# Patient Record
Sex: Male | Born: 1950
Health system: Southern US, Community
[De-identification: ages and names within clinical notes are randomized; demographics above are authoritative.]

## PROBLEM LIST (undated history)

## (undated) DIAGNOSIS — D0359 Melanoma in situ of other part of trunk: Secondary | ICD-10-CM

## (undated) DIAGNOSIS — M545 Low back pain, unspecified: Secondary | ICD-10-CM

## (undated) DIAGNOSIS — T7840XA Allergy, unspecified, initial encounter: Secondary | ICD-10-CM

## (undated) DIAGNOSIS — K648 Other hemorrhoids: Secondary | ICD-10-CM

## (undated) DIAGNOSIS — E039 Hypothyroidism, unspecified: Secondary | ICD-10-CM

## (undated) DIAGNOSIS — R51 Headache: Secondary | ICD-10-CM

## (undated) DIAGNOSIS — D126 Benign neoplasm of colon, unspecified: Secondary | ICD-10-CM

## (undated) DIAGNOSIS — I469 Cardiac arrest, cause unspecified: Secondary | ICD-10-CM

## (undated) DIAGNOSIS — G4730 Sleep apnea, unspecified: Secondary | ICD-10-CM

## (undated) DIAGNOSIS — F32A Depression, unspecified: Secondary | ICD-10-CM

## (undated) DIAGNOSIS — F988 Other specified behavioral and emotional disorders with onset usually occurring in childhood and adolescence: Secondary | ICD-10-CM

## (undated) DIAGNOSIS — E785 Hyperlipidemia, unspecified: Secondary | ICD-10-CM

## (undated) DIAGNOSIS — C801 Malignant (primary) neoplasm, unspecified: Secondary | ICD-10-CM

## (undated) DIAGNOSIS — L501 Idiopathic urticaria: Secondary | ICD-10-CM

## (undated) DIAGNOSIS — K219 Gastro-esophageal reflux disease without esophagitis: Secondary | ICD-10-CM

## (undated) DIAGNOSIS — Z8042 Family history of malignant neoplasm of prostate: Secondary | ICD-10-CM

## (undated) DIAGNOSIS — F0390 Unspecified dementia without behavioral disturbance: Secondary | ICD-10-CM

## (undated) DIAGNOSIS — K5790 Diverticulosis of intestine, part unspecified, without perforation or abscess without bleeding: Secondary | ICD-10-CM

## (undated) DIAGNOSIS — I493 Ventricular premature depolarization: Secondary | ICD-10-CM

## (undated) DIAGNOSIS — Z95810 Presence of automatic (implantable) cardiac defibrillator: Secondary | ICD-10-CM

## (undated) DIAGNOSIS — F329 Major depressive disorder, single episode, unspecified: Secondary | ICD-10-CM

## (undated) HISTORY — DX: Idiopathic urticaria: L50.1

## (undated) HISTORY — DX: Other hemorrhoids: K64.8

## (undated) HISTORY — PX: COLONOSCOPY: SHX174

## (undated) HISTORY — DX: Other specified behavioral and emotional disorders with onset usually occurring in childhood and adolescence: F98.8

## (undated) HISTORY — DX: Presence of automatic (implantable) cardiac defibrillator: Z95.810

## (undated) HISTORY — DX: Family history of malignant neoplasm of prostate: Z80.42

## (undated) HISTORY — DX: Diverticulosis of intestine, part unspecified, without perforation or abscess without bleeding: K57.90

## (undated) HISTORY — DX: Gastro-esophageal reflux disease without esophagitis: K21.9

## (undated) HISTORY — PX: TONSILLECTOMY: SUR1361

## (undated) HISTORY — DX: Hypothyroidism, unspecified: E03.9

## (undated) HISTORY — DX: Allergy, unspecified, initial encounter: T78.40XA

## (undated) HISTORY — DX: Hyperlipidemia, unspecified: E78.5

## (undated) HISTORY — DX: Ventricular premature depolarization: I49.3

## (undated) HISTORY — DX: Low back pain, unspecified: M54.50

## (undated) HISTORY — PX: OTHER SURGICAL HISTORY: SHX169

## (undated) HISTORY — PX: HEMORRHOID SURGERY: SHX153

## (undated) HISTORY — DX: Sleep apnea, unspecified: G47.30

## (undated) HISTORY — DX: Benign neoplasm of colon, unspecified: D12.6

## (undated) HISTORY — DX: Depression, unspecified: F32.A

## (undated) HISTORY — DX: Headache: R51

## (undated) HISTORY — DX: Melanoma in situ of other part of trunk: D03.59

## (undated) HISTORY — DX: Unspecified dementia, unspecified severity, without behavioral disturbance, psychotic disturbance, mood disturbance, and anxiety: F03.90

## (undated) HISTORY — DX: Major depressive disorder, single episode, unspecified: F32.9

## (undated) HISTORY — DX: Malignant (primary) neoplasm, unspecified: C80.1

## (undated) HISTORY — DX: Low back pain: M54.5

## (undated) HISTORY — DX: Cardiac arrest, cause unspecified: I46.9

---

## 1989-03-21 ENCOUNTER — Encounter: Payer: Self-pay | Admitting: Internal Medicine

## 1989-03-21 ENCOUNTER — Encounter: Payer: Self-pay | Admitting: Family Medicine

## 1989-03-21 ENCOUNTER — Encounter (INDEPENDENT_AMBULATORY_CARE_PROVIDER_SITE_OTHER): Payer: Self-pay | Admitting: *Deleted

## 1997-07-05 DIAGNOSIS — I469 Cardiac arrest, cause unspecified: Secondary | ICD-10-CM

## 1997-07-05 HISTORY — DX: Cardiac arrest, cause unspecified: I46.9

## 1997-10-03 ENCOUNTER — Encounter
Admission: RE | Admit: 1997-10-03 | Discharge: 1998-01-01 | Payer: Self-pay | Admitting: Physical Medicine and Rehabilitation

## 1997-11-01 ENCOUNTER — Ambulatory Visit (HOSPITAL_COMMUNITY): Admission: RE | Admit: 1997-11-01 | Discharge: 1997-11-01 | Payer: Self-pay | Admitting: Cardiology

## 1997-12-10 ENCOUNTER — Encounter (HOSPITAL_COMMUNITY): Admission: RE | Admit: 1997-12-10 | Discharge: 1998-03-10 | Payer: Self-pay | Admitting: Cardiology

## 1998-01-31 ENCOUNTER — Ambulatory Visit (HOSPITAL_COMMUNITY): Admission: RE | Admit: 1998-01-31 | Discharge: 1998-01-31 | Payer: Self-pay | Admitting: Cardiology

## 1998-03-05 ENCOUNTER — Encounter (HOSPITAL_COMMUNITY): Admission: RE | Admit: 1998-03-05 | Discharge: 1998-06-03 | Payer: Self-pay | Admitting: Cardiology

## 1998-03-25 ENCOUNTER — Ambulatory Visit (HOSPITAL_COMMUNITY): Admission: RE | Admit: 1998-03-25 | Discharge: 1998-03-25 | Payer: Self-pay | Admitting: Cardiology

## 1999-01-19 ENCOUNTER — Ambulatory Visit (HOSPITAL_COMMUNITY): Admission: RE | Admit: 1999-01-19 | Discharge: 1999-01-19 | Payer: Self-pay | Admitting: Cardiology

## 1999-01-19 ENCOUNTER — Encounter: Payer: Self-pay | Admitting: Cardiology

## 1999-10-05 ENCOUNTER — Observation Stay (HOSPITAL_COMMUNITY): Admission: AD | Admit: 1999-10-05 | Discharge: 1999-10-05 | Payer: Self-pay | Admitting: Internal Medicine

## 2001-02-08 ENCOUNTER — Ambulatory Visit (HOSPITAL_COMMUNITY): Admission: RE | Admit: 2001-02-08 | Discharge: 2001-02-08 | Payer: Self-pay | Admitting: Cardiology

## 2001-02-08 ENCOUNTER — Encounter: Payer: Self-pay | Admitting: Cardiology

## 2002-04-16 ENCOUNTER — Ambulatory Visit (HOSPITAL_BASED_OUTPATIENT_CLINIC_OR_DEPARTMENT_OTHER): Admission: RE | Admit: 2002-04-16 | Discharge: 2002-04-16 | Payer: Self-pay | Admitting: Internal Medicine

## 2002-05-16 ENCOUNTER — Ambulatory Visit (HOSPITAL_COMMUNITY): Admission: RE | Admit: 2002-05-16 | Discharge: 2002-05-16 | Payer: Self-pay | Admitting: Dermatology

## 2002-07-05 HISTORY — PX: CARDIAC DEFIBRILLATOR REMOVAL: SHX1291

## 2002-10-30 ENCOUNTER — Ambulatory Visit (HOSPITAL_COMMUNITY): Admission: RE | Admit: 2002-10-30 | Discharge: 2002-10-30 | Payer: Self-pay | Admitting: Internal Medicine

## 2002-10-30 ENCOUNTER — Encounter: Payer: Self-pay | Admitting: Internal Medicine

## 2002-12-12 ENCOUNTER — Ambulatory Visit (HOSPITAL_COMMUNITY): Admission: RE | Admit: 2002-12-12 | Discharge: 2002-12-12 | Payer: Self-pay | Admitting: Cardiology

## 2002-12-12 ENCOUNTER — Encounter: Payer: Self-pay | Admitting: Cardiology

## 2004-04-02 ENCOUNTER — Ambulatory Visit (HOSPITAL_COMMUNITY): Admission: RE | Admit: 2004-04-02 | Discharge: 2004-04-02 | Payer: Self-pay | Admitting: Cardiology

## 2004-04-02 ENCOUNTER — Encounter (INDEPENDENT_AMBULATORY_CARE_PROVIDER_SITE_OTHER): Payer: Self-pay | Admitting: Cardiology

## 2004-07-23 ENCOUNTER — Ambulatory Visit: Payer: Self-pay | Admitting: Family Medicine

## 2005-07-02 ENCOUNTER — Ambulatory Visit: Payer: Self-pay | Admitting: Family Medicine

## 2005-08-25 ENCOUNTER — Ambulatory Visit: Payer: Self-pay | Admitting: Internal Medicine

## 2005-08-27 ENCOUNTER — Ambulatory Visit: Payer: Self-pay | Admitting: Cardiology

## 2005-09-24 ENCOUNTER — Ambulatory Visit: Payer: Self-pay | Admitting: Internal Medicine

## 2006-02-02 ENCOUNTER — Encounter: Payer: Self-pay | Admitting: Family Medicine

## 2006-02-02 LAB — CONVERTED CEMR LAB: PSA: 0.22 ng/mL

## 2006-02-17 ENCOUNTER — Ambulatory Visit: Payer: Self-pay

## 2006-03-02 ENCOUNTER — Ambulatory Visit: Payer: Self-pay | Admitting: Family Medicine

## 2006-10-03 ENCOUNTER — Ambulatory Visit: Payer: Self-pay | Admitting: Internal Medicine

## 2006-10-31 ENCOUNTER — Telehealth (INDEPENDENT_AMBULATORY_CARE_PROVIDER_SITE_OTHER): Payer: Self-pay | Admitting: Internal Medicine

## 2006-11-07 ENCOUNTER — Ambulatory Visit: Payer: Self-pay

## 2006-11-14 ENCOUNTER — Encounter: Payer: Self-pay | Admitting: Family Medicine

## 2007-03-21 ENCOUNTER — Ambulatory Visit: Payer: Self-pay | Admitting: Internal Medicine

## 2007-08-22 ENCOUNTER — Encounter (INDEPENDENT_AMBULATORY_CARE_PROVIDER_SITE_OTHER): Payer: Self-pay | Admitting: Internal Medicine

## 2007-09-28 ENCOUNTER — Ambulatory Visit: Payer: Self-pay

## 2007-10-09 ENCOUNTER — Telehealth (INDEPENDENT_AMBULATORY_CARE_PROVIDER_SITE_OTHER): Payer: Self-pay | Admitting: Internal Medicine

## 2007-10-11 ENCOUNTER — Ambulatory Visit: Payer: Self-pay | Admitting: Family Medicine

## 2007-10-11 DIAGNOSIS — M545 Low back pain, unspecified: Secondary | ICD-10-CM | POA: Insufficient documentation

## 2007-10-27 ENCOUNTER — Encounter: Payer: Self-pay | Admitting: Family Medicine

## 2007-10-27 DIAGNOSIS — R519 Headache, unspecified: Secondary | ICD-10-CM | POA: Insufficient documentation

## 2007-10-27 DIAGNOSIS — I472 Ventricular tachycardia, unspecified: Secondary | ICD-10-CM | POA: Insufficient documentation

## 2007-10-27 DIAGNOSIS — E785 Hyperlipidemia, unspecified: Secondary | ICD-10-CM | POA: Insufficient documentation

## 2007-10-27 DIAGNOSIS — G4730 Sleep apnea, unspecified: Secondary | ICD-10-CM | POA: Insufficient documentation

## 2007-10-27 DIAGNOSIS — F329 Major depressive disorder, single episode, unspecified: Secondary | ICD-10-CM | POA: Insufficient documentation

## 2007-10-27 DIAGNOSIS — F988 Other specified behavioral and emotional disorders with onset usually occurring in childhood and adolescence: Secondary | ICD-10-CM | POA: Insufficient documentation

## 2007-10-27 DIAGNOSIS — R51 Headache: Secondary | ICD-10-CM | POA: Insufficient documentation

## 2007-10-30 ENCOUNTER — Ambulatory Visit: Payer: Self-pay | Admitting: Family Medicine

## 2007-11-03 DIAGNOSIS — 419620001 Death: Secondary | SNOMED CT | POA: Insufficient documentation

## 2007-11-03 DEATH — deceased

## 2007-11-28 ENCOUNTER — Telehealth (INDEPENDENT_AMBULATORY_CARE_PROVIDER_SITE_OTHER): Payer: Self-pay | Admitting: *Deleted

## 2007-12-04 ENCOUNTER — Encounter: Payer: Self-pay | Admitting: Family Medicine

## 2008-01-09 ENCOUNTER — Encounter: Payer: Self-pay | Admitting: Family Medicine

## 2008-01-09 ENCOUNTER — Telehealth: Payer: Self-pay | Admitting: Family Medicine

## 2008-01-15 ENCOUNTER — Ambulatory Visit: Payer: Self-pay | Admitting: Internal Medicine

## 2008-01-29 ENCOUNTER — Encounter (INDEPENDENT_AMBULATORY_CARE_PROVIDER_SITE_OTHER): Payer: Self-pay | Admitting: *Deleted

## 2008-01-29 ENCOUNTER — Ambulatory Visit (HOSPITAL_COMMUNITY): Admission: RE | Admit: 2008-01-29 | Discharge: 2008-01-29 | Payer: Self-pay | Admitting: Surgery

## 2008-01-29 ENCOUNTER — Encounter (INDEPENDENT_AMBULATORY_CARE_PROVIDER_SITE_OTHER): Payer: Self-pay | Admitting: Surgery

## 2008-02-03 ENCOUNTER — Ambulatory Visit: Payer: Self-pay | Admitting: *Deleted

## 2008-02-03 DIAGNOSIS — L255 Unspecified contact dermatitis due to plants, except food: Secondary | ICD-10-CM | POA: Insufficient documentation

## 2008-02-03 DIAGNOSIS — L501 Idiopathic urticaria: Secondary | ICD-10-CM | POA: Insufficient documentation

## 2008-02-03 DIAGNOSIS — I10 Essential (primary) hypertension: Secondary | ICD-10-CM | POA: Insufficient documentation

## 2008-02-23 ENCOUNTER — Telehealth: Payer: Self-pay | Admitting: Family Medicine

## 2008-03-06 ENCOUNTER — Encounter: Payer: Self-pay | Admitting: Family Medicine

## 2008-03-22 ENCOUNTER — Telehealth: Payer: Self-pay | Admitting: Family Medicine

## 2008-05-03 ENCOUNTER — Encounter: Payer: Self-pay | Admitting: Family Medicine

## 2008-05-03 ENCOUNTER — Telehealth: Payer: Self-pay | Admitting: Family Medicine

## 2008-06-14 ENCOUNTER — Telehealth: Payer: Self-pay | Admitting: Family Medicine

## 2008-07-23 ENCOUNTER — Telehealth: Payer: Self-pay | Admitting: Family Medicine

## 2008-07-25 ENCOUNTER — Telehealth: Payer: Self-pay | Admitting: Family Medicine

## 2008-07-29 ENCOUNTER — Encounter: Payer: Self-pay | Admitting: Family Medicine

## 2008-08-28 ENCOUNTER — Encounter: Payer: Self-pay | Admitting: Internal Medicine

## 2008-09-06 ENCOUNTER — Telehealth: Payer: Self-pay | Admitting: Family Medicine

## 2008-10-03 ENCOUNTER — Telehealth: Payer: Self-pay | Admitting: Family Medicine

## 2008-10-14 ENCOUNTER — Ambulatory Visit: Payer: Self-pay | Admitting: Internal Medicine

## 2008-10-14 ENCOUNTER — Encounter: Payer: Self-pay | Admitting: Internal Medicine

## 2008-10-16 ENCOUNTER — Encounter: Payer: Self-pay | Admitting: Family Medicine

## 2008-10-30 ENCOUNTER — Encounter: Payer: Self-pay | Admitting: Family Medicine

## 2008-10-30 LAB — CONVERTED CEMR LAB
HDL: 60 mg/dL
LDL Cholesterol: 93 mg/dL
Triglycerides: 55 mg/dL

## 2008-11-04 ENCOUNTER — Telehealth: Payer: Self-pay | Admitting: Family Medicine

## 2008-11-18 ENCOUNTER — Ambulatory Visit: Payer: Self-pay | Admitting: Family Medicine

## 2008-11-18 DIAGNOSIS — E039 Hypothyroidism, unspecified: Secondary | ICD-10-CM | POA: Insufficient documentation

## 2008-11-20 ENCOUNTER — Ambulatory Visit: Payer: Self-pay | Admitting: Family Medicine

## 2008-11-20 LAB — CONVERTED CEMR LAB
Free T4: 0.8 ng/dL (ref 0.6–1.6)
T3, Free: 3.5 pg/mL (ref 2.3–4.2)
TSH: 6.34 microintl units/mL — ABNORMAL HIGH (ref 0.35–5.50)

## 2008-12-20 ENCOUNTER — Telehealth: Payer: Self-pay | Admitting: Family Medicine

## 2008-12-24 ENCOUNTER — Telehealth: Payer: Self-pay | Admitting: Family Medicine

## 2009-01-28 ENCOUNTER — Telehealth: Payer: Self-pay | Admitting: Family Medicine

## 2009-02-03 ENCOUNTER — Ambulatory Visit: Payer: Self-pay | Admitting: Internal Medicine

## 2009-02-03 DIAGNOSIS — Z95810 Presence of automatic (implantable) cardiac defibrillator: Secondary | ICD-10-CM | POA: Insufficient documentation

## 2009-03-11 ENCOUNTER — Telehealth: Payer: Self-pay | Admitting: Family Medicine

## 2009-04-14 ENCOUNTER — Telehealth: Payer: Self-pay | Admitting: Family Medicine

## 2009-05-19 ENCOUNTER — Telehealth: Payer: Self-pay | Admitting: Family Medicine

## 2009-06-03 ENCOUNTER — Encounter: Payer: Self-pay | Admitting: Family Medicine

## 2009-06-03 ENCOUNTER — Encounter: Payer: Self-pay | Admitting: Internal Medicine

## 2009-06-05 ENCOUNTER — Encounter (INDEPENDENT_AMBULATORY_CARE_PROVIDER_SITE_OTHER): Payer: Self-pay | Admitting: *Deleted

## 2009-06-05 LAB — CONVERTED CEMR LAB
Free T4: 1.02 ng/dL
T3, Free: 3.5 pg/mL
T3, Total: 113 ng/dL
T4, Total: 5.8 ug/dL
TSH: 6.36 microintl units/mL — ABNORMAL HIGH

## 2009-06-14 ENCOUNTER — Encounter: Payer: Self-pay | Admitting: Internal Medicine

## 2009-06-17 ENCOUNTER — Telehealth: Payer: Self-pay | Admitting: Internal Medicine

## 2009-07-08 ENCOUNTER — Telehealth: Payer: Self-pay | Admitting: Family Medicine

## 2009-07-17 ENCOUNTER — Ambulatory Visit: Payer: Self-pay | Admitting: Internal Medicine

## 2009-07-28 ENCOUNTER — Telehealth: Payer: Self-pay | Admitting: Family Medicine

## 2009-08-14 ENCOUNTER — Telehealth: Payer: Self-pay | Admitting: Family Medicine

## 2009-08-20 ENCOUNTER — Telehealth: Payer: Self-pay | Admitting: Family Medicine

## 2009-08-21 ENCOUNTER — Telehealth: Payer: Self-pay | Admitting: Family Medicine

## 2009-08-25 ENCOUNTER — Encounter: Payer: Self-pay | Admitting: Family Medicine

## 2009-09-29 ENCOUNTER — Telehealth: Payer: Self-pay | Admitting: Family Medicine

## 2009-10-21 ENCOUNTER — Encounter: Payer: Self-pay | Admitting: Internal Medicine

## 2009-10-21 ENCOUNTER — Ambulatory Visit: Payer: Self-pay | Admitting: Cardiovascular Disease

## 2009-10-23 ENCOUNTER — Telehealth: Payer: Self-pay | Admitting: Family Medicine

## 2009-11-05 ENCOUNTER — Encounter: Payer: Self-pay | Admitting: Family Medicine

## 2009-11-05 ENCOUNTER — Encounter: Payer: Self-pay | Admitting: Cardiovascular Disease

## 2009-11-28 ENCOUNTER — Telehealth: Payer: Self-pay | Admitting: Family Medicine

## 2009-12-10 ENCOUNTER — Telehealth: Payer: Self-pay | Admitting: Family Medicine

## 2009-12-16 ENCOUNTER — Ambulatory Visit: Payer: Self-pay | Admitting: Family Medicine

## 2009-12-17 ENCOUNTER — Telehealth: Payer: Self-pay | Admitting: Family Medicine

## 2009-12-17 LAB — CONVERTED CEMR LAB
ALT: 31 units/L (ref 0–53)
AST: 25 units/L (ref 0–37)
Albumin: 4.2 g/dL (ref 3.5–5.2)
Alkaline Phosphatase: 61 units/L (ref 39–117)
Bilirubin, Direct: 0.1 mg/dL (ref 0.0–0.3)
Cholesterol: 217 mg/dL — ABNORMAL HIGH (ref 0–200)
Direct LDL: 146.2 mg/dL
Free T4: 0.83 ng/dL (ref 0.60–1.60)
HDL: 52.4 mg/dL (ref >39.00)
T3, Free: 3.1 pg/mL (ref 2.3–4.2)
TSH: 8.11 microintl units/mL — ABNORMAL HIGH (ref 0.35–5.50)
Total Bilirubin: 0.3 mg/dL (ref 0.3–1.2)
Total CHOL/HDL Ratio: 4
Total Protein: 6.8 g/dL (ref 6.0–8.3)
Triglycerides: 128 mg/dL (ref 0.0–149.0)
VLDL: 25.6 mg/dL (ref 0.0–40.0)

## 2009-12-22 ENCOUNTER — Ambulatory Visit: Payer: Self-pay | Admitting: Cardiovascular Disease

## 2010-01-19 ENCOUNTER — Ambulatory Visit: Payer: Self-pay

## 2010-01-19 ENCOUNTER — Encounter: Payer: Self-pay | Admitting: Family Medicine

## 2010-02-04 ENCOUNTER — Telehealth: Payer: Self-pay | Admitting: Family Medicine

## 2010-03-06 ENCOUNTER — Telehealth: Payer: Self-pay | Admitting: Family Medicine

## 2010-03-12 ENCOUNTER — Ambulatory Visit: Payer: Self-pay | Admitting: Family Medicine

## 2010-03-14 LAB — CONVERTED CEMR LAB
ALT: 41 units/L (ref 0–53)
AST: 33 units/L (ref 0–37)
Albumin: 4.2 g/dL (ref 3.5–5.2)
Alkaline Phosphatase: 54 units/L (ref 39–117)
BUN: 13 mg/dL (ref 6–23)
Bilirubin, Direct: 0.1 mg/dL (ref 0.0–0.3)
CO2: 28 meq/L (ref 19–32)
Calcium: 9.3 mg/dL (ref 8.4–10.5)
Chloride: 104 meq/L (ref 96–112)
Cholesterol: 201 mg/dL — ABNORMAL HIGH (ref 0–200)
Creatinine, Ser: 0.9 mg/dL (ref 0.4–1.5)
Direct LDL: 134.4 mg/dL
Free T4: 0.82 ng/dL (ref 0.60–1.60)
GFR calc non Af Amer: 95.91 mL/min (ref >60)
Glucose, Bld: 90 mg/dL (ref 70–99)
HDL: 50.4 mg/dL (ref >39.00)
PSA: 0.22 ng/mL (ref 0.10–4.00)
Potassium: 4.2 meq/L (ref 3.5–5.1)
Sodium: 139 meq/L (ref 135–145)
T3, Free: 2.9 pg/mL (ref 2.3–4.2)
TSH: 5.17 microintl units/mL (ref 0.35–5.50)
Total Bilirubin: 0.7 mg/dL (ref 0.3–1.2)
Total CHOL/HDL Ratio: 4
Total Protein: 6.7 g/dL (ref 6.0–8.3)
Triglycerides: 136 mg/dL (ref 0.0–149.0)
VLDL: 27.2 mg/dL (ref 0.0–40.0)

## 2010-03-17 ENCOUNTER — Ambulatory Visit: Payer: Self-pay | Admitting: Family Medicine

## 2010-04-08 ENCOUNTER — Encounter: Payer: Self-pay | Admitting: Internal Medicine

## 2010-04-08 ENCOUNTER — Ambulatory Visit: Payer: Self-pay

## 2010-04-13 ENCOUNTER — Telehealth: Payer: Self-pay | Admitting: Family Medicine

## 2010-04-22 ENCOUNTER — Telehealth: Payer: Self-pay | Admitting: Family Medicine

## 2010-05-12 ENCOUNTER — Telehealth: Payer: Self-pay | Admitting: Family Medicine

## 2010-05-21 ENCOUNTER — Telehealth: Payer: Self-pay | Admitting: Family Medicine

## 2010-06-02 ENCOUNTER — Telehealth: Payer: Self-pay | Admitting: Internal Medicine

## 2010-06-05 ENCOUNTER — Encounter: Payer: Self-pay | Admitting: Internal Medicine

## 2010-06-05 ENCOUNTER — Ambulatory Visit: Payer: Self-pay | Admitting: Cardiovascular Disease

## 2010-06-09 ENCOUNTER — Ambulatory Visit: Payer: Self-pay | Admitting: Internal Medicine

## 2010-06-10 ENCOUNTER — Encounter: Payer: Self-pay | Admitting: Internal Medicine

## 2010-06-12 ENCOUNTER — Ambulatory Visit: Payer: Self-pay | Admitting: Family Medicine

## 2010-06-23 ENCOUNTER — Telehealth: Payer: Self-pay | Admitting: Family Medicine

## 2010-06-24 ENCOUNTER — Encounter: Payer: Self-pay | Admitting: Internal Medicine

## 2010-06-24 ENCOUNTER — Ambulatory Visit: Payer: Self-pay | Admitting: Internal Medicine

## 2010-06-25 LAB — CONVERTED CEMR LAB
BUN: 13 mg/dL (ref 6–23)
Basophils Absolute: 0 10*3/uL (ref 0.0–0.1)
Basophils Relative: 0 % (ref 0–1)
CO2: 26 meq/L (ref 19–32)
Calcium: 9.2 mg/dL (ref 8.4–10.5)
Chloride: 103 meq/L (ref 96–112)
Creatinine, Ser: 1.4 mg/dL (ref 0.40–1.50)
Eosinophils Absolute: 0.1 10*3/uL (ref 0.0–0.7)
Eosinophils Relative: 2 % (ref 0–5)
Glucose, Bld: 117 mg/dL — ABNORMAL HIGH (ref 70–99)
HCT: 40.1 % (ref 39.0–52.0)
Hemoglobin: 14 g/dL (ref 13.0–17.0)
INR: 1.05 (ref <1.50)
Lymphocytes Relative: 46 % (ref 12–46)
Lymphs Abs: 3.3 10*3/uL (ref 0.7–4.0)
MCHC: 34.9 g/dL (ref 30.0–36.0)
MCV: 89.5 fL (ref 78.0–100.0)
Monocytes Absolute: 0.7 10*3/uL (ref 0.1–1.0)
Monocytes Relative: 9 % (ref 3–12)
Neutro Abs: 3 10*3/uL (ref 1.7–7.7)
Neutrophils Relative %: 42 % — ABNORMAL LOW (ref 43–77)
Platelets: 321 10*3/uL (ref 150–400)
Potassium: 3.8 meq/L (ref 3.5–5.3)
Prothrombin Time: 13.9 s (ref 11.6–15.2)
RBC: 4.48 M/uL (ref 4.22–5.81)
RDW: 12.5 % (ref 11.5–15.5)
Sodium: 140 meq/L (ref 135–145)
WBC: 7.2 10*3/uL (ref 4.0–10.5)
aPTT: 29 s (ref 24–37)

## 2010-07-01 ENCOUNTER — Ambulatory Visit (HOSPITAL_COMMUNITY)
Admission: RE | Admit: 2010-07-01 | Discharge: 2010-07-01 | Payer: Self-pay | Source: Home / Self Care | Attending: Internal Medicine | Admitting: Internal Medicine

## 2010-07-01 HISTORY — PX: CARDIAC DEFIBRILLATOR PLACEMENT: SHX171

## 2010-07-02 ENCOUNTER — Encounter: Payer: Self-pay | Admitting: Internal Medicine

## 2010-07-10 ENCOUNTER — Encounter: Payer: Self-pay | Admitting: Internal Medicine

## 2010-07-10 ENCOUNTER — Ambulatory Visit: Admission: RE | Admit: 2010-07-10 | Discharge: 2010-07-10 | Payer: Self-pay | Source: Home / Self Care

## 2010-07-17 ENCOUNTER — Telehealth: Payer: Self-pay | Admitting: Family Medicine

## 2010-07-21 ENCOUNTER — Other Ambulatory Visit: Payer: Self-pay | Admitting: Family Medicine

## 2010-07-21 ENCOUNTER — Ambulatory Visit
Admission: RE | Admit: 2010-07-21 | Discharge: 2010-07-21 | Payer: Self-pay | Source: Home / Self Care | Attending: Family Medicine | Admitting: Family Medicine

## 2010-07-21 DIAGNOSIS — K921 Melena: Secondary | ICD-10-CM | POA: Insufficient documentation

## 2010-07-21 LAB — FECAL OCCULT BLOOD, IMMUNOCHEMICAL: Fecal Occult Bld: POSITIVE

## 2010-07-23 ENCOUNTER — Telehealth: Payer: Self-pay | Admitting: Family Medicine

## 2010-07-24 ENCOUNTER — Encounter: Payer: Self-pay | Admitting: Gastroenterology

## 2010-08-04 NOTE — Cardiovascular Report (Signed)
Summary: Office Visit   Office Visit   Imported By: Roderic Ovens 10/22/2009 16:36:56  _____________________________________________________________________  External Attachment:    Type:   Image     Comment:   External Document

## 2010-08-04 NOTE — Progress Notes (Signed)
Summary: needs refill on adderal  Phone Note Refill Request Call back at Home Phone [phone] Message from:  Patient  Refills Requested: Medication #1:  ADDERALL 10 MG  TABS Take 1 tablet by mouth two times a day Please call when ready.  Initial call taken by: Lowella Petties CMA,  January 28, 2009 9:08 AM    Prescriptions: ADDERALL 10 MG  TABS (AMPHETAMINE-DEXTROAMPHETAMINE) Take 1 tablet by mouth two times a day  #60 x 0   Entered and Authorized by:   Kerby Nora MD   Signed by:   Kerby Nora MD on 01/28/2009   Method used:   Print then Give to Patient   RxID:   [id number]   Appended Document: needs refill on adderal Patient Advised. Prescription left at front desk.

## 2010-08-04 NOTE — Progress Notes (Signed)
Summary: Rx Skelaxin  Phone Note Call from Patient Call back at [phone]   Caller: Patient Call For: Everrett Coombe, FNP Summary of Call: Pt called to request a new script for Skelaxin.  Pt has an old rx that you gave him back in 2007 and was getting ready to take this over the weekend and realized that it had expired.  Pt stated that he takes this as needed when his back bothers him..  Pt was advised that he would probably need an appt. to get a new rx for this since you have not seem him for this problem for several years.  Pt declined appt and insisted that I send this note back to you.  [facility] Initial call taken by: Sydell Axon,  October 09, 2007 11:03 AM  Follow-up for Phone Call        please call him, I have not seen himin over a year and need to before refill   ..................................................................Marland KitchenBillie-Lynn Tyler Deis FNP  October 09, 2007 1:30 PM   Additional Follow-up for Phone Call Additional follow up Details #1::        msg left on home answering machine to schedule appt for med refill. Additional Follow-up by: Cooper Render,  October 09, 2007 3:17 PM

## 2010-08-04 NOTE — Progress Notes (Signed)
Summary: adderall   Phone Note Refill Request Call back at Home Phone [phone] Message from:  Patient on February 04, 2010 8:37 AM  Refills Requested: Medication #1:  ADDERALL 10 MG  TABS Take 1 tablet by mouth two times a day  Method Requested: Pick up at Office Initial call taken by: Melody Comas,  February 04, 2010 8:37 AM  Follow-up for Phone Call        patient advised rx ready for pick up Follow-up by: Benny Lennert CMA Duncan Dull),  February 04, 2010 2:39 PM    Prescriptions: ADDERALL 10 MG  TABS (AMPHETAMINE-DEXTROAMPHETAMINE) Take 1 tablet by mouth two times a day  #60 x 0   Entered and Authorized by:   Kerby Nora MD   Signed by:   Kerby Nora MD on 02/04/2010   Method used:   Print then Give to Patient   RxID:   [id number]

## 2010-08-04 NOTE — Procedures (Signed)
Summary: Gastroenterology-FLEX SIG/PATH  Gastroenterology-FLEX SIG/PATH   Imported By: Hortense Ramal CMA 11/22/2008 17:45:14  _____________________________________________________________________  External Attachment:    Type:   Image     Comment:   External Document

## 2010-08-04 NOTE — Cardiovascular Report (Signed)
Summary: Office Visit   Office Visit   Imported By: Roderic Ovens 05/04/2010 13:10:13  _____________________________________________________________________  External Attachment:    Type:   Image     Comment:   External Document

## 2010-08-04 NOTE — Progress Notes (Signed)
Summary: pt pacer is beeping   Phone Note Call from Patient Call back at Home Phone [phone]   Caller: Patient Reason for Call: Talk to Nurse, Talk to Doctor Summary of Call: pt pacer is beeping Initial call taken by: Omer Jack,  June 02, 2010 2:45 PM

## 2010-08-04 NOTE — Cardiovascular Report (Signed)
Summary: Office Visit   Office Visit   Imported By: Roderic Ovens 01/22/2010 15:19:36  _____________________________________________________________________  External Attachment:    Type:   Image     Comment:   External Document

## 2010-08-04 NOTE — Progress Notes (Signed)
Summary: prior Berkley Harvey is needed for adderal  Phone Note From Pharmacy Call back at [facility] Phone [phone] Call back at [phone]   [facility] Summary of Call: Prior auth is needed for adderal, form is on your desk. Can this be done Friday because pt is going out of town next week. Call when done can leave message with wife [name].  Initial call taken by: Lowella Petties,  July 25, 2008 2:04 PM

## 2010-08-04 NOTE — Progress Notes (Signed)
Summary: ADDERALL RX Requested  Phone Note Refill Request Call back at Home Phone [phone] Message from:  Patient on March 22, 2008 9:19 AM  Refills Requested: Medication #1:  ADDERALL 10 MG  TABS Take 1 tablet by mouth two times a day Please call pt when ready for pick up.    Method Requested: Pick up at Office Initial call taken by: Mickle Asper,  March 22, 2008 9:20 AM      Prescriptions: ADDERALL 10 MG  TABS (AMPHETAMINE-DEXTROAMPHETAMINE) Take 1 tablet by mouth two times a day  #60 x 0   Entered and Authorized by:   Kerby Nora MD   Signed by:   Kerby Nora MD on 03/22/2008   Method used:   Print then Give to Patient   RxID:   [id number]

## 2010-08-04 NOTE — Medication Information (Signed)
Summary: [facility] Prior Auth Approval for Adderall from 07/29/08-1/25/  [facility] Prior Auth Approval for Adderall from 07/29/08-07/29/09   Imported By: Beau Fanny 07/30/2008 10:04:50  _____________________________________________________________________  External Attachment:    Type:   Image     Comment:   External Document

## 2010-08-04 NOTE — Progress Notes (Signed)
Summary: fexofenadine refill  Phone Note Refill Request Message from:  Fax from Pharmacy on Nov 28, 2007 11:02 AM  Refills Requested: Medication #1:  FEXOFENADINE HCL 180 MG TABS Take 1 tablet by mouth once a day [phone]  Initial call taken by: Doristine Devoid,  Nov 28, 2007 11:02 AM      Prescriptions: FEXOFENADINE HCL 180 MG TABS (FEXOFENADINE HCL) Take 1 tablet by mouth once a day  #30 x 3   Entered by:   Doristine Devoid   Authorized by:   Gildardo Griffes FNP   Signed by:   Doristine Devoid on 11/28/2007   Method used:   Electronically [facility] to ...       [facility]  [address]  [id number]       [address]       [place], [place]  [address]       Ph: [phone] or [phone]       Fax: [phone]   RxID:   [id number]

## 2010-08-04 NOTE — Progress Notes (Signed)
Summary: Adderall authorization  Phone Note Call from Patient Call back at [phone]   Caller: Patient Call For: Kerby Nora MD Summary of Call: Patient came into office today because he wanted to know the status of his prior authorization.  He had a rep from [employer] on his cell phone and asked if I would speak to them.  I spoke with the rep and I advised her that the authorization form for Adderall had been faxed back to [employer] on 08/15/2009.  She advised me that [employer] generally faxes the paperwork to [facility] but it may take 5-7 business days for [employer] to get the paperwork, she advised that if I call [employer] and give the information over the phone the authorization can be determined right away.  Called [phone] and spoke to a rep who advised me that since questions 5 & 6 were not answered on the form then there was nothing else that I could do.  The case is now in the appeals department and until they make there decision our hands are basically tied.  In the mean time the patient has been without his medication for 2 weeks.  He is a very pleasant gentleman and I explained to him the situation.  He is willing to work with [place] and do whatever he needs to do to get this taken care of.  Please advise. Initial call taken by: Linde Gillis CMA Duncan Dull),  August 20, 2009 1:42 PM  Follow-up for Phone Call        He can pay for med out of pocket to get while in appeal.  I answered the handout to the best of my ability.Marland Kitchengiven I did not initiate this medicaiton..neuro did.  Follow-up by: Kerby Nora MD,  August 20, 2009 3:06 PM  Additional Follow-up for Phone Call Additional follow up Details #1::        Patient advised as instructed.  He is in the process of setting up an appt to see Dr. Sandria Manly who gave him the Rx originally.  He will speak with them at his appt to see if they can do anything to help.  Advised that if our office can do anything to assist then please let us know. Additional Follow-up  by: Linde Gillis CMA Duncan Dull),  August 20, 2009 4:36 PM

## 2010-08-04 NOTE — Letter (Signed)
Summary: [facility] Office Visit Note   [facility] Office Visit Note   Imported By: Roderic Ovens 12/29/2009 15:08:53  _____________________________________________________________________  External Attachment:    Type:   Image     Comment:   External Document

## 2010-08-04 NOTE — Progress Notes (Signed)
Summary: adderall  Phone Note Refill Request Call back at Home Phone [phone] Message from:  Patient on July 28, 2009 10:28 AM  Refills Requested: Medication #1:  ADDERALL 10 MG  TABS Take 1 tablet by mouth two times a day   Supply Requested: 1 month Patient wants to pick up to day   Method Requested: Pick up at Office Initial call taken by: Benny Lennert CMA Duncan Dull),  July 28, 2009 10:28 AM Caller: Patient Call For: Kerby Nora MD Summary of Call: Patient request refill on Adderall. Patient wants to pick up to day  Follow-up for Phone Call        In my box for pick up. Follow-up by: Ruthe Mannan MD,  July 28, 2009 10:44 AM    Prescriptions: ADDERALL 10 MG  TABS (AMPHETAMINE-DEXTROAMPHETAMINE) Take 1 tablet by mouth two times a day  #60 x 0   Entered and Authorized by:   Ruthe Mannan MD   Signed by:   Ruthe Mannan MD on 07/28/2009   Method used:   Print then Give to Patient   RxID:   [id number]   Appended Document: adderall patient advised rx ready for pick up

## 2010-08-04 NOTE — Miscellaneous (Signed)
   Clinical Lists Changes  Observations: Added new observation of T3 FREE: 3.5 pg/mL (06/05/2009 15:02) Added new observation of T3, TOTAL: 113 ng/dL (16/04/9603 54:09) Added new observation of TSH: 6.360 microintl units/mL (06/05/2009 15:02) Added new observation of T4, FREE: 1.02 ng/dL (81/19/1478 29:56) Added new observation of T4, TOTAL: 5.8 mcg/dL (21/30/8657 84:69)

## 2010-08-04 NOTE — Progress Notes (Signed)
Summary: Stool cards  Phone Note Call from Patient Call back at Home Phone [phone]   Caller: Patient Call For: Kerby Nora MD Summary of Call: Patient has decided to do stool cards rather than colonoscopy.  Will  pick up hemoccult set when he picks up Rx. Initial call taken by: Delilah Shan,  December 20, 2008 2:33 PM  Follow-up for Phone Call        order printed on computer for i[employer]. Follow-up by: Kerby Nora MD,  December 20, 2008 3:05 PM  Additional Follow-up for Phone Call Additional follow up Details #1::        Received. Additional Follow-up by: Delilah Shan,  December 20, 2008 3:11 PM  New Problems: SPECIAL SCREENING FOR MALIGNANT NEOPLASMS COLON (ICD-V76.51)   New Problems: SPECIAL SCREENING FOR MALIGNANT NEOPLASMS COLON (ICD-V76.51)

## 2010-08-04 NOTE — Assessment & Plan Note (Signed)
Summary: f21m      Allergies Added:   Visit Type:  Follow-up Referring Provider:  Randolm Idol Primary Provider:  Dr. Ermalene Searing  CC:  none.  History of Present Illness: This is a 60 year old gentleman presenting for follow-up evaluation. The patient is followed by Dr. [name] after treatment with an ICD in 1999. 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 This occurred in the setting of antidysrhythmic therapy for symptomatic PVCs suppression.  Cardiac catheterization at the time of his arrest demonstrated normal coronary arteries. Echocardiogram performed in 2010 showed normal left ventricular function with an LVEF of 60%.  The pt was started on low-dose Toprol XL at the time of his last visit because of tachycardia. He has tolerated this well. The patient denies chest pain, dyspnea, orthopnea, PND, edema, palpitations, lightheadedness, or syncope.   Current Medications (verified): 1)  Fexofenadine Hcl 180 Mg Tabs (Fexofenadine Hcl) .... Take 1 Tablet By Mouth Once A Day 2)  Aricept 5 Mg  Tabs (Donepezil Hcl) .... Take 1 Tab By Mouth At Bedtime 3)  Crestor 10 Mg  Tabs (Rosuvastatin Calcium) .... Take 1 Tab By Mouth Every Day At Bedtime 4)  Taztia Xt 300 Mg  Cp24 (Diltiazem Hcl Er Beads) .... Take 1 Tablet By Mouth Every Morning 5)  Adderall 10 Mg  Tabs (Amphetamine-Dextroamphetamine) .... Take 1 Tablet By Mouth Two Times A Day 6)  Multivitamins   Tabs (Multiple Vitamin) .... Take 1 Tablet By Mouth Once A Day 7)  Ginkgo Biloba   Extr (Ginkgo Biloba) .... Take 1 Tablet By Mouth Two Times A Day 8)  Calcium Carbonate-Vitamin D 600-400 Mg-Unit  Tabs (Calcium Carbonate-Vitamin D) .... Take 1 Tablet By Mouth Once in A While 9)  Fish Oil 1000 Mg  Caps (Omega-3 Fatty Acids) .... Take 1 Tablet By Mouth Every Morning 10)  Osteo Bi-Flex Triple Strength  Tabs (Misc Natural Products) .... 1/2 Tablet Two Times A Day 11)  Skelaxin 800 Mg  Tabs (Metaxalone)  .Marland Kitchen.. 1 Tab By Mouth Three Times A Day As Needed Pain/muscle Spasm 12)  Aspirin 325 Mg  Tabs (Aspirin) .... Take 1 Tablet By Mouth Once A Day 13)  Focus Factor 692mg  .... 1 Tablet Two Times A Day 14)  Vitamin C 500 Mg  Tabs (Ascorbic Acid) .... Take One Tablet By Mouth Once Daily. 15)  Vitamin E 400 Unit Caps (Vitamin E) .... Once Daily 16)  Metoprolol Succinate 25 Mg Xr24h-Tab (Metoprolol Succinate) .... Take One Tablet By Mouth Daily  Allergies (verified): 1)  Penicillin G Potassium (Penicillin G Potassium)  Past History:  Past medical history reviewed for relevance to current acute and chronic problems.  Past Medical History: Reviewed history from 12/13/2008 and no changes required. 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 HEENT: normal Neck: normal carotid upstrokes without bruits, JVP normal Lungs: CTA CV: RRR without murmur or gallop Abd: soft, NT, positive BS, no bruit, no organomegaly Ext: no clubbing, cyanosis, or edema. peripheral pulses 2+ and equal Skin: warm and dry without rash    EKG  Procedure date:  12/22/2009  Findings:      NSR, HR 85 bpm, within normal limits.   ICD  Specifications Following MD:  Sherryl Manges, MD     ICD Vendor:  [facility] [employer]     ICD Model Number:  [phone]     ICD Serial Number:  [id number] ICD DOI:  10/30/2002     ICD Implanting MD:  Sherryl Manges, MD  Lead 1:    Location: RV     DOI: 08/08/1997     Model #: [id number]     Serial #: [id number]     Status: active  Indications::  VT   ICD Follow Up ICD Dependent:  No      Episodes Coumadin:  No  Brady Parameters Mode VVI     Lower Rate Limit:  50      Tachy Zones VF:  210     VT:  185     Impression & Recommendations:  Problem # 1:  UNSPECIFIED TACHYCARDIA (ICD-785.0) The patient is doing well without cardiac symptoms at present. Recommend continue current medical therapy and f/u in 6 months.  Orders: EKG w/ Interpretation (93000)  Problem # 2:  HYPERLIPIDEMIA (ICD-272.4) Recent lipids showed an LDL of 146 mg/dL. We increased his Crestor to daily dosing (was taking every other day) and will recheck lipids in 12 weeks.  His updated medication list for this problem includes:    Crestor 10 Mg Tabs (Rosuvastatin calcium) .Marland Kitchen... Take 1 tab by mouth every day at bedtime  CHOL: 217 (12/16/2009)   LDL: 93 (10/30/2008)   HDL: 52.40 (12/16/2009)   TG: 128.0 (12/16/2009)  Patient Instructions: 1)  Your physician recommends that you continue on your current medications as directed. Please refer to the Current Medication list given to you today. 2)  Your physician wants you to follow-up in:   6 MONTHS with Dr Excell Seltzer. You will receive a reminder letter in the mail two months in advance. If you don't receive a letter, please call our office to schedule the follow-up appointment. 3)  Please have a lipid and liver profile drawn in September with your PCP.  4)  Your physician recommends that you schedule a follow-up appointment: for device check

## 2010-08-04 NOTE — Procedures (Signed)
Summary: pacer beeping/lg    Allergies: 1)  Penicillin G Potassium (Penicillin G Potassium)   ICD Specifications Following MD:  Sherryl Manges, MD     ICD Vendor:  [facility] [employer]     ICD Model Number:  [phone]     ICD Serial Number:  [id number] ICD DOI:  10/30/2002     ICD Implanting MD:  Sherryl Manges, MD  Lead 1:    Location: RV     DOI: 08/08/1997     Model #: [id number]     Serial #: [id number]     Status: active  Indications::  VT   ICD Follow Up Remote Check?  No Battery Voltage:  2.50 V     Charge Time:  14.3 seconds     Battery Est. Longevity:  ERI Underlying rhythm:  SR ICD Dependent:  No      Episodes Coumadin:  No  Brady Parameters Mode VVI     Lower Rate Limit:  50      Tachy Zones VF:  210     VT:  185     Tech Comments:  Battery @ [facility] 05/30/10.  ROV 12/6 with Dr. [name] to discuss change out. Altha Harm, LPN  June 05, 2010 12:32 PM

## 2010-08-04 NOTE — Assessment & Plan Note (Signed)
Summary: 3 WEEK FOLLOW UP / LFW   Vital Signs:  Patient Profile:   60 Years Old Male Weight:      178.13 pounds Temp:     98.2 degrees F oral Pulse rate:   80 / minute Pulse rhythm:   regular BP sitting:   122 / 84  (left arm) Cuff size:   regular  Vitals Entered By: Delilah Shan (October 30, 2007 9:33 AM)                 Chief Complaint:  3 week follow up.  History of Present Illness: hemmorhoid/rectal mass protruding present for years, somewhat irritated more problematic in past few months, better now changes in size, larger when bothering himmore. drops of brbpr on toilet tissue    Current Allergies (reviewed today): PENICILLIN G POTASSIUM (PENICILLIN G POTASSIUM)  Past Medical History:    Reviewed history from 10/07/2007 and no changes required:       MELANOMA, CHEST (ICD-172.9)       ADD (ICD-314.00)       SLEEP APNEA (ICD-780.57)       DEMENTIA, POST RESUSCITATIVE (ICD-294.8)       SUDDEN DEATH (ICD-798.1)       CARDIAC ARRHYTHMIA (ICD-427.9)       HYPOTHYROIDISM (ICD-244.9)       HYPERLIPIDEMIA (ICD-272.4)       HEADACHE (ICD-784.0)       DEPRESSION (ICD-311)       LOW BACK PAIN SYNDROME (ICD-724.2)             Review of Systems  General      Denies fatigue, fever, sweats, and weight loss.  GI      Complains of bloody stools.      Denies abdominal pain, constipation, diarrhea, gas, indigestion, loss of appetite, nausea, vomiting, and vomiting blood.   Physical Exam  General:     Well-developed,well-nourished,in no acute distress; alert,appropriate and cooperative throughout examination Lungs:     Normal respiratory effort, chest expands symmetrically. Lungs are clear to auscultation, no crackles or wheezes. Heart:     Normal rate and regular rhythm. S1 and S2 normal without gallop, murmur, click, rub or other extra sounds. Abdomen:     Bowel sounds positive,abdomen soft and non-tender without masses, organomegaly or hernias noted. Rectal:     2cm elongated rectal mass protruding, appears to originate <1 cm into rectum, no thrombosed hemmorhoid    Impression & Recommendations:  Problem # 1:  RECTAL MASS (ICD-787.99) likely external hemmorhoid tag. Given size and recurrent irritation, refer to surgeon for consult on removal. Orders: Surgical Referral (Surgery) Anoscopy ([address])   Complete Medication List: 1)  Fexofenadine Hcl 180 Mg Tabs (Fexofenadine hcl) .... Take 1 tablet by mouth once a day 2)  Zetia 10 Mg Tabs (Ezetimibe) .... Take 1 tab by mouth at bedtime 3)  Lexapro 10 Mg Tabs (Escitalopram oxalate) .... Take 1 tab by mouth at bedtime 4)  Aricept 5 Mg Tabs (Donepezil hcl) .... Take 1 tab by mouth at bedtime 5)  Niaspan 500 Mg Tbcr (Niacin (antihyperlipidemic)) .... Take 1 tab by mouth at bedtime 6)  Crestor 10 Mg Tabs (Rosuvastatin calcium) .... Take 1 tab by mouth at bedtime 7)  Taztia Xt 300 Mg Cp24 (Diltiazem hcl er beads) .... Take 1 tablet by mouth every morning 8)  Adderall 10 Mg Tabs (Amphetamine-dextroamphetamine) .... Take 1 tablet by mouth two times a day 9)  Multivitamins Tabs (Multiple vitamin) .... Take 1 tablet  by mouth once a day 10)  Acetaminophen 325 Mg Tabs (Acetaminophen) .... Take 1 tablet by mouth once a day 11)  Ginkgo Biloba Extr (Ginkgo biloba) .... Take 1 tablet by mouth two times a day 12)  Calcium Carbonate-vitamin D 600-400 Mg-unit Tabs (Calcium carbonate-vitamin d) .... Take 1 tablet by mouth every morning 13)  Fish Oil 1000 Mg Caps (Omega-3 fatty acids) .... Take 1 tablet by mouth every morning 14)  Flax Oil (Flaxseed (linseed)) .... Take 1 tablet by mouth every morning 15)  Glucosamine-chondroitin Caps (Glucosamine-chondroit-vit c-mn) .... Take 1 tablet by mouth two times a day 16)  Skelaxin 800 Mg Tabs (Metaxalone) .Marland Kitchen.. 1 tab by mouth three times a day as needed pain/muscle spasm   Patient Instructions: 1)  Referral Appointment Information surgeon, rectal mass likely external  hemmorhoid tag, large 2)  Day/Date: 3)  Time: 4)  Place/MD: 5)  Address: 6)  Phone/Fax: 7)  Patient given appointment information. Information/Orders faxed/mailed.     ] Current Allergies (reviewed today): PENICILLIN G POTASSIUM (PENICILLIN G POTASSIUM) Current Medications (including changes made in today's visit):  FEXOFENADINE HCL 180 MG TABS (FEXOFENADINE HCL) Take 1 tablet by mouth once a day ZETIA 10 MG  TABS (EZETIMIBE) Take 1 tab by mouth at bedtime LEXAPRO 10 MG  TABS (ESCITALOPRAM OXALATE) Take 1 tab by mouth at bedtime ARICEPT 5 MG  TABS (DONEPEZIL HCL) Take 1 tab by mouth at bedtime NIASPAN 500 MG  TBCR (NIACIN (ANTIHYPERLIPIDEMIC)) Take 1 tab by mouth at bedtime CRESTOR 10 MG  TABS (ROSUVASTATIN CALCIUM) Take 1 tab by mouth at bedtime TAZTIA XT 300 MG  CP24 (DILTIAZEM HCL ER BEADS) Take 1 tablet by mouth every morning ADDERALL 10 MG  TABS (AMPHETAMINE-DEXTROAMPHETAMINE) Take 1 tablet by mouth two times a day MULTIVITAMINS   TABS (MULTIPLE VITAMIN) Take 1 tablet by mouth once a day ACETAMINOPHEN 325 MG  TABS (ACETAMINOPHEN) Take 1 tablet by mouth once a day GINKGO BILOBA   EXTR (GINKGO BILOBA) Take 1 tablet by mouth two times a day CALCIUM CARBONATE-VITAMIN D 600-400 MG-UNIT  TABS (CALCIUM CARBONATE-VITAMIN D) Take 1 tablet by mouth every morning FISH OIL 1000 MG  CAPS (OMEGA-3 FATTY ACIDS) Take 1 tablet by mouth every morning FLAX   OIL (FLAXSEED (LINSEED)) Take 1 tablet by mouth every morning GLUCOSAMINE-CHONDROITIN   CAPS (GLUCOSAMINE-CHONDROIT-VIT C-MN) Take 1 tablet by mouth two times a day SKELAXIN 800 MG  TABS (METAXALONE) 1 tab by mouth three times a day as needed pain/muscle spasm

## 2010-08-04 NOTE — Procedures (Signed)
Summary: DF2/AMD      Allergies Added:   Current Medications (verified): 1)  Fexofenadine Hcl 180 Mg Tabs (Fexofenadine Hcl) .... Take 1 Tablet By Mouth Once A Day 2)  Aricept 5 Mg  Tabs (Donepezil Hcl) .... Take 1 Tab By Mouth At Bedtime 3)  Crestor 10 Mg  Tabs (Rosuvastatin Calcium) .... Take 1 Tab By Mouth Every Day At Bedtime 4)  Taztia Xt 300 Mg  Cp24 (Diltiazem Hcl Er Beads) .... Take 1 Tablet By Mouth Every Morning 5)  Adderall 10 Mg  Tabs (Amphetamine-Dextroamphetamine) .... Take 1 Tablet By Mouth Two Times A Day 6)  Multivitamins   Tabs (Multiple Vitamin) .... Take 1 Tablet By Mouth Once A Day 7)  Ginkgo Biloba   Extr (Ginkgo Biloba) .... Take 1 Tablet By Mouth Two Times A Day 8)  Calcium Carbonate-Vitamin D 600-400 Mg-Unit  Tabs (Calcium Carbonate-Vitamin D) .... Take 1 Tablet By Mouth Once in A While 9)  Fish Oil 1000 Mg  Caps (Omega-3 Fatty Acids) .... Take 1 Tablet By Mouth Every Morning 10)  Skelaxin 800 Mg  Tabs (Metaxalone) .Marland Kitchen.. 1 Tab By Mouth Three Times A Day As Needed Pain/muscle Spasm 11)  Aspirin 325 Mg  Tabs (Aspirin) .... Take 1 Tablet By Mouth Once A Day 12)  Focus Factor 692mg  .... 1 Tablet Two Times A Day 13)  Vitamin C 500 Mg  Tabs (Ascorbic Acid) .... Take One Tablet By Mouth Once Daily. 14)  Vitamin E 400 Unit Caps (Vitamin E) .... Once Daily 15)  Metoprolol Succinate 25 Mg Xr24h-Tab (Metoprolol Succinate) .... Take One Tablet By Mouth Daily  Allergies (verified): 1)  Penicillin G Potassium (Penicillin G Potassium)   ICD Specifications Following MD:  Sherryl Manges, MD     ICD Vendor:  [facility] [employer]     ICD Model Number:  [phone]     ICD Serial Number:  [id number] ICD DOI:  10/30/2002     ICD Implanting MD:  Sherryl Manges, MD  Lead 1:    Location: RV     DOI: 08/08/1997     Model #: [id number]     Serial #: [id number]     Status: active  Indications::  VT   ICD Follow Up Battery Voltage:  2.55 V     Charge Time:  14.1 seconds     Underlying rhythm:  SR ICD  Dependent:  No       ICD Device Measurements Right Ventricle:  Amplitude: 9.8 mV, Impedance: 705 ohms, Threshold: 1.6 V at 0.8 msec Shock Impedance: 51 ohms   Episodes MS Episodes:  0     Coumadin:  No Shock:  0     ATP:  0     Nonsustained:  0     Atrial Therapies:  0 Ventricular Pacing:  2%  Brady Parameters Mode VVI     Lower Rate Limit:  50      Tachy Zones VF:  210     VT:  185     Tech Comments:  NORMAL DEVICE FUNCTION.  NO CHANGES MADE.  ROV IN 3 MTHS W/CLINIC. Vella Kohler  January 19, 2010 10:17 AM

## 2010-08-04 NOTE — Progress Notes (Signed)
Summary: needs adderal refilled  Phone Note Call from Patient Call back at Home Phone [phone]   Caller: Patient Call For: dr Ermalene Searing Summary of Call: Pt needs refill on adderal, please call when ready. Initial call taken by: Lowella Petties,  May 03, 2008 9:02 AM  Follow-up for Phone Call        Patient Advised. Prescription left at front desk.  Follow-up by: Delilah Shan,  May 03, 2008 9:38 AM      Prescriptions: ADDERALL 10 MG  TABS (AMPHETAMINE-DEXTROAMPHETAMINE) Take 1 tablet by mouth two times a day  #60 x 0   Entered and Authorized by:   Kerby Nora MD   Signed by:   Kerby Nora MD on 05/03/2008   Method used:   Print then Give to Patient   RxID:   [id number]

## 2010-08-04 NOTE — Progress Notes (Signed)
Summary: adderall  rx  Phone Note Outgoing Call   Call placed by: Cooper Render,  January 09, 2008 9:43 AM Call placed to: Patient Summary of Call: phone call to pt, ? takes singulair?  yes, takes seasonally, but not in winter.  pt is on adderall rx'd by Dr. Sandria Manly.  ? would you consider rx'ing so he won''t have to drive into [facility]?  pt's ph # is [phone].  Initial call taken by: Cooper Render,  January 09, 2008 9:46 AM  Follow-up for Phone Call        Yes, I have done so in past monthly at a time Follow-up by: Kerby Nora MD,  January 09, 2008 1:58 PM  Additional Follow-up for Phone Call Additional follow up Details #1::        Patient Advised. Prescription left at front desk.  Additional Follow-up by: Delilah Shan,  January 10, 2008 10:46 AM      Prescriptions: ADDERALL 10 MG  TABS (AMPHETAMINE-DEXTROAMPHETAMINE) Take 1 tablet by mouth two times a day  #60 x 0   Entered and Authorized by:   Kerby Nora MD   Signed by:   Kerby Nora MD on 01/09/2008   Method used:   Print then Give to Patient   RxID:   [id number]

## 2010-08-04 NOTE — Progress Notes (Signed)
Summary: adderall   Phone Note Refill Request Call back at Home Phone [phone] Message from:  Patient on May 12, 2010 10:45 AM  Refills Requested: Medication #1:  ADDERALL XR 10 MG XR24H-CAP 1 tab by mouth daily Patient says that taking one a day is not helping as well as taking two a day like before. He is aksing for 60 instead of 30.    Method Requested: Pick up at Office Initial call taken by: Melody Comas,  May 12, 2010 10:45 AM  Follow-up for Phone Call        Please call pharm..do they have short acting form of adderall so we can go back to two times a day dosing?  Follow-up by: Kerby Nora MD,  May 12, 2010 11:19 AM  Additional Follow-up for Phone Call Additional follow up Details #1::        Patient advised and we will only do the 30 he says its not working as well but, he will try it for a little while longer and if this is continueing will give [place] a call back Additional Follow-up by: Benny Lennert CMA Duncan Dull),  May 12, 2010 11:25 AM    Additional Follow-up for Phone Call Additional follow up Details #2::    Please answer my question about whter pharm has adderall XR.  I did not make any recommendations yet. What was that last note?!?   Follow-up by: Kerby Nora MD,  May 12, 2010 12:18 PM  Additional Follow-up for Phone Call Additional follow up Details #3:: Details for Additional Follow-up Action Taken: adderall in on back order until january that is why he is on adderall xr Benny Lennert CMA Duncan Dull),  May 12, 2010 1:25 PM  Let pt know that since STILL on back order.. we can try to get better control by increasing to 15 mg daily...cannot take this two times a day or he will not be able to sleep because it will last into night. When he calls for next refill 11/20.Marland Kitchenwill send in 15 mg dose.  Additional Follow-up by: Kerby Nora MD,  May 12, 2010 1:35 PM  New/Updated Medications: ADDERALL XR 15 MG XR24H-CAP  (AMPHETAMINE-DEXTROAMPHETAMINE) Take 1 tablet by mouth once a day  once a day    Patient advised.Consuello Masse CMA

## 2010-08-04 NOTE — Miscellaneous (Signed)
  Clinical Lists Changes  Observations: Added new observation of NUCST CONC: PVCs, no sig ST change, area of mild decreased motion in distal anterior wall, no obvious ischemia (11/07/2006 18:23)     Nuclear ETT  Procedure date:  11/07/2006  Findings:      PVCs, no sig ST change, area of mild decreased motion in distal anterior wall, no obvious ischemia  Patient Advised per comment section.

## 2010-08-04 NOTE — Assessment & Plan Note (Signed)
Summary: [facility]       21  22  23  24  25    Allergies: 1)  Penicillin G Potassium (Penicillin G Potassium)   ICD Specifications ICD Vendor:  [employer]     ICD Model Number:  1860     ICD Serial Number:  [id number] ICD DOI:  10/30/2002     ICD Implanting MD:  Sherryl Manges, MD  Lead 1:    Location: RV     DOI: 08/08/1997     Model #: [id number]     Serial #: [id number]     Status: active  Indications::  VT   ICD Specs Remote Check?  No Battery Voltage:  2.58 V     Charge Time:  13.8 seconds     Underlying rhythm:  SR@90  ICD Dependent:  No       ICD Device Measurements Atrium:  Right Ventricle:  Amplitude: 9.9 mV, Impedance: 725 ohms, Threshold: 1.8 V at .8 msec Left Ventricle:  Shock Impedance: 52 ohms   Episodes Shock:  0     ATP:  0     Nonsustained:  1      Brady Parameters Mode VVI     Lower Rate Limit:  50      Tachy Zones VF:  210     VT:  185     Next Cardiology Appt Due:  01/02/2009

## 2010-08-04 NOTE — Progress Notes (Signed)
Summary: PHONE NOTE  Medications Added FEXOFENADINE HCL 180 MG TABS (FEXOFENADINE HCL)  TAMIFLU 75 MG CAPS (OSELTAMIVIR PHOSPHATE) Take 1 capsule by mouth once a day SINGULAIR 10 MG TABS (MONTELUKAST SODIUM)       Allergies Added: PENICILLIN G POTASSIUM (PENICILLIN G POTASSIUM) Phone Note Call from Patient Call back at Home Phone [phone]   Caller: Patient Call For: Sharlisa Hollifield Summary of Call: PT GETS ADDERALL 10 MG BID  FROM NEURO DR LOVE HE WANTS YOU TO START WRITING RX SO HE WON'T HAVE TO TRAVEL TO [place] FOR EACH RX,  Initial call taken by: Liane Comber,  October 31, 2006 4:50 PM  Follow-up for Phone Call        Sorry, but he needs to get the Adderall through Dr. Sandria Manly Follow-up by: Gildardo Griffes FNP,  November 01, 2006 2:13 PM  New/Updated Medications: FEXOFENADINE HCL 180 MG TABS (FEXOFENADINE HCL)  TAMIFLU 75 MG CAPS (OSELTAMIVIR PHOSPHATE) Take 1 capsule by mouth once a day SINGULAIR 10 MG TABS (MONTELUKAST SODIUM)  New Allergies: PENICILLIN G POTASSIUM (PENICILLIN G POTASSIUM) New/Updated Medications: FEXOFENADINE HCL 180 MG TABS (FEXOFENADINE HCL)  TAMIFLU 75 MG CAPS (OSELTAMIVIR PHOSPHATE) Take 1 capsule by mouth once a day SINGULAIR 10 MG TABS (MONTELUKAST SODIUM)  New Allergies: PENICILLIN G POTASSIUM (PENICILLIN G POTASSIUM)

## 2010-08-04 NOTE — Progress Notes (Signed)
Summary: adderall on back order  Phone Note Call from Patient Call back at Home Phone [phone]   Caller: Patient Call For: Kerby Nora MD Summary of Call: [facility] and was told that they will not have any in the rest of the year due to it being on back order.  They told him that they have it in extended release. Patient is asking if you want him to try something else or if he could do the extended release. If so he will need a new script.  Initial call taken by: Melody Comas,  April 22, 2010 3:38 PM  Follow-up for Phone Call        yes okay to change but just take one in AM. Follow-up by: Kerby Nora MD,  April 23, 2010 8:27 AM  Additional Follow-up for Phone Call Additional follow up Details #1::        patients wife advised.Consuello Masse CMA   Additional Follow-up by: Benny Lennert CMA Duncan Dull),  April 23, 2010 9:05 AM    New/Updated Medications: ADDERALL XR 10 MG XR24H-CAP (AMPHETAMINE-DEXTROAMPHETAMINE) 1 tab by mouth daily Prescriptions: ADDERALL XR 10 MG XR24H-CAP (AMPHETAMINE-DEXTROAMPHETAMINE) 1 tab by mouth daily  #30 x 0   Entered and Authorized by:   Kerby Nora MD   Signed by:   Kerby Nora MD on 04/23/2010   Method used:   Print then Give to Patient   RxID:   [id number]

## 2010-08-04 NOTE — Assessment & Plan Note (Signed)
Summary: ec6  Medications Added OSTEO BI-FLEX TRIPLE STRENGTH  TABS (MISC NATURAL PRODUCTS) 1/2 tablet two times a day SKELAXIN 800 MG  TABS (METAXALONE) 1 tab by mouth three times a day as needed pain/muscle spasm * FOCUS FACTOR 692MG  1 tablet two times a day VITAMIN C 500 MG  TABS (ASCORBIC ACID) Take one tablet by mouth once daily. VITAMIN E 400 UNIT CAPS (VITAMIN E) once daily METOPROLOL SUCCINATE 25 MG XR24H-TAB (METOPROLOL SUCCINATE) Take one tablet by mouth daily      Allergies Added:   Visit Type:  Follow-up Referring Provider:  Randolm Idol Primary Provider:  Dr. Ermalene Searing   History of Present Illness: This is a 60 year old gentleman presenting to establish primary cardiac care following retirement of his previous cardiologist, Dr. Elsie Lincoln. The patient is followed by Dr. [name] after treatment with an ICD in 1999. 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 This occurred in the setting of antidysrhythmic therapy for symptomatic PVCs suppression.  Cardiac catheterization at the time of his arrest demonstrated normal coronary arteries. Echocardiogram last year at [facility] showed normal left ventricular function with an LVEF of 60%.  Currently he is doing fairly well from a cardiovascular standpoint. He complains of chronic fatigue but relates this to his neurologic deficit. He denies chest pain, dyspnea, palpitations, lightheadedness, or syncope.  Interrogation of his ICD today demonstrated average heart rates above 100 beats per minute.  Current Medications (verified): 1)  Fexofenadine Hcl 180 Mg Tabs (Fexofenadine Hcl) .... Take 1 Tablet By Mouth Once A Day 2)  Aricept 5 Mg  Tabs (Donepezil Hcl) .... Take 1 Tab By Mouth At Bedtime 3)  Crestor 10 Mg  Tabs (Rosuvastatin Calcium) .... Take 1 Tab By Mouth Every Other Day At Bedtime 4)  Taztia Xt 300 Mg  Cp24 (Diltiazem Hcl Er Beads) .... Take 1 Tablet By Mouth  Every Morning 5)  Adderall 10 Mg  Tabs (Amphetamine-Dextroamphetamine) .... Take 1 Tablet By Mouth Two Times A Day 6)  Multivitamins   Tabs (Multiple Vitamin) .... Take 1 Tablet By Mouth Once A Day 7)  Ginkgo Biloba   Extr (Ginkgo Biloba) .... Take 1 Tablet By Mouth Two Times A Day 8)  Calcium Carbonate-Vitamin D 600-400 Mg-Unit  Tabs (Calcium Carbonate-Vitamin D) .... Take 1 Tablet By Mouth Once in A While 9)  Fish Oil 1000 Mg  Caps (Omega-3 Fatty Acids) .... Take 1 Tablet By Mouth Every Morning 10)  Osteo Bi-Flex Triple Strength  Tabs (Misc Natural Products) .... 1/2 Tablet Two Times A Day 11)  Skelaxin 800 Mg  Tabs (Metaxalone) .Marland Kitchen.. 1 Tab By Mouth Three Times A Day As Needed Pain/muscle Spasm 12)  Aspirin 325 Mg  Tabs (Aspirin) .... Take 1 Tablet By Mouth Once A Day 13)  Focus Factor 692mg  .... 1 Tablet Two Times A Day 14)  Vitamin C 500 Mg  Tabs (Ascorbic Acid) .... Take One Tablet By Mouth Once Daily. 15)  Vitamin E 400 Unit Caps (Vitamin E) .... Once Daily  Allergies (verified): 1)  Penicillin G Potassium (Penicillin G Potassium)  Past History:  Past medical, surgical, family and social histories (including risk factors) reviewed, and no changes noted (except as noted below).  Past Medical History: Reviewed history from 12/13/2008 and no changes required. 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 10/30/02 Defibrillative change [name])   hemorrhoidectomy  Implantable cardioverter-defibrillator  implantation Tonsillectomy  Family History: Reviewed history from 10/16/2009 and no changes required. HTN  - mother CAD - father  Social History: Reviewed history from 10/16/2009 and no changes required. married 2 daughters 4 grandchildren and one on the way Pt. has a history of smoking 1 pack per day for 10 years, but quit 1994  Review of Systems       Negative except as per HPI   Vital Signs:  Patient profile:   60 year old male Height:      70 inches Weight:      178 pounds BMI:     25.63 Pulse rate:   100 / minute Resp:     16 per minute BP sitting:   122 / 80  (left arm)  Vitals Entered By: Laurance Flatten CMA (October 21, 2009 10:01 AM)  Physical Exam  General:  Pt is alert and oriented, in no acute distress. HEENT: normal Neck: normal carotid upstrokes without bruits, JVP normal Lungs: CTA CV: RRR without murmur or gallop Abd: soft, NT, positive BS, no bruit, no organomegaly Ext: no clubbing, cyanosis, or edema. peripheral pulses 2+ and equal Skin: warm and dry without rash     ICD Specifications Following MD:  Sherryl Manges, MD     ICD Vendor:  [facility] [employer]     ICD Model Number:  [id number]     ICD Serial Number:  [id number] ICD DOI:  10/30/2002     ICD Implanting MD:  Sherryl Manges, MD  Lead 1:    Location: RV     DOI: 08/08/1997     Model #: [id number]     Serial #: [id number]     Status: active  Indications::  VT   ICD Follow Up Remote Check?  No Battery Voltage:  2.56 V     Charge Time:  13.8 seconds     Battery Est. Longevity:  MOL2 Underlying rhythm:  SR ICD Dependent:  No       ICD Device Measurements Right Ventricle:  Amplitude: 9.5 mV, Impedance: 705 ohms, Threshold: 1.4 V at 0.8 msec Shock Impedance: 51 ohms   Episodes Coumadin:  No Shock:  0     ATP:  0     Nonsustained:  0     Ventricular Pacing:  1%  Brady Parameters Mode VVI     Lower Rate Limit:  50      Tachy Zones VF:  210     VT:  185     Next Cardiology Appt Due:  01/02/2010 Tech Comments:   No parameter changes.  Device function normal.  Checked by industry.  ROV 3 months in [place]. Altha Harm, LPN  October 21, 2009 10:36 AM   Impression & Recommendations:  Problem # 1:  UNSPECIFIED TACHYCARDIA (ICD-785.0) The patient appears stable from a cardiovascular perspective. His heart rate is greater than 150% of the time by ICD interrogation. Will add metoprolol succinate 25 mg at bedtime and recheck him in one month. He is on Adderall which may be contributing to this. He and his wife tell me that he's had a great deal of improvement from a neuro cognitive standpoint on Adderall and I think the risk benefit is clearly in favor of continuing that medication.  Problem # 2:  HYPERLIPIDEMIA (ICD-272.4) Recently followed by Dr. Elsie Lincoln. Will followup lipids and LFT for some time.as been on every other day Crestor for some time. The following medications were removed from the medication list:    Zetia 10 Mg Tabs (Ezetimibe) .Marland Kitchen... Take one tablet by mouth daily. His updated medication list for this problem includes:    Crestor 10 Mg Tabs (Rosuvastatin calcium) .Marland Kitchen... Take 1 tab by mouth every other day at bedtime  LDL: 93 (10/30/2008)   HDL: 60 (10/30/2008)   TG: 55 (10/30/2008)  Patient Instructions: 1)  Your physician recommends that you schedule a follow-up appointment in: 1 MONTH 2)  Your physician recommends that you return for a FASTING LIPID, LIVER, TSH, FREE T4 and FREE T3 ([place] office---272.0,785.0 )  3)  Your physician has recommended you make the following change in your medication: START Metoprolol Succinate 25mg  one tablet at bedtime 4)  You are due to follow-up with Gunnar Fusi in the [place] office in 3 MONTHS for a device check Prescriptions: METOPROLOL SUCCINATE 25 MG XR24H-TAB (METOPROLOL SUCCINATE) Take one tablet by mouth daily  #30 x 3   Entered by:   Julieta Gutting, RN, BSN   Authorized by:   Norva Karvonen, MD   Signed by:   Julieta Gutting, RN, BSN on  10/21/2009   Method used:   [facility]        [facility]  [address]ret[facility]       [address]       [place], [place]  [address]       Ph: [phone] or [phone]       Fax: [phone]   RxID:   [id number]

## 2010-08-04 NOTE — Assessment & Plan Note (Signed)
Summary: CPX/CLE   Vital Signs:  Patient profile:   60 year old male Height:      70 inches Weight:      180.8 pounds BMI:     26.04 Temp:     98.3 degrees F oral Pulse rate:   80 / minute Pulse rhythm:   regular BP sitting:   90 / 60  (left arm) Cuff size:   regular  Vitals Entered By: Benny Lennert CMA Duncan Dull) (June 12, 2010 2:33 PM)  Vision Screening:      Vision Comments: Patient wears glasses  Vision Entered By: Benny Lennert CMA Duncan Dull) (June 12, 2010 2:41 PM)  Hearing Screen  20db HL: Left  Right  Audiometry Comment: can hear a whisper from across the room   Hearing Testing Entered By: Benny Lennert CMA Duncan Dull) (June 12, 2010 2:41 PM)   History of Present Illness: Chief complaint cpx  I have personally reviewed the Medicare Annual Wellness questionnaire and have noted 1.   The patient's medical and social history 2.   Their use of alcohol, tobacco or illicit drugs 3.   Their current medications and supplements 4.   The patient's functional ability including ADL's, fall risks, home safety risks and hearing or visual             impairment. 5.   Diet and physical activities 6.   Evidence for depression or mood disorders  The patients weight, height, BMI and visual acuity have been recorded in the chart I have made referrals, counseling and provided education to the patient based review of the above and I have provided the pt with a written personalized care plan for preventive services.   60 year old male with hx of sudden death..resulting in mental impairment. Uable to drive, decreased memory..wife helps him with mot things. Other ADLs stable. Cardiac arrythmia..has defib in place.  Recent visit with Cardiologist.. Dr. Excell Seltzer and [name].  On adderal for ADD from brain injury.. has appt to see Dr. Sandria Manly in January.   Recent chol and DM screen  at cardiologist was at  NOT goal <130  LFTs nml.  On crestor 10 mg .. was increased by cards    Subclinical hypothyroidism..stable with T3 and T4 in nml range.  He does have some fatigue.   PSA stable.   Preventive Screening-Counseling & Management  Alcohol-Tobacco     Smoking Status: quit > 6 months     Smoking Cessation Counseling: no  Caffeine-Diet-Exercise     Diet Comments: healthy varied diet, some fruit  aad veggies     Diet Counseling: not indicated; diet is assessed to be healthy     Does Patient Exercise: no     Exercise Counseling: to improve exercise regimen  Problems Prior to Update: 1)  Special Screening Malignant Neoplasm of Prostate  (ICD-V76.44) 2)  Unspecified Tachycardia  (ICD-785.0) 3)  Automatic Implantable Cardiac Defibrillator Situ  (ICD-V45.02) 4)  Special Screening For Malignant Neoplasms Colon  (ICD-V76.51) 5)  Sudden Death  (ICD-798.1) 6)  Cardiac Arrhythmia  (ICD-427.9) 7)  Hyperlipidemia  (ICD-272.4) 8)  Elevated Bp Reading Without Dx Hypertension  (ICD-796.2) 9)  Contact Dermatitis&other Eczema Due To Plants  (ICD-692.6) 10)  Idiopathic Urticaria  (ICD-708.1) 11)  Melanoma, Chest  (ICD-172.9) 12)  Add  (ICD-314.00) 13)  Sleep Apnea  (ICD-780.57) 14)  Dementia, 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 Take 1 Tablet By Mouth Once A Day 2)  Aricept 5 Mg  Tabs (Donepezil Hcl) .... Take 1 Tab By Mouth At Bedtime 3)  Crestor 10 Mg  Tabs (Rosuvastatin Calcium) .... Take 1 Tab By Mouth Every Day At Bedtime 4)  Taztia Xt 300 Mg  Cp24 (Diltiazem Hcl Er Beads) .... Take 1 Tablet By Mouth Every Morning 5)  Adderall Xr 15 Mg Xr24h-Cap (Amphetamine-Dextroamphetamine) .... Take 1 Tablet By Mouth Once A Day 6)  Multivitamins   Tabs (Multiple Vitamin) .... Take 1 Tablet By Mouth Once A Day 7)  Ginkgo  Biloba   Extr (Ginkgo Biloba) .... Take 1 Tablet By Mouth Two Times A Day 8)  Calcium Carbonate-Vitamin D 600-400 Mg-Unit  Tabs (Calcium Carbonate-Vitamin D) .... Take 1 Tablet By Mouth Once in A While 9)  Fish Oil 1000 Mg  Caps (Omega-3 Fatty Acids) .... Take 1 Tablet By Mouth Every Morning 10)  Aspirin 325 Mg  Tabs (Aspirin) .... Take 1 Tablet By Mouth Once A Day 11)  Focus Factor 692mg  .... 1 Tablet Two Times A Day 12)  Vitamin C 500 Mg  Tabs (Ascorbic Acid) .... Take One Tablet By Mouth Once Daily. 13)  Vitamin E 400 Unit Caps (Vitamin E) .... Once Daily 14)  Metoprolol Succinate 25 Mg Xr24h-Tab (Metoprolol Succinate) .... Take One Tablet By Mouth Daily  Allergies: 1)  Penicillin G Potassium (Penicillin G Potassium)  Past History:  Past medical, surgical, family and social histories (including risk factors) reviewed, and no changes noted (except as noted below).  Past Medical History: Reviewed history from 12/13/2008 and no changes required. SUDDEN DEATH (ICD-798.1) CARDIAC ARRHYTHMIA (ICD-427.9) HYPERLIPIDEMIA (ICD-272.4) ELEVATED BP READING WITHOUT DX HYPERTENSION (ICD-796.2) CONTACT DERMATITIS&OTHER ECZEMA DUE TO PLANTS (ICD-692.6) IDIOPATHIC URTICARIA (ICD-708.1) MELANOMA, CHEST (ICD-172.9) ADD (ICD-314.00) SLEEP APNEA (ICD-780.57) DEMENTIA, POST RESUSCITATIVE (ICD-294.8) HYPOTHYROIDISM (ICD-244.9) HEADACHE (ICD-784.0) DEPRESSION (ICD-311) LOW BACK PAIN SYNDROME (ICD-724.2)  Past Surgical History: Reviewed history from 10/16/2009 and no changes required. 10/30/02 Defibrillative change [name])   hemorrhoidectomy  Implantable cardioverter-defibrillator implantation Tonsillectomy  Family History: Reviewed history from 10/16/2009 and no changes required. HTN  - mother CAD - father  Social History: Reviewed history from 10/16/2009 and no changes required. married 2 daughters 4 grandchildren and one on the way Pt. has a history of smoking 1 pack per day for 10  years, but quit 1994 Smoking Status:  quit > 6 months Does Patient Exercise:  no  Physical Exam  General:  Well-developed,well-nourished,in no acute distress; alert,appropriate and cooperative throughout examination Ears:  External ear exam shows no significant lesions or deformities.  Otoscopic examination reveals clear canals, tympanic membranes are intact bilaterally without bulging, retraction, inflammation or discharge. Hearing is grossly normal bilaterally. Nose:  External nasal examination shows no deformity or inflammation. Nasal mucosa are pink and moist without lesions or exudates. Mouth:  Oral mucosa and oropharynx without lesions or exudates.  Teeth in good repair. MMM Neck:  no carotid bruit or thyromegaly  no cervical or supraclavicular lymphadenopathy  Lungs:  Normal respiratory effort, chest expands symmetrically. Lungs are clear to auscultation, no crackles or wheezes. Heart:  Normal rate and regular rhythm. S1 and S2 normal without gallop, murmur, click, rub or other extra sounds. Abdomen:  Bowel sounds positive,abdomen soft and non-tender without masses, organomegaly or hernias noted. Rectal:  No external abnormalities noted.  Normal sphincter tone. No rectal masses or tenderness. Genitalia:  Testes bilaterally descended without nodularity, tenderness or masses. No scrotal masses or lesions. No penis lesions or urethral discharge. Prostate:  Prostate gland firm and smooth, no enlargement, nodularity, tenderness, mass, asymmetry or induration. Msk:  No deformity or scoliosis noted of thoracic or lumbar spine.   Pulses:  R and L posterior tibial pulses are full and equal bilaterally  Extremities:  no edmea  Skin:  Intact without suspicious lesions or rashes Psych:  Cognition and judgment appear intact. Alert and cooperative with normal attention span and concentration. No apparent delusions, illusions, hallucinations   Impression & Recommendations:  Problem # 1:  Preventive  Health Care (ICD-V70.0)  The patient's preventative maintenance and recommended screening tests for an annual wellness exam were reviewed in full today. Brought up to date unless services declined.  Counselled on the importance of diet, exercise, and its role in overall health and mortality. The patient's FH and SH was reviewed, including their home life, tobacco status, and drug and alcohol status.     Orders: Medicare -1st Annual Wellness Visit [phone])  Problem # 2:  CARDIAC ARRHYTHMIA (ICD-427.9) Assessment: Unchanged Per cards His updated medication list for this problem includes:    Aspirin 325 Mg Tabs (Aspirin) .Marland Kitchen... Take 1 tablet by mouth once a day    Metoprolol Succinate 25 Mg Xr24h-tab (Metoprolol succinate) .Marland Kitchen... Take one tablet by mouth daily  Problem # 3:  HYPERLIPIDEMIA (ICD-272.4) Improved control but not at goal LDL <130. COntinue current dose of crestor and lifestyle change. Recheck fasting LIPIDS, AST, ALT  in 6 months Dx 272.0    His updated medication list for this problem includes:    Crestor 10 Mg Tabs (Rosuvastatin calcium) .Marland Kitchen... Take 1 tab by mouth every day at bedtime  Problem # 4:  ELEVATED BP READING WITHOUT DX HYPERTENSION (ICD-796.2) Well controlled at this visit and past cardiology visits. His updated medication list for this problem includes:    Taztia Xt 300 Mg Cp24 (Diltiazem hcl er beads) .Marland Kitchen... Take 1 tablet by mouth every morning    Metoprolol Succinate 25 Mg Xr24h-tab (Metoprolol succinate) .Marland Kitchen... Take one tablet by mouth daily  BP today: 90/60 Prior BP: 110/78 (06/09/2010)  Labs Reviewed: Creat: 0.9 (03/12/2010) Chol: 201 (03/12/2010)   HDL: 50.40 (03/12/2010)   LDL: 93 (10/30/2008)   TG: 136.0 (03/12/2010)  Instructed in low sodium diet (DASH Handout) and behavior modification.    Problem # 5:  ADD (ICD-314.00) Assessment: Deteriorated Poor control...on adderall XR despite higher dose... will discuss with Neurologist.   Problem # 6:   HYPOTHYROIDISM (ICD-244.9) Assessment: Unchanged  Complete Medication List: 1)  Fexofenadine Hcl 180 Mg Tabs (Fexofenadine hcl) .... Take 1 tablet by mouth once a day 2)  Aricept 5 Mg Tabs (Donepezil hcl) .... Take 1 tab by mouth at bedtime 3)  Crestor 10 Mg Tabs (Rosuvastatin calcium) .... Take 1 tab by mouth every day at bedtime 4)  Taztia Xt 300 Mg Cp24 (Diltiazem hcl er beads) .... Take 1 tablet by mouth every morning 5)  Adderall Xr 15 Mg Xr24h-cap (Amphetamine-dextroamphetamine) .... Take 1 tablet by mouth once a day 6)  Multivitamins Tabs (Multiple vitamin) .... Take 1 tablet by mouth once a day 7)  Ginkgo Biloba Extr (Ginkgo biloba) .... Take 1 tablet by mouth two times a day 8)  Calcium Carbonate-vitamin D 600-400 Mg-unit Tabs (Calcium carbonate-vitamin d) .... Take 1 tablet by mouth once in a while 9)  Fish  Oil 1000 Mg Caps (Omega-3 fatty acids) .... Take 1 tablet by mouth every morning 10)  Aspirin 325 Mg Tabs (Aspirin) .... Take 1 tablet by mouth once a day 11)  Focus Factor 692mg   .... 1 tablet two times a day 12)  Vitamin C 500 Mg Tabs (Ascorbic acid) .... Take one tablet by mouth once daily. 13)  Vitamin E 400 Unit Caps (Vitamin e) .... Once daily 14)  Metoprolol Succinate 25 Mg Xr24h-tab (Metoprolol succinate) .... Take one tablet by mouth daily  Patient Instructions: 1)  I have provided you with a copy of your personalized plan for preventive services. Please take the time to review along with your updated medication list.  2)  Work on increasing regular exercise.  3)  Continue on crestor every day. 4)  Recheck fasting lipids, Ast ALt in 6 months. Dx 272.0  5)  Verify with your wife that you do have a living will in place.  6)  Return stool cards for colon cancer screening.  7)  We will defer change of adderal to Dr. Sandria Manly... or wait until adderal short activing available in 07/2009.   Orders Added: 1)  Medicare -1st Annual Wellness Visit [G0438] 2)  Est. Patient Level  III [16109]    Current Allergies (reviewed today): PENICILLIN G POTASSIUM (PENICILLIN G POTASSIUM)  Flu Vaccine Result Date:  04/04/2010 Flu Vaccine Next Due:  1 yr Last TD:  Tdap (11/20/2008 2:44:46 PM) TD Next Due:  10 yr Colonoscopy Next Due:  Refused Last PSA Result:  0.22 (03/12/2010 8:41:18 AM) PSA Next Due:  1 yr  Preventive Care Reminders:  FLU VAX, COLONOSCOPY, FLEX SIGMOID, HEMOCCULT.

## 2010-08-04 NOTE — Assessment & Plan Note (Signed)
Summary: rov  Medications Added ZETIA 10 MG TABS (EZETIMIBE) Take one tablet by mouth daily.      Allergies Added:   Visit Type:  Follow-up Referring Provider:  Randolm Idol Primary Provider:  Dr. Ermalene Searing  CC:  no complaints.  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 Take 1 Tablet By Mouth Once A Day 2)  Aricept 5 Mg  Tabs (Donepezil Hcl) .... Take 1 Tab By Mouth At Bedtime 3)  Zetia 10 Mg Tabs (Ezetimibe) .... Take One Tablet By Mouth Daily. 4)  Crestor 10 Mg  Tabs (Rosuvastatin Calcium) .... Take 1 Tab By Mouth Every Other Day At Bedtime 5)  Taztia Xt 300 Mg  Cp24 (Diltiazem Hcl Er Beads) .... Take 1 Tablet By Mouth Every Morning 6)  Adderall 10 Mg  Tabs (Amphetamine-Dextroamphetamine) .... Take 1 Tablet By Mouth Two Times A Day 7)  Multivitamins   Tabs (Multiple Vitamin) .... Take 1 Tablet By Mouth Once A Day 8)  Ginkgo Biloba   Extr (Ginkgo Biloba) .... Take 1 Tablet By Mouth Two Times A Day 9)  Calcium  Carbonate-Vitamin D 600-400 Mg-Unit  Tabs (Calcium Carbonate-Vitamin D) .... Take 1 Tablet By Mouth Once in A While 10)  Fish Oil 1000 Mg  Caps (Omega-3 Fatty Acids) .... Take 1 Tablet By Mouth Every Morning 11)  Glucosamine-Chondroitin   Caps (Glucosamine-Chondroit-Vit C-Mn) .... Take 1 Tablet By Mouth Once Daily 12)  Skelaxin 800 Mg  Tabs (Metaxalone) .Marland Kitchen.. 1 Tab By Mouth Three Times A Day As Needed Pain/muscle Spasm 13)  Singulair 10 Mg  Tabs (Montelukast Sodium) .... Take 1 Tablet By Mouth Once A Day As Needed 14)  Aspirin 325 Mg  Tabs (Aspirin) .... Take 1 Tablet By Mouth Once A Day  Allergies (verified): 1)  Penicillin G Potassium (Penicillin G Potassium)  Past History:  Past Medical History: Last updated: 01-04-09 SUDDEN DEATH (ICD-798.1) CARDIAC ARRHYTHMIA (ICD-427.9) HYPERLIPIDEMIA (ICD-272.4) ELEVATED BP READING WITHOUT DX HYPERTENSION (ICD-796.2) CONTACT DERMATITIS&OTHER ECZEMA DUE TO PLANTS (ICD-692.6) IDIOPATHIC URTICARIA (ICD-708.1) MELANOMA, CHEST (ICD-172.9) ADD (ICD-314.00) SLEEP APNEA (ICD-780.57) DEMENTIA, POST RESUSCITATIVE (ICD-294.8) HYPOTHYROIDISM (ICD-244.9) HEADACHE (ICD-784.0) DEPRESSION (ICD-311) LOW BACK PAIN SYNDROME (ICD-724.2)  Past Surgical History: Last updated: 02/03/2008 1/98      Cath - normal 10/30/02 Defibrillative change [name]) 12/12/02   Adenosine Cardiolite WNL EF53% 04/16/02 Sleep study - mild apnea 7/03      CT Head 11/00    CT head, slight prom Lat. and 3rd ventricle 6/03      Carotid US  - WNL 2009    hemorrhoidectomy  Family History: Last updated: 02/03/2008 HTN  - mother CAD - father  Social History: Last updated: 02/03/2008 married 2 daughters 4 grandchildren and one on the way  Risk Factors: Exercise: yes (11/20/2008)  Risk Factors: Smoking Status: never (11/20/2008)  Vital Signs:  Patient profile:   60 year old male Height:      70 inches Weight:      178.50 pounds BMI:     25.70 Pulse rate:   105 /  minute Pulse rhythm:   regular BP sitting:   128 / 96  (left arm)  Vitals Entered By: Mercer Pod (July 17, 2009 12:10 PM)     ICD Specifications Following MD:  Sherryl Manges, MD     ICD Vendor:  [facility] [employer]     ICD Model Number:  [phone]     ICD Serial Number:  [id number] ICD DOI:  10/30/2002     ICD Implanting MD:  Sherryl Manges, MD  Lead 1:    Location: RV     DOI: 08/08/1997     Model #: [id number]     Serial #: [id number]     Status: active  Indications::  VT   ICD Follow Up Remote Check?  No Battery Voltage:  2.56 V     Charge Time:  13.8 seconds     Battery Est. Longevity:  MOL2/15% ICD Dependent:  No       ICD Device Measurements Right Ventricle:  Amplitude: 10.3 mV, Impedance: 736 ohms, Threshold: 1.0 V at 0.8 msec Shock Impedance: 51 ohms   Episodes Coumadin:  No Shock:  0     ATP:  0     Nonsustained:  0     Ventricular Pacing:  1%  Brady Parameters Mode VVI     Lower Rate Limit:  50      Tachy Zones VF:  210     VT:  185     Next Cardiology Appt Due:  10/03/2009 Tech Comments:  No parameter changes.  Device functiion normal.   Battery MOL2.  Heart rate today 105-112bpm.  We will do an EKG to assess his rhythm.  ROV 3 months in the [facility]. Altha Harm, LPN  July 17, 2009 12:14 PM   Impression & Recommendations:  Problem # 1:  HYPERLIPIDEMIA (ICD-272.4) stop setia  prob stop crestor His updated medication list for this problem includes:    Zetia 10 Mg Tabs (Ezetimibe) .Marland Kitchen... Take one tablet by mouth daily.    Crestor 10 Mg Tabs (Rosuvastatin calcium) .Marland Kitchen... Take 1 tab by mouth every other day at bedtime  Problem # 2:  ELEVATED BP READING WITHOUT DX HYPERTENSION (ICD-796.2) keep watch  Problem # 3:  ADD (ICD-314.00) adderall may be contribuitnig to tachycardia will need to review old data  Problem # 4:  SUDDEN DEATH (ICD-798.1) would like ot stop cardiazem used for pvc suppression  but willdo when gets back from [employer]

## 2010-08-04 NOTE — Progress Notes (Signed)
Summary: adderall  Phone Note Refill Request Message from:  Patient on December 17, 2009 11:53 AM  Refills Requested: Medication #1:  ADDERALL 10 MG  TABS Take 1 tablet by mouth two times a day   Supply Requested: 1 month patient will need refill next week before he goes on vacation call patient when ready..   Method Requested: Pick up at Office Initial call taken by: Benny Lennert CMA Duncan Dull),  December 17, 2009 11:54 AM  Follow-up for Phone Call        Will print out but do not given to pt until next week.  Follow-up by: Kerby Nora MD,  December 17, 2009 12:04 PM  Additional Follow-up for Phone Call Additional follow up Details #1::        will call patient friday afternoon to let him know rx ready for pick up on  monday Additional Follow-up by: Benny Lennert CMA Duncan Dull),  December 18, 2009 11:07 AM    Additional Follow-up for Phone Call Additional follow up Details #2::    patient advised rx will be ready for pick up on Monday 12-22-2009 Follow-up by: Benny Lennert CMA (AAMA),  December 19, 2009 8:01 AM  Prescriptions: ADDERALL 10 MG  TABS (AMPHETAMINE-DEXTROAMPHETAMINE) Take 1 tablet by mouth two times a day  #60 x 0   Entered and Authorized by:   Kerby Nora MD   Signed by:   Kerby Nora MD on 12/17/2009   Method used:   Print then Give to Patient   RxID:   [id number]

## 2010-08-04 NOTE — Progress Notes (Signed)
Summary: needs refill on adderal  Phone Note Refill Request Call back at Home Phone [phone] Message from:  Patient  Refills Requested: Medication #1:  ADDERALL 10 MG  TABS Take 1 tablet by mouth two times a day Phoned request from pt, please call when ready on monday.  Initial call taken by: Lowella Petties,  October 03, 2008 2:24 PM  Follow-up for Phone Call        Patient Advised. Prescription left at front desk.  Follow-up by: Delilah Shan,  October 07, 2008 10:56 AM      Prescriptions: ADDERALL 10 MG  TABS (AMPHETAMINE-DEXTROAMPHETAMINE) Take 1 tablet by mouth two times a day  #60 x 0   Entered and Authorized by:   Kerby Nora MD   Signed by:   Kerby Nora MD on 10/03/2008   Method used:   Print then Give to Patient   RxID:   [id number]

## 2010-08-04 NOTE — Consult Note (Signed)
Summary: [facility]/Dr. Elsie Lincoln  [facility]/Dr. Elsie Lincoln   Imported By: Eleonore Chiquito 05/06/2008 16:12:39  _____________________________________________________________________  External Attachment:    Type:   Image     Comment:   External Document

## 2010-08-04 NOTE — Progress Notes (Signed)
Summary: refill request for adderall  Phone Note Refill Request Call back at Home Phone [phone] Message from:  Patient  Refills Requested: Medication #1:  ADDERALL 10 MG  TABS Take 1 tablet by mouth two times a day Please call pt when ready.  Initial call taken by: Lowella Petties CMA,  March 06, 2010 8:27 AM  Follow-up for Phone Call        Patient advised rx ready for pick up Follow-up by: Benny Lennert CMA Duncan Dull),  March 06, 2010 9:28 AM    Prescriptions: ADDERALL 10 MG  TABS (AMPHETAMINE-DEXTROAMPHETAMINE) Take 1 tablet by mouth two times a day  #60 x 0   Entered and Authorized by:   Kerby Nora MD   Signed by:   Kerby Nora MD on 03/06/2010   Method used:   Print then Give to Patient   RxID:   [id number]

## 2010-08-04 NOTE — Assessment & Plan Note (Signed)
Summary: RASH AROUND NECK   Vital Signs:  Patient Profile:   60 Years Old Male Weight:      175 pounds Temp:     97.3 degrees F oral Pulse rate:   103 / minute Pulse rhythm:   regular Resp:     18 per minute BP sitting:   132 / 96  (right arm)  Vitals Entered By: Cheri Guppy (February 03, 2008 10:14 AM)                 Visit Type:  acute PCP:  Dr. Ermalene Searing  Chief Complaint:  rash around neck.  History of Present Illness: Patient had poison oak rash  last Saturday.  It was slowly resolving on its own - with just a few linger lesions on his hands and upper extremities. He had a hemorrhoidectomy on Monday.  Then on Wednesday, he developed a rash on the neck / trunk and upper extremities.  Patient states that this type of rash appears any time that he gets sick or has significant stress.  It has been this way for many years.  However, he also started feeling very tired on Thursday and that was a little different than the usual rash flare.  Also, there was a little weeping on the residual rash from the poison oak.  Only new medication is a stool softener.  No new topical exposures - except recent poison oak.   Nothing really makes the rash worse or better.  No fever / chills / nausea / vomiting / diarrhea / SOB / tongue swelling / facial swellng or other associated sx.  He is actually much less tired today and the rash on the neck / trunk and upper extremities has nearly resolved.  He feels like he is wasting time coming in today, but his wife wanted to be sure everything was all right.  The patient has a significant h/o "sudden death" event in the past and she is a little protective.      Updated Prior Medication List: FEXOFENADINE HCL 180 MG TABS (FEXOFENADINE HCL) Take 1 tablet by mouth once a day ZETIA 10 MG  TABS (EZETIMIBE) Take 1 tab by mouth at bedtime LEXAPRO 10 MG  TABS (ESCITALOPRAM OXALATE) Take 1 tab by mouth at bedtime ARICEPT 5 MG  TABS (DONEPEZIL HCL) Take 1 tab by  mouth at bedtime NIASPAN 500 MG  TBCR (NIACIN (ANTIHYPERLIPIDEMIC)) Take 1 tab by mouth at bedtime CRESTOR 10 MG  TABS (ROSUVASTATIN CALCIUM) Take 1 tab by mouth at bedtime TAZTIA XT 300 MG  CP24 (DILTIAZEM HCL ER BEADS) Take 1 tablet by mouth every morning ADDERALL 10 MG  TABS (AMPHETAMINE-DEXTROAMPHETAMINE) Take 1 tablet by mouth two times a day MULTIVITAMINS   TABS (MULTIPLE VITAMIN) Take 1 tablet by mouth once a day ACETAMINOPHEN 325 MG  TABS (ACETAMINOPHEN) Take 1 tablet by mouth once a day GINKGO BILOBA   EXTR (GINKGO BILOBA) Take 1 tablet by mouth two times a day CALCIUM CARBONATE-VITAMIN D 600-400 MG-UNIT  TABS (CALCIUM CARBONATE-VITAMIN D) Take 1 tablet by mouth every morning FISH OIL 1000 MG  CAPS (OMEGA-3 FATTY ACIDS) Take 1 tablet by mouth every morning FLAX   OIL (FLAXSEED (LINSEED)) Take 1 tablet by mouth every morning GLUCOSAMINE-CHONDROITIN   CAPS (GLUCOSAMINE-CHONDROIT-VIT C-MN) Take 1 tablet by mouth two times a day SKELAXIN 800 MG  TABS (METAXALONE) 1 tab by mouth three times a day as needed pain/muscle spasm SINGULAIR 10 MG  TABS (MONTELUKAST SODIUM) Take 1 tablet  by mouth once a day as needed  Current Allergies: PENICILLIN G POTASSIUM (PENICILLIN G POTASSIUM)  Past Medical History:    Reviewed history from 10/28/2007 and no changes required:       MELANOMA, CHEST (ICD-172.9)       ADD (ICD-314.00)       SLEEP APNEA (ICD-780.57)       DEMENTIA, POST RESUSCITATIVE (ICD-294.8)       SUDDEN DEATH (ICD-798.1)       CARDIAC ARRHYTHMIA (ICD-427.9)       HYPOTHYROIDISM (ICD-244.9)       HYPERLIPIDEMIA (ICD-272.4)       HEADACHE (ICD-784.0)       DEPRESSION (ICD-311)       LOW BACK PAIN SYNDROME (ICD-724.2)          Past Surgical History:    Reviewed history from 10/29/2007 and no changes required:       1/98      Cath - normal       10/30/02 Defibrillative change [name])       12/12/02   Adenosine Cardiolite WNL EF53%       04/16/02 Sleep study - mild apnea       7/03       CT Head       11/00    CT head, slight prom Lat. and 3rd ventricle       6/03      Carotid US  - WNL       2009    hemorrhoidectomy   Family History:    Reviewed history and no changes required:       HTN  - mother       CAD - father  Social History:    Reviewed history and no changes required:       married       2 daughters       4 grandchildren and one on the way   Risk Factors:  Tobacco use:  quit    Year quit:  1997    Pack-years:  1 ppd x 15 years   Review of Systems       General      Denies chills, fatigue, fever.  Eyes      Denies changes in vision, discharge, eye irritation, eye pain  ENT      Denies earache, hoarseness or sore throat, sinus congestion or pressue, postnasal drainage  CV      Denies chest pain, difficulty breathing at night, swelling of feet.  Patient with a long h/o irregular rhythm - but no acute changes.  Resp      Denies chest pain with inspiration, cough, shortness of breath, and wheezing.  GI      Denies abdominal pain, constipation, diarrhea, nausea.  Recent hemorrhoidectomy - see HPI  GU      Denies discharge, dysuria, hematuria, urinary frequency or urgency.  MS      Denies joint redness / swelling, back pain,  muscle weakness.  Derm      see HPI   Physical Exam  Skin:     There a few scattered minimally weeping lesions on the hands - obviously healing and nearly resolved - patient states these are the resolving poison oak.  There is resolving hive type rash on the left arm with no vesicles, no discharge/weeping and patient says this is all that is left of the other rash. Additional Exam:     General:     Well-developed,well-nourished,  well-hydrated, in no acute distress Eyes:     No conjunctival inflammation or injection noted. Vision grossly normal. Ears:     External ear exam shows no significant lesions or deformities.   Nose:     External nasal examination shows no deformity or inflammation.  Neck:      supple, full ROM, no nuchal rigidity.   Lungs:     Normal respiratory effort, chest expands symmetrically. Lungs are clear to auscultation, no crackles, rales  or wheezes. normal breath sounds and good air flow.   Heart:     Normal rate and regular rhythm. S1 and S2 normal without gallop, murmur, click, rub or other extra sounds. Msk:     No gross deformity noted of cervical,  thoracic or lumbar spine.  normal ROM and no muscle atrophy noted.   Extremities:     No clubbing, cyanosis, edema, or deformity noted with grossly normal full range of motion of all joints.   Neurologic:     alert & oriented X3 and gait normal.  no deficit in strength noted, no balance problems noted, neuro is grossly intact Psych:     Cognition and judgment appear intact. Alert and cooperative, normally interactive      Impression & Recommendations:  Problem # 1:  IDIOPATHIC URTICARIA (ICD-708.1) reviewed urticaria dx and expected course.  call for any concerns  Problem # 2:  CONTACT DERMATITIS&OTHER ECZEMA DUE TO PLANTS (ICD-692.6) will give some cream for the resolving poison oak.  call for any concerns His updated medication list for this problem includes:    Fexofenadine Hcl 180 Mg Tabs (Fexofenadine hcl) .Marland Kitchen... Take 1 tablet by mouth once a day    Hydrocortisone Valerate 0.2 % Crea (Hydrocortisone valerate) .Marland Kitchen... Apply sparingly to affected area twice daily as needed.  do not use for more than 2 weeks at a time   Complete Medication List: 1)  Fexofenadine Hcl 180 Mg Tabs (Fexofenadine hcl) .... Take 1 tablet by mouth once a day 2)  Zetia 10 Mg Tabs (Ezetimibe) .... Take 1 tab by mouth at bedtime 3)  Lexapro 10 Mg Tabs (Escitalopram oxalate) .... Take 1 tab by mouth at bedtime 4)  Aricept 5 Mg Tabs (Donepezil hcl) .... Take 1 tab by mouth at bedtime 5)  Niaspan 500 Mg Tbcr (Niacin (antihyperlipidemic)) .... Take 1 tab by mouth at bedtime 6)  Crestor 10 Mg Tabs (Rosuvastatin calcium) .... Take 1 tab by  mouth at bedtime 7)  Taztia Xt 300 Mg Cp24 (Diltiazem hcl er beads) .... Take 1 tablet by mouth every morning 8)  Adderall 10 Mg Tabs (Amphetamine-dextroamphetamine) .... Take 1 tablet by mouth two times a day 9)  Multivitamins Tabs (Multiple vitamin) .... Take 1 tablet by mouth once a day 10)  Acetaminophen 325 Mg Tabs (Acetaminophen) .... Take 1 tablet by mouth once a day 11)  Ginkgo Biloba Extr (Ginkgo biloba) .... Take 1 tablet by mouth two times a day 12)  Calcium Carbonate-vitamin D 600-400 Mg-unit Tabs (Calcium carbonate-vitamin d) .... Take 1 tablet by mouth every morning 13)  Fish Oil 1000 Mg Caps (Omega-3 fatty acids) .... Take 1 tablet by mouth every morning 14)  Flax Oil (Flaxseed (linseed)) .... Take 1 tablet by mouth every morning 15)  Glucosamine-chondroitin Caps (Glucosamine-chondroit-vit c-mn) .... Take 1 tablet by mouth two times a day 16)  Skelaxin 800 Mg Tabs (Metaxalone) .Marland Kitchen.. 1 tab by mouth three times a day as needed pain/muscle spasm 17)  Singulair 10 Mg Tabs (  Montelukast sodium) .... Take 1 tablet by mouth once a day as needed 18)  Hydrocortisone Valerate 0.2 % Crea (Hydrocortisone valerate) .... Apply sparingly to affected area twice daily as needed.  do not use for more than 2 weeks at a time    Prescriptions: HYDROCORTISONE VALERATE 0.2 %  CREA (HYDROCORTISONE VALERATE) apply sparingly to affected area twice daily as needed.  do not use for more than 2 weeks at a time  #15 grms x 0   Entered and Authorized by:   Paulo Fruit MD   Signed by:   Paulo Fruit MD on 02/03/2008   Method used:   Electronically sent to ...       [facility]  [address] [phone]*       [address]       [place], [place]  [address]       Ph: [phone]       Fax: [phone]   RxID:   [id number]  ]

## 2010-08-04 NOTE — Miscellaneous (Signed)
Summary: DEVICE PRELOAD  Clinical Lists Changes  Observations: Added new observation of ICD INDICATN: VT (08/28/2008 14:20) Added new observation of ICDLEADSTAT1: active (08/28/2008 14:20) Added new observation of ICDLEADSER1: 161096  (08/28/2008 14:20) Added new observation of ICDLEADMOD1: 0135  (08/28/2008 14:20) Added new observation of ICDLEADDOI1: 08/08/1997  (08/28/2008 14:20) Added new observation of ICDLEADLOC1: RV  (08/28/2008 14:20) Added new observation of ICD IMP MD: Sherryl Manges, MD  (08/28/2008 14:20) Added new observation of ICD IMPL DTE: 10/30/2002  (08/28/2008 14:20) Added new observation of ICD SERL#: 045409  (08/28/2008 14:20) Added new observation of ICD MODL#: 1860  (08/28/2008 14:20) Added new observation of ICDMANUFACTR: [employer]  (08/28/2008 14:20) Added new observation of CARDIO MD: Sherryl Manges, MD  (08/28/2008 14:20)      ICD Specifications Following MD:  Sherryl Manges, MD     ICD Vendor:  [facility] [employer]     ICD Model Number:  [phone]     ICD Serial Number:  [id number] ICD DOI:  10/30/2002     ICD Implanting MD:  Sherryl Manges, MD  Lead 1:    Location: RV     DOI: 08/08/1997     Model #: [id number]     Serial #: [id number]     Status: active  Indications::  VT

## 2010-08-04 NOTE — Progress Notes (Signed)
Summary: refill request for adderall  Phone Note Refill Request Call back at Home Phone [phone] Message from:  Patient  Refills Requested: Medication #1:  ADDERALL 10 MG  TABS Take 1 tablet by mouth two times a day Please call pt when ready.  Initial call taken by: Lowella Petties CMA,  April 13, 2010 3:13 PM  Follow-up for Phone Call        Patient advised rx ready for pick up.Consuello Masse CMA      Prescriptions: ADDERALL 10 MG  TABS (AMPHETAMINE-DEXTROAMPHETAMINE) Take 1 tablet by mouth two times a day  #60 x 0   Entered and Authorized by:   Kerby Nora MD   Signed by:   Kerby Nora MD on 04/14/2010   Method used:   Print then Give to Patient   RxID:   [id number]

## 2010-08-04 NOTE — Progress Notes (Signed)
Summary: refill request for ambien  Phone Note Refill Request Message from:  Fax from Pharmacy  Refills Requested: Medication #1:  zolpidem tartrate 5 mg   Last Refilled: 11/11/2009 [facility] [address].  This is not on med list.  Initial call taken by: Lowella Petties CMA,  December 10, 2009 2:44 PM  Follow-up for Phone Call        Denied..we have never had him on this per EMR list..Due for CPX past 5/19.Marland Kitchenschedule with labs prior Dx 272.0 CMET, lipids, PSA Dx v76.44 Will discuss ambien then.  Follow-up by: Kerby Nora MD,  December 10, 2009 4:49 PM  Additional Follow-up for Phone Call Additional follow up Details #1::        Patient says they did not request this refill.  It was prescribed by Dr. Elsie Lincoln a long time ago and he no longer needs it.  They say it must be an "auto refill" kind of thing.    Pharmacy was advised about the denial.  Patient's wife is scheduling a lab appt. and CPX with scheduler.  Dr. Excell Seltzer has a lab order to be drawn here before 12/22/09.  These labs will be added to that order.  Lugene Fuquay CMA Duncan Dull)  December 11, 2009 9:51 AM

## 2010-08-04 NOTE — Progress Notes (Signed)
Summary: needs refill on adderall  Phone Note Refill Request Call back at Home Phone [phone] Message from:  Patient  Refills Requested: Medication #1:  ADDERALL 10 MG  TABS Take 1 tablet by mouth two times a day Please call pt when ready.  Initial call taken by: Lowella Petties CMA,  October 23, 2009 9:51 AM  Follow-up for Phone Call        Thursday of Easter weekend. Will go ahead and fill. Follow-up by: Hannah Beat MD,  October 23, 2009 9:54 AM  Additional Follow-up for Phone Call Additional follow up Details #1::        Patient advised.Consuello Masse CMA  Additional Follow-up by: Benny Lennert CMA Duncan Dull),  October 23, 2009 9:58 AM    Prescriptions: ADDERALL 10 MG  TABS (AMPHETAMINE-DEXTROAMPHETAMINE) Take 1 tablet by mouth two times a day  #60 x 0   Entered and Authorized by:   Hannah Beat MD   Signed by:   Hannah Beat MD on 10/23/2009   Method used:   Print then Give to Patient   RxID:   [id number]

## 2010-08-04 NOTE — Progress Notes (Signed)
Summary: addrell  Phone Note Refill Request Message from:  Patient on May 21, 2010 3:42 PM  Refills Requested: Medication #1:  ADDERALL XR 15 MG XR24H-CAP Take 1 tablet by mouth once a day   Supply Requested: 1 month call patietn when ready   Method Requested: Pick up at Office Initial call taken by: Benny Lennert CMA Duncan Dull),  May 21, 2010 3:42 PM  Follow-up for Phone Call        Dr. Ermalene Searing gone for week  Follow-up by: Hannah Beat MD,  May 22, 2010 12:48 PM  Additional Follow-up for Phone Call Additional follow up Details #1::        Script placed up front for pick up. Additional Follow-up by: Lowella Petties CMA, AAMA,  May 22, 2010 3:17 PM    Prescriptions: ADDERALL XR 15 MG XR24H-CAP (AMPHETAMINE-DEXTROAMPHETAMINE) Take 1 tablet by mouth once a day  #30 x 0   Entered and Authorized by:   Hannah Beat MD   Signed by:   Hannah Beat MD on 05/22/2010   Method used:   Print then Give to Patient   RxID:   [id number]

## 2010-08-04 NOTE — Progress Notes (Signed)
Summary: Prior Authorization Adderall/Form to complete  Phone Note From Pharmacy Call back at [phone]   Caller: [facility] Call For: Dr. Ermalene Searing  Summary of Call: Received form back for prior authorization.  Please fill out question number 6 and fax back to [phone].  Form is in your IN box. Initial call taken by: Linde Gillis CMA Duncan Dull),  August 21, 2009 8:44 AM     Appended Document: Prior Authorization Adderall/Form to complete Faxed form to [phone].    Appended Document: Prior Authorization Adderall/Form to complete Received prior authorization approval for Adderall.  Approved from 08/25/2009-08/25/2010.  Patient and pharmacy notified.

## 2010-08-04 NOTE — Letter (Signed)
Summary: [facility] / F/U CARDIAC ARREST / DR.  [facility] / F/U CARDIAC ARREST / DR. Chrissie Noa [facility]   Imported By: Carin Primrose 11/06/2008 11:20:14  _____________________________________________________________________  External Attachment:    Type:   Image     Comment:   External Document  Appended Document: [facility] / F/U CARDIAC ARREST / DR. HAve pt come in for TSH and free T3 and T4 prior to upcoming visit to verify abnormality see on labs at Dr. Reino Kent office.  Dx 244.9  Appended Document: [facility] / F/U CARDIAC ARREST / DR. Patient Advised. Lab appointment scheduled:  11/18/2008 prior to appt. on 11/20/2008  Appended Document: Orders Update  Notify pt that direct thyroid test are normal, although TSH is slightly high...mno treatment needed , will continue to follow and recheck in 6 months Dx 244.9 TSH, free T3 and free T4 Kerby Nora MD  Nov 18, 2008 11:17 PM   Clinical Lists Changes  Problems: Changed problem from HYPOTHYROIDISM (ICD-244.9) to HYPOTHYROIDISM (ICD-244.9) - subclinical - Signed

## 2010-08-04 NOTE — Progress Notes (Signed)
Summary: Adderall Rx  Phone Note Call from Patient Call back at [phone]   Caller: Patient Call For: Dr. Patsy Lager Summary of Call: Pt calls requesting written Rx for Adderal 10mg  Pt takes one tablet two times aday. Pt would like call back when ready [phone]. Pt would like to pick up Rx on 05/20/09 Tues Morning. Please advise.  Initial call taken by: Lewanda Rife LPN,  May 19, 2009 2:11 PM  Follow-up for Phone Call        patient advised.Consuello Masse CMA  Follow-up by: Benny Lennert CMA (AAMA),  May 20, 2009 2:18 PM    Prescriptions: ADDERALL 10 MG  TABS (AMPHETAMINE-DEXTROAMPHETAMINE) Take 1 tablet by mouth two times a day  #60 x 0   Entered by:   Kerby Nora MD   Authorized by:   Hannah Beat MD   Signed by:   Kerby Nora MD on 05/20/2009   Method used:   Print then Give to Patient   RxID:   [id number]

## 2010-08-04 NOTE — Progress Notes (Signed)
Summary: adderall   Phone Note Refill Request Call back at Home Phone [phone]   Refills Requested: Medication #1:  ADDERALL 10 MG  TABS Take 1 tablet by mouth two times a day Please Call Patient when ready.    Method Requested: Pick up at Office Initial call taken by: Melody Comas,  September 29, 2009 10:05 AM  Follow-up for Phone Call        will let Dr. Ermalene Searing fill controlled substance Follow-up by: Hannah Beat MD,  September 29, 2009 10:07 AM  Additional Follow-up for Phone Call Additional follow up Details #1::        patient notified rx ready Additional Follow-up by: Benny Lennert CMA Duncan Dull),  September 30, 2009 9:13 AM    Prescriptions: ADDERALL 10 MG  TABS (AMPHETAMINE-DEXTROAMPHETAMINE) Take 1 tablet by mouth two times a day  #60 x 0   Entered by:   Benny Lennert CMA (AAMA)   Authorized by:   Kerby Nora MD   Signed by:   Benny Lennert CMA (AAMA) on 09/30/2009   Method used:   Print then Give to Patient   RxID:   [id number] ADDERALL 10 MG  TABS (AMPHETAMINE-DEXTROAMPHETAMINE) Take 1 tablet by mouth two times a day  #60 x 0   Entered by:   Kerby Nora MD   Authorized by:   Hannah Beat MD   Signed by:   Kerby Nora MD on 09/30/2009   Method used:   Print then Give to Patient   RxID:   [id number]

## 2010-08-04 NOTE — Progress Notes (Signed)
Summary: adderall  Phone Note Refill Request Call back at Home Phone [phone] Message from:  Patient on Nov 28, 2009 10:14 AM  Refills Requested: Medication #1:  ADDERALL 10 MG  TABS Take 1 tablet by mouth two times a day Please call patient when ready for pick up.    Method Requested: Pick up at Office Initial call taken by: Melody Comas,  Nov 28, 2009 10:14 AM Caller: Patient  Follow-up for Phone Call        Patient advised rx ready for pick up Follow-up by: Benny Lennert CMA Duncan Dull),  Nov 28, 2009 2:25 PM    Prescriptions: ADDERALL 10 MG  TABS (AMPHETAMINE-DEXTROAMPHETAMINE) Take 1 tablet by mouth two times a day  #60 x 0   Entered and Authorized by:   Kerby Nora MD   Signed by:   Kerby Nora MD on 11/28/2009   Method used:   Print then Give to Patient   RxID:   [id number]

## 2010-08-04 NOTE — Assessment & Plan Note (Signed)
Summary: F3M/AMD  Medications Added FEXOFENADINE HCL 180 MG TABS (FEXOFENADINE HCL) Take 1 tablet by mouth once a day CRESTOR 10 MG  TABS (ROSUVASTATIN CALCIUM) Take 1 tab by mouth every other day at bedtime GLUCOSAMINE-CHONDROITIN   CAPS (GLUCOSAMINE-CHONDROIT-VIT C-MN) Take 1 tablet by mouth once daily SKELAXIN 800 MG  TABS (METAXALONE) 1 tab by mouth three times a day as needed pain/muscle spasm      Allergies Added:   Referring Provider:  Randolm Idol Primary Provider:  Dr. Ermalene Searing   History of Present Illness: No complaints of chest pain or SOB;  memory remains in pretty good shape  Current Medications (verified): 1)  Fexofenadine Hcl 180 Mg Tabs (Fexofenadine Hcl) .... Take 1 Tablet By Mouth Once A Day 2)  Aricept 5 Mg  Tabs (Donepezil Hcl) .... Take 1 Tab By Mouth At Bedtime 3)  Niaspan 500 Mg  Tbcr (Niacin (Antihyperlipidemic)) .... Take 1 Tab By Mouth At Bedtime 4)  Crestor 10 Mg  Tabs (Rosuvastatin Calcium) .... Take 1 Tab By Mouth Every Other Day At Bedtime 5)  Taztia Xt 300 Mg  Cp24 (Diltiazem Hcl Er Beads) .... Take 1 Tablet By Mouth Every Morning 6)  Adderall 10 Mg  Tabs (Amphetamine-Dextroamphetamine) .... Take 1 Tablet By Mouth Two Times A Day 7)  Multivitamins   Tabs (Multiple Vitamin) .... Take 1 Tablet By Mouth Once A Day 8)  Ginkgo Biloba   Extr (Ginkgo Biloba) .... Take 1 Tablet By Mouth Two Times A Day 9)  Calcium Carbonate-Vitamin D 600-400 Mg-Unit  Tabs (Calcium Carbonate-Vitamin D) .... Take 1 Tablet By Mouth Once in A While 10)  Fish Oil 1000 Mg  Caps (Omega-3 Fatty Acids) .... Take 1 Tablet By Mouth Every Morning 11)  Glucosamine-Chondroitin   Caps (Glucosamine-Chondroit-Vit C-Mn) .... Take 1 Tablet By Mouth Once Daily 12)  Skelaxin 800 Mg  Tabs (Metaxalone) .Marland Kitchen.. 1 Tab By Mouth Three Times A Day As Needed Pain/muscle Spasm 13)  Singulair 10 Mg  Tabs (Montelukast Sodium) .... Take 1 Tablet By Mouth Once A Day As Needed 14)  Hydrocortisone Valerate 0.2 %  Crea  (Hydrocortisone Valerate) .... Apply Sparingly To Affected Area Twice Daily As Needed.  Do Not Use For More Than 2 Weeks At A Time 15)  Aspirin 325 Mg  Tabs (Aspirin) .... Take 1 Tablet By Mouth Once A Day  Allergies (verified): 1)  Penicillin G Potassium (Penicillin G Potassium)  Past History:  Past Medical History: Last updated: 12-31-08 SUDDEN DEATH (ICD-798.1) CARDIAC ARRHYTHMIA (ICD-427.9) HYPERLIPIDEMIA (ICD-272.4) ELEVATED BP READING WITHOUT DX HYPERTENSION (ICD-796.2) CONTACT DERMATITIS&OTHER ECZEMA DUE TO PLANTS (ICD-692.6) IDIOPATHIC URTICARIA (ICD-708.1) MELANOMA, CHEST (ICD-172.9) ADD (ICD-314.00) SLEEP APNEA (ICD-780.57) DEMENTIA, POST RESUSCITATIVE (ICD-294.8) HYPOTHYROIDISM (ICD-244.9) HEADACHE (ICD-784.0) DEPRESSION (ICD-311) LOW BACK PAIN SYNDROME (ICD-724.2)  Vital Signs:  Patient profile:   60 year old male Height:      70 inches Weight:      175.50 pounds BMI:     25.27 Pulse rate:   87 / minute Pulse rhythm:   regular BP sitting:   122 / 82  (left arm) Cuff size:   regular  Vitals Entered By: Mercer Pod (February 03, 2009 10:27 AM)  Physical Exam  General:  The patient was alert and oriented in no acute distress.Neck veins were flat, carotids were brisk. Lungs were clear. Heart sounds were regular without murmurs or gallops. Abdomen was soft with active bowel sounds. There is no clubbing cyanosis or edema.    ICD Specifications Following  MD:  Sherryl Manges, MD     ICD Vendor:  [facility] [employer]     ICD Model Number:  [phone]     ICD Serial Number:  [id number] ICD DOI:  10/30/2002     ICD Implanting MD:  Sherryl Manges, MD  Lead 1:    Location: RV     DOI: 08/08/1997     Model #: [id number]     Serial #: [id number]     Status: active  Indications::  VT   ICD Specs Battery Voltage:  2.57 V     Charge Time:  13.8 seconds     Battery Est. Longevity:  10% Underlying rhythm:  SR@90  ICD Dependent:  No       ICD Device Measurements Atrium:  Right  Ventricle:  Amplitude: 9.5 mV, Impedance: 736 ohms, Threshold: 2.0 V at 0.8 msec Left Ventricle:  Shock Impedance: 51 ohms   Episodes Coumadin:  No Shock:  0     ATP:  0     Nonsustained:  1      Brady Parameters  Tachy Zones Tech Comments:  No changes. Device function normal Altha Harm, LPN  February 03, 2009 10:25 AM   Impression & Recommendations:  Problem # 1:  SUDDEN DEATH (ICD-798.1) Aborted; no intercurrent arrhythmia  Problem # 2:  AUTOMATIC IMPLANTABLE CARDIAC DEFIBRILLATOR SITU (ICD-V45.02) Stable GDT VVI ICD function

## 2010-08-04 NOTE — Medication Information (Signed)
Summary: Approval for Adderall/[facility]  Approval for Adderall/[facility]   Imported By: Lanelle Bal 08/27/2009 13:13:52  _____________________________________________________________________  External Attachment:    Type:   Image     Comment:   External Document

## 2010-08-04 NOTE — Progress Notes (Signed)
Summary: needs refill on adderal- needs this morning  Phone Note Refill Request Call back at Home Phone [phone] Call back at [phone] Message from:  Patient  Refills Requested: Medication #1:  ADDERALL 10 MG  TABS Take 1 tablet by mouth two times a day Pt needs to pick this up this morning, he is out.  Please call when ready.  Initial call taken by: Lowella Petties CMA,  March 11, 2009 8:44 AM  Follow-up for Phone Call        patient advised rx ready Follow-up by: Benny Lennert CMA (AAMA),  March 11, 2009 9:07 AM    Prescriptions: ADDERALL 10 MG  TABS (AMPHETAMINE-DEXTROAMPHETAMINE) Take 1 tablet by mouth two times a day  #60 x 0   Entered and Authorized by:   Kerby Nora MD   Signed by:   Kerby Nora MD on 03/11/2009   Method used:   Print then Give to Patient   RxID:   [id number]

## 2010-08-04 NOTE — Consult Note (Signed)
Summary: [facility]  [facility]   Imported By: Lanelle Bal 11/11/2009 11:04:12  _____________________________________________________________________  External Attachment:    Type:   Image     Comment:   External Document

## 2010-08-04 NOTE — Progress Notes (Signed)
Summary: Prior Authorization for Adderall  Phone Note From Pharmacy Call back at [phone]   [facility]er: [facility] Call For: Dr. Ermalene Searing  Summary of Call: Received fax from pharmacy stating that prior authorization is needed for Adderall.  Called [phone], sending forms today.  Linde Gillis CMA Duncan Dull)  August 14, 2009 11:48 AM   Received faxed forms, forms in your IN box.   Initial call taken by: Linde Gillis CMA Duncan Dull),  August 14, 2009 4:18 PM     Appended Document: Prior Authorization for Adderall Forms faxed to [facility], [phone].  Appended Document: Prior Authorization for Adderall Please fill in Diagnosis code and ICD-9 Code for authorization of Adderall.  Form in your IN box.  Appended Document: Prior Authorization for Adderall Patient called to check status of prior authorization, advised his spouse that form has been filled out and faxed back to [employer].  We are waiting to hear back from them.

## 2010-08-04 NOTE — Letter (Signed)
Summary: Implantable Device Instructions  Architectural technologist at [facility] [address]   [place], [place] [address]   Phone: [phone]  Fax: [phone]      Implantable Device Instructions  You are scheduled for:  _____ Permanent Transvenous Pacemaker _____ Implantable Cardioverter Defibrillator _____ Implantable Loop Recorder ___x__ Generator Change  on 07/20/10 with Dr. [name].  1.  Please arrive at the Short Stay Center at [facility] at 5:45am on the day of your procedure.  2.  Do not eat or drink the night before your procedure.  3.  Complete lab work on 07/13/09.    4.  You may take all AM medications with a sip of water.  5.  Plan for an overnight stay.  Bring your insurance cards and a list of your medications.  6.  Wash your chest and neck with antibacterial soap (any brand) the evening before and the morning of your procedure.  Rinse well.   *If you have ANY questions after you get home, please call the office [phone].  *Every attempt is made to prevent procedures from being rescheduled.  Due to the nauture of Electrophysiology, rescheduling can happen.  The physician is always aware and directs the staff when this occurs.

## 2010-08-04 NOTE — Progress Notes (Signed)
Summary:  ADDERALL  Phone Note Refill Request Message from:  Patient on July 08, 2009 9:04 AM  Refills Requested: Medication #1:  ADDERALL 10 MG  TABS Take 1 tablet by mouth two times a day Call pt when ready   Method Requested: Pick up at Office Initial call taken by: Mervin Hack CMA Duncan Dull),  July 08, 2009 9:04 AM  Follow-up for Phone Call        patient advised.Consuello Masse CMA  Follow-up by: Benny Lennert CMA Duncan Dull),  July 08, 2009 2:21 PM    Prescriptions: ADDERALL 10 MG  TABS (AMPHETAMINE-DEXTROAMPHETAMINE) Take 1 tablet by mouth two times a day  #60 x 0   Entered and Authorized by:   Kerby Nora MD   Signed by:   Kerby Nora MD on 07/08/2009   Method used:   Print then Give to Patient   RxID:   [id number]

## 2010-08-04 NOTE — Procedures (Signed)
Summary: device/saf  Medications Added SKELAXIN 800 MG  TABS (METAXALONE) 1 tab by mouth three times a day as needed pain/muscle spasm      Allergies Added:   Current Medications (verified): 1)  Fexofenadine Hcl 180 Mg Tabs (Fexofenadine Hcl) .... Take 1 Tablet By Mouth Once A Day 2)  Aricept 5 Mg  Tabs (Donepezil Hcl) .... Take 1 Tab By Mouth At Bedtime 3)  Crestor 10 Mg  Tabs (Rosuvastatin Calcium) .... Take 1 Tab By Mouth Every Day At Bedtime 4)  Taztia Xt 300 Mg  Cp24 (Diltiazem Hcl Er Beads) .... Take 1 Tablet By Mouth Every Morning 5)  Adderall 10 Mg  Tabs (Amphetamine-Dextroamphetamine) .... Take 1 Tablet By Mouth Two Times A Day 6)  Multivitamins   Tabs (Multiple Vitamin) .... Take 1 Tablet By Mouth Once A Day 7)  Ginkgo Biloba   Extr (Ginkgo Biloba) .... Take 1 Tablet By Mouth Two Times A Day 8)  Calcium Carbonate-Vitamin D 600-400 Mg-Unit  Tabs (Calcium Carbonate-Vitamin D) .... Take 1 Tablet By Mouth Once in A While 9)  Fish Oil 1000 Mg  Caps (Omega-3 Fatty Acids) .... Take 1 Tablet By Mouth Every Morning 10)  Skelaxin 800 Mg  Tabs (Metaxalone) .Marland Kitchen.. 1 Tab By Mouth Three Times A Day As Needed Pain/muscle Spasm 11)  Aspirin 325 Mg  Tabs (Aspirin) .... Take 1 Tablet By Mouth Once A Day 12)  Focus Factor 692mg  .... 1 Tablet Two Times A Day 13)  Vitamin C 500 Mg  Tabs (Ascorbic Acid) .... Take One Tablet By Mouth Once Daily. 14)  Vitamin E 400 Unit Caps (Vitamin E) .... Once Daily 15)  Metoprolol Succinate 25 Mg Xr24h-Tab (Metoprolol Succinate) .... Take One Tablet By Mouth Daily  Allergies (verified): 1)  Penicillin G Potassium (Penicillin G Potassium)   ICD Specifications Following MD:  Sherryl Manges, MD     ICD Vendor:  [facility] [employer]     ICD Model Number:  [phone]     ICD Serial Number:  [id number] ICD DOI:  10/30/2002     ICD Implanting MD:  Sherryl Manges, MD  Lead 1:    Location: RV     DOI: 08/08/1997     Model #: [id number]     Serial #: [id number]     Status: active  Indications::   VT   ICD Follow Up Remote Check?  No Battery Voltage:  2.53 V     Charge Time:  14.3 seconds     Battery Est. Longevity:  MOL2 ICD Dependent:  No       ICD Device Measurements Right Ventricle:  Amplitude: 9.1 mV, Impedance: 705 ohms, Threshold: 1.2 V at 0.8 msec Shock Impedance: 49 ohms   Episodes Coumadin:  No Shock:  0     ATP:  0     Nonsustained:  0     Ventricular Pacing:  2%  Brady Parameters Mode VVI     Lower Rate Limit:  50      Tachy Zones VF:  210     VT:  185     Next Cardiology Appt Due:  07/05/2010 Tech Comments:  No parameter changes.  Battery near ERI.  ROV 3 months with Dr. [name]. Altha Harm, LPN  April 08, 2010 10:04 AM

## 2010-08-04 NOTE — Assessment & Plan Note (Signed)
Summary: REFILL MEDICATION/CLE   Vital Signs:  Patient Profile:   60 Years Old Male Weight:      178 pounds Temp:     98 degrees F oral Pulse rate:   100 / minute Pulse rhythm:   regular BP sitting:   130 / 90  (left arm) Cuff size:   regular  Vitals Entered By: Delilah Shan (October 11, 2007 9:37 AM)                 Chief Complaint:  Refill medication.  History of Present Illness: Intermittant severe  low back pain returned, no radiation no currently worsened when bending over and picking up things no specific injury or fall thtat has made it act back up Has been on skelaxin 800 mg  in past in 2006 wants refill of this medication t use prn no numbness , weakness     Current Allergies (reviewed today): PENICILLIN G POTASSIUM (PENICILLIN G POTASSIUM)     Review of Systems  GI      Complains of bloody stools.      Denies abdominal pain, change in bowel habits, constipation, and diarrhea.      ? hemmorhoid/rectal mass protruding present for years, somewhat irritated   Physical Exam  General:     Well-developed,well-nourished,in no acute distress; alert,appropriate and cooperative throughout examination   Detailed Back/Spine Exam  Gait:    Normal heel-toe gait pattern bilaterally.    Skin:    Intact, no scars, lesions, rashes, cafe'-au-lait spots, or bruising.    Lumbosacral Exam:  Inspection-deformity:    Normal Palpation-spinal tenderness:  Abnormal    Location:  L4-L5 Lying Straight Leg Raise:    Right:  negative    Left:  negative Sitting Straight Leg Raise:    Right:  negative    Left:  negative Fabere Test:    Right:  negative    Left:  negative    Impression & Recommendations:  Problem # 1:  LOW BACK PAIN SYNDROME (ICD-724.2) musculoskeletal. May uses NSAIDS, heat, muscle relaxant as needed pain.  Given info on back care and prevention of back injury as well as exercises. His updated medication list for this problem includes:  Acetaminophen 325 Mg Tabs (Acetaminophen) .Marland Kitchen... Take 1 tablet by mouth once a day    Skelaxin 800 Mg Tabs (Metaxalone) .Marland Kitchen... 1 tab by mouth three times a day as needed pain/muscle spasm   Complete Medication List: 1)  Fexofenadine Hcl 180 Mg Tabs (Fexofenadine hcl) .... Take 1 tablet by mouth once a day 2)  Zetia 10 Mg Tabs (Ezetimibe) .... Take 1 tab by mouth at bedtime 3)  Lexapro 10 Mg Tabs (Escitalopram oxalate) .... Take 1 tab by mouth at bedtime 4)  Aricept 5 Mg Tabs (Donepezil hcl) .... Take 1 tab by mouth at bedtime 5)  Niaspan 500 Mg Tbcr (Niacin (antihyperlipidemic)) .... Take 1 tab by mouth at bedtime 6)  Crestor 10 Mg Tabs (Rosuvastatin calcium) .... Take 1 tab by mouth at bedtime 7)  Taztia Xt 300 Mg Cp24 (Diltiazem hcl er beads) .... Take 1 tablet by mouth every morning 8)  Adderall 10 Mg Tabs (Amphetamine-dextroamphetamine) .... Take 1 tablet by mouth two times a day 9)  Multivitamins Tabs (Multiple vitamin) .... Take 1 tablet by mouth once a day 10)  Acetaminophen 325 Mg Tabs (Acetaminophen) .... Take 1 tablet by mouth once a day 11)  Ginkgo Biloba Extr (Ginkgo biloba) .... Take 1 tablet by mouth two times a  day 12)  Calcium Carbonate-vitamin D 600-400 Mg-unit Tabs (Calcium carbonate-vitamin d) .... Take 1 tablet by mouth every morning 13)  Fish Oil 1000 Mg Caps (Omega-3 fatty acids) .... Take 1 tablet by mouth every morning 14)  Flax Oil (Flaxseed (linseed)) .... Take 1 tablet by mouth every morning 15)  Glucosamine-chondroitin Caps (Glucosamine-chondroit-vit c-mn) .... Take 1 tablet by mouth two times a day 16)  Skelaxin 800 Mg Tabs (Metaxalone) .Marland Kitchen.. 1 tab by mouth three times a day as needed pain/muscle spasm   Patient Instructions: 1)  Follow up appt inn next few weeks for rectal exam/anoscopy.    Prescriptions: SKELAXIN 800 MG  TABS (METAXALONE) 1 tab by mouth three times a day as needed pain/muscle spasm  #30 x 0   Entered and Authorized by:   Kerby Nora MD    Signed by:   Kerby Nora MD on 10/11/2007   Method used:   Print then Give to Patient   RxID:   [id number]  ] Current Allergies (reviewed today): PENICILLIN G POTASSIUM (PENICILLIN G POTASSIUM) Current Medications (including changes made in today's visit):  FEXOFENADINE HCL 180 MG TABS (FEXOFENADINE HCL) Take 1 tablet by mouth once a day ZETIA 10 MG  TABS (EZETIMIBE) Take 1 tab by mouth at bedtime LEXAPRO 10 MG  TABS (ESCITALOPRAM OXALATE) Take 1 tab by mouth at bedtime ARICEPT 5 MG  TABS (DONEPEZIL HCL) Take 1 tab by mouth at bedtime NIASPAN 500 MG  TBCR (NIACIN (ANTIHYPERLIPIDEMIC)) Take 1 tab by mouth at bedtime CRESTOR 10 MG  TABS (ROSUVASTATIN CALCIUM) Take 1 tab by mouth at bedtime TAZTIA XT 300 MG  CP24 (DILTIAZEM HCL ER BEADS) Take 1 tablet by mouth every morning ADDERALL 10 MG  TABS (AMPHETAMINE-DEXTROAMPHETAMINE) Take 1 tablet by mouth two times a day MULTIVITAMINS   TABS (MULTIPLE VITAMIN) Take 1 tablet by mouth once a day ACETAMINOPHEN 325 MG  TABS (ACETAMINOPHEN) Take 1 tablet by mouth once a day GINKGO BILOBA   EXTR (GINKGO BILOBA) Take 1 tablet by mouth two times a day CALCIUM CARBONATE-VITAMIN D 600-400 MG-UNIT  TABS (CALCIUM CARBONATE-VITAMIN D) Take 1 tablet by mouth every morning FISH OIL 1000 MG  CAPS (OMEGA-3 FATTY ACIDS) Take 1 tablet by mouth every morning FLAX   OIL (FLAXSEED (LINSEED)) Take 1 tablet by mouth every morning GLUCOSAMINE-CHONDROITIN   CAPS (GLUCOSAMINE-CHONDROIT-VIT C-MN) Take 1 tablet by mouth two times a day SKELAXIN 800 MG  TABS (METAXALONE) 1 tab by mouth three times a day as needed pain/muscle spasm

## 2010-08-04 NOTE — Assessment & Plan Note (Signed)
Summary: 9:00 APPT/AMD      Allergies Added:   Visit Type:  Follow-up Referring Provider:  Randolm Idol Primary Provider:  Dr. Ermalene Searing  CC:  Pacer check/ question if needs a generator change out.Marland Kitchen  History of Present Illness: Larry [name] is seen in followup for  an ICD in implanted for aborted cardiac arrest.  He is s/ p iCD uimplantatiton   He has residual neurologic deficit r This occurred in the setting of antidysrhythmic therapy for symptomatic PVCs suppression.  Cardiac catheterization at the time of his arrest demonstrated normal coronary arteries. Echocardiogram performed in 2010 showed normal left ventricular function with an LVEF of 60%.  His device has reached ERI  The patient denies SOB, chest pain, edema or palpitations  Difficulties surround concentrating   The pt was started on low-dose Toprol XL at the time of his last visit because of tachycardia. He has tolerated this well. The patient denies chest pain, dyspnea, orthopnea, PND, edema, palpitations, lightheadedness, or syncope.   Current Medications (verified): 1)  Fexofenadine Hcl 180 Mg Tabs (Fexofenadine Hcl) .... Take 1 Tablet By Mouth Once A Day 2)  Aricept 5 Mg  Tabs (Donepezil Hcl) .... Take 1 Tab By Mouth At Bedtime 3)  Crestor 10 Mg  Tabs (Rosuvastatin Calcium) .... Take 1 Tab By Mouth Every Day At Bedtime 4)  Taztia Xt 300 Mg  Cp24 (Diltiazem Hcl Er Beads) .... Take 1 Tablet By Mouth Every Morning 5)  Adderall Xr 15 Mg Xr24h-Cap (Amphetamine-Dextroamphetamine) .... Take 1 Tablet By Mouth Once A Day 6)  Multivitamins   Tabs (Multiple Vitamin) .... Take 1 Tablet By Mouth Once A Day 7)  Ginkgo Biloba   Extr (Ginkgo Biloba) .... Take 1 Tablet By Mouth Two Times A Day 8)  Calcium Carbonate-Vitamin D 600-400 Mg-Unit  Tabs (Calcium Carbonate-Vitamin D) .... Take 1 Tablet By Mouth Once in A While 9)  Fish Oil 1000 Mg  Caps (Omega-3 Fatty Acids) .... Take 1 Tablet By Mouth Every Morning 10)  Skelaxin 800 Mg  Tabs  (Metaxalone) .Marland Kitchen.. 1 Tab By Mouth Three Times A Day As Needed Pain/muscle Spasm 11)  Aspirin 325 Mg  Tabs (Aspirin) .... Take 1 Tablet By Mouth Once A Day 12)  Focus Factor 692mg  .... 1 Tablet Two Times A Day 13)  Vitamin C 500 Mg  Tabs (Ascorbic Acid) .... Take One Tablet By Mouth Once Daily. 14)  Vitamin E 400 Unit Caps (Vitamin E) .... Once Daily 15)  Metoprolol Succinate 25 Mg Xr24h-Tab (Metoprolol Succinate) .... Take One Tablet By Mouth Daily  Allergies (verified): 1)  Penicillin G Potassium (Penicillin G Potassium)  Past History:  Past Medical History: Last updated: December 21, 2008 SUDDEN DEATH (ICD-798.1) CARDIAC ARRHYTHMIA (ICD-427.9) HYPERLIPIDEMIA (ICD-272.4) ELEVATED BP READING WITHOUT DX HYPERTENSION (ICD-796.2) CONTACT DERMATITIS&OTHER ECZEMA DUE TO PLANTS (ICD-692.6) IDIOPATHIC URTICARIA (ICD-708.1) MELANOMA, CHEST (ICD-172.9) ADD (ICD-314.00) SLEEP APNEA (ICD-780.57) DEMENTIA, POST RESUSCITATIVE (ICD-294.8) HYPOTHYROIDISM (ICD-244.9) HEADACHE (ICD-784.0) DEPRESSION (ICD-311) LOW BACK PAIN SYNDROME (ICD-724.2)  Past Surgical History: Last updated: 10/16/2009 10/30/02 Defibrillative change [name])   hemorrhoidectomy  Implantable cardioverter-defibrillator implantation Tonsillectomy  Family History: Last updated: 10/16/2009 HTN  - mother CAD - father  Social History: Last updated: 10/16/2009 married 2 daughters 4 grandchildren and one on the way Pt. has a history of smoking 1 pack per day for 10 years, but quit 1994  Risk Factors: Exercise: yes (11/20/2008)  Risk Factors: Smoking Status: never (11/20/2008)  Vital Signs:  Patient profile:   60 year old male Height:  70 inches Weight:      179 pounds BMI:     25.78 Pulse rate:   80 / minute BP sitting:   110 / 78  (left arm)  Vitals Entered By: Bishop Dublin, CMA (June 09, 2010 9:10 AM)  Physical Exam  General:  The patient was alert and oriented in no acute distress. HEENT Normal.  Neck  veins were flat, carotids were brisk.  Lungs were clear.  Heart sounds were regular without murmurs or gallops.  Abdomen was soft with active bowel sounds. There is no clubbing cyanosis or edema. Skin Warm and dry    EKG  Procedure date:  06/09/2010  Findings:      SR w PVC 74 .14/.10/.40   ICD Specifications Following MD:  Sherryl Manges, MD     ICD Vendor:  [facility] [employer]     ICD Model Number:  [phone]     ICD Serial Number:  [id number] ICD DOI:  10/30/2002     ICD Implanting MD:  Sherryl Manges, MD  Lead 1:    Location: RV     DOI: 08/08/1997     Model #: [id number]     Serial #: [id number]     Status: active  Indications::  VT   ICD Follow Up ICD Dependent:  No      Episodes Coumadin:  No  Brady Parameters Mode VVI     Lower Rate Limit:  50      Tachy Zones VF:  210     VT:  185     Impression & Recommendations:  Problem # 1:  AUTOMATIC IMPLANTABLE CARDIAC DEFIBRILLATOR SITU (ICD-V45.02) Device parameters and data were reviewed and no changes were made  device has reachedERI   we discussed generator replacement including timing and risk, incl, but not limited to death infectionand lead fracture theywould like to go after christmas;  ERi earelynov, so no problems wat6ing till nexxt year  Problem # 2:  UNSPECIFIED TACHYCARDIA (ICD-785.0) improved on beta blocckers  Problem # 3:  SUDDEN DEATH (ICD-798.1) no recruuent arrhtynmia  Patient Instructions: 1)  Your physician recommends that you continue on your current medications as directed. Please refer to the Current Medication list given to you today. 2)  Your physician has recommended that you have a defibrillator changeout in Jan 2012.

## 2010-08-06 NOTE — Progress Notes (Signed)
  Phone Note From Other Clinic   Caller: [facility] Details for Reason: ifob  Summary of Call: FYI Patient never returned ifob kit, called the patient today He will bring in ASAP.  Initial call taken by: Mills Koller,  July 17, 2010 10:46 AM

## 2010-08-06 NOTE — Procedures (Signed)
Summary: WOUND CHECK/AMD      Allergies Added:   Current Medications (verified): 1)  Fexofenadine Hcl 180 Mg Tabs (Fexofenadine Hcl) .... Take 1 Tablet By Mouth Once A Day 2)  Aricept 5 Mg  Tabs (Donepezil Hcl) .... Take 1 Tab By Mouth At Bedtime 3)  Crestor 10 Mg  Tabs (Rosuvastatin Calcium) .... Take 1 Tab By Mouth Every Day At Bedtime 4)  Taztia Xt 300 Mg  Cp24 (Diltiazem Hcl Er Beads) .... Take 1 Tablet By Mouth Every Morning 5)  Multivitamins   Tabs (Multiple Vitamin) .... Take 1 Tablet By Mouth Once A Day 6)  Ginkgo Biloba   Extr (Ginkgo Biloba) .... Take 1 Tablet By Mouth Two Times A Day 7)  Calcium Carbonate-Vitamin D 600-400 Mg-Unit  Tabs (Calcium Carbonate-Vitamin D) .... Take 1 Tablet By Mouth Once in A While 8)  Fish Oil 1000 Mg  Caps (Omega-3 Fatty Acids) .... Take 1 Tablet By Mouth Every Morning 9)  Aspirin 325 Mg  Tabs (Aspirin) .... Take 1 Tablet By Mouth Once A Day 10)  Focus Factor 692mg  .... 1 Tablet Two Times A Day 11)  Vitamin C 500 Mg  Tabs (Ascorbic Acid) .... Take One Tablet By Mouth Once Daily. 12)  Vitamin E 400 Unit Caps (Vitamin E) .... Once Daily 13)  Metoprolol Succinate 25 Mg Xr24h-Tab (Metoprolol Succinate) .... Take One Tablet By Mouth Daily 14)  Amphetamine-Dextroamphetamine 10 Mg Tabs (Amphetamine-Dextroamphetamine) .Marland Kitchen.. 1 By Mouth Two Times A Day  Allergies (verified): 1)  Penicillin G Potassium (Penicillin G Potassium)    ICD Specifications Following MD:  Sherryl Manges, MD     ICD Vendor:  [facility] [employer]     ICD Model Number:  E102     ICD Serial Number:  [id number] ICD DOI:  07/01/2010     ICD Implanting MD:  Sherryl Manges, MD  Lead 1:    Location: RV     DOI: 08/08/1997     Model #: [id number]     Serial #: [id number]     Status: active  Indications::  VT  Explantation Comments: 07/01/2010 [facility] [employer] Prizm [id number]/[id number] explanted.  ICD Follow Up Remote Check?  No Battery Voltage:  BOL V     Charge Time:  8.3 seconds     Battery Est. Longevity:  11  years Underlying rhythm:  sinus @ 110 ICD Dependent:  No       ICD Device Measurements Right Ventricle:  Amplitude: 15.2 mV, Impedance: 871 ohms, Threshold: 1.3 V at 0.8 msec Shock Impedance: 64 ohms   Episodes Coumadin:  No Shock:  1     ATP:  0     Nonsustained:  0     Ventricular Pacing:  15%  Brady Parameters Mode VVI     Lower Rate Limit:  50      Tachy Zones VF:  210     VT:  185     Tech Comments:  Steri strips removed, no edema, redness noted from tape irritation around his incision.  No parameter changes.  Device function normal.   ROV 3 months with Dr. [name] in [place]. Altha Harm, LPN  July 10, 2010 2:27 PM

## 2010-08-06 NOTE — Letter (Signed)
Summary: Implantable Device Instructions  Architectural technologist at [facility]   [place], [place] [address]   Phone: [phone]  Fax: [phone]      Implantable Device Instructions  You are scheduled for:  _____ Permanent Transvenous Pacemaker _____ Implantable Cardioverter Defibrillator _____ Implantable Loop Recorder __X___ Generator Change  on 07/01/10 with Dr. [name].  1.  Please arrive at the Short Stay Center at [facility] at 6:00am on the day of your procedure.  2.  Do not eat or drink the night before your procedure.  3.  Complete labwork 06/24/10.  4.  You may take AM medications with a sip of water.  5.  Plan for an overnight stay.  Bring your insurance cards and a list of your medications.  6.  Wash your chest and neck with antibacterial soap (any brand) the evening before and the morning of your procedure.  Rinse well.   *If you have ANY questions after you get home, please call the office [phone].  *Every attempt is made to prevent procedures from being rescheduled.  Due to the nauture of Electrophysiology, rescheduling can happen.  The physician is always aware and directs the staff when this occurs.

## 2010-08-06 NOTE — Progress Notes (Signed)
Summary: refill request for adderall  Phone Note Refill Request Call back at Home Phone [phone] Message from:  Patient  Refills Requested: Medication #1:  ADDERALL XR 15 MG XR24H-CAP Take 1 tablet by mouth once a day Phoned request from pt.  [facility] supply of plain 10 mg's so he wants to go back to taking that twice a day.  Please call when ready.  Initial call taken by: Lowella Petties CMA, AAMA,  June 23, 2010 10:17 AM  Follow-up for Phone Call        reasonable  Dr. Ermalene Searing is out of the office this week, so I will fill this controlled substance in her absence.   Follow-up by: Hannah Beat MD,  June 23, 2010 10:50 AM    New/Updated Medications: AMPHETAMINE-DEXTROAMPHETAMINE 10 MG TABS (AMPHETAMINE-DEXTROAMPHETAMINE) 1 by mouth two times a day Prescriptions: AMPHETAMINE-DEXTROAMPHETAMINE 10 MG TABS (AMPHETAMINE-DEXTROAMPHETAMINE) 1 by mouth two times a day  #60 x 0   Entered and Authorized by:   Hannah Beat MD   Signed by:   Hannah Beat MD on 06/23/2010   Method used:   Print then Give to Patient   RxID:   [id number]   Appended Document: refill request for adderall Patient advised.Consuello Masse CMA

## 2010-08-06 NOTE — Miscellaneous (Signed)
Summary: Device change out  Clinical Lists Changes  Observations: Added new observation of ICD IMPL DTE: 07/01/2010 (07/02/2010 13:38) Added new observation of ICD SERL#: 098119  (07/02/2010 13:38) Added new observation of ICD MODL#: E102  (07/02/2010 13:38) Added new observation of ICDEXPLCOMM: 07/01/2010 [employer] [id number]/[id number] explanted.  (07/02/2010 13:38)       ICD Specifications Following MD:  Sherryl Manges, MD     ICD Vendor:  [facility] [employer]     ICD Model Number:  E102     ICD Serial Number:  [id number] ICD DOI:  07/01/2010     ICD Implanting MD:  Sherryl Manges, MD  Lead 1:    Location: RV     DOI: 08/08/1997     Model #: 0135     Serial #: [id number]     Status: active  Indications::  VT  Explantation Comments: 07/01/2010 [facility] [employer] [id number]/[id number] explanted.  ICD Follow Up ICD Dependent:  No      Episodes Coumadin:  No  Brady Parameters Mode VVI     Lower Rate Limit:  50      Tachy Zones VF:  210     VT:  185

## 2010-08-06 NOTE — Letter (Signed)
Summary: Nature conservation officer [employer] Wellness Visit Questionnaire   [employer] Annual Wellness Visit Questionnaire   Imported By: Beau Fanny 06/15/2010 10:51:34  _____________________________________________________________________  External Attachment:    Type:   Image     Comment:   External Document

## 2010-08-06 NOTE — Letter (Signed)
Summary: New Patient letter  [facility]  [address] [place], [place] [address]   Phone: [phone]  Fax: [phone]       07/24/2010 MRN: [id number]  Larry [name] [address] [place], [place]  [address]  Dear Mr. Sarti,  Welcome to the Gastroenterology Division at [facility].    You are scheduled to see Dr.  Russella Dar on 08-31-10 at 10:15am on the 3rd floor at [facility], [address]  We ask that you try to arrive at our office 15 minutes prior to your appointment time to allow for check-in.  We would like you to complete the enclosed self-administered evaluation form prior to your visit and bring it with you on the day of your appointment.  We will review it with you.  Also, please bring a complete list of all your medications or, if you prefer, bring the medication bottles and we will list them.  Please bring your insurance card so that we may make a copy of it.  If your insurance requires a referral to see a specialist, please bring your referral form from your primary care physician.  Co-payments are due at the time of your visit and may be paid by cash, check or credit card.     Your office visit will consist of a consult with your physician (includes a physical exam), any laboratory testing he/she may order, scheduling of any necessary diagnostic testing (e.g. x-ray, ultrasound, CT-scan), and scheduling of a procedure (e.g. Endoscopy, Colonoscopy) if required.  Please allow enough time on your schedule to allow for any/all of these possibilities.    If you cannot keep your appointment, please call [phone] to cancel or reschedule prior to your appointment date.  This allows [place] the opportunity to schedule an appointment for another patient in need of care.  If you do not cancel or reschedule by 5 p.m. the business day prior to your appointment date, you will be charged a $50.00 late cancellation/no-show fee.    Thank you for choosing   Gastroenterology for your medical needs.  We appreciate the opportunity to care for you.  Please visit [place] at our website  to learn more about our practice.                     Sincerely,                                                             The Gastroenterology Division

## 2010-08-06 NOTE — Progress Notes (Signed)
Summary: refill request for adderall  Phone Note Refill Request Call back at Home Phone [phone] Message from:  Patient  Refills Requested: Medication #1:  AMPHETAMINE-DEXTROAMPHETAMINE 10 MG TABS 1 by mouth two times a day. Please call pt when ready.  Initial call taken by: Lowella Petties CMA, AAMA,  July 23, 2010 9:54 AM  Follow-up for Phone Call        in your outbox Follow-up by: Benny Lennert CMA Duncan Dull),  July 24, 2010 7:53 AM    Prescriptions: AMPHETAMINE-DEXTROAMPHETAMINE 10 MG TABS (AMPHETAMINE-DEXTROAMPHETAMINE) 1 by mouth two times a day  #60 x 0   Entered by:   Benny Lennert CMA (AAMA)   Authorized by:   Kerby Nora MD   Signed by:   Benny Lennert CMA (AAMA) on 07/24/2010   Method used:   Print then Give to Patient   RxID:   [id number]

## 2010-08-12 NOTE — Cardiovascular Report (Signed)
Summary: Office Visit   Office Visit   Imported By: Roderic Ovens 08/05/2010 13:55:50  _____________________________________________________________________  External Attachment:    Type:   Image     Comment:   External Document

## 2010-08-18 ENCOUNTER — Encounter: Payer: Self-pay | Admitting: Cardiovascular Disease

## 2010-08-18 ENCOUNTER — Ambulatory Visit (INDEPENDENT_AMBULATORY_CARE_PROVIDER_SITE_OTHER): Payer: Medicare Other | Admitting: Cardiovascular Disease

## 2010-08-18 DIAGNOSIS — R000 Tachycardia, unspecified: Secondary | ICD-10-CM

## 2010-08-18 DIAGNOSIS — E7800 Pure hypercholesterolemia, unspecified: Secondary | ICD-10-CM

## 2010-08-27 ENCOUNTER — Telehealth: Payer: Self-pay | Admitting: Internal Medicine

## 2010-08-31 ENCOUNTER — Ambulatory Visit (INDEPENDENT_AMBULATORY_CARE_PROVIDER_SITE_OTHER): Payer: Medicare Other | Admitting: Gastroenterology

## 2010-08-31 ENCOUNTER — Encounter: Payer: Self-pay | Admitting: Gastroenterology

## 2010-08-31 DIAGNOSIS — R195 Other fecal abnormalities: Secondary | ICD-10-CM

## 2010-08-31 DIAGNOSIS — K921 Melena: Secondary | ICD-10-CM

## 2010-09-01 NOTE — Op Note (Signed)
Summary: Examination under anesthesia, Anoscopy, Excision anal polyp  NAME:  Larry [name], Larry [name]              ACCOUNT NO.:  [id number]      MEDICAL RECORD NO.:  [id number]          PATIENT TYPE:  AMB      LOCATION:  SDS                          FACILITY:  [facility]      PHYSICIAN:  Velora Heckler, MD      DATE OF BIRTH:  1951-04-13      DATE OF PROCEDURE:  01/29/2008   DATE OF DISCHARGE:  01/29/2008                                  OPERATIVE REPORT      PREOPERATIVE DIAGNOSIS:  Anal polyp, internal hemorrhoids.      POSTOPERATIVE DIAGNOSIS:  Anal polyp, internal hemorrhoids.      PROCEDURE:   1. Examination under anesthesia.   2. Anoscopy.   3. Excision anal polyp (2)      SURGEON:  Velora Heckler, MD, FACS      ANESTHESIA:  General per Dr. Arta Bruce.      ESTIMATED BLOOD LOSS:  Minimal.      PREPARATION:  Betadine.      COMPLICATIONS:  None.      INDICATIONS:  The patient is a 60 year old white male from [place],   [place], with a history of internal hemorrhoids and an anal polyp   gradually increasing in size.  The patient has a significant cardiac   history.  He now comes to Surgery for exam under anesthesia and excision   of anal polyps.      BODY OF REPORT:  Procedure was done in OR #17 at the [facility].  The patient was brought to the operating room and   placed in supine position on the operating room table.  Following   administration of general anesthesia, the patient was positioned in   lithotomy and prepped and draped in the usual strict aseptic fashion.   After ascertaining that an adequate level of anesthesia had been   achieved, a digital rectal exam was performed.  No palpable masses.  Two   moderate-sized anal polyps were visible on the left side of the anal   orifice.  Anoscopy was performed with a large speculum.  There were   grade 2 internal hemorrhoids.  Lower rectal mucosa appears normal.  No   other gross abnormality  was identified.      Using the electrocautery, both anal polyps on the left side of the anal   verge were excised.  These were both submitted to pathology.  The   mucosal defect was closed with interrupted 3-0 chromic gut sutures.   Local anesthetic   with epinephrine was injected.  Dry gauze was placed as a dressing.  The   patient was taken out of lithotomy and awakened from anesthesia.  He was   brought to the recovery room in stable condition.  The patient tolerated   the procedure well.               Velora Heckler, MD   Electronically Signed  TMG/MEDQ  D:  01/29/2008  T:  01/30/2008  Job:  [id number]      cc:   Madaline Savage, M.D.   Kerby Nora, MD   [facility]   Appended Document: Examination under anesthesia, Anoscopy, Excision anal polyp SP-Surgical Pathology - STATUS: Final  .                                         Perform Date: 27Jul09 00:01  Ordered By: Clancy Gourd,           Ordered Date: 27Jul09 12:40  Facility: [facility]                              Department: CPATH  Service Report Text  [facility]   [address]   [place], [place] [address]   [phone]    REPORT OF SURGICAL PATHOLOGY    Case #: [id number]   Patient Name: Larry [name], Larry [name]   Office Chart Number: N/A    MRN: [id number]   Pathologist: Beulah Gandy. Luisa Hart, MD   DOB/Age [birth year] (Age: 73) Gender: M   Date Taken: 01/29/2008   Date Received: 01/29/2008    FINAL DIAGNOSIS    ***MICROSCOPIC EXAMINATION AND DIAGNOSIS***    ANAL POLYPS X2: BENIGN FIBROEPITHELIAL POLYPS (SKIN TAG).    mj   Date Reported: 01/30/2008 Beulah Gandy. Luisa Hart, MD   *** Electronically Signed Out By JDP ***    Clinical information   Anal polyps X 2; internal hemorrhoids; skin tags; anal mass (lmb)    specimen(s) obtained   Anus, biopsy    Gross Description   Received in formalin are two polypoid portion of soft, pink   tissue with smooth to wrinkled  external surfaces, that measuring   1 x 0.5 and 2.2 cm x 0.8 cm in greatest dimensions.   Block summary: the larger specimen is bisected and all material   is submitted in one cassette. (JBM:gt, 01/29/08)    gdt/   Additional Information  HL7 RESULT STATUS : F  External IF Update Timestamp : 2008-01-29:12:42:00.000000

## 2010-09-01 NOTE — Progress Notes (Signed)
Summary: Switch Providers   Phone Note Call from Patient   Caller: Patient Call For: Dr. Russella Dar Reason for Call: Talk to Nurse Summary of Call: Previous GI Hx w/Dr. Juanda Chance and asking to switch from Dr. Juanda Chance to Dr. Russella Dar because he prefers a male physician.  Initial call taken by: Karna Christmas,  August 27, 2010 9:20 AM  Follow-up for Phone Call        yes.DB Follow-up by: Hart Carwin MD,  August 27, 2010 10:09 AM  Additional Follow-up for Phone Call Additional follow up Details #1::        Dr. Russella Dar,  Is this ok with you? Additional Follow-up by: Jesse Fall RN,  August 27, 2010 10:21 AM    Additional Follow-up for Phone Call Additional follow up Details #2::    Ok with me. Follow-up by: Meryl Dare MD Clementeen Graham,  August 27, 2010 10:58 AM

## 2010-09-03 ENCOUNTER — Telehealth (INDEPENDENT_AMBULATORY_CARE_PROVIDER_SITE_OTHER): Payer: Self-pay | Admitting: *Deleted

## 2010-09-08 ENCOUNTER — Telehealth: Payer: Self-pay | Admitting: Family Medicine

## 2010-09-10 NOTE — Letter (Signed)
Summary: [facility] Instructions  [facility]  [address] [place], [place] [address]   Phone: [phone]  Fax: [phone]       Larry [name]    09/21/1950    MRN: [id number]        Procedure Day /Date: Tuesday April 3rd, 2012     Arrival Time: 9:30am     Procedure Time: 10:30am     Location of Procedure:                    _ x_   Endoscopy Center (4th Floor)                        PREPARATION FOR COLONOSCOPY WITH MOVIPREP   Starting 5 days prior to your procedure 10/02/10 do not eat nuts, seeds, popcorn, corn, beans, peas,  salads, or any raw vegetables.  Do not take any fiber supplements (e.g. Metamucil, Citrucel, and Benefiber).  THE DAY BEFORE YOUR PROCEDURE         DATE:10/05/10  UUV:OZDGUY  1.  Drink clear liquids the entire day-NO SOLID FOOD  2.  Do not drink anything colored red or purple.  Avoid juices with pulp.  No orange juice.  3.  Drink at least 64 oz. (8 glasses) of fluid/clear liquids during the day to prevent dehydration and help the prep work efficiently.  CLEAR LIQUIDS INCLUDE: Water Jello Ice Popsicles Tea (sugar ok, no milk/cream) Powdered fruit flavored drinks Coffee (sugar ok, no milk/cream) Gatorade Juice: apple, white grape, white cranberry  Lemonade Clear bullion, consomm, broth Carbonated beverages (any kind) Strained chicken noodle soup Hard Candy                             4.  In the morning, mix first dose of MoviPrep solution:    Empty 1 Pouch A and 1 Pouch B into the disposable container    Add lukewarm drinking water to the top line of the container. Mix to dissolve    Refrigerate (mixed solution should be used within 24 hrs)  5.  Begin drinking the prep at 5:00 p.m. The MoviPrep container is divided by 4 marks.   Every 15 minutes drink the solution down to the next mark (approximately 8 oz) until the full liter is complete.   6.  Follow completed prep with 16 oz of clear liquid of your choice  (Nothing red or purple).  Continue to drink clear liquids until bedtime.  7.  Before going to bed, mix second dose of MoviPrep solution:    Empty 1 Pouch A and 1 Pouch B into the disposable container    Add lukewarm drinking water to the top line of the container. Mix to dissolve    Refrigerate  THE DAY OF YOUR PROCEDURE      DATE: 10/06/10 DAY: Tuesday  Beginning at 5:30 a.m. (5 hours before procedure):         1. Every 15 minutes, drink the solution down to the next mark (approx 8 oz) until the full liter is complete.  2. Follow completed prep with 16 oz. of clear liquid of your choice.    3. You may drink clear liquids until 8:30am (2 HOURS BEFORE PROCEDURE).   MEDICATION INSTRUCTIONS  Unless otherwise instructed, you should take regular prescription medications with a small sip of water   as early as possible the morning of your procedure.  OTHER INSTRUCTIONS  You will need a responsible adult at least 58 years of age to accompany you and drive you home.   This person must remain in the waiting room during your procedure.  Wear loose fitting clothing that is easily removed.  Leave jewelry and other valuables at home.  However, you may wish to bring a book to read or  an iPod/MP3 player to listen to music as you wait for your procedure to start.  Remove all body piercing jewelry and leave at home.  Total time from sign-in until discharge is approximately 2-3 hours.  You should go home directly after your procedure and rest.  You can resume normal activities the  day after your procedure.  The day of your procedure you should not:   Drive   Make legal decisions   Operate machinery   Drink alcohol   Return to work  You will receive specific instructions about eating, activities and medications before you leave.    The above instructions have been reviewed and explained to me by   Estill Bamberg.     I fully understand and can verbalize these  instructions _____________________________ Date _________

## 2010-09-10 NOTE — Assessment & Plan Note (Signed)
Summary: ROV      Allergies Added:   Visit Type:  Follow-up Referring Provider:  Randolm Idol Primary Provider:  Dr. Ermalene Searing  CC:  none.  History of Present Illness: This is a 60 year old gentleman presenting for follow-up evaluation. The patient is followed by Dr. [name] after treatment with an ICD in 1999. 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 He has normal coronaries and preserved LV function.  The patient is doing well without complaints today. He denies chest pain, dyspnea, edema, orthopnea, or PND. No palpitations, lightheadedness, or syncope.  Current Medications (verified): 1)  Fexofenadine Hcl 180 Mg Tabs (Fexofenadine Hcl) .... Take 1 Tablet By Mouth Once A Day 2)  Aricept 5 Mg  Tabs (Donepezil Hcl) .... Take 1 Tab By Mouth At Bedtime 3)  Crestor 10 Mg  Tabs (Rosuvastatin Calcium) .... Take 1 Tab By Mouth Every Day At Bedtime 4)  Taztia Xt 300 Mg  Cp24 (Diltiazem Hcl Er Beads) .... Take 1 Tablet By Mouth Every Morning 5)  Multivitamins   Tabs (Multiple Vitamin) .... Take 1 Tablet By Mouth Once A Day 6)  Ginkgo Biloba   Extr (Ginkgo Biloba) .... Take 1 Tablet By Mouth Two Times A Day 7)  Calcium Carbonate-Vitamin D 600-400 Mg-Unit  Tabs (Calcium Carbonate-Vitamin D) .... Take 1 Tablet By Mouth Once in A While 8)  Fish Oil 1000 Mg  Caps (Omega-3 Fatty Acids) .... Take 1 Tablet By Mouth Every Morning 9)  Aspirin 325 Mg  Tabs (Aspirin) .... Take 1 Tablet By Mouth Once A Day 10)  Focus Factor 692mg  .... 1 Tablet Two Times A Day 11)  Vitamin C 500 Mg  Tabs (Ascorbic Acid) .... Take One Tablet By Mouth Once Daily. 12)  Vitamin E 400 Unit Caps (Vitamin E) .... Once Daily 13)  Metoprolol Succinate 25 Mg Xr24h-Tab (Metoprolol Succinate) .... Take One Tablet By Mouth Daily 14)  Amphetamine-Dextroamphetamine 10 Mg Tabs (Amphetamine-Dextroamphetamine) .Marland Kitchen.. 1 By Mouth Two Times A Day  Allergies (verified): 1)  Penicillin G  Potassium (Penicillin G Potassium)  Past History:  Past medical history reviewed for relevance to current acute and chronic problems.  Past Medical History: Reviewed history from 12/13/2008 and no changes required. 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 HEENT: normal Neck: normal carotid upstrokes without bruits, JVP normal Chest: there is an area of mild redness at his ICD incisional site (appears to be a retained suture) Lungs: CTA CV: RRR without murmur or gallop Abd: soft, NT, positive BS, no bruit, no organomegaly Ext: no clubbing, cyanosis, or edema. peripheral pulses 2+ and equal Skin: warm and dry without rash    EKG  Procedure date:  08/18/2010  Findings:      NSR with ventricular bigeminy. Sinus rate 70 bpm.   ICD Specifications Following MD:  Sherryl Manges, MD     ICD Vendor:  [facility] [employer]     ICD Model Number:  E102     ICD Serial Number:  [id number] ICD DOI:  07/01/2010     ICD Implanting MD:  Sherryl Manges, MD  Lead 1:    Location: RV     DOI: 08/08/1997     Model #: [id number]     Serial #: [id number]     Status: active  Indications::  VT  Explantation Comments: 07/01/2010 [facility]  Scientific Prizm [id number] explanted.  ICD Follow Up ICD Dependent:  No      Episodes Coumadin:  No  Brady Parameters Mode VVI     Lower Rate Limit:  50      Tachy Zones VF:  210     VT:  185     Impression & Recommendations:  Problem # 1:  SUDDEN DEATH (ICD-798.1) Pt stable without lightheadedness, palpitations, or syncope. He recently underwent ICD generator changeout by Dr [name]. Continue regular followup.  Problem # 2:  HYPERLIPIDEMIA (ICD-272.4) Doing well on rosuvastatin - lipids at goal for patient without CAD.  His updated medication list for this problem includes:    Crestor 10 Mg Tabs (Rosuvastatin calcium) .Marland Kitchen... Take 1 tab by mouth every day at bedtime  CHOL: 201 (03/12/2010)   LDL: 93 (10/30/2008)   HDL: 50.40 (03/12/2010)   TG: 136.0 (03/12/2010)  Other Orders: EKG w/ Interpretation (93000)  Patient Instructions: 1)  Your physician recommends that you continue on your current medications as directed. Please refer to the Current Medication list given to you today. 2)  Your physician wants you to follow-up in: 6 MONTHS.  You will receive a reminder letter in the mail two months in advance. If you don't receive a letter, please call our office to schedule the follow-up appointment.

## 2010-09-10 NOTE — Progress Notes (Signed)
Summary: refill request for adderal  Phone Note Refill Request Call back at Home Phone [phone] Message from:  Patient  Refills Requested: Medication #1:  AMPHETAMINE-DEXTROAMPHETAMINE 10 MG TABS 1 by mouth two times a day Please call pt when ready, he says he needs to pick this up today.  Initial call taken by: Lowella Petties CMA, AAMA,  September 03, 2010 12:38 PM  Follow-up for Phone Call        it can wait until tomorrow. controlled substance contract with [facility].  Needs to never wait until last day for filling.   Follow-up by: Hannah Beat MD,  September 03, 2010 12:44 PM  Additional Follow-up for Phone Call Additional follow up Details #1::        patients wife advised.Consuello Masse CMA   Additional Follow-up by: Benny Lennert CMA Duncan Dull),  September 03, 2010 1:02 PM    Additional Follow-up for Phone Call Additional follow up Details #2::    rx printed.Consuello Masse CMA   Follow-up by: Benny Lennert CMA Duncan Dull),  September 04, 2010 7:38 AM  Additional Follow-up for Phone Call Additional follow up Details #3:: Details for Additional Follow-up Action Taken: rx signed by dr b and up front for pick up Additional Follow-up by: Benny Lennert CMA Duncan Dull),  September 04, 2010 10:01 AM  Prescriptions: AMPHETAMINE-DEXTROAMPHETAMINE 10 MG TABS (AMPHETAMINE-DEXTROAMPHETAMINE) 1 by mouth two times a day  #60 x 0   Entered by:   Benny Lennert CMA (AAMA)   Authorized by:   Kerby Nora MD   Signed by:   Benny Lennert CMA (AAMA) on 09/04/2010   Method used:   Print then Give to Patient   RxID:   [id number]

## 2010-09-10 NOTE — Assessment & Plan Note (Signed)
Summary: SCREEN FOR COLON POS HEM STOOL CARDS/YF - N GI HISTORY PER MA...   History of Present Illness Visit Type: Initial Consult Primary GI MD: Elie Goody MD [facility] Primary Provider: Kerby Nora, MD Requesting Provider: Kerby Nora, MD Chief Complaint: Patient had a hem pos stool test. He states that sometimes he has some rectal bleeding but its only on the surface not in the colon.  History of Present Illness:    This is a 60 year old male here today with his wife. He complains of small amounts of bright red blood on the tissue paper on rare occasions, maybe every 2-3 months. He has no other digestive complaints. He was recently found to have occult blood in his stool.   GI Review of Systems      Denies abdominal pain, acid reflux, belching, bloating, chest pain, dysphagia with liquids, dysphagia with solids, heartburn, loss of appetite, nausea, vomiting, vomiting blood, weight loss, and  weight gain.      Reports rectal bleeding.     Denies anal fissure, black tarry stools, change in bowel habit, constipation, diarrhea, diverticulosis, fecal incontinence, heme positive stool, hemorrhoids, irritable bowel syndrome, jaundice, light color stool, liver problems, and  rectal pain. Preventive Screening-Counseling & Management      Drug Use:  no.     Current Medications (verified): 1)  Fexofenadine Hcl 180 Mg Tabs (Fexofenadine Hcl) .... Take 1 Tablet By Mouth Once A Day 2)  Aricept 5 Mg  Tabs (Donepezil Hcl) .... Take 1 Tab By Mouth At Bedtime 3)  Crestor 10 Mg  Tabs (Rosuvastatin Calcium) .... Take 1 Tab By Mouth Every Day At Bedtime 4)  Taztia Xt 300 Mg  Cp24 (Diltiazem Hcl Er Beads) .... Take 1 Tablet By Mouth Every Morning 5)  Multivitamins   Tabs (Multiple Vitamin) .... Take 1 Tablet By Mouth Once A Day 6)  Ginkgo Biloba   Extr (Ginkgo Biloba) .... Take 1 Tablet By Mouth Two Times A Day 7)  Calcium Carbonate-Vitamin D 600-400 Mg-Unit  Tabs (Calcium Carbonate-Vitamin D) ....  Take 1 Tablet By Mouth Once in A While 8)  Fish Oil 1000 Mg  Caps (Omega-3 Fatty Acids) .... Take 1 Tablet By Mouth Every Morning 9)  Aspirin 325 Mg  Tabs (Aspirin) .... Take 1 Tablet By Mouth Once A Day 10)  Focus Factor 692mg  .... 1 Tablet Two Times A Day 11)  Vitamin C 500 Mg  Tabs (Ascorbic Acid) .... Take One Tablet By Mouth Once Daily. 12)  Vitamin E 400 Unit Caps (Vitamin E) .... Once Daily 13)  Metoprolol Succinate 25 Mg Xr24h-Tab (Metoprolol Succinate) .... Take One Tablet By Mouth Daily 14)  Amphetamine-Dextroamphetamine 10 Mg Tabs (Amphetamine-Dextroamphetamine) .Marland Kitchen.. 1 By Mouth Two Times A Day  Allergies (verified): 1)  Penicillin G Potassium (Penicillin G Potassium)  Past History:  Past Medical History: CARDIAC ARRHYTHMIA (ICD-427.9) HYPERLIPIDEMIA (ICD-272.4) IDIOPATHIC URTICARIA (ICD-708.1) MELANOMA, CHEST (ICD-172.9) ADD (ICD-314.00) SLEEP APNEA (ICD-780.57)  DEMENTIA, POST RESUSCITATIVE (ICD-294.8) HYPOTHYROIDISM (ICD-244.9) HEADACHE (ICD-784.0) DEPRESSION (ICD-311) LOW BACK PAIN SYNDROME (ICD-724.2) GERD  Past Surgical History: Hemorrhoidectomy AICD implantation 1999 Defibrillator change [name]) 2004 Tonsillectomy Explantation of a previously implanted device, pocket   revision, and insertion of a new device, and intraoperative   defibrillation threshold testing.  07/01/2010  Duke Salvia, MD, [facility]   Family History: HTN  - mother CAD, Diverticulosis - father Family History of Diabetes: brother No FH of Colon Cancer: Family History of Lung Csncer: Paternal grandfather (smoker)  Social  History: married 2 daughters 4 grandchildren and one on the way Pt. has a history of smoking 1 pack per day for 10 years, but quit 1994 Daily Caffeine Use one cup coffe and one coke per day Illicit Drug Use - no Drug Use:  no  Review of Systems       The patient complains of allergy/sinus, arthritis/joint pain, fatigue, and heart rhythm changes.         The  pertinent positives and negatives are noted as above and in the HPI. All other ROS were reviewed and were negative.   Vital Signs:  Patient profile:   60 year old male Height:      70 inches Weight:      183.4 pounds BMI:     26.41 Pulse rate:   76 / minute Pulse rhythm:   regular BP sitting:   108 / 72  (left arm) Cuff size:   regular  Vitals Entered By: Harlow Mares CMA Duncan Dull) (August 31, 2010 10:41 AM)  Physical Exam  General:  Well developed, well nourished, no acute distress. Head:  Normocephalic and atraumatic. Eyes:  PERRLA, no icterus. Ears:  Normal auditory acuity. Mouth:  No deformity or lesions, dentition normal. Neck:  Supple; no masses or thyromegaly. Chest Wall:  Symmetrical,  no deformities . Lungs:  Clear throughout to auscultation. Heart:  Regular rate and rhythm; no murmurs, rubs,  or bruits. ICD Abdomen:  Soft, nontender and nondistended. No masses, hepatosplenomegaly or hernias noted. Normal bowel sounds. Rectal:  deferred until time of colonoscopy.   Msk:  Symmetrical with no gross deformities. Normal posture. Pulses:  Normal pulses noted. Extremities:  No clubbing, cyanosis, edema or deformities noted. Neurologic:  Alert and  oriented x4;  grossly normal neurologically. Cervical Nodes:  No significant cervical adenopathy. Inguinal Nodes:  No significant inguinal adenopathy. Psych:  Alert and cooperative. Normal mood and affect.  Impression & Recommendations:  Problem # 1:  HEMOCCULT POSITIVE STOOL (ICD-578.1)  Occult blood in stool and small volume hematochezia. Rule out colorectal neoplasms hemorrhoids and other disorders. The risks, benefits and alternatives to colonoscopy with possible biopsy and possible polypectomy were discussed with the patient and they consent to proceed. The procedure will be scheduled electively.  He understands that if electrocautery is needed his implantable defibrillator will need to be temporarily  deactivated. Orders: Colonoscopy (Colon)  Problem # 2:  AUTOMATIC IMPLANTABLE CARDIAC DEFIBRILLATOR SITU (ICD-V45.02)  Patient Instructions: 1)  Pick up your prep from your pharmacy.  2)  Colonoscopy brochure given.  3)  Copy sent to : Kerby Nora, MD 4)  The medication list was reviewed and reconciled.  All changed / newly prescribed medications were explained.  A complete medication list was provided to the patient / caregiver.  Prescriptions: MOVIPREP 100 GM  SOLR (PEG-KCL-NACL-NASULF-NA ASC-C) As per prep instructions.  #1 x 0   Entered by:   Christie Nottingham CMA (AAMA)   Authorized by:   Meryl Dare MD [facility]   Signed by:   Christie Nottingham CMA (AAMA) on 08/31/2010   Method used:   [facility]        [facility]  [address] (retail[facility]       [address]       [place], [place]  [address]       Ph: [phone] or [phone]       Fax: [phone]   RxID:   [id number]

## 2010-09-14 LAB — SURGICAL PCR SCREEN
MRSA, PCR: NEGATIVE
Staphylococcus aureus: NEGATIVE

## 2010-09-15 NOTE — Progress Notes (Addendum)
Summary: prior auth needed for adderal  Phone Note From Pharmacy   [facility]  [id number] Summary of Call: Prior Berkley Harvey is needed for adderall, form is on your desk.             Lowella Petties CMA, AAMA  September 08, 2010 10:23 AM      Appended Document: prior auth needed for adderal Prior auth given for adderal, form placed on doctor's desk for signature and scanning.

## 2010-09-21 ENCOUNTER — Telehealth: Payer: Self-pay | Admitting: Family Medicine

## 2010-10-01 NOTE — Progress Notes (Signed)
Summary: SINGULAR  Phone Note Refill Request Message from:  [employer] on September 21, 2010 7:25 AM  Refills Requested: Medication #1:  SINGULAR 10MG    Supply Requested: 1 month   Notes: NOT ON PATIENTS MEDICATION LIST [facility]   Method Requested: Electronic Initial call taken by: Benny Lennert CMA (AAMA),  September 21, 2010 7:26 AM    New/Updated Medications: SINGULAIR 10 MG TABS (MONTELUKAST SODIUM) Take 1 tablet by mouth once a day Prescriptions: SINGULAIR 10 MG TABS (MONTELUKAST SODIUM) Take 1 tablet by mouth once a day  #30 x 5   Entered and Authorized by:   Kerby Nora MD   Signed by:   Kerby Nora MD on 09/21/2010   Method used:   [facility]        [facility]  [address] (retail)       [address]       [place], [place]  [address]       Ph: [phone] or [phone]       Fax: [phone]   RxID:   [id number]

## 2010-10-04 DIAGNOSIS — D126 Benign neoplasm of colon, unspecified: Secondary | ICD-10-CM

## 2010-10-04 HISTORY — DX: Benign neoplasm of colon, unspecified: D12.6

## 2010-10-05 ENCOUNTER — Encounter: Payer: Self-pay | Admitting: Gastroenterology

## 2010-10-06 ENCOUNTER — Encounter: Payer: Self-pay | Admitting: Gastroenterology

## 2010-10-06 ENCOUNTER — Other Ambulatory Visit: Payer: Medicare Other | Admitting: Gastroenterology

## 2010-10-06 ENCOUNTER — Ambulatory Visit (AMBULATORY_SURGERY_CENTER): Payer: Medicare Other | Admitting: Gastroenterology

## 2010-10-06 VITALS — BP 145/81 | HR 75 | Temp 95.8°F | Resp 18 | Ht 70.0 in | Wt 175.0 lb

## 2010-10-06 DIAGNOSIS — K921 Melena: Secondary | ICD-10-CM

## 2010-10-06 DIAGNOSIS — K5730 Diverticulosis of large intestine without perforation or abscess without bleeding: Secondary | ICD-10-CM

## 2010-10-06 DIAGNOSIS — R195 Other fecal abnormalities: Secondary | ICD-10-CM

## 2010-10-06 DIAGNOSIS — K912 Postsurgical malabsorption, not elsewhere classified: Secondary | ICD-10-CM

## 2010-10-06 DIAGNOSIS — D126 Benign neoplasm of colon, unspecified: Secondary | ICD-10-CM

## 2010-10-06 NOTE — Patient Instructions (Signed)
Reviewed discharge instructions  Education material given on polyps, diverticulosis, high fiber diet, and hemorrhoids given  Await  Pathology results via letter in approximately 2 weeks  Follow high fiber diet with liberal fluid intake

## 2010-10-07 ENCOUNTER — Telehealth: Payer: Self-pay | Admitting: *Deleted

## 2010-10-07 NOTE — Telephone Encounter (Signed)

## 2010-10-13 ENCOUNTER — Encounter: Payer: Self-pay | Admitting: Gastroenterology

## 2010-10-15 ENCOUNTER — Other Ambulatory Visit: Payer: Self-pay | Admitting: *Deleted

## 2010-10-15 MED ORDER — AMPHETAMINE-DEXTROAMPHETAMINE 10 MG PO TABS: 10.0000 mg | ORAL_TABLET | Freq: Every day | ORAL | Status: DC

## 2010-10-15 NOTE — Telephone Encounter (Signed)
Prescription placed up front, per Heather.  Pt advised.

## 2010-10-21 ENCOUNTER — Telehealth: Payer: Self-pay | Admitting: *Deleted

## 2010-10-21 NOTE — Telephone Encounter (Signed)
Pt was given a script for adderal last week- picked it up on 4/12, but it was written for one a day, number 30, and the pt takes this twice a day.  He needs a new script written, he will run out in about 2 weeks.  Please call when ready.

## 2010-10-22 NOTE — Telephone Encounter (Signed)
I will write rx upon my return to office on Friday.

## 2010-10-23 MED ORDER — AMPHETAMINE-DEXTROAMPHETAMINE 10 MG PO TABS: 10.0000 mg | ORAL_TABLET | Freq: Every day | ORAL | Status: DC

## 2010-10-23 NOTE — Telephone Encounter (Signed)
Patient advised.

## 2010-11-02 ENCOUNTER — Other Ambulatory Visit: Payer: Self-pay | Admitting: Family Medicine

## 2010-11-02 ENCOUNTER — Ambulatory Visit: Payer: Self-pay

## 2010-11-02 ENCOUNTER — Ambulatory Visit
Admission: RE | Admit: 2010-11-02 | Discharge: 2010-11-02 | Disposition: A | Discharge status: Home / Self Care | Payer: Medicare Other | Source: Ambulatory Visit | Attending: Family Medicine | Admitting: Family Medicine

## 2010-11-02 ENCOUNTER — Ambulatory Visit (INDEPENDENT_AMBULATORY_CARE_PROVIDER_SITE_OTHER): Payer: Medicare Other | Admitting: Family Medicine

## 2010-11-02 ENCOUNTER — Ambulatory Visit (INDEPENDENT_AMBULATORY_CARE_PROVIDER_SITE_OTHER)
Admission: RE | Admit: 2010-11-02 | Discharge: 2010-11-02 | Disposition: A | Discharge status: Home / Self Care | Payer: Medicare Other | Source: Ambulatory Visit | Attending: Family Medicine | Admitting: Family Medicine

## 2010-11-02 DIAGNOSIS — R091 Pleurisy: Secondary | ICD-10-CM

## 2010-11-02 DIAGNOSIS — T148 Other injury of unspecified body region: Secondary | ICD-10-CM

## 2010-11-02 DIAGNOSIS — W57XXXA Bitten or stung by nonvenomous insect and other nonvenomous arthropods, initial encounter: Secondary | ICD-10-CM

## 2010-11-02 NOTE — Progress Notes (Signed)
Addended byMills Koller on: 11/02/2010 04:17 PM   Modules accepted: Orders

## 2010-11-02 NOTE — Assessment & Plan Note (Signed)
Deteriorated. Discussed causes and treatment with pt and advised NSAID but pt prefers to not take anything at this time. CXR for further evaluation, physical exam unremarkable however deep inspirations were painful and limited exam.

## 2010-11-02 NOTE — Progress Notes (Signed)
60 year old male pt of Dr. 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  Has a defibrillator.  Here for two issues: 1.  Bug bite on back of his neck- wife, [name], noticed it last night.  It was a little itchy and irritated.  Started taking some Benadryl and feels fine now. Did not see or feel anything bite him but was around a swarm of bees yesterday. No h/o bee allergies. No wheezing, angioedema, fevers, myalgias or rashes.  2.  Pain with deep inspiration- comes and goes, always feels in the area of left lower anterior chest wall. Has had it intermittently for months, usually goes away.  Started again this weekend. No cough, wheezing or SOB.   Can only feel it with deep inspirations. No fevers.   The PMH, PSH, Social History, Family History, Medications, and allergies have been reviewed in [facility], and have been updated if relevant. ROS: See HPI Patient reports no  vision/ hearing changes,anorexia, weight change, fever ,adenopathy, persistant / recurrent hoarseness, swallowing issues, chest pain,palpitations, edema,persistant / recurrent cough, hemoptysis, dyspnea(rest, exertional, paroxysmal nocturnal), gastrointestinal  bleeding (melena, rectal bleeding), abdominal pain, excessive heart burn, GU symptoms( dysuria, hematuria, pyuria, voiding/incontinence  Issues) syncope, focal weakness, memory loss,numbness & tingling, skin/hair/nail changes,depression, anxiety, abnormal bruising/bleeding, musculoskeletal symptoms/signs.   Physical Exam BP 130/90  Pulse 80  Temp 98.2 F (36.8 C)  Wt 181 lb 6.4 oz (82.283 kg) General:  Well-developed,well-nourished,in no acute distress; alert,appropriate and cooperative throughout examination Nose:  External nasal examination shows no deformity or inflammation. Nasal mucosa are pink and moist without lesions or exudates. Mouth:  Oral mucosa and oropharynx without lesions or exudates.  Teeth in good  repair. MMM Neck:  no carotid bruit or thyromegaly  no cervical or supraclavicular lymphadenopathy  Lungs:  Normal respiratory effort, chest expands symmetrically. Lungs are clear to auscultation, no crackles or wheezes. Heart:  Normal rate and regular rhythm. S1 and S2 normal without gallop, murmur, click, rub or other extra sounds. Abdomen:  Bowel sounds positive,abdomen soft and non-tender without masses, organomegaly or hernias noted. Msk:  No deformity or scoliosis noted of thoracic or lumbar spine.   Pulses:  R and L posterior tibial pulses are full and equal bilaterally  Extremities:  no edmea  Skin:  Small 1 cm raised erythematous papule on back of right neck.  No surrounding erythema or warmth, no drainage. Psych:  Cognition and judgment appear intact. Alert and cooperative with normal attention span and concentration. No apparent delusions, illusions, hallucinations

## 2010-11-02 NOTE — Progress Notes (Signed)
Addended byMills Koller on: 11/02/2010 03:51 PM   Modules accepted: Orders

## 2010-11-02 NOTE — Assessment & Plan Note (Signed)
New.  Reassurance provided, does not appear infected at this time. No symptoms of tick borne illness. Continue benadryl if needed.

## 2010-11-12 ENCOUNTER — Encounter: Payer: Self-pay | Admitting: *Deleted

## 2010-11-17 NOTE — Progress Notes (Signed)
[facility]                  [facility] OFFICE NOTE   NAME:Winrow, SERAPHIM TROW                     MRN:          [id number]  DATE:01/15/2008                            DOB:          April 03, 1951    Mr. Hermann was seen in followup for aborted cardiac arrest (ACA)  occurring in the context of propafenone therapy for PVCs.  He continues  to do well with his mild hypoxic-ischemic encephalopathy.  He has no  complaints of chest pain or shortness of breath.   MEDICATIONS:  1. Diltiazem 300 mg.  2. Zetia 10 mg.  3. Niaspan 500 mg.  4. Crestor.  5. Adderall.   PHYSICAL EXAMINATION:  VITAL SIGNS:  His blood pressure is 110/72 with a  pulse of 70.  LUNGS:  Clear.  HEART:  Sounds were regular.  EXTREMITIES:  Without edema.   Interrogation of his Guidant ICD demonstrates an R-wave of 10 with  impedance of 725, threshold of 1.8 at 0.8, high voltage impedance was 51  ohms, and battery voltage 2.61 representing status MOL2.   IMPRESSION:  1. Aborted cardiac arrest.  2. Status post implantable cardioverter-defibrillator for the aborted      cardiac arrest.   Mr. Kingston is stable.  We will plan to see him again in 3 months' time.     Duke Salvia, MD, [facility]  Electronically Signed    SCK/MedQ  DD: 01/15/2008  DT: 01/16/2008  Job #: [id number]   cc:   Madaline Savage, M.D.

## 2010-11-17 NOTE — Assessment & Plan Note (Signed)
[facility]                         ELECTROPHYSIOLOGY OFFICE NOTE   NAME:Larry [name], Larry [name]                     MRN:          [id number]  DATE:03/21/2007                            DOB:          1950-07-22    SUBJECTIVE:  Larry [name] is seen today in followup for ICD implant for  aborted cardiac arrest.  He continues to do pretty well.  His wife has  concern about his lack of activity.   MEDICATIONS:  1. Diltiazem 300.  2. Zetia.  3. Niaspan.   PHYSICAL EXAMINATION:  VITAL SIGNS:  Blood pressure today was 109/79  with pulse of 74.  LUNGS:  Clear.  HEART:  Sounds were regular.  GENERAL APPEARANCE:  I was impressed with the difficulty he had getting  up from the chair and sitting on the bench.  EXTREMITIES:  Without edema.   Interrogation of his Guidant Prism ICD demonstrates an R wave of 9.5  with impedance of 736, threshold of 2 volts at 0.8.  Battery voltage is  2.72.   IMPRESSION:  1. Aborted cardiac arrest.  2. Status post ICD for the above.  3. Paroxysmal atrial fibrillation.   DISCUSSION:  Larry [name] is doing alright.  I have encouraged him to try  to figure out ways to exercise and talked about potentially putting his  computer on a keyboard in front of a treadmill.   We will see him again in one year's time and he will follow up remotely  in the interim.     Duke Salvia, MD, [facility]  Electronically Signed    SCK/MedQ  DD: 03/21/2007  DT: 03/22/2007  Job #: [id number]   cc:   Madaline Savage, M.D.  [facility]

## 2010-11-17 NOTE — Op Note (Signed)
NAMEOLANREWAJU, OSBORN              ACCOUNT NO.:  [id number]   MEDICAL RECORD NO.:  [id number]          PATIENT TYPE:  AMB   LOCATION:  SDS                          FACILITY:  [facility]   PHYSICIAN:  Velora Heckler, MD      DATE OF BIRTH:  [birth year]   DATE OF PROCEDURE:  01/29/2008  DATE OF DISCHARGE:  01/29/2008                               OPERATIVE REPORT   PREOPERATIVE DIAGNOSIS:  Anal polyp, internal hemorrhoids.   POSTOPERATIVE DIAGNOSIS:  Anal polyp, internal hemorrhoids.   PROCEDURE:  1. Examination under anesthesia.  2. Anoscopy.  3. Excision anal polyp (2)   SURGEON:  Velora Heckler, MD, FACS   ANESTHESIA:  General per Dr. Arta Bruce.   ESTIMATED BLOOD LOSS:  Minimal.   PREPARATION:  Betadine.   COMPLICATIONS:  None.   INDICATIONS:  The patient is a 60 year old white male from [place],  [place], with a history of internal hemorrhoids and an anal polyp  gradually increasing in size.  The patient has a significant cardiac  history.  He now comes to Surgery for exam under anesthesia and excision  of anal polyps.   BODY OF REPORT:  Procedure was done in OR #17 at the [facility].  The patient was brought to the operating room and  placed in supine position on the operating room table.  Following  administration of general anesthesia, the patient was positioned in  lithotomy and prepped and draped in the usual strict aseptic fashion.  After ascertaining that an adequate level of anesthesia had been  achieved, a digital rectal exam was performed.  No palpable masses.  Two  moderate-sized anal polyps were visible on the left side of the anal  orifice.  Anoscopy was performed with a large speculum.  There were  grade 2 internal hemorrhoids.  Lower rectal mucosa appears normal.  No  other gross abnormality was identified.   Using the electrocautery, both anal polyps on the left side of the anal  verge were excised.  These were both  submitted to pathology.  The  mucosal defect was closed with interrupted 3-0 chromic gut sutures.  Local anesthetic  with epinephrine was injected.  Dry gauze was placed as a dressing.  The  patient was taken out of lithotomy and awakened from anesthesia.  He was  brought to the recovery room in stable condition.  The patient tolerated  the procedure well.      Velora Heckler, MD  Electronically Signed     TMG/MEDQ  D:  01/29/2008  T:  01/30/2008  Job:  [id number]   cc:   Madaline Savage, M.D.  Kerby Nora, MD  [facility]

## 2010-11-20 NOTE — Assessment & Plan Note (Signed)
[facility]                                 ON-CALL NOTE   NAME:PAISLEYLeonard, Feigel                     MRN:          [id number]  DATE:08/17/2006                            DOB:          1950-08-04    TIME RECEIVED:  5:37pm.   PATIENT OF:  Dr. Ermalene Searing.   CALLER:  Lupita Leash at [facility].   TELEPHONE NUMBER:  [phone]   The patient is presenting a prescription for Tamiflu 75 mg to take once-  a-day for 5 days. He was evaluated in the office today and told he had  influenza. The pharmacist would like to know should this actually be  b.i.d. dosing. My answer is to change this to b.i.d. dosing for 5 days.     Tera Mater. Clent Ridges, MD  Electronically Signed    SAF/MedQ  DD: 08/17/2006  DT: 08/18/2006  Job #: [id number]

## 2010-11-20 NOTE — Assessment & Plan Note (Signed)
[facility]                         ELECTROPHYSIOLOGY OFFICE NOTE   NAME:PAISLEYKenston, Longton                     MRN:          [id number]  DATE:10/03/2006                            DOB:          December 26, 1950    Mr. Clardy comes in.  He complains of recent onset of lightheadedness  while lifting weights.  He is not having problems while doing the  treadmill but after he lifts weights, which he is doing reps of 10, he  gets lightheaded during the exertion and then significantly lightheaded  afterwards.   Medications are reviewed and are notable for the intercurrent addition  of Crestor.   On examination, his blood pressure is 127/92, his pulse was 100.  LUNGS:  Clear.  HEART SOUNDS:  Regular.  EXTREMITIES:  Without edema.   Interrogation of his Guidant device demonstrates an R-wave of 9.9 with  an impedance of 736 with a threshold of 2 volts of 0.4.  There is no  intercurrent episodes.   IMPRESSION:  1. Aborted sudden cardiac death.  2. Nonischemic cardiomyopathy, possibly related to PVCs.  3. Status post implantable cardioverter-defibrillator for the above.  4. New onset of exertional lightheadedness.   I am concerned about the exertional lightheadedness.  I suspect it is  related to Valsalva with increasing thoracic pressure.  However, its  occurrence during exercise raises the possibility of myocardial issues  and I will talk with Dr. Elsie Lincoln about undertaking stress testing to re-  evaluate this.  I do not know whether he has had recent stress testing  or echo over at Dr. Truett Perna office.     Duke Salvia, MD, [facility]  Electronically Signed    SCK/MedQ  DD: 10/03/2006  DT: 10/03/2006  Job #: [id number]

## 2010-11-20 NOTE — Op Note (Signed)
NAME:  Larry [name], Larry [name]                        ACCOUNT NO.:  [id number]   MEDICAL RECORD NO.:  [id number]                   PATIENT TYPE:  OIB   LOCATION:  2852                                 FACILITY:  [facility]   PHYSICIAN:  Duke Salvia, M.D.               DATE OF BIRTH:  January 08, 1951   DATE OF PROCEDURE:  10/30/2002  DATE OF DISCHARGE:  10/30/2002                                 OPERATIVE REPORT   PREOPERATIVE DIAGNOSIS:  Aborted sudden cardiac death, status post prior  Implantable cardioverter-defibrillator implantation, now at end of life.   POSTOPERATIVE DIAGNOSIS:  Aborted sudden cardiac death, status post prior  Implantable cardioverter-defibrillator implant ion, now at end of life.   OPERATION:  Implantable cardioverter-defibrillator explantation with  generator implantation and intraoperative defibrillation threshold testing.   SURGEON:  Duke Salvia, M.D.   DESCRIPTION OF PROCEDURE:  Following the obtainment of informed consent, the  patient was brought to the Electrophysiology Laboratory and placed on the  fluoroscopic table in the supine position.  After routine prep and drape of  the left upper chest, Lidocaine was infiltrated along the line of the  previous incision and carried down to the layer of the defibrillator pocket  using sharp dissection.  The pocket was opened and the wires were freed up  and the previously implanted device was explanted.   Interrogation of the lead demonstrated a R wave of 18 mV and a pacing  impedance of 621, pacing threshold was 2.2 volts at 0.5 msec and 1.6 volts  at 0.8 msec.  Current at 5 volts was 2.7 mA.   The proximal coil impedance was 6.15 ohms, the distal coil impedance was 685  ohms.   With this acceptable parameters recorded, the leads were then attached to a  [place] Prism 1860 single chamber defibrillator, serial number [id number].  The  device bipolar R wave was 8.5 mV with a pacing impedance of 648 ohms and a  pacing  threshold of 1.6 volts at 0.8 msec.  High voltage impedance was 45  ohms.   With this acceptable parameters recorded, defibrillation threshold testing  was undertaken.  Ventricular fibrillation was induced with a  T wave shock.  After a total duration of 8.5 seconds an 11 joule shock was delivered  through a measured resistance of 48 ohms, terminated ventricular  fibrillation and restoring sinus rhythm.  This was accompanied at LEAST  SENSITIVITY and with only a single drop out.  With this acceptable  parameters recorded, the system was implanted.  The pocket was copiously  irrigated with antibiotic containing saline solution.  The leads  and the pulse generator were placed back in the pocket.  The wound was then  closed with a two layer closure and a superficial Dermabond adhesive.  The  wound was then dressed.  The patient tolerated the procedure well without  apparent complications.  Duke Salvia, M.D.    SCK/MEDQ  D:  10/30/2002  T:  10/31/2002  Job:  [id number]   cc:   Madaline Savage, M.D.  [address], [address]  [place]  [place] [address]  Fax: [phone]   Electrophysiology Laboratory   [facility]

## 2010-12-10 ENCOUNTER — Other Ambulatory Visit: Payer: Self-pay | Admitting: *Deleted

## 2010-12-10 NOTE — Telephone Encounter (Signed)
Please call pt when ready.

## 2010-12-11 ENCOUNTER — Other Ambulatory Visit: Payer: Self-pay | Admitting: Cardiovascular Disease

## 2010-12-11 MED ORDER — AMPHETAMINE-DEXTROAMPHETAMINE 10 MG PO TABS: 10.0000 mg | ORAL_TABLET | Freq: Two times a day (BID) | ORAL | Status: DC

## 2010-12-11 NOTE — Telephone Encounter (Signed)
Patient advised.

## 2010-12-17 ENCOUNTER — Encounter: Payer: Self-pay | Admitting: Internal Medicine

## 2010-12-24 ENCOUNTER — Encounter: Payer: Self-pay | Admitting: Cardiovascular Disease

## 2010-12-29 ENCOUNTER — Encounter: Payer: Self-pay | Admitting: Internal Medicine

## 2010-12-29 ENCOUNTER — Ambulatory Visit (INDEPENDENT_AMBULATORY_CARE_PROVIDER_SITE_OTHER): Payer: Medicare Other | Admitting: Internal Medicine

## 2010-12-29 DIAGNOSIS — I4949 Other premature depolarization: Secondary | ICD-10-CM

## 2010-12-29 DIAGNOSIS — Z95810 Presence of automatic (implantable) cardiac defibrillator: Secondary | ICD-10-CM

## 2010-12-29 DIAGNOSIS — I493 Ventricular premature depolarization: Secondary | ICD-10-CM

## 2010-12-29 DIAGNOSIS — I469 Cardiac arrest, cause unspecified: Secondary | ICD-10-CM

## 2010-12-29 DIAGNOSIS — I10 Essential (primary) hypertension: Secondary | ICD-10-CM

## 2010-12-29 MED ORDER — METOPROLOL TARTRATE 25 MG PO TABS: 25.0000 mg | ORAL_TABLET | ORAL | Status: DC

## 2010-12-29 NOTE — Patient Instructions (Addendum)
Please follow up with Gunnar Fusi in pacer clinic in 3 months:  Your physician recommends that you schedule a follow-up appointment in: 1 year

## 2010-12-29 NOTE — Assessment & Plan Note (Signed)
Non sustained ventricular tachycardia on monitor;  Will increase beta blocker

## 2010-12-29 NOTE — Assessment & Plan Note (Signed)
The patient's device was interrogated.  The information was reviewed. No changes were made in the programming.    

## 2010-12-29 NOTE — Progress Notes (Signed)
HPI  Larry [name] is a 60 y.o. male  Seen in followup for aborted cardiac arrest that occurred in the context of PVCs for which he was being treated with Propafenone.  He underwent ICD generator replacement in Dec 2011  The patient denies chest pain, shortness of breath, nocturnal dyspnea, orthopnea or peripheral edema.  There have been no palpitations, lightheadedness or syncope.   Past Medical History  Diagnosis Date  . Hyperlipidemia   . Depression   . Arrhythmia   . Melanoma in situ of skin of trunk     chest  . Headache   . Low back pain syndrome   . Thyroid disease   . ADD (attention deficit disorder)   . Dementia     post resusitative  . ICD (implantable cardiac defibrillator) in place   . Allergy   . Cancer   . Myocardial infarction     sudden cardiac death per pt, ICD placed  . Idiopathic urticaria   . Sleep apnea   . Hypothyroidism     Past Surgical History  Procedure Date  . Hemorrhoid surgery   .  implantation x2   . Icd   . Cardiac defibrillator removal 2004    Defibrillator change [name])  . Tonsillectomy   . Cardiac defibrillator placement 07/01/2010    Explanation of a previously implanted device, pocket revision, and insertion of a new device, and intraoperative defibrillation threshold testing [name])    Current Outpatient Prescriptions  Medication Sig Dispense Refill  . amphetamine-dextroamphetamine (ADDERALL) 10 MG tablet Take 1 tablet (10 mg total) by mouth 2 (two) times daily.  60 tablet  0  . Ascorbic Acid (VITAMIN C) 500 MG tablet Take 500 mg by mouth daily.        Marland Kitchen aspirin 325 MG tablet Take 325 mg by mouth daily.        . calcium carbonate (OS-CAL) 600 MG TABS Take 600 mg by mouth as needed.        . Calcium Carbonate-Vit D-Min 600-400 MG-UNIT TABS Take by mouth.        . CRESTOR 10 MG tablet TAKE 1 TABLET BY MOUTH AT BEDTIME  30 tablet  0  . diltiazem (TIAZAC) 300 MG 24 hr capsule Take 300 mg by mouth daily.        Marland Kitchen donepezil  (ARICEPT) 5 MG tablet Take 5 mg by mouth at bedtime as needed.        . fexofenadine (ALLEGRA) 180 MG tablet Take 180 mg by mouth daily.        . Ginkgo Biloba 40 MG TABS Take by mouth 2 (two) times daily.        . metoprolol tartrate (LOPRESSOR) 25 MG tablet Take 25 mg by mouth 1 dose over 46 hours.        . Multiple Vitamin (MULTIVITAMIN) tablet Take 1 tablet by mouth daily.        . Omega-3 Fatty Acids (FISH OIL) 1000 MG CAPS Take by mouth 1 dose over 46 hours.        . vitamin E 400 UNIT capsule Take 400 Units by mouth daily.          Allergies  Allergen Reactions  . Penicillins     REACTION: unspecified    Review of Systems negative except from HPI and PMH  Physical Exam Well developed and well nourished in no acute distress HENT normal E scleral and icterus clear Neck Supple JVP flat; carotids brisk and full  Device pocket well healed Clear to ausculation Regular rate and rhythm, no murmurs gallops or rub Soft with active bowel sounds No clubbing cyanosis and edema Alert and oriented, grossly normal motor and sensory function Skin Warm and Dry  Assessment and  Plan

## 2010-12-29 NOTE — Assessment & Plan Note (Signed)
Will increase lopresosr from daily to BID  revieewed sideeffects

## 2010-12-29 NOTE — Assessment & Plan Note (Signed)
With recurrent arrhythmia

## 2010-12-30 ENCOUNTER — Other Ambulatory Visit: Payer: Self-pay | Admitting: *Deleted

## 2010-12-30 ENCOUNTER — Encounter: Payer: Self-pay | Admitting: Cardiovascular Disease

## 2010-12-31 ENCOUNTER — Other Ambulatory Visit: Payer: Self-pay | Admitting: *Deleted

## 2011-01-01 ENCOUNTER — Other Ambulatory Visit: Payer: Self-pay | Admitting: Cardiovascular Disease

## 2011-01-01 LAB — ICD DEVICE OBSERVATION
BRDY-0002RV: 50 {beats}/min
DEV-0020ICD: NEGATIVE
DEVICE MODEL ICD: 277857
HV IMPEDENCE: 64 Ohm
RV LEAD AMPLITUDE: 13.2 mv
RV LEAD IMPEDENCE ICD: 783 Ohm
RV LEAD THRESHOLD: 1.1 V
TZON-0003FASTVT: 324.3 ms

## 2011-01-01 MED ORDER — AMPHETAMINE-DEXTROAMPHETAMINE 10 MG PO TABS: 10.0000 mg | ORAL_TABLET | Freq: Two times a day (BID) | ORAL | Status: DC

## 2011-01-01 NOTE — Telephone Encounter (Signed)
Patient advised rx ready for pick  Up

## 2011-01-03 DIAGNOSIS — 419620001 Death: Secondary | SNOMED CT

## 2011-01-03 DEATH — deceased

## 2011-02-02 ENCOUNTER — Encounter: Payer: Self-pay | Admitting: Cardiovascular Disease

## 2011-02-09 ENCOUNTER — Ambulatory Visit (INDEPENDENT_AMBULATORY_CARE_PROVIDER_SITE_OTHER): Payer: Medicare Other | Admitting: Cardiovascular Disease

## 2011-02-09 ENCOUNTER — Encounter: Payer: Self-pay | Admitting: Cardiovascular Disease

## 2011-02-09 VITALS — BP 110/80 | HR 76 | Resp 14 | Ht 69.0 in | Wt 182.0 lb

## 2011-02-09 DIAGNOSIS — E785 Hyperlipidemia, unspecified: Secondary | ICD-10-CM

## 2011-02-09 DIAGNOSIS — I10 Essential (primary) hypertension: Secondary | ICD-10-CM

## 2011-02-09 NOTE — Patient Instructions (Signed)
Your physician wants you to follow-up in:  6 months. You will receive a reminder letter in the mail two months in advance. If you don't receive a letter, please call our office to schedule the follow-up appointment.   

## 2011-02-10 ENCOUNTER — Other Ambulatory Visit: Payer: Self-pay | Admitting: Cardiovascular Disease

## 2011-02-10 ENCOUNTER — Other Ambulatory Visit: Payer: Self-pay | Admitting: *Deleted

## 2011-02-10 MED ORDER — AMPHETAMINE-DEXTROAMPHETAMINE 10 MG PO TABS: 10.0000 mg | ORAL_TABLET | Freq: Two times a day (BID) | ORAL | Status: DC

## 2011-02-10 NOTE — Telephone Encounter (Signed)
Will refill while Dr. Ermalene Searing on vacation

## 2011-02-25 ENCOUNTER — Encounter: Payer: Self-pay | Admitting: Cardiovascular Disease

## 2011-02-25 NOTE — Progress Notes (Signed)
HPI: This is a 60 year old gentleman presenting for follow-up evaluation. The patient is followed by Dr. [name] after treatment with an ICD in 1999. 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 He has normal coronaries and preserved LV function.  The patient lost his Mother last week and is grieving her loss. He has no cardiovascular symptoms to report and specifically denies chest pain, dyspnea, or syncope.  Outpatient Encounter Prescriptions as of 02/09/2011  Medication Sig Dispense Refill  . Ascorbic Acid (VITAMIN C) 500 MG tablet Take 500 mg by mouth daily.        Marland Kitchen aspirin 325 MG tablet Take 325 mg by mouth daily.        . Calcium Carbonate-Vit D-Min 600-400 MG-UNIT TABS Take 1 tablet by mouth.       . diltiazem (TIAZAC) 300 MG 24 hr capsule Take 300 mg by mouth daily.        Marland Kitchen donepezil (ARICEPT) 5 MG tablet Take 5 mg by mouth at bedtime as needed.        . fexofenadine (ALLEGRA) 180 MG tablet Take 180 mg by mouth daily.        . Ginkgo Biloba 40 MG TABS Take by mouth 2 (two) times daily.        . metoprolol tartrate (LOPRESSOR) 25 MG tablet Take 25 mg by mouth 2 (two) times daily.        . montelukast (SINGULAIR) 10 MG tablet Take 10 mg by mouth as needed.       . Multiple Vitamin (MULTIVITAMIN) tablet Take 1 tablet by mouth daily.        . NON FORMULARY Focus factor 1 tab po bid       . Omega-3 Fatty Acids (FISH OIL) 1000 MG CAPS Take by mouth 1 dose over 46 hours.        . vitamin E 400 UNIT capsule Take 400 Units by mouth daily.        Marland Kitchen DISCONTD: amphetamine-dextroamphetamine (ADDERALL) 10 MG tablet Take 1 tablet (10 mg total) by mouth 2 (two) times daily.  60 tablet  0  . DISCONTD: CRESTOR 10 MG tablet TAKE 1 TABLET BY MOUTH AT BEDTIME  30 tablet  0  . DISCONTD: metoprolol tartrate (LOPRESSOR) 25 MG tablet Take 1 tablet (25 mg total) by mouth 1 dose over 24 hours.  30 tablet  6  . DISCONTD: calcium carbonate (OS-CAL) 600 MG TABS  Take 600 mg by mouth as needed.          Allergies  Allergen Reactions  . Penicillins     REACTION: unspecified    Past Medical History  Diagnosis Date  . Cardiac arrest     aborted  . Depression   . PVC (premature ventricular contraction)     associated with cardiac arrest  . Melanoma in situ of skin of trunk     chest  . Headache   . Low back pain syndrome   . Thyroid disease   . ADD (attention deficit disorder)   . Dementia     post resusitative  . ICD (implantable cardiac defibrillator) in place   . Allergy   . Cancer   . Idiopathic urticaria   . Sleep apnea   . Hypothyroidism   . Hyperlipidemia   . GERD (gastroesophageal reflux disease)     ROS: Negative except as per HPI  BP 110/80  Pulse 76  Resp 14  Ht 5\' 9"  (1.753  m)  Wt 182 lb (82.555 kg)  BMI 26.88 kg/m2  PHYSICAL EXAM: Pt is alert and oriented, NAD HEENT: normal Neck: JVP - normal, carotids 2+= without bruits Lungs: CTA bilaterally CV: RRR without murmur or gallop Abd: soft, NT, Positive BS, no hepatomegaly Ext: no C/C/E, distal pulses intact and equal Skin: warm/dry no rash  EKG:  NSR with rare pvc, mild QT prolongation with QTc 470 ms  ASSESSMENT AND PLAN:

## 2011-02-25 NOTE — Assessment & Plan Note (Signed)
BP controlled on diltiazem and metoprolol.

## 2011-02-25 NOTE — Assessment & Plan Note (Signed)
Lipids at goal in patient without CAD. Due for follow-up labs next month.

## 2011-03-03 ENCOUNTER — Other Ambulatory Visit: Payer: Self-pay | Admitting: Cardiovascular Disease

## 2011-03-03 NOTE — Telephone Encounter (Signed)
Pharmacy calling regarding metroprolol, pt is saying he takes RX 2x/day; RX says pt is to take RX 1x/day. Please return pharmacy call to advise/discuss.

## 2011-03-04 NOTE — Telephone Encounter (Signed)
Please clarify direction / dosage of medication metoprolol.

## 2011-03-05 NOTE — Telephone Encounter (Signed)
Checked last 2 OV notes and patient is supposed to be taking Metoprolol Tartrate 1 tablet bid per Dr. [name].  Called and verified dose with pharmacy.  Judithe Modest, CMA

## 2011-03-10 ENCOUNTER — Other Ambulatory Visit: Payer: Self-pay | Admitting: *Deleted

## 2011-03-10 NOTE — Telephone Encounter (Signed)
Please call pt when ready.

## 2011-03-11 MED ORDER — AMPHETAMINE-DEXTROAMPHETAMINE 10 MG PO TABS: 10.0000 mg | ORAL_TABLET | Freq: Two times a day (BID) | ORAL | Status: DC

## 2011-03-11 NOTE — Telephone Encounter (Signed)
Pt. advised rx ready for pick up 

## 2011-03-18 ENCOUNTER — Other Ambulatory Visit: Payer: Self-pay | Admitting: *Deleted

## 2011-03-18 MED ORDER — AMPHETAMINE-DEXTROAMPHETAMINE 10 MG PO TABS: 10.0000 mg | ORAL_TABLET | Freq: Two times a day (BID) | ORAL | Status: DC

## 2011-03-18 NOTE — Telephone Encounter (Signed)
Please call pt when ready.

## 2011-03-18 NOTE — Telephone Encounter (Signed)
Spoke with patient and his wife and she remembers him getting rx from here but, he cant seem to find it. [facility] is going [facility] call [facility] and see if they have the rx their waiting for him.

## 2011-03-18 NOTE — Telephone Encounter (Signed)
Pt with dementia issues... Will reprint.

## 2011-03-18 NOTE — Telephone Encounter (Signed)
I believe this was just refilled 9/5 .Marland Kitchenright?

## 2011-03-18 NOTE — Telephone Encounter (Signed)
Spoke to patient and he still cant find the rx it okay to refill

## 2011-03-19 NOTE — Telephone Encounter (Signed)
Patient advised rx ready for pick up 

## 2011-04-02 LAB — CBC
HCT: 45.1
Hemoglobin: 15.1
MCHC: 33.4
MCV: 92.6
Platelets: 359
RBC: 4.87
RDW: 12.5
WBC: 9.7

## 2011-04-02 LAB — BASIC METABOLIC PANEL
BUN: 10
CO2: 28
Calcium: 10.2
Chloride: 102
Creatinine, Ser: 0.91
GFR calc Af Amer: >60
GFR calc non Af Amer: >60
Glucose, Bld: 104 — ABNORMAL HIGH
Potassium: 4.5
Sodium: 138

## 2011-04-02 LAB — URINALYSIS, ROUTINE W REFLEX MICROSCOPIC
Bilirubin Urine: NEGATIVE
Glucose, UA: NEGATIVE
Hgb urine dipstick: NEGATIVE
Ketones, ur: NEGATIVE
Nitrite: NEGATIVE
Protein, ur: NEGATIVE
Specific Gravity, Urine: 1.022
Urobilinogen, UA: 0.2
pH: 7

## 2011-04-02 LAB — DIFFERENTIAL
Basophils Absolute: 0.1
Basophils Relative: 1
Eosinophils Absolute: 0.1
Eosinophils Relative: 1
Lymphocytes Relative: 41
Lymphs Abs: 4
Monocytes Absolute: 0.8
Monocytes Relative: 8
Neutro Abs: 4.8
Neutrophils Relative %: 49

## 2011-04-02 LAB — PROTIME-INR
INR: 1
Prothrombin Time: 13.1

## 2011-04-14 ENCOUNTER — Other Ambulatory Visit: Payer: Self-pay | Admitting: *Deleted

## 2011-04-14 NOTE — Telephone Encounter (Signed)
Please call pt when ready.

## 2011-04-15 MED ORDER — AMPHETAMINE-DEXTROAMPHETAMINE 10 MG PO TABS: 10.0000 mg | ORAL_TABLET | Freq: Two times a day (BID) | ORAL | Status: DC

## 2011-04-15 NOTE — Telephone Encounter (Signed)
Patient advised.

## 2011-04-26 ENCOUNTER — Ambulatory Visit (INDEPENDENT_AMBULATORY_CARE_PROVIDER_SITE_OTHER): Payer: Medicare Other | Admitting: Family Medicine

## 2011-04-26 ENCOUNTER — Encounter: Payer: Self-pay | Admitting: Family Medicine

## 2011-04-26 DIAGNOSIS — R059 Cough, unspecified: Secondary | ICD-10-CM

## 2011-04-26 DIAGNOSIS — R05 Cough: Secondary | ICD-10-CM

## 2011-04-26 MED ORDER — AZITHROMYCIN 250 MG PO TABS: ORAL_TABLET | ORAL | Status: AC

## 2011-04-26 MED ORDER — BENZONATATE 200 MG PO CAPS: 200.0000 mg | ORAL_CAPSULE | Freq: Three times a day (TID) | ORAL | Status: AC | PRN

## 2011-04-26 NOTE — Progress Notes (Signed)
duration of symptoms: started ~4 days ago Rhinorrhea: some Congestion: some facial pain ear pain: some sore throat: yes, sratchy throat Cough: with chest congestion Myalgias: yes other concerns: grandson has been ill. He's some better today than he was last night.  No fevers now, prev with fever.  Voice change noted by patient.    ROS: See HPI.  Otherwise negative.    Meds, vitals, and allergies reviewed.   GEN: nad, alert and oriented HEENT: mucous membranes moist, TM w/o erythema, some cerumen in canal x2, nasal epithelium injected, OP with mild cobblestoning NECK: supple w/o LA CV: rrr. PULM: ctab, no inc wob, dry cough noted EXT: no edema

## 2011-04-26 NOTE — Patient Instructions (Signed)
Start the [employer] today.  Get some rest and drink plenty of fluids.  If you aren't improving, then start the antibiotics.  Take care.

## 2011-04-26 NOTE — Assessment & Plan Note (Signed)
Some improvement today from yesterday.  Use tessalon for cough, supportive tx o/w and hold rx for abx in meantime.  If not improved, start abx.  F/u prn.  He agrees.  Nontoxic.

## 2011-05-07 ENCOUNTER — Encounter: Payer: Self-pay | Admitting: Internal Medicine

## 2011-05-07 ENCOUNTER — Ambulatory Visit (INDEPENDENT_AMBULATORY_CARE_PROVIDER_SITE_OTHER): Payer: Medicare Other | Admitting: *Deleted

## 2011-05-07 DIAGNOSIS — I469 Cardiac arrest, cause unspecified: Secondary | ICD-10-CM

## 2011-05-07 LAB — ICD DEVICE OBSERVATION
BRDY-0002RV: 50 {beats}/min
DEV-0020ICD: NEGATIVE
DEVICE MODEL ICD: 277857
HV IMPEDENCE: 66 Ohm
RV LEAD AMPLITUDE: 13 mv
RV LEAD IMPEDENCE ICD: 790 Ohm
RV LEAD THRESHOLD: 1.1 V
TZON-0003FASTVT: 324.3 ms
VENTRICULAR PACING ICD: 4 pct

## 2011-05-07 NOTE — Progress Notes (Signed)
ICD check 

## 2011-05-26 ENCOUNTER — Other Ambulatory Visit: Payer: Self-pay | Admitting: *Deleted

## 2011-05-26 MED ORDER — AMPHETAMINE-DEXTROAMPHETAMINE 10 MG PO TABS: 10.0000 mg | ORAL_TABLET | Freq: Two times a day (BID) | ORAL | Status: DC

## 2011-05-26 NOTE — Telephone Encounter (Signed)
Patient advised rx ready for pick up 

## 2011-05-26 NOTE — Telephone Encounter (Signed)
Filled in Dr. Daphine Deutscher absence while she is on vacation.

## 2011-05-26 NOTE — Telephone Encounter (Signed)
Patient calling needs refill this morning b/c he is completely out of medication

## 2011-06-05 DIAGNOSIS — 419620001 Death: Secondary | SNOMED CT

## 2011-06-05 DEATH — deceased

## 2011-06-18 ENCOUNTER — Other Ambulatory Visit: Payer: Self-pay | Admitting: Internal Medicine

## 2011-06-18 NOTE — Telephone Encounter (Signed)
Rx requested for Adderral.

## 2011-06-20 NOTE — Telephone Encounter (Signed)
Okay to print and have Dr. Salena Saner sign if he  Is agreeable if pt needs before Thursday.

## 2011-06-21 MED ORDER — AMPHETAMINE-DEXTROAMPHETAMINE 10 MG PO TABS: 10.0000 mg | ORAL_TABLET | Freq: Two times a day (BID) | ORAL | Status: DC

## 2011-06-21 NOTE — Telephone Encounter (Signed)
Printed and signed by dr Patsy Lager

## 2011-07-12 DIAGNOSIS — R259 Unspecified abnormal involuntary movements: Secondary | ICD-10-CM | POA: Diagnosis not present

## 2011-07-12 DIAGNOSIS — IMO0002 Reserved for concepts with insufficient information to code with codable children: Secondary | ICD-10-CM | POA: Diagnosis not present

## 2011-07-12 DIAGNOSIS — G931 Anoxic brain damage, not elsewhere classified: Secondary | ICD-10-CM | POA: Diagnosis not present

## 2011-07-14 ENCOUNTER — Other Ambulatory Visit: Payer: Self-pay | Admitting: *Deleted

## 2011-07-22 ENCOUNTER — Other Ambulatory Visit: Payer: Self-pay | Admitting: *Deleted

## 2011-07-22 NOTE — Telephone Encounter (Signed)
Print and place in inbox.

## 2011-07-23 MED ORDER — AMPHETAMINE-DEXTROAMPHETAMINE 10 MG PO TABS: 10.0000 mg | ORAL_TABLET | Freq: Two times a day (BID) | ORAL | Status: DC

## 2011-08-10 ENCOUNTER — Encounter: Payer: Self-pay | Admitting: Cardiovascular Disease

## 2011-08-10 ENCOUNTER — Ambulatory Visit (INDEPENDENT_AMBULATORY_CARE_PROVIDER_SITE_OTHER): Payer: Medicare Other | Admitting: Cardiovascular Disease

## 2011-08-10 DIAGNOSIS — I10 Essential (primary) hypertension: Secondary | ICD-10-CM | POA: Diagnosis not present

## 2011-08-10 DIAGNOSIS — E785 Hyperlipidemia, unspecified: Secondary | ICD-10-CM | POA: Diagnosis not present

## 2011-08-10 DIAGNOSIS — R0602 Shortness of breath: Secondary | ICD-10-CM | POA: Insufficient documentation

## 2011-08-10 DIAGNOSIS — E7800 Pure hypercholesterolemia, unspecified: Secondary | ICD-10-CM

## 2011-08-10 NOTE — Assessment & Plan Note (Signed)
Pt on statin Rx. He is due for lipids and lft's. Will schedule for bloodwork.

## 2011-08-10 NOTE — Progress Notes (Signed)
HPI: 61 year old gentleman presenting for followup evaluation. The patient sustained an out of hospital cardiac arrest many years ago and underwent ICD implantation in 1999. He has residual cognitive dysfunction, but has been stable from a cardiovascular perspective. The patient has normal coronary arteries by catheterization and also has normal left ventricular systolic function.   He complains of generalized fatigue and shortness of breath with exertion. Has noted shortness of breath with activities such as walking at a brisk pace. No edema, orthopnea, PND, or chest pain. No palpitations or other complaints.  Outpatient Encounter Prescriptions as of 08/10/2011  Medication Sig Dispense Refill  . amphetamine-dextroamphetamine (ADDERALL) 10 MG tablet Take 1 tablet (10 mg total) by mouth 2 (two) times daily.  60 tablet  0  . Ascorbic Acid (VITAMIN C) 500 MG tablet Take 500 mg by mouth daily.        Marland Kitchen aspirin 325 MG tablet Take 81 mg by mouth daily.       . Calcium Carbonate-Vit D-Min 600-400 MG-UNIT TABS Take 1 tablet by mouth.       . CRESTOR 10 MG tablet TAKE 1 TABLET BY MOUTH AT BEDTIME  30 tablet  11  . diltiazem (TIAZAC) 300 MG 24 hr capsule TAKE 1 CAPSULE EVERY DAY  30 capsule  6  . donepezil (ARICEPT) 5 MG tablet Take 5 mg by mouth at bedtime.       Marland Kitchen escitalopram (LEXAPRO) 10 MG tablet Take 10 mg by mouth daily.      . fexofenadine (ALLEGRA) 180 MG tablet Take 180 mg by mouth daily.        . Ginkgo Biloba 40 MG TABS Take by mouth 2 (two) times daily.        . metoprolol tartrate (LOPRESSOR) 25 MG tablet Take 25 mg by mouth 2 (two) times daily.        . montelukast (SINGULAIR) 10 MG tablet Take 10 mg by mouth as needed.       . Multiple Vitamin (MULTIVITAMIN) tablet Take 1 tablet by mouth daily.        . NON FORMULARY Focus factor 1 tab po bid       . Omega-3 Fatty Acids (FISH OIL) 1000 MG CAPS Take by mouth 1 dose over 46 hours.        . vitamin E 400 UNIT capsule Take 400 Units by mouth  daily.          Allergies  Allergen Reactions  . Penicillins     rash    Past Medical History  Diagnosis Date  . Cardiac arrest     aborted  . Depression   . PVC (premature ventricular contraction)     associated with cardiac arrest  . Melanoma in situ of skin of trunk     chest  . Headache   . Low back pain syndrome   . Thyroid disease   . ADD (attention deficit disorder)   . Dementia     post resusitative  . ICD (implantable cardiac defibrillator) in place   . Allergy   . Cancer   . Idiopathic urticaria   . Sleep apnea   . Hypothyroidism   . Hyperlipidemia   . GERD (gastroesophageal reflux disease)     ROS: Negative except as per HPI  BP 128/89  Pulse 80  Ht 5' 9.5" (1.765 m)  Wt 81.103 kg (178 lb 12.8 oz)  BMI 26.03 kg/m2  PHYSICAL EXAM: Pt is alert and oriented, NAD HEENT: normal Neck:  JVP - normal, carotids 2+= without bruits Lungs: CTA bilaterally CV: RRR without murmur or gallop Abd: soft, NT, Positive BS, no hepatomegaly Ext: no C/C/E, distal pulses intact and equal Skin: warm/dry no rash  ASSESSMENT AND PLAN:

## 2011-08-10 NOTE — Assessment & Plan Note (Signed)
BP controlled on current medical therapy. Will continue the same.

## 2011-08-10 NOTE — Assessment & Plan Note (Signed)
Lungs are clear on exam and no associated pulmonary symptoms. Recommend check a 2D echocardiogram to rule out occult cardiac disease.

## 2011-08-10 NOTE — Patient Instructions (Signed)
Your physician has requested that you have an echocardiogram. Echocardiography is a painless test that uses sound waves to create images of your heart. It provides your doctor with information about the size and shape of your heart and how well your heart's chambers and valves are working. This procedure takes approximately one hour. There are no restrictions for this procedure.  Your physician recommends that you return for a FASTING LIPID, LIVER, BMP and CBC--nothing to eat or drink after midnight, lab opens at 8:30  Your physician wants you to follow-up in: 6 MONTHS.  You will receive a reminder letter in the mail two months in advance. If you don't receive a letter, please call our office to schedule the follow-up appointment.  Your physician recommends that you continue on your current medications as directed. Please refer to the Current Medication list given to you today.

## 2011-08-17 ENCOUNTER — Other Ambulatory Visit: Payer: Self-pay

## 2011-08-17 ENCOUNTER — Other Ambulatory Visit (INDEPENDENT_AMBULATORY_CARE_PROVIDER_SITE_OTHER): Payer: Medicare Other | Admitting: *Deleted

## 2011-08-17 ENCOUNTER — Ambulatory Visit (HOSPITAL_COMMUNITY): Payer: Medicare Other | Attending: Cardiovascular Disease | Admitting: Radiology

## 2011-08-17 DIAGNOSIS — E785 Hyperlipidemia, unspecified: Secondary | ICD-10-CM | POA: Insufficient documentation

## 2011-08-17 DIAGNOSIS — R0602 Shortness of breath: Secondary | ICD-10-CM | POA: Diagnosis not present

## 2011-08-17 DIAGNOSIS — I4949 Other premature depolarization: Secondary | ICD-10-CM | POA: Diagnosis not present

## 2011-08-17 DIAGNOSIS — E7800 Pure hypercholesterolemia, unspecified: Secondary | ICD-10-CM | POA: Diagnosis not present

## 2011-08-17 DIAGNOSIS — Z8674 Personal history of sudden cardiac arrest: Secondary | ICD-10-CM | POA: Insufficient documentation

## 2011-08-17 DIAGNOSIS — R0989 Other specified symptoms and signs involving the circulatory and respiratory systems: Secondary | ICD-10-CM | POA: Insufficient documentation

## 2011-08-17 DIAGNOSIS — R0609 Other forms of dyspnea: Secondary | ICD-10-CM | POA: Diagnosis not present

## 2011-08-17 LAB — BASIC METABOLIC PANEL
BUN: 10 mg/dL (ref 6–23)
CO2: 29 mEq/L (ref 19–32)
Calcium: 9.4 mg/dL (ref 8.4–10.5)
Chloride: 104 mEq/L (ref 96–112)
Creatinine, Ser: 1 mg/dL (ref 0.4–1.5)
GFR: 84.17 mL/min (ref >60.00)
Glucose, Bld: 93 mg/dL (ref 70–99)
Potassium: 4 mEq/L (ref 3.5–5.1)
Sodium: 139 mEq/L (ref 135–145)

## 2011-08-17 LAB — HEPATIC FUNCTION PANEL
ALT: 33 U/L (ref 0–53)
AST: 27 U/L (ref 0–37)
Albumin: 4.1 g/dL (ref 3.5–5.2)
Alkaline Phosphatase: 55 U/L (ref 39–117)
Bilirubin, Direct: 0 mg/dL (ref 0.0–0.3)
Total Bilirubin: 0.7 mg/dL (ref 0.3–1.2)
Total Protein: 6.9 g/dL (ref 6.0–8.3)

## 2011-08-17 LAB — CBC WITH DIFFERENTIAL/PLATELET
Basophils Absolute: 0 10*3/uL (ref 0.0–0.1)
Basophils Relative: 0.7 % (ref 0.0–3.0)
Eosinophils Absolute: 0.1 10*3/uL (ref 0.0–0.7)
Eosinophils Relative: 1.4 % (ref 0.0–5.0)
HCT: 42.5 % (ref 39.0–52.0)
Hemoglobin: 14.2 g/dL (ref 13.0–17.0)
Lymphocytes Relative: 47.9 % — ABNORMAL HIGH (ref 12.0–46.0)
Lymphs Abs: 3.1 10*3/uL (ref 0.7–4.0)
MCHC: 33.5 g/dL (ref 30.0–36.0)
MCV: 93 fl (ref 78.0–100.0)
Monocytes Absolute: 0.5 10*3/uL (ref 0.1–1.0)
Monocytes Relative: 8.3 % (ref 3.0–12.0)
Neutro Abs: 2.7 10*3/uL (ref 1.4–7.7)
Neutrophils Relative %: 41.7 % — ABNORMAL LOW (ref 43.0–77.0)
Platelets: 275 10*3/uL (ref 150.0–400.0)
RBC: 4.57 Mil/uL (ref 4.22–5.81)
RDW: 12.3 % (ref 11.5–14.6)
WBC: 6.4 10*3/uL (ref 4.5–10.5)

## 2011-08-17 LAB — LIPID PANEL
Cholesterol: 176 mg/dL (ref 0–200)
HDL: 54.7 mg/dL (ref >39.00)
LDL Cholesterol: 94 mg/dL (ref 0–99)
Total CHOL/HDL Ratio: 3
Triglycerides: 137 mg/dL (ref 0.0–149.0)
VLDL: 27.4 mg/dL (ref 0.0–40.0)

## 2011-08-24 ENCOUNTER — Ambulatory Visit (INDEPENDENT_AMBULATORY_CARE_PROVIDER_SITE_OTHER): Payer: Medicare Other | Admitting: *Deleted

## 2011-08-24 ENCOUNTER — Encounter: Payer: Medicare Other | Admitting: *Deleted

## 2011-08-24 ENCOUNTER — Encounter: Payer: Self-pay | Admitting: Internal Medicine

## 2011-08-24 DIAGNOSIS — I472 Ventricular tachycardia: Secondary | ICD-10-CM | POA: Diagnosis not present

## 2011-08-24 LAB — ICD DEVICE OBSERVATION
BRDY-0002RV: 50 {beats}/min
DEV-0020ICD: NEGATIVE
DEVICE MODEL ICD: 277857
HV IMPEDENCE: 65 Ohm
RV LEAD AMPLITUDE: 11.5 mv
RV LEAD IMPEDENCE ICD: 762 Ohm
RV LEAD THRESHOLD: 1.3 V
TZON-0003FASTVT: 324.3 ms
VENTRICULAR PACING ICD: 4 pct

## 2011-08-25 NOTE — Progress Notes (Signed)
ICD check 

## 2011-08-26 ENCOUNTER — Other Ambulatory Visit: Payer: Self-pay | Admitting: Family Medicine

## 2011-08-26 MED ORDER — AMPHETAMINE-DEXTROAMPHETAMINE 10 MG PO TABS: 10.0000 mg | ORAL_TABLET | Freq: Two times a day (BID) | ORAL | Status: DC

## 2011-08-26 NOTE — Telephone Encounter (Signed)
Triage Record Num: [id number] Operator: Albertine Grates Patient Name: Larry [name] Call Date & Time: 08/26/2011 4:14:12PM Patient Phone: [phone] PCP: Kerby Nora Patient Gender: Male PCP Fax : [phone] Patient DOB: 1950/07/17 Practice Name: [facility] Reason for Call: Caller: [name]/Patient; PCP: Excell Seltzer.; CB#: [phone]; ; ; Call regarding Needs Script Written; Needs written script for Amphetamine Salts 10mg  BID. Has enough to last until "next week". PLEASE CALL PT AND ADVISE WHEN CAN PICK UP SCRIPT. [phone] Protocol(s) Used: Office Note Recommended Outcome per Protocol: Information Noted and Sent to Office Reason for Outcome: Caller information to office Care Advice: ~

## 2011-08-26 NOTE — Telephone Encounter (Signed)
Refill request sent to dr copland

## 2011-08-27 NOTE — Telephone Encounter (Signed)
Patient advised rx ready for pick up 

## 2011-09-06 ENCOUNTER — Other Ambulatory Visit: Payer: Self-pay | Admitting: Cardiovascular Disease

## 2011-09-14 ENCOUNTER — Other Ambulatory Visit: Payer: Self-pay | Admitting: Cardiovascular Disease

## 2011-09-27 ENCOUNTER — Other Ambulatory Visit: Payer: Self-pay | Admitting: *Deleted

## 2011-09-27 ENCOUNTER — Encounter: Payer: Self-pay | Admitting: Family Medicine

## 2011-09-27 NOTE — Telephone Encounter (Signed)
Patient requesting refill on adderall  °

## 2011-09-28 MED ORDER — AMPHETAMINE-DEXTROAMPHETAMINE 10 MG PO TABS: 10.0000 mg | ORAL_TABLET | Freq: Two times a day (BID) | ORAL | Status: DC

## 2011-09-28 NOTE — Telephone Encounter (Signed)
In copland inbox for signature will schedule appt when receive rx back

## 2011-09-28 NOTE — Telephone Encounter (Signed)
Okay to fill.. Print rx and have Cop;land sign please.  Last refill 08/26/11, last OV 2/13  Pt overduse for CPX.Marland Kitchen Have him schedule in MAY/June please.

## 2011-10-04 ENCOUNTER — Telehealth: Payer: Self-pay | Admitting: *Deleted

## 2011-10-04 NOTE — Telephone Encounter (Signed)
Paper work for prior authorization in Audiological scientist for signature.

## 2011-10-04 NOTE — Telephone Encounter (Signed)
done

## 2011-10-05 ENCOUNTER — Other Ambulatory Visit: Payer: Self-pay | Admitting: Family Medicine

## 2011-10-05 ENCOUNTER — Telehealth: Payer: Self-pay | Admitting: Family Medicine

## 2011-10-05 NOTE — Telephone Encounter (Signed)
Patient's PCP is Dr.Bedsole.  Patient asked if he could switch to Dr.Duncan.  He said because Dr.Bedsole is part time, he has a hard time getting his prescriptions refilled.  Patient saw Dr.Duncan last month and said he felt more comfortable with a male doctor.  Can patient switch?

## 2011-10-05 NOTE — Telephone Encounter (Signed)
Pt said [facility] was not getting response to refill request for Adderall. Herbert Seta, Dr Daphine Deutscher nurse said waiting on prior auth and she will call pt when done at [phone]. Pt will wait to hear from [facility].

## 2011-10-05 NOTE — Telephone Encounter (Signed)
No problem. This is a very pleasant gentleman, uncomplicated.

## 2011-10-05 NOTE — Telephone Encounter (Signed)
Okay with me if okay with Dr. Leonard Schwartz.

## 2011-10-05 NOTE — Telephone Encounter (Signed)
Left message for patient and wife advising them that we are a awaiting approval from insurance company and as soon as i receive the approval then i will contact them.

## 2011-10-11 ENCOUNTER — Telehealth: Payer: Self-pay | Admitting: Family Medicine

## 2011-10-11 NOTE — Telephone Encounter (Signed)
Will refax PA today

## 2011-10-11 NOTE — Telephone Encounter (Signed)
Please see about this.  PA was prev done by Dr. Patsy Lager.  Thanks.

## 2011-10-11 NOTE — Telephone Encounter (Signed)
Spoke with Herbert Seta, she had already started PA prior to 10/05/11. Transferring to [employer].

## 2011-10-11 NOTE — Telephone Encounter (Signed)
Caller/ [name]  PCP: Crawford Givens Clelia Croft); CB#: [phone]; Call regarding Medication Question/ PCP Concerns; States original Rx. for amphetamine salts (Adderall is brand name, but he uses generic)  written by Dr. Sandria Manly Neurologist.  He got current  Rx from [place].   [facility]  [phone] has advised that prior authorization is needed to get it filled. He has enough medication to last until 10/12/11.   He also relates that he has been changed from Dr. Ermalene Searing to Dr. Para March as PCP.  OFFICE, PLEASE FOLLOW UP WITH PRIOR AUTHORIZATION/ REAUTHORIZATION OF HIS MEDICATION WITH [facility] ASAP.  THANK YOU.

## 2011-10-20 ENCOUNTER — Ambulatory Visit: Payer: Medicare Other | Admitting: Family Medicine

## 2011-10-21 ENCOUNTER — Ambulatory Visit (INDEPENDENT_AMBULATORY_CARE_PROVIDER_SITE_OTHER): Payer: Medicare Other | Admitting: Family Medicine

## 2011-10-21 ENCOUNTER — Encounter: Payer: Self-pay | Admitting: Family Medicine

## 2011-10-21 VITALS — BP 130/86 | HR 70 | Temp 98.2°F | Wt 178.0 lb

## 2011-10-21 DIAGNOSIS — R197 Diarrhea, unspecified: Secondary | ICD-10-CM | POA: Insufficient documentation

## 2011-10-21 DIAGNOSIS — I861 Scrotal varices: Secondary | ICD-10-CM

## 2011-10-21 NOTE — Patient Instructions (Signed)
Go to the lab on the way out.  We'll contact you with your lab report. Take care.  It's okay to take imodium as needed.

## 2011-10-21 NOTE — Progress Notes (Signed)
Irregular bowel habits since xmas 2012.  He has diarrhea frequently, no constipation.  No blood in stool.  No incontinence.  Urination is at baseline, fine per patient.  No abd pain.  He's on probiotic now but that didn't help.  No trigger foods noted.  Colonoscopy done 10/2011 with small polyp and diverticulosis.  No other sig pathology.  No recent travel or exposures.  Imodium helps some, transient relief.  He may have seen a worm with BM, but this isn't clear.    Prev labs, since sx started, were unremarkable.    On scrotum, he has dark red changes.  Not ttp.  He's had them for a while.  One of them may have bled or oozed a few days ago.    Meds, vitals, and allergies reviewed.   ROS: See HPI.  Otherwise, noncontributory.  nad ncat Mmm, op wnl Neck supple, no la rrr ctab abd soft, not ttp Ext w/o edema Small scrotal varices noted

## 2011-10-21 NOTE — Assessment & Plan Note (Signed)
Small, not ttp, reassured, f/u prn.

## 2011-10-21 NOTE — Assessment & Plan Note (Signed)
Colonoscopy reassuring 2012, no blood in stool.  Check stool studies as other labs recently unremarkable.  He agrees.

## 2011-10-25 ENCOUNTER — Ambulatory Visit (INDEPENDENT_AMBULATORY_CARE_PROVIDER_SITE_OTHER): Payer: Medicare Other | Admitting: Family Medicine

## 2011-10-25 ENCOUNTER — Encounter: Payer: Self-pay | Admitting: Family Medicine

## 2011-10-25 ENCOUNTER — Other Ambulatory Visit: Payer: Self-pay | Admitting: Family Medicine

## 2011-10-25 VITALS — BP 136/98 | HR 84 | Temp 97.7°F | Wt 181.2 lb

## 2011-10-25 DIAGNOSIS — R197 Diarrhea, unspecified: Secondary | ICD-10-CM | POA: Diagnosis not present

## 2011-10-25 DIAGNOSIS — J069 Acute upper respiratory infection, unspecified: Secondary | ICD-10-CM | POA: Diagnosis not present

## 2011-10-25 MED ORDER — GUAIFENESIN-CODEINE 100-10 MG/5ML PO SYRP: 5.0000 mL | ORAL_SOLUTION | Freq: Every evening | ORAL | Status: AC | PRN

## 2011-10-25 MED ORDER — AZITHROMYCIN 250 MG PO TABS: ORAL_TABLET | ORAL | Status: AC

## 2011-10-25 NOTE — Assessment & Plan Note (Addendum)
With maculopapular rash consistent with viral exanthem. Anticipate viral URI. Supportive care as per instructions. Update [place] if not improving.  If worsening, fill WASP for zpack (provided given comorbidities). Wrote out all information.  Cerumen disimpaction performed, hearing change likely due to sinus congestion.

## 2011-10-25 NOTE — Progress Notes (Signed)
  Subjective:    Patient ID: Larry [name], male    DOB: [birth year], 61 y.o.   MRN: [id number]  HPI CC: ST, RN  H/o melanoma in situ s/p excision.  H/o cardiac arrest with post-resuscitative dementia on aricept and adderall.  Saw PCP last week 10/21/2011.  When arrived home, noted hoarseness, coughing, sneezing, rhinorrhea, coryza.  Dry cough.  sxs going on 5 days.  Subjective fever yesterday.  + frontal headache.  + PNdrainage.  Yesterday rash on anterior upper chest noted, extending down torso and anterior arms.  Not itchy or painful.  Seems to get rash whenever he gets sick.  So far has tried oral decongestant, fenofexadine.  No chills, abd pain, ear or tooth pain.  Brings some left over zpack from October 2012 but did not start taking yet.  Tends to get chest cold in fall and spring.  No smokers at home.  Sick grandson last week as well as came into office around sick ppl last week.  No h/o asthma, COPD.  Review of Systems Per HPI    Objective:   Physical Exam  Nursing note and vitals reviewed. Constitutional: He appears well-developed and well-nourished. No distress.  HENT:  Head: Normocephalic and atraumatic.  Right Ear: Tympanic membrane, external ear and ear canal normal. Decreased hearing is noted.  Left Ear: Tympanic membrane, external ear and ear canal normal. Decreased hearing is noted.  Nose: Nose normal. No mucosal edema or rhinorrhea. Right sinus exhibits no maxillary sinus tenderness and no frontal sinus tenderness. Left sinus exhibits no maxillary sinus tenderness and no frontal sinus tenderness.  Mouth/Throat: Oropharynx is clear and moist. No oropharyngeal exudate.       Cerumen plug R>L - removed under direct visualization with plastic ear curette.  No significant change in hearing.  Eyes: Conjunctivae and EOM are normal. Pupils are equal, round, and reactive to light. No scleral icterus.       No conjunctivitis  Neck: Normal range of motion. Neck supple.    Cardiovascular: Normal rate, regular rhythm, normal heart sounds and intact distal pulses.   No murmur heard. Pulmonary/Chest: Effort normal and breath sounds normal. No respiratory distress. He has no wheezes. He has no rales.  Lymphadenopathy:    He has no cervical adenopathy.  Skin: Skin is warm and dry. Rash (diffuse maculopapular erythematous rash on lower neck as well as anterior medial arms/forearms) noted.  Psychiatric: He has a normal mood and affect.       Assessment & Plan:

## 2011-10-25 NOTE — Patient Instructions (Addendum)
You have an upper respiratory infection.  However likely viral. Take medicine as prescribed: cheratussin for cough at night. Push fluids and plenty of rest. Nasal saline irrigation or neti pot to help drain sinuses. May use simple mucinex with plenty of fluid to help mobilize mucous. Let us know if fever >101.5, trouble opening/closing mouth, difficulty swallowing, or worsening productive cough. If not improving at 10 day mark or any sudden worsening, fill antibiotic.

## 2011-10-26 LAB — CLOSTRIDIUM DIFFICILE EIA: CDIFTX: NEGATIVE

## 2011-10-27 LAB — OVA AND PARASITE EXAMINATION: OP: NONE SEEN

## 2011-10-28 ENCOUNTER — Other Ambulatory Visit: Payer: Self-pay | Admitting: Family Medicine

## 2011-10-28 ENCOUNTER — Encounter: Payer: Self-pay | Admitting: Gastroenterology

## 2011-10-28 DIAGNOSIS — R197 Diarrhea, unspecified: Secondary | ICD-10-CM

## 2011-10-29 LAB — STOOL CULTURE

## 2011-11-03 ENCOUNTER — Other Ambulatory Visit: Payer: Medicare Other

## 2011-11-05 ENCOUNTER — Encounter: Payer: Medicare Other | Admitting: Family Medicine

## 2011-11-10 ENCOUNTER — Other Ambulatory Visit: Payer: Self-pay

## 2011-11-10 MED ORDER — AMPHETAMINE-DEXTROAMPHETAMINE 10 MG PO TABS: 10.0000 mg | ORAL_TABLET | Freq: Two times a day (BID) | ORAL | Status: DC

## 2011-11-10 NOTE — Telephone Encounter (Signed)
Pt left v/m requesting written rx Amphetamine-dextroamphetamine 10 mg. Pt last seen 10/25/11. When rx ready for pick up call  [phone]. Pt request to pick up rx today.

## 2011-11-10 NOTE — Telephone Encounter (Signed)
Patient notified and Rx placed up front for pick up. 

## 2011-11-10 NOTE — Telephone Encounter (Signed)
Please give to pt.  Thanks.   

## 2011-11-23 ENCOUNTER — Encounter: Payer: Medicare Other | Admitting: Internal Medicine

## 2011-11-23 ENCOUNTER — Ambulatory Visit: Payer: Medicare Other | Admitting: Gastroenterology

## 2011-11-30 ENCOUNTER — Encounter: Payer: Self-pay | Admitting: Gastroenterology

## 2011-11-30 ENCOUNTER — Other Ambulatory Visit: Payer: Medicare Other

## 2011-11-30 ENCOUNTER — Ambulatory Visit (INDEPENDENT_AMBULATORY_CARE_PROVIDER_SITE_OTHER): Payer: Medicare Other | Admitting: Gastroenterology

## 2011-11-30 VITALS — BP 110/80 | HR 80 | Ht 69.0 in | Wt 177.6 lb

## 2011-11-30 DIAGNOSIS — R197 Diarrhea, unspecified: Secondary | ICD-10-CM

## 2011-11-30 DIAGNOSIS — Z86010 Personal history of colonic polyps: Secondary | ICD-10-CM | POA: Diagnosis not present

## 2011-11-30 MED ORDER — CIPROFLOXACIN HCL 500 MG PO TABS: 500.0000 mg | ORAL_TABLET | Freq: Two times a day (BID) | ORAL | Status: AC

## 2011-11-30 MED ORDER — METRONIDAZOLE 500 MG PO TABS: 500.0000 mg | ORAL_TABLET | Freq: Two times a day (BID) | ORAL | Status: AC

## 2011-11-30 NOTE — Patient Instructions (Signed)
Your physician has requested that you go to the basement for the following lab work before leaving today:C. Diff PCR, Celiac panel. We have sent the following medications to your pharmacy for you to pick up at your convenience:Cipro and Flagyl. You have been given a low fat diet. cc: Crawford Givens, MD

## 2011-11-30 NOTE — Progress Notes (Signed)
History of Present Illness: This is a 61 year old male with diarrhea. His wife is with him today. He relates looser stools occurring once or twice each day for the past 5 months. He frequently notes that greasy or higher fat foods bring on looser stools. He has not noted any other associated dietary stressors. Stool studies recently performed by Dr. Para March were negative. He underwent colonoscopy in April 2012 which showed small adenomatous colon polyp, mild diverticulosis and internal hemorrhoids. Denies weight loss, abdominal pain, constipation, change in stool caliber, melena, hematochezia, nausea, vomiting, dysphagia, reflux symptoms, chest pain.  Current Medications, Allergies, Past Medical History, Past Surgical History, Family History and Social History were reviewed in [employer] record.  Physical Exam: General: Well developed , well nourished, no acute distress Head: Normocephalic and atraumatic Eyes:  sclerae anicteric, EOMI Ears: Normal auditory acuity Mouth: No deformity or lesions Lungs: Clear throughout to auscultation Heart: Regular rate and rhythm; no murmurs, rubs or bruits Abdomen: Soft, non tender and non distended. No masses, hepatosplenomegaly or hernias noted. Normal Bowel sounds Musculoskeletal: Symmetrical with no gross deformities  Pulses:  Normal pulses noted Extremities: No clubbing, cyanosis, edema or deformities noted Neurological: Alert oriented x 4, grossly nonfocal Psychological:  Alert and cooperative. Normal mood and affect  Assessment and Recommendations:  1. Diarrhea. This might be related to high fat food intolerance. Begin a low fat diet. Although the standard C. difficile toxin assay will negative proceed with C. difficile PCR. Obtain a celiac panel. Trial of Cipro and Flagyl. Return office visit in 4 weeks.  2. personal history of adenomatous colon polyps. Surveillance colonoscopy recommended April 2017.

## 2011-12-01 LAB — CLOSTRIDIUM DIFFICILE BY PCR: Toxigenic C. Difficile by PCR: NOT DETECTED

## 2011-12-01 LAB — CELIAC PANEL 10
Endomysial Screen: NEGATIVE
Gliadin IgA: 5.4 U/mL (ref <20)
Gliadin IgG: 15.1 U/mL (ref <20)
IgA: 173 mg/dL (ref 68–379)
Tissue Transglut Ab: 2.9 U/mL (ref <20)
Tissue Transglutaminase Ab, IgA: 2.4 U/mL (ref <20)

## 2011-12-04 ENCOUNTER — Other Ambulatory Visit: Payer: Self-pay | Admitting: Family Medicine

## 2011-12-04 DIAGNOSIS — E7800 Pure hypercholesterolemia, unspecified: Secondary | ICD-10-CM

## 2011-12-04 DIAGNOSIS — E039 Hypothyroidism, unspecified: Secondary | ICD-10-CM

## 2011-12-07 ENCOUNTER — Other Ambulatory Visit: Payer: Self-pay | Admitting: Family Medicine

## 2011-12-07 MED ORDER — AMPHETAMINE-DEXTROAMPHETAMINE 10 MG PO TABS: 10.0000 mg | ORAL_TABLET | Freq: Two times a day (BID) | ORAL | Status: DC

## 2011-12-07 NOTE — Telephone Encounter (Signed)
Please give to patient.  Thanks. 

## 2011-12-07 NOTE — Telephone Encounter (Signed)
Patient advised and Rx left at front desk for pick up. 

## 2011-12-07 NOTE — Telephone Encounter (Signed)
Pt is needing refill on 10 mg of generic for Adderall

## 2011-12-08 ENCOUNTER — Encounter: Payer: Self-pay | Admitting: *Deleted

## 2011-12-08 ENCOUNTER — Other Ambulatory Visit: Payer: Medicare Other

## 2011-12-09 ENCOUNTER — Other Ambulatory Visit (INDEPENDENT_AMBULATORY_CARE_PROVIDER_SITE_OTHER): Payer: Medicare Other

## 2011-12-09 DIAGNOSIS — E039 Hypothyroidism, unspecified: Secondary | ICD-10-CM | POA: Diagnosis not present

## 2011-12-09 DIAGNOSIS — E7800 Pure hypercholesterolemia, unspecified: Secondary | ICD-10-CM

## 2011-12-09 LAB — COMPREHENSIVE METABOLIC PANEL
ALT: 58 U/L — ABNORMAL HIGH (ref 0–53)
AST: 44 U/L — ABNORMAL HIGH (ref 0–37)
Albumin: 4.1 g/dL (ref 3.5–5.2)
Alkaline Phosphatase: 53 U/L (ref 39–117)
BUN: 12 mg/dL (ref 6–23)
CO2: 29 mEq/L (ref 19–32)
Calcium: 9 mg/dL (ref 8.4–10.5)
Chloride: 106 mEq/L (ref 96–112)
Creatinine, Ser: 0.9 mg/dL (ref 0.4–1.5)
GFR: 90.51 mL/min (ref >60.00)
Glucose, Bld: 82 mg/dL (ref 70–99)
Potassium: 3.7 mEq/L (ref 3.5–5.1)
Sodium: 142 mEq/L (ref 135–145)
Total Bilirubin: 0.4 mg/dL (ref 0.3–1.2)
Total Protein: 6.7 g/dL (ref 6.0–8.3)

## 2011-12-09 LAB — TSH: TSH: 6.51 u[IU]/mL — ABNORMAL HIGH (ref 0.35–5.50)

## 2011-12-09 LAB — LIPID PANEL
Cholesterol: 163 mg/dL (ref 0–200)
HDL: 55.7 mg/dL (ref >39.00)
LDL Cholesterol: 82 mg/dL (ref 0–99)
Total CHOL/HDL Ratio: 3
Triglycerides: 126 mg/dL (ref 0.0–149.0)
VLDL: 25.2 mg/dL (ref 0.0–40.0)

## 2011-12-13 ENCOUNTER — Ambulatory Visit (INDEPENDENT_AMBULATORY_CARE_PROVIDER_SITE_OTHER): Payer: Medicare Other | Admitting: Family Medicine

## 2011-12-13 ENCOUNTER — Encounter: Payer: Self-pay | Admitting: Family Medicine

## 2011-12-13 VITALS — BP 122/84 | HR 70 | Temp 98.2°F | Ht 70.0 in | Wt 179.0 lb

## 2011-12-13 DIAGNOSIS — E039 Hypothyroidism, unspecified: Secondary | ICD-10-CM

## 2011-12-13 DIAGNOSIS — R7402 Elevation of levels of lactic acid dehydrogenase (LDH): Secondary | ICD-10-CM | POA: Diagnosis not present

## 2011-12-13 DIAGNOSIS — Z8042 Family history of malignant neoplasm of prostate: Secondary | ICD-10-CM | POA: Diagnosis not present

## 2011-12-13 DIAGNOSIS — R7989 Other specified abnormal findings of blood chemistry: Secondary | ICD-10-CM

## 2011-12-13 DIAGNOSIS — R7401 Elevation of levels of liver transaminase levels: Secondary | ICD-10-CM | POA: Diagnosis not present

## 2011-12-13 DIAGNOSIS — Z0000 Encounter for general adult medical examination without abnormal findings: Secondary | ICD-10-CM | POA: Insufficient documentation

## 2011-12-13 DIAGNOSIS — Z125 Encounter for screening for malignant neoplasm of prostate: Secondary | ICD-10-CM | POA: Diagnosis not present

## 2011-12-13 DIAGNOSIS — R197 Diarrhea, unspecified: Secondary | ICD-10-CM

## 2011-12-13 DIAGNOSIS — E785 Hyperlipidemia, unspecified: Secondary | ICD-10-CM

## 2011-12-13 NOTE — Assessment & Plan Note (Signed)
Recheck LFT's in 1 month.

## 2011-12-13 NOTE — Patient Instructions (Addendum)
I would call the eye clinic about getting a routine check up.  Notify Dr. Russella Dar if you don't continue to improve.   Check on your next dermatology appointment.   We'll contact you with your lab report. Recheck your TSH and liver tests in 1 month.   Glad to see you.

## 2011-12-13 NOTE — Assessment & Plan Note (Signed)
Some improvement, he'll f/u with GI if not resolved.  Appreciate GI help.

## 2011-12-13 NOTE — Assessment & Plan Note (Addendum)
Recheck TSH in 1 month.  No TMG on exam.

## 2011-12-13 NOTE — Assessment & Plan Note (Signed)
Controlled, continue current meds and recheck LFTs in 1 month.

## 2011-12-13 NOTE — Assessment & Plan Note (Signed)
Continue current meds for memory and attention.  He is trying to compensate for memory changes.  He has to work but is 3/3 on recall today.  Has cards f/u pending.  No ICD discharge.

## 2011-12-13 NOTE — Progress Notes (Signed)
I have personally reviewed the Medicare Annual Wellness questionnaire and have noted 1. The patient's medical and social history 2. Their use of alcohol, tobacco or illicit drugs 3. Their current medications and supplements 4. The patient's functional ability including ADL's, fall risks, home safety risks and hearing or visual             impairment. 5. Diet and physical activities 6. Evidence for depression or mood disorders  The patients weight, height, BMI have been recorded in the chart and visual acuity is per eye clinic.  I have made referrals, counseling and provided education to the patient based review of the above and I have provided the pt with a written personalized care plan for preventive services.  Routine anticipatory guidance given to patient.  See health maintenance. FH prostate cancer, brother.  Due for PSA.  This is pending.   Flu shot encouraged. Tetanus 2010 Colon 2012 Chol 2013 See scanned forms.   Hypothyroid.  Prev with borderline TSH elevation.  No on meds.  No neck mass.   H/o diarrhea, some better after abx trial per GI.  No blood in stool. No vomiting.    Mild transaminitis.  Prev wnl on other lab panels.  No abd pain, no vomiting, no jaundice.  On statin.   H/o cardiac death with ICD. No CP, SOB, BLE edema.    Elevated Cholesterol: Using medications without problems:yes Muscle aches: no Diet compliance:yes Exercise:yes Labs d/w pt.   PMH and SH reviewed  Meds, vitals, and allergies reviewed.   ROS: See HPI.  Otherwise negative.    GEN: nad, alert and oriented HEENT: mucous membranes moist NECK: supple w/o LA CV: rrr. Pacer noted PULM: ctab, no inc wob ABD: soft, +bs EXT: no edema SKIN: no acute rash Prostate gland firm and smooth, no enlargement, nodularity, tenderness, mass, asymmetry or induration.

## 2011-12-14 ENCOUNTER — Encounter: Payer: Self-pay | Admitting: *Deleted

## 2011-12-14 ENCOUNTER — Encounter: Payer: Self-pay | Admitting: Internal Medicine

## 2011-12-14 ENCOUNTER — Ambulatory Visit (INDEPENDENT_AMBULATORY_CARE_PROVIDER_SITE_OTHER): Payer: Medicare Other | Admitting: Internal Medicine

## 2011-12-14 VITALS — BP 102/80 | HR 71 | Ht 70.0 in | Wt 177.0 lb

## 2011-12-14 DIAGNOSIS — R5383 Other fatigue: Secondary | ICD-10-CM | POA: Diagnosis not present

## 2011-12-14 DIAGNOSIS — Z95810 Presence of automatic (implantable) cardiac defibrillator: Secondary | ICD-10-CM | POA: Diagnosis not present

## 2011-12-14 DIAGNOSIS — E039 Hypothyroidism, unspecified: Secondary | ICD-10-CM

## 2011-12-14 DIAGNOSIS — R5381 Other malaise: Secondary | ICD-10-CM | POA: Diagnosis not present

## 2011-12-14 DIAGNOSIS — I10 Essential (primary) hypertension: Secondary | ICD-10-CM

## 2011-12-14 DIAGNOSIS — I472 Ventricular tachycardia: Secondary | ICD-10-CM | POA: Diagnosis not present

## 2011-12-14 LAB — ICD DEVICE OBSERVATION
BRDY-0002RV: 50 {beats}/min
DEV-0020ICD: NEGATIVE
DEVICE MODEL ICD: 277857
HV IMPEDENCE: 67 Ohm
RV LEAD AMPLITUDE: 13.9 mv
RV LEAD IMPEDENCE ICD: 778 Ohm
RV LEAD THRESHOLD: 1.5 V
TZON-0003FASTVT: 324.3 ms
VENTRICULAR PACING ICD: 8 pct

## 2011-12-14 LAB — PSA, MEDICARE: PSA: 0.35 ng/mL (ref <=4.00)

## 2011-12-14 MED ORDER — LEVOTHYROXINE SODIUM 25 MCG PO TABS: 25.0000 ug | ORAL_TABLET | Freq: Every day | ORAL | Status: DC

## 2011-12-14 NOTE — Assessment & Plan Note (Signed)
Given his fatigue, on and take the liberty of starting him on Synthroid. He'll followup with Dr. Para March.

## 2011-12-14 NOTE — Assessment & Plan Note (Signed)
The patient's device was interrogated.  The information was reviewed. No changes were made in the programming.    

## 2011-12-14 NOTE — Assessment & Plan Note (Signed)
He remains quite fatigued. We will start him on Synthroid 25 mcg a day. We'll also decrease his diltiazem 300-120.

## 2011-12-14 NOTE — Patient Instructions (Signed)
Your physician wants you to follow-up in: 3 months with Dr. [name]. You will receive a reminder letter in the mail two months in advance. If you don't receive a letter, please call our office to schedule the follow-up appointment.  Your physician has recommended you make the following change in your medication: start synthroid 25 mcg daily.

## 2011-12-14 NOTE — Progress Notes (Signed)
HPI  Larry [name] is a 60 y.o. 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  He underwent ICD generator replacement in Dec 2011  The patient denies chest pain, shortness of breath, nocturnal dyspnea, orthopnea or peripheral edema.  There have been no palpitations, lightheadedness or syncope.   Her biggest complaint is fatigue. He snores. He underwent a sleep study 5-10 years ago that was borderline abnormal. It are seen nor his wife however could tolerate a sleep therapy. He also had a TSH recently. It was elevated at 6.5  Past Medical History  Diagnosis Date  . Cardiac arrest     aborted  . Depression   . PVC (premature ventricular contraction)     associated with cardiac arrest  . Melanoma in situ of skin of trunk     chest  . Headache   . Low back pain syndrome   . ADD (attention deficit disorder)   . Dementia     post resusitative  . ICD (implantable cardiac defibrillator) in place   . Allergy   . Idiopathic urticaria   . Sleep apnea   . Hyperlipidemia   . GERD (gastroesophageal reflux disease)   . Tubular adenoma of colon 10/2010  . Diverticulosis   . Internal hemorrhoids   . Cancer     melanoma  . Hypothyroidism     Past Surgical History  Procedure Date  . Hemorrhoid surgery   .  implantation x2   . Icd   . Cardiac defibrillator removal 2004    Defibrillator change Larry [name])  . Tonsillectomy   . Cardiac defibrillator placement 07/01/2010    Explanation of a previously implanted device, pocket revision, and insertion of a new device, and intraoperative defibrillation threshold testing Larry [name])    Current Outpatient Prescriptions  Medication Sig Dispense Refill  . amphetamine-dextroamphetamine (ADDERALL) 10 MG tablet Take 1 tablet (10 mg total) by mouth 2 (two) times daily.  60 tablet  0  . Ascorbic Acid (VITAMIN C) 500 MG tablet Take 500 mg by mouth daily.        Marland Kitchen  aspirin 81 MG tablet Take 81 mg by mouth daily.      . Calcium Carbonate-Vit D-Min 600-400 MG-UNIT TABS Take 1 tablet by mouth.       . CRESTOR 10 MG tablet TAKE 1 TABLET BY MOUTH AT BEDTIME  30 tablet  11  . diltiazem (TIAZAC) 300 MG 24 hr capsule TAKE 1 CAPSULE EVERY DAY  30 capsule  6  . donepezil (ARICEPT) 5 MG tablet Take 5 mg by mouth at bedtime.       . fexofenadine (ALLEGRA) 180 MG tablet Take 180 mg by mouth daily.        . Ginkgo Biloba 40 MG TABS Take by mouth 2 (two) times daily.        . metoprolol tartrate (LOPRESSOR) 25 MG tablet TAKE 1 TABLET BY MOUTH TWICE A DAY  60 tablet  11  . Multiple Vitamin (MULTIVITAMIN) tablet Take 1 tablet by mouth daily.        . NON FORMULARY Focus factor 1 tab po bid       . Omega-3 Fatty Acids (FISH OIL) 1000 MG CAPS Take by mouth 1 dose over 46 hours.        . Probiotic Product (PROBIOTIC FORMULA) CAPS Take 1 capsule by mouth daily.      . vitamin E  400 UNIT capsule Take 400 Units by mouth daily.          Allergies  Allergen Reactions  . Penicillins     rash    Review of Systems negative except from HPI and PMH  Physical Exam BP 102/80  Pulse 71  Ht 5\' 10"  (1.778 m)  Wt 177 lb (80.287 kg)  BMI 25.40 kg/m2 Well developed and well nourished in no acute distress HENT normal E scleral and icterus clear Neck Supple JVP flat; carotids brisk and full Clear to ausculation Regular rate and rhythm, no murmurs gallops or rub Soft with active bowel sounds No clubbing cyanosis none Edema Alert and oriented, grossly normal motor and sensory function Skin Warm and Dry    Assessment and  Plan

## 2011-12-14 NOTE — Assessment & Plan Note (Signed)
No recurrent ventricular arrhythmias 

## 2011-12-15 ENCOUNTER — Other Ambulatory Visit: Payer: Medicare Other

## 2011-12-21 ENCOUNTER — Encounter: Payer: Medicare Other | Admitting: Family Medicine

## 2011-12-28 ENCOUNTER — Ambulatory Visit (INDEPENDENT_AMBULATORY_CARE_PROVIDER_SITE_OTHER): Payer: Medicare Other | Admitting: Gastroenterology

## 2011-12-28 ENCOUNTER — Encounter: Payer: Self-pay | Admitting: Gastroenterology

## 2011-12-28 VITALS — BP 120/80 | HR 60 | Ht 70.0 in | Wt 177.6 lb

## 2011-12-28 DIAGNOSIS — R197 Diarrhea, unspecified: Secondary | ICD-10-CM | POA: Diagnosis not present

## 2011-12-28 NOTE — Progress Notes (Signed)
History of Present Illness: This is a 61 year old male who returns in follow for diarrhea. After completing an empiric course of Cipro and Flagyl his diarrhea has almost resolved. He has resumed his typical long-term bowel habit pattern with IBS and mildly looser stools.   Current Medications, Allergies, Past Medical History, Past Surgical History, Family History and Social History were reviewed in [employer] record.  Physical Exam: General: Well developed , well nourished, no acute distress Head: Normocephalic and atraumatic Eyes:  sclerae anicteric, EOMI Ears: Normal auditory acuity Mouth: No deformity or lesions Lungs: Clear throughout to auscultation Heart: Regular rate and rhythm; no murmurs, rubs or bruits Abdomen: Soft, non tender and non distended. No masses, hepatosplenomegaly or hernias noted. Normal Bowel sounds Musculoskeletal: Symmetrical with no gross deformities  Extremities: No clubbing, cyanosis, edema or deformities noted Neurological: Alert oriented x 4, grossly nonfocal Psychological:  Alert and cooperative. Normal mood and affect  Assessment and Recommendations:  1. Diarrhea improved following antibiotics. Irritable bowel syndrome with looser stools. Avoid foods that exacerbate symptoms. Follow up as needed.

## 2011-12-28 NOTE — Patient Instructions (Signed)
Call for GI problems

## 2011-12-30 ENCOUNTER — Telehealth: Payer: Self-pay | Admitting: Internal Medicine

## 2011-12-30 DIAGNOSIS — I472 Ventricular tachycardia: Secondary | ICD-10-CM

## 2011-12-30 MED ORDER — DILTIAZEM HCL ER COATED BEADS 120 MG PO CP24: 120.0000 mg | ORAL_CAPSULE | Freq: Every day | ORAL | Status: DC

## 2011-12-30 NOTE — Telephone Encounter (Signed)
In reviewing the patient's chart, Dr. [name] was going to decrease diltiazem to 120 mg once daily. I informed the patient's wife of this. The patient has just picked up a one month supply of diltiazem 300 mg. He has a capsule. He will continue this until gone. I will mail a new prescription to the patient's home for the 120 mg capsule.

## 2011-12-30 NOTE — Telephone Encounter (Signed)
New Problem:    Patient called in because her husband's pharmacy told her that his diltiazem (TIAZAC) 300 MG 24 hr capsule that was supposed to be cut in half to a 150 Mg dose can only be fill at 180MG .  Please call back.

## 2012-01-03 DIAGNOSIS — 419620001 Death: Secondary | SNOMED CT | POA: Diagnosis not present

## 2012-01-03 DEATH — deceased

## 2012-01-11 DIAGNOSIS — G4733 Obstructive sleep apnea (adult) (pediatric): Secondary | ICD-10-CM | POA: Diagnosis not present

## 2012-01-11 DIAGNOSIS — G931 Anoxic brain damage, not elsewhere classified: Secondary | ICD-10-CM | POA: Diagnosis not present

## 2012-01-11 DIAGNOSIS — K769 Liver disease, unspecified: Secondary | ICD-10-CM | POA: Diagnosis not present

## 2012-01-18 ENCOUNTER — Other Ambulatory Visit: Payer: Self-pay | Admitting: Family Medicine

## 2012-01-18 MED ORDER — AMPHETAMINE-DEXTROAMPHETAMINE 10 MG PO TABS: 10.0000 mg | ORAL_TABLET | Freq: Two times a day (BID) | ORAL | Status: DC

## 2012-01-18 NOTE — Telephone Encounter (Signed)
Please give to patient after I sign. Thanks.  

## 2012-01-18 NOTE — Telephone Encounter (Signed)
Pt is needing refill on his10 mg Adderall.

## 2012-01-18 NOTE — Telephone Encounter (Signed)
LMOVM of patient's home number.  Rx left at front desk for pick up.

## 2012-02-09 ENCOUNTER — Other Ambulatory Visit: Payer: Self-pay | Admitting: Cardiovascular Disease

## 2012-02-24 ENCOUNTER — Other Ambulatory Visit: Payer: Self-pay

## 2012-02-24 MED ORDER — AMPHETAMINE-DEXTROAMPHETAMINE 10 MG PO TABS: 10.0000 mg | ORAL_TABLET | Freq: Two times a day (BID) | ORAL | Status: DC

## 2012-02-24 NOTE — Telephone Encounter (Signed)
Patient advised.  Rx left at front desk for pick up. 

## 2012-02-24 NOTE — Telephone Encounter (Signed)
Printed, please give to pt.  Thanks.

## 2012-02-24 NOTE — Telephone Encounter (Signed)
Pt request rx Adderall. Call when ready for pick up. 

## 2012-02-29 ENCOUNTER — Ambulatory Visit (INDEPENDENT_AMBULATORY_CARE_PROVIDER_SITE_OTHER): Payer: Medicare Other | Admitting: Cardiovascular Disease

## 2012-02-29 ENCOUNTER — Encounter: Payer: Self-pay | Admitting: Cardiovascular Disease

## 2012-02-29 VITALS — BP 131/84 | HR 74 | Ht 69.0 in | Wt 179.8 lb

## 2012-02-29 DIAGNOSIS — I472 Ventricular tachycardia: Secondary | ICD-10-CM | POA: Diagnosis not present

## 2012-02-29 DIAGNOSIS — I10 Essential (primary) hypertension: Secondary | ICD-10-CM | POA: Diagnosis not present

## 2012-02-29 NOTE — Assessment & Plan Note (Signed)
Followed by Dr. [name]. Remote event with no recent arrhythmias.

## 2012-02-29 NOTE — Assessment & Plan Note (Signed)
Blood pressure is well controlled on a combination of diltiazem and metoprolol.

## 2012-02-29 NOTE — Progress Notes (Signed)
HPI:  61 year old woman presented for followup evaluation. The patient has a history of aborted sudden cardiac death remotely. He had a ICD generator change last year. He's had normal coronary arteries by remote cardiac catheterization at the time of his cardiac arrest.  He recently saw Dr. [name] and his diltiazem dose was reduced. He's noticed improved energy since that medicine changes made. Overall he is doing well he denies chest pain, dyspnea, edema, or palpitations. He's had a recent episodes of lightheadedness or syncope. He was out doing yard work this morning and had no problems with that level of activity.  Outpatient Encounter Prescriptions as of 02/29/2012  Medication Sig Dispense Refill  . amphetamine-dextroamphetamine (ADDERALL) 10 MG tablet Take 1 tablet (10 mg total) by mouth 2 (two) times daily.  60 tablet  0  . Ascorbic Acid (VITAMIN C) 500 MG tablet Take 500 mg by mouth daily.        Marland Kitchen aspirin 81 MG tablet Take 81 mg by mouth daily.      . Calcium Carbonate-Vit D-Min 600-400 MG-UNIT TABS Take 1 tablet by mouth.       . CRESTOR 10 MG tablet TAKE 1 TABLET BY MOUTH AT BEDTIME  30 tablet  11  . diltiazem (CARDIZEM CD) 120 MG 24 hr capsule Take 1 capsule (120 mg total) by mouth daily.  30 capsule  6  . donepezil (ARICEPT) 5 MG tablet Take 5 mg by mouth at bedtime.       . fexofenadine (ALLEGRA) 180 MG tablet Take 180 mg by mouth daily.        . Ginkgo Biloba 40 MG TABS Take by mouth 2 (two) times daily.        Marland Kitchen levothyroxine (LEVOTHROID) 25 MCG tablet Take 1 tablet (25 mcg total) by mouth daily.  30 tablet  3  . metoprolol tartrate (LOPRESSOR) 25 MG tablet TAKE 1 TABLET BY MOUTH TWICE A DAY  60 tablet  11  . Multiple Vitamin (MULTIVITAMIN) tablet Take 1 tablet by mouth daily.        . NON FORMULARY Focus factor 1 tab po bid       . Omega-3 Fatty Acids (FISH OIL) 1000 MG CAPS Take by mouth 1 dose over 46 hours.        . vitamin E 400 UNIT capsule Take 400 Units by mouth daily.         Marland Kitchen DISCONTD: Probiotic Product (PROBIOTIC FORMULA) CAPS Take 1 capsule by mouth daily.        Allergies  Allergen Reactions  . Penicillins     rash    Past Medical History  Diagnosis Date  . Cardiac arrest     aborted  . Depression   . PVC (premature ventricular contraction)     associated with cardiac arrest  . Melanoma in situ of skin of trunk     chest  . Headache   . Low back pain syndrome   . ADD (attention deficit disorder)   . Dementia     post resusitative  . ICD (implantable cardiac defibrillator) in place   . Allergy   . Idiopathic urticaria   . Sleep apnea   . Hyperlipidemia   . GERD (gastroesophageal reflux disease)   . Tubular adenoma of colon 10/2010  . Diverticulosis   . Internal hemorrhoids   . Cancer     melanoma  . Hypothyroidism     ROS: Negative except as per HPI  BP 131/84  Pulse  74  Ht 5\' 9"  (1.753 m)  Wt 179 lb 12.8 oz (81.557 kg)  BMI 26.55 kg/m2  PHYSICAL EXAM: Pt is alert and oriented, NAD HEENT: normal Neck: JVP - normal, carotids 2+= without bruits Lungs: CTA bilaterally CV: RRR without murmur or gallop Abd: soft, NT, Positive BS, no hepatomegaly Ext: no C/C/E, distal pulses intact and equal Skin: warm/dry no rash  ASSESSMENT AND PLAN:

## 2012-02-29 NOTE — Patient Instructions (Signed)
Your physician wants you to follow-up in: 6 MONTHS.  You will receive a reminder letter in the mail two months in advance. If you don't receive a letter, please call our office to schedule the follow-up appointment.  Your physician recommends that you continue on your current medications as directed. Please refer to the Current Medication list given to you today.  

## 2012-03-07 DIAGNOSIS — G4733 Obstructive sleep apnea (adult) (pediatric): Secondary | ICD-10-CM | POA: Diagnosis not present

## 2012-03-16 ENCOUNTER — Ambulatory Visit (INDEPENDENT_AMBULATORY_CARE_PROVIDER_SITE_OTHER): Payer: Medicare Other | Admitting: Internal Medicine

## 2012-03-16 ENCOUNTER — Encounter: Payer: Self-pay | Admitting: Internal Medicine

## 2012-03-16 VITALS — BP 120/80 | HR 75 | Ht 70.0 in | Wt 179.0 lb

## 2012-03-16 DIAGNOSIS — E039 Hypothyroidism, unspecified: Secondary | ICD-10-CM | POA: Diagnosis not present

## 2012-03-16 DIAGNOSIS — Z95810 Presence of automatic (implantable) cardiac defibrillator: Secondary | ICD-10-CM | POA: Diagnosis not present

## 2012-03-16 DIAGNOSIS — I472 Ventricular tachycardia: Secondary | ICD-10-CM | POA: Diagnosis not present

## 2012-03-16 DIAGNOSIS — I10 Essential (primary) hypertension: Secondary | ICD-10-CM | POA: Diagnosis not present

## 2012-03-16 LAB — ICD DEVICE OBSERVATION
BRDY-0002RV: 50 {beats}/min
DEV-0020ICD: NEGATIVE
DEVICE MODEL ICD: 277857
HV IMPEDENCE: 66 Ohm
RV LEAD AMPLITUDE: 14.1 mv
RV LEAD IMPEDENCE ICD: 769 Ohm
RV LEAD THRESHOLD: 1.4 V
TZON-0003FASTVT: 324.3 ms
VENTRICULAR PACING ICD: 7 pct

## 2012-03-16 NOTE — Assessment & Plan Note (Signed)
The patient's device was interrogated.  The information was reviewed. No changes were made in the programming.    

## 2012-03-16 NOTE — Progress Notes (Signed)
HPI  Larry [name] is a 61 y.o. 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  He underwent ICD generator replacement in Dec 2011  The patient denies chest pain, shortness of breath, nocturnal dyspnea, orthopnea or peripheral edema.  There have been no palpitations, lightheadedness or syncope.   When we saw him last in June, TSH was modestly elevated and because of fatigue we also decrease his diltiazem from 300-120. He is seeing Dr. Excell Seltzer in the interim who noted that he was feeling considerably better.  Past Medical History  Diagnosis Date  . Cardiac arrest     aborted  . Depression   . PVC (premature ventricular contraction)     associated with cardiac arrest  . Melanoma in situ of skin of trunk     chest  . Headache   . Low back pain syndrome   . ADD (attention deficit disorder)   . Dementia     post resusitative  . ICD (implantable cardiac defibrillator) in place   . Allergy   . Idiopathic urticaria   . Sleep apnea   . Hyperlipidemia   . GERD (gastroesophageal reflux disease)   . Tubular adenoma of colon 10/2010  . Diverticulosis   . Internal hemorrhoids   . Cancer     melanoma  . Hypothyroidism     Past Surgical History  Procedure Date  . Hemorrhoid surgery   .  implantation x2   . Icd   . Cardiac defibrillator removal 2004    Defibrillator change [name])  . Tonsillectomy   . Cardiac defibrillator placement 07/01/2010    Explanation of a previously implanted device, pocket revision, and insertion of a new device, and intraoperative defibrillation threshold testing [name])    Current Outpatient Prescriptions  Medication Sig Dispense Refill  . amphetamine-dextroamphetamine (ADDERALL) 10 MG tablet Take 1 tablet (10 mg total) by mouth 2 (two) times daily.  60 tablet  0  . Ascorbic Acid (VITAMIN C) 500 MG tablet Take 500 mg by mouth daily.        Marland Kitchen aspirin 81 MG tablet  Take 81 mg by mouth daily.      . Calcium Carbonate-Vit D-Min 600-400 MG-UNIT TABS Take 1 tablet by mouth.       . CRESTOR 10 MG tablet TAKE 1 TABLET BY MOUTH AT BEDTIME  30 tablet  11  . diltiazem (CARDIZEM CD) 120 MG 24 hr capsule Take 1 capsule (120 mg total) by mouth daily.  30 capsule  6  . donepezil (ARICEPT) 5 MG tablet Take 5 mg by mouth at bedtime.       . fexofenadine (ALLEGRA) 180 MG tablet Take 180 mg by mouth daily.        . Ginkgo Biloba 40 MG TABS Take by mouth 2 (two) times daily.        Marland Kitchen levothyroxine (LEVOTHROID) 25 MCG tablet Take 1 tablet (25 mcg total) by mouth daily.  30 tablet  3  . metoprolol tartrate (LOPRESSOR) 25 MG tablet TAKE 1 TABLET BY MOUTH TWICE A DAY  60 tablet  11  . Multiple Vitamin (MULTIVITAMIN) tablet Take 1 tablet by mouth daily.        . NON FORMULARY Focus factor 1 tab po bid       . Omega-3 Fatty Acids (FISH OIL) 1000 MG CAPS Take by mouth 1 dose over 46 hours.        Marland Kitchen  vitamin E 400 UNIT capsule Take 400 Units by mouth daily.          Allergies  Allergen Reactions  . Penicillins     rash    Review of Systems negative except from HPI and PMH  Physical Exam BP 120/80  Pulse 75  Ht 5\' 10"  (1.778 m)  Wt 179 lb (81.194 kg)  BMI 25.68 kg/m2 Well developed and well nourished in no acute distress HENT normal E scleral and icterus clear Neck Supple JVP flat; carotids brisk and full Clear to ausculation Regular rate and rhythm, no murmurs gallops or rub Soft with active bowel sounds No clubbing cyanosis none Edema Alert and oriented, grossly normal motor and sensory function Skin Warm and Dry  Electrocardiogram demonstrates sinus rhythm at 73 Intervals 15/10/40 Axis is 50 Nonspecific T wave changes   Assessment and  Plan

## 2012-03-16 NOTE — Patient Instructions (Addendum)
Your physician wants you to follow-up in: 1 year with Dr. [name]. You will receive a reminder letter in the mail two months in advance. If you don't receive a letter, please call our office to schedule the follow-up appointment.  Follow up in 3 months with Gunnar Fusi.   Your physician recommends that you return for lab work in: TODAY

## 2012-03-16 NOTE — Assessment & Plan Note (Signed)
We'll recheck his TSH today

## 2012-03-16 NOTE — Assessment & Plan Note (Signed)
Rhythm stable on low-dose beta blockers and calcium blockers

## 2012-03-16 NOTE — Assessment & Plan Note (Signed)
Well-controlled on current medical regime

## 2012-03-16 NOTE — Assessment & Plan Note (Signed)
No intercurrent Ventricular tachycardia  

## 2012-03-17 LAB — TSH: TSH: 6.16 u[IU]/mL — ABNORMAL HIGH (ref 0.450–4.500)

## 2012-03-21 ENCOUNTER — Other Ambulatory Visit: Payer: Self-pay

## 2012-03-21 MED ORDER — AMPHETAMINE-DEXTROAMPHETAMINE 10 MG PO TABS: 10.0000 mg | ORAL_TABLET | Freq: Two times a day (BID) | ORAL | Status: DC

## 2012-03-21 NOTE — Telephone Encounter (Signed)
Pt left v/m requesting Adderall rx ASAP. Call when ready for pick up.

## 2012-03-21 NOTE — Telephone Encounter (Signed)
Wife advised.  Rx left at front desk for pick up.  

## 2012-03-21 NOTE — Telephone Encounter (Signed)
Please  give to patient

## 2012-03-28 ENCOUNTER — Encounter: Payer: Self-pay | Admitting: Family Medicine

## 2012-04-04 DIAGNOSIS — 419620001 Death: Secondary | SNOMED CT

## 2012-04-04 DEATH — deceased

## 2012-04-10 ENCOUNTER — Other Ambulatory Visit: Payer: Self-pay

## 2012-04-10 DIAGNOSIS — E039 Hypothyroidism, unspecified: Secondary | ICD-10-CM

## 2012-04-10 MED ORDER — LEVOTHYROXINE SODIUM 50 MCG PO TABS: 50.0000 ug | ORAL_TABLET | Freq: Every day | ORAL | Status: DC

## 2012-05-04 ENCOUNTER — Other Ambulatory Visit: Payer: Self-pay

## 2012-05-04 MED ORDER — AMPHETAMINE-DEXTROAMPHETAMINE 10 MG PO TABS: 10.0000 mg | ORAL_TABLET | Freq: Two times a day (BID) | ORAL | Status: DC

## 2012-05-04 NOTE — Telephone Encounter (Signed)
Printed, please give to patient.  

## 2012-05-04 NOTE — Telephone Encounter (Signed)
Patient advised.  Rx left at front desk for pick up. 

## 2012-05-04 NOTE — Telephone Encounter (Signed)
Pt left v/m requesting rx Adderall ASAP,call when ready for pick up.

## 2012-05-08 ENCOUNTER — Other Ambulatory Visit: Payer: Medicare Other

## 2012-05-08 ENCOUNTER — Telehealth: Payer: Self-pay

## 2012-05-08 NOTE — Telephone Encounter (Signed)
Pt informed. Coming today

## 2012-05-08 NOTE — Telephone Encounter (Signed)
Due for tsh

## 2012-05-09 ENCOUNTER — Ambulatory Visit (INDEPENDENT_AMBULATORY_CARE_PROVIDER_SITE_OTHER): Payer: Medicare Other

## 2012-05-09 DIAGNOSIS — E039 Hypothyroidism, unspecified: Secondary | ICD-10-CM | POA: Diagnosis not present

## 2012-05-10 LAB — TSH: TSH: 5.49 u[IU]/mL — ABNORMAL HIGH (ref 0.450–4.500)

## 2012-05-11 DIAGNOSIS — D485 Neoplasm of uncertain behavior of skin: Secondary | ICD-10-CM | POA: Diagnosis not present

## 2012-05-11 DIAGNOSIS — D2360 Other benign neoplasm of skin of unspecified upper limb, including shoulder: Secondary | ICD-10-CM | POA: Diagnosis not present

## 2012-05-11 DIAGNOSIS — D235 Other benign neoplasm of skin of trunk: Secondary | ICD-10-CM | POA: Diagnosis not present

## 2012-05-11 DIAGNOSIS — L570 Actinic keratosis: Secondary | ICD-10-CM | POA: Diagnosis not present

## 2012-05-11 DIAGNOSIS — Z85820 Personal history of malignant melanoma of skin: Secondary | ICD-10-CM | POA: Diagnosis not present

## 2012-06-05 DIAGNOSIS — G931 Anoxic brain damage, not elsewhere classified: Secondary | ICD-10-CM | POA: Diagnosis not present

## 2012-06-05 DIAGNOSIS — G4710 Hypersomnia, unspecified: Secondary | ICD-10-CM | POA: Diagnosis not present

## 2012-06-05 DIAGNOSIS — G4733 Obstructive sleep apnea (adult) (pediatric): Secondary | ICD-10-CM | POA: Diagnosis not present

## 2012-06-09 ENCOUNTER — Other Ambulatory Visit: Payer: Self-pay

## 2012-06-09 MED ORDER — AMPHETAMINE-DEXTROAMPHETAMINE 10 MG PO TABS: 10.0000 mg | ORAL_TABLET | Freq: Two times a day (BID) | ORAL | Status: DC

## 2012-06-09 NOTE — Telephone Encounter (Signed)
Pt request rx addderall. Call when ready for pick up.

## 2012-06-09 NOTE — Telephone Encounter (Signed)
Patient advised.  Rx left at front desk for pick up. 

## 2012-06-09 NOTE — Telephone Encounter (Signed)
Printed.  Thanks.  

## 2012-06-21 ENCOUNTER — Ambulatory Visit (INDEPENDENT_AMBULATORY_CARE_PROVIDER_SITE_OTHER): Payer: Medicare Other | Admitting: *Deleted

## 2012-06-21 ENCOUNTER — Encounter: Payer: Self-pay | Admitting: Internal Medicine

## 2012-06-21 DIAGNOSIS — I472 Ventricular tachycardia, unspecified: Secondary | ICD-10-CM

## 2012-06-21 LAB — ICD DEVICE OBSERVATION
DEV-0020ICD: NEGATIVE
DEVICE MODEL ICD: 277857
HV IMPEDENCE: 68 Ohm
RV LEAD IMPEDENCE ICD: 772 Ohm
TZON-0003FASTVT: 324.3 ms
VENTRICULAR PACING ICD: 5 pct

## 2012-06-21 NOTE — Progress Notes (Signed)
ICD check 

## 2012-07-12 ENCOUNTER — Other Ambulatory Visit: Payer: Self-pay

## 2012-07-12 MED ORDER — AMPHETAMINE-DEXTROAMPHETAMINE 10 MG PO TABS: 10.0000 mg | ORAL_TABLET | Freq: Two times a day (BID) | ORAL | Status: DC

## 2012-07-12 NOTE — Telephone Encounter (Signed)
Pt request rx adderall. Call when ready for pick up. 

## 2012-07-12 NOTE — Telephone Encounter (Signed)
Printed.  Thanks.  

## 2012-07-12 NOTE — Telephone Encounter (Signed)
Rx placed up front for pick up. Patient aware.  

## 2012-07-27 DIAGNOSIS — F329 Major depressive disorder, single episode, unspecified: Secondary | ICD-10-CM | POA: Diagnosis not present

## 2012-07-27 DIAGNOSIS — G4733 Obstructive sleep apnea (adult) (pediatric): Secondary | ICD-10-CM | POA: Diagnosis not present

## 2012-07-27 DIAGNOSIS — G931 Anoxic brain damage, not elsewhere classified: Secondary | ICD-10-CM | POA: Diagnosis not present

## 2012-08-10 ENCOUNTER — Other Ambulatory Visit: Payer: Self-pay | Admitting: *Deleted

## 2012-08-10 MED ORDER — AMPHETAMINE-DEXTROAMPHETAMINE 10 MG PO TABS: 10.0000 mg | ORAL_TABLET | Freq: Two times a day (BID) | ORAL | Status: DC

## 2012-08-10 NOTE — Telephone Encounter (Signed)
Please call pt when rx is ready to pick up

## 2012-08-10 NOTE — Telephone Encounter (Signed)
Wife advised.  Rx left at front desk for follow up.

## 2012-08-10 NOTE — Telephone Encounter (Signed)
Printed.  Thanks.  

## 2012-09-04 ENCOUNTER — Other Ambulatory Visit: Payer: Self-pay | Admitting: Internal Medicine

## 2012-09-14 ENCOUNTER — Encounter: Payer: Self-pay | Admitting: Cardiovascular Disease

## 2012-09-14 ENCOUNTER — Ambulatory Visit (INDEPENDENT_AMBULATORY_CARE_PROVIDER_SITE_OTHER): Payer: Medicare Other | Admitting: Cardiovascular Disease

## 2012-09-14 VITALS — BP 122/82 | HR 65 | Ht 70.0 in | Wt 176.4 lb

## 2012-09-14 DIAGNOSIS — I10 Essential (primary) hypertension: Secondary | ICD-10-CM

## 2012-09-14 NOTE — Patient Instructions (Addendum)
Your physician wants you to follow-up in: 6 months  You will receive a reminder letter in the mail two months in advance. If you don't receive a letter, please call our office to schedule the follow-up appointment.  Your physician recommends that you continue on your current medications as directed. Please refer to the Current Medication list given to you today.  

## 2012-09-14 NOTE — Progress Notes (Signed)
HPI:  62 year old gentleman presenting for followup evaluation. He has a history of remote cardiac arrest. He's had an ICD for many years and underwent a generator change in 2012. He's followed by Dr. [name]. Since I have been seeing him, he's had episodes of sinus tachycardia and he has been managed with diltiazem. He has done very well over the past few years without cardiac problems. He denies chest pain, palpitations, lightheadedness, or shortness of breath. He's had no leg swelling or weakness. He feels well.  Outpatient Encounter Prescriptions as of 09/14/2012  Medication Sig Dispense Refill  . amphetamine-dextroamphetamine (ADDERALL) 10 MG tablet Take 1 tablet (10 mg total) by mouth 2 (two) times daily.  60 tablet  0  . Ascorbic Acid (VITAMIN C) 500 MG tablet Take 500 mg by mouth daily.        Marland Kitchen aspirin 81 MG tablet Take 81 mg by mouth daily.      . Calcium Carbonate-Vit D-Min 600-400 MG-UNIT TABS Take 1 tablet by mouth.       . CRESTOR 10 MG tablet TAKE 1 TABLET BY MOUTH AT BEDTIME  30 tablet  11  . diltiazem (CARDIZEM CD) 120 MG 24 hr capsule TAKE ONE CAPSULE BY MOUTH EVERY DAY  30 capsule  6  . donepezil (ARICEPT) 5 MG tablet Take 5 mg by mouth at bedtime.       . fexofenadine (ALLEGRA) 180 MG tablet Take 180 mg by mouth daily.        . Ginkgo Biloba 40 MG TABS Take by mouth 2 (two) times daily.        Marland Kitchen levothyroxine (SYNTHROID) 50 MCG tablet Take 1 tablet (50 mcg total) by mouth daily.  90 tablet  3  . metoprolol tartrate (LOPRESSOR) 25 MG tablet TAKE 1 TABLET BY MOUTH TWICE A DAY  60 tablet  11  . Multiple Vitamin (MULTIVITAMIN) tablet Take 1 tablet by mouth daily.        . NON FORMULARY Focus factor 1 tab po bid       . Omega-3 Fatty Acids (FISH OIL) 1000 MG CAPS Take by mouth 1 dose over 46 hours.        . vitamin E 400 UNIT capsule Take 400 Units by mouth daily.        . [DISCONTINUED] brimonidine-timolol (COMBIGAN) 0.2-0.5 % ophthalmic solution Place 1 drop into both eyes every  12 (twelve) hours.       No facility-administered encounter medications on file as of 09/14/2012.    Allergies  Allergen Reactions  . Penicillins     rash    Past Medical History  Diagnosis Date  . Cardiac arrest     aborted  . Depression   . PVC (premature ventricular contraction)     associated with cardiac arrest  . Melanoma in situ of skin of trunk     chest  . Headache   . Low back pain syndrome   . ADD (attention deficit disorder)   . Dementia     post resusitative  . ICD (implantable cardiac defibrillator) in place   . Allergy   . Idiopathic urticaria   . Sleep apnea   . Hyperlipidemia   . GERD (gastroesophageal reflux disease)   . Tubular adenoma of colon 10/2010  . Diverticulosis   . Internal hemorrhoids   . Cancer     melanoma  . Hypothyroidism     ROS: Negative except as per HPI  BP 122/82  Pulse 65  Ht 5\' 10"  (1.778 m)  Wt 80.015 kg (176 lb 6.4 oz)  BMI 25.31 kg/m2  PHYSICAL EXAM: Pt is alert and oriented, NAD HEENT: normal Neck: JVP - normal, carotids 2+= without bruits Lungs: CTA bilaterally CV: RRR without murmur or gallop Abd: soft, NT, Positive BS, no hepatomegaly Ext: no C/C/E, distal pulses intact and equal Skin: warm/dry no rash  EKG:  Normal sinus rhythm 65 beats per minute, within normal limits the  ASSESSMENT AND PLAN: 1. Essential hypertension. Blood pressure is well controlled on current medications. He will continue on diltiazem and metoprolol without changes. I'll see him back in 6 months.  2. History of nonsustained VT. No recent arrhythmias. He remains stable.  Tonny Bollman 09/14/2012 11:42 AM

## 2012-09-27 ENCOUNTER — Other Ambulatory Visit: Payer: Self-pay

## 2012-09-27 ENCOUNTER — Ambulatory Visit (INDEPENDENT_AMBULATORY_CARE_PROVIDER_SITE_OTHER): Payer: Medicare Other | Admitting: *Deleted

## 2012-09-27 ENCOUNTER — Encounter: Payer: Self-pay | Admitting: Internal Medicine

## 2012-09-27 DIAGNOSIS — I472 Ventricular tachycardia: Secondary | ICD-10-CM

## 2012-09-27 LAB — ICD DEVICE OBSERVATION
BRDY-0002RV: 50 {beats}/min
DEV-0020ICD: NEGATIVE
DEVICE MODEL ICD: 277857
HV IMPEDENCE: 64 Ohm
RV LEAD AMPLITUDE: 11.9 mv
RV LEAD IMPEDENCE ICD: 767 Ohm
RV LEAD THRESHOLD: 1.1 V
TZON-0003FASTVT: 324.3 ms
VENTRICULAR PACING ICD: 4 pct

## 2012-09-27 NOTE — Progress Notes (Signed)
ICD check 

## 2012-10-11 ENCOUNTER — Other Ambulatory Visit: Payer: Self-pay

## 2012-10-11 ENCOUNTER — Other Ambulatory Visit: Payer: Self-pay | Admitting: Cardiovascular Disease

## 2012-10-11 MED ORDER — AMPHETAMINE-DEXTROAMPHETAMINE 10 MG PO TABS: 10.0000 mg | ORAL_TABLET | Freq: Two times a day (BID) | ORAL | Status: DC

## 2012-10-11 NOTE — Telephone Encounter (Signed)
Please print this (I didn't print it yet) and I'll sign. Thanks.

## 2012-10-11 NOTE — Telephone Encounter (Signed)
Pt left v/m requesting rx adderall. Call when ready for pick up. 

## 2012-10-12 MED ORDER — AMPHETAMINE-DEXTROAMPHETAMINE 10 MG PO TABS: 10.0000 mg | ORAL_TABLET | Freq: Two times a day (BID) | ORAL | Status: DC

## 2012-10-12 NOTE — Telephone Encounter (Signed)
Printed, signed.  Patient notified and Rx placed at front desk for pickup.

## 2012-10-18 ENCOUNTER — Telehealth: Payer: Self-pay

## 2012-10-18 NOTE — Telephone Encounter (Signed)
Prior authorization required Amphetamine salts; form on Dr Lianne Bushy desk.

## 2012-10-18 NOTE — Telephone Encounter (Signed)
Done. In my outbox.

## 2012-10-19 NOTE — Telephone Encounter (Signed)
Approval letter received; on Dr Armanda Heritage desk for signature and scanning.

## 2012-10-26 ENCOUNTER — Other Ambulatory Visit: Payer: Self-pay | Admitting: Family Medicine

## 2012-11-21 ENCOUNTER — Telehealth: Payer: Self-pay | Admitting: Family Medicine

## 2012-11-21 MED ORDER — AMPHETAMINE-DEXTROAMPHETAMINE 10 MG PO TABS: 10.0000 mg | ORAL_TABLET | Freq: Two times a day (BID) | ORAL | Status: DC

## 2012-11-21 NOTE — Telephone Encounter (Signed)
Patient advised.  Rx left at front desk for pick up. 

## 2012-11-21 NOTE — Telephone Encounter (Signed)
Printed, pt needs CPE this summer.  Thanks.

## 2012-11-21 NOTE — Telephone Encounter (Signed)
Caller: Larry [name]/Patient; Phone: [phone]; Reason for Call: Pt calling today 11/21/12 regarding needs refill on his Adderall.  PLEASE CALL HIM AT [phone] TO LET HIM KNOW WHEN HE CAN COME BY OFFICE TO PICK UP NEW SCRIPT.  Thanks.

## 2012-11-23 ENCOUNTER — Encounter: Payer: Self-pay | Admitting: Family Medicine

## 2012-11-28 DIAGNOSIS — Z79899 Other long term (current) drug therapy: Secondary | ICD-10-CM | POA: Diagnosis not present

## 2012-12-05 ENCOUNTER — Ambulatory Visit: Payer: Self-pay | Admitting: Nurse Practitioner

## 2012-12-11 ENCOUNTER — Encounter: Payer: Self-pay | Admitting: Family Medicine

## 2012-12-12 ENCOUNTER — Other Ambulatory Visit: Payer: Self-pay

## 2012-12-12 MED ORDER — AMPHETAMINE-DEXTROAMPHETAMINE 10 MG PO TABS: 10.0000 mg | ORAL_TABLET | Freq: Two times a day (BID) | ORAL | Status: DC

## 2012-12-12 NOTE — Telephone Encounter (Signed)
Pt left v/m requesting rx adderall; call when ready for pick up. Pt already scheduled CPX 02/15/13.

## 2012-12-12 NOTE — Telephone Encounter (Signed)
Printed.  Thanks.  

## 2012-12-12 NOTE — Telephone Encounter (Signed)
Left detailed message on voicemail. Rx left at front desk for pick up.  

## 2012-12-14 ENCOUNTER — Encounter: Payer: Self-pay | Admitting: Family Medicine

## 2012-12-25 ENCOUNTER — Ambulatory Visit (INDEPENDENT_AMBULATORY_CARE_PROVIDER_SITE_OTHER): Payer: Medicare Other | Admitting: *Deleted

## 2012-12-25 DIAGNOSIS — I472 Ventricular tachycardia: Secondary | ICD-10-CM

## 2012-12-25 LAB — ICD DEVICE OBSERVATION
BRDY-0002RV: 50 {beats}/min
CHARGE TIME: 8.9 s
DEV-0020ICD: NEGATIVE
DEVICE MODEL ICD: 277857
HV IMPEDENCE: 64 Ohm
RV LEAD AMPLITUDE: 11.5 mv
RV LEAD IMPEDENCE ICD: 752 Ohm
RV LEAD THRESHOLD: 1.2 V
TZON-0003FASTVT: 324.3 ms
VENTRICULAR PACING ICD: 3 pct

## 2012-12-25 NOTE — Progress Notes (Signed)
icd check in clinic  

## 2013-01-04 ENCOUNTER — Encounter: Payer: Self-pay | Admitting: Internal Medicine

## 2013-01-10 ENCOUNTER — Ambulatory Visit (INDEPENDENT_AMBULATORY_CARE_PROVIDER_SITE_OTHER): Payer: Medicare Other | Admitting: Family Medicine

## 2013-01-10 ENCOUNTER — Ambulatory Visit (INDEPENDENT_AMBULATORY_CARE_PROVIDER_SITE_OTHER)
Admission: RE | Admit: 2013-01-10 | Discharge: 2013-01-10 | Disposition: A | Discharge status: Home / Self Care | Payer: Medicare Other | Source: Ambulatory Visit | Attending: Family Medicine | Admitting: Family Medicine

## 2013-01-10 ENCOUNTER — Encounter: Payer: Self-pay | Admitting: Family Medicine

## 2013-01-10 VITALS — BP 118/80 | HR 80 | Temp 98.1°F | Wt 176.0 lb

## 2013-01-10 DIAGNOSIS — R0789 Other chest pain: Secondary | ICD-10-CM | POA: Insufficient documentation

## 2013-01-10 DIAGNOSIS — R079 Chest pain, unspecified: Secondary | ICD-10-CM | POA: Diagnosis not present

## 2013-01-10 NOTE — Patient Instructions (Signed)
Go to the lab on the way out.  We'll contact you with your xray report.  This should get better on its own.  It may be pleurisy and if so, you likely will improved without any specific treatment.   You could take a low dose of ibuprofen if needed for pain.  Pressing on the area during a deep breath will help with the pain.

## 2013-01-10 NOTE — Progress Notes (Signed)
Change in breathing noted.  Pain on R side of chest with inhaling.  Near the lower anterior ribs.  It radiates around the R lower ribs.  Started about 3 days ago, worse day before yesterday, much better today.  No trauma.  No falls.  He didn't know if he pulled a muscle.  No sx on the L side.  No local bruising or rash.  No FCNAVD.  No cough.    Rash on B arms and trunk noted.  More prominent yesterday, doesn't itch.  He has had this rash prev with concurrent illness, it would usually self resolve.     Meds, vitals, and allergies reviewed.   ROS: See HPI.  Otherwise, noncontributory.  GEN: nad, alert and oriented HEENT: mucous membranes moist NECK: supple w/o LA CV: rrr PULM: ctab, no inc wob, R anterior chest wall not ttp, pain improved with external compression during a deep breath ABD: soft, +bs EXT: no edema SKIN: blanching red nontermatomal rash noted on the B arms and trunk

## 2013-01-10 NOTE — Assessment & Plan Note (Signed)
cxr unremarkable.  Possible viral cause of pleurisy.  He usually gets a similar rash with illnesses and this could be the typical "marker" for him. Nontoxic. Would treat symptomatically.  Okay for outpatient f/u.  Already improving.  D/w pt.

## 2013-01-24 ENCOUNTER — Other Ambulatory Visit: Payer: Self-pay

## 2013-01-24 ENCOUNTER — Other Ambulatory Visit: Payer: Self-pay | Admitting: Family Medicine

## 2013-01-24 DIAGNOSIS — E7800 Pure hypercholesterolemia, unspecified: Secondary | ICD-10-CM

## 2013-01-24 DIAGNOSIS — E039 Hypothyroidism, unspecified: Secondary | ICD-10-CM

## 2013-01-24 DIAGNOSIS — Z125 Encounter for screening for malignant neoplasm of prostate: Secondary | ICD-10-CM

## 2013-01-24 MED ORDER — AMPHETAMINE-DEXTROAMPHETAMINE 10 MG PO TABS: 10.0000 mg | ORAL_TABLET | Freq: Two times a day (BID) | ORAL | Status: DC

## 2013-01-24 NOTE — Telephone Encounter (Signed)
Pt request rx adderall. Call when ready for pick up. 

## 2013-01-24 NOTE — Telephone Encounter (Signed)
Patient notified by telephone that script is up front and ready for pickup. 

## 2013-01-24 NOTE — Telephone Encounter (Signed)
Printed.  Thanks.  

## 2013-01-30 ENCOUNTER — Encounter: Payer: Self-pay | Admitting: Neurology

## 2013-01-30 ENCOUNTER — Ambulatory Visit (INDEPENDENT_AMBULATORY_CARE_PROVIDER_SITE_OTHER): Payer: Medicare Other | Admitting: Neurology

## 2013-01-30 VITALS — BP 117/85 | HR 75 | Temp 98.2°F | Ht 69.0 in | Wt 176.0 lb

## 2013-01-30 DIAGNOSIS — G931 Anoxic brain damage, not elsewhere classified: Secondary | ICD-10-CM

## 2013-01-30 DIAGNOSIS — F988 Other specified behavioral and emotional disorders with onset usually occurring in childhood and adolescence: Secondary | ICD-10-CM

## 2013-01-30 DIAGNOSIS — R413 Other amnesia: Secondary | ICD-10-CM | POA: Diagnosis not present

## 2013-01-30 NOTE — Patient Instructions (Addendum)
I think overall you are doing fairly well and are stable at this point.   I do have some generic suggestions for you today:  Please make sure that you drink plenty of fluids. I would like for you to exercise daily for example in the form of walking 20-30 minutes every day, if you can. Please keep a regular sleep-wake schedule, keep regular meal times, do not skip any meals, eat  healthy snacks in between meals, such as fruit or nuts. Try to eat protein with every meal.   As far as your medications are concerned, I would like to suggest: no changes.   As far as diagnostic testing, I recommend: no new test.  Engage in social activities in your community and with your family and try to keep up with current events by reading the newspaper or watching the news.  I do not think we need to make any changes in your medications at this point. I think you're stable enough that we can see you back in 6 months.   Our nurse, Hermenia Fiscal will call you for your next appointment with one of my associates.   Our phone number is [phone]. We also have an after hours call service for urgent matters and there is a physician on-call for urgent questions. For any emergencies you know to call 911 or go to the nearest emergency room.

## 2013-01-30 NOTE — Progress Notes (Signed)
Subjective:    Patient ID: Larry [name] is a 62 y.o. male.  HPI  Interim history:   Larry [name] is a very friendly 62 year old right-handed gentleman presents for followup consultation of his memory loss in the context of anoxic brain injury. He is accompanied by his wife today. This is his first visit after Dr. Imagene Gurney retirement and he was last seen by Dr. Fayrene Fearing love on 07/27/2012, which time Dr. Sandria Manly felt that the patient was stable but there were problems with his insurance not paying for his medications. The patient has an underlying medical history of cardiac death with hypoxic brain injury status post defibrillator placement, obstructive sleep apnea followed by Dr. Marcelyn Bruins. He's currently on Synthroid, vitamin E, probiotic, adoral, vitamin C, Benadryl, metoprolol, donepezil, Allegra, Tiazac, Crestor, aspirin, fish oil, calcium and vitamin D. He has a cardiologist, Dr. Excell Seltzer and his PCP is Dr. Para March.   I reviewed Dr. Imagene Gurney prior notes and the patient's records and below is a summary of that review:  62 year old gentleman with a history of cardiac arrest from V. fib in January 1999, status post pacemaker-defibrillator placement for sustained anoxic or hypoxic brain injury at this time of his cardiac arrest. EEG showed diffuse slowing in January 1999 and CT head as well as brain MRI were normal. CT head repeat in November 2000 showed slight prominence of the lateral and third ventricles. CT head in July 2003 showed prominence of the ventricular system. Psychological testing and August 1999 showed borderline performance IQ. He became depressed. In January 2014 his MMSE was 29, clock drawing was 4, animal fluency was 10.  He has had some fluctuation in his memory per wife. He has ADD and continues to take Adderall, which his PCP prescribes. He has no Hx of hallucinations. He has not been driving.    His Past Medical History Is Significant For: Past Medical History  Diagnosis Date  .  Cardiac arrest     aborted  . Depression   . PVC (premature ventricular contraction)     associated with cardiac arrest  . Melanoma in situ of skin of trunk     chest  . Headache(784.0)   . Low back pain syndrome   . ADD (attention deficit disorder)   . Dementia     post resusitative  . ICD (implantable cardiac defibrillator) in place   . Allergy   . Idiopathic urticaria   . Sleep apnea   . Hyperlipidemia   . GERD (gastroesophageal reflux disease)   . Tubular adenoma of colon 10/2010  . Diverticulosis   . Internal hemorrhoids   . Cancer     melanoma  . Hypothyroidism     His Past Surgical History Is Significant For: Past Surgical History  Procedure Laterality Date  . Hemorrhoid surgery    .  implantation x2    . Icd    . Cardiac defibrillator removal  2004    Defibrillator change [name])  . Tonsillectomy    . Cardiac defibrillator placement  07/01/2010    Explanation of a previously implanted device, pocket revision, and insertion of a new device, and intraoperative defibrillation threshold testing [name])    His Family History Is Significant For: Family History  Problem Relation Age of Onset  . Hypertension Mother   . Heart disease Father   . Diverticulosis Father   . Coronary artery disease Father   . Alopecia Father   . Diabetes Brother   . Prostate cancer Brother   .  Lung cancer Paternal Grandfather     Smoker  . Cancer Paternal Grandfather     lung cancer - smoker  . Colon cancer Neg Hx     His Social History Is Significant For: History   Social History  . Marital Status: Married    Spouse Name: [name]    Number of Children: 2  . Years of Education: College   Occupational History  .     Social History Main Topics  . Smoking status: Former Smoker -- 1.00 packs/day for 10 years    Quit date: 07/05/1992  . Smokeless tobacco: Never Used  . Alcohol Use: Yes     Comment: socially  . Drug Use: No  . Sexually Active: None   Other Topics Concern   . None   Social History Narrative   Married 1974 with 2 daughters   5 grandchildren   Daily caffeine use - one cup of coffee and one coke per day    His Allergies Are:  Allergies  Allergen Reactions  . Penicillins     rash  :   His Current Medications Are:  Outpatient Encounter Prescriptions as of 01/30/2013  Medication Sig Dispense Refill  . amphetamine-dextroamphetamine (ADDERALL) 10 MG tablet Take 1 tablet (10 mg total) by mouth 2 (two) times daily.  60 tablet  0  . Ascorbic Acid (VITAMIN C) 500 MG tablet Take 500 mg by mouth daily.        Marland Kitchen aspirin 81 MG tablet Take 81 mg by mouth daily.      . CRESTOR 10 MG tablet TAKE 1 TABLET BY MOUTH AT BEDTIME  30 tablet  11  . diltiazem (CARDIZEM CD) 120 MG 24 hr capsule TAKE ONE CAPSULE BY MOUTH EVERY DAY  30 capsule  6  . donepezil (ARICEPT) 5 MG tablet Take 5 mg by mouth at bedtime.       . fexofenadine (ALLEGRA) 180 MG tablet Take 180 mg by mouth daily.        . Ginkgo Biloba 40 MG TABS Take by mouth 2 (two) times daily.        Marland Kitchen levothyroxine (SYNTHROID) 50 MCG tablet Take 1 tablet (50 mcg total) by mouth daily.  90 tablet  3  . metoprolol tartrate (LOPRESSOR) 25 MG tablet TAKE 1 TABLET BY MOUTH TWICE A DAY  60 tablet  10  . Multiple Vitamin (MULTIVITAMIN) tablet Take 1 tablet by mouth daily.        . NON FORMULARY Focus factor 1 tab po bid       . Omega-3 Fatty Acids (FISH OIL) 1000 MG CAPS Take by mouth 1 dose over 46 hours.        Marland Kitchen SINGULAIR 10 MG tablet TAKE 1 TABLET BY MOUTH ONCE A DAY  30 tablet  6  . vitamin E 400 UNIT capsule Take 400 Units by mouth daily.         No facility-administered encounter medications on file as of 01/30/2013.  : Review of Systems  Neurological:       Memory loss Confusion  Psychiatric/Behavioral: Positive for sleep disturbance (snoring).    Objective:  Neurologic Exam  Physical Exam    Physical Examination:   Filed Vitals:   01/30/13 1005  BP: 117/85  Pulse: 75  Temp: 98.2 F  (36.8 C)    General Examination: The patient is a very pleasant 62 y.o. male in no acute distress. He is calm and cooperative with the exam. He denies Auditory  Hallucinations and Visual Hallucinations.   HEENT: Normocephalic, atraumatic, pupils are equal, round and reactive to light and accommodation. Extraocular tracking shows mild saccadic breakdown without nystagmus noted. Hearing is intact. Tympanic membranes are clear bilaterally. Face is symmetric with no facial masking and normal facial sensation. There is no lip, neck or jaw tremor. Neck is not rigid with intact passive ROM. There are no carotid bruits on auscultation. Oropharynx exam reveals mild mouth dryness. No significant airway crowding is noted. Mallampati is class II. Tongue protrudes centrally and palate elevates symmetrically.    Chest: is clear to auscultation without wheezing, rhonchi or crackles noted.  Heart: sounds are regular and normal without murmurs, rubs or gallops noted.   Abdomen: is soft, non-tender and non-distended with normal bowel sounds appreciated on auscultation.  Extremities: There is no pitting edema in the distal lower extremities bilaterally. Pedal pulses are intact.  Skin: is warm and dry with no trophic changes noted.  Musculoskeletal: exam reveals no obvious joint deformities, tenderness or joint swelling or erythema.  Neurologically:  Mental status: The patient is awake and alert, paying fair  attention. He is able to partially provide the history. His wife provides details. He is oriented to: person, place, time/date, situation, day of week, month of year and year. His memory, attention, language and knowledge are impaired mildly. There is no aphasia, agnosia, apraxia or anomia. There is a mild degree of bradyphrenia. Speech is not hypophonic with no dysarthria noted. Mood is congruent and affect is constricted.  His MMSE score is 27/30. CDT is 4/4. AFT (Animal Fluency Test) score is 8.   Cranial  nerves are as described above under HEENT exam. In addition, shoulder shrug is normal with equal shoulder height noted.  Motor exam: Normal bulk, and strength for age is noted. Tone is not rigid with absence of cogwheeling. There is overall mild bradykinesia. There is no drift or rebound. There is no tremor.   Romberg is negative. Reflexes are 1+ in the upper extremities and 1+ in the lower extremities. Toes are downgoing bilaterally. Fine motor skills: Finger taps, hand movements, and rapid alternating patting are mildly impaired bilaterally. Foot taps and foot agility are mildly impaired bilaterally.   Cerebellar testing shows no dysmetria or intention tremor on finger to nose testing. Heel to shin is unremarkable. There is no truncal or gait ataxia.   Sensory exam is intact to light touch, pinprick, vibration, temperature sense and proprioception in the upper and lower extremities.   Gait, station and balance: He stands up from the seated position with no significant difficulty. No veering to one side is noted. No leaning to one side. Posture is not stooped. Stance is narrow-based. He turns en bloc. Tandem walk is difficult for him. Balance is mildly impaired.   Assessment and Plan:   In summary, Larry [name] is a very pleasant 62 y.o.-year old male with a history of heart disease and memory loss d/t hypoxic brain injury. His physical exam is stable. He is doing fairly well at this time and I reassured the patient in that regard.  I had a long chat with the patient and his wife about my findings and his symptoms, the prognosis and treatment options. We talked about medical treatments and non-pharmacological approaches. We talked about maintaining a healthy lifestyle in general. I encouraged the patient to eat healthy, exercise daily and keep well hydrated, to keep a scheduled bedtime and wake time routine, to not skip any meals and eat healthy  snacks in between meals and to have protein with  every meal.   As far as further diagnostic testing is concerned, I suggested the following today: no new test.   As far as medications are concerned, I recommended the following at this time: no change.   I do not think we need to make any changes in his medications at this point. I think he is stable enough that he can be seen in 6 months. We need to monitor his mood and he and his wife were in agreement.    Our nurse, Hermenia Fiscal will call you for his next appointment with one of my associates.

## 2013-02-08 ENCOUNTER — Other Ambulatory Visit (INDEPENDENT_AMBULATORY_CARE_PROVIDER_SITE_OTHER): Payer: Medicare Other

## 2013-02-08 DIAGNOSIS — Z125 Encounter for screening for malignant neoplasm of prostate: Secondary | ICD-10-CM

## 2013-02-08 DIAGNOSIS — E7800 Pure hypercholesterolemia, unspecified: Secondary | ICD-10-CM

## 2013-02-08 DIAGNOSIS — E039 Hypothyroidism, unspecified: Secondary | ICD-10-CM

## 2013-02-08 LAB — LIPID PANEL
Cholesterol: 192 mg/dL (ref 0–200)
HDL: 52.4 mg/dL (ref >39.00)
LDL Cholesterol: 121 mg/dL — ABNORMAL HIGH (ref 0–99)
Total CHOL/HDL Ratio: 4
Triglycerides: 91 mg/dL (ref 0.0–149.0)
VLDL: 18.2 mg/dL (ref 0.0–40.0)

## 2013-02-08 LAB — COMPREHENSIVE METABOLIC PANEL
ALT: 31 U/L (ref 0–53)
AST: 23 U/L (ref 0–37)
Albumin: 4.1 g/dL (ref 3.5–5.2)
Alkaline Phosphatase: 56 U/L (ref 39–117)
BUN: 11 mg/dL (ref 6–23)
CO2: 31 mEq/L (ref 19–32)
Calcium: 9.7 mg/dL (ref 8.4–10.5)
Chloride: 105 mEq/L (ref 96–112)
Creatinine, Ser: 1 mg/dL (ref 0.4–1.5)
GFR: 80.86 mL/min (ref >60.00)
Glucose, Bld: 86 mg/dL (ref 70–99)
Potassium: 4.3 mEq/L (ref 3.5–5.1)
Sodium: 140 mEq/L (ref 135–145)
Total Bilirubin: 0.8 mg/dL (ref 0.3–1.2)
Total Protein: 6.6 g/dL (ref 6.0–8.3)

## 2013-02-08 LAB — TSH: TSH: 6.49 u[IU]/mL — ABNORMAL HIGH (ref 0.35–5.50)

## 2013-02-08 LAB — PSA, MEDICARE: PSA: 0.32 ng/ml (ref 0.10–4.00)

## 2013-02-15 ENCOUNTER — Encounter: Payer: Self-pay | Admitting: Family Medicine

## 2013-02-15 ENCOUNTER — Ambulatory Visit (INDEPENDENT_AMBULATORY_CARE_PROVIDER_SITE_OTHER): Payer: Medicare Other | Admitting: Family Medicine

## 2013-02-15 VITALS — BP 110/80 | HR 90 | Temp 97.8°F | Ht 69.0 in | Wt 175.5 lb

## 2013-02-15 DIAGNOSIS — Z0000 Encounter for general adult medical examination without abnormal findings: Secondary | ICD-10-CM

## 2013-02-15 DIAGNOSIS — E039 Hypothyroidism, unspecified: Secondary | ICD-10-CM

## 2013-02-15 MED ORDER — LEVOTHYROXINE SODIUM 50 MCG PO TABS: 50.0000 ug | ORAL_TABLET | Freq: Every day | ORAL | Status: DC

## 2013-02-15 MED ORDER — AMPHETAMINE-DEXTROAMPHETAMINE 10 MG PO TABS: 10.0000 mg | ORAL_TABLET | Freq: Two times a day (BID) | ORAL | Status: DC

## 2013-02-15 NOTE — Patient Instructions (Addendum)
Change to thyroid medicine- 1 a day 6 days a week but take 1.5 tabs on Sundays.  Recheck TSH in 2 months.  I would get a flu shot each fall.   Check with your insurance to see if they will cover the shingles shot. Take care.

## 2013-02-16 DIAGNOSIS — Z0000 Encounter for general adult medical examination without abnormal findings: Secondary | ICD-10-CM | POA: Insufficient documentation

## 2013-02-16 NOTE — Progress Notes (Signed)
I have personally reviewed the Medicare Annual Wellness questionnaire and have noted 1. The patient's medical and social history 2. Their use of alcohol, tobacco or illicit drugs 3. Their current medications and supplements 4. The patient's functional ability including ADL's, fall risks, home safety risks and hearing or visual             impairment. 5. Diet and physical activities 6. Evidence for depression or mood disorders  The patients weight, height, BMI have been recorded in the chart and visual acuity is per eye clinic.  I have made referrals, counseling and provided education to the patient based review of the above and I have provided the pt with a written personalized care plan for preventive services.  See scanned forms.  Routine anticipatory guidance given to patient.  See health maintenance. Flu encouraged Shingles dw pt Tetanus 2010 Colonoscopy 2012 PSA wnl Cognitive function addressed- see scanned forms- and if abnormal then additional documentation follows.    Dementia per neuro.    Hypothyroid. TSH mildly up.  Discussed, no neck mass.   PMH and SH reviewed  Meds, vitals, and allergies reviewed.   ROS: See HPI.  Otherwise negative.    GEN: nad, alert and oriented HEENT: mucous membranes moist NECK: supple w/o LA, no tmg CV: rrr. PULM: ctab, no inc wob ABD: soft, +bs EXT: no edema SKIN: no acute rash

## 2013-02-16 NOTE — Assessment & Plan Note (Signed)
Inc to qd day except for on Sundays, recheck in ~2 months.

## 2013-02-16 NOTE — Assessment & Plan Note (Signed)
See scanned forms.  Routine anticipatory guidance given to patient.  See health maintenance. Flu encouraged Shingles dw pt Tetanus 2010 Colonoscopy 2012 PSA wnl Cognitive function addressed- see scanned forms- and if abnormal then additional documentation follows.

## 2013-02-23 ENCOUNTER — Telehealth: Payer: Self-pay

## 2013-02-23 NOTE — Telephone Encounter (Signed)
Pt requesting refill adderall. When pt was seen 02/15/13 prescription was printed. Pt looked thru [facility] and found adderall rx. Pt has no other needs today.

## 2013-03-13 ENCOUNTER — Ambulatory Visit (INDEPENDENT_AMBULATORY_CARE_PROVIDER_SITE_OTHER): Payer: Medicare Other | Admitting: Cardiovascular Disease

## 2013-03-13 ENCOUNTER — Encounter: Payer: Self-pay | Admitting: Cardiovascular Disease

## 2013-03-13 VITALS — BP 122/94 | HR 79 | Ht 69.5 in | Wt 176.0 lb

## 2013-03-13 DIAGNOSIS — I472 Ventricular tachycardia: Secondary | ICD-10-CM | POA: Diagnosis not present

## 2013-03-13 NOTE — Progress Notes (Signed)
HPI:  62 year old gentleman presenting for cardiac followup. He has a history of remote cardiac arrest and anoxic brain injury. He's followed by Dr. [name] since an ICD implantation. He's doing quite well and has no complaints today. He denies chest pain, palpitations, lightheadedness, or shortness of breath. He has chronic fatigue. His grandson just started kindergarten a few weeks ago.  Outpatient Encounter Prescriptions as of 03/13/2013  Medication Sig Dispense Refill  . amphetamine-dextroamphetamine (ADDERALL) 10 MG tablet Take 1 tablet (10 mg total) by mouth 2 (two) times daily.  60 tablet  0  . Ascorbic Acid (VITAMIN C) 500 MG tablet Take 500 mg by mouth daily.        Marland Kitchen aspirin 81 MG tablet Take 81 mg by mouth daily.      . CRESTOR 10 MG tablet TAKE 1 TABLET BY MOUTH AT BEDTIME  30 tablet  11  . diltiazem (CARDIZEM CD) 120 MG 24 hr capsule TAKE ONE CAPSULE BY MOUTH EVERY DAY  30 capsule  6  . donepezil (ARICEPT) 5 MG tablet Take 5 mg by mouth at bedtime.       . fexofenadine (ALLEGRA) 180 MG tablet Take 180 mg by mouth daily.        . Ginkgo Biloba 40 MG TABS Take by mouth 2 (two) times daily.        Marland Kitchen levothyroxine (SYNTHROID) 50 MCG tablet Take 1 tablet (50 mcg total) by mouth daily. Except for 1.5 tabs on Sunday  100 tablet  3  . metoprolol tartrate (LOPRESSOR) 25 MG tablet TAKE 1 TABLET BY MOUTH TWICE A DAY  60 tablet  10  . Multiple Vitamin (MULTIVITAMIN) tablet Take 1 tablet by mouth daily.        . NON FORMULARY Focus factor 1 tab po bid       . Omega-3 Fatty Acids (FISH OIL) 1000 MG CAPS Take by mouth 1 dose over 46 hours.        Marland Kitchen SINGULAIR 10 MG tablet TAKE 1 TABLET BY MOUTH ONCE A DAY  30 tablet  6  . vitamin E 400 UNIT capsule Take 400 Units by mouth daily.         No facility-administered encounter medications on file as of 03/13/2013.    Allergies  Allergen Reactions  . Penicillins     rash    Past Medical History  Diagnosis Date  . Cardiac arrest     aborted  .  Depression   . PVC (premature ventricular contraction)     associated with cardiac arrest  . Melanoma in situ of skin of trunk     chest  . Headache(784.0)   . Low back pain syndrome   . ADD (attention deficit disorder)   . Dementia     post resusitative  . ICD (implantable cardiac defibrillator) in place   . Allergy   . Idiopathic urticaria   . Sleep apnea   . Hyperlipidemia   . GERD (gastroesophageal reflux disease)   . Tubular adenoma of colon 10/2010  . Diverticulosis   . Internal hemorrhoids   . Cancer     melanoma  . Hypothyroidism     ROS: Negative except as per HPI  BP 122/94  Pulse 79  Ht 5' 9.5" (1.765 m)  Wt 79.833 kg (176 lb)  BMI 25.63 kg/m2  SpO2 97%  PHYSICAL EXAM: Pt is alert and oriented, NAD HEENT: normal Neck: JVP - normal, carotids 2+= without bruits Lungs: CTA bilaterally CV: RRR  without murmur or gallop Abd: soft, NT, Positive BS, no hepatomegaly Ext: no C/C/E, distal pulses intact and equal Skin: warm/dry no rash  EKG:  Normal sinus rhythm 78 beats per minute, within normal limits   ASSESSMENT AND PLAN: 1. Essential hypertension. Diastolic blood pressure is mildly elevated today. We'll continue to watch for now on a combination of diltiazem and metoprolol. We reviewed his blood pressures over time and the vast majority of are within range.  2. History of VT. No recent arrhythmias. He sees Dr. [name] next week in followup.  Tonny Bollman 03/13/2013 9:28 AM

## 2013-03-13 NOTE — Patient Instructions (Signed)
Your physician wants you to follow-up in: 6 months with Dr. Cooper.  You will receive a reminder letter in the mail two months in advance. If you don't receive a letter, please call our office to schedule the follow-up appointment.  Your physician recommends that you continue on your current medications as directed. Please refer to the Current Medication list given to you today.  

## 2013-03-15 DIAGNOSIS — H524 Presbyopia: Secondary | ICD-10-CM | POA: Diagnosis not present

## 2013-03-15 DIAGNOSIS — H2510 Age-related nuclear cataract, unspecified eye: Secondary | ICD-10-CM | POA: Diagnosis not present

## 2013-03-16 ENCOUNTER — Other Ambulatory Visit: Payer: Self-pay | Admitting: Cardiovascular Disease

## 2013-03-21 ENCOUNTER — Encounter: Payer: Self-pay | Admitting: Internal Medicine

## 2013-03-21 ENCOUNTER — Ambulatory Visit (INDEPENDENT_AMBULATORY_CARE_PROVIDER_SITE_OTHER): Payer: Medicare Other | Admitting: Internal Medicine

## 2013-03-21 VITALS — BP 129/93 | HR 85 | Ht 70.0 in | Wt 176.5 lb

## 2013-03-21 DIAGNOSIS — I10 Essential (primary) hypertension: Secondary | ICD-10-CM

## 2013-03-21 DIAGNOSIS — I472 Ventricular tachycardia: Secondary | ICD-10-CM | POA: Diagnosis not present

## 2013-03-21 DIAGNOSIS — Z95810 Presence of automatic (implantable) cardiac defibrillator: Secondary | ICD-10-CM

## 2013-03-21 LAB — ICD DEVICE OBSERVATION
BRDY-0002RV: 50 {beats}/min
DEV-0020ICD: NEGATIVE
DEVICE MODEL ICD: 277857
HV IMPEDENCE: 65 Ohm
RV LEAD AMPLITUDE: 13.3 mv
RV LEAD IMPEDENCE ICD: 774 Ohm
RV LEAD THRESHOLD: 1.2 V
TZON-0003FASTVT: 324.3 ms
VENTRICULAR PACING ICD: 2 pct

## 2013-03-21 NOTE — Patient Instructions (Addendum)
Your physician recommends that you schedule a follow-up appointment in: 3 months with device clinic  Your physician recommends that you continue on your current medications as directed. Please refer to the Current Medication list given to you today.

## 2013-03-21 NOTE — Assessment & Plan Note (Signed)
reasonalby controlled

## 2013-03-21 NOTE — Assessment & Plan Note (Signed)
The patient's device was interrogated.  The information was reviewed. No changes were made in the programming.    

## 2013-03-21 NOTE — Assessment & Plan Note (Signed)
No recurrent arhythmia

## 2013-03-21 NOTE — Progress Notes (Signed)
Patient Care Team: Joaquim Nam, MD as PCP - General (Family Medicine)   HPI  Larry [name] is a 62 y.o. 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 He underwent ICD generator replacement in Dec 2011   The patient denies chest pain, shortness of breath, nocturnal dyspnea, orthopnea or peripheral edema. There have been no palpitations, lightheadedness or syncope.      Past Medical History  Diagnosis Date  . Cardiac arrest     aborted  . Depression   . PVC (premature ventricular contraction)     associated with cardiac arrest  . Melanoma in situ of skin of trunk     chest  . Headache(784.0)   . Low back pain syndrome   . ADD (attention deficit disorder)   . Dementia     post resusitative  . ICD (implantable cardiac defibrillator) in place   . Allergy   . Idiopathic urticaria   . Sleep apnea   . Hyperlipidemia   . GERD (gastroesophageal reflux disease)   . Tubular adenoma of colon 10/2010  . Diverticulosis   . Internal hemorrhoids   . Cancer     melanoma  . Hypothyroidism     Past Surgical History  Procedure Laterality Date  . Hemorrhoid surgery    .  implantation x2    . Icd    . Cardiac defibrillator removal  2004    Defibrillator change [name])  . Tonsillectomy    . Cardiac defibrillator placement  07/01/2010    Explanation of a previously implanted device, pocket revision, and insertion of a new device, and intraoperative defibrillation threshold testing [name])    Current Outpatient Prescriptions  Medication Sig Dispense Refill  . amphetamine-dextroamphetamine (ADDERALL) 10 MG tablet Take 1 tablet (10 mg total) by mouth 2 (two) times daily.  60 tablet  0  . Ascorbic Acid (VITAMIN C) 500 MG tablet Take 500 mg by mouth daily.        Marland Kitchen aspirin 81 MG tablet Take 81 mg by mouth daily.      . CRESTOR 10 MG tablet TAKE 1 TABLET BY MOUTH AT BEDTIME  30 tablet  11  .  diltiazem (CARDIZEM CD) 120 MG 24 hr capsule TAKE ONE CAPSULE BY MOUTH EVERY DAY  30 capsule  6  . donepezil (ARICEPT) 5 MG tablet Take 5 mg by mouth at bedtime.       . fexofenadine (ALLEGRA) 180 MG tablet Take 180 mg by mouth daily.        . Ginkgo Biloba 40 MG TABS Take by mouth 2 (two) times daily.        Marland Kitchen levothyroxine (SYNTHROID) 50 MCG tablet Take 1 tablet (50 mcg total) by mouth daily. Except for 1.5 tabs on Sunday  100 tablet  3  . metoprolol tartrate (LOPRESSOR) 25 MG tablet TAKE 1 TABLET BY MOUTH TWICE A DAY  60 tablet  10  . Multiple Vitamin (MULTIVITAMIN) tablet Take 1 tablet by mouth daily.        . NON FORMULARY Focus factor 1 tab po bid       . Omega-3 Fatty Acids (FISH OIL) 1000 MG CAPS Take by mouth 1 dose over 46 hours.        Marland Kitchen SINGULAIR 10 MG tablet TAKE 1 TABLET BY MOUTH ONCE A DAY  30 tablet  6  . vitamin E 400 UNIT capsule Take 400  Units by mouth daily.         No current facility-administered medications for this visit.    Allergies  Allergen Reactions  . Penicillins     rash    Review of Systems negative except from HPI and PMH  Physical Exam BP 129/93  Pulse 85  Ht 5\' 10"  (1.778 m)  Wt 176 lb 8 oz (80.06 kg)  BMI 25.33 kg/m2  Well developed and nourished in no acute distress HENT normal Neck supple with JVP-flat Clear Device pocket well healed; without hematoma or erythema.  There is no tethering  Regular rate and rhythm, no murmurs or gallops Abd-soft with active BS No Clubbing cyanosis edema Skin-warm and dry A & Oriented  Grossly normal sensory and motor function     Assessment and  Plan

## 2013-03-23 ENCOUNTER — Other Ambulatory Visit: Payer: Self-pay | Admitting: Internal Medicine

## 2013-03-23 ENCOUNTER — Other Ambulatory Visit: Payer: Self-pay

## 2013-03-23 MED ORDER — AMPHETAMINE-DEXTROAMPHETAMINE 10 MG PO TABS: 10.0000 mg | ORAL_TABLET | Freq: Two times a day (BID) | ORAL | Status: DC

## 2013-03-23 NOTE — Telephone Encounter (Signed)
Printed.  Thanks.  

## 2013-03-23 NOTE — Telephone Encounter (Signed)
Pt left v/m requesting rx adderall. Call when ready for pick up. Pt going on trip and would like to pick up prescription today.

## 2013-03-23 NOTE — Telephone Encounter (Signed)
Patient advised.  Rx left at front desk for pick up. 

## 2013-04-03 ENCOUNTER — Other Ambulatory Visit: Payer: Self-pay | Admitting: Internal Medicine

## 2013-04-10 ENCOUNTER — Other Ambulatory Visit: Payer: Self-pay | Admitting: Internal Medicine

## 2013-04-16 ENCOUNTER — Other Ambulatory Visit (INDEPENDENT_AMBULATORY_CARE_PROVIDER_SITE_OTHER): Payer: Medicare Other

## 2013-04-16 ENCOUNTER — Other Ambulatory Visit: Payer: Self-pay | Admitting: Family Medicine

## 2013-04-16 DIAGNOSIS — E039 Hypothyroidism, unspecified: Secondary | ICD-10-CM

## 2013-04-16 LAB — TSH: TSH: 6.7 u[IU]/mL — ABNORMAL HIGH (ref 0.35–5.50)

## 2013-04-16 MED ORDER — LEVOTHYROXINE SODIUM 50 MCG PO TABS: 50.0000 ug | ORAL_TABLET | Freq: Every day | ORAL | Status: DC

## 2013-04-24 ENCOUNTER — Other Ambulatory Visit: Payer: Self-pay | Admitting: *Deleted

## 2013-04-24 MED ORDER — AMPHETAMINE-DEXTROAMPHETAMINE 10 MG PO TABS: 10.0000 mg | ORAL_TABLET | Freq: Two times a day (BID) | ORAL | Status: DC

## 2013-04-24 NOTE — Telephone Encounter (Signed)
Patient advised.  Rx left at front desk for pick up. 

## 2013-04-24 NOTE — Progress Notes (Signed)
Patient advised.

## 2013-04-24 NOTE — Telephone Encounter (Signed)
Patient requests Adderall Rx to be written and call when ready for pick up.

## 2013-04-24 NOTE — Telephone Encounter (Signed)
Printed

## 2013-05-03 ENCOUNTER — Encounter: Payer: Self-pay | Admitting: Family Medicine

## 2013-05-14 DIAGNOSIS — Z85820 Personal history of malignant melanoma of skin: Secondary | ICD-10-CM | POA: Diagnosis not present

## 2013-05-14 DIAGNOSIS — D235 Other benign neoplasm of skin of trunk: Secondary | ICD-10-CM | POA: Diagnosis not present

## 2013-05-14 DIAGNOSIS — L570 Actinic keratosis: Secondary | ICD-10-CM | POA: Diagnosis not present

## 2013-05-14 DIAGNOSIS — L821 Other seborrheic keratosis: Secondary | ICD-10-CM | POA: Diagnosis not present

## 2013-05-21 ENCOUNTER — Other Ambulatory Visit: Payer: Self-pay

## 2013-05-21 MED ORDER — AMPHETAMINE-DEXTROAMPHETAMINE 10 MG PO TABS: 10.0000 mg | ORAL_TABLET | Freq: Two times a day (BID) | ORAL | Status: DC

## 2013-05-21 NOTE — Telephone Encounter (Signed)
Pt left v/m requesting rx adderall. Call when ready for pick up. 

## 2013-05-21 NOTE — Telephone Encounter (Signed)
Printed.  Thanks.  

## 2013-05-22 ENCOUNTER — Encounter: Payer: Self-pay | Admitting: Family Medicine

## 2013-05-22 NOTE — Telephone Encounter (Signed)
Patient advised.  Rx left at front desk for pick up. 

## 2013-06-04 ENCOUNTER — Ambulatory Visit (INDEPENDENT_AMBULATORY_CARE_PROVIDER_SITE_OTHER): Payer: Medicare Other | Admitting: Family Medicine

## 2013-06-04 ENCOUNTER — Encounter: Payer: Self-pay | Admitting: Family Medicine

## 2013-06-04 VITALS — BP 130/68 | HR 84 | Temp 98.0°F | Wt 180.8 lb

## 2013-06-04 DIAGNOSIS — J020 Streptococcal pharyngitis: Secondary | ICD-10-CM | POA: Diagnosis not present

## 2013-06-04 DIAGNOSIS — J069 Acute upper respiratory infection, unspecified: Secondary | ICD-10-CM | POA: Diagnosis not present

## 2013-06-04 LAB — POCT RAPID STREP A (OFFICE): Rapid Strep A Screen: NEGATIVE

## 2013-06-04 NOTE — Patient Instructions (Signed)
Drink plenty of fluids, take ibuprofen as needed with food, and gargle with warm salt water for your throat.  Rest your voice and take plain mucinex with plenty of fluids.  This should gradually improve.  Take care.  Let us know if you have other concerns.

## 2013-06-04 NOTE — Assessment & Plan Note (Signed)
Likely viral, nontoxic. Supportive care, see instructions.

## 2013-06-04 NOTE — Progress Notes (Signed)
Pre-visit discussion using our clinic review tool. No additional management support is needed unless otherwise documented below in the visit note.  Was down at [employer] for [employer].  Granddaughter was sick at the time.  His sx started about 3-4 days ago.  First noted generalized aches.  Voice is altered, lower pitch then hoarse.  Scratchy throat.  Fever likely last night.  He had a rash on his upper chest, and this is typical when he gets a cold.  The rash is better today.  It wasn't itchy.  No vomiting, no diarrhea, today with a cough, some sputum production.  j  He has tried no meds other than ibuprofen for the aches.  Gargled with mouthwash in the meantime.    His voice change is the most bothersome to him.    Meds, vitals, and allergies reviewed.   ROS: See HPI.  Otherwise, noncontributory.  GEN: nad, alert and oriented HEENT: mucous membranes moist, tm w/o erythema, nasal exam w/o erythema, clear discharge noted,  OP with cobblestoning NECK: supple w/o LA CV: rrr.   PULM: ctab, no inc wob EXT: no edema SKIN: faint blanching rash noted on the upper chest B  RST neg.

## 2013-06-14 ENCOUNTER — Telehealth: Payer: Self-pay | Admitting: Family Medicine

## 2013-06-14 MED ORDER — DOXYCYCLINE HYCLATE 100 MG PO TABS: 100.0000 mg | ORAL_TABLET | Freq: Two times a day (BID) | ORAL | Status: DC

## 2013-06-14 NOTE — Telephone Encounter (Signed)
Please get me an update on his specific symptoms.  Thanks.

## 2013-06-14 NOTE — Telephone Encounter (Signed)
Then I would start doxy.  Sent.  F/u if not improved or if SOB. Thanks.

## 2013-06-14 NOTE — Telephone Encounter (Signed)
Wife says he is coughing up dark yellow sputum, no fever, no ST but overall feels fatigued and listless.  She says this actually began on November 27th but he didn't come to the doctor until December 1 so it has now been 2 full weeks with no improvement.  She says they have company from out of town coming in next week for the holidays and a lot of stuff going on at church and he's just not himself.  Please advise.

## 2013-06-14 NOTE — Telephone Encounter (Signed)
Patient  Wife advised.

## 2013-06-14 NOTE — Telephone Encounter (Signed)
Pt's wife left voicemail with triage. Pt was seen on 06/04/13 and was advise that he had a viral inf. Pt isn't improving and has tried all the OTC medications that Dr. Para March recommended. Pt's wife request an abx sent to pharmacy, and request call back once done

## 2013-06-18 ENCOUNTER — Ambulatory Visit (INDEPENDENT_AMBULATORY_CARE_PROVIDER_SITE_OTHER): Payer: Medicare Other | Admitting: *Deleted

## 2013-06-18 ENCOUNTER — Encounter: Payer: Self-pay | Admitting: Internal Medicine

## 2013-06-18 DIAGNOSIS — I472 Ventricular tachycardia: Secondary | ICD-10-CM | POA: Diagnosis not present

## 2013-06-18 LAB — MDC_IDC_ENUM_SESS_TYPE_INCLINIC
Battery Remaining Longevity: 125 mo
Brady Statistic RV Percent Paced: 3 %
Date Time Interrogation Session: 20141215050000
HighPow Impedance: 43 Ohm
HighPow Impedance: 63 Ohm
Implantable Pulse Generator Serial Number: 277857
Lead Channel Impedance Value: 753 Ohm
Lead Channel Pacing Threshold Amplitude: 1.2 V
Lead Channel Pacing Threshold Pulse Width: 0.8 ms
Lead Channel Sensing Intrinsic Amplitude: 12.4 mV
Lead Channel Setting Pacing Amplitude: 2.4 V
Lead Channel Setting Pacing Pulse Width: 0.8 ms
Lead Channel Setting Sensing Sensitivity: 0.4 mV
Zone Setting Detection Interval: 286 ms
Zone Setting Detection Interval: 324 ms

## 2013-06-18 NOTE — Progress Notes (Signed)
ICD check in office. 

## 2013-06-19 ENCOUNTER — Other Ambulatory Visit (INDEPENDENT_AMBULATORY_CARE_PROVIDER_SITE_OTHER): Payer: Medicare Other

## 2013-06-19 DIAGNOSIS — E039 Hypothyroidism, unspecified: Secondary | ICD-10-CM

## 2013-06-19 LAB — TSH: TSH: 5.09 u[IU]/mL (ref 0.35–5.50)

## 2013-06-20 ENCOUNTER — Other Ambulatory Visit: Payer: Self-pay | Admitting: Family Medicine

## 2013-06-29 ENCOUNTER — Other Ambulatory Visit: Payer: Self-pay | Admitting: Family Medicine

## 2013-07-23 ENCOUNTER — Other Ambulatory Visit: Payer: Self-pay

## 2013-07-23 MED ORDER — AMPHETAMINE-DEXTROAMPHETAMINE 10 MG PO TABS: 10.0000 mg | ORAL_TABLET | Freq: Two times a day (BID) | ORAL | Status: DC

## 2013-07-23 NOTE — Telephone Encounter (Signed)
Pt left v/m requesting rx adderall. Call when ready for pick up. 

## 2013-07-23 NOTE — Telephone Encounter (Signed)
Printed.  Thanks.  

## 2013-07-24 NOTE — Telephone Encounter (Signed)
Patient advised.  Rx left at front desk for pick up. 

## 2013-07-27 ENCOUNTER — Telehealth: Payer: Self-pay | Admitting: *Deleted

## 2013-07-27 DIAGNOSIS — R413 Other amnesia: Secondary | ICD-10-CM

## 2013-07-30 MED ORDER — GALANTAMINE HYDROBROMIDE 4 MG PO TABS: 4.0000 mg | ORAL_TABLET | Freq: Two times a day (BID) | ORAL | Status: DC

## 2013-07-30 NOTE — Telephone Encounter (Signed)
I prescribed galantamine, 4 mg twice daily and sent to his pharmacy. When he is ready to started he has to stop taking donepezil. Please advise patient that side effects may include: lightheadedness, dizziness, tremor, slow or irregular heartbeat, blurry vision, confusion, decreased urination, lightheadedness when getting up suddenly from a lying or sitting position, dry mouth, fainting, fast, irregular, pounding, or racing heartbeat or pulse, unusual tiredness or weakness

## 2013-07-30 NOTE — Telephone Encounter (Signed)
Patient wants to know if he can switch to different medicine, that doesn't cost as much. Please advise.

## 2013-07-30 NOTE — Telephone Encounter (Signed)
I called and spoke to pt and wife about the new medication prescribed.  They will be going out of town during this time when their f/u is and they will start new medication when they return.   I cancelled there appt and I will have check out call to reschedule.

## 2013-08-06 ENCOUNTER — Ambulatory Visit: Payer: Medicare Other | Admitting: Neurology

## 2013-08-27 ENCOUNTER — Other Ambulatory Visit: Payer: Self-pay

## 2013-08-27 MED ORDER — AMPHETAMINE-DEXTROAMPHETAMINE 10 MG PO TABS: 10.0000 mg | ORAL_TABLET | Freq: Two times a day (BID) | ORAL | Status: DC

## 2013-08-27 NOTE — Telephone Encounter (Signed)
Pt request rx adderall. Call when ready for pick up. 

## 2013-08-27 NOTE — Telephone Encounter (Signed)
Printed.  Thanks.  

## 2013-08-28 NOTE — Telephone Encounter (Signed)
Patient notified that script is up front ready for pickup.

## 2013-09-10 ENCOUNTER — Telehealth: Payer: Self-pay | Admitting: Neurology

## 2013-09-10 MED ORDER — DONEPEZIL HCL 10 MG PO TABS: 10.0000 mg | ORAL_TABLET | Freq: Every day | ORAL | Status: DC

## 2013-09-10 NOTE — Telephone Encounter (Signed)
Patient needs refill for generic Airacept--[facility] [address] [place], [place]--patient completely out--patient to see Dr. Krista Blue on Friday--had previously seen Dr. Danton Sewer you.

## 2013-09-10 NOTE — Telephone Encounter (Signed)
Last phone note said the patient wished to change to Galantamine due to the cost of Aricept.  I called the patient back.  Spoke with Ms. Sebring.  She said the patient never started Galantamine and would like to continue Aricept at this time.  Says they will discuss other options if necessary at [place].  Rx has been sent

## 2013-09-12 ENCOUNTER — Encounter: Payer: Self-pay | Admitting: Cardiovascular Disease

## 2013-09-12 ENCOUNTER — Ambulatory Visit (INDEPENDENT_AMBULATORY_CARE_PROVIDER_SITE_OTHER): Payer: Medicare Other | Admitting: Cardiovascular Disease

## 2013-09-12 VITALS — BP 124/95 | HR 73 | Ht 69.0 in | Wt 183.0 lb

## 2013-09-12 DIAGNOSIS — I472 Ventricular tachycardia: Secondary | ICD-10-CM | POA: Diagnosis not present

## 2013-09-12 DIAGNOSIS — E785 Hyperlipidemia, unspecified: Secondary | ICD-10-CM | POA: Diagnosis not present

## 2013-09-12 DIAGNOSIS — I10 Essential (primary) hypertension: Secondary | ICD-10-CM

## 2013-09-12 DIAGNOSIS — I4729 Other ventricular tachycardia: Secondary | ICD-10-CM

## 2013-09-12 NOTE — Patient Instructions (Signed)
Your physician recommends that you continue on your current medications as directed. Please refer to the Current Medication list given to you today.  Your physician wants you to follow-up in: 6 months with Dr. Cooper.  You will receive a reminder letter in the mail two months in advance. If you don't receive a letter, please call our office to schedule the follow-up appointment.  

## 2013-09-12 NOTE — Progress Notes (Signed)
HPI:  63 year old gentleman presenting for followup evaluation. He has a history of remote cardiac arrest. He's had an ICD for many years and underwent a generator change in 2012. He's followed by Dr. Caryl Comes. Since I have been seeing him, he's had episodes of sinus tachycardia and he has been managed with diltiazem. He has done very well over the past few years without cardiac problems.  Lipid Panel 02/08/13:    Component Value Date/Time   CHOL 192 02/08/2013 0818   TRIG 91.0 02/08/2013 0818   HDL 52.40 02/08/2013 0818   CHOLHDL 4 02/08/2013 0818   VLDL 18.2 02/08/2013 0818   LDLCALC 121* 02/08/2013 0818   The patient is doing well. He denies chest pain, shortness of breath, or palpitations. He's been compliant with his medications. He has generalized fatigue which is long-standing. He otherwise has no complaints today.  Outpatient Encounter Prescriptions as of 09/12/2013  Medication Sig  . amphetamine-dextroamphetamine (ADDERALL) 10 MG tablet Take 1 tablet (10 mg total) by mouth 2 (two) times daily.  . Ascorbic Acid (VITAMIN C) 500 MG tablet Take 500 mg by mouth daily.    Marland Kitchen aspirin 81 MG tablet Take 81 mg by mouth daily.  . CRESTOR 10 MG tablet TAKE 1 TABLET BY MOUTH AT BEDTIME  . diltiazem (CARDIZEM CD) 120 MG 24 hr capsule TAKE ONE CAPSULE BY MOUTH EVERY DAY  . donepezil (ARICEPT) 10 MG tablet Take 1 tablet (10 mg total) by mouth daily.  . fexofenadine (ALLEGRA) 180 MG tablet Take 180 mg by mouth daily.    Marland Kitchen galantamine (RAZADYNE) 4 MG tablet Take 1 tablet (4 mg total) by mouth 2 (two) times daily with a meal.  . Ginkgo Biloba 40 MG TABS Take by mouth 2 (two) times daily.    Marland Kitchen levothyroxine (SYNTHROID) 50 MCG tablet Take 1 tablet (50 mcg total) by mouth daily. Except for 1.5 tabs on Sundays and Wednesdays  . metoprolol tartrate (LOPRESSOR) 25 MG tablet TAKE 1 TABLET BY MOUTH TWICE A DAY  . montelukast (SINGULAIR) 10 MG tablet TAKE 1 TABLET BY MOUTH ONCE A DAY  . montelukast (SINGULAIR) 10 MG  tablet TAKE 1 TABLET BY MOUTH ONCE A DAY  . Multiple Vitamin (MULTIVITAMIN) tablet Take 1 tablet by mouth daily.    . NON FORMULARY Focus factor 1 tab po bid   . Omega-3 Fatty Acids (FISH OIL) 1000 MG CAPS Take by mouth 1 dose over 46 hours.    . vitamin E 400 UNIT capsule Take 400 Units by mouth daily.    . [DISCONTINUED] doxycycline (VIBRA-TABS) 100 MG tablet Take 1 tablet (100 mg total) by mouth 2 (two) times daily.    Allergies  Allergen Reactions  . Penicillins     Difficult Breathing, rash    Past Medical History  Diagnosis Date  . Cardiac arrest     aborted  . Depression   . PVC (premature ventricular contraction)     associated with cardiac arrest  . Melanoma in situ of skin of trunk     chest  . Headache(784.0)   . Low back pain syndrome   . ADD (attention deficit disorder)   . Dementia     post resusitative  . ICD (implantable cardiac defibrillator) in place   . Allergy   . Idiopathic urticaria   . Sleep apnea   . Hyperlipidemia   . GERD (gastroesophageal reflux disease)   . Tubular adenoma of colon 10/2010  . Diverticulosis   . Internal  hemorrhoids   . Cancer     melanoma  . Hypothyroidism     BP 124/95  Pulse 73  Ht 5\' 9"  (1.753 m)  Wt 183 lb (83.008 kg)  BMI 27.01 kg/m2  PHYSICAL EXAM: Pt is alert and oriented, NAD HEENT: normal Neck: JVP - normal, carotids 2+= without bruits Lungs: CTA bilaterally CV: RRR without murmur or gallop Abd: soft, NT, Positive BS, no hepatomegaly Ext: no C/C/E, distal pulses intact and equal Skin: warm/dry no rash  EKG:  Sinus rhythm with occasional PVCs, heart rate 73 beats per minute, otherwise within normal limits.  ASSESSMENT AND PLAN: 1. Essential hypertension. On my recheck today his blood pressure is 110/82. He should continue on diltiazem and metoprolol.  2. History of VT. No recent arrhythmias. Patient is status post ICD and he is followed by Dr. Caryl Comes.  3. Hyperlipidemia. Lipids reviewed as above.  Followed by Dr. Damita Dunnings. The patient is on a statin drug. We discussed the importance of increasing his exercise and activity level.  Sherren Mocha 09/12/2013 4:23 PM

## 2013-09-13 ENCOUNTER — Ambulatory Visit (INDEPENDENT_AMBULATORY_CARE_PROVIDER_SITE_OTHER): Payer: Medicare Other | Admitting: *Deleted

## 2013-09-13 DIAGNOSIS — I4729 Other ventricular tachycardia: Secondary | ICD-10-CM | POA: Diagnosis not present

## 2013-09-13 DIAGNOSIS — I472 Ventricular tachycardia: Secondary | ICD-10-CM

## 2013-09-13 LAB — MDC_IDC_ENUM_SESS_TYPE_INCLINIC
Battery Remaining Longevity: 120 mo
Brady Statistic RV Percent Paced: 6 %
HighPow Impedance: 67 Ohm
Implantable Pulse Generator Serial Number: 277857
Lead Channel Impedance Value: 785 Ohm
Lead Channel Pacing Threshold Amplitude: 1.1 V
Lead Channel Pacing Threshold Pulse Width: 0.8 ms
Lead Channel Sensing Intrinsic Amplitude: 14.5 mV
Lead Channel Setting Pacing Amplitude: 2.4 V
Lead Channel Setting Pacing Pulse Width: 0.8 ms
Lead Channel Setting Sensing Sensitivity: 0.4 mV
Zone Setting Detection Interval: 286 ms
Zone Setting Detection Interval: 324 ms

## 2013-09-13 NOTE — Progress Notes (Signed)
ICD check in office. 

## 2013-09-14 ENCOUNTER — Encounter: Payer: Self-pay | Admitting: Neurology

## 2013-09-14 ENCOUNTER — Ambulatory Visit (INDEPENDENT_AMBULATORY_CARE_PROVIDER_SITE_OTHER): Payer: Medicare Other | Admitting: Neurology

## 2013-09-14 VITALS — BP 144/90 | HR 68 | Ht 68.25 in | Wt 183.0 lb

## 2013-09-14 DIAGNOSIS — G931 Anoxic brain damage, not elsewhere classified: Secondary | ICD-10-CM | POA: Diagnosis not present

## 2013-09-14 DIAGNOSIS — F988 Other specified behavioral and emotional disorders with onset usually occurring in childhood and adolescence: Secondary | ICD-10-CM | POA: Diagnosis not present

## 2013-09-14 DIAGNOSIS — I469 Cardiac arrest, cause unspecified: Secondary | ICD-10-CM | POA: Diagnosis not present

## 2013-09-14 DIAGNOSIS — R413 Other amnesia: Secondary | ICD-10-CM

## 2013-09-14 MED ORDER — DONEPEZIL HCL 10 MG PO TABS: 10.0000 mg | ORAL_TABLET | Freq: Every day | ORAL | Status: DC

## 2013-09-14 MED ORDER — AMPHETAMINE-DEXTROAMPHETAMINE 10 MG PO TABS: 10.0000 mg | ORAL_TABLET | Freq: Two times a day (BID) | ORAL | Status: DC

## 2013-09-14 NOTE — Progress Notes (Signed)
Subjective:    Patient ID: Larry [name] is a 63 y.o. male.  Larry [name] is a very friendly 63 year old right-handed gentleman presents for followup consultation of his memory loss in the context of anoxic brain injury.   He is accompanied by his wife today. He was a patient of Dr. Erling [name],  felt that the patient was stable but there were problems with his insurance not paying for his medications. The patient has an underlying medical history of cardiac death from ventricular fibrillation with hypoxic brain injury in Jan 1999, due to arrythmia, 15 minutes without heart beat,  status post defibrillator placement, He stayed in Hospital for 35 days, required prolonged rehabilitation, has to relearn walking again, as if that he did not have a past.  EEG showed diffuse slowing in January 1999 and CT head as well as brain MRI were normal. CT head repeat in November 2000 showed slight prominence of the lateral and third ventricles. CT head in July 2003 showed prominence of the ventricular system. Psychological testing and August 1999 showed borderline performance IQ. He became depressed. In January 2014 his MMSE was 29, clock drawing was 4, animal fluency was 10.  He went to [facility] for 2 years for patient with degenerative disorder.  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 He lives at home with his wife, still driving.   He helps the household, he still has difficulty focusing.   He has had some fluctuation in his memory per wife. He has ADD and continues to take Adderall, 10 mg twice a day, without Adderall, he could not remember  the pages that he has read,he is also taking Aricept 10 mg every day, which has been very helpful too.  He has mild unsteady gait, have a tendency of fidgety movements of his right hand, he has no seizure   His Past Medical History Is Significant For: Past Medical History  Diagnosis Date  . Cardiac  arrest     aborted  . Depression   . PVC (premature ventricular contraction)     associated with cardiac arrest  . Melanoma in situ of skin of trunk     chest  . Headache(784.0)   . Low back pain syndrome   . ADD (attention deficit disorder)   . Dementia     post resusitative  . ICD (implantable cardiac defibrillator) in place   . Allergy   . Idiopathic urticaria   . Sleep apnea   . Hyperlipidemia   . GERD (gastroesophageal reflux disease)   . Tubular adenoma of colon 10/2010  . Diverticulosis   . Internal hemorrhoids   . Cancer     melanoma  . Hypothyroidism     His Past Surgical History Is Significant For: Past Surgical History  Procedure Laterality Date  . Hemorrhoid surgery    .  implantation x2    . Icd    . Cardiac defibrillator removal  2004    Defibrillator change Larry [name])  . Tonsillectomy    . Cardiac defibrillator placement  07/01/2010    Explanation of a previously implanted device, pocket revision, and insertion of a new device, and intraoperative defibrillation threshold testing Larry [name])    His Family History Is Significant For: Family History  Problem Relation Age of Onset  . Hypertension Mother   . Heart disease Father   . Diverticulosis Father   . Coronary artery disease Father   . Alopecia  Father   . Diabetes Brother   . Prostate cancer Brother   . Lung cancer Paternal Grandfather     Smoker  . Cancer Paternal Grandfather     lung cancer - smoker  . Colon cancer Neg Hx     His Social History Is Significant For: History   Social History  . Marital Status: Married    Spouse Name: Larry [name]    Number of Children: 2  . Years of Education: College   Occupational History  .     Social History Main Topics  . Smoking status: Former Smoker -- 1.00 packs/day for 10 years    Quit date: 07/05/1992  . Smokeless tobacco: Former Systems developer    Types: Chew  . Alcohol Use: Yes     Comment: socially  . Drug Use: No  . Sexual Activity: None   Other Topics  Concern  . None   Social History Narrative   Patient is Married Larry [name])  [phone] with 2 daughters   5 grandchildren   Daily caffeine use - one cup of coffee and one- two coke per day   Patient is right-handed.   Patient has a college education.    His Allergies Are:  Allergies  Allergen Reactions  . Penicillins     Difficult Breathing, rash  :   His Current Medications Are:  Outpatient Encounter Prescriptions as of 09/14/2013  Medication Sig  . amphetamine-dextroamphetamine (ADDERALL) 10 MG tablet Take 1 tablet (10 mg total) by mouth 2 (two) times daily.  . Ascorbic Acid (VITAMIN C) 500 MG tablet Take 500 mg by mouth daily.    Marland Kitchen aspirin 81 MG tablet Take 81 mg by mouth daily.  . CRESTOR 10 MG tablet TAKE 1 TABLET BY MOUTH AT BEDTIME  . diltiazem (CARDIZEM CD) 120 MG 24 hr capsule TAKE ONE CAPSULE BY MOUTH EVERY DAY  . donepezil (ARICEPT) 10 MG tablet Take 1 tablet (10 mg total) by mouth daily.  . fexofenadine (ALLEGRA) 180 MG tablet Take 180 mg by mouth daily.    . Ginkgo Biloba 40 MG TABS Take by mouth 2 (two) times daily.    Marland Kitchen levothyroxine (SYNTHROID) 50 MCG tablet Take 1 tablet (50 mcg total) by mouth daily. Except for 1.5 tabs on Sundays and Wednesdays  . metoprolol tartrate (LOPRESSOR) 25 MG tablet TAKE 1 TABLET BY MOUTH TWICE A DAY  . montelukast (SINGULAIR) 10 MG tablet TAKE 1 TABLET BY MOUTH ONCE A DAY  . Multiple Vitamin (MULTIVITAMIN) tablet Take 1 tablet by mouth daily.    . NON FORMULARY Focus factor 1 tab po bid   . Omega-3 Fatty Acids (FISH OIL) 1000 MG CAPS Take by mouth 1 dose over 46 hours.    . vitamin E 400 UNIT capsule Take 400 Units by mouth daily.    Marland Kitchen galantamine (RAZADYNE) 4 MG tablet Take 1 tablet (4 mg total) by mouth 2 (two) times daily with a meal.  : Review of Systems  Neurological:       Memory loss Confusion  Psychiatric/Behavioral: Positive for sleep disturbance (snoring).    Objective:  Neurologic Exam  Physical Exam    Physical  Examination:   Filed Vitals:   09/14/13 1341  BP: 144/90  [name]: 68    PHYSICAL EXAMINATOINS:  Generalized: In no acute distress  Neck: Supple, no carotid bruits   Cardiac: Regular rate rhythm  Pulmonary: Clear to auscultation bilaterally  Musculoskeletal: No deformity  Neurological examination  Mentation:  slow speech Mini-Mental Status  Examination is 28 out of 30, he missed 2 out of 3 recalls , animal naming was 18 one minute.  Cranial nerve II-XII: Pupils were equal round reactive to light extraocular movements were full, visual field were full on confrontational test.  Bilateral fundi were sharp  Facial sensation and strength were normal. hearing was intact to finger rubbing bilaterally. Uvula tongue midline.  head turning and shoulder shrug and were normal and symmetric.Tongue protrusion into cheek strength was normal.  Motor: Moderate upper and lower extremity rigidity,  Sensory: Intact to fine touch, pinprick, preserved vibratory sensation, and proprioception at toes.  Coordination: Normal finger to nose, heel-to-shin bilaterally there was no truncal ataxia  Gait: Slowed stiff cautious gait  Romberg signs: Negative  Deep tendon reflexes: Brachioradialis 2/2, biceps 2/2, triceps 2/2, patellar 2/2, Achilles 2/2, plantar responses were flexor bilaterally.     Assessment and Plan:   63 years old Caucasian male, with past medical history of hypoxic brain injury, now with persistent mild memory trouble, gait difficulty, attention deficit disorder  1, continue Aricept 10 mg every day 2. Adderall 10 mg twice a day 3, return to clinic in 6 months

## 2013-10-02 ENCOUNTER — Other Ambulatory Visit: Payer: Self-pay

## 2013-10-02 MED ORDER — AMPHETAMINE-DEXTROAMPHETAMINE 10 MG PO TABS: 10.0000 mg | ORAL_TABLET | Freq: Two times a day (BID) | ORAL | Status: DC

## 2013-10-02 NOTE — Telephone Encounter (Signed)
Okay with me if he turns the other rx in.  Printed.  Routed to Dr. Krista Blue as a FYI.

## 2013-10-02 NOTE — Telephone Encounter (Signed)
Patient advised.  Rx left at front desk for pick up. 

## 2013-10-02 NOTE — Telephone Encounter (Signed)
Pt request rx adderall. Call when ready for pick up. Dr Krista Blue wrote rx but pts Adderall on 09/14/13 but has not gotten that rx filled; more convenient for pt to come to Dr Josefine Class office to pick up rx and does not want to change normal procedure for getting Adderall; will bring Dr Rhea Belton rx to our office when pick up new rx.

## 2013-10-09 ENCOUNTER — Encounter: Payer: Self-pay | Admitting: Internal Medicine

## 2013-10-15 ENCOUNTER — Other Ambulatory Visit: Payer: Self-pay | Admitting: Cardiovascular Disease

## 2013-11-06 ENCOUNTER — Telehealth: Payer: Self-pay | Admitting: Family Medicine

## 2013-11-06 ENCOUNTER — Other Ambulatory Visit: Payer: Self-pay | Admitting: Internal Medicine

## 2013-11-06 MED ORDER — AMPHETAMINE-DEXTROAMPHETAMINE 10 MG PO TABS: 10.0000 mg | ORAL_TABLET | Freq: Two times a day (BID) | ORAL | Status: DC

## 2013-11-06 NOTE — Telephone Encounter (Signed)
Patient advised.  Rx left at front desk for pick up. 

## 2013-11-06 NOTE — Telephone Encounter (Signed)
Done. Thanks.

## 2013-11-06 NOTE — Telephone Encounter (Signed)
Pt called and would like refill on amphetamine-dextroamphetamine (ADDERALL) 10 MG tablet [[id number]].  Please call when ready for pickup [phone] / lt

## 2013-11-15 ENCOUNTER — Telehealth: Payer: Self-pay | Admitting: Cardiovascular Disease

## 2013-11-15 NOTE — Telephone Encounter (Signed)
Walk in pt Form " Sealed Envelope" gave to Lauren  °

## 2013-12-05 ENCOUNTER — Other Ambulatory Visit: Payer: Self-pay

## 2013-12-05 MED ORDER — AMPHETAMINE-DEXTROAMPHETAMINE 10 MG PO TABS: 10.0000 mg | ORAL_TABLET | Freq: Two times a day (BID) | ORAL | Status: DC

## 2013-12-05 NOTE — Telephone Encounter (Signed)
Printed. Thanks. Will be due for f/u medicare CPE this fall.  Please schedule.

## 2013-12-05 NOTE — Telephone Encounter (Signed)
Spoke with patient and advised rx ready for pick-up and it will be at the front desk.  

## 2013-12-05 NOTE — Telephone Encounter (Signed)
Pt left v/m requesting rx for Adderall. Call when ready for pick up.  

## 2013-12-12 ENCOUNTER — Telehealth: Payer: Self-pay

## 2013-12-12 NOTE — Telephone Encounter (Signed)
Pt called to ck on status of adderall rx. Spoke with toni at [employer] and adderall was approved # [id number] and rx did go thru. Pt advised.

## 2013-12-12 NOTE — Telephone Encounter (Signed)
Received fax stating that pt was approved for Rx through 12/11/2016

## 2013-12-13 ENCOUNTER — Other Ambulatory Visit: Payer: Self-pay | Admitting: Internal Medicine

## 2013-12-13 ENCOUNTER — Other Ambulatory Visit: Payer: Self-pay | Admitting: *Deleted

## 2013-12-13 NOTE — Telephone Encounter (Signed)
PA approved for Amphetamine-Dextroamphetamine from 12/11/2013 through 12/11/2016. Approval letter scanned.

## 2013-12-14 ENCOUNTER — Other Ambulatory Visit: Payer: Self-pay | Admitting: Internal Medicine

## 2013-12-14 ENCOUNTER — Telehealth: Payer: Self-pay | Admitting: Cardiovascular Disease

## 2013-12-14 NOTE — Telephone Encounter (Signed)
[facility] Statement form completed, faxed, scanned and mailed orginial to [place] as requested. rmf

## 2013-12-18 ENCOUNTER — Ambulatory Visit (INDEPENDENT_AMBULATORY_CARE_PROVIDER_SITE_OTHER): Payer: Medicare Other | Admitting: *Deleted

## 2013-12-18 DIAGNOSIS — I4729 Other ventricular tachycardia: Secondary | ICD-10-CM | POA: Diagnosis not present

## 2013-12-18 DIAGNOSIS — I472 Ventricular tachycardia: Secondary | ICD-10-CM | POA: Diagnosis not present

## 2013-12-18 LAB — MDC_IDC_ENUM_SESS_TYPE_INCLINIC
Brady Statistic RV Percent Paced: 5 %
Date Time Interrogation Session: 20150616040000
HighPow Impedance: 43 Ohm
HighPow Impedance: 63 Ohm
Implantable Pulse Generator Serial Number: 277857
Lead Channel Impedance Value: 757 Ohm
Lead Channel Pacing Threshold Amplitude: 1 V
Lead Channel Pacing Threshold Pulse Width: 0.8 ms
Lead Channel Sensing Intrinsic Amplitude: 14.1 mV
Lead Channel Setting Pacing Amplitude: 2.4 V
Lead Channel Setting Pacing Pulse Width: 0.8 ms
Lead Channel Setting Sensing Sensitivity: 0.4 mV
Zone Setting Detection Interval: 286 ms
Zone Setting Detection Interval: 324 ms

## 2013-12-18 NOTE — Progress Notes (Signed)
ICD check in office. 

## 2014-01-08 ENCOUNTER — Other Ambulatory Visit: Payer: Self-pay | Admitting: Internal Medicine

## 2014-01-09 ENCOUNTER — Other Ambulatory Visit: Payer: Self-pay | Admitting: Internal Medicine

## 2014-01-09 ENCOUNTER — Other Ambulatory Visit: Payer: Self-pay

## 2014-01-09 MED ORDER — AMPHETAMINE-DEXTROAMPHETAMINE 10 MG PO TABS: 10.0000 mg | ORAL_TABLET | Freq: Two times a day (BID) | ORAL | Status: DC

## 2014-01-09 NOTE — Telephone Encounter (Signed)
Pt left v/m requesting rx for Adderall. Call when ready for pick up.  

## 2014-01-09 NOTE — Telephone Encounter (Signed)
Printed.  Thanks.  

## 2014-01-09 NOTE — Telephone Encounter (Signed)
Pt came by office and picked up adderall rx.

## 2014-01-16 ENCOUNTER — Encounter: Payer: Self-pay | Admitting: Internal Medicine

## 2014-02-07 ENCOUNTER — Other Ambulatory Visit: Payer: Self-pay

## 2014-02-07 NOTE — Telephone Encounter (Signed)
Pt left v/m requesting rx for Adderall. Call when ready for pick up. Pt has CPX scheduled on 03/07/14.

## 2014-02-08 MED ORDER — AMPHETAMINE-DEXTROAMPHETAMINE 10 MG PO TABS: 10.0000 mg | ORAL_TABLET | Freq: Two times a day (BID) | ORAL | Status: DC

## 2014-02-08 NOTE — Telephone Encounter (Signed)
Printed, thanks

## 2014-02-08 NOTE — Telephone Encounter (Signed)
Patient advised.  Rx left at front desk for pick up. 

## 2014-03-02 ENCOUNTER — Other Ambulatory Visit: Payer: Self-pay | Admitting: Cardiovascular Disease

## 2014-03-02 ENCOUNTER — Other Ambulatory Visit: Payer: Self-pay | Admitting: Family Medicine

## 2014-03-04 ENCOUNTER — Other Ambulatory Visit: Payer: Self-pay | Admitting: Family Medicine

## 2014-03-04 DIAGNOSIS — E785 Hyperlipidemia, unspecified: Secondary | ICD-10-CM

## 2014-03-04 DIAGNOSIS — I10 Essential (primary) hypertension: Secondary | ICD-10-CM

## 2014-03-04 DIAGNOSIS — E039 Hypothyroidism, unspecified: Secondary | ICD-10-CM

## 2014-03-04 DIAGNOSIS — Z125 Encounter for screening for malignant neoplasm of prostate: Secondary | ICD-10-CM

## 2014-03-05 ENCOUNTER — Other Ambulatory Visit (INDEPENDENT_AMBULATORY_CARE_PROVIDER_SITE_OTHER): Payer: Medicare Other

## 2014-03-05 DIAGNOSIS — I10 Essential (primary) hypertension: Secondary | ICD-10-CM

## 2014-03-05 DIAGNOSIS — E785 Hyperlipidemia, unspecified: Secondary | ICD-10-CM

## 2014-03-05 DIAGNOSIS — E039 Hypothyroidism, unspecified: Secondary | ICD-10-CM | POA: Diagnosis not present

## 2014-03-05 DIAGNOSIS — R7989 Other specified abnormal findings of blood chemistry: Secondary | ICD-10-CM | POA: Diagnosis not present

## 2014-03-05 DIAGNOSIS — Z125 Encounter for screening for malignant neoplasm of prostate: Secondary | ICD-10-CM

## 2014-03-05 LAB — TSH: TSH: 5.88 u[IU]/mL — ABNORMAL HIGH (ref 0.35–4.50)

## 2014-03-05 LAB — BASIC METABOLIC PANEL
BUN: 11 mg/dL (ref 6–23)
CO2: 28 mEq/L (ref 19–32)
Calcium: 9.5 mg/dL (ref 8.4–10.5)
Chloride: 102 mEq/L (ref 96–112)
Creatinine, Ser: 1 mg/dL (ref 0.4–1.5)
GFR: 84.46 mL/min (ref >60.00)
Glucose, Bld: 94 mg/dL (ref 70–99)
Potassium: 4.1 mEq/L (ref 3.5–5.1)
Sodium: 138 mEq/L (ref 135–145)

## 2014-03-05 LAB — LIPID PANEL
Cholesterol: 192 mg/dL (ref 0–200)
HDL: 50 mg/dL (ref >39.00)
NonHDL: 142
Total CHOL/HDL Ratio: 4
Triglycerides: 214 mg/dL — ABNORMAL HIGH (ref 0.0–149.0)
VLDL: 42.8 mg/dL — ABNORMAL HIGH (ref 0.0–40.0)

## 2014-03-05 LAB — PSA, MEDICARE: PSA: 0.24 ng/ml (ref 0.10–4.00)

## 2014-03-05 LAB — LDL CHOLESTEROL, DIRECT: Direct LDL: 127.6 mg/dL

## 2014-03-07 ENCOUNTER — Encounter: Payer: Self-pay | Admitting: Family Medicine

## 2014-03-07 ENCOUNTER — Ambulatory Visit (INDEPENDENT_AMBULATORY_CARE_PROVIDER_SITE_OTHER): Payer: Medicare Other | Admitting: Family Medicine

## 2014-03-07 VITALS — BP 132/92 | HR 81 | Temp 98.1°F | Ht 69.5 in | Wt 184.5 lb

## 2014-03-07 DIAGNOSIS — Z0000 Encounter for general adult medical examination without abnormal findings: Secondary | ICD-10-CM

## 2014-03-07 DIAGNOSIS — E785 Hyperlipidemia, unspecified: Secondary | ICD-10-CM | POA: Diagnosis not present

## 2014-03-07 DIAGNOSIS — E039 Hypothyroidism, unspecified: Secondary | ICD-10-CM | POA: Diagnosis not present

## 2014-03-07 DIAGNOSIS — R413 Other amnesia: Secondary | ICD-10-CM

## 2014-03-07 DIAGNOSIS — Z7189 Other specified counseling: Secondary | ICD-10-CM

## 2014-03-07 MED ORDER — AMPHETAMINE-DEXTROAMPHETAMINE 10 MG PO TABS: 10.0000 mg | ORAL_TABLET | Freq: Two times a day (BID) | ORAL | Status: DC

## 2014-03-07 NOTE — Patient Instructions (Addendum)
Check with your insurance to see if they will cover the shingles shot- see if it is cheaper at the pharmacy or at the clinic.  Flu shot done today.  Go back to taking 1 thyroid pill a day except for 1.5 tabs on Sundays and Wednesdays.  Try to get more exercise.   Take care. Glad to see you.

## 2014-03-07 NOTE — Progress Notes (Signed)
Pre visit review using our clinic review tool, if applicable. No additional management support is needed unless otherwise documented below in the visit note.  I have personally reviewed the Medicare Annual Wellness questionnaire and have noted 1. The patient's medical and social history 2. Their use of alcohol, tobacco or illicit drugs 3. Their current medications and supplements 4. The patient's functional ability including ADL's, fall risks, home safety risks and hearing or visual             impairment. 5. Diet and physical activities 6. Evidence for depression or mood disorders  The patients weight, height, BMI have been recorded in the chart and visual acuity is per eye clinic.  I have made referrals, counseling and provided education to the patient based review of the above and I have provided the pt with a written personalized care plan for preventive services.  Provider list updated- see scanned forms.  Routine anticipatory guidance given to patient.  See health maintenance.  Flu 2015 Shingles d/w pt. PNA not yet due Tetanus 2010 Colonoscopy 2012 Prostate cancer screening- psa wnl 2015 Advance directive d/w pt. wife designated if patient were incapacitated. Cognitive function addressed- see scanned forms- and if abnormal then additional documentation follows.    Elevated Cholesterol: Using medications without problems:yes Muscle aches: no Diet compliance:yes Exercise: needs more exercise, d/w pt.  Hypothyroidism.  TSH slightly up.  Patient had accidentally cut back to 7.5 tabs per week, not 8 tabs per week.  Labs d/w pt. He'll resume prev dose.  No neck mass or dysphagia.   Memory loss. He has noted some loss/slowing in the last 6 months.  Compensating with lists, working to maintain attention. Brief memory testing today all normal.  Compliant with meds. He has had f/u with neuro this year.  PMH and SH reviewed  Meds, vitals, and allergies reviewed.   ROS: See HPI.   Otherwise negative.    GEN: nad, alert and oriented HEENT: mucous membranes moist NECK: supple w/o LA CV: rrr. PULM: ctab, no inc wob ABD: soft, +bs EXT: no edema SKIN: no acute rash

## 2014-03-08 DIAGNOSIS — Z7189 Other specified counseling: Secondary | ICD-10-CM | POA: Insufficient documentation

## 2014-03-08 NOTE — Assessment & Plan Note (Signed)
TGs up, he'll work on getting more exercise.  That would be likely more useful than med change.  Labs d/w pt.  He agrees.

## 2014-03-08 NOTE — Assessment & Plan Note (Signed)
TSH slightly up.  Patient had accidentally cut back to 7.5 tabs per week, not 8 tabs per week.  Labs d/w pt. He'll resume prev dose.  No neck mass or dysphagia.

## 2014-03-08 NOTE — Assessment & Plan Note (Signed)
He has noted some loss/slowing in the last 6 months.  Compensating with lists, working to maintain attention. Brief memory testing today all normal.  Compliant with meds. He has had f/u with neuro this year.  No change in meds.  Discussed with patient.

## 2014-03-08 NOTE — Assessment & Plan Note (Signed)
Flu 2015 Shingles d/w pt. PNA not yet due Tetanus 2010 Colonoscopy 2012 Prostate cancer screening- psa wnl 2015 Advance directive d/w pt. wife designated if patient were incapacitated. Cognitive function addressed- see scanned forms- and if abnormal then additional documentation follows.

## 2014-03-12 ENCOUNTER — Other Ambulatory Visit: Payer: Self-pay | Admitting: Internal Medicine

## 2014-03-12 ENCOUNTER — Encounter: Payer: Medicare Other | Admitting: Internal Medicine

## 2014-03-12 ENCOUNTER — Encounter: Payer: Self-pay | Admitting: Internal Medicine

## 2014-03-12 ENCOUNTER — Ambulatory Visit (INDEPENDENT_AMBULATORY_CARE_PROVIDER_SITE_OTHER): Payer: Medicare Other | Admitting: Internal Medicine

## 2014-03-12 ENCOUNTER — Ambulatory Visit: Payer: Medicare Other | Admitting: Cardiovascular Disease

## 2014-03-12 VITALS — BP 130/98 | HR 68 | Ht 69.0 in | Wt 183.2 lb

## 2014-03-12 DIAGNOSIS — I4729 Other ventricular tachycardia: Secondary | ICD-10-CM

## 2014-03-12 DIAGNOSIS — I472 Ventricular tachycardia: Secondary | ICD-10-CM

## 2014-03-12 LAB — MDC_IDC_ENUM_SESS_TYPE_INCLINIC
Battery Remaining Longevity: 113 mo
Brady Statistic RV Percent Paced: 4 %
Date Time Interrogation Session: 20150908040000
HighPow Impedance: 43 Ohm
HighPow Impedance: 62 Ohm
Implantable Pulse Generator Serial Number: 277857
Lead Channel Impedance Value: 747 Ohm
Lead Channel Pacing Threshold Amplitude: 1.2 V
Lead Channel Pacing Threshold Pulse Width: 0.8 ms
Lead Channel Sensing Intrinsic Amplitude: 12.8 mV
Lead Channel Setting Pacing Amplitude: 2.4 V
Lead Channel Setting Pacing Pulse Width: 0.8 ms
Lead Channel Setting Sensing Sensitivity: 0.4 mV
Zone Setting Detection Interval: 286 ms
Zone Setting Detection Interval: 324 ms

## 2014-03-12 NOTE — Progress Notes (Signed)
Patient Care Team: [name], MD as PCP - General (Family Medicine)   HPI  Larry [name] is a 63 y.o. 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 He underwent ICD generator replacement in Dec 2011   The patient denies chest pain, shortness of breath, nocturnal dyspnea, orthopnea or peripheral edema. There have been no palpitations, lightheadedness or syncope.      Past Medical History  Diagnosis Date  . Cardiac arrest     aborted  . Depression   . PVC (premature ventricular contraction)     associated with cardiac arrest  . Melanoma in situ of skin of trunk     chest  . Headache(784.0)   . Low back pain syndrome   . ADD (attention deficit disorder)   . Dementia     post resusitative  . ICD (implantable cardiac defibrillator) in place   . Allergy   . Idiopathic urticaria   . Sleep apnea   . Hyperlipidemia   . GERD (gastroesophageal reflux disease)   . Tubular adenoma of colon 10/2010  . Diverticulosis   . Internal hemorrhoids   . Cancer     melanoma  . Hypothyroidism     Past Surgical History  Procedure Laterality Date  . Hemorrhoid surgery    .  implantation x2    . Icd    . Cardiac defibrillator removal  2004    Defibrillator change Caryl Comes)  . Tonsillectomy    . Cardiac defibrillator placement  07/01/2010    Explanation of a previously implanted device, pocket revision, and insertion of a new device, and intraoperative defibrillation threshold testing Caryl Comes)    Current Outpatient Prescriptions  Medication Sig Dispense Refill  . amphetamine-dextroamphetamine (ADDERALL) 10 MG tablet Take 1 tablet (10 mg total) by mouth 2 (two) times daily.  65 tablet  0  . Ascorbic Acid (VITAMIN C) 500 MG tablet Take 500 mg by mouth daily.        Marland Kitchen aspirin 81 MG tablet Take 81 mg by mouth daily.      . CRESTOR 10 MG tablet TAKE 1 TABLET BY MOUTH AT BEDTIME  30 tablet  0  .  diltiazem (CARDIZEM CD) 120 MG 24 hr capsule TAKE ONE CAPSULE BY MOUTH EVERY DAY  30 capsule  5  . donepezil (ARICEPT) 10 MG tablet Take 1 tablet (10 mg total) by mouth daily.  30 tablet  12  . fexofenadine (ALLEGRA) 180 MG tablet Take 180 mg by mouth daily.        . Ginkgo Biloba 40 MG TABS Take by mouth 2 (two) times daily.        Marland Kitchen levothyroxine (SYNTHROID, LEVOTHROID) 50 MCG tablet TAKE 1 TABLET (50 MCG TOTAL) BY MOUTH DAILY. EXCEPT FOR 1.5 TABS ON SUNDAYS and WEDNESDAYS      . metoprolol tartrate (LOPRESSOR) 25 MG tablet TAKE 1 TABLET BY MOUTH TWICE A DAY  60 tablet  9  . montelukast (SINGULAIR) 10 MG tablet TAKE 1 TABLET BY MOUTH ONCE A DAY  30 tablet  5  . Multiple Vitamin (MULTIVITAMIN) tablet Take 1 tablet by mouth daily.        . NON FORMULARY Focus factor 1 tab po bid       . Omega-3 Fatty Acids (FISH OIL) 1000 MG CAPS Take by mouth 1 dose over 46 hours.        . vitamin E  400 UNIT capsule Take 400 Units by mouth daily.         No current facility-administered medications for this visit.    Allergies  Allergen Reactions  . Penicillins     Difficult Breathing, rash    Review of Systems negative except from HPI and PMH  Physical Exam BP 130/98  Pulse 68  Ht 5\' 9"  (1.753 m)  Wt 183 lb 4 oz (83.122 kg)  BMI 27.05 kg/m2  Well developed and nourished in no acute distress HENT normal Neck supple with JVP-flat Clear Device pocket well healed; without hematoma or erythema.  There is no tethering  Regular rate and rhythm, no murmurs or gallops Abd-soft with active BS No Clubbing cyanosis edema Skin-warm and dry A & Oriented  Grossly normal sensory and motor function     Assessment and  Plan Aborted Cardiac Arrest  Hypertension  Well controlled  On recheck Sys <125  Implantable Defibrillator  The patient's device was interrogated.  The information was reviewed. No changes were made in the programming.

## 2014-03-12 NOTE — Patient Instructions (Signed)
Your physician wants you to follow-up in: 1 year with Dr. Caryl Comes. You will receive a reminder letter in the mail two months in advance. If you don't receive a letter, please call our office to schedule the follow-up appointment.  Your physician recommends that you schedule a follow-up appointment in:  [facility] in December

## 2014-03-20 ENCOUNTER — Encounter: Payer: Self-pay | Admitting: Cardiovascular Disease

## 2014-03-20 ENCOUNTER — Ambulatory Visit (INDEPENDENT_AMBULATORY_CARE_PROVIDER_SITE_OTHER): Payer: Medicare Other | Admitting: Cardiovascular Disease

## 2014-03-20 VITALS — BP 126/76 | HR 65 | Ht 69.0 in | Wt 185.0 lb

## 2014-03-20 DIAGNOSIS — E785 Hyperlipidemia, unspecified: Secondary | ICD-10-CM | POA: Diagnosis not present

## 2014-03-20 DIAGNOSIS — I10 Essential (primary) hypertension: Secondary | ICD-10-CM

## 2014-03-20 NOTE — Progress Notes (Signed)
HPI:   63 year old gentleman presenting for followup evaluation. He has a history of remote cardiac arrest. He's had an ICD for many years and underwent a generator change in 2012. He's followed by Dr. Caryl Comes.   The patient continues to do well. He denies chest pain or shortness of breath. He's done some yard work, but is not engaged in regular exercise. He has no exertional symptoms.  Outpatient Encounter Prescriptions as of 03/20/2014  Medication Sig  . amphetamine-dextroamphetamine (ADDERALL) 10 MG tablet Take 1 tablet (10 mg total) by mouth 2 (two) times daily.  . Ascorbic Acid (VITAMIN C) 500 MG tablet Take 500 mg by mouth daily.    Marland Kitchen aspirin 81 MG tablet Take 81 mg by mouth daily.  . CRESTOR 10 MG tablet TAKE 1 TABLET BY MOUTH AT BEDTIME  . diltiazem (CARDIZEM CD) 120 MG 24 hr capsule TAKE ONE CAPSULE BY MOUTH EVERY DAY  . donepezil (ARICEPT) 10 MG tablet Take 1 tablet (10 mg total) by mouth daily.  . fexofenadine (ALLEGRA) 180 MG tablet Take 180 mg by mouth daily.    . Ginkgo Biloba 40 MG TABS Take by mouth 2 (two) times daily.    Marland Kitchen levothyroxine (SYNTHROID, LEVOTHROID) 50 MCG tablet TAKE 1 TABLET (50 MCG TOTAL) BY MOUTH DAILY. EXCEPT FOR 1.5 TABS ON SUNDAYS and WEDNESDAYS  . metoprolol tartrate (LOPRESSOR) 25 MG tablet TAKE 1 TABLET BY MOUTH TWICE A DAY  . montelukast (SINGULAIR) 10 MG tablet TAKE 1 TABLET BY MOUTH ONCE A DAY  . Multiple Vitamin (MULTIVITAMIN) tablet Take 1 tablet by mouth daily.    . NON FORMULARY Focus factor 1 tab po bid   . Omega-3 Fatty Acids (FISH OIL) 1000 MG CAPS Take by mouth 1 dose over 46 hours.    . vitamin E 400 UNIT capsule Take 400 Units by mouth daily.      Allergies  Allergen Reactions  . Penicillins     Difficult Breathing, rash    Past Medical History  Diagnosis Date  . Cardiac arrest     aborted  . Depression   . PVC (premature ventricular contraction)     associated with cardiac arrest  . Melanoma in situ of skin of trunk    chest  . Headache(784.0)   . Low back pain syndrome   . ADD (attention deficit disorder)   . Dementia     post resusitative  . ICD (implantable cardiac defibrillator) in place   . Allergy   . Idiopathic urticaria   . Sleep apnea   . Hyperlipidemia   . GERD (gastroesophageal reflux disease)   . Tubular adenoma of colon 10/2010  . Diverticulosis   . Internal hemorrhoids   . Cancer     melanoma  . Hypothyroidism     ROS: Negative except as per HPI  BP 126/76  Pulse 65  Ht 5\' 9"  (1.753 m)  Wt 185 lb (83.915 kg)  BMI 27.31 kg/m2  PHYSICAL EXAM: Pt is alert and oriented, NAD HEENT: normal Neck: JVP - normal, carotids 2+= without bruits Lungs: CTA bilaterally CV: RRR without murmur or gallop Abd: soft, NT, Positive BS, no hepatomegaly Ext: no C/C/E, distal pulses intact and equal Skin: warm/dry no rash  EKG:  Normal sinus rhythm 65 beats per minute, within normal limits.  ASSESSMENT AND PLAN: 1. Essential hypertension. Blood pressure is well controlled. His heart rates are also much better controlled on a combination of diltiazem and metoprolol. No changes were made today.  2. History of VT. No recent arrhythmias. Patient is status post ICD and he is followed by Dr. Caryl Comes.   3. Hyperlipidemia. Lipids are not as well controlled as in the past. He continues on Crestor. Labs are followed by Dr. Damita Dunnings. I encouraged him to work on weight loss and exercise with a goal of 175 pounds when he in 6 months. He's gained about 10 pounds over the last few years.  Sherren Mocha MD 03/20/2014 4:24 PM

## 2014-03-20 NOTE — Patient Instructions (Signed)
Your physician wants you to follow-up in: 6 MONTHS with Dr Burt Knack.  You will receive a reminder letter in the mail two months in advance. If you don't receive a letter, please call our office to schedule the follow-up appointment.  Your physician recommends that you continue on your current medications as directed. Please refer to the Current Medication list given to you today.  Your physician encouraged you to lose weight for better health (goal is less than 175 lbs)

## 2014-03-22 ENCOUNTER — Other Ambulatory Visit: Payer: Self-pay | Admitting: *Deleted

## 2014-03-22 MED ORDER — METOPROLOL TARTRATE 25 MG PO TABS: ORAL_TABLET | ORAL | Status: DC

## 2014-03-22 MED ORDER — DILTIAZEM HCL ER COATED BEADS 120 MG PO CP24: ORAL_CAPSULE | ORAL | Status: DC

## 2014-03-22 MED ORDER — ROSUVASTATIN CALCIUM 10 MG PO TABS: ORAL_TABLET | ORAL | Status: DC

## 2014-03-23 ENCOUNTER — Other Ambulatory Visit: Payer: Self-pay | Admitting: Cardiovascular Disease

## 2014-03-26 ENCOUNTER — Other Ambulatory Visit: Payer: Self-pay | Admitting: Family Medicine

## 2014-03-26 ENCOUNTER — Other Ambulatory Visit: Payer: Self-pay | Admitting: *Deleted

## 2014-03-26 DIAGNOSIS — R413 Other amnesia: Secondary | ICD-10-CM

## 2014-03-26 MED ORDER — MONTELUKAST SODIUM 10 MG PO TABS: ORAL_TABLET | ORAL | Status: DC

## 2014-03-26 MED ORDER — DONEPEZIL HCL 10 MG PO TABS: 10.0000 mg | ORAL_TABLET | Freq: Every day | ORAL | Status: DC

## 2014-03-26 MED ORDER — LEVOTHYROXINE SODIUM 50 MCG PO TABS: ORAL_TABLET | ORAL | Status: DC

## 2014-04-18 ENCOUNTER — Other Ambulatory Visit: Payer: Self-pay

## 2014-04-18 NOTE — Telephone Encounter (Signed)
Pt left v/m requesting rx for Adderall. Call when ready for pick up. Pt wants to pick up rx ASAP.

## 2014-04-19 ENCOUNTER — Encounter: Payer: Self-pay | Admitting: Family Medicine

## 2014-04-19 ENCOUNTER — Other Ambulatory Visit: Payer: Self-pay

## 2014-04-19 DIAGNOSIS — Z79899 Other long term (current) drug therapy: Secondary | ICD-10-CM | POA: Diagnosis not present

## 2014-04-19 MED ORDER — AMPHETAMINE-DEXTROAMPHETAMINE 10 MG PO TABS: 10.0000 mg | ORAL_TABLET | Freq: Two times a day (BID) | ORAL | Status: DC

## 2014-04-19 NOTE — Telephone Encounter (Signed)
Spoke top pt and informed him Rx is available for pickup. Pt advised third party unable to pickup Rx

## 2014-04-19 NOTE — Telephone Encounter (Signed)
Printed.  Thanks.  

## 2014-04-24 ENCOUNTER — Telehealth: Payer: Self-pay | Admitting: Cardiovascular Disease

## 2014-04-24 NOTE — Telephone Encounter (Signed)
Walk In pt Form " Sealed Envelope" Dropped Off gave to Ander Purpura

## 2014-04-30 ENCOUNTER — Encounter: Payer: Self-pay | Admitting: Family Medicine

## 2014-05-03 ENCOUNTER — Telehealth: Payer: Self-pay | Admitting: Cardiovascular Disease

## 2014-05-03 NOTE — Telephone Encounter (Signed)
Attending Physicians Statement Scanned in And Mailed to  [place], [place], [address] 10.30.15/km

## 2014-05-20 ENCOUNTER — Other Ambulatory Visit: Payer: Self-pay

## 2014-05-20 NOTE — Telephone Encounter (Signed)
Pt left v/m requesting rx for Adderall. Call when ready for pick up.  

## 2014-05-21 MED ORDER — AMPHETAMINE-DEXTROAMPHETAMINE 10 MG PO TABS: 10.0000 mg | ORAL_TABLET | Freq: Two times a day (BID) | ORAL | Status: DC

## 2014-05-21 NOTE — Telephone Encounter (Signed)
Patient notified that script is up front ready for pickup.

## 2014-05-21 NOTE — Telephone Encounter (Signed)
Printed.  Thanks.  

## 2014-06-01 ENCOUNTER — Emergency Department: Payer: Self-pay | Admitting: Emergency Medicine

## 2014-06-01 DIAGNOSIS — R51 Headache: Secondary | ICD-10-CM | POA: Diagnosis not present

## 2014-06-01 DIAGNOSIS — Z880 Allergy status to penicillin: Secondary | ICD-10-CM | POA: Diagnosis not present

## 2014-06-01 DIAGNOSIS — S022XXA Fracture of nasal bones, initial encounter for closed fracture: Secondary | ICD-10-CM | POA: Diagnosis not present

## 2014-06-01 DIAGNOSIS — Z87891 Personal history of nicotine dependence: Secondary | ICD-10-CM | POA: Diagnosis not present

## 2014-06-01 DIAGNOSIS — R001 Bradycardia, unspecified: Secondary | ICD-10-CM | POA: Diagnosis not present

## 2014-06-01 DIAGNOSIS — S0990XA Unspecified injury of head, initial encounter: Secondary | ICD-10-CM | POA: Diagnosis not present

## 2014-06-03 ENCOUNTER — Other Ambulatory Visit: Payer: Self-pay | Admitting: *Deleted

## 2014-06-03 MED ORDER — LEVOTHYROXINE SODIUM 50 MCG PO TABS: ORAL_TABLET | ORAL | Status: DC

## 2014-06-03 NOTE — Telephone Encounter (Signed)
Received a faxed refill request from mail order pharmacy. Refill sent to pharmacy electronically.

## 2014-06-17 ENCOUNTER — Other Ambulatory Visit: Payer: Self-pay

## 2014-06-17 MED ORDER — AMPHETAMINE-DEXTROAMPHETAMINE 10 MG PO TABS: 10.0000 mg | ORAL_TABLET | Freq: Two times a day (BID) | ORAL | Status: DC

## 2014-06-17 NOTE — Telephone Encounter (Signed)
Pt left v/m requesting rx for Adderall. Call when ready for pick up. Last printed 05/21/14.

## 2014-06-17 NOTE — Telephone Encounter (Signed)
Printed.  Thanks.  

## 2014-06-18 NOTE — Telephone Encounter (Signed)
Patient advised.  Rx left at front desk for pick up. 

## 2014-07-24 ENCOUNTER — Other Ambulatory Visit: Payer: Self-pay

## 2014-07-24 MED ORDER — AMPHETAMINE-DEXTROAMPHETAMINE 10 MG PO TABS: 10.0000 mg | ORAL_TABLET | Freq: Two times a day (BID) | ORAL | Status: DC

## 2014-07-24 NOTE — Telephone Encounter (Signed)
Pt left v/m requesting rx for Adderall. Call when ready for pick up. Pt request to pick up this afternoon.pt last seen 03/07/2014.

## 2014-07-24 NOTE — Telephone Encounter (Signed)
Patient notified by telephone that script is up front ready for pickup. 

## 2014-07-24 NOTE — Telephone Encounter (Signed)
Printed, thanks

## 2014-08-14 ENCOUNTER — Ambulatory Visit (INDEPENDENT_AMBULATORY_CARE_PROVIDER_SITE_OTHER): Payer: Medicare Other | Admitting: *Deleted

## 2014-08-14 DIAGNOSIS — I472 Ventricular tachycardia: Secondary | ICD-10-CM | POA: Diagnosis not present

## 2014-08-14 DIAGNOSIS — I4729 Other ventricular tachycardia: Secondary | ICD-10-CM

## 2014-08-14 LAB — MDC_IDC_ENUM_SESS_TYPE_INCLINIC
Battery Remaining Longevity: 114 mo
Brady Statistic RV Percent Paced: 4 %
Date Time Interrogation Session: 20160210050000
HighPow Impedance: 43 Ohm
HighPow Impedance: 66 Ohm
Implantable Pulse Generator Serial Number: 277857
Lead Channel Impedance Value: 738 Ohm
Lead Channel Pacing Threshold Amplitude: 1.2 V
Lead Channel Pacing Threshold Pulse Width: 0.8 ms
Lead Channel Sensing Intrinsic Amplitude: 12.6 mV
Lead Channel Setting Pacing Amplitude: 2.4 V
Lead Channel Setting Pacing Pulse Width: 0.8 ms
Lead Channel Setting Sensing Sensitivity: 0.4 mV
Zone Setting Detection Interval: 286 ms
Zone Setting Detection Interval: 324 ms

## 2014-08-14 NOTE — Progress Notes (Signed)
ICD check in office. 

## 2014-08-28 ENCOUNTER — Other Ambulatory Visit: Payer: Self-pay

## 2014-08-28 MED ORDER — AMPHETAMINE-DEXTROAMPHETAMINE 10 MG PO TABS: 10.0000 mg | ORAL_TABLET | Freq: Two times a day (BID) | ORAL | Status: DC

## 2014-08-28 NOTE — Telephone Encounter (Signed)
Pt left v/m requesting rx for Adderall. Call when ready for pick up. Pt last seen 03/07/2014 and last print rx 07/24/14.

## 2014-08-28 NOTE — Telephone Encounter (Signed)
Left detailed message on home answering machine that script is up front ready for pickup.

## 2014-08-28 NOTE — Telephone Encounter (Signed)
Printed.  Thanks.  

## 2014-09-03 ENCOUNTER — Telehealth: Payer: Self-pay

## 2014-09-03 NOTE — Telephone Encounter (Signed)
pt left v/m; pt requesting status of adderall prior authorization. Pt will be out of med on 09/04/14. Spoke with Heidi at [employer] and will fax prior auth info to [phone].Marland Kitchen

## 2014-09-05 ENCOUNTER — Encounter: Payer: Self-pay | Admitting: Internal Medicine

## 2014-09-05 MED ORDER — AMPHETAMINE-DEXTROAMPHETAMINE 10 MG PO TABS: 10.0000 mg | ORAL_TABLET | Freq: Two times a day (BID) | ORAL | Status: DC

## 2014-09-05 NOTE — Telephone Encounter (Signed)
Left message on voicemail for pt to return my call--called [facility] spoke with pharmacist and he states the quantity was changed to #60 and was approved--pt picked up medication last night-- I noticed the last few Rx were sent with a quantity of #65--insurance will not allow that as they ONLY go in 30 day increments--will not approve more than #60-- prior auth was done last yr 12/2013 for #60 and was approved through 2018---would you like me to d/c the #65 so this problem will not come up again--please advise

## 2014-09-05 NOTE — Telephone Encounter (Signed)
I changed it to 68.  Thanks.

## 2014-09-08 ENCOUNTER — Other Ambulatory Visit: Payer: Self-pay | Admitting: Cardiovascular Disease

## 2014-09-13 ENCOUNTER — Ambulatory Visit: Payer: Medicare Other | Admitting: Neurology

## 2014-09-19 ENCOUNTER — Ambulatory Visit: Payer: Medicare Other | Admitting: Cardiovascular Disease

## 2014-09-24 ENCOUNTER — Other Ambulatory Visit: Payer: Self-pay

## 2014-09-24 ENCOUNTER — Other Ambulatory Visit: Payer: Self-pay | Admitting: *Deleted

## 2014-09-24 MED ORDER — AMPHETAMINE-DEXTROAMPHETAMINE 10 MG PO TABS: 10.0000 mg | ORAL_TABLET | Freq: Two times a day (BID) | ORAL | Status: DC

## 2014-09-24 MED ORDER — DILTIAZEM HCL ER COATED BEADS 120 MG PO CP24: ORAL_CAPSULE | ORAL | Status: DC

## 2014-09-24 NOTE — Telephone Encounter (Signed)
Patient called requesting a refill on Adderall. Call when ready for pickup.

## 2014-09-24 NOTE — Telephone Encounter (Signed)
Patient advised.  Rx left at front desk for pick up. 

## 2014-09-24 NOTE — Telephone Encounter (Signed)
Printed.  Thanks.  

## 2014-10-02 ENCOUNTER — Other Ambulatory Visit: Payer: Self-pay

## 2014-10-02 MED ORDER — MONTELUKAST SODIUM 10 MG PO TABS: ORAL_TABLET | ORAL | Status: DC

## 2014-10-02 NOTE — Telephone Encounter (Signed)
Pt left v/m requesting refill singulair to [facility]. Pt had annual 03/07/14. Pt's wife notified done.

## 2014-10-11 ENCOUNTER — Other Ambulatory Visit: Payer: Self-pay | Admitting: Cardiovascular Disease

## 2014-10-14 ENCOUNTER — Ambulatory Visit (INDEPENDENT_AMBULATORY_CARE_PROVIDER_SITE_OTHER): Payer: Medicare Other | Admitting: Cardiovascular Disease

## 2014-10-14 ENCOUNTER — Encounter: Payer: Self-pay | Admitting: Cardiovascular Disease

## 2014-10-14 ENCOUNTER — Telehealth: Payer: Self-pay | Admitting: Neurology

## 2014-10-14 VITALS — BP 109/80 | HR 67 | Ht 69.0 in | Wt 179.1 lb

## 2014-10-14 DIAGNOSIS — I10 Essential (primary) hypertension: Secondary | ICD-10-CM | POA: Diagnosis not present

## 2014-10-14 DIAGNOSIS — E785 Hyperlipidemia, unspecified: Secondary | ICD-10-CM | POA: Diagnosis not present

## 2014-10-14 DIAGNOSIS — R413 Other amnesia: Secondary | ICD-10-CM

## 2014-10-14 MED ORDER — ROSUVASTATIN CALCIUM 10 MG PO TABS: 10.0000 mg | ORAL_TABLET | Freq: Every day | ORAL | Status: DC

## 2014-10-14 MED ORDER — DONEPEZIL HCL 10 MG PO TABS: 10.0000 mg | ORAL_TABLET | Freq: Every day | ORAL | Status: DC

## 2014-10-14 MED ORDER — METOPROLOL TARTRATE 25 MG PO TABS: 25.0000 mg | ORAL_TABLET | Freq: Two times a day (BID) | ORAL | Status: DC

## 2014-10-14 NOTE — Telephone Encounter (Signed)
Patient is calling in regard to donepezil 10 mg 1 tablet per day.  He needs his 90 day refill sent to [facility], [address] [place]  [address].  Phone [phone]. Fax [phone]. Thanks!

## 2014-10-14 NOTE — Telephone Encounter (Signed)
CORRECTION: Patient is calling back and states actual address given is [address], [place], [place]. Phone number is still [phone]. Fax [phone].

## 2014-10-14 NOTE — Patient Instructions (Signed)
Your physician wants you to follow-up in: 6 MONTHS with Dr Cooper.  You will receive a reminder letter in the mail two months in advance. If you don't receive a letter, please call our office to schedule the follow-up appointment.  Your physician recommends that you continue on your current medications as directed. Please refer to the Current Medication list given to you today.  

## 2014-10-14 NOTE — Progress Notes (Signed)
Cardiology Office Note   Date:  10/14/2014   ID:  Damaris, Geers June 01, 1951, MRN [id number]  PCP:  Elsie Stain, MD  Cardiologist:  Sherren Mocha, MD    Chief Complaint  Patient presents with  . Appointment    ROV     History of Present Illness: Larry [name] is a 64 y.o. male who presents for follow-up evaluation. He has undergone ICD implantation after remote cardiac arrest.   He is doing well except for generalized fatigue. No CP, shortness of breath, edema, or heart palpitations.     Past Medical History  Diagnosis Date  . Cardiac arrest     aborted  . Depression   . PVC (premature ventricular contraction)     associated with cardiac arrest  . Melanoma in situ of skin of trunk     chest  . Headache(784.0)   . Low back pain syndrome   . ADD (attention deficit disorder)   . Dementia     post resusitative  . ICD (implantable cardiac defibrillator) in place   . Allergy   . Idiopathic urticaria   . Sleep apnea   . Hyperlipidemia   . GERD (gastroesophageal reflux disease)   . Tubular adenoma of colon 10/2010  . Diverticulosis   . Internal hemorrhoids   . Cancer     melanoma  . Hypothyroidism     Past Surgical History  Procedure Laterality Date  . Hemorrhoid surgery    .  implantation x2    . Icd    . Cardiac defibrillator removal  2004    Defibrillator change Caryl Comes)  . Tonsillectomy    . Cardiac defibrillator placement  07/01/2010    Explanation of a previously implanted device, pocket revision, and insertion of a new device, and intraoperative defibrillation threshold testing Caryl Comes)    Current Outpatient Prescriptions  Medication Sig Dispense Refill  . amphetamine-dextroamphetamine (ADDERALL) 10 MG tablet Take 1 tablet (10 mg total) by mouth 2 (two) times daily. 60 tablet 0  . Ascorbic Acid (VITAMIN C) 500 MG tablet Take 500 mg by mouth daily.      Marland Kitchen aspirin 81 MG tablet Take 81 mg by mouth daily.    . CRESTOR 10 MG tablet TAKE 1  TABLET AT BEDTIME 90 tablet 0  . diltiazem (CARDIZEM CD) 120 MG 24 hr capsule TAKE ONE CAPSULE BY MOUTH EVERY DAY 90 capsule 1  . donepezil (ARICEPT) 10 MG tablet Take 1 tablet (10 mg total) by mouth daily. 90 tablet 0  . fexofenadine (ALLEGRA) 180 MG tablet Take 180 mg by mouth daily.      . Ginkgo Biloba 40 MG TABS Take by mouth 2 (two) times daily.      Marland Kitchen levothyroxine (SYNTHROID, LEVOTHROID) 50 MCG tablet TAKE 1 TABLET (50 MCG TOTAL) BY MOUTH DAILY. EXCEPT FOR 1.5 TABS ON SUNDAYS and WEDNESDAYS 105 tablet 1  . metoprolol tartrate (LOPRESSOR) 25 MG tablet TAKE 1 TABLET TWICE A DAY 180 tablet 0  . montelukast (SINGULAIR) 10 MG tablet TAKE 1 TABLET BY MOUTH ONCE A DAY 30 tablet 5  . Multiple Vitamin (MULTIVITAMIN) tablet Take 1 tablet by mouth daily.      . NON FORMULARY Focus factor 1 tab po bid     . Omega-3 Fatty Acids (FISH OIL) 1000 MG CAPS Take by mouth 1 dose over 46 hours.      . vitamin E 400 UNIT capsule Take 400 Units by mouth daily.  No current facility-administered medications for this visit.    Allergies:   Penicillins   Social History:  The patient  reports that he quit smoking about 22 years ago. He has quit using smokeless tobacco. His smokeless tobacco use included Chew. He reports that he drinks alcohol. He reports that he does not use illicit drugs.   Family History:  The patient's  family history includes Alopecia in his father; Cancer in his paternal grandfather; Coronary artery disease in his father; Diabetes in his brother; Diverticulosis in his father; Heart disease in his father; Hypertension in his mother; Lung cancer in his paternal grandfather; Prostate cancer in his brother. There is no history of Colon cancer.    ROS:  Please see the history of present illness.  Otherwise, review of systems is positive for fatigue.  All other systems are reviewed and negative.    PHYSICAL EXAM: VS:  BP 109/80 mmHg  Pulse 67  Ht 5\' 9"  (1.753 m)  Wt 179 lb 1.9 oz  (81.248 kg)  BMI 26.44 kg/m2 , BMI Body mass index is 26.44 kg/(m^2). GEN: Well nourished, well developed, in no acute distress HEENT: normal Neck: no JVD, no masses. No carotid bruits Cardiac: RRR without murmur or gallop                Respiratory:  clear to auscultation bilaterally, normal work of breathing GI: soft, nontender, nondistended, + BS MS: no deformity or atrophy Ext: no pretibial edema, pedal pulses 2+= bilaterally Skin: warm and dry, no rash Neuro:  Strength and sensation are intact Psych: euthymic mood, full affect  EKG:  EKG is ordered today. The ekg ordered today shows NSR 67 bpm, within normal limits  Recent Labs: 03/05/2014: BUN 11; Creatinine 1.0; Potassium 4.1; Sodium 138; TSH 5.88*   Lipid Panel     Component Value Date/Time   CHOL 192 03/05/2014 0841   TRIG 214.0* 03/05/2014 0841   HDL 50.00 03/05/2014 0841   CHOLHDL 4 03/05/2014 0841   VLDL 42.8* 03/05/2014 0841   LDLCALC 121* 02/08/2013 0818   LDLDIRECT 127.6 03/05/2014 0841      Wt Readings from Last 3 Encounters:  10/14/14 179 lb 1.9 oz (81.248 kg)  03/20/14 185 lb (83.915 kg)  03/12/14 183 lb 4 oz (83.122 kg)    ASSESSMENT AND PLAN: 1.  Essential HTN: BP controlled on metoprolol and diltiazem. Renal indices normal  2. Hyperlipidemia: continue rosuvastatin 10 mg. Lifestyle modification with increased exercise and dietary modification reviewed with the patient.   3. S/P ICD: followed in device clinic. Sees Dr Caryl Comes.  Current medicines are reviewed with the patient today.  The patient does not have concerns regarding medicines.  The following changes have been made:  no change  Labs/ tests ordered today include:  No orders of the defined types were placed in this encounter.   Disposition:   FU 6 months  Signed, Sherren Mocha, MD  10/14/2014 12:36 PM    [facility], [place], [place]  [address] Phone: [phone]; Fax: [phone]

## 2014-10-17 ENCOUNTER — Encounter: Payer: Self-pay | Admitting: Neurology

## 2014-10-17 ENCOUNTER — Ambulatory Visit (INDEPENDENT_AMBULATORY_CARE_PROVIDER_SITE_OTHER): Payer: Medicare Other | Admitting: Neurology

## 2014-10-17 VITALS — BP 133/88 | HR 63 | Ht 69.0 in | Wt 180.0 lb

## 2014-10-17 DIAGNOSIS — R413 Other amnesia: Secondary | ICD-10-CM

## 2014-10-17 MED ORDER — MEMANTINE HCL 10 MG PO TABS: 10.0000 mg | ORAL_TABLET | Freq: Two times a day (BID) | ORAL | Status: DC

## 2014-10-17 MED ORDER — DONEPEZIL HCL 10 MG PO TABS
10.0000 mg | ORAL_TABLET | Freq: Every day | ORAL | Status: DC
Start: 2014-10-17 — End: 2015-04-21

## 2014-10-17 NOTE — Progress Notes (Signed)
PATIENT: Larry [name]  DOB: 1950/12/24  HISTORICAL  Mr. Toops is a very friendly 64 year old right-handed gentleman presents for followup consultation of his memory loss in the context of anoxic brain injury.   He is accompanied by his wife today. He was a patient of Dr. Erling Cruz, felt that the patient was stable but there were problems with his insurance not paying for his medications. The patient has an underlying medical history of cardiac death from ventricular fibrillation with hypoxic brain injury in Jan 1999, due to arrythmia, 15 minutes without heart beat, status post defibrillator placement, He stayed in Hospital for 35 days, required prolonged rehabilitation, has to relearn walking again, as if that he did not have a past.  EEG showed diffuse slowing in January 1999 and CT head as well as brain MRI were normal. CT head repeat in November 2000 showed slight prominence of the lateral and third ventricles. CT head in July 2003 showed prominence of the ventricular system. Psychological testing and August 1999 showed borderline performance IQ. He became depressed. In January 2014 his MMSE was 29, clock drawing was 4, animal fluency was 10. He went to [facility] for 2 years for patient with degenerative disorder.  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 He lives at home with his wife, still driving. He helps the household, he still has difficulty focusing.   He has had some fluctuation in his memory per wife. He has ADD and continues to take Adderall, 10 mg twice a day, without Adderall, he could not remember the pages that he has read,he is also taking Aricept 10 mg every day, which has been very helpful too.  Up date October 17 2014  His overall doing very well, he likes play computer games, which has helped his eye and hands coordination, he had recent trip to [place], and was overwhelming to him, he  continued to drive short distance,  He is taking Ritalin 10 mg twice a day, Aricept 10 mg once a day, complains of insomnia, he took his last dose of Ritalin at evening time.  REVIEW OF SYSTEMS: Full 14 system review of systems performed and notable only for seizure, confusion, depression  ALLERGIES: Allergies  Allergen Reactions  . Penicillins     Difficult Breathing, rash    HOME MEDICATIONS: Current Outpatient Prescriptions  Medication Sig Dispense Refill  . amphetamine-dextroamphetamine (ADDERALL) 10 MG tablet Take 1 tablet (10 mg total) by mouth 2 (two) times daily. 60 tablet 0  . Ascorbic Acid (VITAMIN C) 500 MG tablet Take 500 mg by mouth daily.      Marland Kitchen aspirin 81 MG tablet Take 81 mg by mouth daily.    Marland Kitchen diltiazem (CARDIZEM CD) 120 MG 24 hr capsule TAKE ONE CAPSULE BY MOUTH EVERY DAY 90 capsule 1  . donepezil (ARICEPT) 10 MG tablet Take 1 tablet (10 mg total) by mouth daily. 90 tablet 0  . fexofenadine (ALLEGRA) 180 MG tablet Take 180 mg by mouth daily.      . Ginkgo Biloba 40 MG TABS Take by mouth 2 (two) times daily.      Marland Kitchen levothyroxine (SYNTHROID, LEVOTHROID) 50 MCG tablet TAKE 1 TABLET (50 MCG TOTAL) BY MOUTH DAILY. EXCEPT FOR 1.5 TABS ON SUNDAYS and WEDNESDAYS 105 tablet 1  . metoprolol tartrate (LOPRESSOR) 25 MG tablet Take 1 tablet (25 mg total) by mouth 2 (two) times daily. 180 tablet 3  .  montelukast (SINGULAIR) 10 MG tablet TAKE 1 TABLET BY MOUTH ONCE A DAY 30 tablet 5  . Multiple Vitamin (MULTIVITAMIN) tablet Take 1 tablet by mouth daily.      . NON FORMULARY Focus factor 1 tab po bid     . Omega-3 Fatty Acids (FISH OIL) 1000 MG CAPS Take by mouth 1 dose over 46 hours.      . rosuvastatin (CRESTOR) 10 MG tablet Take 1 tablet (10 mg total) by mouth at bedtime. 90 tablet 3  . vitamin E 400 UNIT capsule Take 400 Units by mouth daily.       No current facility-administered medications for this visit.    PAST MEDICAL HISTORY: Past Medical History  Diagnosis Date    . Cardiac arrest     aborted  . Depression   . PVC (premature ventricular contraction)     associated with cardiac arrest  . Melanoma in situ of skin of trunk     chest  . Headache(784.0)   . Low back pain syndrome   . ADD (attention deficit disorder)   . Dementia     post resusitative  . ICD (implantable cardiac defibrillator) in place   . Allergy   . Idiopathic urticaria   . Sleep apnea   . Hyperlipidemia   . GERD (gastroesophageal reflux disease)   . Tubular adenoma of colon 10/2010  . Diverticulosis   . Internal hemorrhoids   . Cancer     melanoma  . Hypothyroidism     PAST SURGICAL HISTORY: Past Surgical History  Procedure Laterality Date  . Hemorrhoid surgery    .  implantation x2    . Icd    . Cardiac defibrillator removal  2004    Defibrillator change Caryl Comes)  . Tonsillectomy    . Cardiac defibrillator placement  07/01/2010    Explanation of a previously implanted device, pocket revision, and insertion of a new device, and intraoperative defibrillation threshold testing Caryl Comes)    FAMILY HISTORY: Family History  Problem Relation Age of Onset  . Hypertension Mother   . Heart disease Father   . Diverticulosis Father   . Coronary artery disease Father   . Alopecia Father   . Prostate cancer Brother   . Lung cancer Paternal Grandfather     Smoker  . Cancer Paternal Grandfather     lung cancer - smoker  . Colon cancer Neg Hx   . Diabetes Brother     SOCIAL HISTORY:  History   Social History  . Marital Status: Married    Spouse Name: [name]  . Number of Children: 2  . Years of Education: College   Occupational History  .     Social History Main Topics  . Smoking status: Former Smoker -- 1.00 packs/day for 10 years    Quit date: 07/05/1992  . Smokeless tobacco: Former Systems developer    Types: Chew  . Alcohol Use: Yes     Comment: socially  . Drug Use: No  . Sexual Activity: Not on file   Other Topics Concern  . Not on file   Social History  Narrative   Patient is Married [name]) [phone] with 2 daughters   5 grandchildren   Daily caffeine use - one cup of coffee and one- two coke per day   Patient is right-handed.   Patient has a college education.     PHYSICAL EXAM   Filed Vitals:   10/17/14 1116  BP: 133/88  Pulse: 63  Height: 5'  9" (1.753 m)  Weight: 180 lb (81.647 kg)    Not recorded      Body mass index is 26.57 kg/(m^2).  PHYSICAL EXAMNIATION:  Gen: NAD, conversant, well nourised, obese, well groomed , anxious looking middle-age male                   Cardiovascular: Regular rate rhythm, no peripheral edema, warm, nontender. Eyes: Conjunctivae clear without exudates or hemorrhage Neck: Supple, no carotid bruise. Pulmonary: Clear to auscultation bilaterally   NEUROLOGICAL EXAM:  MENTAL STATUS: Speech:    Speech is normal; fluent and spontaneous with normal comprehension.  Cognition:  Mini-Mental Status Examination is 27 out of 30, he missed 3 out of 3 recalls,   CRANIAL NERVES: CN II: Visual fields are full to confrontation. Fundoscopic exam is normal with sharp discs and no vascular changes. Venous pulsations are present bilaterally. Pupils are 4 mm and briskly reactive to light. Visual acuity is 20/20 bilaterally. CN III, IV, VI: extraocular movement are normal. No ptosis. CN V: Facial sensation is intact to pinprick in all 3 divisions bilaterally. Corneal responses are intact.  CN VII: Face is symmetric with normal eye closure and smile. CN VIII: Hearing is normal to rubbing fingers CN IX, X: Palate elevates symmetrically. Phonation is normal. CN XI: Head turning and shoulder shrug are intact CN XII: Tongue is midline with normal movements and no atrophy.  MOTOR: He has mild bilateral upper extremity rigidity, fixation of left upper extremity up on rapid rotating movement,   REFLEXES: Reflexes are 2+ and symmetric at the biceps, triceps, knees, and ankles. Plantar responses are  flexor.  SENSORY: Light touch, pinprick, position sense, and vibration sense are intact in fingers and toes.  COORDINATION: Rapid alternating movements and fine finger movements are intact. There is no dysmetria on finger-to-nose and heel-knee-shin. There are no abnormal or extraneous movements.   GAIT/STANCE: Tends to hold his left hand in left elbow flexion, cautious, mildly unsteady  Lab Results  Component Value Date   WBC 6.4 08/17/2011   HGB 14.2 08/17/2011   HCT 42.5 08/17/2011   MCV 93.0 08/17/2011   PLT 275.0 08/17/2011      Component Value Date/Time   NA 138 03/05/2014 0841   K 4.1 03/05/2014 0841   CL 102 03/05/2014 0841   CO2 28 03/05/2014 0841   GLUCOSE 94 03/05/2014 0841   BUN 11 03/05/2014 0841   CREATININE 1.0 03/05/2014 0841   CALCIUM 9.5 03/05/2014 0841   PROT 6.6 02/08/2013 0818   ALBUMIN 4.1 02/08/2013 0818   AST 23 02/08/2013 0818   ALT 31 02/08/2013 0818   ALKPHOS 56 02/08/2013 0818   BILITOT 0.8 02/08/2013 0818   GFRNONAA 95.91 03/12/2010 0841   GFRAA  01/25/2008 1236    >60        The eGFR has been calculated using the MDRD equation. This calculation has not been validated in all clinical   Lab Results  Component Value Date   CHOL 192 03/05/2014   HDL 50.00 03/05/2014   LDLCALC 121* 02/08/2013   LDLDIRECT 127.6 03/05/2014   TRIG 214.0* 03/05/2014   CHOLHDL 4 03/05/2014   No results found for: HGBA1C No results found for: VITAMINB12 Lab Results  Component Value Date   TSH 5.88* 03/05/2014      ASSESSMENT AND PLAN  MAOR MECKEL is a 64 y.o. male with past medical history of anoxic brain injury,  With residual short-term memory trouble, Mini-Mental Status Examination 27  out of 30 today, overall doing very well.   1 continue Aricept 10 mg every day 2. Add on Namenda 10 mg twice a day 3. He has attention deficit disorder, insomnia, taking Ritalin, I have advised him take last dose of Ritalin before 3 PM,  Marcial Pacas, M.D.  Ph.D.  [facility] [address] [place], [place] [address] Ph: [phone] Fax: [phone]

## 2014-10-31 ENCOUNTER — Encounter: Payer: Self-pay | Admitting: Family Medicine

## 2014-10-31 ENCOUNTER — Ambulatory Visit (INDEPENDENT_AMBULATORY_CARE_PROVIDER_SITE_OTHER): Payer: Medicare Other | Admitting: Family Medicine

## 2014-10-31 VITALS — BP 122/84 | HR 96 | Temp 98.6°F | Wt 177.5 lb

## 2014-10-31 DIAGNOSIS — J069 Acute upper respiratory infection, unspecified: Secondary | ICD-10-CM | POA: Diagnosis not present

## 2014-10-31 DIAGNOSIS — R197 Diarrhea, unspecified: Secondary | ICD-10-CM

## 2014-10-31 DIAGNOSIS — R413 Other amnesia: Secondary | ICD-10-CM | POA: Diagnosis not present

## 2014-10-31 MED ORDER — AMPHETAMINE-DEXTROAMPHETAMINE 10 MG PO TABS: 10.0000 mg | ORAL_TABLET | Freq: Two times a day (BID) | ORAL | Status: DC

## 2014-10-31 MED ORDER — MEMANTINE HCL 10 MG PO TABS: ORAL_TABLET | ORAL | Status: DC

## 2014-10-31 NOTE — Patient Instructions (Signed)
Go to the lab on the way out.  We'll contact you with your lab report. Stop namenda for now.  Rest and fluids, tylenol for aches.  Gargle with salt water and use nasal saline as needed.  Imodium 1-2 tabs a day if needed for diarrhea.  Take care. Glad to see you.

## 2014-10-31 NOTE — Progress Notes (Signed)
Pre visit review using our clinic review tool, if applicable. No additional management support is needed unless otherwise documented below in the visit note.  He had been having more trouble recently (trouble with concentration), after namenda was added but it may be the issues below have affected his cognitive function (and patient believes that to be the case).  In the last week, had a rash x24h (typical for him, usually with an illness), fatigue, fever.  No vomiting, no cough.  No rhinorrhea, no ST.  Some frontal HA, bilateral.  No ear pain.  He just doesn't feel well in general but not focally.    No dysuria.  He had loose stools for the last 6-8 weeks. No vomiting.  No abd pain usually, occ LLQ, not associated with BM.  No blood in stool.  Predates the illness above.   Meds, vitals, and allergies reviewed.   ROS: See HPI.  Otherwise, noncontributory.  GEN: nad, alert and oriented HEENT: mucous membranes moist, tm w/o erythema, nasal exam w/o erythema, clear discharge noted,  OP with cobblestoning NECK: supple w/o LA CV: rrr.   PULM: ctab, no inc wob ABD: soft, not ttp, normal BS EXT: no edema SKIN: blanching non itching red rash on the arms, legs, and upper trunk, spares the axilla B and lower trunk

## 2014-11-01 DIAGNOSIS — R197 Diarrhea, unspecified: Secondary | ICD-10-CM | POA: Insufficient documentation

## 2014-11-01 DIAGNOSIS — J069 Acute upper respiratory infection, unspecified: Secondary | ICD-10-CM | POA: Insufficient documentation

## 2014-11-01 LAB — CBC WITH DIFFERENTIAL/PLATELET
Basophils Absolute: 0.1 10*3/uL (ref 0.0–0.1)
Basophils Relative: 0.4 % (ref 0.0–3.0)
Eosinophils Absolute: 0.1 10*3/uL (ref 0.0–0.7)
Eosinophils Relative: 0.5 % (ref 0.0–5.0)
HCT: 43 % (ref 39.0–52.0)
Hemoglobin: 14.6 g/dL (ref 13.0–17.0)
Lymphocytes Relative: 14.2 % (ref 12.0–46.0)
Lymphs Abs: 1.7 10*3/uL (ref 0.7–4.0)
MCHC: 33.9 g/dL (ref 30.0–36.0)
MCV: 90.7 fl (ref 78.0–100.0)
Monocytes Absolute: 1.2 10*3/uL — ABNORMAL HIGH (ref 0.1–1.0)
Monocytes Relative: 10.3 % (ref 3.0–12.0)
Neutro Abs: 9 10*3/uL — ABNORMAL HIGH (ref 1.4–7.7)
Neutrophils Relative %: 74.6 % (ref 43.0–77.0)
Platelets: 296 10*3/uL (ref 150.0–400.0)
RBC: 4.74 Mil/uL (ref 4.22–5.81)
RDW: 13 % (ref 11.5–15.5)
WBC: 12.1 10*3/uL — ABNORMAL HIGH (ref 4.0–10.5)

## 2014-11-01 LAB — COMPREHENSIVE METABOLIC PANEL
ALT: 66 U/L — ABNORMAL HIGH (ref 0–53)
AST: 67 U/L — ABNORMAL HIGH (ref 0–37)
Albumin: 4.2 g/dL (ref 3.5–5.2)
Alkaline Phosphatase: 99 U/L (ref 39–117)
BUN: 11 mg/dL (ref 6–23)
CO2: 29 mEq/L (ref 19–32)
Calcium: 9.8 mg/dL (ref 8.4–10.5)
Chloride: 99 mEq/L (ref 96–112)
Creatinine, Ser: 0.97 mg/dL (ref 0.40–1.50)
GFR: 83.27 mL/min (ref >60.00)
Glucose, Bld: 94 mg/dL (ref 70–99)
Potassium: 4.3 mEq/L (ref 3.5–5.1)
Sodium: 136 mEq/L (ref 135–145)
Total Bilirubin: 0.4 mg/dL (ref 0.2–1.2)
Total Protein: 7.3 g/dL (ref 6.0–8.3)

## 2014-11-01 NOTE — Addendum Note (Signed)
Addended by: Ellamae Sia on: 11/01/2014 04:55 PM   Modules accepted: Orders

## 2014-11-01 NOTE — Assessment & Plan Note (Signed)
Recent memory/concentration changes may be related to URI or the diarrhea, but okay to hold namenda for now.

## 2014-11-01 NOTE — Assessment & Plan Note (Signed)
Benign exam today, no clear source.  No others with similar.  Will check basic labs today, he agrees.  Can try imodium in the meantime.

## 2014-11-01 NOTE — Assessment & Plan Note (Signed)
Likely benign viral process, checking basic labs due to diarrhea, not this.  Avoid decongestants but treat symptomatically o/w . See AVS.  Recent memory concentration changes may be related to this or the diarrhea, but okay to hold namenda for now.

## 2014-11-02 LAB — C. DIFFICILE GDH AND TOXIN A/B
C. difficile GDH: NOT DETECTED
C. difficile Toxin A/B: NOT DETECTED

## 2014-11-05 LAB — STOOL CULTURE

## 2014-11-07 DIAGNOSIS — J019 Acute sinusitis, unspecified: Secondary | ICD-10-CM | POA: Diagnosis not present

## 2014-11-19 ENCOUNTER — Other Ambulatory Visit: Payer: Self-pay | Admitting: Family Medicine

## 2014-11-19 ENCOUNTER — Other Ambulatory Visit: Payer: Self-pay | Admitting: Cardiovascular Disease

## 2014-11-28 ENCOUNTER — Other Ambulatory Visit: Payer: Self-pay | Admitting: *Deleted

## 2014-11-28 MED ORDER — AMPHETAMINE-DEXTROAMPHETAMINE 10 MG PO TABS: 10.0000 mg | ORAL_TABLET | Freq: Two times a day (BID) | ORAL | Status: DC

## 2014-11-28 NOTE — Telephone Encounter (Signed)
Printed.  Thanks.  

## 2014-11-28 NOTE — Telephone Encounter (Signed)
Pt left voicemail at Triage requesting Rx refill, please call pt when ready

## 2014-11-29 NOTE — Telephone Encounter (Signed)
Rx left in front office for pick up and pt is aware  

## 2014-12-23 ENCOUNTER — Ambulatory Visit (INDEPENDENT_AMBULATORY_CARE_PROVIDER_SITE_OTHER): Payer: Medicare Other | Admitting: *Deleted

## 2014-12-23 ENCOUNTER — Other Ambulatory Visit: Payer: Self-pay

## 2014-12-23 DIAGNOSIS — I469 Cardiac arrest, cause unspecified: Secondary | ICD-10-CM

## 2014-12-23 DIAGNOSIS — Z95810 Presence of automatic (implantable) cardiac defibrillator: Secondary | ICD-10-CM | POA: Diagnosis not present

## 2014-12-23 DIAGNOSIS — I472 Ventricular tachycardia: Secondary | ICD-10-CM | POA: Diagnosis not present

## 2014-12-23 DIAGNOSIS — I4729 Other ventricular tachycardia: Secondary | ICD-10-CM

## 2014-12-23 NOTE — Telephone Encounter (Signed)
Pt left v/m requesting 90 day rx for Adderall. Call when ready for pick up. Pt said [facility] said that it is legal in [place] for adderall to be a 90 day rx; pt could get 90 day rx for $48.00 and presently pt paying $48.00 for monthly rx locally. Pt request cb. Last rx printed # 60 on 11/28/14 and last annual 03/07/14.Please advise. Have not changed quantity on rx until approved by Dr Damita Dunnings.

## 2014-12-24 MED ORDER — AMPHETAMINE-DEXTROAMPHETAMINE 10 MG PO TABS: 10.0000 mg | ORAL_TABLET | Freq: Two times a day (BID) | ORAL | Status: DC

## 2014-12-24 NOTE — Telephone Encounter (Signed)
Printed.  Thanks.  

## 2014-12-24 NOTE — Telephone Encounter (Signed)
Patient advised.  Rx left at front desk for pick up. 

## 2014-12-25 ENCOUNTER — Encounter: Payer: Self-pay | Admitting: Internal Medicine

## 2014-12-25 LAB — CUP PACEART INCLINIC DEVICE CHECK
Brady Statistic RV Percent Paced: 3 %
Brady Statistic RV Percent Paced: 3 %
Date Time Interrogation Session: 20160622094235
Date Time Interrogation Session: 20160622092936
HighPow Impedance: 65 Ohm
HighPow Impedance: 65 Ohm
Lead Channel Impedance Value: 750 Ohm
Lead Channel Impedance Value: 750 Ohm
Lead Channel Pacing Threshold Amplitude: 1.2 V
Lead Channel Pacing Threshold Amplitude: 1.2 V
Lead Channel Pacing Threshold Pulse Width: 0.8 ms
Lead Channel Pacing Threshold Pulse Width: 0.8 ms
Lead Channel Sensing Intrinsic Amplitude: 14.2 mV
Lead Channel Sensing Intrinsic Amplitude: 14.2 mV
Lead Channel Setting Pacing Amplitude: 2.4 V
Lead Channel Setting Pacing Amplitude: 2.4 V
Lead Channel Setting Pacing Pulse Width: 0.8 ms
Lead Channel Setting Pacing Pulse Width: 0.8 ms
Lead Channel Setting Sensing Sensitivity: 0.4 mV
Lead Channel Setting Sensing Sensitivity: 0.4 mV
Pulse Gen Serial Number: 277857
Pulse Gen Serial Number: 277857
Zone Setting Detection Interval: 286 ms
Zone Setting Detection Interval: 324 ms
Zone Setting Detection Interval: 286 ms
Zone Setting Detection Interval: 324 ms

## 2014-12-25 NOTE — Progress Notes (Signed)
ICD check in clinic. Normal device function. Threshold and sensing consistent with previous device measurements. Impedance trends stable over time. 1 NSVT---6 beats. Histogram distribution appropriate for patient and level of activity. No changes made this session. Device programmed at appropriate safety margins---changed VF initial detection duration from 1.0 to 2.5s. Device programmed to optimize intrinsic conduction. Estimated longevity 62yrs. ROV w/ SK/B in 79mo.

## 2014-12-29 ENCOUNTER — Other Ambulatory Visit: Payer: Self-pay | Admitting: Cardiovascular Disease

## 2014-12-30 ENCOUNTER — Other Ambulatory Visit: Payer: Self-pay

## 2014-12-30 DIAGNOSIS — I10 Essential (primary) hypertension: Secondary | ICD-10-CM

## 2014-12-30 DIAGNOSIS — E785 Hyperlipidemia, unspecified: Secondary | ICD-10-CM

## 2014-12-30 MED ORDER — METOPROLOL TARTRATE 25 MG PO TABS: 25.0000 mg | ORAL_TABLET | Freq: Two times a day (BID) | ORAL | Status: DC

## 2015-01-16 ENCOUNTER — Other Ambulatory Visit: Payer: Self-pay

## 2015-01-16 ENCOUNTER — Telehealth: Payer: Self-pay | Admitting: Family Medicine

## 2015-01-16 MED ORDER — AMPHETAMINE-DEXTROAMPHETAMINE 10 MG PO TABS: 10.0000 mg | ORAL_TABLET | Freq: Two times a day (BID) | ORAL | Status: DC

## 2015-01-16 NOTE — Telephone Encounter (Signed)
Patient advised.  Rx left at front desk for pick up. 

## 2015-01-16 NOTE — Telephone Encounter (Signed)
Printed.  Thanks.  

## 2015-01-16 NOTE — Telephone Encounter (Signed)
Pt request 30 day rx adderall. Call when ready for pick up. The 12/2014 90 day rx has not been sent to [place] yet. Last rx was # 180 on 12/24/14. Last annual exam 03/07/2014.

## 2015-01-23 NOTE — Telephone Encounter (Signed)
error 

## 2015-03-10 ENCOUNTER — Other Ambulatory Visit: Payer: Self-pay | Admitting: Cardiovascular Disease

## 2015-03-11 ENCOUNTER — Other Ambulatory Visit: Payer: Self-pay | Admitting: *Deleted

## 2015-03-11 MED ORDER — MONTELUKAST SODIUM 10 MG PO TABS: ORAL_TABLET | ORAL | Status: DC

## 2015-03-11 NOTE — Telephone Encounter (Signed)
Received faxed refill request from pharmacy. Refill sent to pharmacy electronically. 

## 2015-03-24 ENCOUNTER — Encounter: Payer: Self-pay | Admitting: Internal Medicine

## 2015-04-08 ENCOUNTER — Encounter: Payer: Self-pay | Admitting: *Deleted

## 2015-04-11 IMAGING — CR DG CHEST 2V
2 series · 2 of 2 positions shown · non-contrast
Comparison: 11/02/2010

CLINICAL DATA: Right-sided chest pain

CHEST - 2 VIEW

[view not recorded (1 of 2)]
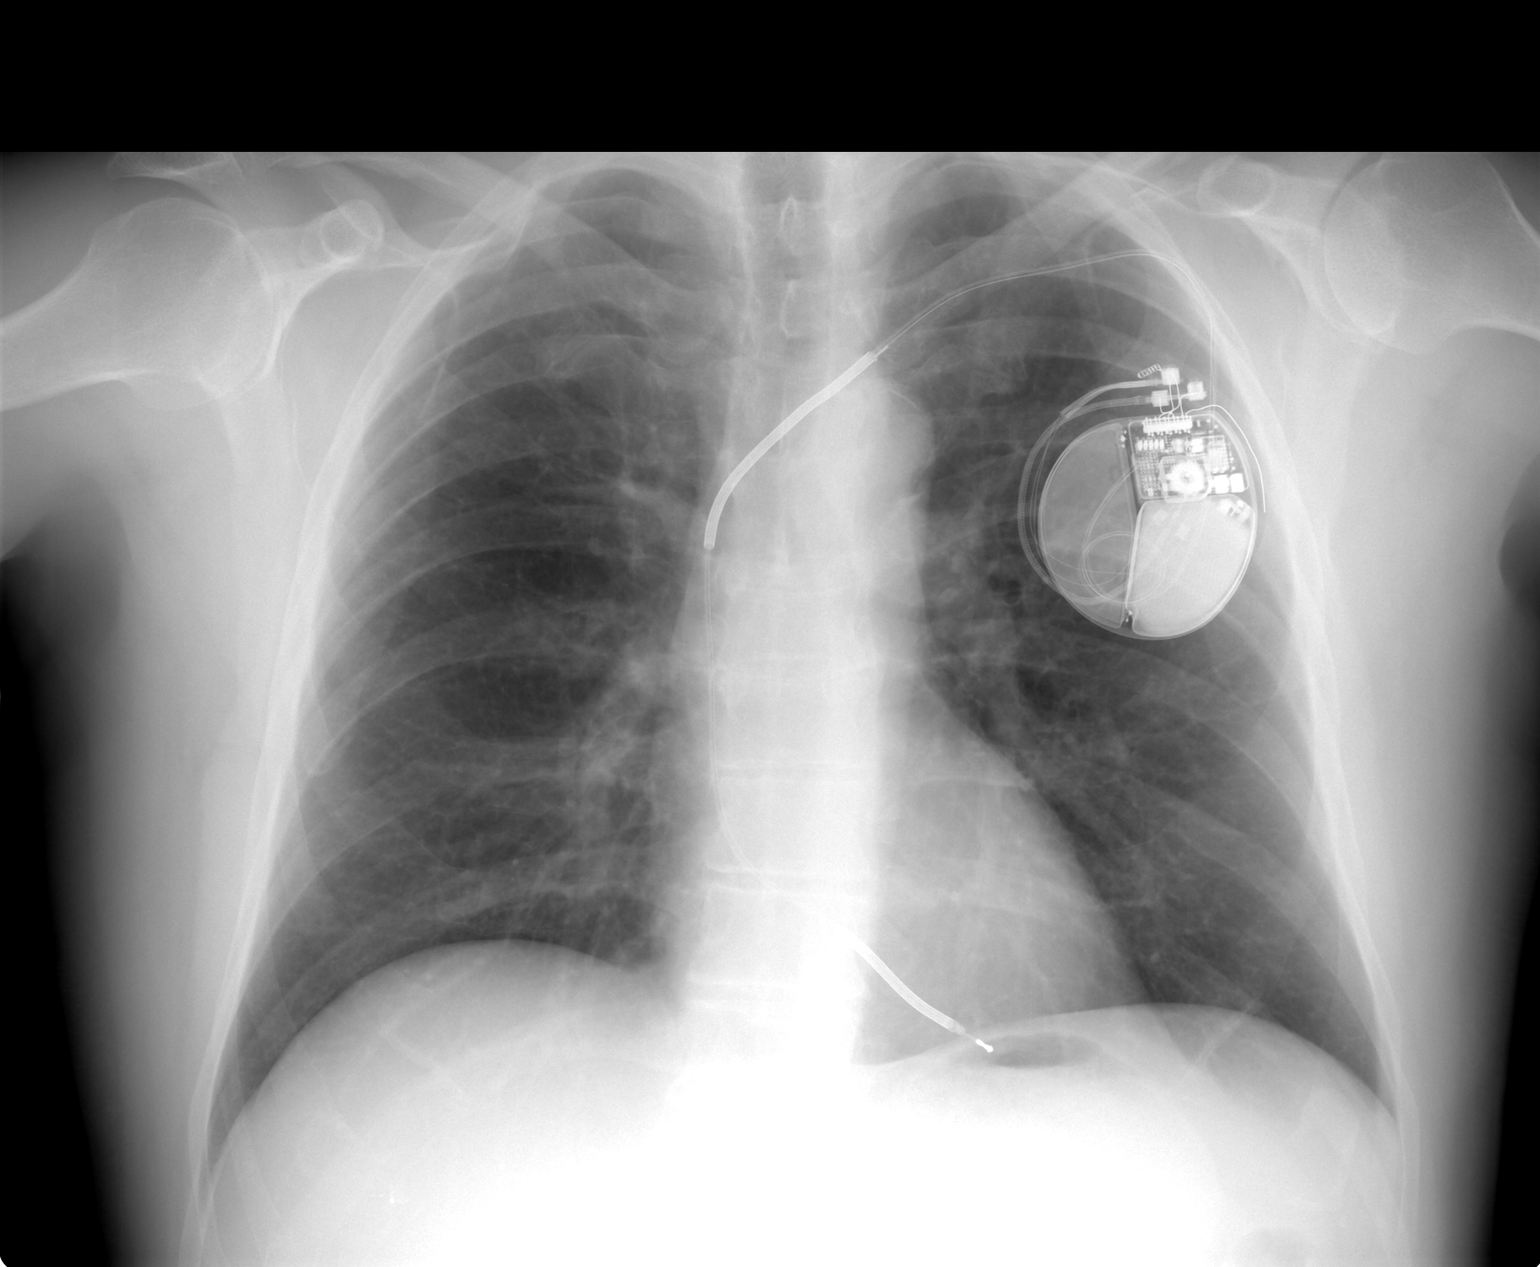

[view not recorded (2 of 2)]
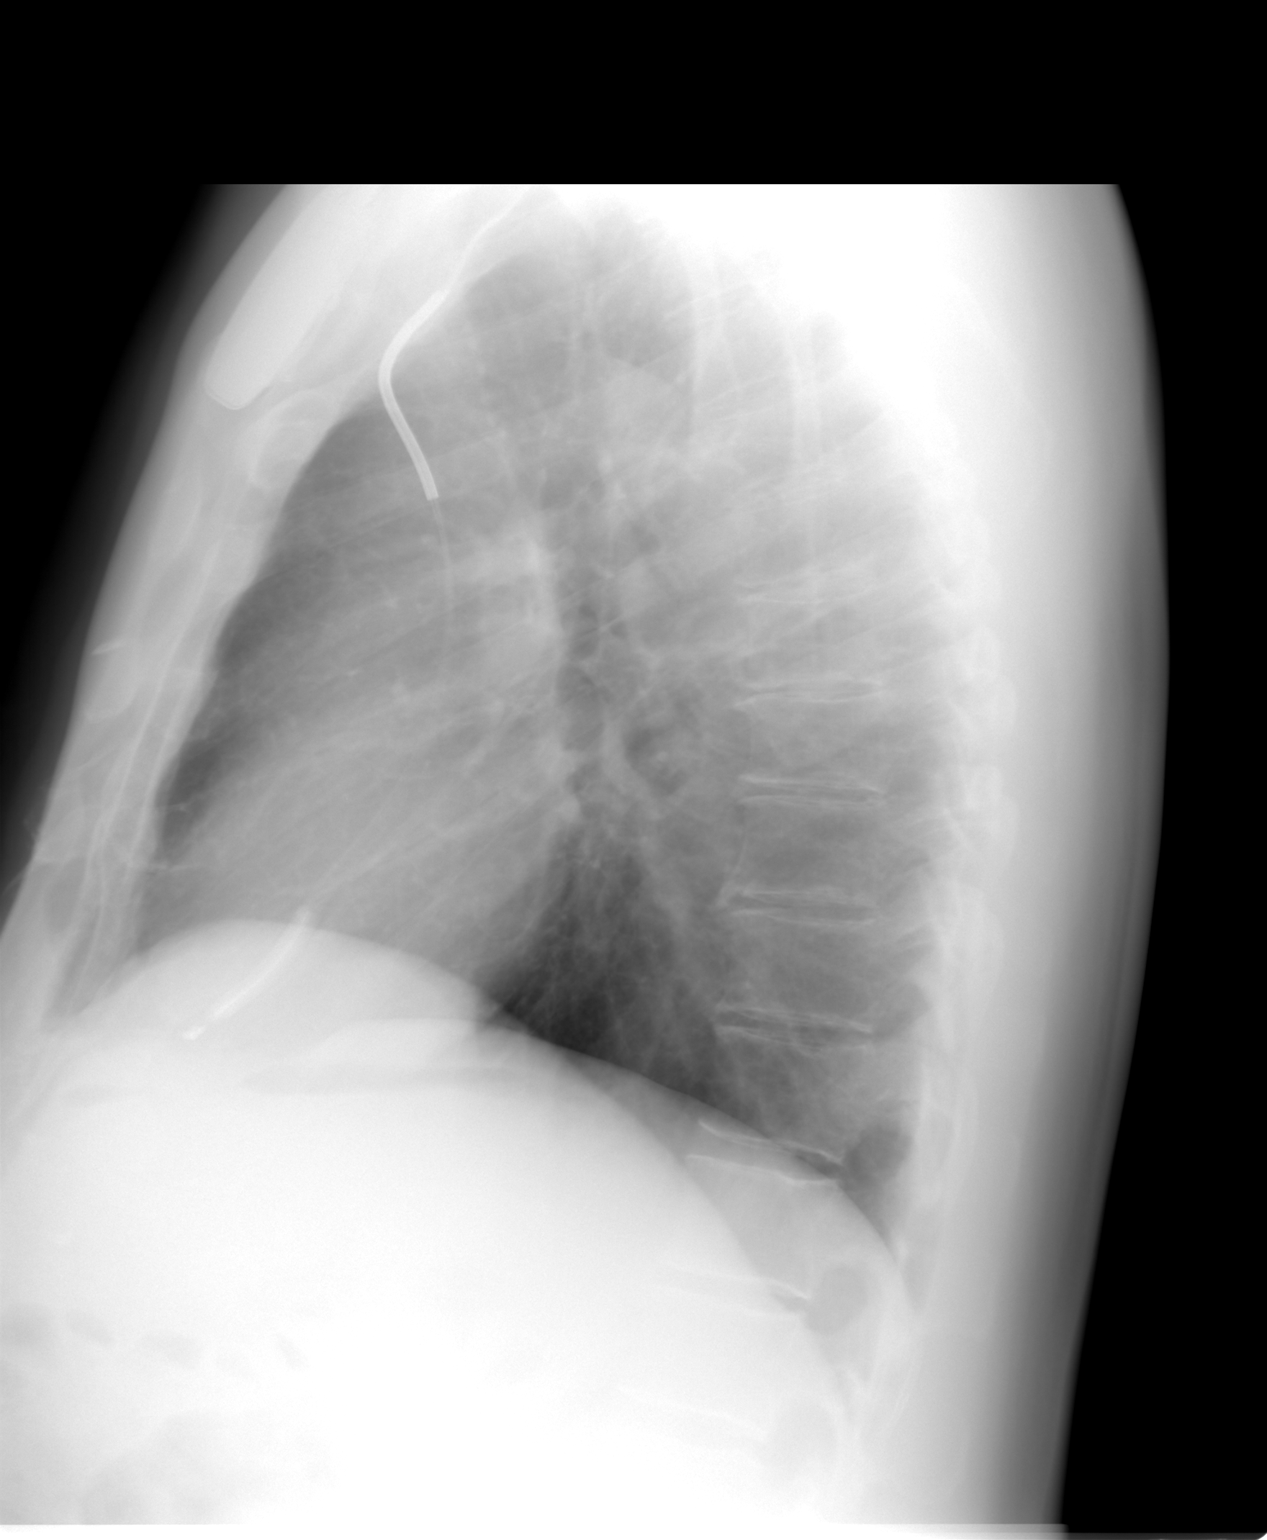

[2 of 2 positions shown; findings below may reference images not displayed]

FINDINGS: Cardiomediastinal silhouette is stable.  Single lead
cardiac pacemaker is unchanged in position.  No acute infiltrate or
pleural effusion.  No pulmonary edema.  Bony thorax is stable.
IMPRESSION: No active disease.  No significant change.

## 2015-04-17 ENCOUNTER — Other Ambulatory Visit: Payer: Self-pay | Admitting: *Deleted

## 2015-04-17 MED ORDER — AMPHETAMINE-DEXTROAMPHETAMINE 10 MG PO TABS: 10.0000 mg | ORAL_TABLET | Freq: Two times a day (BID) | ORAL | Status: DC

## 2015-04-17 NOTE — Telephone Encounter (Signed)
Pt called back and left another voicemail at Triage, pt said that he gets this Rx at a local pharmacy so he just needs Rx printed and picked up so he can take it to a local pharmacy but pt is still requesting a 90 day supply

## 2015-04-17 NOTE — Telephone Encounter (Signed)
Left voicemail letting pt know Rx ready for pick up 

## 2015-04-17 NOTE — Telephone Encounter (Signed)
3 rxs done, for a 90 day supply.  Thanks.

## 2015-04-17 NOTE — Telephone Encounter (Signed)
Pt left voicemail at Triage. Pt is requesting a 90 day supply of Rx so he can fax it to [facility], last refilled on 01/16/15 #60 with 0 refills

## 2015-04-18 ENCOUNTER — Encounter: Payer: Self-pay | Admitting: Family Medicine

## 2015-04-18 DIAGNOSIS — Z79899 Other long term (current) drug therapy: Secondary | ICD-10-CM | POA: Diagnosis not present

## 2015-04-21 ENCOUNTER — Other Ambulatory Visit: Payer: Self-pay

## 2015-04-21 ENCOUNTER — Encounter: Payer: Self-pay | Admitting: Neurology

## 2015-04-21 ENCOUNTER — Ambulatory Visit (INDEPENDENT_AMBULATORY_CARE_PROVIDER_SITE_OTHER): Payer: Medicare Other | Admitting: Neurology

## 2015-04-21 VITALS — BP 135/90 | HR 73 | Ht 69.0 in | Wt 184.0 lb

## 2015-04-21 DIAGNOSIS — G4700 Insomnia, unspecified: Secondary | ICD-10-CM

## 2015-04-21 DIAGNOSIS — G931 Anoxic brain damage, not elsewhere classified: Secondary | ICD-10-CM

## 2015-04-21 DIAGNOSIS — R413 Other amnesia: Secondary | ICD-10-CM

## 2015-04-21 MED ORDER — DONEPEZIL HCL 10 MG PO TABS: 10.0000 mg | ORAL_TABLET | Freq: Every day | ORAL | Status: DC

## 2015-04-21 MED ORDER — MEMANTINE HCL 10 MG PO TABS: 10.0000 mg | ORAL_TABLET | Freq: Two times a day (BID) | ORAL | Status: DC

## 2015-04-21 MED ORDER — CLONAZEPAM 0.125 MG PO TBDP: 0.1250 mg | ORAL_TABLET | Freq: Two times a day (BID) | ORAL | Status: DC | PRN

## 2015-04-21 MED ORDER — MEMANTINE HCL 10 MG PO TABS: ORAL_TABLET | ORAL | Status: DC

## 2015-04-21 NOTE — Progress Notes (Signed)
Chief Complaint  Patient presents with  . Memory Loss    MMSE 26/30 - 13 animals. He is here with his wife, Larry [name] Feels his memory is only mildly better with the addition of Namenda.  Says he is easily agitated on some days.      PATIENT: Larry [name] DOB: 07/23/1950  HISTORICAL  Larry [name] is a very friendly 64 year old right-handed gentleman presents for followup consultation of his memory loss in the context of anoxic brain injury.   He is accompanied by his wife today. He was a patient of Dr. Erling Cruz, felt that the patient was stable but there were problems with his insurance not paying for his medications. The patient has an underlying medical history of cardiac death from ventricular fibrillation with hypoxic brain injury in Jan 1999, due to arrythmia, 15 minutes without heart beat, status post defibrillator placement, He stayed in Hospital for 35 days, required prolonged rehabilitation, has to relearn walking again, as if that he did not have a past.  EEG showed diffuse slowing in January 1999 and CT head as well as brain MRI were normal. CT head repeat in November 2000 showed slight prominence of the lateral and third ventricles. CT head in July 2003 showed prominence of the ventricular system. Psychological testing and August 1999 showed borderline performance IQ. He became depressed. In January 2014 his MMSE was 29, clock drawing was 4, animal fluency was 10. He went to [facility] for 2 years for patient with degenerative disorder.  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 He lives at home with his wife, still driving. He helps the household, he still has difficulty focusing.   He has had some fluctuation in his memory per wife. He has ADD and continues to take Adderall, 10 mg twice a day, without Adderall, he could not remember the pages that he has read,he is also taking Aricept 10 mg every day, which has  been very helpful too.  Up date October 17 2014  His overall doing very well, he likes play computer games, which has helped his eye and hands coordination, he had recent trip to [place], and was overwhelming to him, he continued to drive short distance,  He is taking Ritalin 10 mg twice a day, Aricept 10 mg once a day, complains of insomnia, he took his last dose of Ritalin at evening time.  UPDATE Apr 21 2015: He is now taking Namenda 56m bid and aricept 170mqday, he still has trouble sleeping,  He watch movie, stay up late, he continues to have focus difficulty, tends to become agitated.   REVIEW OF SYSTEMS: Full 14 system review of systems performed and notable only for seizure, confusion, depression  ALLERGIES: Allergies  Allergen Reactions  . Penicillins     Difficult Breathing, rash    HOME MEDICATIONS: Current Outpatient Prescriptions  Medication Sig Dispense Refill  . amphetamine-dextroamphetamine (ADDERALL) 10 MG tablet Take 1 tablet (10 mg total) by mouth 2 (two) times daily. Fill on/after 06/16/15 60 tablet 0  . Ascorbic Acid (VITAMIN C) 500 MG tablet Take 500 mg by mouth daily.      . Marland Kitchenspirin 81 MG tablet Take 81 mg by mouth daily.    . Marland Kitcheniltiazem (CARDIZEM CD) 120 MG 24 hr capsule TAKE 1 CAPSULE DAILY 90 capsule 0  . donepezil (ARICEPT) 10 MG tablet Take 1 tablet (10 mg total) by mouth daily. 90 tablet 3  .  fexofenadine (ALLEGRA) 180 MG tablet Take 180 mg by mouth daily.      . Ginkgo Biloba 40 MG TABS Take by mouth 2 (two) times daily.      . memantine (NAMENDA) 10 MG tablet Held as of 10/31/14    . metoprolol tartrate (LOPRESSOR) 25 MG tablet Take 1 tablet (25 mg total) by mouth 2 (two) times daily. 180 tablet 3  . montelukast (SINGULAIR) 10 MG tablet TAKE 1 TABLET BY MOUTH ONCE A DAY 90 tablet 0  . Multiple Vitamin (MULTIVITAMIN) tablet Take 1 tablet by mouth daily.      . NON FORMULARY Focus factor 1 tab po bid     . rosuvastatin (CRESTOR) 10 MG tablet Take 1  tablet (10 mg total) by mouth at bedtime. 90 tablet 3  . SYNTHROID 50 MCG tablet TAKE 1 TABLET DAILY EXCEPT FOR 1 AND 1/2 TABLETS ON   SUNDAYS AND WEDNESDAYS 105 tablet 1  . vitamin E 400 UNIT capsule Take 400 Units by mouth daily.       No current facility-administered medications for this visit.    PAST MEDICAL HISTORY: Past Medical History  Diagnosis Date  . Cardiac arrest ([place])     aborted  . Depression   . PVC (premature ventricular contraction)     associated with cardiac arrest  . Melanoma in situ of skin of trunk (HCC)     chest  . Headache(784.0)   . Low back pain syndrome   . ADD (attention deficit disorder)   . Dementia     post resusitative  . ICD (implantable cardiac defibrillator) in place   . Allergy   . Idiopathic urticaria   . Sleep apnea   . Hyperlipidemia   . GERD (gastroesophageal reflux disease)   . Tubular adenoma of colon 10/2010  . Diverticulosis   . Internal hemorrhoids   . Cancer ([place])     melanoma  . Hypothyroidism     PAST SURGICAL HISTORY: Past Surgical History  Procedure Laterality Date  . Hemorrhoid surgery    .  implantation x2    . Icd    . Cardiac defibrillator removal  2004    Defibrillator change Caryl Comes)  . Tonsillectomy    . Cardiac defibrillator placement  07/01/2010    Explanation of a previously implanted device, pocket revision, and insertion of a new device, and intraoperative defibrillation threshold testing Caryl Comes)    FAMILY HISTORY: Family History  Problem Relation Age of Onset  . Hypertension Mother   . Heart disease Father   . Diverticulosis Father   . Coronary artery disease Father   . Alopecia Father   . Prostate cancer Brother   . Lung cancer Paternal Grandfather     Smoker  . Cancer Paternal Grandfather     lung cancer - smoker  . Colon cancer Neg Hx   . Diabetes Brother     SOCIAL HISTORY:  Social History   Social History  . Marital Status: Married    Spouse Name: Larry [name]  . Number of Children: 2   . Years of Education: College   Occupational History  .     Social History Main Topics  . Smoking status: Former Smoker -- 1.00 packs/day for 10 years    Quit date: 07/05/1992  . Smokeless tobacco: Former Systems developer    Types: Chew  . Alcohol Use: Yes     Comment: socially  . Drug Use: No  . Sexual Activity: Not on file   Other  Topics Concern  . Not on file   Social History Narrative   Patient is Married Larry [name]) [phone] with 2 daughters   5 grandchildren   Daily caffeine use - one cup of coffee and one- two coke per day   Patient is right-handed.   Patient has a college education.     PHYSICAL EXAM   Filed Vitals:   04/21/15 1040  BP: 135/90  [name]: 73  Height: $Remove'5\' 9"'NAhNHpE$  (1.753 m)  Weight: 184 lb (83.462 kg)    Not recorded      Body mass index is 27.16 kg/(m^2).  PHYSICAL EXAMNIATION:  Gen: NAD, conversant, well nourised, obese, well groomed , anxious looking middle-age male                   Cardiovascular: Regular rate rhythm, no peripheral edema, warm, nontender. Eyes: Conjunctivae clear without exudates or hemorrhage Neck: Supple, no carotid bruise. Pulmonary: Clear to auscultation bilaterally   NEUROLOGICAL EXAM:  MENTAL STATUS:  PHYSICAL EXAMNIATION:  Gen: NAD, conversant, well nourised, obese, well groomed                     Cardiovascular: Regular rate rhythm, no peripheral edema, warm, nontender. Eyes: Conjunctivae clear without exudates or hemorrhage Neck: Supple, no carotid bruise. Pulmonary: Clear to auscultation bilaterally   NEUROLOGICAL EXAM:  MENTAL STATUS: Speech:    Speech is normal; fluent and spontaneous with normal comprehension.  Cognition: MMSE 26/30, animal naming 13     Orientation to time, place and person     Normal recent and remote memory     Attention span and concentration: She has difficulty world spell world backwards     Normal Language, naming, repeating,spontaneous speech     Fund of knowledge   CRANIAL NERVES: CN  II: Visual fields are full to confrontation. Fundoscopic exam is normal with sharp discs and no vascular changes. Pupils are round equal and briskly reactive to light. CN III, IV, VI: extraocular movement are normal. No ptosis. CN V: Facial sensation is intact to pinprick in all 3 divisions bilaterally. Corneal responses are intact.  CN VII: Face is symmetric with normal eye closure and smile. CN VIII: Hearing is normal to rubbing fingers CN IX, X: Palate elevates symmetrically. Phonation is normal. CN XI: Head turning and shoulder shrug are intact CN XII: Tongue is midline with normal movements and no atrophy.  MOTOR: He has mild bilateral upper extremity rigidity, fixation of left upper extremity up on rapid rotating movement,  REFLEXES: Reflexes are 2+ and symmetric at the biceps, triceps, knees, and ankles. Plantar responses are flexor.  SENSORY: Light touch, pinprick, position sense, and vibration sense are intact in fingers and toes.  COORDINATION: Rapid alternating movements and fine finger movements are intact. There is no dysmetria on finger-to-nose and heel-knee-shin.  GAIT/STANCE: Tends to hold his left hand in left elbow flexion, cautious, mildly unsteady  Lab Results  Component Value Date   WBC 12.1* 10/31/2014   HGB 14.6 10/31/2014   HCT 43.0 10/31/2014   MCV 90.7 10/31/2014   PLT 296.0 10/31/2014      Component Value Date/Time   NA 136 10/31/2014 1607   K 4.3 10/31/2014 1607   CL 99 10/31/2014 1607   CO2 29 10/31/2014 1607   GLUCOSE 94 10/31/2014 1607   BUN 11 10/31/2014 1607   CREATININE 0.97 10/31/2014 1607   CALCIUM 9.8 10/31/2014 1607   PROT 7.3 10/31/2014 1607   ALBUMIN 4.2 10/31/2014  1607   AST 67* 10/31/2014 1607   ALT 66* 10/31/2014 1607   ALKPHOS 99 10/31/2014 1607   BILITOT 0.4 10/31/2014 1607   GFRNONAA 95.91 03/12/2010 0841   GFRAA  01/25/2008 1236    >60        The eGFR has been calculated using the MDRD equation. This calculation has  not been validated in all clinical   Lab Results  Component Value Date   CHOL 192 03/05/2014   HDL 50.00 03/05/2014   LDLCALC 121* 02/08/2013   LDLDIRECT 127.6 03/05/2014   TRIG 214.0* 03/05/2014   CHOLHDL 4 03/05/2014   No results found for: HGBA1C No results found for: VITAMINB12 Lab Results  Component Value Date   TSH 5.88* 03/05/2014      ASSESSMENT AND PLAN  ZACARIAH BELUE is a 64 y.o. male with past medical history of anoxic brain injury,  With residual short-term memory trouble, Mini-Mental Status Examination 27 out of 30 today, overall doing very well.  Memory loss, following cardiac arrest, hypoxic brain injury  continue Aricept 10 mg every day, Namenda 10 mg twice a day Insomnia:  Clonazepam 0.111m po qhs. Attention deficit disorder,  Ritalin before 3 PM,   YMarcial Pacas M.D. Ph.D.  [facility] [address] [place] [id number]: [phone]: [phone]

## 2015-04-23 ENCOUNTER — Telehealth: Payer: Self-pay | Admitting: Family Medicine

## 2015-04-23 DIAGNOSIS — E785 Hyperlipidemia, unspecified: Secondary | ICD-10-CM

## 2015-04-23 DIAGNOSIS — I10 Essential (primary) hypertension: Secondary | ICD-10-CM

## 2015-04-23 NOTE — Telephone Encounter (Signed)
Received faxed refill request for   SYNTHROID 50 MCG tablet   TAKE 1 TABLET DAILY EXCEPT FOR 1 AND 1/2 TABLETS ON  SUNDAYS AND WEDNESDAYS  Dispense 105 tablets     Refill   1  Last prescribed on 11/19/2014. Last seen for acute on 10/31/2014. Last TSH was 03/05/2014. No future appointment.

## 2015-04-24 MED ORDER — LEVOTHYROXINE SODIUM 50 MCG PO TABS: ORAL_TABLET | ORAL | Status: DC

## 2015-04-24 NOTE — Telephone Encounter (Signed)
Please schedule CPE for patient when possible. Thanks!

## 2015-04-24 NOTE — Telephone Encounter (Signed)
Sent.  Schedule CPE when possible.  Thanks.

## 2015-04-24 NOTE — Telephone Encounter (Signed)
11/16 labs 11/22 cpx Pt aware  Please close

## 2015-04-30 DIAGNOSIS — H2513 Age-related nuclear cataract, bilateral: Secondary | ICD-10-CM | POA: Diagnosis not present

## 2015-04-30 DIAGNOSIS — H524 Presbyopia: Secondary | ICD-10-CM | POA: Diagnosis not present

## 2015-05-06 ENCOUNTER — Telehealth: Payer: Self-pay | Admitting: Cardiovascular Disease

## 2015-05-06 NOTE — Telephone Encounter (Signed)
[place] paperwork completed By Dr.Cooper LVM on patient machine to return my call.

## 2015-05-08 ENCOUNTER — Encounter: Payer: Medicare Other | Admitting: Internal Medicine

## 2015-05-08 ENCOUNTER — Encounter: Payer: Self-pay | Admitting: Family Medicine

## 2015-05-09 ENCOUNTER — Other Ambulatory Visit: Payer: Self-pay | Admitting: Cardiovascular Disease

## 2015-05-12 ENCOUNTER — Other Ambulatory Visit: Payer: Self-pay | Admitting: *Deleted

## 2015-05-12 MED ORDER — MONTELUKAST SODIUM 10 MG PO TABS: ORAL_TABLET | ORAL | Status: DC

## 2015-05-18 ENCOUNTER — Other Ambulatory Visit: Payer: Self-pay | Admitting: Family Medicine

## 2015-05-18 DIAGNOSIS — E039 Hypothyroidism, unspecified: Secondary | ICD-10-CM

## 2015-05-18 DIAGNOSIS — Z125 Encounter for screening for malignant neoplasm of prostate: Secondary | ICD-10-CM

## 2015-05-18 DIAGNOSIS — E785 Hyperlipidemia, unspecified: Secondary | ICD-10-CM

## 2015-05-19 NOTE — Progress Notes (Signed)
Patient Care Team: [name], MD as PCP - General (Family Medicine)   HPI  Larry [name] is a 64 y.o. 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 He underwent ICD generator replacement in Dec 2011   The patient denies chest pain, shortness of breath, nocturnal dyspnea, orthopnea or peripheral edema. There have been no palpitations, lightheadedness or syncope.      Past Medical History  Diagnosis Date  . Cardiac arrest ([place])     aborted  . Depression   . PVC (premature ventricular contraction)     associated with cardiac arrest  . Melanoma in situ of skin of trunk (HCC)     chest  . Headache(784.0)   . Low back pain syndrome   . ADD (attention deficit disorder)   . Dementia     post resusitative  . ICD (implantable cardiac defibrillator) in place   . Allergy   . Idiopathic urticaria   . Sleep apnea   . Hyperlipidemia   . GERD (gastroesophageal reflux disease)   . Tubular adenoma of colon 10/2010  . Diverticulosis   . Internal hemorrhoids   . Cancer ([place])     melanoma  . Hypothyroidism     Past Surgical History  Procedure Laterality Date  . Hemorrhoid surgery    .  implantation x2    . Icd    . Cardiac defibrillator removal  2004    Defibrillator change Caryl Comes)  . Tonsillectomy    . Cardiac defibrillator placement  07/01/2010    Explanation of a previously implanted device, pocket revision, and insertion of a new device, and intraoperative defibrillation threshold testing Caryl Comes)    Current Outpatient Prescriptions  Medication Sig Dispense Refill  . amphetamine-dextroamphetamine (ADDERALL) 10 MG tablet Take 1 tablet (10 mg total) by mouth 2 (two) times daily. Fill on/after 06/16/15 60 tablet 0  . Ascorbic Acid (VITAMIN C) 500 MG tablet Take 500 mg by mouth daily.      Marland Kitchen aspirin 81 MG tablet Take 81 mg by mouth daily.    . clonazepam (KLONOPIN) 0.125 MG  disintegrating tablet Take 1 tablet (0.125 mg total) by mouth 3 times/day as needed-between meals & bedtime for seizure. 30 tablet 5  . diltiazem (CARDIZEM CD) 120 MG 24 hr capsule Take 1 capsule (120 mg total) by mouth daily. 90 capsule 0  . donepezil (ARICEPT) 10 MG tablet Take 1 tablet (10 mg total) by mouth daily. 90 tablet 3  . fexofenadine (ALLEGRA) 180 MG tablet Take 180 mg by mouth daily.      . Ginkgo Biloba 40 MG TABS Take by mouth 2 (two) times daily.      Marland Kitchen levothyroxine (SYNTHROID) 50 MCG tablet TAKE 1 TABLET DAILY EXCEPT FOR 1 AND 1/2 TABLETS ON   SUNDAYS AND WEDNESDAYS 105 tablet 1  . memantine (NAMENDA) 10 MG tablet Take 1 tablet (10 mg total) by mouth 2 (two) times daily. 180 tablet 3  . metoprolol tartrate (LOPRESSOR) 25 MG tablet Take 1 tablet (25 mg total) by mouth 2 (two) times daily. 180 tablet 3  . montelukast (SINGULAIR) 10 MG tablet TAKE 1 TABLET BY MOUTH ONCE A DAY 90 tablet 0  . Multiple Vitamin (MULTIVITAMIN) tablet Take 1 tablet by mouth daily.      . NON FORMULARY Focus factor 1 tab po bid     . rosuvastatin (CRESTOR) 10 MG tablet Take 1  tablet (10 mg total) by mouth at bedtime. 90 tablet 3  . vitamin E 400 UNIT capsule Take 400 Units by mouth daily.       No current facility-administered medications for this visit.    Allergies  Allergen Reactions  . Penicillins     Difficult Breathing, rash    Review of Systems negative except from HPI and PMH  Physical Exam BP 130/72 mmHg  Pulse 68  Ht 5\' 9"  (1.753 m)  Wt 183 lb 8 oz (83.235 kg)  BMI 27.09 kg/m2  Well developed and nourished in no acute distress HENT normal Neck supple with JVP-flat Clear Device pocket well healed; without hematoma or erythema.  There is no tethering  Regular rate and rhythm, no murmurs or gallops Abd-soft with active BS No Clubbing cyanosis edema Skin-warm and dry A & Oriented  Grossly normal sensory and motor function     Assessment and  Plan Aborted Cardiac  Arrest  Hypertension  Well controlled  On recheck Sys <125  Implantable Defibrillator  The patient's device was interrogated.  The information was reviewed. No changes were made in the programming.

## 2015-05-20 ENCOUNTER — Ambulatory Visit (INDEPENDENT_AMBULATORY_CARE_PROVIDER_SITE_OTHER): Payer: Medicare Other | Admitting: Internal Medicine

## 2015-05-20 ENCOUNTER — Encounter: Payer: Self-pay | Admitting: Internal Medicine

## 2015-05-20 VITALS — BP 130/72 | HR 68 | Ht 69.0 in | Wt 183.5 lb

## 2015-05-20 DIAGNOSIS — I472 Ventricular tachycardia: Secondary | ICD-10-CM | POA: Diagnosis not present

## 2015-05-20 DIAGNOSIS — I4729 Other ventricular tachycardia: Secondary | ICD-10-CM

## 2015-05-20 NOTE — Patient Instructions (Signed)
Medication Instructions: - no changes  Labwork: - none  Procedures/Testing: - none  Follow-Up: - Your physician wants you to follow-up in: 1 year with Dr. Klein You will receive a reminder letter in the mail two months in advance. If you don't receive a letter, please call our office to schedule the follow-up appointment.  Any Additional Special Instructions Will Be Listed Below (If Applicable).   

## 2015-05-21 ENCOUNTER — Other Ambulatory Visit (INDEPENDENT_AMBULATORY_CARE_PROVIDER_SITE_OTHER): Payer: Medicare Other

## 2015-05-21 DIAGNOSIS — E039 Hypothyroidism, unspecified: Secondary | ICD-10-CM

## 2015-05-21 DIAGNOSIS — Z125 Encounter for screening for malignant neoplasm of prostate: Secondary | ICD-10-CM

## 2015-05-21 DIAGNOSIS — E785 Hyperlipidemia, unspecified: Secondary | ICD-10-CM

## 2015-05-21 LAB — COMPREHENSIVE METABOLIC PANEL
ALT: 42 U/L (ref 0–53)
AST: 27 U/L (ref 0–37)
Albumin: 4.4 g/dL (ref 3.5–5.2)
Alkaline Phosphatase: 67 U/L (ref 39–117)
BUN: 12 mg/dL (ref 6–23)
CO2: 31 mEq/L (ref 19–32)
Calcium: 9.8 mg/dL (ref 8.4–10.5)
Chloride: 102 mEq/L (ref 96–112)
Creatinine, Ser: 0.99 mg/dL (ref 0.40–1.50)
GFR: 81.19 mL/min (ref >60.00)
Glucose, Bld: 93 mg/dL (ref 70–99)
Potassium: 4.1 mEq/L (ref 3.5–5.1)
Sodium: 140 mEq/L (ref 135–145)
Total Bilirubin: 0.6 mg/dL (ref 0.2–1.2)
Total Protein: 6.9 g/dL (ref 6.0–8.3)

## 2015-05-21 LAB — LIPID PANEL
Cholesterol: 209 mg/dL — ABNORMAL HIGH (ref 0–200)
HDL: 56.1 mg/dL (ref >39.00)
LDL Cholesterol: 121 mg/dL — ABNORMAL HIGH (ref 0–99)
NonHDL: 152.54
Total CHOL/HDL Ratio: 4
Triglycerides: 156 mg/dL — ABNORMAL HIGH (ref 0.0–149.0)
VLDL: 31.2 mg/dL (ref 0.0–40.0)

## 2015-05-21 LAB — CUP PACEART INCLINIC DEVICE CHECK
Date Time Interrogation Session: 20161116072305
Implantable Lead Implant Date: 19990204
Implantable Lead Location: 753860
Implantable Lead Model: 135
Implantable Lead Serial Number: 301213
Lead Channel Setting Pacing Amplitude: 2.4 V
Lead Channel Setting Pacing Pulse Width: 0.8 ms
Lead Channel Setting Sensing Sensitivity: 0.4 mV
Pulse Gen Serial Number: 277857

## 2015-05-21 LAB — PSA, MEDICARE: PSA: 0.25 ng/ml (ref 0.10–4.00)

## 2015-05-21 LAB — TSH: TSH: 7.59 u[IU]/mL — ABNORMAL HIGH (ref 0.35–4.50)

## 2015-05-23 ENCOUNTER — Encounter: Payer: Self-pay | Admitting: Cardiovascular Disease

## 2015-05-23 ENCOUNTER — Ambulatory Visit (INDEPENDENT_AMBULATORY_CARE_PROVIDER_SITE_OTHER): Payer: Medicare Other | Admitting: Cardiovascular Disease

## 2015-05-23 VITALS — BP 124/64 | HR 66 | Ht 69.0 in | Wt 181.0 lb

## 2015-05-23 DIAGNOSIS — I10 Essential (primary) hypertension: Secondary | ICD-10-CM | POA: Diagnosis not present

## 2015-05-23 DIAGNOSIS — E785 Hyperlipidemia, unspecified: Secondary | ICD-10-CM

## 2015-05-23 MED ORDER — ROSUVASTATIN CALCIUM 20 MG PO TABS: 20.0000 mg | ORAL_TABLET | Freq: Every day | ORAL | Status: DC

## 2015-05-23 NOTE — Patient Instructions (Addendum)
Medication Instructions:  Your physician has recommended you make the following change in your medication:  1. INCREASE Crestor to 20mg  take one tablet by mouth every evening  Labwork: Your physician recommends that you return for a FASTING LIPID and LIVER in 3 MONTHS (08/25/15)--nothing to eat or drink after midnight, lab opens at 7:30 AM  Testing/Procedures: No new orders.   Follow-Up: Your physician wants you to follow-up in: 6 MONTHS with Dr Burt Knack.  You will receive a reminder letter in the mail two months in advance. If you don't receive a letter, please call our office to schedule the follow-up appointment.   Any Other Special Instructions Will Be Listed Below (If Applicable).     If you need a refill on your cardiac medications before your next appointment, please call your pharmacy.

## 2015-05-23 NOTE — Progress Notes (Signed)
Cardiology Office Note Date:  05/25/2015   ID:  Larry [name], Larry [name] September 05, 1950, MRN [id number]  PCP:  Elsie Stain, MD  Cardiologist:  Sherren Mocha, MD    Chief Complaint  Patient presents with  . Hyperlipidemia     History of Present Illness: Larry [name] is a 64 y.o. male who presents for follow-up evaluation. The patient has undergone ICD implantation after remote cardiac arrest. He has also been followed for hypertension and hyperlipidemia.  The patient has no complaints today. Today, he denies symptoms of palpitations, chest pain, shortness of breath, orthopnea, PND, lower extremity edema, dizziness, or syncope. He has not been engaged in exercise. Not following a prudent diet.    Past Medical History  Diagnosis Date  . Cardiac arrest ([place])     aborted  . Depression   . PVC (premature ventricular contraction)     associated with cardiac arrest  . Melanoma in situ of skin of trunk (HCC)     chest  . Headache(784.0)   . Low back pain syndrome   . ADD (attention deficit disorder)   . Dementia     post resusitative  . ICD (implantable cardiac defibrillator) in place   . Allergy   . Idiopathic urticaria   . Sleep apnea   . Hyperlipidemia   . GERD (gastroesophageal reflux disease)   . Tubular adenoma of colon 10/2010  . Diverticulosis   . Internal hemorrhoids   . Cancer ([place])     melanoma  . Hypothyroidism     Past Surgical History  Procedure Laterality Date  . Hemorrhoid surgery    .  implantation x2    . Icd    . Cardiac defibrillator removal  2004    Defibrillator change Caryl Comes)  . Tonsillectomy    . Cardiac defibrillator placement  07/01/2010    Explanation of a previously implanted device, pocket revision, and insertion of a new device, and intraoperative defibrillation threshold testing Caryl Comes)    Current Outpatient Prescriptions  Medication Sig Dispense Refill  . amphetamine-dextroamphetamine (ADDERALL) 10 MG tablet Take 1 tablet (10 mg  total) by mouth 2 (two) times daily. Fill on/after 06/16/15 60 tablet 0  . Ascorbic Acid (VITAMIN C) 500 MG tablet Take 500 mg by mouth daily.      Marland Kitchen aspirin 81 MG tablet Take 81 mg by mouth daily.    . clonazepam (KLONOPIN) 0.125 MG disintegrating tablet Take 1 tablet (0.125 mg total) by mouth 3 times/day as needed-between meals & bedtime for seizure. 30 tablet 5  . diltiazem (CARDIZEM CD) 120 MG 24 hr capsule Take 1 capsule (120 mg total) by mouth daily. 90 capsule 0  . donepezil (ARICEPT) 10 MG tablet Take 1 tablet (10 mg total) by mouth daily. 90 tablet 3  . fexofenadine (ALLEGRA) 180 MG tablet Take 180 mg by mouth daily.      . Ginkgo Biloba 40 MG TABS Take by mouth 2 (two) times daily.      Marland Kitchen levothyroxine (SYNTHROID) 50 MCG tablet TAKE 1 TABLET DAILY EXCEPT FOR 1 AND 1/2 TABLETS ON   SUNDAYS AND WEDNESDAYS 105 tablet 1  . memantine (NAMENDA) 10 MG tablet Take 1 tablet (10 mg total) by mouth 2 (two) times daily. 180 tablet 3  . metoprolol tartrate (LOPRESSOR) 25 MG tablet Take 1 tablet (25 mg total) by mouth 2 (two) times daily. 180 tablet 3  . montelukast (SINGULAIR) 10 MG tablet TAKE 1 TABLET BY MOUTH ONCE A DAY 90 tablet  0  . Multiple Vitamin (MULTIVITAMIN) tablet Take 1 tablet by mouth daily.      . NON FORMULARY Focus factor 1 tab po bid     . rosuvastatin (CRESTOR) 20 MG tablet Take 1 tablet (20 mg total) by mouth at bedtime. 90 tablet 3  . vitamin E 400 UNIT capsule Take 400 Units by mouth daily.       No current facility-administered medications for this visit.    Allergies:   Penicillins   Social History:  The patient  reports that he quit smoking about 22 years ago. He has quit using smokeless tobacco. His smokeless tobacco use included Chew. He reports that he drinks alcohol. He reports that he does not use illicit drugs.   Family History:  The patient's  family history includes Alopecia in his father; Cancer in his paternal grandfather; Coronary artery disease in his  father; Diabetes in his brother; Diverticulosis in his father; Heart disease in his father; Hypertension in his mother; Lung cancer in his paternal grandfather; Prostate cancer in his brother. There is no history of Colon cancer.    ROS:  Please see the history of present illness.   All other systems are reviewed and negative.    PHYSICAL EXAM: VS:  BP 124/64 mmHg  Pulse 66  Ht 5\' 9"  (1.753 m)  Wt 181 lb (82.101 kg)  BMI 26.72 kg/m2  SpO2 96% , BMI Body mass index is 26.72 kg/(m^2). GEN: Well nourished, well developed, in no acute distress HEENT: normal Neck: no JVD, no masses. No carotid bruits Cardiac: RRR without murmur or gallop                Respiratory:  clear to auscultation bilaterally, normal work of breathing GI: soft, nontender, nondistended, + BS MS: no deformity or atrophy Ext: no pretibial edema, pedal pulses 2+= bilaterally Skin: warm and dry, no rash Neuro:  Strength and sensation are intact Psych: euthymic mood, full affect  EKG:  EKG is not ordered today.  Recent Labs: 10/31/2014: Hemoglobin 14.6; Platelets 296.0 05/21/2015: ALT 42; BUN 12; Creatinine, Ser 0.99; Potassium 4.1; Sodium 140; TSH 7.59*   Lipid Panel     Component Value Date/Time   CHOL 209* 05/21/2015 0834   TRIG 156.0* 05/21/2015 0834   HDL 56.10 05/21/2015 0834   CHOLHDL 4 05/21/2015 0834   VLDL 31.2 05/21/2015 0834   LDLCALC 121* 05/21/2015 0834   LDLDIRECT 127.6 03/05/2014 0841      Wt Readings from Last 3 Encounters:  05/23/15 181 lb (82.101 kg)  05/20/15 183 lb 8 oz (83.235 kg)  04/21/15 184 lb (83.462 kg)    ASSESSMENT AND PLAN: 1. Essential HTN: BP controlled on metoprolol and diltiazem.   2. Hyperlipidemia: lipids reviewed and above goal. Recommend increase crestor to 20 mg daily.  Lifestyle modification with increased exercise and dietary modification reviewed with the patient.   3. S/P ICD: followed in device clinic. Sees Dr Caryl Comes.  Current medicines are reviewed with  the patient today.  The patient does not have concerns regarding medicines.  Labs/ tests ordered today include:   Orders Placed This Encounter  Procedures  . Lipid panel  . Hepatic function panel    Disposition:   FU 6 months  Signed, Sherren Mocha, MD  05/25/2015 10:47 PM    [facility] [address], [place], [place]  [address] Phone: [phone]; Fax: [phone]

## 2015-05-27 ENCOUNTER — Ambulatory Visit (INDEPENDENT_AMBULATORY_CARE_PROVIDER_SITE_OTHER): Payer: Medicare Other | Admitting: Family Medicine

## 2015-05-27 ENCOUNTER — Encounter: Payer: Self-pay | Admitting: Family Medicine

## 2015-05-27 VITALS — BP 124/76 | HR 69 | Temp 97.9°F | Ht 69.0 in | Wt 182.5 lb

## 2015-05-27 DIAGNOSIS — E785 Hyperlipidemia, unspecified: Secondary | ICD-10-CM

## 2015-05-27 DIAGNOSIS — E038 Other specified hypothyroidism: Secondary | ICD-10-CM

## 2015-05-27 DIAGNOSIS — Z0000 Encounter for general adult medical examination without abnormal findings: Secondary | ICD-10-CM

## 2015-05-27 DIAGNOSIS — Z23 Encounter for immunization: Secondary | ICD-10-CM

## 2015-05-27 DIAGNOSIS — Z114 Encounter for screening for human immunodeficiency virus [HIV]: Secondary | ICD-10-CM

## 2015-05-27 DIAGNOSIS — Z119 Encounter for screening for infectious and parasitic diseases, unspecified: Secondary | ICD-10-CM

## 2015-05-27 MED ORDER — LEVOTHYROXINE SODIUM 75 MCG PO TABS: ORAL_TABLET | ORAL | Status: DC

## 2015-05-27 NOTE — Patient Instructions (Addendum)
Check with your insurance to see if they will cover the shingles shot. Okay to continue crestor 20mg  a day.  New rx already send by Dr. Burt Knack.  Continue 2x10mg  a day for now to use up what you have.  Recheck lipids in about 2 months per Dr. Burt Knack Change in thyroid dose.  New rx sent.  Recheck thryoid labs in about 2 months with labs from Dr. Burt Knack Take care.  Glad to see you.

## 2015-05-27 NOTE — Progress Notes (Signed)
Pre visit review using our clinic review tool, if applicable. No additional management support is needed unless otherwise documented below in the visit note.  I have personally reviewed the Medicare Annual Wellness questionnaire and have noted 1. The patient's medical and social history 2. Their use of alcohol, tobacco or illicit drugs 3. Their current medications and supplements 4. The patient's functional ability including ADL's, fall risks, home safety risks and hearing or visual             impairment. 5. Diet and physical activities 6. Evidence for depression or mood disorders  The patients weight, height, BMI have been recorded in the chart and visual acuity is per eye clinic.  I have made referrals, counseling and provided education to the patient based review of the above and I have provided the pt with a written personalized care plan for preventive services.  Provider list updated- see scanned forms.  Routine anticipatory guidance given to patient.  See health maintenance.  Flu 2016 Shingles d/w pt PNA due at 35 Tetanus 2010 Colonoscopy 2012 Prostate cancer screening 2016 Advance directive- wife designated if patient were incapacitated.  Cognitive function addressed- see scanned forms- and if abnormal then additional documentation follows.  Pt opts in for HCV and HIV screening.  D/w pt re: routine screening.    Memory per neuro clinic.  Normal testing today. He never filled klonopin rx per neuro, removed from med list.   Elevated Cholesterol: Using medications without problems:yes Muscle aches: no Diet compliance:yes Exercise:yes Labs done on 10mg  crestor daily.  Now already up to 20mg  a day.  This is reasonable, d/w pt.   Hypothyroid.  TSH up.  No neck mass. No dysphagia.  No neck pain.  Labs d/w pt. Compliant with meds.   PMH and SH reviewed  Meds, vitals, and allergies reviewed.   ROS: See HPI.  Otherwise negative.    GEN: nad, alert and oriented HEENT: mucous  membranes moist NECK: supple w/o LA, no tmg CV: rrr. PULM: ctab, no inc wob ABD: soft, +bs EXT: no edema SKIN: no acute rash

## 2015-05-28 NOTE — Assessment & Plan Note (Signed)
Continue with 20mg  a day crestor (labs done at 10mg  a day) already has orders for f/u labs per cards. D/w pt.  He agrees.

## 2015-05-28 NOTE — Assessment & Plan Note (Signed)
Flu 2016 Shingles d/w pt PNA due at 55 Tetanus 2010 Colonoscopy 2012 Prostate cancer screening 2016 Advance directive- wife designated if patient were incapacitated.  Cognitive function addressed- see scanned forms- and if abnormal then additional documentation follows.  Pt opts in for HCV and HIV screening.  D/w pt re: routine screening.

## 2015-05-28 NOTE — Assessment & Plan Note (Signed)
Inc replacement to 58mcg a day, recheck TSH in about 2 months.  He agrees.

## 2015-07-17 ENCOUNTER — Ambulatory Visit: Payer: Medicare Other | Admitting: Cardiovascular Disease

## 2015-07-22 ENCOUNTER — Other Ambulatory Visit: Payer: Self-pay

## 2015-07-22 NOTE — Telephone Encounter (Signed)
Pt left v/m requesting 3 rx for Adderall. Call when ready for pick up. Last printed # 60 x 2 on 04/17/15; last seen 05/27/15.

## 2015-07-23 MED ORDER — AMPHETAMINE-DEXTROAMPHETAMINE 10 MG PO TABS: 10.0000 mg | ORAL_TABLET | Freq: Two times a day (BID) | ORAL | Status: DC

## 2015-07-23 NOTE — Telephone Encounter (Signed)
Pt left v/m requesting cb ASAP when rxs ready for pick up.

## 2015-07-23 NOTE — Telephone Encounter (Signed)
Patient notified by telephone that script is ready for pickup. 

## 2015-07-23 NOTE — Telephone Encounter (Signed)
Printed.  Will sign when I get to clinic.  Thanks.

## 2015-07-30 MED ORDER — AMPHETAMINE-DEXTROAMPHETAMINE 10 MG PO TABS: 10.0000 mg | ORAL_TABLET | Freq: Two times a day (BID) | ORAL | Status: DC

## 2015-07-30 NOTE — Telephone Encounter (Signed)
Patient notified as instructed by telephone and verbalized understanding. Patient stated that he will bring the script from July in and turn it in when he picks these up.

## 2015-07-30 NOTE — Telephone Encounter (Signed)
Went to the front office to place scripts in folder for pickup. Looked thru the folder and found the script from 07/23/15.  Advised Dr. Damita Dunnings of this and it was determined that there must have been a script that was left up front from July and that was given to patient and not the one for 07/23/15. Script shredded that was written back on 07/23/15 per Dr. Damita Dunnings. Called patient and advised his of this and advised him that new scripts are up front ready for pickup.

## 2015-07-30 NOTE — Telephone Encounter (Signed)
I trust this patient.  He does have h/o memory loss.   I don't think there is anything suspicious going on here.  I printed the 3 rxs.  Have him take them directly to the pharmacy and have them hold the 2nd and 3rd one.   Have him bring in the old rx from 01/2015 to shred.  Thanks.

## 2015-07-30 NOTE — Telephone Encounter (Signed)
Pt calling; pt cannot find the 3 Adderall rxs that pt signed for on 07/24/15 at the front desk; I asked pt to do diligent search for these rx since controlled substance and pt said "he has tore the house and car apart looking for the rx and cannot find it." pt did find old rx from 01/2015 for just one rx but not the current rx printed this month. pt request cb.

## 2015-07-30 NOTE — Addendum Note (Signed)
Addended by: [name] on: 07/30/2015 12:39 PM   Modules accepted: Orders

## 2015-07-31 NOTE — Telephone Encounter (Signed)
Old rx dropped off and shredded.  Thanks to all involved.

## 2015-08-08 ENCOUNTER — Other Ambulatory Visit: Payer: Self-pay | Admitting: Family Medicine

## 2015-08-19 ENCOUNTER — Other Ambulatory Visit: Payer: Self-pay | Admitting: Cardiovascular Disease

## 2015-08-25 ENCOUNTER — Other Ambulatory Visit: Payer: Medicare Other

## 2015-09-02 ENCOUNTER — Encounter: Payer: Self-pay | Admitting: Gastroenterology

## 2015-09-09 ENCOUNTER — Encounter: Payer: Self-pay | Admitting: Gastroenterology

## 2015-09-10 ENCOUNTER — Other Ambulatory Visit: Payer: Self-pay | Admitting: Cardiovascular Disease

## 2015-10-20 ENCOUNTER — Ambulatory Visit (INDEPENDENT_AMBULATORY_CARE_PROVIDER_SITE_OTHER): Payer: Medicare Other | Admitting: Neurology

## 2015-10-20 ENCOUNTER — Encounter: Payer: Self-pay | Admitting: Neurology

## 2015-10-20 VITALS — BP 137/94 | HR 65 | Ht 69.0 in | Wt 185.0 lb

## 2015-10-20 DIAGNOSIS — R413 Other amnesia: Secondary | ICD-10-CM | POA: Diagnosis not present

## 2015-10-20 DIAGNOSIS — G931 Anoxic brain damage, not elsewhere classified: Secondary | ICD-10-CM

## 2015-10-20 DIAGNOSIS — G4700 Insomnia, unspecified: Secondary | ICD-10-CM

## 2015-10-20 MED ORDER — DONEPEZIL HCL 10 MG PO TABS: 10.0000 mg | ORAL_TABLET | Freq: Every day | ORAL | Status: DC

## 2015-10-20 MED ORDER — CLONAZEPAM 0.125 MG PO TBDP: 0.1250 mg | ORAL_TABLET | Freq: Every evening | ORAL | Status: DC | PRN

## 2015-10-20 MED ORDER — MEMANTINE HCL 10 MG PO TABS: 10.0000 mg | ORAL_TABLET | Freq: Two times a day (BID) | ORAL | Status: DC

## 2015-10-20 NOTE — Progress Notes (Signed)
Chief Complaint  Patient presents Larry  . Memory [name]    MMSE [name] - 13 animals. He is here Larry his wife, Larry [name]  Feels his memory is about the same.  He has good and bad days.  . Insomnia    He is still having difficulty sleeping.  He never tried the clonazepam 0.139m because he was resistant to taking a controlled medication.  Says he may be willing to try it now.  . ADD    Says his attention is stable Larry his Adderall 162m BID.   Chief Complaint  Patient presents Larry  . Memory [name]    MMSE [name] - 13 animals. He is here Larry his wife, Larry [name] Feels his memory is about the same.  He has good and bad days.  . Insomnia    He is still having difficulty sleeping.  He never tried the clonazepam 0.12561mecause he was resistant to taking a controlled medication.  Says he may be willing to try it now.  . ADD    Says his attention is stable Larry his Adderall 43m36mID.      PATIENT: Larry [name]: [id number]  Larry [name] very friendly 65 y45r old right-handed gentleman presents for followup consultation of his memory [name] in the context of anoxic brain [name]   He is accompanied by his wife [name] He was a patient of Dr. 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  EEG showed diffuse slowing in January 1999, CT head and brain MRI were normal. CT head repeat in November 2000 showed slight prominence of the lateral and third ventricles. CT head in July 2003 showed prominence of the ventricular system. Psychological testing and August 1999 showed borderline performance IQ. He became depressed. In January 2014 his MMSE was 29, clock  drawing was 4, animal fluency was 10. He went to [facility] years for patient Larry degenerative disorder.  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 He lives at home Larry his wife, still driving. He helps the household, he still has difficulty focusing.   He has had some fluctuation in his memory per wife. He has ADD and continues to take Adderall, 10 mg twice a day, without Adderall, he could not remember the pages that he has read,he is also taking Aricept 10 mg every day, which has been very helpful too.  Up date October 17 2014  His Larry doing very well, he likes play computer games, which has helped his eye and hands coordination, [place], he continued to drive short distance,  He is taking Ritalin 10 mg twice a day, Aricept 10 mg once a day, complains of insomnia, he took his last dose of Ritalin at evening time.  UPDATE Apr 21 2015: He is now taking Namenda 43mg19m and aricept 43mg 67m, he still has [name] sleeping,  He watch movie, stay up late, he continues to have focus difficulty, tends to become agitated.  UPDATE April 27th 2017:  He still has chronic insomnia, tends to have irregular sleep pattern, stay up late  watching TV, he still very active during the day, take Adderall 10 mg twice a day, still drives short distance, mild unsteady gait, tends to hold his left arm to his chest   REVIEW OF SYSTEMS: Full 14 system review of systems performed and notable only for: Insomnia, memory [name]  ALLERGIES: Allergies  Allergen Reactions  . Penicillins     Difficult Breathing, rash    HOME MEDICATIONS: Current Outpatient Prescriptions  Medication Sig Dispense Refill  . amphetamine-dextroamphetamine (ADDERALL) 10 MG tablet Take 1 tablet (10 mg total) by mouth 2 (two) times daily. 60 tablet 0  . Ascorbic Acid (VITAMIN C) 500 MG tablet Take 500 mg by mouth  daily.      Marland Kitchen aspirin 81 MG tablet Take 81 mg by mouth daily.    Marland Kitchen diltiazem (CARDIZEM CD) 120 MG 24 hr capsule TAKE 1 CAPSULE DAILY 90 capsule 3  . donepezil (ARICEPT) 10 MG tablet Take 1 tablet (10 mg total) by mouth daily. 90 tablet 3  . fexofenadine (ALLEGRA) 180 MG tablet Take 180 mg by mouth daily.      . Ginkgo Biloba 40 MG TABS Take by mouth 2 (two) times daily.      Marland Kitchen levothyroxine (SYNTHROID, LEVOTHROID) 75 MCG tablet 1 tab a day 90 tablet 3  . memantine (NAMENDA) 10 MG tablet Take 1 tablet (10 mg total) by mouth 2 (two) times daily. 180 tablet 3  . metoprolol tartrate (LOPRESSOR) 25 MG tablet Take 1 tablet (25 mg total) by mouth 2 (two) times daily. 180 tablet 3  . montelukast (SINGULAIR) 10 MG tablet TAKE 1 TABLET ONCE DAILY   (MAKE AN APPOINTMENT FOR   FURTHER REFILLS) 90 tablet 3  . Multiple Vitamin (MULTIVITAMIN) tablet Take 1 tablet by mouth daily.      . NON FORMULARY Focus factor 1 tab po bid     . rosuvastatin (CRESTOR) 20 MG tablet Take 1 tablet (20 mg total) by mouth at bedtime. 90 tablet 3  . vitamin E 400 UNIT capsule Take 400 Units by mouth daily.       No current facility-administered medications for this visit.    PAST MEDICAL HISTORY: Past Medical History  Diagnosis Date  . Cardiac [name] ([place])     aborted  . Depression   . PVC (premature ventricular contraction)     associated Larry cardiac [name]  . Melanoma in situ of skin of trunk (HCC)     chest  . Headache(784.0)   . Low back pain syndrome   . ADD (attention deficit disorder)   . Dementia     post resusitative  . ICD (implantable cardiac defibrillator) in place   . Allergy   . Idiopathic urticaria   . Sleep apnea   . Hyperlipidemia   . GERD (gastroesophageal reflux disease)   . Tubular adenoma of colon 10/2010  . Diverticulosis   . Internal hemorrhoids   . Cancer ([place])     melanoma  . Hypothyroidism     PAST SURGICAL HISTORY: Past Surgical History  Procedure Laterality Date  .  Hemorrhoid surgery    .  implantation x2    . Icd    . Cardiac defibrillator removal  2004    Defibrillator change Larry [name])  . Tonsillectomy    . Cardiac defibrillator placement  07/01/2010    Explanation of a previously implanted device, pocket revision, and insertion of a new device, and intraoperative defibrillation threshold testing Larry [name])    FAMILY HISTORY: Family History  Problem Relation Age of Onset  . Hypertension Mother   . Heart disease Father   . Diverticulosis Father   . Coronary artery disease Father   . Alopecia Father   . Prostate cancer Brother   . Lung cancer Paternal Grandfather     Smoker  . Cancer Paternal Grandfather     lung cancer - smoker  . Colon cancer Neg Hx   . Diabetes Brother   . Heart disease Brother     heart transplant    SOCIAL HISTORY:  Social History   Social History  . Marital Status: Married    Spouse Name: Larry [name]  . Number of Children: 2  . Years of Education: College   Occupational History  .     Social History Main Topics  . Smoking status: Former Smoker -- 1.00 packs/day for 10 years    Quit date: 07/05/1992  . Smokeless tobacco: Former Systems developer    Types: Chew  . Alcohol Use: Yes     Comment: socially  . Drug Use: No  . Sexual Activity: Not on file   Other Topics Concern  . Not on file   Social History Narrative   Patient is married Larry [name]) [phone] Larry 2 daughters   5 grandchildren   Daily caffeine use - one cup of coffee and one-two cokes per day   Patient is right-handed.   Patient has a college education     PHYSICAL EXAM   Filed Vitals:   10/20/15 1004  BP: 137/94  [name]: 67  Height: _0  (1.753 m)  Weight: 185 lb (83.915 kg)    Not recorded      Body mass index is 27.31 kg/(m^2).  PHYSICAL EXAMNIATION:  Gen: NAD, conversant, well nourised, obese, well groomed , anxious looking middle-age male                   Cardiovascular: Regular rate rhythm, no peripheral edema, warm, nontender. Eyes:  Conjunctivae clear without exudates or hemorrhage Neck: Supple, no carotid bruise. Pulmonary: Clear to auscultation bilaterally   NEUROLOGICAL EXAM:  MENTAL STATUS:  PHYSICAL EXAMNIATION:  Gen: NAD, conversant, well nourised, obese, well groomed                     Cardiovascular: Regular rate rhythm, no peripheral edema, warm, nontender. Eyes: Conjunctivae clear without exudates or hemorrhage Neck: Supple, no carotid bruise. Pulmonary: Clear to auscultation bilaterally   NEUROLOGICAL EXAM:  MENTAL STATUS: Speech:    Speech is normal; fluent and spontaneous Larry normal comprehension.  Cognition: MMSE [name], animal naming 13     Orientation to time, place and person, he is not oriented to date     Normal recent and remote memory: Missed one out of 3 recalls     Normal Attention span and concentration:     Normal Language, naming, repeating,spontaneous speech     Fund of knowledge   CRANIAL NERVES: CN II: Visual fields are full to confrontation. Fundoscopic exam is normal Larry sharp discs and no vascular changes. Pupils are round equal and briskly reactive to light. CN III, IV, VI: extraocular movement are normal. No ptosis. CN V: Facial sensation is intact to pinprick in all 3 divisions bilaterally. Corneal responses are intact.  CN VII: Face is symmetric Larry normal eye closure and smile. CN VIII: Hearing is normal to rubbing fingers CN IX, X: Palate elevates symmetrically. Phonation is normal. CN XI: Head turning and shoulder shrug are intact CN XII:  Tongue is midline Larry normal movements and no atrophy.  MOTOR: He has mild bilateral upper extremity rigidity, fixation of left upper extremity up on rapid rotating movement,  REFLEXES: Reflexes are 2+ and symmetric at the biceps, triceps, knees, and ankles. Plantar responses are flexor.  SENSORY: Light touch, pinprick, position sense, and vibration sense are intact in fingers and toes.  COORDINATION: Rapid alternating  movements and fine finger movements are intact. There is no dysmetria on finger-to-nose and heel-knee-shin.  GAIT/STANCE: Tends to hold his left hand in left elbow flexion, cautious, mildly unsteady  Lab Results  Component Value Date   WBC 12.1* 10/31/2014   HGB 14.6 10/31/2014   HCT 43.0 10/31/2014   MCV 90.7 10/31/2014   PLT 296.0 10/31/2014      Component Value Date/Time   NA 140 05/21/2015 0834   K 4.1 05/21/2015 0834   CL 102 05/21/2015 0834   CO2 31 05/21/2015 0834   GLUCOSE 93 05/21/2015 0834   BUN 12 05/21/2015 0834   CREATININE 0.99 05/21/2015 0834   CALCIUM 9.8 05/21/2015 0834   PROT 6.9 05/21/2015 0834   ALBUMIN 4.4 05/21/2015 0834   AST 27 05/21/2015 0834   ALT 42 05/21/2015 0834   ALKPHOS 67 05/21/2015 0834   BILITOT 0.6 05/21/2015 0834   GFRNONAA 95.91 03/12/2010 0841   GFRAA  01/25/2008 1236    >60        The eGFR has been calculated using the MDRD equation. This calculation has not been validated in all clinical   Lab Results  Component Value Date   CHOL 209* 05/21/2015   HDL 56.10 05/21/2015   LDLCALC 121* 05/21/2015   LDLDIRECT 127.6 03/05/2014   TRIG 156.0* 05/21/2015   CHOLHDL 4 05/21/2015   No results found for: HGBA1C No results found for: VITAMINB12 Lab Results  Component Value Date   TSH 7.59* 05/21/2015      ASSESSMENT AND PLAN  KONGMENG SANTORO is a 65 y.o. male Larry past medical history of anoxic brain [name],  Larry [name], Larry [name], Larry [name], Larry [name], Larry [name],Larry [name]   continue Aricept 10 mg every day, Namenda 10 mg twice a day Insomnia:  I have suggested over-the-counter melatonin  Clonazepam 0.174m po qhs.  Attention deficit disorder,  Adderall 10 mg twice a day last dose before  3 PM,   YMarcial Pacas M.D. Ph.D.  [facility] [address]  [place] [address]Ph: [phone]: [phone]

## 2015-10-20 NOTE — Patient Instructions (Addendum)
Melatonin 1-3mg 30 minutes before bed

## 2015-11-04 ENCOUNTER — Ambulatory Visit (AMBULATORY_SURGERY_CENTER): Payer: Self-pay

## 2015-11-04 VITALS — Ht 69.0 in | Wt 184.0 lb

## 2015-11-04 DIAGNOSIS — Z86010 Personal history of colonic polyps: Secondary | ICD-10-CM

## 2015-11-04 NOTE — Progress Notes (Signed)
Per pt, no allergies to soy or egg products.Pt not taking any weight loss meds or using  O2 at home. 

## 2015-11-06 ENCOUNTER — Telehealth: Payer: Self-pay

## 2015-11-06 NOTE — Telephone Encounter (Signed)
Pt left v/m requesting rx for Adderall. Call when ready for pick up. Last printed # 60 on 07/30/15.last annual exam on 05/27/15. Pt wants to pick up rx today,11/06/15.

## 2015-11-07 MED ORDER — AMPHETAMINE-DEXTROAMPHETAMINE 10 MG PO TABS: 10.0000 mg | ORAL_TABLET | Freq: Two times a day (BID) | ORAL | Status: DC

## 2015-11-07 NOTE — Telephone Encounter (Signed)
Left detailed message on voicemail. Rx left at front desk for pick up.  

## 2015-11-07 NOTE — Telephone Encounter (Signed)
Patient asked to be called when rx is ready - [phone].

## 2015-11-07 NOTE — Telephone Encounter (Signed)
Printed.  I didn't get to this until today.  Thanks.

## 2015-11-18 ENCOUNTER — Encounter: Payer: Self-pay | Admitting: Internal Medicine

## 2015-11-18 ENCOUNTER — Ambulatory Visit (INDEPENDENT_AMBULATORY_CARE_PROVIDER_SITE_OTHER): Payer: Medicare Other | Admitting: *Deleted

## 2015-11-18 DIAGNOSIS — Z95810 Presence of automatic (implantable) cardiac defibrillator: Secondary | ICD-10-CM

## 2015-11-18 LAB — CUP PACEART INCLINIC DEVICE CHECK
Brady Statistic RV Percent Paced: 3 %
Date Time Interrogation Session: 20170516040000
HighPow Impedance: 43 Ohm
HighPow Impedance: 63 Ohm
Implantable Lead Implant Date: 19990204
Implantable Lead Location: 753860
Implantable Lead Model: 135
Implantable Lead Serial Number: 301213
Lead Channel Impedance Value: 827 Ohm
Lead Channel Pacing Threshold Amplitude: 1.2 V
Lead Channel Pacing Threshold Pulse Width: 0.8 ms
Lead Channel Sensing Intrinsic Amplitude: 10.4 mV
Lead Channel Setting Pacing Amplitude: 2.4 V
Lead Channel Setting Pacing Pulse Width: 0.8 ms
Lead Channel Setting Sensing Sensitivity: 0.4 mV
Pulse Gen Serial Number: 277857

## 2015-11-18 NOTE — Progress Notes (Signed)
ICD check in clinic. Normal device function. Thresholds and sensing consistent with previous device measurements. Impedance trends stable over time. No evidence of any ventricular arrhythmias. Histogram distribution appropriate for patient and level of activity. No changes made this session. Device programmed at appropriate safety margins. Device programmed to optimize intrinsic conduction. Estimated longevity 8 years. Patient education completed including shock plan. ROV with [place] device clinic in 3 months and with SK/B in 05/2016.

## 2015-11-24 ENCOUNTER — Encounter: Payer: Self-pay | Admitting: Gastroenterology

## 2015-11-24 ENCOUNTER — Ambulatory Visit (AMBULATORY_SURGERY_CENTER): Payer: Medicare Other | Admitting: Gastroenterology

## 2015-11-24 VITALS — BP 108/66 | HR 77 | Temp 97.7°F | Resp 17 | Ht 69.0 in | Wt 184.0 lb

## 2015-11-24 DIAGNOSIS — I10 Essential (primary) hypertension: Secondary | ICD-10-CM | POA: Diagnosis not present

## 2015-11-24 DIAGNOSIS — E039 Hypothyroidism, unspecified: Secondary | ICD-10-CM | POA: Diagnosis not present

## 2015-11-24 DIAGNOSIS — Z86010 Personal history of colonic polyps: Secondary | ICD-10-CM | POA: Diagnosis not present

## 2015-11-24 DIAGNOSIS — E669 Obesity, unspecified: Secondary | ICD-10-CM | POA: Diagnosis not present

## 2015-11-24 DIAGNOSIS — F419 Anxiety disorder, unspecified: Secondary | ICD-10-CM | POA: Diagnosis not present

## 2015-11-24 DIAGNOSIS — F0390 Unspecified dementia without behavioral disturbance: Secondary | ICD-10-CM | POA: Diagnosis not present

## 2015-11-24 DIAGNOSIS — G4733 Obstructive sleep apnea (adult) (pediatric): Secondary | ICD-10-CM | POA: Diagnosis not present

## 2015-11-24 MED ORDER — SODIUM CHLORIDE 0.9 % IV SOLN: 500.0000 mL | INTRAVENOUS | Status: DC

## 2015-11-24 NOTE — Patient Instructions (Signed)
YOU HAD AN ENDOSCOPIC PROCEDURE TODAY AT THE [facility]:   Refer to the procedure report that was given to you for any specific questions about what was found during the examination.  If the procedure report does not answer your questions, please call your gastroenterologist to clarify.  If you requested that your care partner not be given the details of your procedure findings, then the procedure report has been included in a sealed envelope for you to review at your convenience later.  YOU SHOULD EXPECT: Some feelings of bloating in the abdomen. Passage of more gas than usual.  Walking can help get rid of the air that was put into your GI tract during the procedure and reduce the bloating. If you had a lower endoscopy (such as a colonoscopy or flexible sigmoidoscopy) you may notice spotting of blood in your stool or on the toilet paper. If you underwent a bowel prep for your procedure, you may not have a normal bowel movement for a few days.  Please Note:  You might notice some irritation and congestion in your nose or some drainage.  This is from the oxygen used during your procedure.  There is no need for concern and it should clear up in a day or so.  SYMPTOMS TO REPORT IMMEDIATELY:   Following lower endoscopy (colonoscopy or flexible sigmoidoscopy):  Excessive amounts of blood in the stool  Significant tenderness or worsening of abdominal pains  Swelling of the abdomen that is new, acute  Fever of 100F or higher   For urgent or emergent issues, a gastroenterologist can be reached at any hour by calling [phone].   DIET: Your first meal following the procedure should be a small meal and then it is ok to progress to your normal diet. Heavy or fried foods are harder to digest and may make you feel nauseous or bloated.  Likewise, meals heavy in dairy and vegetables can increase bloating.  Drink plenty of fluids but you should avoid alcoholic beverages for 24  hours.  ACTIVITY:  You should plan to take it easy for the rest of today and you should NOT DRIVE or use heavy machinery until tomorrow (because of the sedation medicines used during the test).    FOLLOW UP: Our staff will call the number listed on your records the next business day following your procedure to check on you and address any questions or concerns that you may have regarding the information given to you following your procedure. If we do not reach you, we will leave a message.  However, if you are feeling well and you are not experiencing any problems, there is no need to return our call.  We will assume that you have returned to your regular daily activities without incident.  If any biopsies were taken you will be contacted by phone or by letter within the next 1-3 weeks.  Please call us at [phone] if you have not heard about the biopsies in 3 weeks.    SIGNATURES/CONFIDENTIALITY: You and/or your care partner have signed paperwork which will be entered into your electronic medical record.  These signatures attest to the fact that that the information above on your After Visit Summary has been reviewed and is understood.  Full responsibility of the confidentiality of this discharge information lies with you and/or your care-partner. 

## 2015-11-24 NOTE — Op Note (Signed)
[facility] Patient Name: Larry [name] Procedure Date: 11/24/2015 9:42 AM MRN: [id number] Endoscopist: Ladene Artist , MD Age: 65 Referring MD:  Date of Birth: January 13, 1951 Gender: Male Procedure:                Colonoscopy Indications:              Surveillance: Personal history of adenomatous                            polyps on last colonoscopy > 5 years ago Medicines:                Monitored Anesthesia Care Procedure:                Pre-Anesthesia Assessment:                           - Prior to the procedure, a History and Physical                            was performed, and patient medications and                            allergies were reviewed. The patient's tolerance of                            previous anesthesia was also reviewed. The risks                            and benefits of the procedure and the sedation                            options and risks were discussed with the patient.                            All questions were answered, and informed consent                            was obtained. Prior Anticoagulants: The patient has                            taken no previous anticoagulant or antiplatelet                            agents. ASA Grade Assessment: II - A patient with                            mild systemic disease. After reviewing the risks                            and benefits, the patient was deemed in                            satisfactory condition to undergo the procedure.  After obtaining informed consent, the colonoscope                            was passed under direct vision. Throughout the                            procedure, the patient's blood pressure, pulse, and                            oxygen saturations were monitored continuously. The                            Model PCF-H190L [id number]) scope was introduced                            through the anus and advanced to the the  cecum,                            identified by appendiceal orifice and ileocecal                            valve. The colonoscopy was performed without                            difficulty. The patient tolerated the procedure                            well. The quality of the bowel preparation was                            good. The ileocecal valve, appendiceal orifice, and                            rectum were photographed. Scope In: 9:58:44 AM Scope Out: 10:08:59 AM Scope Withdrawal Time: 0 hours 9 minutes 3 seconds  Total Procedure Duration: 0 hours 10 minutes 15 seconds  Findings:                 The digital rectal exam was normal.                           Multiple small-mouthed diverticula were found in                            the sigmoid colon. There was evidence of                            diverticular spasm. There was no evidence of                            diverticular bleeding.                           Internal hemorrhoids were found during  retroflexion. The hemorrhoids were small and Grade                            I (internal hemorrhoids that do not prolapse).                           The exam was otherwise normal throughout the                            examined colon. Complications:            No immediate complications. Estimated Blood Loss:     Estimated blood loss: none. Impression:               - Moderate diverticulosis in the sigmoid colon.                           - Internal hemorrhoids. Recommendation:           - High fiber diet indefinitely.                           - Continue present medications.                           - Repeat colonoscopy in 5 years for surveillance. Ladene Artist, MD 11/24/2015 10:17:06 AM This report has been signed electronically.

## 2015-11-24 NOTE — Progress Notes (Signed)
Report to PACU, RN, vss, BBS= Clear.  

## 2015-11-25 ENCOUNTER — Telehealth: Payer: Self-pay

## 2015-11-25 NOTE — Telephone Encounter (Signed)
  Follow up Call-  Call back number 11/24/2015  Post procedure Call Back phone  # [phone]  Permission to leave phone message Yes    Patient was called for follow up after his procedure on 0/11/2015. Spoke with the patients wife and she reports that Larry [name] has returned to his normal daily activities without any difficulty.

## 2015-12-08 ENCOUNTER — Other Ambulatory Visit: Payer: Self-pay

## 2015-12-08 MED ORDER — AMPHETAMINE-DEXTROAMPHETAMINE 10 MG PO TABS: 10.0000 mg | ORAL_TABLET | Freq: Two times a day (BID) | ORAL | Status: DC

## 2015-12-08 NOTE — Telephone Encounter (Signed)
Pt left v/m requesting rx for Adderall. Call when ready for pick up. Last printed # 60 on 11/07/15; last annual exam on 05/27/15.

## 2015-12-08 NOTE — Telephone Encounter (Signed)
Printed.  Thanks.  

## 2015-12-09 NOTE — Telephone Encounter (Signed)
Patient advised.  Rx left at front desk for pick up. 

## 2016-01-08 ENCOUNTER — Other Ambulatory Visit: Payer: Self-pay

## 2016-01-08 NOTE — Telephone Encounter (Signed)
Pt left v/m requesting rx for Adderall. Call when ready for pick up. rx last printed # 60 on 12/08/15; last annual exam on 05/27/15.

## 2016-01-09 MED ORDER — AMPHETAMINE-DEXTROAMPHETAMINE 10 MG PO TABS: 10.0000 mg | ORAL_TABLET | Freq: Two times a day (BID) | ORAL | Status: DC

## 2016-01-09 NOTE — Telephone Encounter (Signed)
Wife advised.  Rx left at front desk for pick up.  

## 2016-01-09 NOTE — Telephone Encounter (Signed)
Patient called back to check on his prescription.  Patient said he'll run out over the weekend. Patient said one time he got three prescriptions on one page to cover three months.  Patient wants to know if it can be done that way this time. Patient can be reached at  [phone].

## 2016-01-09 NOTE — Telephone Encounter (Signed)
Printed.  Thanks.  

## 2016-02-17 ENCOUNTER — Encounter (INDEPENDENT_AMBULATORY_CARE_PROVIDER_SITE_OTHER): Payer: Self-pay

## 2016-02-17 ENCOUNTER — Ambulatory Visit (INDEPENDENT_AMBULATORY_CARE_PROVIDER_SITE_OTHER): Payer: Medicare Other | Admitting: *Deleted

## 2016-02-17 ENCOUNTER — Encounter: Payer: Self-pay | Admitting: Internal Medicine

## 2016-02-17 ENCOUNTER — Other Ambulatory Visit: Payer: Self-pay | Admitting: Cardiovascular Disease

## 2016-02-17 DIAGNOSIS — Z95810 Presence of automatic (implantable) cardiac defibrillator: Secondary | ICD-10-CM

## 2016-02-17 DIAGNOSIS — E785 Hyperlipidemia, unspecified: Secondary | ICD-10-CM

## 2016-02-17 DIAGNOSIS — I10 Essential (primary) hypertension: Secondary | ICD-10-CM

## 2016-02-17 LAB — CUP PACEART INCLINIC DEVICE CHECK
Brady Statistic RV Percent Paced: 6 %
Date Time Interrogation Session: 20170815040000
HighPow Impedance: 43 Ohm
HighPow Impedance: 67 Ohm
Implantable Lead Implant Date: 19990204
Implantable Lead Location: 753860
Implantable Lead Model: 135
Implantable Lead Serial Number: 301213
Lead Channel Impedance Value: 761 Ohm
Lead Channel Pacing Threshold Amplitude: 1.3 V
Lead Channel Pacing Threshold Pulse Width: 0.8 ms
Lead Channel Sensing Intrinsic Amplitude: 11 mV
Lead Channel Setting Pacing Amplitude: 2.4 V
Lead Channel Setting Pacing Pulse Width: 0.8 ms
Lead Channel Setting Sensing Sensitivity: 0.4 mV
Pulse Gen Serial Number: 277857

## 2016-02-17 NOTE — Progress Notes (Signed)
ICD check in clinic. Normal device function. Thresholds and sensing consistent with previous device measurements. Impedance trends stable over time. No evidence of any ventricular arrhythmias. Histogram distribution appropriate for patient and level of activity. No changes made this session. Device programmed at appropriate safety margins. Device programmed to optimize intrinsic conduction. Estimated longevity 7.5 years. Patient education completed including shock plan. ROV with SK/B on 05/25/16.

## 2016-04-06 ENCOUNTER — Other Ambulatory Visit: Payer: Self-pay

## 2016-04-06 NOTE — Telephone Encounter (Signed)
Pt left v/m requesting rx for Adderall. Call when ready for pick up. Last printed # 60 on 01/09/16 x 2. Last seen annual 05/27/15 and no future appt scheduled.

## 2016-04-07 MED ORDER — AMPHETAMINE-DEXTROAMPHETAMINE 10 MG PO TABS: 10.0000 mg | ORAL_TABLET | Freq: Two times a day (BID) | ORAL | 0 refills | Status: DC

## 2016-04-07 NOTE — Telephone Encounter (Signed)
Patient notified by telephone that script is up front ready for pickup. Patient stated that he will schedule his CPE when he comes in to pick his script up.

## 2016-04-07 NOTE — Telephone Encounter (Signed)
Schedule CPE when possible.  Thanks.  Printed x3.

## 2016-04-20 ENCOUNTER — Encounter: Payer: Self-pay | Admitting: Neurology

## 2016-04-20 ENCOUNTER — Ambulatory Visit (INDEPENDENT_AMBULATORY_CARE_PROVIDER_SITE_OTHER): Payer: Medicare Other | Admitting: Neurology

## 2016-04-20 VITALS — BP 148/96 | HR 110 | Ht 69.0 in | Wt 185.8 lb

## 2016-04-20 DIAGNOSIS — R413 Other amnesia: Secondary | ICD-10-CM | POA: Diagnosis not present

## 2016-04-20 DIAGNOSIS — G4700 Insomnia, unspecified: Secondary | ICD-10-CM

## 2016-04-20 MED ORDER — MEMANTINE HCL 10 MG PO TABS: 10.0000 mg | ORAL_TABLET | Freq: Two times a day (BID) | ORAL | 4 refills | Status: DC

## 2016-04-20 MED ORDER — DONEPEZIL HCL 10 MG PO TABS: 10.0000 mg | ORAL_TABLET | Freq: Every day | ORAL | 4 refills | Status: DC

## 2016-04-20 NOTE — Progress Notes (Signed)
Chief Complaint  Patient presents with  . Memory Loss    MMSE 26/30 - 12 animals.  He is here with his wife, [name].  Says he has been having more difficulty with memory.   . ADD    Feels Adderall is helpful for his focus.  . 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 He decided not to fill clonazepam because of the listed side efffects.   Chief Complaint  Patient presents with  . Memory Loss    MMSE 26/30 - 12 animals.  He is here with his wife, KJuliann Pulse  Says he has been having more difficulty with memory.   . ADD    Feels Adderall is helpful for his focus.  . Insomnia    Melatonin 547mworked inRetail bankerut it is no longer helpful. He decided not to fill clonazepam because of the listed side efffects.      PATIENT: Larry [name]: [birth year]HISTORICAL  Larry [name] a very friendly [age]ear old right-handed gentleman presents for followup consultation of his memory loss in the context of anoxic brain injury.   He is accompanied by his wife today. He was a patient of Dr. 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  EEG showed diffuse slowing in January 1999, CT head and brain MRI were normal. CT head repeat in November 2000 showed slight prominence of the lateral and third ventricles. CT head in July 2003 showed prominence of the ventricular system. Psychological testing and August 1999 showed borderline performance IQ. He became depressed. In January 2014 his MMSE was 29, clock drawing was 4, animal fluency was 10. [facility].  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 He lives at home with his wife, still driving. He helps the household, he still has difficulty focusing.   He has had some fluctuation in his memory per wife. He has ADD and continues to take Adderall, 10 mg twice a day, without Adderall, he could not remember the pages that he has read,he is also taking Aricept 10 mg every day, which has been very helpful too.  Up date October 17 2014  His overall doing very well, 65, he likes play computer games, which has helped his eye and hands coordination, [place] short distance,  He is taking Ritalin 10 mg twice a day, Aricept 10 mg once a day, complains of insomnia, he took his last dose of Ritalin at evening time.  UPDATE Apr 21 2015: He is now taking Namenda 1065mid and aricept 53m85may, he still has trouble sleeping,  He watch movie, stay up late, he continues to have focus difficulty, tends to become agitated.  UPDATE April 27th 2017:  He still has chronic insomnia, tends to have irregular sleep pattern, stay up late watching TV, he still very active during the day, take Adderall 10 mg twice a day, still drives short distance, mild unsteady gait, tends to hold his left arm to his chest  UPDATE Oct 17th 2017: He is with his wife,  Her daughter was hospital, presented with sudden loss of consciousness yesterday April 19 2016, which has brought back a lot of his memory, he has not tried clonazepam for chronic insomnia, worry about side effect, tried melatonin, still has significant insomnia, he is taking Tylenol pm,    REVIEW OF SYSTEMS: Full 14 system review of systems performed and notable only for: Insomnia, memory loss  ALLERGIES: Allergies  Allergen Reactions  . Penicillins     Difficult Breathing, rash    HOME MEDICATIONS: Current Outpatient Prescriptions  Medication Sig Dispense Refill    . amphetamine-dextroamphetamine (ADDERALL) 10 MG tablet Take 1 tablet (10 mg total) by mouth 2 (two) times daily. Fill on/after 06/06/16 60 tablet 0  . Ascorbic Acid (VITAMIN C) 500 MG tablet Take 500 mg by mouth daily.      Marland Kitchen aspirin 81 MG tablet Take 81 mg by mouth daily.    Marland Kitchen diltiazem (CARDIZEM CD) 120 MG 24 hr capsule TAKE 1 CAPSULE DAILY 90 capsule 3  . donepezil (ARICEPT) 10 MG tablet Take 1 tablet (10 mg total) by mouth daily. 90 tablet 3  . fexofenadine (ALLEGRA) 180 MG tablet Take 180 mg by mouth daily.      . Ginkgo Biloba 40 MG TABS Take by mouth 2 (two) times daily.      Marland Kitchen levothyroxine (SYNTHROID, LEVOTHROID) 75 MCG tablet 1 tab a day 90 tablet 3  . Melatonin 5 MG CAPS Take by mouth at bedtime.    . memantine (NAMENDA) 10 MG tablet Take 1 tablet (10 mg total) by mouth 2 (two) times daily. 180 tablet 3  . metoprolol tartrate (LOPRESSOR) 25 MG tablet Take 1 tablet (25 mg total) by mouth 2 (two) times daily. Please call and schedule a one year follow up appointment with Dr Burt Knack 180 tablet 0  . montelukast (SINGULAIR) 10 MG tablet TAKE 1 TABLET ONCE DAILY   (MAKE AN APPOINTMENT FOR   FURTHER REFILLS) 90 tablet 3  . Multiple Vitamin (MULTIVITAMIN) tablet Take 1 tablet by mouth daily.      . NON FORMULARY Focus factor 1 tab po bid     . rosuvastatin (CRESTOR) 20 MG tablet Take 1 tablet (20 mg total) by mouth at bedtime. 90 tablet 3  . vitamin E 400 UNIT capsule Take 400 Units by mouth daily.       No current facility-administered medications for this visit.     PAST MEDICAL HISTORY: Past Medical History:  Diagnosis Date  . ADD (attention deficit disorder)   . Allergy   . Cancer ([place])    melanoma  . Cardiac arrest ([place]) 07/1997   aborted  . Dementia    post resusitative  . Depression   . Diverticulosis   . GERD (gastroesophageal reflux disease)   . Headache(784.0)   . Hyperlipidemia   . Hypothyroidism   . ICD (implantable cardiac defibrillator) in place   . Idiopathic  urticaria   . Internal hemorrhoids   . Low back pain syndrome   . Melanoma in situ of skin of trunk ([facility])    chest  . PVC (premature ventricular contraction)    associated with cardiac arrest  . Sleep apnea    no c-pap  . Tubular adenoma of colon 10/2010    PAST SURGICAL HISTORY: Past Surgical History:  Procedure Laterality Date  .  implantation x2    . CARDIAC DEFIBRILLATOR PLACEMENT  07/01/2010   Explanation of a  previously implanted device, pocket revision, and insertion of a new device, and intraoperative defibrillation threshold testing Caryl Comes)  . CARDIAC DEFIBRILLATOR REMOVAL  2004   Defibrillator change Caryl Comes) 3 times changed  . HEMORRHOID SURGERY    . ICD    . TONSILLECTOMY      FAMILY HISTORY: Family History  Problem Relation Age of Onset  . Hypertension Mother   . Heart disease Father   . Diverticulosis Father   . Coronary artery disease Father   . Alopecia Father   . Prostate cancer Brother   . Lung cancer Paternal Grandfather     Smoker  . Cancer Paternal Grandfather     lung cancer - smoker  . Colon cancer Neg Hx   . Diabetes Brother   . Heart disease Brother     heart transplant    SOCIAL HISTORY:  Social History   Social History  . Marital status: Married    Spouse name: [name]  . Number of children: 2  . Years of education: College   Occupational History  .  Disabled   Social History Main Topics  . Smoking status: Former Smoker    Packs/day: 1.00    Years: 10.00    Types: Cigarettes    Quit date: 07/06/1995  . Smokeless tobacco: Former Systems developer    Types: Chew    Quit date: 11/04/1994  . Alcohol use No     Comment: socially  . Drug use: No  . Sexual activity: Not on file   Other Topics Concern  . Not on file   Social History Narrative   Patient is married [name]) [phone] with 2 daughters   5 grandchildren   Daily caffeine use - one cup of coffee and one-two cokes per day   Patient is right-handed.   Patient has a college education      PHYSICAL EXAM   Vitals:   04/20/16 0957  BP: (!) 148/96  Pulse: (!) 110  Weight: 185 lb 12 oz (84.3 kg)  Height: _0  (1.753 m)    Not recorded      Body mass index is 27.43 kg/m.  PHYSICAL EXAMNIATION:  Gen: NAD, conversant, well nourised, obese, well groomed , anxious looking middle-age male                   Cardiovascular: Regular rate rhythm, no peripheral edema, warm, nontender. Eyes: Conjunctivae clear without exudates or hemorrhage Neck: Supple, no carotid bruise. Pulmonary: Clear to auscultation bilaterally   NEUROLOGICAL EXAM:  MENTAL STATUS:  PHYSICAL EXAMNIATION:  Gen: NAD, conversant, well nourised, obese, well groomed                     Cardiovascular: Regular rate rhythm, no peripheral edema, warm, nontender. Eyes: Conjunctivae clear without exudates or hemorrhage Neck: Supple, no carotid bruise. Pulmonary: Clear to auscultation bilaterally   NEUROLOGICAL EXAM:  MENTAL STATUS: Speech: Anxious looking middle-age male,    Speech is normal; fluent and spontaneous with normal comprehension.  Cognition: MMSE 26 /30, animal naming 12     Orientation: He is not oriented to date, month     Normal recent and remote memory: Missed 2 out of 3 recalls     Normal Attention span and concentration:     Normal Language, naming, repeating,spontaneous speech     Fund of knowledge   CRANIAL NERVES: CN II: Visual fields are full to confrontation. Fundoscopic exam is normal with sharp discs and no vascular changes. Pupils  are round equal and briskly reactive to light. CN III, IV, VI: extraocular movement are normal. No ptosis. CN V: Facial sensation is intact to pinprick in all 3 divisions bilaterally. Corneal responses are intact.  CN VII: Face is symmetric with normal eye closure and smile. CN VIII: Hearing is normal to rubbing fingers CN IX, X: Palate elevates symmetrically. Phonation is normal. CN XI: Head turning and shoulder shrug are intact CN XII:  Tongue is midline with normal movements and no atrophy.  MOTOR: He has mild bilateral upper extremity rigidity, fixation of left upper extremity up on rapid rotating movement,  REFLEXES: Reflexes are 2+ and symmetric at the biceps, triceps, knees, and ankles. Plantar responses are flexor.  SENSORY: Light touch, pinprick, position sense, and vibration sense are intact in fingers and toes.  COORDINATION: Rapid alternating movements and fine finger movements are intact. There is no dysmetria on finger-to-nose and heel-knee-shin.  GAIT/STANCE: Tends to hold his left hand in left elbow flexion, cautious, mildly unsteady, anxious looking today  Lab Results  Component Value Date   WBC 12.1 (H) 10/31/2014   HGB 14.6 10/31/2014   HCT 43.0 10/31/2014   MCV 90.7 10/31/2014   PLT 296.0 10/31/2014      Component Value Date/Time   NA 140 05/21/2015 0834   K 4.1 05/21/2015 0834   CL 102 05/21/2015 0834   CO2 31 05/21/2015 0834   GLUCOSE 93 05/21/2015 0834   BUN 12 05/21/2015 0834   CREATININE 0.99 05/21/2015 0834   CALCIUM 9.8 05/21/2015 0834   PROT 6.9 05/21/2015 0834   ALBUMIN 4.4 05/21/2015 0834   AST 27 05/21/2015 0834   ALT 42 05/21/2015 0834   ALKPHOS 67 05/21/2015 0834   BILITOT 0.6 05/21/2015 0834   GFRNONAA 95.91 03/12/2010 0841   GFRAA  01/25/2008 1236    >60        The eGFR has been calculated using the MDRD equation. This calculation has not been validated in all clinical   Lab Results  Component Value Date   CHOL 209 (H) 05/21/2015   HDL 56.10 05/21/2015   LDLCALC 121 (H) 05/21/2015   LDLDIRECT 127.6 03/05/2014   TRIG 156.0 (H) 05/21/2015   CHOLHDL 4 05/21/2015   No results found for: HGBA1C No results found for: VITAMINB12 Lab Results  Component Value Date   TSH 7.59 (H) 05/21/2015      ASSESSMENT AND PLAN  ROMONE SHAFF is a 65 y.o. male with past medical history of anoxic brain injury,  With residual short-term memory trouble, Mini-Mental Status  Examination 27 out of 30 today, overall doing very well.   Memory loss, following cardiac arrest, hypoxic brain injury,Mini-Mental Status Examination 28/30   continue Aricept 10 mg every day, Namenda 10 mg twice a day, Refilled his prescription today Insomnia:  I have suggested over-the-counter melatonin  Clonazepam 0.153m po qhs.  Attention deficit disorder,  Adderall 10 mg twice a day last dose before  3 PM,   YMarcial Pacas M.D. Ph.D.  [facility] [address] [place] [id number]: [phone]: [phone]

## 2016-05-06 ENCOUNTER — Other Ambulatory Visit: Payer: Self-pay | Admitting: Neurology

## 2016-05-06 ENCOUNTER — Other Ambulatory Visit: Payer: Self-pay | Admitting: Family Medicine

## 2016-05-13 ENCOUNTER — Telehealth: Payer: Self-pay | Admitting: Neurology

## 2016-05-13 NOTE — Telephone Encounter (Signed)
Pt called to advise he had rec'd a letter from [facility] reg his medications. He called the local [facility] and asked if they could give him information about his medications. He said he does not need any refills and was not aware the local [facility] had requested them. He was advised to call [facility] with any concerns he might have. He was very Public librarian

## 2016-05-24 DIAGNOSIS — L821 Other seborrheic keratosis: Secondary | ICD-10-CM | POA: Diagnosis not present

## 2016-05-24 DIAGNOSIS — Z08 Encounter for follow-up examination after completed treatment for malignant neoplasm: Secondary | ICD-10-CM | POA: Diagnosis not present

## 2016-05-24 DIAGNOSIS — X32XXXD Exposure to sunlight, subsequent encounter: Secondary | ICD-10-CM | POA: Diagnosis not present

## 2016-05-24 DIAGNOSIS — Z85820 Personal history of malignant melanoma of skin: Secondary | ICD-10-CM | POA: Diagnosis not present

## 2016-05-24 DIAGNOSIS — L570 Actinic keratosis: Secondary | ICD-10-CM | POA: Diagnosis not present

## 2016-05-24 DIAGNOSIS — Z1283 Encounter for screening for malignant neoplasm of skin: Secondary | ICD-10-CM | POA: Diagnosis not present

## 2016-05-25 ENCOUNTER — Encounter: Payer: Self-pay | Admitting: Internal Medicine

## 2016-05-25 ENCOUNTER — Ambulatory Visit (INDEPENDENT_AMBULATORY_CARE_PROVIDER_SITE_OTHER): Payer: Medicare Other | Admitting: Internal Medicine

## 2016-05-25 VITALS — BP 126/80 | HR 74 | Ht 69.0 in | Wt 186.5 lb

## 2016-05-25 DIAGNOSIS — Z95810 Presence of automatic (implantable) cardiac defibrillator: Secondary | ICD-10-CM

## 2016-05-25 DIAGNOSIS — I472 Ventricular tachycardia: Secondary | ICD-10-CM | POA: Diagnosis not present

## 2016-05-25 DIAGNOSIS — I469 Cardiac arrest, cause unspecified: Secondary | ICD-10-CM

## 2016-05-25 DIAGNOSIS — I4729 Other ventricular tachycardia: Secondary | ICD-10-CM

## 2016-05-25 DIAGNOSIS — I10 Essential (primary) hypertension: Secondary | ICD-10-CM | POA: Diagnosis not present

## 2016-05-25 NOTE — Patient Instructions (Signed)
Medication Instructions: - Your physician recommends that you continue on your current medications as directed. Please refer to the Current Medication list given to you today.  Labwork: - none ordered  Procedures/Testing: - none ordered  Follow-Up: - Your physician recommends that you schedule a follow-up appointment in: 3 months with [place] physician wants you to follow-up in: 1 year with Dr. Caryl Comes. You will receive a reminder letter in the mail two months in advance. If you don't receive a letter, please call our office to schedule the follow-up appointment.   Any Additional Special Instructions Will Be Listed Below (If Applicable).     If you need a refill on your cardiac medications before your next appointment, please call your pharmacy.

## 2016-05-25 NOTE — Progress Notes (Signed)
Patient Care Team: [name], MD as PCP - General (Family Medicine)   HPI  Larry [name] is a 65 y.o. 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 He underwent ICD generator replacement in Dec 2011   The patient denies chest pain, shortness of breath, nocturnal dyspnea, orthopnea or peripheral edema. There have been no palpitations, lightheadedness or syncope.      Past Medical History:  Diagnosis Date  . ADD (attention deficit disorder)   . Allergy   . Cancer ([place])    melanoma  . Cardiac arrest ([place]) 07/1997   aborted  . Dementia    post resusitative  . Depression   . Diverticulosis   . GERD (gastroesophageal reflux disease)   . Headache(784.0)   . Hyperlipidemia   . Hypothyroidism   . ICD (implantable cardiac defibrillator) in place   . Idiopathic urticaria   . Internal hemorrhoids   . Low back pain syndrome   . Melanoma in situ of skin of trunk (HCC)    chest  . PVC (premature ventricular contraction)    associated with cardiac arrest  . Sleep apnea    no c-pap  . Tubular adenoma of colon 10/2010    Past Surgical History:  Procedure Laterality Date  .  implantation x2    . CARDIAC DEFIBRILLATOR PLACEMENT  07/01/2010   Explanation of a previously implanted device, pocket revision, and insertion of a new device, and intraoperative defibrillation threshold testing Caryl Comes)  . CARDIAC DEFIBRILLATOR REMOVAL  2004   Defibrillator change Caryl Comes) 3 times changed  . HEMORRHOID SURGERY    . ICD    . TONSILLECTOMY      Current Outpatient Prescriptions  Medication Sig Dispense Refill  . amphetamine-dextroamphetamine (ADDERALL) 10 MG tablet Take 1 tablet (10 mg total) by mouth 2 (two) times daily. Fill on/after 06/06/16 60 tablet 0  . Ascorbic Acid (VITAMIN C) 500 MG tablet Take 500 mg by mouth daily.      Marland Kitchen aspirin 81 MG tablet Take 81 mg by mouth daily.    Marland Kitchen diltiazem  (CARDIZEM CD) 120 MG 24 hr capsule TAKE 1 CAPSULE DAILY 90 capsule 3  . donepezil (ARICEPT) 10 MG tablet Take 1 tablet (10 mg total) by mouth daily. 90 tablet 4  . fexofenadine (ALLEGRA) 180 MG tablet Take 180 mg by mouth daily.      . Ginkgo Biloba 40 MG TABS Take by mouth 2 (two) times daily.      . Melatonin 5 MG CAPS Take by mouth at bedtime.    . memantine (NAMENDA) 10 MG tablet Take 1 tablet (10 mg total) by mouth 2 (two) times daily. 180 tablet 4  . metoprolol tartrate (LOPRESSOR) 25 MG tablet Take 1 tablet (25 mg total) by mouth 2 (two) times daily. Please call and schedule a one year follow up appointment with Dr Burt Knack 180 tablet 0  . montelukast (SINGULAIR) 10 MG tablet TAKE 1 TABLET ONCE DAILY   (MAKE AN APPOINTMENT FOR   FURTHER REFILLS) 90 tablet 3  . Multiple Vitamin (MULTIVITAMIN) tablet Take 1 tablet by mouth daily.      . NON FORMULARY Focus factor 1 tab po bid     . rosuvastatin (CRESTOR) 20 MG tablet Take 1 tablet (20 mg total) by mouth at bedtime. 90 tablet 3  . SYNTHROID 75 MCG tablet TAKE 1 TABLET DAILY 90 tablet 0  .  vitamin E 400 UNIT capsule Take 400 Units by mouth daily.       No current facility-administered medications for this visit.     Allergies  Allergen Reactions  . Penicillins     Difficult Breathing, rash    Review of Systems negative except from HPI and PMH  Physical Exam BP 126/80 (BP Location: Left Arm, Patient Position: Sitting, Cuff Size: Normal)   Pulse 74   Ht 5\' 9"  (1.753 m)   Wt 186 lb 8 oz (84.6 kg)   BMI 27.54 kg/m   Well developed and nourished in no acute distress HENT normal Neck supple with JVP-flat Clear Device pocket well healed; without hematoma or erythema.  There is no tethering  Regular rate and rhythm, no murmurs or gallops Abd-soft with active BS No Clubbing cyanosis edema Skin-warm and dry A & Oriented  Grossly normal sensory and motor function     Assessment and  Plan Aborted Cardiac Arrest  Hypertension   Well controlled  On recheck Sys <125  Implantable Defibrillator  The patient's device was interrogated.  The information was reviewed. No changes were made in the programming.

## 2016-05-26 ENCOUNTER — Other Ambulatory Visit (INDEPENDENT_AMBULATORY_CARE_PROVIDER_SITE_OTHER): Payer: Medicare Other

## 2016-05-26 DIAGNOSIS — Z1159 Encounter for screening for other viral diseases: Secondary | ICD-10-CM

## 2016-05-26 DIAGNOSIS — Z114 Encounter for screening for human immunodeficiency virus [HIV]: Secondary | ICD-10-CM | POA: Diagnosis not present

## 2016-05-26 DIAGNOSIS — E038 Other specified hypothyroidism: Secondary | ICD-10-CM

## 2016-05-26 LAB — TSH: TSH: 6.24 u[IU]/mL — ABNORMAL HIGH (ref 0.35–4.50)

## 2016-05-27 ENCOUNTER — Other Ambulatory Visit: Payer: Self-pay | Admitting: Cardiovascular Disease

## 2016-05-27 DIAGNOSIS — E785 Hyperlipidemia, unspecified: Secondary | ICD-10-CM

## 2016-05-27 DIAGNOSIS — I10 Essential (primary) hypertension: Secondary | ICD-10-CM

## 2016-05-27 LAB — HIV ANTIBODY (ROUTINE TESTING W REFLEX): HIV 1&2 Ab, 4th Generation: NONREACTIVE

## 2016-05-27 LAB — HEPATITIS C ANTIBODY: HCV Ab: NEGATIVE

## 2016-06-01 ENCOUNTER — Ambulatory Visit (INDEPENDENT_AMBULATORY_CARE_PROVIDER_SITE_OTHER): Payer: Medicare Other | Admitting: Family Medicine

## 2016-06-01 ENCOUNTER — Encounter: Payer: Self-pay | Admitting: Family Medicine

## 2016-06-01 VITALS — BP 132/72 | HR 71 | Temp 98.1°F | Ht 69.0 in | Wt 188.0 lb

## 2016-06-01 DIAGNOSIS — T7840XS Allergy, unspecified, sequela: Secondary | ICD-10-CM | POA: Diagnosis not present

## 2016-06-01 DIAGNOSIS — E039 Hypothyroidism, unspecified: Secondary | ICD-10-CM | POA: Diagnosis not present

## 2016-06-01 DIAGNOSIS — F988 Other specified behavioral and emotional disorders with onset usually occurring in childhood and adolescence: Secondary | ICD-10-CM | POA: Diagnosis not present

## 2016-06-01 DIAGNOSIS — Z125 Encounter for screening for malignant neoplasm of prostate: Secondary | ICD-10-CM | POA: Diagnosis not present

## 2016-06-01 DIAGNOSIS — Z23 Encounter for immunization: Secondary | ICD-10-CM

## 2016-06-01 DIAGNOSIS — Z0000 Encounter for general adult medical examination without abnormal findings: Secondary | ICD-10-CM | POA: Diagnosis not present

## 2016-06-01 DIAGNOSIS — T7840XA Allergy, unspecified, initial encounter: Secondary | ICD-10-CM | POA: Insufficient documentation

## 2016-06-01 MED ORDER — LEVOTHYROXINE SODIUM 75 MCG PO TABS: 75.0000 ug | ORAL_TABLET | Freq: Every day | ORAL | 3 refills | Status: DC

## 2016-06-01 MED ORDER — MONTELUKAST SODIUM 10 MG PO TABS: ORAL_TABLET | ORAL | 3 refills | Status: DC

## 2016-06-01 NOTE — Progress Notes (Signed)
Pre visit review using our clinic review tool, if applicable. No additional management support is needed unless otherwise documented below in the visit note. 

## 2016-06-01 NOTE — Patient Instructions (Addendum)
Check with your insurance to see if they will cover the shingles shot.  Go to the lab on the way out.  We'll contact you with your lab report.  I forgot to check your PSA with the other labs and I apologize.   Take care.  Glad to see you.  Update me as needed.

## 2016-06-01 NOTE — Progress Notes (Signed)
I have personally reviewed the Medicare Annual Wellness questionnaire and have noted 1. The patient's medical and social history 2. Their use of alcohol, tobacco or illicit drugs 3. Their current medications and supplements 4. The patient's functional ability including ADL's, fall risks, home safety risks and hearing or visual             impairment. 5. Diet and physical activities 6. Evidence for depression or mood disorders  The patients weight, height, BMI have been recorded in the chart and visual acuity is per eye clinic.  I have made referrals, counseling and provided education to the patient based review of the above and I have provided the pt with a written personalized care plan for preventive services.  Provider list updated- see scanned forms.  Routine anticipatory guidance given to patient.  See health maintenance.  Flu 2017 Shingles d/w pt PNA due at 67 Tetanus 2010 Colonoscopy 2017 Prostate cancer screening 2017 Advance directive- wife designated if patient were incapacitated.  Cognitive function addressed- see scanned forms- and if abnormal then additional documentation follows.  HIV and HCV neg, d/w pt.   Lipids per cards. I'll defer.   Hypothyroidism.  Minimal inc in TSH above normal.  No sig change.  No neck mass, no lumps in neck, no dysphagia.  Some fatigue noted but this is at baseline.    H/o OSA.  When he used CPAP prev he didn't feel better and "it drove me crazy."  I don't think he can tolerate this again, based on his history. Discussed with patient. He agrees.   H/o seasonal flares of allergies- controlled with singulair and allegra.  No sx currently, no rhinorrhea.    ADD.  dx'd after hypoxic injury but likely predates the event.  No appetite suppression, no ADE on med.  No tremor.  Sleeping w/o insomnia.    PMH and SH reviewed  Meds, vitals, and allergies reviewed.   ROS: Per HPI.  Unless specifically indicated otherwise in HPI, the patient  denies:  General: fever. Eyes: acute vision changes ENT: sore throat Cardiovascular: chest pain Respiratory: SOB GI: vomiting GU: dysuria Musculoskeletal: acute back pain Derm: acute rash Neuro: acute motor dysfunction Psych: worsening mood Endocrine: polydipsia Heme: bleeding Allergy: hayfever  GEN: nad, alert and oriented HEENT: mucous membranes moist NECK: supple w/o LA CV: rrr. PULM: ctab, no inc wob ABD: soft, +bs EXT: no edema SKIN: no acute rash

## 2016-06-02 ENCOUNTER — Encounter: Payer: Self-pay | Admitting: Family Medicine

## 2016-06-02 DIAGNOSIS — Z8042 Family history of malignant neoplasm of prostate: Secondary | ICD-10-CM | POA: Insufficient documentation

## 2016-06-02 LAB — PSA, MEDICARE: PSA: 0.51 ng/ml (ref 0.10–4.00)

## 2016-06-02 NOTE — Assessment & Plan Note (Signed)
Controlled with current medications. No adverse effect of medications I would continue as is. He agrees.

## 2016-06-02 NOTE — Assessment & Plan Note (Signed)
No thyromegaly on exam. Minimal increase in TSH above goal. Would not need treatment at this point. I don't think he is having any symptoms from this. We can follow periodically. He agrees.

## 2016-06-02 NOTE — Assessment & Plan Note (Signed)
Flu 2017 Shingles d/w pt PNA due at 67 Tetanus 2010 Colonoscopy 2017 Prostate cancer screening 2017 Advance directive- wife designated if patient were incapacitated.  Cognitive function addressed- see scanned forms- and if abnormal then additional documentation follows.  HIV and HCV neg, d/w pt.

## 2016-06-02 NOTE — Assessment & Plan Note (Signed)
In talking with patient he likely had symptoms before his hypoxic event. He clearly had more symptoms after that event. He has improved some on medication. It helps get through the day with improved concentration. No adverse effect on medication and okay to continue as is. He agrees.

## 2016-06-03 ENCOUNTER — Encounter: Payer: Self-pay | Admitting: Internal Medicine

## 2016-06-04 ENCOUNTER — Telehealth: Payer: Self-pay | Admitting: Cardiovascular Disease

## 2016-06-04 ENCOUNTER — Other Ambulatory Visit: Payer: Self-pay | Admitting: *Deleted

## 2016-06-04 MED ORDER — METOPROLOL TARTRATE 25 MG PO TABS: 25.0000 mg | ORAL_TABLET | Freq: Two times a day (BID) | ORAL | 1 refills | Status: DC

## 2016-06-04 NOTE — Telephone Encounter (Signed)
Shana( Film/video editor) is calling to get clarification on Metoprolol Tartrate 25mg  . Please call in reference#[phone]

## 2016-07-02 DIAGNOSIS — H01022 Squamous blepharitis right lower eyelid: Secondary | ICD-10-CM | POA: Diagnosis not present

## 2016-07-02 DIAGNOSIS — H01025 Squamous blepharitis left lower eyelid: Secondary | ICD-10-CM | POA: Diagnosis not present

## 2016-07-02 DIAGNOSIS — H2513 Age-related nuclear cataract, bilateral: Secondary | ICD-10-CM | POA: Diagnosis not present

## 2016-07-02 DIAGNOSIS — H524 Presbyopia: Secondary | ICD-10-CM | POA: Diagnosis not present

## 2016-07-19 ENCOUNTER — Telehealth: Payer: Self-pay | Admitting: Family Medicine

## 2016-07-19 MED ORDER — AMPHETAMINE-DEXTROAMPHETAMINE 10 MG PO TABS: 10.0000 mg | ORAL_TABLET | Freq: Two times a day (BID) | ORAL | 0 refills | Status: DC

## 2016-07-19 NOTE — Telephone Encounter (Signed)
Pt is calling in for a refill of his amphetamine salts.  Can you please call this in and call him when ready.  Thanks.

## 2016-07-19 NOTE — Telephone Encounter (Signed)
Printed.  I'll sign tomorrow when I get to clinic.  Thanks.

## 2016-07-20 NOTE — Telephone Encounter (Signed)
Patient advised.  Rx left at front desk for pick up. 

## 2016-07-22 LAB — CUP PACEART INCLINIC DEVICE CHECK
Brady Statistic RV Percent Paced: 5 %
Date Time Interrogation Session: 20171121050000
HighPow Impedance: 43 Ohm
HighPow Impedance: 43 Ohm
HighPow Impedance: 64 Ohm
HighPow Impedance: 64 Ohm
Implantable Lead Implant Date: 19990204
Implantable Lead Location: 753860
Implantable Lead Model: 135
Implantable Lead Serial Number: 301213
Implantable Pulse Generator Implant Date: 20111228
Lead Channel Impedance Value: 745 Ohm
Lead Channel Impedance Value: 745 Ohm
Lead Channel Pacing Threshold Amplitude: 1.3 V
Lead Channel Pacing Threshold Pulse Width: 0.8 ms
Lead Channel Sensing Intrinsic Amplitude: 10 mV
Lead Channel Setting Pacing Amplitude: 2.4 V
Lead Channel Setting Pacing Pulse Width: 0.8 ms
Lead Channel Setting Sensing Sensitivity: 0.4 mV
Pulse Gen Serial Number: 277857

## 2016-07-27 ENCOUNTER — Other Ambulatory Visit: Payer: Self-pay | Admitting: *Deleted

## 2016-07-27 DIAGNOSIS — R413 Other amnesia: Secondary | ICD-10-CM

## 2016-07-27 MED ORDER — DONEPEZIL HCL 10 MG PO TABS: 10.0000 mg | ORAL_TABLET | Freq: Every day | ORAL | 3 refills | Status: DC

## 2016-07-28 ENCOUNTER — Other Ambulatory Visit: Payer: Self-pay

## 2016-07-28 DIAGNOSIS — I10 Essential (primary) hypertension: Secondary | ICD-10-CM

## 2016-07-28 DIAGNOSIS — E7849 Other hyperlipidemia: Secondary | ICD-10-CM

## 2016-07-28 MED ORDER — ROSUVASTATIN CALCIUM 20 MG PO TABS: 20.0000 mg | ORAL_TABLET | Freq: Every day | ORAL | 3 refills | Status: DC

## 2016-08-31 ENCOUNTER — Ambulatory Visit (INDEPENDENT_AMBULATORY_CARE_PROVIDER_SITE_OTHER): Payer: Medicare Other | Admitting: *Deleted

## 2016-08-31 DIAGNOSIS — I472 Ventricular tachycardia: Secondary | ICD-10-CM | POA: Diagnosis not present

## 2016-08-31 DIAGNOSIS — I4729 Other ventricular tachycardia: Secondary | ICD-10-CM

## 2016-08-31 DIAGNOSIS — I469 Cardiac arrest, cause unspecified: Secondary | ICD-10-CM | POA: Diagnosis not present

## 2016-08-31 DIAGNOSIS — Z95810 Presence of automatic (implantable) cardiac defibrillator: Secondary | ICD-10-CM

## 2016-08-31 LAB — CUP PACEART INCLINIC DEVICE CHECK
Brady Statistic RV Percent Paced: 7 %
Date Time Interrogation Session: 20180227050000
HighPow Impedance: 43 Ohm
HighPow Impedance: 70 Ohm
Implantable Lead Implant Date: 19990204
Implantable Lead Location: 753860
Implantable Lead Model: 135
Implantable Lead Serial Number: 301213
Implantable Pulse Generator Implant Date: 20111228
Lead Channel Impedance Value: 775 Ohm
Lead Channel Pacing Threshold Amplitude: 1.2 V
Lead Channel Pacing Threshold Pulse Width: 0.8 ms
Lead Channel Sensing Intrinsic Amplitude: 11.2 mV
Lead Channel Setting Pacing Amplitude: 2.4 V
Lead Channel Setting Pacing Pulse Width: 0.8 ms
Lead Channel Setting Sensing Sensitivity: 0.4 mV
Pulse Gen Serial Number: 277857

## 2016-08-31 NOTE — Progress Notes (Signed)
ICD check in clinic. Normal device function. Thresholds and sensing consistent with previous device measurements. Impedance trends stable over time. 3 NSVT episodes--longest 4bts. Histogram distribution appropriate for patient and level of activity. No changes made this session. Device programmed at appropriate safety margins. Device programmed to optimize intrinsic conduction. Estimated longevity 7 years. Pt declines remote monitoring, unenrolled from [employer]. Patient education completed including shock plan. ROV with [employer] on 11/30/16 and ROV with [facility] in 05/2017.

## 2016-09-22 ENCOUNTER — Other Ambulatory Visit: Payer: Self-pay

## 2016-09-22 MED ORDER — DILTIAZEM HCL ER COATED BEADS 120 MG PO CP24: 120.0000 mg | ORAL_CAPSULE | Freq: Every day | ORAL | 0 refills | Status: DC

## 2016-09-22 MED ORDER — METOPROLOL TARTRATE 25 MG PO TABS: 25.0000 mg | ORAL_TABLET | Freq: Two times a day (BID) | ORAL | 0 refills | Status: DC

## 2016-10-18 ENCOUNTER — Ambulatory Visit: Payer: Medicare Other | Admitting: Nurse Practitioner

## 2016-10-18 ENCOUNTER — Telehealth: Payer: Self-pay | Admitting: *Deleted

## 2016-10-18 NOTE — Telephone Encounter (Signed)
LVM advising patient's follow up cancelled today due to power outage at office. Left office number and advised patient or wife to please call back today after 12 noon or tomorrow to reschedule FU with Daun Peacock, NP.

## 2016-10-26 ENCOUNTER — Other Ambulatory Visit: Payer: Self-pay

## 2016-10-26 NOTE — Telephone Encounter (Signed)
Pt left v/m requesting rx for Adderall. Call when ready for pick up. Last printed # 60 x 2 on 07/19/16.last annual on 06/01/16.

## 2016-10-27 MED ORDER — AMPHETAMINE-DEXTROAMPHETAMINE 10 MG PO TABS: 10.0000 mg | ORAL_TABLET | Freq: Two times a day (BID) | ORAL | 0 refills | Status: DC

## 2016-10-27 NOTE — Telephone Encounter (Signed)
Printed.  Thanks.  

## 2016-10-27 NOTE — Telephone Encounter (Signed)
Patient notified by telephone that script is up front ready for pickup. 

## 2016-11-01 ENCOUNTER — Other Ambulatory Visit: Payer: Self-pay | Admitting: *Deleted

## 2016-11-01 ENCOUNTER — Telehealth: Payer: Self-pay | Admitting: Neurology

## 2016-11-01 MED ORDER — MEMANTINE HCL 10 MG PO TABS: 10.0000 mg | ORAL_TABLET | Freq: Two times a day (BID) | ORAL | 1 refills | Status: DC

## 2016-11-01 NOTE — Telephone Encounter (Addendum)
Refill sent to requested pharmacy with note for patient to schedule follow up.

## 2016-11-01 NOTE — Telephone Encounter (Signed)
Pamala Hurry from [facility] has called in for a renewal of pt memantine (NAMENDA) 10 MG tablet  they can be reached at [phone] ref#[id number]

## 2016-11-02 NOTE — Telephone Encounter (Signed)
error 

## 2016-11-09 ENCOUNTER — Other Ambulatory Visit: Payer: Self-pay

## 2016-11-09 ENCOUNTER — Other Ambulatory Visit: Payer: Self-pay | Admitting: *Deleted

## 2016-11-09 MED ORDER — LEVOTHYROXINE SODIUM 75 MCG PO TABS: 75.0000 ug | ORAL_TABLET | Freq: Every day | ORAL | 3 refills | Status: DC

## 2016-11-09 MED ORDER — MONTELUKAST SODIUM 10 MG PO TABS: ORAL_TABLET | ORAL | 1 refills | Status: DC

## 2016-11-09 NOTE — Telephone Encounter (Signed)
Pt left v/m [facility]; I spoke with pts wife (DPR signed) and [facility] and will send montelukast sent now. Mrs Savo voiced understanding.

## 2016-11-11 ENCOUNTER — Other Ambulatory Visit: Payer: Self-pay | Admitting: *Deleted

## 2016-11-30 ENCOUNTER — Ambulatory Visit (INDEPENDENT_AMBULATORY_CARE_PROVIDER_SITE_OTHER): Payer: Medicare Other | Admitting: *Deleted

## 2016-11-30 DIAGNOSIS — Z95810 Presence of automatic (implantable) cardiac defibrillator: Secondary | ICD-10-CM

## 2016-11-30 DIAGNOSIS — I472 Ventricular tachycardia: Secondary | ICD-10-CM | POA: Diagnosis not present

## 2016-11-30 DIAGNOSIS — I469 Cardiac arrest, cause unspecified: Secondary | ICD-10-CM | POA: Diagnosis not present

## 2016-11-30 DIAGNOSIS — I4729 Other ventricular tachycardia: Secondary | ICD-10-CM

## 2016-11-30 LAB — CUP PACEART INCLINIC DEVICE CHECK
Brady Statistic RV Percent Paced: 2 %
Date Time Interrogation Session: 20180529040000
HighPow Impedance: 43 Ohm
HighPow Impedance: 66 Ohm
Implantable Lead Implant Date: 19990204
Implantable Lead Location: 753860
Implantable Lead Model: 135
Implantable Lead Serial Number: 301213
Implantable Pulse Generator Implant Date: 20111228
Lead Channel Impedance Value: 735 Ohm
Lead Channel Pacing Threshold Amplitude: 1.3 V
Lead Channel Pacing Threshold Pulse Width: 1 ms
Lead Channel Sensing Intrinsic Amplitude: 10.1 mV
Lead Channel Setting Pacing Amplitude: 2.4 V
Lead Channel Setting Pacing Pulse Width: 1 ms
Lead Channel Setting Sensing Sensitivity: 0.4 mV
Pulse Gen Serial Number: 277857

## 2016-11-30 NOTE — Progress Notes (Signed)
ICD check in clinic. Normal device function. Sensing and impedances consistent with previous device measurements. RV threshold now 1.3V @ 1.19ms. 2 NSVT episodes--longest 3bts. Histogram distribution appropriate for patient and level of activity. Device programmed at appropriate safety margins; RV PW reprogrammed to 1.68ms. Device programmed to optimize intrinsic conduction. Estimated longevity 7 years. Patient declines remote monitoring. Patient education completed including shock plan. ROV with [employer] on 03/01/17 and ROV with [facility] in 05/2017.

## 2016-12-20 ENCOUNTER — Encounter: Payer: Self-pay | Admitting: Nurse Practitioner

## 2016-12-20 ENCOUNTER — Ambulatory Visit (INDEPENDENT_AMBULATORY_CARE_PROVIDER_SITE_OTHER): Payer: Medicare Other | Admitting: Nurse Practitioner

## 2016-12-20 ENCOUNTER — Ambulatory Visit: Payer: Medicare Other | Admitting: Nurse Practitioner

## 2016-12-20 VITALS — BP 142/88 | HR 63 | Ht 69.0 in | Wt 189.6 lb

## 2016-12-20 DIAGNOSIS — G931 Anoxic brain damage, not elsewhere classified: Secondary | ICD-10-CM

## 2016-12-20 DIAGNOSIS — R413 Other amnesia: Secondary | ICD-10-CM

## 2016-12-20 DIAGNOSIS — F988 Other specified behavioral and emotional disorders with onset usually occurring in childhood and adolescence: Secondary | ICD-10-CM | POA: Diagnosis not present

## 2016-12-20 NOTE — Progress Notes (Signed)
[facility]  PATIENT: Larry [name] DOB: [birth year]   REASON FOR VISIT: Follow-up for memory loss  HISTORY FROM: Patient and wife  HISTORY OF PRESENT ILLNESS:[name] is a very friendly 66 year old right-handed gentleman presents for followup consultation of his memory loss in the context of anoxic brain injury.   He is accompanied by his wife today. He was a patient of Dr. 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  EEG showed diffuse slowing in January 1999, CT head and brain MRI were normal. CT head repeat in November 2000 showed slight prominence of the lateral and third ventricles. CT head in July 2003 showed prominence of the ventricular system. Psychological testing and August 1999 showed borderline performance IQ. He became depressed. In January 2014 his MMSE was 29, clock drawing was 4, animal fluency was 10. He went to [facility] for 2 years for patient with degenerative disorder.  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 He lives at home with his wife, still driving. He helps the household, he still has difficulty focusing.   He has had some fluctuation in his memory per wife. He has ADD and continues to take Adderall, 10 mg twice a day, without Adderall, he could not remember the pages that he has read,he is also taking Aricept 10 mg every day, which has been very helpful too.  Up date April 14 2016YY  His overall doing very well, he likes play computer games, which has helped his eye and hands coordination, he had recent trip to [place],  felt overwhelming, he continued to drive short distance,  He is taking Ritalin 10 mg twice a day, Aricept 10 mg once a day, complains of insomnia, he took his last dose of Ritalin at evening time.  UPDATE Apr 21 2015:YY He is now taking Namenda 10mg  bid and aricept 10mg  qday, he still has trouble sleeping,  He watch movie, stay up late, he continues to have focus difficulty, tends to become agitated.  UPDATE April 27th 2017: YY He still has chronic insomnia, tends to have irregular sleep pattern, stay up late watching TV, he still very active during the day, take Adderall 10 mg twice a day, still drives short distance, mild unsteady gait, tends to hold his left arm to his chest  UPDATE Oct 17th 2017:YY He is with his wife,  Her daughter was hospital, presented with sudden loss of consciousness yesterday April 19 2016, which has brought back a lot of his memory, he has not tried clonazepam for chronic insomnia, worry about side effect, tried melatonin, still has significant insomnia, he is taking Tylenol pm,  UPDATE 06/18/2018CM Larry [name], 66 year old male returns for follow-up. He has a history of memory loss in the context of anoxic brain injury. This occurred in Jan 1999 due to arrythmia, 15 minutes without heart beat, status post defibrillator placement. His MMSE today is 29/30., He is currently on Aricept 10 mg daily and Namenda 10 mg twice a day in addition he is on Adderall 10 mg 2 times daily. He takes melatonin for insomnia,  he gets no regular exercise . He  returns for reevaluation    REVIEW OF SYSTEMS: Full 14 system review of systems performed and notable only for those listed, all others are neg:  Constitutional: neg  Cardiovascular: neg Ear/Nose/Throat: neg  Skin: neg Eyes: neg Respiratory: neg Gastroitestinal: neg  Hematology/Lymphatic: neg  Endocrine: neg Musculoskeletal:neg Allergy/Immunology: neg Neurological: Memory loss Psychiatric: neg Sleep :  neg   ALLERGIES: Allergies  Allergen Reactions  . Penicillins     Difficult Breathing, rash    HOME MEDICATIONS: Outpatient Medications Prior to Visit  Medication Sig Dispense Refill  . amphetamine-dextroamphetamine (ADDERALL) 10 MG tablet Take 1 tablet (10 mg total) by mouth 2 (two) times daily. 60 tablet 0  . Ascorbic Acid (VITAMIN C) 500 MG tablet Take 500 mg by mouth daily.      Marland Kitchen aspirin 81 MG tablet Take 81 mg by mouth daily.    Marland Kitchen diltiazem (CARDIZEM CD) 120 MG 24 hr capsule Take 1 capsule (120 mg total) by mouth daily. 90 capsule 0  . donepezil (ARICEPT) 10 MG tablet Take 1 tablet (10 mg total) by mouth daily. 90 tablet 3  . fexofenadine (ALLEGRA) 180 MG tablet Take 180 mg by mouth daily.      . Ginkgo Biloba 40 MG TABS Take by mouth 2 (two) times daily.      Marland Kitchen levothyroxine (SYNTHROID) 75 MCG tablet Take 1 tablet (75 mcg total) by mouth daily. 90 tablet 3  . Melatonin 5 MG CAPS Take by mouth at bedtime.    . memantine (NAMENDA) 10 MG tablet Take 1 tablet (10 mg total) by mouth 2 (two) times daily. Please call [phone] to schedule appt. 180 tablet 1  . metoprolol tartrate (LOPRESSOR) 25 MG tablet Take 1 tablet (25 mg total) by mouth 2 (two) times daily. 180 tablet 0  . montelukast (SINGULAIR) 10 MG tablet TAKE 1 TABLET ONCE DAILY 90 tablet 1  . Multiple Vitamin (MULTIVITAMIN) tablet Take 1 tablet by mouth daily.      . NON FORMULARY Focus factor 1 tab po bid     . rosuvastatin (CRESTOR) 20 MG tablet Take 1 tablet (20 mg total) by mouth at bedtime. 90 tablet 3  . vitamin E 400 UNIT capsule Take 400 Units by mouth daily.      Marland Kitchen amphetamine-dextroamphetamine (ADDERALL) 10 MG tablet Take 1 tablet (10 mg total) by mouth 2 (two) times daily. Fill on/after 11/26/16 60 tablet 0  . amphetamine-dextroamphetamine (ADDERALL) 10 MG tablet Take 1 tablet (10 mg total) by mouth 2 (two) times daily. 60 tablet 0   No facility-administered medications prior to visit.     PAST MEDICAL  HISTORY: Past Medical History:  Diagnosis Date  . ADD (attention deficit disorder)   . Allergy    seasonal flares of perinneal allergies  . Cancer ([place])    melanoma  . Cardiac arrest ([place]) 07/1997   aborted  . Dementia    post resusitative  . Depression   . Diverticulosis   . Family hx of prostate cancer   . GERD (gastroesophageal reflux disease)   . Headache(784.0)   . Hyperlipidemia   . Hypothyroidism   . ICD (implantable cardiac defibrillator) in place   . Idiopathic urticaria   . Internal hemorrhoids   . Low back pain syndrome   . Melanoma in situ of skin of trunk (HCC)    chest  . PVC (premature ventricular contraction)    associated with cardiac arrest  . Sleep apnea    no c-pap  . Tubular  adenoma of colon 10/2010    PAST SURGICAL HISTORY: Past Surgical History:  Procedure Laterality Date  .  implantation x2    . CARDIAC DEFIBRILLATOR PLACEMENT  07/01/2010   Explanation of a previously implanted device, pocket revision, and insertion of a new device, and intraoperative defibrillation threshold testing Caryl Comes)  . CARDIAC DEFIBRILLATOR REMOVAL  2004   Defibrillator change Caryl Comes) 3 times changed  . HEMORRHOID SURGERY    . ICD    . TONSILLECTOMY      FAMILY HISTORY: Family History  Problem Relation Age of Onset  . Hypertension Mother   . Heart disease Father   . Diverticulosis Father   . Coronary artery disease Father   . Alopecia Father   . Prostate cancer Brother   . Lung cancer Paternal Grandfather        Smoker  . Cancer Paternal Grandfather        lung cancer - smoker  . Diabetes Brother   . Heart disease Brother        heart transplant  . Colon cancer Neg Hx     SOCIAL HISTORY: Social History   Social History  . Marital status: Married    Spouse name: [name]  . Number of children: 2  . Years of education: College   Occupational History  .  Disabled   Social History Main Topics  . Smoking status: Former Smoker    Packs/day: 1.00     Years: 10.00    Types: Cigarettes    Quit date: 07/06/1995  . Smokeless tobacco: Former Systems developer    Types: Chew    Quit date: 11/04/1994  . Alcohol use No     Comment: socially  . Drug use: No  . Sexual activity: Not on file   Other Topics Concern  . Not on file   Social History Narrative   Patient is married [name]) [phone] with 2 daughters   5 grandchildren   Daily caffeine use - one cup of coffee and one-two cokes per day   Patient is right-handed.   Patient has a college education     PHYSICAL EXAM  Vitals:   12/20/16 1359  BP: (!) 142/88  Pulse: 63  Weight: 189 lb 9.6 oz (86 kg)  Height: 5\' 9"  (1.753 m)   Body mass index is 28 kg/m.  Generalized: Well developed, in no acute distress  Head: normocephalic and atraumatic,. Oropharynx benign  Neck: Supple, no carotid bruits  Cardiac: Regular rate rhythm, no murmur  Musculoskeletal: No deformity   Neurological examination   Mentation: Alert  MMSE - Mini Mental State Exam 12/20/2016 04/20/2016 10/20/2015  Orientation to time 5 3 4   Orientation to Place 5 5 5   Registration 3 3 3   Attention/ Calculation 5 5 5   Recall 2 1 2   Language- name 2 objects 2 2 2   Language- repeat 1 1 1   Language- follow 3 step command 3 3 3   Language- read & follow direction 1 1 1   Write a sentence 1 1 1   Copy design 1 1 1   Total score 29 26 28   Missed 1 of 3 recall  AFT 9. Clock drawing 4/4 Follows all commands speech and language fluent.   Cranial nerve II-XII: Fundoscopic exam reveals sharp disc margins.Pupils were equal round reactive to light extraocular movements were full, visual field were full on confrontational test. Facial sensation and strength were normal. hearing was intact to finger rubbing bilaterally. Uvula tongue midline. head turning and shoulder shrug were  normal and symmetric.Tongue protrusion into cheek strength was normal. Motor: normal bulk and tone, full strength in the BUE, BLE, fine finger movements normal, no pronator  drift. No focal weakness Sensory: normal and symmetric to light touch, pinprick, and  Vibration, in the upper and lower extremities Coordination: finger-nose-finger, heel-to-shin bilaterally, no dysmetria Reflexes: Symmetric upper and lower plantar responses were flexor bilaterally. Gait and Station: Rising up from seated position without assistance, normal stance,  moderate stride, good arm swing, smooth turning, able to perform tiptoe, and heel walking without difficulty. Tandem gait is steady. No assistive device  DIAGNOSTIC DATA (LABS, IMAGING, TESTING) - I reviewed patient records, labs, notes, testing and imaging myself where available.    Lab Results  Component Value Date   TSH 6.24 (H) 05/26/2016      ASSESSMENT AND PLAN  67 y.o. year old male  has a past medical history of ADD (attention deficit disorder); Anoxic brain injury with residual short-term memory troubles. Memory loss following cardiac arrest.   PLAN: Continue Aricept at 10 mg every day Continue Namenda 10 mg twice a day Continue Adderall 10 mg ordered by her primary care Over-the-counter melatonin for insomnia Follow-up in 6 months Exercise by walking this will also be beneficial for her memory Dennie Bible, [place], [facility], [facility] [address] [place], [place] [address] [phone]

## 2016-12-20 NOTE — Progress Notes (Signed)
I have reviewed and agreed above plan. 

## 2016-12-20 NOTE — Patient Instructions (Signed)
Continue Aricept at 10 mg every day Continue Namenda 10 mg twice a day Continue Adderall 10 mg ordered by her primary care Over-the-counter melatonin for insomnia Follow-up in 6 months Exercises by walking

## 2016-12-21 ENCOUNTER — Telehealth: Payer: Self-pay | Admitting: Cardiovascular Disease

## 2016-12-21 MED ORDER — METOPROLOL TARTRATE 25 MG PO TABS: 25.0000 mg | ORAL_TABLET | Freq: Two times a day (BID) | ORAL | 1 refills | Status: DC

## 2016-12-21 NOTE — Telephone Encounter (Signed)
Follow up      [facility] not [facility]

## 2016-12-21 NOTE — Telephone Encounter (Signed)
Pt's medication was sent to pt's pharmacy as requested. Confirmation received.  °

## 2016-12-21 NOTE — Telephone Encounter (Signed)
New message      *STAT* If patient is at the pharmacy, call can be transferred to refill team.   1. Which medications need to be refilled? (please list name of each medication and dose if known)  metoprolol tartrate (LOPRESSOR) 25 MG tablet Take 1 tablet (25 mg total) by mouth 2 (two) times daily.     2. Which pharmacy/location (including street and city if local pharmacy) is medication to be sent to? [facility]  3. Do they need a 30 day or 90 day supply? [place]

## 2016-12-23 ENCOUNTER — Encounter: Payer: Self-pay | Admitting: Physician Assistant

## 2016-12-23 ENCOUNTER — Ambulatory Visit (INDEPENDENT_AMBULATORY_CARE_PROVIDER_SITE_OTHER): Payer: Medicare Other | Admitting: Physician Assistant

## 2016-12-23 DIAGNOSIS — E039 Hypothyroidism, unspecified: Secondary | ICD-10-CM

## 2016-12-23 DIAGNOSIS — I10 Essential (primary) hypertension: Secondary | ICD-10-CM | POA: Diagnosis not present

## 2016-12-23 DIAGNOSIS — Z95810 Presence of automatic (implantable) cardiac defibrillator: Secondary | ICD-10-CM | POA: Diagnosis not present

## 2016-12-23 DIAGNOSIS — E785 Hyperlipidemia, unspecified: Secondary | ICD-10-CM | POA: Diagnosis not present

## 2016-12-23 NOTE — Progress Notes (Signed)
Cardiology Office Note    Date:  12/22/2016   ID:  Larry, [name] 1951/04/26, MRN [id number]  PCP:  [name], MD  Cardiologist: Dr. Burt Knack EPS Dr. Caryl Comes  Chief Complaint  Patient presents with  . Follow-up    1 year/seen for Dr. Burt Knack    History of Present Illness:  Larry [name] is a 66 y.o. male with history of ventricular fibrillation with hypoxic brain injury in 1999. He underwent ICD with most recent generator change in 2012 followed by Dr. Caryl Comes. Cardiac catheterization at the time of his arrest demonstrated normal coronary arteries. Last echo 2013 LVEF 60%. Last device check 11/30/2016 and had 2 NSVT episodes longest of 3 beats. Device working normally. Patient also has hypertension and HLD, hypothyroidism.  Patient was last seen by Dr. Burt Knack 05/2015 and by Dr. Caryl Comes in 05/2016.Patient comes in accompanied by his wife. He denies any chest pain, palpitations, dyspnea, dyspnea on exertion, dizziness or presyncope. Device check in May was stable. They're getting ready to go to the each with their extended family. He hasn't had cholesterol checked since 2016 and last TSH was slightly elevated November 2017. He does not get any regular exercise.  Past Medical History:  Diagnosis Date  . ADD (attention deficit disorder)   . Allergy    seasonal flares of perinneal allergies  . Cancer ([place])    melanoma  . Cardiac arrest ([place]) 07/1997   aborted  . Dementia    post resusitative  . Depression   . Diverticulosis   . Family hx of prostate cancer   . GERD (gastroesophageal reflux disease)   . Headache(784.0)   . Hyperlipidemia   . Hypothyroidism   . ICD (implantable cardiac defibrillator) in place   . Idiopathic urticaria   . Internal hemorrhoids   . Low back pain syndrome   . Melanoma in situ of skin of trunk (HCC)    chest  . PVC (premature ventricular contraction)    associated with cardiac arrest  . Sleep apnea    no c-pap  . Tubular adenoma of colon  10/2010    Past Surgical History:  Procedure Laterality Date  .  implantation x2    . CARDIAC DEFIBRILLATOR PLACEMENT  07/01/2010   Explanation of a previously implanted device, pocket revision, and insertion of a new device, and intraoperative defibrillation threshold testing Caryl Comes)  . CARDIAC DEFIBRILLATOR REMOVAL  2004   Defibrillator change Caryl Comes) 3 times changed  . HEMORRHOID SURGERY    . ICD    . TONSILLECTOMY      Current Medications: Outpatient Medications Prior to Visit  Medication Sig Dispense Refill  . amphetamine-dextroamphetamine (ADDERALL) 10 MG tablet Take 1 tablet (10 mg total) by mouth 2 (two) times daily. 60 tablet 0  . Ascorbic Acid (VITAMIN C) 500 MG tablet Take 500 mg by mouth daily.      Marland Kitchen aspirin 81 MG tablet Take 81 mg by mouth daily.    Marland Kitchen diltiazem (CARDIZEM CD) 120 MG 24 hr capsule Take 1 capsule (120 mg total) by mouth daily. 90 capsule 0  . donepezil (ARICEPT) 10 MG tablet Take 1 tablet (10 mg total) by mouth daily. 90 tablet 3  . fexofenadine (ALLEGRA) 180 MG tablet Take 180 mg by mouth daily.      . Ginkgo Biloba 40 MG TABS Take by mouth 2 (two) times daily.      Marland Kitchen levothyroxine (SYNTHROID) 75 MCG tablet Take 1 tablet (75 mcg total) by  mouth daily. 90 tablet 3  . Melatonin 5 MG CAPS Take by mouth at bedtime.    . memantine (NAMENDA) 10 MG tablet Take 1 tablet (10 mg total) by mouth 2 (two) times daily. Please call [phone] to schedule appt. 180 tablet 1  . metoprolol tartrate (LOPRESSOR) 25 MG tablet Take 1 tablet (25 mg total) by mouth 2 (two) times daily. 180 tablet 1  . montelukast (SINGULAIR) 10 MG tablet TAKE 1 TABLET ONCE DAILY 90 tablet 1  . Multiple Vitamin (MULTIVITAMIN) tablet Take 1 tablet by mouth daily.      . NON FORMULARY Focus factor 1 tab po bid     . rosuvastatin (CRESTOR) 20 MG tablet Take 1 tablet (20 mg total) by mouth at bedtime. 90 tablet 3  . vitamin E 400 UNIT capsule Take 400 Units by mouth daily.       No  facility-administered medications prior to visit.      Allergies:   Penicillins   Social History   Social History  . Marital status: Married    Spouse name: Larry [name]  . Number of children: 2  . Years of education: College   Occupational History  .  Disabled   Social History Main Topics  . Smoking status: Former Smoker    Packs/day: 1.00    Years: 10.00    Types: Cigarettes    Quit date: 07/06/1995  . Smokeless tobacco: Former Systems developer    Types: Chew    Quit date: 11/04/1994  . Alcohol use No     Comment: socially  . Drug use: No  . Sexual activity: Not Asked   Other Topics Concern  . None   Social History Narrative   Patient is married Larry [name]) [phone] with 2 daughters   5 grandchildren   Daily caffeine use - one cup of coffee and one-two cokes per day   Patient is right-handed.   Patient has a college education     Family History:  The patient's family history includes Alopecia in his father; Cancer in his paternal grandfather; Coronary artery disease in his father; Diabetes in his brother; Diverticulosis in his father; Heart disease in his brother and father; Hypertension in his mother; Lung cancer in his paternal grandfather; Prostate cancer in his brother.   ROS:   Please see the history of present illness.    Review of Systems  Constitution: Negative.  HENT: Negative.   Cardiovascular: Negative.   Respiratory: Negative.   Endocrine: Negative.   Hematologic/Lymphatic: Negative.   Musculoskeletal: Negative.   Gastrointestinal: Negative.   Genitourinary: Negative.   Neurological: Negative.    All other systems reviewed and are negative.   PHYSICAL EXAM:   VS:  BP 128/86   [name] 80   Ht 5\' 9"  (1.753 m)   Wt 187 lb (84.8 kg)   BMI 27.62 kg/m   Physical Exam  GEN: Well nourished, well developed, in no acute distress Neck: no JVD, carotid bruits, or masses Cardiac:RRR; no murmurs, rubs, or gallops  Respiratory:  clear to auscultation bilaterally, normal work of  breathing GI: soft, nontender, nondistended, + BS Ext: without cyanosis, clubbing, or edema, Good distal pulses bilaterally Neuro:  Alert and Oriented x 3 Psych: euthymic mood, full affect  Wt Readings from Last 3 Encounters:  12/07/2016 187 lb (84.8 kg)  12/20/16 189 lb 9.6 oz (86 kg)  06/01/16 188 lb (85.3 kg)      Studies/Labs Reviewed:   EKG:  EKG is  ordered today.  The ekg ordered today demonstrates Normal sinus rhythm, normal EKG  Recent Labs: 05/26/2016: TSH 6.24   Lipid Panel    Component Value Date/Time   CHOL 209 (H) 05/21/2015 0834   TRIG 156.0 (H) 05/21/2015 0834   HDL 56.10 05/21/2015 0834   CHOLHDL 4 05/21/2015 0834   VLDL 31.2 05/21/2015 0834   LDLCALC 121 (H) 05/21/2015 0834   LDLDIRECT 127.6 03/05/2014 0841    Additional studies/ records that were reviewed today include:  2-D echo 2011/10/20 Study Conclusions  - Left ventricle: The cavity size was normal. Wall thickness   was normal. The estimated ejection fraction was 60%. Wall   motion was normal; there were no regional wall motion   abnormalities. - Right ventricle: The cavity size was normal. Pacer wire or   catheter noted in right ventricle. Systolic function was   normal.     ASSESSMENT:    1. SUDDEN DEATH-aborted   2. Implantable cardioverter-defibrillator (ICD) in situ   3. Hyperlipidemia, unspecified hyperlipidemia type   4. Essential hypertension   5. Hypothyroidism, unspecified type      PLAN:  In order of problems listed above:  Sudden death aborted in 19-Oct-1997 status post ICD functioning normally when checked 11/2016. No events. Follow-up with Dr. Caryl Comes in November. Check screening labs CMET and CBC  Hyperlipidemia on Crestor will check fasting lipid panel and LFTs  Essential hypertension well controlled on diltiazem and metoprolol  Hypothyroidism managed by Dr. Damita Dunnings. We'll order TSH with his other labs since it was slightly elevated in November.    Medication Adjustments/Labs  and Tests Ordered: Current medicines are reviewed at length with the patient today.  Concerns regarding medicines are outlined above.  Medication changes, Labs and Tests ordered today are listed in the Patient Instructions below. There are no Patient Instructions on file for this visit.   Sumner Boast, PA-C  12/18/2016 11:47 AM    [facility] [place], [place],   [address] Phone: [phone]; Fax: [phone]

## 2016-12-23 NOTE — Patient Instructions (Addendum)
Medication Instructions:   Your physician recommends that you continue on your current medications as directed. Please refer to the Current Medication list given to you today.   If you need a refill on your cardiac medications before your next appointment, please call your pharmacy.  Labwork:  RETURN TO YOUR PRIMARY CARE DOCTOR FOR LABS   FASTING LIPID  CMET  CBC AND TSH    Testing/Procedures: NONE ORDERED  TODAY    Follow-Up:  IN November WITH DR Caryl Comes   Your physician wants you to follow-up in: [place] will receive a reminder letter in the mail two months in advance. If you don't receive a letter, please call our office to schedule the follow-up appointment.     Any Other Special Instructions Will Be Listed Below (If Applicable).

## 2016-12-30 ENCOUNTER — Other Ambulatory Visit: Payer: Self-pay | Admitting: Cardiovascular Disease

## 2016-12-30 NOTE — Telephone Encounter (Signed)
New message  Patient calling the office for samples of medication:   1.  What medication and dosage are you requesting samples for? Cartia 120mg    2.  Are you currently out of this medication? Per pt has 10 pills left. Pt states he will be going [place] the beach for the weekend and will need the medication before he leaves. Please call back to discuss

## 2016-12-31 ENCOUNTER — Other Ambulatory Visit: Payer: Self-pay | Admitting: *Deleted

## 2016-12-31 ENCOUNTER — Telehealth: Payer: Self-pay | Admitting: Cardiovascular Disease

## 2016-12-31 MED ORDER — DILTIAZEM HCL ER COATED BEADS 120 MG PO CP24: 120.0000 mg | ORAL_CAPSULE | Freq: Every day | ORAL | 0 refills | Status: DC

## 2016-12-31 NOTE — Telephone Encounter (Signed)
Spoke with patient and made him aware that we do not have samples of this medication. He stated that he is waiting on his mail order but he is going to the beach and needs a five day supply sent to his local pharmacy. This has been sent. Patient very appreciative.

## 2016-12-31 NOTE — Telephone Encounter (Signed)
°  Follow Up   Calling to follow up on samples for Diltiazem. Please call.

## 2017-01-02 VITALS — BP 128/86 | HR 78 | Ht 69.0 in | Wt 187.0 lb

## 2017-01-02 DIAGNOSIS — 419620001 Death: Secondary | SNOMED CT | POA: Diagnosis not present

## 2017-01-02 DEATH — deceased

## 2017-01-24 ENCOUNTER — Other Ambulatory Visit: Payer: Self-pay | Admitting: *Deleted

## 2017-01-24 NOTE — Telephone Encounter (Signed)
Last Rx 10/27/2016 x71mos. Last OV 05/2016. Pt is requesting 22mo supply. Last UDS 10/2015. pls advise

## 2017-01-25 MED ORDER — AMPHETAMINE-DEXTROAMPHETAMINE 10 MG PO TABS: 10.0000 mg | ORAL_TABLET | Freq: Two times a day (BID) | ORAL | 0 refills | Status: DC

## 2017-01-25 NOTE — Telephone Encounter (Signed)
Printed x3.  Thanks.

## 2017-01-25 NOTE — Telephone Encounter (Signed)
Patient's wife [name] on [place]) notified by telephone that script is up front ready for pickup.

## 2017-02-24 ENCOUNTER — Telehealth: Payer: Self-pay | Admitting: *Deleted

## 2017-02-24 NOTE — Telephone Encounter (Signed)
LMOM (D[facility]) advising that I need to reschedule patient's [facility] appointment to the afternoon on 03/01/17, or to the following week.  Requested call back to the [facility], gave direct phone number.

## 2017-02-25 NOTE — Telephone Encounter (Signed)
Spoke with patient's wife.  She is agreeable to rescheduling [facility] appointment to 03/01/17 at 12pm.

## 2017-03-01 ENCOUNTER — Ambulatory Visit (INDEPENDENT_AMBULATORY_CARE_PROVIDER_SITE_OTHER): Payer: Medicare Other | Admitting: *Deleted

## 2017-03-01 DIAGNOSIS — Z95810 Presence of automatic (implantable) cardiac defibrillator: Secondary | ICD-10-CM | POA: Diagnosis not present

## 2017-03-01 DIAGNOSIS — I469 Cardiac arrest, cause unspecified: Secondary | ICD-10-CM

## 2017-03-01 DIAGNOSIS — I472 Ventricular tachycardia: Secondary | ICD-10-CM

## 2017-03-01 DIAGNOSIS — I4729 Other ventricular tachycardia: Secondary | ICD-10-CM

## 2017-03-03 NOTE — Progress Notes (Signed)
ICD check in clinic. Normal device function. Thresholds and sensing consistent with previous device measurements. Impedance trends stable over time. No evidence of any ventricular arrhythmias. Histogram distribution appropriate for patient and level of activity. No changes made this session. Device programmed at appropriate safety margins. Device programmed to optimize intrinsic conduction. Estimated longevity 6.5 years. Patient declines remote monitoring. Patient education completed including shock plan. ROV with SK/B on 05/31/17.

## 2017-03-14 LAB — CUP PACEART INCLINIC DEVICE CHECK
Brady Statistic RV Percent Paced: 2 %
Date Time Interrogation Session: 20180828040000
HighPow Impedance: 43 Ohm
HighPow Impedance: 66 Ohm
Implantable Lead Implant Date: 19990204
Implantable Lead Location: 753860
Implantable Lead Model: 135
Implantable Lead Serial Number: 301213
Implantable Pulse Generator Implant Date: 20111228
Lead Channel Impedance Value: 740 Ohm
Lead Channel Pacing Threshold Amplitude: 1.2 V
Lead Channel Pacing Threshold Pulse Width: 1 ms
Lead Channel Sensing Intrinsic Amplitude: 10 mV
Lead Channel Setting Pacing Amplitude: 2.4 V
Lead Channel Setting Pacing Pulse Width: 1 ms
Lead Channel Setting Sensing Sensitivity: 0.4 mV
Pulse Gen Serial Number: 277857

## 2017-03-28 ENCOUNTER — Telehealth: Payer: Self-pay

## 2017-03-28 NOTE — Telephone Encounter (Signed)
Pt called for refill Adderall; while I was on hold pt found there rx given 01/2017 for 03/2017. Nothing further needed.

## 2017-04-13 ENCOUNTER — Other Ambulatory Visit: Payer: Self-pay | Admitting: Neurology

## 2017-04-19 ENCOUNTER — Ambulatory Visit (INDEPENDENT_AMBULATORY_CARE_PROVIDER_SITE_OTHER): Payer: Medicare Other

## 2017-04-19 DIAGNOSIS — Z23 Encounter for immunization: Secondary | ICD-10-CM

## 2017-04-20 ENCOUNTER — Other Ambulatory Visit: Payer: Self-pay | Admitting: Family Medicine

## 2017-05-04 ENCOUNTER — Telehealth: Payer: Self-pay | Admitting: Family Medicine

## 2017-05-04 NOTE — Telephone Encounter (Signed)
Copied from [place] [id number]. Topic: Quick Communication - See Telephone Encounter >> May 04, 2017  4:35 PM Cleaster Corin, NT wrote: CRM for notification. See Telephone encounter for:  05/04/17. Pt. Called and stated that he needs a refill for a 3 month rx on adderall

## 2017-05-05 ENCOUNTER — Other Ambulatory Visit: Payer: Self-pay | Admitting: *Deleted

## 2017-05-05 MED ORDER — AMPHETAMINE-DEXTROAMPHETAMINE 10 MG PO TABS: 10.0000 mg | ORAL_TABLET | Freq: Two times a day (BID) | ORAL | 0 refills | Status: DC

## 2017-05-05 MED ORDER — AMPHETAMINE-DEXTROAMPHETAMINE 10 MG PO TABS
10.0000 mg | ORAL_TABLET | Freq: Two times a day (BID) | ORAL | 0 refills | Status: DC
Start: 2017-05-05 — End: 2017-05-31

## 2017-05-05 NOTE — Telephone Encounter (Signed)
Patient requests 3 mos supply of Adderall.

## 2017-05-06 NOTE — Telephone Encounter (Signed)
Patient advised.  Rx left at front desk for pick up. 

## 2017-05-10 ENCOUNTER — Telehealth: Payer: Self-pay | Admitting: Nurse Practitioner

## 2017-05-10 MED ORDER — MEMANTINE HCL 10 MG PO TABS: 10.0000 mg | ORAL_TABLET | Freq: Two times a day (BID) | ORAL | 0 refills | Status: DC

## 2017-05-10 NOTE — Telephone Encounter (Signed)
Patient requesting refill of memantine (NAMENDA) 10 MG tablet called to [facility]

## 2017-05-10 NOTE — Addendum Note (Signed)
Addended by: Minna Antis on: 05/10/2017 09:52 AM   Modules accepted: Orders

## 2017-05-31 ENCOUNTER — Ambulatory Visit (INDEPENDENT_AMBULATORY_CARE_PROVIDER_SITE_OTHER): Payer: Medicare Other | Admitting: Internal Medicine

## 2017-05-31 ENCOUNTER — Encounter: Payer: Self-pay | Admitting: Internal Medicine

## 2017-05-31 VITALS — BP 114/90 | HR 79 | Ht 69.0 in | Wt 187.5 lb

## 2017-05-31 DIAGNOSIS — I472 Ventricular tachycardia: Secondary | ICD-10-CM | POA: Diagnosis not present

## 2017-05-31 DIAGNOSIS — I4729 Other ventricular tachycardia: Secondary | ICD-10-CM

## 2017-05-31 NOTE — Patient Instructions (Signed)
Medication Instructions:  Your physician recommends that you continue on your current medications as directed. Please refer to the Current Medication list given to you today.   Labwork: none  Testing/Procedures: none  Follow-Up: Your physician recommends that you schedule a follow-up appointment in: 3 months with the device clinic Your physician wants you to follow-up in: 1 year with Dr. Caryl Comes.  You will receive a reminder letter in the mail two months in advance. If you don't receive a letter, please call our office to schedule the follow-up appointment.    Any Other Special Instructions Will Be Listed Below (If Applicable).     If you need a refill on your cardiac medications before your next appointment, please call your pharmacy.

## 2017-05-31 NOTE — Progress Notes (Signed)
Patient Care Team: [name], MD as PCP - General (Family Medicine)   HPI  Larry [name] is a 66 y.o. 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 He underwent ICD generator replacement in Dec 2011   The patient denies chest pain, shortness of breath, nocturnal dyspnea, orthopnea or peripheral edema. There have been no palpitations, lightheadedness or syncope.      Past Medical History:  Diagnosis Date  . ADD (attention deficit disorder)   . Allergy    seasonal flares of perinneal allergies  . Cancer ([place])    melanoma  . Cardiac arrest ([place]) 07/1997   aborted  . Dementia    post resusitative  . Depression   . Diverticulosis   . Family hx of prostate cancer   . GERD (gastroesophageal reflux disease)   . Headache(784.0)   . Hyperlipidemia   . Hypothyroidism   . ICD (implantable cardiac defibrillator) in place   . Idiopathic urticaria   . Internal hemorrhoids   . Low back pain syndrome   . Melanoma in situ of skin of trunk (HCC)    chest  . PVC (premature ventricular contraction)    associated with cardiac arrest  . Sleep apnea    no c-pap  . Tubular adenoma of colon 10/2010    Past Surgical History:  Procedure Laterality Date  .  implantation x2    . CARDIAC DEFIBRILLATOR PLACEMENT  07/01/2010   Explanation of a previously implanted device, pocket revision, and insertion of a new device, and intraoperative defibrillation threshold testing Caryl Comes)  . CARDIAC DEFIBRILLATOR REMOVAL  2004   Defibrillator change Caryl Comes) 3 times changed  . HEMORRHOID SURGERY    . ICD    . TONSILLECTOMY      Current Outpatient Medications  Medication Sig Dispense Refill  . amphetamine-dextroamphetamine (ADDERALL) 10 MG tablet Take 1 tablet (10 mg total) by mouth 2 (two) times daily. Fill on/after 07/04/17 60 tablet 0  . Ascorbic Acid (VITAMIN C) 500 MG tablet Take 500 mg by mouth daily.       Marland Kitchen aspirin 81 MG tablet Take 81 mg by mouth daily.    Marland Kitchen diltiazem (CARTIA XT) 120 MG 24 hr capsule Take 1 capsule (120 mg total) by mouth daily. 5 capsule 0  . donepezil (ARICEPT) 10 MG tablet Take 1 tablet (10 mg total) by mouth daily. 90 tablet 3  . fexofenadine (ALLEGRA) 180 MG tablet Take 180 mg by mouth daily.      . Ginkgo Biloba 40 MG TABS Take by mouth 2 (two) times daily.      Marland Kitchen levothyroxine (SYNTHROID) 75 MCG tablet Take 1 tablet (75 mcg total) by mouth daily. 90 tablet 3  . Melatonin 5 MG CAPS Take by mouth at bedtime.    . memantine (NAMENDA) 10 MG tablet Take 1 tablet (10 mg total) 2 (two) times daily by mouth. Please call [phone] to schedule appt. 180 tablet 0  . metoprolol tartrate (LOPRESSOR) 25 MG tablet Take 1 tablet (25 mg total) by mouth 2 (two) times daily. 180 tablet 1  . montelukast (SINGULAIR) 10 MG tablet TAKE 1 TABLET DAILY 30 tablet 0  . Multiple Vitamin (MULTIVITAMIN) tablet Take 1 tablet by mouth daily.      . NON FORMULARY Focus factor 1 tab po bid     . rosuvastatin (CRESTOR) 20 MG tablet Take 1 tablet (20 mg total) by  mouth at bedtime. 90 tablet 3  . vitamin E 400 UNIT capsule Take 400 Units by mouth daily.       No current facility-administered medications for this visit.     Allergies  Allergen Reactions  . Penicillins     Difficult Breathing, rash    Review of Systems negative except from HPI and PMH  Physical Exam BP 114/90 (BP Location: Left Arm, Patient Position: Sitting, Cuff Size: Normal)   Pulse 79   Ht 5\' 9"  (1.753 m)   Wt 187 lb 8 oz (85 kg)   BMI 27.69 kg/m   Well developed and nourished in no acute distress HENT normal Neck supple with JVP-flat Clear Device pocket well healed; without hematoma or erythema.  There is no tethering  Regular rate and rhythm, no murmurs or gallops Abd-soft with active BS No Clubbing cyanosis edema Skin-warm and dry A & Oriented  Grossly normal sensory and motor function     Assessment and   Plan Aborted Cardiac Arrest  Hypertension  Well controlled  On recheck Sys <125  Implantable Defibrillator  The patient's device was interrogated.  The information was reviewed. No changes were made in the programming.

## 2017-06-02 ENCOUNTER — Other Ambulatory Visit: Payer: Self-pay | Admitting: Cardiovascular Disease

## 2017-06-16 ENCOUNTER — Telehealth: Payer: Self-pay | Admitting: Family Medicine

## 2017-06-16 NOTE — Telephone Encounter (Signed)
Copied from [place]. Topic: Quick Communication - See Telephone Encounter >> Jun 16, 2017  9:43 AM Oneta Rack wrote: CRM for notification. See Telephone encounter for:   06/16/17.  Relation to pt: self  Call back number: [phone]    Reason for call:  Patient inquiring if shingle vaccination is in stock, if so patient would like to schedule nurse visit only, please advise

## 2017-06-16 NOTE — Telephone Encounter (Signed)
Spoke to pt and advised shingles vaccine is not yet in stock at our office.

## 2017-06-20 NOTE — Progress Notes (Signed)
[facility]  PATIENT: ANQUAN AZZARELLO DOB: 1951-04-13   REASON FOR VISIT: Follow-up for memory loss , insomnia HISTORY FROM: Patient and wife  HISTORY OF PRESENT ILLNESS:YYMr. Leinen is a very friendly 66 year old right-handed gentleman presents for followup consultation of his memory loss in the context of anoxic brain injury.   He is accompanied by his wife today. He was a patient of Dr. 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  EEG showed diffuse slowing in January 1999, CT head and brain MRI were normal. CT head repeat in November 2000 showed slight prominence of the lateral and third ventricles. CT head in July 2003 showed prominence of the ventricular system. Psychological testing and August 1999 showed borderline performance IQ. He became depressed. In January 2014 his MMSE was 29, clock drawing was 4, animal fluency was 10. He went to [facility] for 2 years for patient with degenerative disorder.  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 He lives at home with his wife, still driving. He helps the household, he still has difficulty focusing.   He has had some fluctuation in his memory per wife. He has ADD and continues to take Adderall, 10 mg twice a day, without Adderall, he could not remember the pages that he has read,he is also taking Aricept 10 mg every day, which has been very helpful too.  Up date April 14 2016YY  His overall doing very well, he likes play computer games, which has helped his eye and hands coordination, he had recent trip to  [place], felt overwhelming, he continued to drive short distance,  He is taking Ritalin 10 mg twice a day, Aricept 10 mg once a day, complains of insomnia, he took his last dose of Ritalin at evening time.  UPDATE Apr 21 2015:YY He is now taking Namenda 10mg  bid and aricept 10mg  qday, he still has trouble sleeping,  He watch movie, stay up late, he continues to have focus difficulty, tends to become agitated.  UPDATE April 27th 2017: YY He still has chronic insomnia, tends to have irregular sleep pattern, stay up late watching TV, he still very active during the day, take Adderall 10 mg twice a day, still drives short distance, mild unsteady gait, tends to hold his left arm to his chest  UPDATE Oct 17th 2017:YY He is with his wife,  Her daughter was hospital, presented with sudden loss of consciousness yesterday April 19 2016, which has brought back a lot of his memory, he has not tried clonazepam for chronic insomnia, worry about side effect, tried melatonin, still has significant insomnia, he is taking Tylenol pm,  UPDATE 06/18/2018CM Mr. Ratchford, 66 year old male returns for follow-up. He has a history of memory loss in the context of anoxic brain injury. This occurred in Jan 1999 due to arrythmia, 15 minutes without heart beat, status post defibrillator placement. His MMSE today is 29/30., He is currently on Aricept 10 mg daily and Namenda 10 mg twice a day in addition he is on Adderall 10 mg 2 times daily. He takes melatonin for insomnia,  he gets no regular exercise .  He returns for reevaluation UPDATE 12/18/2018CM Mr. Saber 76-year-old male returns for follow-up with a history of memory loss in the context of anoxic brain injury.  His MMSE today is stable.  He continues on Aricept 10 mg daily and amended 10 mg twice a day.  He takes melatonin for insomnia With good benefit.  He is on Adderall from his primary care which helps him to focus.  He returns for reevaluation.  REVIEW  OF SYSTEMS: Full 14 system review of systems performed and notable only for those listed, all others are neg:  Constitutional: neg  Cardiovascular: neg Ear/Nose/Throat: neg  Skin: neg Eyes: neg Respiratory: neg Gastroitestinal: neg  Hematology/Lymphatic: neg  Endocrine: neg Musculoskeletal: Joint pain Allergy/Immunology: neg Neurological: Memory loss Psychiatric: neg Sleep : neg   ALLERGIES: Allergies  Allergen Reactions  . Penicillins     Difficult Breathing, rash    HOME MEDICATIONS: Outpatient Medications Prior to Visit  Medication Sig Dispense Refill  . amphetamine-dextroamphetamine (ADDERALL) 10 MG tablet Take 1 tablet (10 mg total) by mouth 2 (two) times daily. Fill on/after 07/04/17 60 tablet 0  . Ascorbic Acid (VITAMIN C) 500 MG tablet Take 500 mg by mouth daily.      Marland Kitchen aspirin 81 MG tablet Take 81 mg by mouth daily.    Marland Kitchen diltiazem (CARTIA XT) 120 MG 24 hr capsule Take 1 capsule (120 mg total) by mouth daily. 5 capsule 0  . donepezil (ARICEPT) 10 MG tablet Take 1 tablet (10 mg total) by mouth daily. 90 tablet 3  . fexofenadine (ALLEGRA) 180 MG tablet Take 180 mg by mouth daily.      . Ginkgo Biloba 40 MG TABS Take by mouth 2 (two) times daily.      Marland Kitchen levothyroxine (SYNTHROID) 75 MCG tablet Take 1 tablet (75 mcg total) by mouth daily. 90 tablet 3  . Melatonin 5 MG CAPS Take by mouth at bedtime.    . memantine (NAMENDA) 10 MG tablet Take 1 tablet (10 mg total) 2 (two) times daily by mouth. Please call [phone] to schedule appt. 180 tablet 0  . metoprolol tartrate (LOPRESSOR) 25 MG tablet Take 1 tablet (25 mg total) by mouth 2 (two) times daily. 180 tablet 1  . montelukast (SINGULAIR) 10 MG tablet TAKE 1 TABLET DAILY 30 tablet 0  . Multiple Vitamin (MULTIVITAMIN) tablet Take 1 tablet by mouth daily.      . NON FORMULARY Focus factor 1 tab po bid     . rosuvastatin (CRESTOR) 20 MG tablet Take 1 tablet (20 mg total) by mouth at bedtime. 90 tablet 3  . vitamin E 400 UNIT  capsule Take 400 Units by mouth daily.       No facility-administered medications prior to visit.     PAST MEDICAL HISTORY: Past Medical History:  Diagnosis Date  . ADD (attention deficit disorder)   . Allergy    seasonal flares of perinneal allergies  . Cancer ([place])    melanoma  . Cardiac arrest ([place]) 07/1997   aborted  . Dementia    post resusitative  . Depression   . Diverticulosis   . Family hx of prostate cancer   . GERD (gastroesophageal reflux disease)   . Headache(784.0)   . Hyperlipidemia   . Hypothyroidism   . ICD (implantable cardiac defibrillator) in place   . Idiopathic urticaria   . Internal hemorrhoids   . Low back pain syndrome   . Melanoma in situ of skin of trunk ([place])  chest  . PVC (premature ventricular contraction)    associated with cardiac arrest  . Sleep apnea    no c-pap  . Tubular adenoma of colon 10/2010    PAST SURGICAL HISTORY: Past Surgical History:  Procedure Laterality Date  .  implantation x2    . CARDIAC DEFIBRILLATOR PLACEMENT  07/01/2010   Explanation of a previously implanted device, pocket revision, and insertion of a new device, and intraoperative defibrillation threshold testing Caryl Comes)  . CARDIAC DEFIBRILLATOR REMOVAL  2004   Defibrillator change Caryl Comes) 3 times changed  . HEMORRHOID SURGERY    . ICD    . TONSILLECTOMY      FAMILY HISTORY: Family History  Problem Relation Age of Onset  . Hypertension Mother   . Heart disease Father   . Diverticulosis Father   . Coronary artery disease Father   . Alopecia Father   . Prostate cancer Brother   . Lung cancer Paternal Grandfather        Smoker  . Cancer Paternal Grandfather        lung cancer - smoker  . Diabetes Brother   . Heart disease Brother        heart transplant  . Colon cancer Neg Hx     SOCIAL HISTORY: Social History   Socioeconomic History  . Marital status: Married    Spouse name: [name]  . Number of children: 2  . Years of education: College    . Highest education level: Not on file  Social Needs  . Financial resource strain: Not on file  . Food insecurity - worry: Not on file  . Food insecurity - inability: Not on file  . Transportation needs - medical: Not on file  . Transportation needs - non-medical: Not on file  Occupational History    Employer: DISABLED  Tobacco Use  . Smoking status: Former Smoker    Packs/day: 1.00    Years: 10.00    Pack years: 10.00    Types: Cigarettes    Last attempt to quit: 07/06/1995    Years since quitting: 21.9  . Smokeless tobacco: Former Systems developer    Types: [place] date: 11/04/1994  Substance and Sexual Activity  . Alcohol use: No    Alcohol/week: 0.0 oz    Comment: socially  . Drug use: No  . Sexual activity: Not on file  Other Topics Concern  . Not on file  Social History Narrative   Patient is married [name]) [phone] with 2 daughters   5 grandchildren   Daily caffeine use - one cup of coffee and one-two cokes per day   Patient is right-handed.   Patient has a college education     PHYSICAL EXAM  Vitals:   06/21/17 0949  BP: 125/83  Pulse: 67  Weight: 187 lb 9.6 oz (85.1 kg)  Height: 5\' 9"  (1.753 m)   Body mass index is 27.7 kg/m.  Generalized: Well developed, in no acute distress  Head: normocephalic and atraumatic,. Oropharynx benign  Neck: Supple, no carotid bruits  Cardiac: Regular rate rhythm, no murmur  Musculoskeletal: No deformity   Neurological examination   Mentation: Alert  MMSE - Mini Mental State Exam 06/21/2017 12/20/2016 04/20/2016  Orientation to time 5 5 3   Orientation to Place 5 5 5   Registration 3 3 3   Attention/ Calculation 5 5 5   Recall 1 2 1   Language- name 2 objects 2 2 2   Language- repeat 0 1 1  Language- follow 3 step  command 3 3 3   Language- read & follow direction 1 1 1   Write a sentence 1 1 1   Copy design 1 1 1   Total score 27 29 26    Follows all commands speech and language fluent.   Cranial nerve II-XII: Pupils were equal  round reactive to light extraocular movements were full, visual field were full on confrontational test. Facial sensation and strength were normal. hearing was intact to finger rubbing bilaterally. Uvula tongue midline. head turning and shoulder shrug were normal and symmetric.Tongue protrusion into cheek strength was normal. Motor: normal bulk and tone, full strength in the BUE, BLE, fine finger movements normal, no pronator drift. No focal weakness Sensory: normal and symmetric to light touch, pinprick, and  Vibration, in the upper and lower extremities Coordination: finger-nose-finger, heel-to-shin bilaterally, no dysmetria Reflexes: Symmetric upper and lower plantar responses were flexor bilaterally. Gait and Station: Rising up from seated position without assistance, normal stance,  moderate stride, good arm swing, smooth turning, able to perform tiptoe, and heel walking without difficulty. Tandem gait is steady. No assistive device  DIAGNOSTIC DATA (LABS, IMAGING, TESTING) - I reviewed patient records, labs, notes, testing and imaging myself where available.    Lab Results  Component Value Date   TSH 6.24 (H) 05/26/2016      ASSESSMENT AND PLAN  66 y.o. year old male  has a past medical history of ADD (attention deficit disorder); Anoxic brain injury with residual short-term memory troubles. Memory loss following cardiac arrest.   PLAN: Continue Aricept at 10 mg every day will refill Continue Namenda 10 mg twice a day will refill Continue Adderall 10 mg ordered by her primary care Over-the-counter melatonin for insomnia Follow-up in 6 months next with Dr. Krista Blue Exercise by walking this will also be beneficial for her memory and other chronic health conditions Dennie Bible, [facility], [facility], [facility] [address] [place], [place] [address] [phone]

## 2017-06-21 ENCOUNTER — Encounter: Payer: Self-pay | Admitting: Nurse Practitioner

## 2017-06-21 ENCOUNTER — Ambulatory Visit (INDEPENDENT_AMBULATORY_CARE_PROVIDER_SITE_OTHER): Payer: Medicare Other | Admitting: Nurse Practitioner

## 2017-06-21 VITALS — BP 125/83 | HR 67 | Ht 69.0 in | Wt 187.6 lb

## 2017-06-21 DIAGNOSIS — G4700 Insomnia, unspecified: Secondary | ICD-10-CM | POA: Diagnosis not present

## 2017-06-21 DIAGNOSIS — G931 Anoxic brain damage, not elsewhere classified: Secondary | ICD-10-CM

## 2017-06-21 DIAGNOSIS — R413 Other amnesia: Secondary | ICD-10-CM | POA: Diagnosis not present

## 2017-06-21 LAB — CUP PACEART INCLINIC DEVICE CHECK
Date Time Interrogation Session: 20181127050000
HighPow Impedance: 43 Ohm
HighPow Impedance: 65 Ohm
Implantable Lead Implant Date: 19990204
Implantable Lead Location: 753860
Implantable Lead Model: 135
Implantable Lead Serial Number: 301213
Implantable Pulse Generator Implant Date: 20111228
Lead Channel Impedance Value: 761 Ohm
Lead Channel Pacing Threshold Amplitude: 1.2 V
Lead Channel Pacing Threshold Pulse Width: 1 ms
Lead Channel Sensing Intrinsic Amplitude: 10.5 mV
Lead Channel Setting Pacing Amplitude: 2.4 V
Lead Channel Setting Pacing Pulse Width: 1 ms
Lead Channel Setting Sensing Sensitivity: 0.4 mV
Pulse Gen Serial Number: 277857

## 2017-06-21 MED ORDER — DONEPEZIL HCL 10 MG PO TABS: 10.0000 mg | ORAL_TABLET | Freq: Every day | ORAL | 3 refills | Status: DC

## 2017-06-21 MED ORDER — MEMANTINE HCL 10 MG PO TABS: 10.0000 mg | ORAL_TABLET | Freq: Two times a day (BID) | ORAL | 2 refills | Status: DC

## 2017-06-21 NOTE — Patient Instructions (Signed)
Continue Aricept at 10 mg every day will refill Continue Namenda 10 mg twice a day will refill Continue Adderall 10 mg ordered by her primary care Over-the-counter melatonin for insomnia Follow-up in 6 months next with Dr. Krista Blue Exercise by walking this will also be beneficial for her memory and other chronic health conditions

## 2017-06-21 NOTE — Progress Notes (Signed)
I have reviewed and agreed above plan. 

## 2017-06-30 DIAGNOSIS — H524 Presbyopia: Secondary | ICD-10-CM | POA: Diagnosis not present

## 2017-06-30 DIAGNOSIS — H2513 Age-related nuclear cataract, bilateral: Secondary | ICD-10-CM | POA: Diagnosis not present

## 2017-06-30 DIAGNOSIS — H01002 Unspecified blepharitis right lower eyelid: Secondary | ICD-10-CM | POA: Diagnosis not present

## 2017-06-30 DIAGNOSIS — H01005 Unspecified blepharitis left lower eyelid: Secondary | ICD-10-CM | POA: Diagnosis not present

## 2017-06-30 DIAGNOSIS — H5203 Hypermetropia, bilateral: Secondary | ICD-10-CM | POA: Diagnosis not present

## 2017-07-11 ENCOUNTER — Other Ambulatory Visit: Payer: Self-pay | Admitting: Family Medicine

## 2017-07-11 DIAGNOSIS — I10 Essential (primary) hypertension: Secondary | ICD-10-CM

## 2017-07-11 DIAGNOSIS — E7849 Other hyperlipidemia: Secondary | ICD-10-CM

## 2017-07-21 ENCOUNTER — Telehealth: Payer: Self-pay | Admitting: Family Medicine

## 2017-07-21 NOTE — Telephone Encounter (Signed)
Copied from [place] [id number]. Topic: Quick Communication - See Telephone Encounter >> Jul 21, 2017  9:19 AM Ahmed Prima L wrote: CRM for notification. See Telephone encounter for:   07/21/17.  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  [facility]:: [address] [place] [address]  He wants to know if this is okay with Dr Damita Dunnings.

## 2017-07-21 NOTE — Telephone Encounter (Signed)
adderall 10 mg last printed # 60 x 2 on 05/05/17; last annual 06/01/16 and no future appt scheduled.Please advise.

## 2017-07-22 MED ORDER — AMPHETAMINE-DEXTROAMPHETAMINE 10 MG PO TABS: 10.0000 mg | ORAL_TABLET | Freq: Two times a day (BID) | ORAL | 0 refills | Status: DC

## 2017-07-22 NOTE — Telephone Encounter (Signed)
Sent.  Schedule yearly [facility] visit.  Thanks.

## 2017-07-22 NOTE — Telephone Encounter (Signed)
Patient advised.

## 2017-07-28 ENCOUNTER — Ambulatory Visit: Payer: Medicare Other

## 2017-08-01 ENCOUNTER — Other Ambulatory Visit: Payer: Self-pay | Admitting: Family Medicine

## 2017-08-01 DIAGNOSIS — Z125 Encounter for screening for malignant neoplasm of prostate: Secondary | ICD-10-CM

## 2017-08-01 DIAGNOSIS — E785 Hyperlipidemia, unspecified: Secondary | ICD-10-CM

## 2017-08-01 DIAGNOSIS — E039 Hypothyroidism, unspecified: Secondary | ICD-10-CM

## 2017-08-03 ENCOUNTER — Ambulatory Visit: Payer: Medicare Other

## 2017-08-03 ENCOUNTER — Ambulatory Visit (INDEPENDENT_AMBULATORY_CARE_PROVIDER_SITE_OTHER): Payer: Medicare Other

## 2017-08-03 ENCOUNTER — Other Ambulatory Visit (INDEPENDENT_AMBULATORY_CARE_PROVIDER_SITE_OTHER): Payer: PPO

## 2017-08-03 VITALS — BP 142/90 | HR 83 | Temp 97.7°F | Ht 67.75 in | Wt 186.8 lb

## 2017-08-03 DIAGNOSIS — Z23 Encounter for immunization: Secondary | ICD-10-CM | POA: Diagnosis not present

## 2017-08-03 DIAGNOSIS — E785 Hyperlipidemia, unspecified: Secondary | ICD-10-CM | POA: Diagnosis not present

## 2017-08-03 DIAGNOSIS — E039 Hypothyroidism, unspecified: Secondary | ICD-10-CM | POA: Diagnosis not present

## 2017-08-03 DIAGNOSIS — Z0000 Encounter for general adult medical examination without abnormal findings: Secondary | ICD-10-CM

## 2017-08-03 DIAGNOSIS — Z125 Encounter for screening for malignant neoplasm of prostate: Secondary | ICD-10-CM

## 2017-08-03 LAB — LIPID PANEL
Cholesterol: 183 mg/dL (ref 0–200)
HDL: 51.6 mg/dL (ref >39.00)
LDL Cholesterol: 106 mg/dL — ABNORMAL HIGH (ref 0–99)
NonHDL: 131.69
Total CHOL/HDL Ratio: 4
Triglycerides: 129 mg/dL (ref 0.0–149.0)
VLDL: 25.8 mg/dL (ref 0.0–40.0)

## 2017-08-03 LAB — CBC WITH DIFFERENTIAL/PLATELET
Basophils Absolute: 0.1 K/uL (ref 0.0–0.1)
Basophils Relative: 0.8 % (ref 0.0–3.0)
Eosinophils Absolute: 0.2 K/uL (ref 0.0–0.7)
Eosinophils Relative: 2.7 % (ref 0.0–5.0)
HCT: 44.5 % (ref 39.0–52.0)
Hemoglobin: 15 g/dL (ref 13.0–17.0)
Lymphocytes Relative: 40.4 % (ref 12.0–46.0)
Lymphs Abs: 3 K/uL (ref 0.7–4.0)
MCHC: 33.6 g/dL (ref 30.0–36.0)
MCV: 92.6 fl (ref 78.0–100.0)
Monocytes Absolute: 0.7 K/uL (ref 0.1–1.0)
Monocytes Relative: 9.8 % (ref 3.0–12.0)
Neutro Abs: 3.4 K/uL (ref 1.4–7.7)
Neutrophils Relative %: 46.3 % (ref 43.0–77.0)
Platelets: 324 K/uL (ref 150.0–400.0)
RBC: 4.81 Mil/uL (ref 4.22–5.81)
RDW: 13.1 % (ref 11.5–15.5)
WBC: 7.4 K/uL (ref 4.0–10.5)

## 2017-08-03 LAB — COMPREHENSIVE METABOLIC PANEL WITH GFR
ALT: 30 U/L (ref 0–53)
AST: 23 U/L (ref 0–37)
Albumin: 4.3 g/dL (ref 3.5–5.2)
Alkaline Phosphatase: 57 U/L (ref 39–117)
BUN: 11 mg/dL (ref 6–23)
CO2: 31 meq/L (ref 19–32)
Calcium: 9.3 mg/dL (ref 8.4–10.5)
Chloride: 104 meq/L (ref 96–112)
Creatinine, Ser: 1.02 mg/dL (ref 0.40–1.50)
GFR: 77.89 mL/min
Glucose, Bld: 106 mg/dL — ABNORMAL HIGH (ref 70–99)
Potassium: 4.4 meq/L (ref 3.5–5.1)
Sodium: 143 meq/L (ref 135–145)
Total Bilirubin: 0.7 mg/dL (ref 0.2–1.2)
Total Protein: 6.7 g/dL (ref 6.0–8.3)

## 2017-08-03 LAB — TSH: TSH: 6.3 u[IU]/mL — ABNORMAL HIGH (ref 0.35–4.50)

## 2017-08-03 LAB — PSA, MEDICARE: PSA: 0.21 ng/ml (ref 0.10–4.00)

## 2017-08-03 NOTE — Patient Instructions (Addendum)
Larry [name] , Thank you for taking time to come for your Medicare Wellness Visit. I appreciate your ongoing commitment to your health goals. Please review the following plan we discussed and let me know if I can assist you in the future.   These are the goals we discussed: Goals    . Increase physical activity     Starting 08/03/2017, I will continue to walk at least 30 minutes daily and to use [employer] as tolerated.        This is a list of the screening recommended for you and due dates:  Health Maintenance  Topic Date Due  . Pneumonia vaccines (2 of 2 - PPSV23) 08/03/2018  . Tetanus Vaccine  11/21/2018  . Colon Cancer Screening  11/23/2020  . Flu Shot  Completed  .  Hepatitis C: One time screening is recommended by Center for Disease Control  (CDC) for  adults born from [birth year] through 1963.   Completed  . HIV Screening  Completed       Preventive Care for Adults  A healthy lifestyle and preventive care can promote health and wellness. Preventive health guidelines for adults include the following key practices.  . A routine yearly physical is a good way to check with your health care provider about your health and preventive screening. It is a chance to share any concerns and updates on your health and to receive a thorough exam.  . Visit your dentist for a routine exam and preventive care every 6 months. Brush your teeth twice a day and floss once a day. Good oral hygiene prevents tooth decay and gum disease.  . The frequency of eye exams is based on your age, health, family medical history, use  of contact lenses, and other factors. Follow your health care provider's recommendations for frequency of eye exams.  . Eat a healthy diet. Foods like vegetables, fruits, whole grains, low-fat dairy products, and lean protein foods contain the nutrients you need without too many calories. Decrease your intake of foods high in solid fats, added sugars, and salt. Eat the right amount of  calories for you. Get information about a proper diet from your health care provider, if necessary.  . Regular physical exercise is one of the most important things you can do for your health. Most adults should get at least 150 minutes of moderate-intensity exercise (any activity that increases your heart rate and causes you to sweat) each week. In addition, most adults need muscle-strengthening exercises on 2 or more days a week.  Silver Sneakers may be a benefit available to you. To determine eligibility, you may visit the website: www.silversneakers.com or contact program at [phone] Mon-Fri between 8AM-8PM.   . Maintain a healthy weight. The body mass index (BMI) is a screening tool to identify possible weight problems. It provides an estimate of body fat based on height and weight. Your health care provider can find your BMI and can help you achieve or maintain a healthy weight.   For adults 20 years and older: ? A BMI below 18.5 is considered underweight. ? A BMI of 18.5 to 24.9 is normal. ? A BMI of 25 to 29.9 is considered overweight. ? A BMI of 30 and above is considered obese.   . Maintain normal blood lipids and cholesterol levels by exercising and minimizing your intake of saturated fat. Eat a balanced diet with plenty of fruit and vegetables. Blood tests for lipids and cholesterol should begin at age 43 and  be repeated every 5 years. If your lipid or cholesterol levels are high, you are over 50, or you are at high risk for heart disease, you may need your cholesterol levels checked more frequently. Ongoing high lipid and cholesterol levels should be treated with medicines if diet and exercise are not working.  . If you smoke, find out from your health care provider how to quit. If you do not use tobacco, please do not start.  . If you choose to drink alcohol, please do not consume more than 2 drinks per day. One drink is considered to be 12 ounces (355 mL) of beer, 5 ounces  (148 mL) of wine, or 1.5 ounces (44 mL) of liquor.  . If you are 66-54 years old, ask your health care provider if you should take aspirin to prevent strokes.  . Use sunscreen. Apply sunscreen liberally and repeatedly throughout the day. You should seek shade when your shadow is shorter than you. Protect yourself by wearing long sleeves, pants, a wide-brimmed hat, and sunglasses year round, whenever you are outdoors.  . Once a month, do a whole body skin exam, using a mirror to look at the skin on your back. Tell your health care provider of new moles, moles that have irregular borders, moles that are larger than a pencil eraser, or moles that have changed in shape or color.

## 2017-08-03 NOTE — Progress Notes (Signed)
Subjective:   Larry [name] is a 67 y.o. male who presents for Medicare Annual/Subsequent preventive examination.  Review of Systems:  N/A Cardiac Risk Factors include: advanced age (>35men, >68 women);male gender;dyslipidemia;hypertension     Objective:    Vitals: BP (!) 142/90 (BP Location: Right Arm, Patient Position: Sitting, Cuff Size: Normal)   Pulse 83   Temp 97.7 F (36.5 C) (Oral)   Ht 5' 7.75" (1.721 m) Comment: no shoes  Wt 186 lb 12 oz (84.7 kg)   SpO2 95%   BMI 28.61 kg/m   Body mass index is 28.61 kg/m.  Advanced Directives 08/03/2017  Does Patient Have a Medical Advance Directive? No  Would patient like information on creating a medical advance directive? Yes (MAU/Ambulatory/Procedural Areas - Information given)    Tobacco Social History   Tobacco Use  Smoking Status Former Smoker  . Packs/day: 1.00  . Years: 10.00  . Pack years: 10.00  . Types: Cigarettes  . Last attempt to quit: 07/06/1995  . Years since quitting: 22.0  Smokeless Tobacco Former Systems developer  . Types: Chew  . Quit date: 11/04/1994     Counseling given: No   Clinical Intake:  Pre-visit preparation completed: Yes  Pain : No/denies pain Pain Score: 0-No pain     Nutritional Status: BMI 25 -29 Overweight Nutritional Risks: None Diabetes: No  How often do you need to have someone help you when you read instructions, pamphlets, or other written materials from your doctor or pharmacy?: 2 - Rarely What is the last grade level you completed in school?: Associate degree  Interpreter Needed?: No  Comments: pt lives with spouse Information entered by :: LPinson, LPN  Past Medical History:  Diagnosis Date  . ADD (attention deficit disorder)   . Allergy    seasonal flares of perinneal allergies  . Cancer ([place])    melanoma  . Cardiac arrest ([place]) 07/1997   aborted  . Dementia    post resusitative  . Depression   . Diverticulosis   . Family hx of prostate cancer   . GERD  (gastroesophageal reflux disease)   . Headache(784.0)   . Hyperlipidemia   . Hypothyroidism   . ICD (implantable cardiac defibrillator) in place   . Idiopathic urticaria   . Internal hemorrhoids   . Low back pain syndrome   . Melanoma in situ of skin of trunk (HCC)    chest  . PVC (premature ventricular contraction)    associated with cardiac arrest  . Sleep apnea    no c-pap  . Tubular adenoma of colon 10/2010   Past Surgical History:  Procedure Laterality Date  .  implantation x2    . CARDIAC DEFIBRILLATOR PLACEMENT  07/01/2010   Explanation of a previously implanted device, pocket revision, and insertion of a new device, and intraoperative defibrillation threshold testing Caryl Comes)  . CARDIAC DEFIBRILLATOR REMOVAL  2004   Defibrillator change Caryl Comes) 3 times changed  . HEMORRHOID SURGERY    . ICD    . TONSILLECTOMY     Family History  Problem Relation Age of Onset  . Hypertension Mother   . Heart disease Father   . Diverticulosis Father   . Coronary artery disease Father   . Alopecia Father   . Prostate cancer Brother   . Lung cancer Paternal Grandfather        Smoker  . Cancer Paternal Grandfather        lung cancer - smoker  . Diabetes Brother   .  Heart disease Brother        heart transplant  . Colon cancer Neg Hx    Social History   Socioeconomic History  . Marital status: Married    Spouse name: [name]  . Number of children: 2  . Years of education: College  . Highest education level: None  Social Needs  . Financial resource strain: None  . Food insecurity - worry: None  . Food insecurity - inability: None  . Transportation needs - medical: None  . Transportation needs - non-medical: None  Occupational History    Employer: DISABLED  Tobacco Use  . Smoking status: Former Smoker    Packs/day: 1.00    Years: 10.00    Pack years: 10.00    Types: Cigarettes    Last attempt to quit: 07/06/1995    Years since quitting: 22.0  . Smokeless tobacco: Former  Systems developer    Types: [place] date: 11/04/1994  Substance and Sexual Activity  . Alcohol use: No    Alcohol/week: 0.0 oz    Comment: socially  . Drug use: No  . Sexual activity: None  Other Topics Concern  . None  Social History Narrative   Patient is married [name]) [phone] with 2 daughters   5 grandchildren   Daily caffeine use - one cup of coffee and one-two cokes per day   Patient is right-handed.   Patient has a college education    Outpatient Encounter Medications as of 08/03/2017  Medication Sig  . amphetamine-dextroamphetamine (ADDERALL) 10 MG tablet Take 1 tablet (10 mg total) by mouth 2 (two) times daily. Fill on/after 07/04/17  . amphetamine-dextroamphetamine (ADDERALL) 10 MG tablet Take 1 tablet (10 mg total) by mouth 2 (two) times daily.  . Ascorbic Acid (VITAMIN C) 500 MG tablet Take 500 mg by mouth daily.    Marland Kitchen aspirin 81 MG tablet Take 81 mg by mouth daily.  Marland Kitchen diltiazem (CARTIA XT) 120 MG 24 hr capsule Take 1 capsule (120 mg total) by mouth daily.  Marland Kitchen donepezil (ARICEPT) 10 MG tablet Take 1 tablet (10 mg total) by mouth daily.  . fexofenadine (ALLEGRA) 180 MG tablet Take 180 mg by mouth daily.    . Ginkgo Biloba 40 MG TABS Take by mouth 2 (two) times daily.    Marland Kitchen levothyroxine (SYNTHROID) 75 MCG tablet Take 1 tablet (75 mcg total) by mouth daily.  . Melatonin 5 MG CAPS Take by mouth at bedtime.  . memantine (NAMENDA) 10 MG tablet Take 1 tablet (10 mg total) by mouth 2 (two) times daily. Please call [phone] to schedule appt.  . metoprolol tartrate (LOPRESSOR) 25 MG tablet Take 1 tablet (25 mg total) by mouth 2 (two) times daily.  . montelukast (SINGULAIR) 10 MG tablet TAKE 1 TABLET DAILY  . Multiple Vitamin (MULTIVITAMIN) tablet Take 1 tablet by mouth daily.    . NON FORMULARY Focus factor 1 tab po bid   . rosuvastatin (CRESTOR) 20 MG tablet TAKE 1 TABLET AT BEDTIME  . vitamin E 400 UNIT capsule Take 400 Units by mouth daily.     No facility-administered encounter  medications on file as of 08/03/2017.     Activities of Daily Living In your present state of health, do you have any difficulty performing the following activities: 08/03/2017  Hearing? N  Vision? N  Difficulty concentrating or making decisions? Y  Walking or climbing stairs? N  Dressing or bathing? N  Doing errands, shopping? N  Preparing Food and eating ?  N  Using the Toilet? N  In the past six months, have you accidently leaked urine? N  Do you have problems with loss of bowel control? N  Managing your Medications? N  Managing your Finances? N  Housekeeping or managing your Housekeeping? N  Some recent data might be hidden    Patient Care Team: [name], MD as PCP - General (Family Medicine)   Assessment:   This is a routine wellness examination for [place].   Hearing Screening   125Hz  250Hz  500Hz  1000Hz  2000Hz  3000Hz  4000Hz  6000Hz  8000Hz   Right ear:   40 40 40  40    Left ear:   40 40 40  40    Vision Screening Comments: Last vision exam in 2018     Exercise Activities and Dietary recommendations Current Exercise Habits: Home exercise routine, Type of exercise: walking, Time (Minutes): 30, Frequency (Times/Week): 7, Weekly Exercise (Minutes/Week): 210, Intensity: Moderate, Exercise limited by: None identified  Goals    . Increase physical activity     Starting 08/03/2017, I will continue to walk at least 30 minutes daily and to use [employer] as tolerated.        Fall Risk Fall Risk  08/03/2017 05/27/2015 03/07/2014 02/15/2013  Falls in the past year? No No No No   Depression Screen PHQ 2/9 Scores 08/03/2017 05/27/2015 03/07/2014 02/15/2013  PHQ - 2 Score 0 0 0 2  PHQ- 9 Score 0 - - -    Cognitive Function MMSE - Mini Mental State Exam 08/03/2017 06/21/2017 12/20/2016 04/20/2016 10/20/2015  Orientation to time 5 5 5 3 4   Orientation to Place 5 5 5 5 5   Registration 3 3 3 3 3   Attention/ Calculation 0 5 5 5 5   Recall 0 1 2 1 2   Recall-comments unable to recall  3 of 3 words - - - -  Language- name 2 objects 0 2 2 2 2   Language- repeat 1 0 1 1 1   Language- follow 3 step command 3 3 3 3 3   Language- read & follow direction 0 1 1 1 1   Write a sentence 0 1 1 1 1   Copy design 0 1 1 1 1   Total score 17 27 29 26 28         Immunization History  Administered Date(s) Administered  . Influenza Whole 05/01/2013  . Influenza,inj,Quad PF,6+ Mos 05/27/2015, 06/01/2016, 04/19/2017  . Pneumococcal Conjugate-13 08/03/2017  . Td 01/02/1997, 11/20/2008    Screening Tests Health Maintenance  Topic Date Due  . PNA vac Low Risk Adult (2 of 2 - PPSV23) 08/03/2018  . TETANUS/TDAP  11/21/2018  . COLONOSCOPY  11/23/2020  . INFLUENZA VACCINE  Completed  . Hepatitis C Screening  Completed  . HIV Screening  Completed     Plan:     I have personally reviewed, addressed, and noted the following in the patient's chart:  A. Medical and social history B. Use of alcohol, tobacco or illicit drugs  C. Current medications and supplements D. Functional ability and status E.  Nutritional status F.  Physical activity G. Advance directives H. List of other physicians I.  Hospitalizations, surgeries, and ER visits in previous 12 months J.  [place] to include hearing, vision, cognitive, depression L. Referrals and appointments - none  In addition, I have reviewed and discussed with patient certain preventive protocols, quality metrics, and best practice recommendations. A written personalized care plan for preventive services as well as general preventive health  recommendations were provided to patient.  See attached scanned questionnaire for additional information.   Signed,   Lindell Noe, MHA, BS, LPN Health Coach

## 2017-08-03 NOTE — Progress Notes (Signed)
Pre visit review using our clinic review tool, if applicable. No additional management support is needed unless otherwise documented below in the visit note. 

## 2017-08-03 NOTE — Progress Notes (Signed)
PCP notes:   Health maintenance:  PCV13 - administered  Abnormal screenings:   Mini-Cog score: 17/20 MMSE - Mini Mental State Exam 08/03/2017 06/21/2017 12/20/2016 04/20/2016 10/20/2015  Orientation to time 5 5 5 3 4   Orientation to Place 5 5 5 5 5   Registration 3 3 3 3 3   Attention/ Calculation 0 5 5 5 5   Recall 0 1 2 1 2   Recall-comments unable to recall 3 of 3 words - - - -  Language- name 2 objects 0 2 2 2 2   Language- repeat 1 0 1 1 1   Language- follow 3 step command 3 3 3 3 3   Language- read & follow direction 0 1 1 1 1   Write a sentence 0 1 1 1 1   Copy design 0 1 1 1 1   Total score 17 27 29 26 28        Patient concerns:   None  Nurse concerns:  None  Next PCP appt:   08/12/17 @ 1215

## 2017-08-07 NOTE — Progress Notes (Signed)
I reviewed health advisor's note, was available for consultation, and agree with documentation and plan.  

## 2017-08-11 DIAGNOSIS — Z08 Encounter for follow-up examination after completed treatment for malignant neoplasm: Secondary | ICD-10-CM | POA: Diagnosis not present

## 2017-08-11 DIAGNOSIS — Z1283 Encounter for screening for malignant neoplasm of skin: Secondary | ICD-10-CM | POA: Diagnosis not present

## 2017-08-11 DIAGNOSIS — Z85820 Personal history of malignant melanoma of skin: Secondary | ICD-10-CM | POA: Diagnosis not present

## 2017-08-12 ENCOUNTER — Encounter: Payer: Self-pay | Admitting: Family Medicine

## 2017-08-12 ENCOUNTER — Ambulatory Visit (INDEPENDENT_AMBULATORY_CARE_PROVIDER_SITE_OTHER): Payer: PPO | Admitting: Family Medicine

## 2017-08-12 VITALS — BP 120/88 | HR 83 | Temp 97.7°F | Ht 67.75 in | Wt 186.8 lb

## 2017-08-12 DIAGNOSIS — F988 Other specified behavioral and emotional disorders with onset usually occurring in childhood and adolescence: Secondary | ICD-10-CM

## 2017-08-12 DIAGNOSIS — G931 Anoxic brain damage, not elsewhere classified: Secondary | ICD-10-CM

## 2017-08-12 DIAGNOSIS — E785 Hyperlipidemia, unspecified: Secondary | ICD-10-CM

## 2017-08-12 DIAGNOSIS — R413 Other amnesia: Secondary | ICD-10-CM | POA: Diagnosis not present

## 2017-08-12 DIAGNOSIS — I10 Essential (primary) hypertension: Secondary | ICD-10-CM | POA: Diagnosis not present

## 2017-08-12 DIAGNOSIS — E039 Hypothyroidism, unspecified: Secondary | ICD-10-CM

## 2017-08-12 DIAGNOSIS — Z0000 Encounter for general adult medical examination without abnormal findings: Secondary | ICD-10-CM

## 2017-08-12 MED ORDER — LEVOTHYROXINE SODIUM 75 MCG PO TABS: 75.0000 ug | ORAL_TABLET | Freq: Every day | ORAL | Status: DC

## 2017-08-12 NOTE — Patient Instructions (Addendum)
Check with your insurance to see if they will cover the shingrix shot. Take care.  Glad to see you.  Don't change your meds for now except for taking an extra 1/2 tab of thyroid medicine on Sundays.  You'll take 1 pill a day except for 1.5 tabs on Sundays.  Recheck TSH in about 2 months at a nonfasting lab visit.

## 2017-08-12 NOTE — Progress Notes (Signed)
PSA wnl, d/w pt.   PCV13 - administered prev Advance directive d/w pt. wife designated if patient were incapacitated  He is trying to help his father relocate.  His father will have to be out of the house by 09/16/17, since he sold the house.  Patient's grandson is serving in [place].  D/w pt.    Memory troubles discussed with patient.  Mini-Cog score: 17/20.  Still on donepezil and namenda and adderall at baseline.  His situation is stable overall with occ troubles processing new information.  He can get frustrated with the troubles with concentration, at baseline.  No ADE on meds.  No red flag events.  Still functional, safe.    Hypothyroidism.  TSH slightly up.  Compliant. No dysphagia.  Some fatigue.   Elevated Cholesterol: Using medications without problems:yes Muscle aches: no Diet compliance:yes Exercise: encouraged.   Labs d/w pt.    S/p defib placement.  No CP, SOB, BLE edema.  Compliant with meds.    He is still seeing dermatology with h/o melanoma.  He had derm f/u this week.    Meds, vitals, and allergies reviewed.   PMH and SH reviewed  ROS: Per HPI unless specifically indicated in ROS section   GEN: nad, alert and oriented HEENT: mucous membranes moist NECK: supple w/o LA CV: rrr. PULM: ctab, no inc wob ABD: soft, +bs EXT: no edema SKIN: no acute rash

## 2017-08-14 DIAGNOSIS — Z0000 Encounter for general adult medical examination without abnormal findings: Secondary | ICD-10-CM | POA: Insufficient documentation

## 2017-08-14 NOTE — Assessment & Plan Note (Signed)
Would continue with Adderall.  See discussion of memory trouble.

## 2017-08-14 NOTE — Assessment & Plan Note (Signed)
PSA wnl, d/w pt.   PCV13 - administered prev Advance directive d/w pt. wife designated if patient were incapacitated  He is trying to help his father relocate.  His father will have to be out of the house by 09/16/17, since he sold the house.  Patient's grandson is serving in [place].  D/w pt.

## 2017-08-14 NOTE — Assessment & Plan Note (Signed)
Improved from previous.  Labs discussed with patient.  Discussed with patient about diet and exercise.  Continue as is with current medications.  He agrees.

## 2017-08-14 NOTE — Assessment & Plan Note (Signed)
TSH slightly up.  Compliant. No dysphagia.  Some fatigue.  Would increase his levothyroxine to 1 tab a day except for 1.5 tabs on Sunday and recheck TSH in a few months.  He agrees.  No thyromegaly on exam.

## 2017-08-14 NOTE — Assessment & Plan Note (Signed)
Mini-Cog score: 17/20.  Still on donepezil and namenda and adderall at baseline.  His situation is stable overall with occ troubles processing new information.  He can get frustrated with the troubles with concentration, at baseline.  No ADE on meds.  No red flag events.  Still functional, safe.  Continue as is.  He agrees.

## 2017-08-14 NOTE — Assessment & Plan Note (Signed)
Reasonable control.  No adverse effect on medication.  No chest pain.  Continue as is.  He agrees.

## 2017-08-14 NOTE — Assessment & Plan Note (Signed)
With subsequent memory and concentration troubles.  Would continue as is.  See discussion of those issues separately. >25 minutes spent in face to face time with patient, >50% spent in counselling or coordination of care, discussing memory, medications, labs, diet, exercise.

## 2017-09-14 ENCOUNTER — Ambulatory Visit (INDEPENDENT_AMBULATORY_CARE_PROVIDER_SITE_OTHER): Payer: PPO | Admitting: *Deleted

## 2017-09-14 DIAGNOSIS — Z95810 Presence of automatic (implantable) cardiac defibrillator: Secondary | ICD-10-CM

## 2017-09-14 DIAGNOSIS — I469 Cardiac arrest, cause unspecified: Secondary | ICD-10-CM | POA: Diagnosis not present

## 2017-09-14 LAB — CUP PACEART INCLINIC DEVICE CHECK
Brady Statistic RV Percent Paced: 4 %
Date Time Interrogation Session: 20190313040000
HighPow Impedance: 43 Ohm
HighPow Impedance: 69 Ohm
Implantable Lead Implant Date: 19990204
Implantable Lead Location: 753860
Implantable Lead Model: 135
Implantable Lead Serial Number: 301213
Implantable Pulse Generator Implant Date: 20111228
Lead Channel Impedance Value: 736 Ohm
Lead Channel Pacing Threshold Amplitude: 1.2 V
Lead Channel Pacing Threshold Pulse Width: 1 ms
Lead Channel Sensing Intrinsic Amplitude: 9.9 mV
Lead Channel Setting Pacing Amplitude: 2.4 V
Lead Channel Setting Pacing Pulse Width: 1 ms
Lead Channel Setting Sensing Sensitivity: 0.4 mV
Pulse Gen Serial Number: 277857

## 2017-09-14 NOTE — Progress Notes (Signed)
ICD check in clinic. Normal device function. Thresholds and sensing consistent with previous device measurements. Impedance trends stable over time. 1 monitored NSVT episode, 8bts duration. Histogram distribution appropriate for patient and level of activity. No changes made this session. Device programmed at appropriate safety margins. Device programmed to optimize intrinsic conduction. Estimated longevity 6 years. Patient declines remote monitoring. Patient education completed including shock plan. ROV with Device Clinic on 12/26/17 and ROV with SK/B in 05/2018.

## 2017-09-15 ENCOUNTER — Telehealth: Payer: Self-pay | Admitting: Nurse Practitioner

## 2017-09-15 ENCOUNTER — Other Ambulatory Visit: Payer: Self-pay | Admitting: Family Medicine

## 2017-09-15 ENCOUNTER — Other Ambulatory Visit: Payer: Self-pay | Admitting: Cardiovascular Disease

## 2017-09-15 DIAGNOSIS — R413 Other amnesia: Secondary | ICD-10-CM

## 2017-09-15 MED ORDER — METOPROLOL TARTRATE 25 MG PO TABS: 25.0000 mg | ORAL_TABLET | Freq: Two times a day (BID) | ORAL | 2 refills | Status: DC

## 2017-09-15 MED ORDER — DILTIAZEM HCL ER COATED BEADS 120 MG PO CP24: 120.0000 mg | ORAL_CAPSULE | Freq: Every day | ORAL | 2 refills | Status: DC

## 2017-09-15 NOTE — Telephone Encounter (Signed)
LVM requesting call back to verify that he wants medication refills through [facility].

## 2017-09-15 NOTE — Telephone Encounter (Signed)
[place] [phone]  NABP #[id number] request refill for the patient for memantine (NAMENDA) 10 MG tablet and donepezil (ARICEPT) 10 MG tablet. This is a new pharmacy

## 2017-09-15 NOTE — Telephone Encounter (Signed)
I appreciate that she asked for the names of the medications instead of accepting "ALL".   Called and spoke to pt to clarify. He said he does not need refills of anything right now. He said they were just wanting a list of medications. He will contact us when he needs refills sent to [facility] for the first time.

## 2017-09-15 NOTE — Telephone Encounter (Signed)
°*  STAT* If patient is at the pharmacy, call can be transferred to refill team.   1. Which medications need to be refilled? (please list name of each medication and dose if known) Diltiazem 120 mg 24 hr // Metoprolol tartrate 25 mg   2. Which pharmacy/location (including street and city if local pharmacy) is medication to be sent to? [place] fax [phone]  3. Do they need a 30 day or 90 day supply? Pharmacy did not know the supply amount

## 2017-09-15 NOTE — Telephone Encounter (Signed)
Pt's medication was sent to pt's pharmacy as requested. Confirmation received.  °

## 2017-09-15 NOTE — Telephone Encounter (Signed)
Copied from [place] Garcia [phone]. Topic: Quick Communication - See Telephone Encounter >> Sep 15, 2017 11:08 AM Conception Chancy, NT wrote: CRM for notification. See Telephone encounter for:  09/15/17.  Larry [name] is calling from [place] and states the patient is needing all his medications refilled that Dr. Damita Dunnings prescribes. I asked her to list me the name of medications she stated she could not provide me with that information. Please advise.  Fax# [phone] NAVP# [id number]

## 2017-09-16 ENCOUNTER — Other Ambulatory Visit: Payer: Self-pay | Admitting: *Deleted

## 2017-09-16 DIAGNOSIS — E7849 Other hyperlipidemia: Secondary | ICD-10-CM

## 2017-09-16 DIAGNOSIS — I10 Essential (primary) hypertension: Secondary | ICD-10-CM

## 2017-09-16 MED ORDER — DONEPEZIL HCL 10 MG PO TABS: 10.0000 mg | ORAL_TABLET | Freq: Every day | ORAL | 1 refills | Status: DC

## 2017-09-16 MED ORDER — ROSUVASTATIN CALCIUM 20 MG PO TABS: 20.0000 mg | ORAL_TABLET | Freq: Every day | ORAL | 3 refills | Status: DC

## 2017-09-16 MED ORDER — MEMANTINE HCL 10 MG PO TABS: 10.0000 mg | ORAL_TABLET | Freq: Two times a day (BID) | ORAL | 1 refills | Status: DC

## 2017-09-16 MED ORDER — MONTELUKAST SODIUM 10 MG PO TABS: 10.0000 mg | ORAL_TABLET | Freq: Every day | ORAL | 3 refills | Status: DC

## 2017-09-16 MED ORDER — LEVOTHYROXINE SODIUM 75 MCG PO TABS: ORAL_TABLET | ORAL | 3 refills | Status: DC

## 2017-09-16 NOTE — Telephone Encounter (Signed)
Received fax request to refill aricept, namenda to [facility] pharmacy. This was e scribed for 6 months supply of both. Patient has follow up scheduled.

## 2017-10-07 ENCOUNTER — Other Ambulatory Visit: Payer: Self-pay | Admitting: Cardiovascular Disease

## 2017-10-07 MED ORDER — DILTIAZEM HCL ER COATED BEADS 120 MG PO CP24: 120.0000 mg | ORAL_CAPSULE | Freq: Every day | ORAL | 0 refills | Status: DC

## 2017-10-07 NOTE — Telephone Encounter (Signed)
Pt was sent a 15 day supply into local pharmacy until mail order arrives. Confirmation received.

## 2017-10-07 NOTE — Telephone Encounter (Signed)
°*  STAT* If patient is at the pharmacy, call can be transferred to refill team.   1. Which medications need to be refilled? (please list name of each medication and dose if known) Cardizem 120 mg   2. Which pharmacy/location (including street and city if local pharmacy) is [facility] sent [facility]   3. Do they need a 30 day or 90 day supply? 30( needs some to get him thru until his mail order comes in )

## 2017-10-12 ENCOUNTER — Other Ambulatory Visit (INDEPENDENT_AMBULATORY_CARE_PROVIDER_SITE_OTHER): Payer: PPO

## 2017-10-12 DIAGNOSIS — E039 Hypothyroidism, unspecified: Secondary | ICD-10-CM | POA: Diagnosis not present

## 2017-10-12 LAB — TSH: TSH: 5.09 u[IU]/mL — ABNORMAL HIGH (ref 0.35–4.50)

## 2017-10-14 ENCOUNTER — Other Ambulatory Visit: Payer: Self-pay | Admitting: *Deleted

## 2017-10-14 ENCOUNTER — Other Ambulatory Visit: Payer: Self-pay | Admitting: Family Medicine

## 2017-10-14 DIAGNOSIS — E039 Hypothyroidism, unspecified: Secondary | ICD-10-CM

## 2017-10-14 MED ORDER — MONTELUKAST SODIUM 10 MG PO TABS: 10.0000 mg | ORAL_TABLET | Freq: Every day | ORAL | 3 refills | Status: DC

## 2017-10-14 MED ORDER — LEVOTHYROXINE SODIUM 75 MCG PO TABS: ORAL_TABLET | ORAL | Status: DC

## 2017-11-18 ENCOUNTER — Other Ambulatory Visit: Payer: Self-pay | Admitting: Nurse Practitioner

## 2017-11-18 ENCOUNTER — Other Ambulatory Visit: Payer: Self-pay | Admitting: Family Medicine

## 2017-11-18 ENCOUNTER — Other Ambulatory Visit: Payer: Self-pay | Admitting: Neurology

## 2017-11-18 ENCOUNTER — Other Ambulatory Visit: Payer: Self-pay | Admitting: Cardiovascular Disease

## 2017-11-18 DIAGNOSIS — R413 Other amnesia: Secondary | ICD-10-CM

## 2017-11-18 DIAGNOSIS — I10 Essential (primary) hypertension: Secondary | ICD-10-CM

## 2017-11-18 DIAGNOSIS — E7849 Other hyperlipidemia: Secondary | ICD-10-CM

## 2017-11-18 MED ORDER — ROSUVASTATIN CALCIUM 20 MG PO TABS
20.0000 mg | ORAL_TABLET | Freq: Every day | ORAL | 3 refills | Status: DC
Start: 2017-11-18 — End: 2018-08-14

## 2017-11-18 MED ORDER — LEVOTHYROXINE SODIUM 75 MCG PO TABS: ORAL_TABLET | ORAL | Status: DC

## 2017-11-18 NOTE — Telephone Encounter (Signed)
Copied from [place] [id number]. Topic: Quick Communication - Rx Refill/Question >> Nov 18, 2017  9:18 AM Cleaster Corin, NT wrote: Medication: amphetamine-dextroamphetamine (ADDERALL) 10 MG tablet [[id number]]  Has the patient contacted their pharmacy?yes (Agent: If no, request that the patient contact the pharmacy for the refill.) Preferred Pharmacy (with phone number or street name)[facility] [place], [place] [address] [id number] [place] [place] [phone] (Phone) [phone] (Fax)  *Pt would like to know if he can get [facility] and [facility]   Phone: [phone] Fax: [phone] Agent: Please be advised that RX refills may take up to 3 business days. We ask that you follow-up with your pharmacy.

## 2017-11-18 NOTE — Telephone Encounter (Signed)
Rx refill request: Adderall 10 mg    Last filled: 07/22/17 # 180  LOV: 10/14/17  PCP: [place]                    [facility]

## 2017-11-20 MED ORDER — AMPHETAMINE-DEXTROAMPHETAMINE 10 MG PO TABS: 10.0000 mg | ORAL_TABLET | Freq: Two times a day (BID) | ORAL | 0 refills | Status: DC

## 2017-11-20 NOTE — Telephone Encounter (Signed)
Sent locally. When he is getting to the last ~week of medicine, then have him let us know and we can send it to mail order at that point.  Thanks.

## 2017-11-21 NOTE — Telephone Encounter (Signed)
Wife advised. 

## 2017-11-22 ENCOUNTER — Other Ambulatory Visit: Payer: Self-pay

## 2017-11-22 ENCOUNTER — Other Ambulatory Visit: Payer: Self-pay | Admitting: *Deleted

## 2017-11-22 MED ORDER — LEVOTHYROXINE SODIUM 75 MCG PO TABS: ORAL_TABLET | ORAL | 1 refills | Status: DC

## 2017-11-22 MED ORDER — DILTIAZEM HCL ER COATED BEADS 120 MG PO CP24: 120.0000 mg | ORAL_CAPSULE | Freq: Every day | ORAL | 0 refills | Status: DC

## 2017-12-14 ENCOUNTER — Other Ambulatory Visit: Payer: Self-pay | Admitting: Cardiovascular Disease

## 2017-12-14 ENCOUNTER — Other Ambulatory Visit (INDEPENDENT_AMBULATORY_CARE_PROVIDER_SITE_OTHER): Payer: Medicare Other

## 2017-12-14 ENCOUNTER — Other Ambulatory Visit: Payer: Self-pay | Admitting: Family Medicine

## 2017-12-14 DIAGNOSIS — E039 Hypothyroidism, unspecified: Secondary | ICD-10-CM

## 2017-12-14 LAB — TSH: TSH: 5.66 u[IU]/mL — ABNORMAL HIGH (ref 0.35–4.50)

## 2017-12-14 MED ORDER — METOPROLOL TARTRATE 25 MG PO TABS: 25.0000 mg | ORAL_TABLET | Freq: Two times a day (BID) | ORAL | 0 refills | Status: DC

## 2017-12-14 MED ORDER — LEVOTHYROXINE SODIUM 88 MCG PO TABS: ORAL_TABLET | ORAL | 3 refills | Status: DC

## 2017-12-14 NOTE — Telephone Encounter (Signed)
New message    *STAT* If patient is at the pharmacy, call can be transferred to refill team.   1. Which medications need to be refilled? (please list name of each medication and dose if known) metoprolol tartrate (LOPRESSOR) 25 MG tablet  2. Which pharmacy/location (including street and city if local pharmacy) is medication to be sent to [place], [place]  3. Do they need a 30 day or 90 day supply? [place]

## 2017-12-14 NOTE — Telephone Encounter (Signed)
Pt's medication was sent to pt's pharmacy as requested. Confirmation received.  °

## 2017-12-15 ENCOUNTER — Other Ambulatory Visit: Payer: Self-pay | Admitting: *Deleted

## 2017-12-15 ENCOUNTER — Other Ambulatory Visit: Payer: Self-pay | Admitting: Family Medicine

## 2017-12-15 MED ORDER — LEVOTHYROXINE SODIUM 75 MCG PO TABS
ORAL_TABLET | ORAL | Status: DC
Start: 2017-12-15 — End: 2018-03-08

## 2017-12-15 NOTE — Telephone Encounter (Signed)
Copied from [place] [id number]. Topic: Quick Communication - Rx Refill/Question >> Dec 15, 2017  2:27 PM Oliver Pila B wrote: Medication: amphetamine-dextroamphetamine (ADDERALL) 10 MG tablet [[id number]] 28 day supply   Has the patient contacted their pharmacy? Yes.   (Agent: If no, request that the patient contact the pharmacy for the refill.) (Agent: If yes, when and what did the pharmacy advise?)  [facility] phone number [facility]  Agent: Please be advised that RX refills may take up to 3 business days. We ask that you follow-up with your pharmacy.

## 2017-12-15 NOTE — Telephone Encounter (Signed)
Adderall refill Last Refill:11/20/17 #60 Last OV: 08/12/17 PCP: Dr. Damita Dunnings Pharmacy: [facility]

## 2017-12-16 NOTE — Telephone Encounter (Signed)
Name of Medication: Adderall 10g [facility] Last Fill or Written Date and Quantity: # 42 on 11/20/17 Last Office Visit and Type: 08/12/17 annual Next Office Visit and Type: 08/14/18 CPX

## 2017-12-17 ENCOUNTER — Encounter: Payer: Self-pay | Admitting: Family Medicine

## 2017-12-17 MED ORDER — AMPHETAMINE-DEXTROAMPHETAMINE 10 MG PO TABS: 10.0000 mg | ORAL_TABLET | Freq: Two times a day (BID) | ORAL | 0 refills | Status: DC

## 2017-12-17 NOTE — Telephone Encounter (Signed)
Sent. Thanks.   

## 2017-12-19 ENCOUNTER — Encounter: Payer: Self-pay | Admitting: Family Medicine

## 2017-12-19 DIAGNOSIS — E785 Hyperlipidemia, unspecified: Secondary | ICD-10-CM | POA: Diagnosis not present

## 2017-12-19 DIAGNOSIS — R410 Disorientation, unspecified: Secondary | ICD-10-CM | POA: Diagnosis not present

## 2017-12-19 DIAGNOSIS — R55 Syncope and collapse: Secondary | ICD-10-CM | POA: Diagnosis not present

## 2017-12-19 DIAGNOSIS — E039 Hypothyroidism, unspecified: Secondary | ICD-10-CM | POA: Diagnosis not present

## 2017-12-19 DIAGNOSIS — Z95810 Presence of automatic (implantable) cardiac defibrillator: Secondary | ICD-10-CM | POA: Diagnosis not present

## 2017-12-19 DIAGNOSIS — R4182 Altered mental status, unspecified: Secondary | ICD-10-CM | POA: Diagnosis not present

## 2017-12-19 DIAGNOSIS — Z8679 Personal history of other diseases of the circulatory system: Secondary | ICD-10-CM | POA: Diagnosis not present

## 2017-12-19 DIAGNOSIS — I340 Nonrheumatic mitral (valve) insufficiency: Secondary | ICD-10-CM | POA: Diagnosis not present

## 2017-12-19 DIAGNOSIS — R42 Dizziness and giddiness: Secondary | ICD-10-CM | POA: Diagnosis not present

## 2017-12-19 DIAGNOSIS — I361 Nonrheumatic tricuspid (valve) insufficiency: Secondary | ICD-10-CM | POA: Diagnosis not present

## 2017-12-19 DIAGNOSIS — Z8042 Family history of malignant neoplasm of prostate: Secondary | ICD-10-CM | POA: Diagnosis not present

## 2017-12-19 DIAGNOSIS — Z87891 Personal history of nicotine dependence: Secondary | ICD-10-CM | POA: Diagnosis not present

## 2017-12-19 DIAGNOSIS — G931 Anoxic brain damage, not elsewhere classified: Secondary | ICD-10-CM | POA: Diagnosis not present

## 2017-12-19 DIAGNOSIS — R079 Chest pain, unspecified: Secondary | ICD-10-CM | POA: Diagnosis not present

## 2017-12-19 DIAGNOSIS — Z8674 Personal history of sudden cardiac arrest: Secondary | ICD-10-CM | POA: Diagnosis not present

## 2017-12-19 DIAGNOSIS — Z8249 Family history of ischemic heart disease and other diseases of the circulatory system: Secondary | ICD-10-CM | POA: Diagnosis not present

## 2017-12-20 ENCOUNTER — Ambulatory Visit: Payer: Medicare Other | Admitting: Neurology

## 2017-12-20 DIAGNOSIS — R55 Syncope and collapse: Secondary | ICD-10-CM | POA: Diagnosis not present

## 2017-12-20 DIAGNOSIS — Z8674 Personal history of sudden cardiac arrest: Secondary | ICD-10-CM | POA: Diagnosis not present

## 2017-12-20 DIAGNOSIS — Z95810 Presence of automatic (implantable) cardiac defibrillator: Secondary | ICD-10-CM | POA: Diagnosis not present

## 2017-12-20 DIAGNOSIS — Z8679 Personal history of other diseases of the circulatory system: Secondary | ICD-10-CM | POA: Diagnosis not present

## 2017-12-21 ENCOUNTER — Ambulatory Visit: Payer: Medicare Other | Admitting: Neurology

## 2017-12-23 ENCOUNTER — Ambulatory Visit (INDEPENDENT_AMBULATORY_CARE_PROVIDER_SITE_OTHER): Payer: Medicare Other | Admitting: Family Medicine

## 2017-12-23 ENCOUNTER — Encounter: Payer: Self-pay | Admitting: Family Medicine

## 2017-12-23 VITALS — BP 130/86 | HR 112 | Temp 98.2°F | Ht 67.75 in | Wt 185.8 lb

## 2017-12-23 DIAGNOSIS — Z659 Problem related to unspecified psychosocial circumstances: Secondary | ICD-10-CM | POA: Diagnosis not present

## 2017-12-23 DIAGNOSIS — R55 Syncope and collapse: Secondary | ICD-10-CM

## 2017-12-23 DIAGNOSIS — E039 Hypothyroidism, unspecified: Secondary | ICD-10-CM | POA: Diagnosis not present

## 2017-12-23 NOTE — Patient Instructions (Addendum)
Don't drive until you see cardiology and see what they say.  Don't change your meds for now.  Go to the lab on the way out.  We'll contact you with your lab report. Drink enough fluid to keep your urine clear or light colored.   Take care.  Glad to see you.

## 2017-12-24 LAB — BASIC METABOLIC PANEL
BUN: 11 mg/dL (ref 7–25)
CO2: 26 mmol/L (ref 20–32)
Calcium: 9.8 mg/dL (ref 8.6–10.3)
Chloride: 101 mmol/L (ref 98–110)
Creat: 1.07 mg/dL (ref 0.70–1.25)
Glucose, Bld: 147 mg/dL — ABNORMAL HIGH (ref 65–99)
Potassium: 4.4 mmol/L (ref 3.5–5.3)
Sodium: 139 mmol/L (ref 135–146)

## 2017-12-24 LAB — CBC WITH DIFFERENTIAL/PLATELET
Basophils Absolute: 54 cells/uL (ref 0–200)
Basophils Relative: 0.6 %
Eosinophils Absolute: 126 cells/uL (ref 15–500)
Eosinophils Relative: 1.4 %
HCT: 40.4 % (ref 38.5–50.0)
Hemoglobin: 14.4 g/dL (ref 13.2–17.1)
Lymphs Abs: 2745 cells/uL (ref 850–3900)
MCH: 31.5 pg (ref 27.0–33.0)
MCHC: 35.6 g/dL (ref 32.0–36.0)
MCV: 88.4 fL (ref 80.0–100.0)
MPV: 11.2 fL (ref 7.5–12.5)
Monocytes Relative: 8.1 %
Neutro Abs: 5346 cells/uL (ref 1500–7800)
Neutrophils Relative %: 59.4 %
Platelets: 339 10*3/uL (ref 140–400)
RBC: 4.57 10*6/uL (ref 4.20–5.80)
RDW: 12.2 % (ref 11.0–15.0)
Total Lymphocyte: 30.5 %
WBC mixed population: 729 cells/uL (ref 200–950)
WBC: 9 10*3/uL (ref 3.8–10.8)

## 2017-12-26 ENCOUNTER — Ambulatory Visit (INDEPENDENT_AMBULATORY_CARE_PROVIDER_SITE_OTHER): Payer: Medicare Other | Admitting: *Deleted

## 2017-12-26 ENCOUNTER — Ambulatory Visit (INDEPENDENT_AMBULATORY_CARE_PROVIDER_SITE_OTHER): Payer: Medicare Other | Admitting: Cardiovascular Disease

## 2017-12-26 ENCOUNTER — Encounter: Payer: Self-pay | Admitting: Cardiovascular Disease

## 2017-12-26 ENCOUNTER — Telehealth: Payer: Self-pay | Admitting: Nurse Practitioner

## 2017-12-26 ENCOUNTER — Other Ambulatory Visit: Payer: Self-pay | Admitting: Family Medicine

## 2017-12-26 VITALS — BP 138/86 | HR 89 | Ht 67.75 in | Wt 186.8 lb

## 2017-12-26 DIAGNOSIS — I472 Ventricular tachycardia: Secondary | ICD-10-CM

## 2017-12-26 DIAGNOSIS — R55 Syncope and collapse: Secondary | ICD-10-CM | POA: Insufficient documentation

## 2017-12-26 DIAGNOSIS — I4729 Other ventricular tachycardia: Secondary | ICD-10-CM

## 2017-12-26 DIAGNOSIS — I10 Essential (primary) hypertension: Secondary | ICD-10-CM

## 2017-12-26 DIAGNOSIS — R413 Other amnesia: Secondary | ICD-10-CM

## 2017-12-26 DIAGNOSIS — Z659 Problem related to unspecified psychosocial circumstances: Secondary | ICD-10-CM | POA: Insufficient documentation

## 2017-12-26 LAB — CUP PACEART INCLINIC DEVICE CHECK
Date Time Interrogation Session: 20190624040000
HighPow Impedance: 43 Ohm
HighPow Impedance: 66 Ohm
Implantable Lead Implant Date: 19990204
Implantable Lead Location: 753860
Implantable Lead Model: 135
Implantable Lead Serial Number: 301213
Implantable Pulse Generator Implant Date: 20111228
Lead Channel Impedance Value: 733 Ohm
Lead Channel Pacing Threshold Amplitude: 1.2 V
Lead Channel Pacing Threshold Pulse Width: 1 ms
Lead Channel Sensing Intrinsic Amplitude: 15.6 mV
Lead Channel Setting Pacing Amplitude: 2.4 V
Lead Channel Setting Pacing Pulse Width: 1 ms
Lead Channel Setting Sensing Sensitivity: 0.4 mV
Pulse Gen Serial Number: 277857

## 2017-12-26 MED ORDER — DONEPEZIL HCL 10 MG PO TABS: 10.0000 mg | ORAL_TABLET | Freq: Every day | ORAL | 1 refills | Status: DC

## 2017-12-26 MED ORDER — DILTIAZEM HCL ER COATED BEADS 120 MG PO CP24: 120.0000 mg | ORAL_CAPSULE | Freq: Every day | ORAL | 3 refills | Status: DC

## 2017-12-26 MED ORDER — MONTELUKAST SODIUM 10 MG PO TABS: 10.0000 mg | ORAL_TABLET | Freq: Every day | ORAL | 2 refills | Status: DC

## 2017-12-26 NOTE — Progress Notes (Signed)
ICD check in clinic. Normal device function. Threshold and sensing consistent with previous device measurements. Impedance trends stable over time. No evidence of any ventricular arrhythmias. Histogram distribution appropriate for patient and level of activity. No changes made this session. Device programmed at appropriate safety margins. Device programmed to optimize intrinsic conduction. Estimated longevity 5.5 years. Pt will follow up with DC/B in 3 months. Patient education completed including shock plan.

## 2017-12-26 NOTE — Telephone Encounter (Signed)
Copied from [place] [id number]. Topic: Quick Communication - Rx Refill/Question >> Dec 26, 2017 11:16 AM Burchel, Abbi R wrote: Medication: montelukast (SINGULAIR) 10 MG tablet   Has the patient contacted their pharmacy? No.  Preferred Pharmacy: [place], [place]  [phone] (Phone) [phone] (Fax)   [facility]

## 2017-12-26 NOTE — Telephone Encounter (Signed)
Pt requesting Montelukast to be sent to [facility]. Previous prescription was sent to [facility] order on 10/14/17 #90 with 3 refills. Medication sent to requested pharmacy.

## 2017-12-26 NOTE — Patient Instructions (Signed)

## 2017-12-26 NOTE — Assessment & Plan Note (Addendum)
See above, neg w/u so far with cards eval pending.  Presumed vagal/hypotensive event.  I'll defer to cards o/w.  We faxed inpatient records to cards office. Reasonable to recheck routine labs today.  Okay for outpatient f/u.  I greatly appreciate cards input.  He is still off metoprolol at this point.

## 2017-12-26 NOTE — Assessment & Plan Note (Signed)
We'll recheck TSH later on.  D/w pt.  He agrees.  Continue replacement as is for now.

## 2017-12-26 NOTE — Addendum Note (Signed)
Addended by: Brandon Melnick on: 12/26/2017 11:42 AM   Modules accepted: Orders

## 2017-12-26 NOTE — Progress Notes (Signed)
Cardiology Office Note Date:  12/28/2017   ID:  Larry, [name] 11-01-1950, MRN [id number]  PCP:  [name], MD  Cardiologist:  Sherren Mocha, MD    Chief Complaint  Patient presents with  . Loss of Consciousness     History of Present Illness: Larry [name] is a 67 y.o. male who presents for follow-up evaluation.  He has a remote history of ventricular fibrillation with hypoxic brain injury in 1999.  He underwent ICD implantation.  He was found to have normal coronary arteries at the time of his initial event.  He has had normal LV function by echo assessment.  The patient is here with his wife today. He had a syncopal spell last week in [place]. He was hospitalized and underwent extensive workup with echo, brain imaging, and lab evaluation, all unrevealing. His ICD was interrogated and didn't demonstrate any abnormal readings per their report. His syncopal spell was preceded by lightheadedness and weakness. His wife witnessed the event and was able to wake him up quickly. She poured some cold water on his head. He had a similar spell in March 2019 when he had a GI illness. After his evaluation in [place], his antihypertensives were stopped as he was felt to have a hypotensive event.  He denies chest pain, shortness of breath, leg swelling, or heart palpitations.   Past Medical History:  Diagnosis Date  . ADD (attention deficit disorder)   . Allergy    seasonal flares of perinneal allergies  . Cancer ([place])    melanoma  . Cardiac arrest ([place]) 07/1997   aborted  . Dementia    post resusitative  . Depression   . Diverticulosis   . Family hx of prostate cancer   . GERD (gastroesophageal reflux disease)   . Headache(784.0)   . Hyperlipidemia   . Hypothyroidism   . ICD (implantable cardiac defibrillator) in place   . Idiopathic urticaria   . Internal hemorrhoids   . Low back pain syndrome   . Melanoma in situ of skin of trunk (HCC)    chest  . PVC (premature  ventricular contraction)    associated with cardiac arrest  . Sleep apnea    no c-pap  . Tubular adenoma of colon 10/2010    Past Surgical History:  Procedure Laterality Date  .  implantation x2    . CARDIAC DEFIBRILLATOR PLACEMENT  07/01/2010   Explanation of a previously implanted device, pocket revision, and insertion of a new device, and intraoperative defibrillation threshold testing Caryl Comes)  . CARDIAC DEFIBRILLATOR REMOVAL  2004   Defibrillator change Caryl Comes) 3 times changed  . HEMORRHOID SURGERY    . ICD    . TONSILLECTOMY      Current Outpatient Medications  Medication Sig Dispense Refill  . amphetamine-dextroamphetamine (ADDERALL) 10 MG tablet Take 1 tablet (10 mg total) by mouth 2 (two) times daily. 60 tablet 0  . Ascorbic Acid (VITAMIN C) 500 MG tablet Take 500 mg by mouth daily.      Marland Kitchen aspirin 81 MG tablet Take 81 mg by mouth daily.    Marland Kitchen diltiazem (CARTIA XT) 120 MG 24 hr capsule Take 1 capsule (120 mg total) by mouth daily. 90 capsule 3  . fexofenadine (ALLEGRA) 180 MG tablet Take 180 mg by mouth daily.      . Ginkgo Biloba 40 MG TABS Take by mouth as directed.    Marland Kitchen levothyroxine (SYNTHROID) 75 MCG tablet Take one tablet by mouth daily except  for 2 tabs on Sundays and Wednesdays.    . Melatonin 5 MG CAPS Take by mouth as directed.    . memantine (NAMENDA) 10 MG tablet Take 1 tablet (10 mg total) by mouth 2 (two) times daily. 180 tablet 0  . Multiple Vitamin (MULTIVITAMIN) tablet Take 1 tablet by mouth daily.      . NON FORMULARY Focus factor 1 tab po bid     . rosuvastatin (CRESTOR) 20 MG tablet Take 1 tablet (20 mg total) by mouth at bedtime. 90 tablet 3  . vitamin E 400 UNIT capsule Take 400 Units by mouth daily.      Marland Kitchen donepezil (ARICEPT) 10 MG tablet Take 1 tablet (10 mg total) by mouth daily. 90 tablet 1  . montelukast (SINGULAIR) 10 MG tablet Take 1 tablet (10 mg total) by mouth daily. 90 tablet 2   No current facility-administered medications for this visit.       Allergies:   Penicillins   Social History:  The patient  reports that he quit smoking about 22 years ago. His smoking use included cigarettes. He has a 10.00 pack-year smoking history. He quit smokeless tobacco use about 23 years ago. His smokeless tobacco use included chew. He reports that he does not drink alcohol or use drugs.   Family History:  The patient's  family history includes Alopecia in his father; Cancer in his paternal grandfather; Coronary artery disease in his father; Diabetes in his brother; Diverticulosis in his father; Heart disease in his brother and father; Hypertension in his mother; Lung cancer in his paternal grandfather; Prostate cancer in his brother.    ROS:  Please see the history of present illness.  Otherwise, review of systems is positive for dizziness, fatigue, balance problems.  All other systems are reviewed and negative.    PHYSICAL EXAM: VS:  BP 138/86   Pulse 89   Ht 5' 7.75" (1.721 m)   Wt 186 lb 12 oz (84.7 kg)   SpO2 99%   BMI 28.61 kg/m  , BMI Body mass index is 28.61 kg/m. GEN: Well nourished, well developed, in no acute distress  HEENT: normal  Neck: no JVD, no masses. No carotid bruits Cardiac: RRR without murmur or gallop                Respiratory:  clear to auscultation bilaterally, normal work of breathing GI: soft, nontender, nondistended, + BS MS: no deformity or atrophy  Ext: no pretibial edema, pedal pulses 2+= bilaterally Skin: warm and dry, no rash Neuro:  Strength and sensation are intact Psych: euthymic mood, full affect  EKG:  EKG is not ordered today.  Recent Labs: 08/03/2017: ALT 30 12/14/2017: TSH 5.66 12/23/2017: BUN 11; Creat 1.07; Hemoglobin 14.4; Platelets 339; Potassium 4.4; Sodium 139   Lipid Panel     Component Value Date/Time   CHOL 183 08/03/2017 0819   TRIG 129.0 08/03/2017 0819   HDL 51.60 08/03/2017 0819   CHOLHDL 4 08/03/2017 0819   VLDL 25.8 08/03/2017 0819   LDLCALC 106 (H) 08/03/2017 0819    LDLDIRECT 127.6 03/05/2014 0841      Wt Readings from Last 3 Encounters:  12/26/17 186 lb 12 oz (84.7 kg)  12/23/17 185 lb 12 oz (84.3 kg)  08/12/17 186 lb 12 oz (84.7 kg)     ASSESSMENT AND PLAN: 1.  Syncope: prodrome of lightheadedness and weakness, suggestive of vagal mediated. ICD interrogation demonstrates no arrhythmia. Reportedly echo and telemetry monitoring also normal. Will send  for records. I think best to keep him off of the beta-blocker for now. He is back to feeling well again. He understands the importance of adequate fluid hydration.   2. ICD: device function normal on today's check  3. HTN: BP controlled on diltiazem alone (has been on this many years).   Current medicines are reviewed with the patient today.  The patient does not have concerns regarding medicines.  Labs/ tests ordered today include:  No orders of the defined types were placed in this encounter.   Disposition:   FU 6 months  Signed, Sherren Mocha, MD  12/28/2017 10:11 PM    [address], [place], [place]  [address] Phone: [phone]; Fax: [phone]

## 2017-12-26 NOTE — Telephone Encounter (Signed)
Patient's requesting Rx for  donepezil (ARICEPT) [facility] [place] He has changed pharmacies and has appointment with Dr. Krista Blue on 01-03-18.

## 2017-12-26 NOTE — Progress Notes (Signed)
[place] had [place]ope with antecedent warning sx.  No defibrillator firing.  He felt like he was going to pass out and sat down.  Family attended to him.  No SZ activity seen.  Transported to hospital.  Admitted.  Routine w/u with echo and defibrillator interrogation done, along with imaging- unremarkable.  Thought to have orthostatic hypotension and metoprolol held. Discharged home to f/u here and with cards.    He has had mult episodes of near syncope, per patient report.  He isn't driving at least until he can see cards next week- I advised him not to drive until cards approval.  Still on BB.    Noted that he has inc in TSH but isn't due for recheck yet.  We recently inc'd his dose but that shouldn't have caused the event and he has some episodes of presyncope that predate dose change.    No CP, not SOB.  Still on baseline meds o/w.    Noted high stress change with father committing suicide this spring.  D/w pt.  No SI/HI.  We talked about potential anxiety med if needed but I want cards input prior to making nay med changes.  Pt agrees with that.    PMH and SH reviewed  ROS: Per HPI unless specifically indicated in ROS section   Meds, vitals, and allergies reviewed.   GEN: nad, alert and oriented HEENT: mucous membranes moist NECK: supple w/o LA CV: rrr. PULM: ctab, no inc wob ABD: soft, +bs EXT: no edema SKIN: no acute rash

## 2017-12-26 NOTE — Assessment & Plan Note (Signed)
Noted high stress change with father committing suicide this spring.  D/w pt.  No SI/HI.  We talked about potential anxiety med if needed but I want cards input prior to making nay med changes.  Pt agrees with that.   >25 minutes spent in face to face time with patient, >50% spent in counselling or coordination of care, discussing inpatient course, hx, etc.

## 2017-12-28 ENCOUNTER — Encounter: Payer: Self-pay | Admitting: Cardiovascular Disease

## 2018-01-03 ENCOUNTER — Ambulatory Visit: Payer: Medicare Other | Admitting: Neurology

## 2018-01-09 ENCOUNTER — Other Ambulatory Visit: Payer: Self-pay | Admitting: Family Medicine

## 2018-01-09 NOTE — Telephone Encounter (Signed)
Copied from [place] [id number]. Topic: Quick Communication - Rx Refill/Question >> Jan 09, 2018  9:19 AM Robina Ade, Helene Kelp D wrote: Medication:amphetamine-dextroamphetamine (ADDERALL) 10 MG tablet for a 90 day supply.  Has the patient contacted their pharmacy? Yes, pt would like med refill e-script. (Agent: If no, request that the patient contact the pharmacy for the refill.) (Agent: If yes, when and what did the pharmacy advise?)  Preferred Pharmacy (with phone number or street name):[facility], [place]  Agent: Please be advised that RX refills may take up to 3 business days. We ask that you follow-up with your pharmacy.

## 2018-01-09 NOTE — Telephone Encounter (Signed)
Adderall  10 mg refill. Pt requesting a 90 day supply Last Refill:12/17/17 #60 Last OV: 12/23/17 PCP: Dr. Damita Dunnings [facility]: [facility]

## 2018-01-09 NOTE — Telephone Encounter (Signed)
Name of Medication: Adderall Name of Pharmacy: [facility] or Written Date and Quantity: 12/17/17 #60 Last Office Visit and Type: 12/23/17/ syncope Next Office Visit and Type: 08/14/18 CPE Last Controlled Substance Agreement Date: 04/18/15 Last UDS:04/18/15

## 2018-01-10 MED ORDER — AMPHETAMINE-DEXTROAMPHETAMINE 10 MG PO TABS: 10.0000 mg | ORAL_TABLET | Freq: Two times a day (BID) | ORAL | 0 refills | Status: DC

## 2018-01-10 NOTE — Telephone Encounter (Signed)
Sent. Thanks.   

## 2018-02-14 ENCOUNTER — Other Ambulatory Visit (INDEPENDENT_AMBULATORY_CARE_PROVIDER_SITE_OTHER): Payer: Medicare Other

## 2018-02-14 DIAGNOSIS — E039 Hypothyroidism, unspecified: Secondary | ICD-10-CM | POA: Diagnosis not present

## 2018-02-14 LAB — TSH: TSH: 0.8 u[IU]/mL (ref 0.35–4.50)

## 2018-02-16 ENCOUNTER — Other Ambulatory Visit: Payer: Self-pay | Admitting: *Deleted

## 2018-02-24 ENCOUNTER — Other Ambulatory Visit: Payer: Self-pay | Admitting: Family Medicine

## 2018-02-24 NOTE — Telephone Encounter (Signed)
Copied from [place] [id number]. Topic: Quick Communication - See Telephone Encounter >> Feb 24, 2018  9:43 AM Mylinda Latina, NT wrote: CRM for notification. See Telephone encounter for: 02/24/18. mPatient called and states he needs a refill of his amphetamine-dextroamphetamine (ADDERALL) 10 MG tablet. [facility]    [id number] [place], [place] [phone] (Phone) [phone] (Fax)

## 2018-02-24 NOTE — Telephone Encounter (Signed)
Name of Medication: Adderall 10 mg Name of Pharmacy[facility] or Written Date and Quantity: # 79 on 01/10/18 Last Office Visit and Type: 12/23/17 HFU Next Office Visit and Type: 08/14/18 CPX

## 2018-02-24 NOTE — Telephone Encounter (Signed)
Adderall 10 mg refill  Controlled substance Last Refill:01/10/18 # 60 Last OV: 12/23/17 PCP: Damita Dunnings [id number]

## 2018-02-26 MED ORDER — AMPHETAMINE-DEXTROAMPHETAMINE 10 MG PO TABS: 10.0000 mg | ORAL_TABLET | Freq: Two times a day (BID) | ORAL | 0 refills | Status: DC

## 2018-02-26 NOTE — Telephone Encounter (Addendum)
I am unable to send controlled meds electronically- the program to allow that is current not functional.  He'll have to pick up rx.  I am working to get the program corrected.   Printed.  Thanks.

## 2018-02-27 ENCOUNTER — Telehealth: Payer: Self-pay | Admitting: Family Medicine

## 2018-02-27 ENCOUNTER — Encounter

## 2018-02-27 ENCOUNTER — Ambulatory Visit: Payer: Medicare Other | Admitting: Neurology

## 2018-02-27 NOTE — Telephone Encounter (Signed)
Patient advised.  Rx left at front desk for pick up. 

## 2018-02-27 NOTE — Telephone Encounter (Signed)
Please check with patient.  If he needs 3 of the rx for 1 month each let me know.  If the pharmacy will fill a 90 day supply, I can work on that.  Let me know. Thanks.

## 2018-02-27 NOTE — Telephone Encounter (Signed)
Patient is going to find out if they will accept a 90 day Rx or if it will need to be 3 rx's for 30 days and will call back.

## 2018-02-27 NOTE — Telephone Encounter (Signed)
Pt is requesting a call to ask about adderall prescription. [facility] 90 days at a time if Damita Dunnings will prescribe that instead. States you can reach him on cell phone. [phone]

## 2018-03-02 ENCOUNTER — Encounter: Payer: Self-pay | Admitting: Neurology

## 2018-03-02 ENCOUNTER — Ambulatory Visit (INDEPENDENT_AMBULATORY_CARE_PROVIDER_SITE_OTHER): Payer: Medicare Other | Admitting: Neurology

## 2018-03-02 VITALS — BP 145/90 | HR 100 | Ht 67.75 in | Wt 185.5 lb

## 2018-03-02 DIAGNOSIS — G931 Anoxic brain damage, not elsewhere classified: Secondary | ICD-10-CM

## 2018-03-02 DIAGNOSIS — R413 Other amnesia: Secondary | ICD-10-CM | POA: Diagnosis not present

## 2018-03-02 DIAGNOSIS — R569 Unspecified convulsions: Secondary | ICD-10-CM

## 2018-03-02 MED ORDER — DONEPEZIL HCL 10 MG PO TABS: 10.0000 mg | ORAL_TABLET | Freq: Every day | ORAL | 4 refills | Status: DC

## 2018-03-02 MED ORDER — LAMOTRIGINE 25 MG PO TABS: ORAL_TABLET | ORAL | 0 refills | Status: DC

## 2018-03-02 MED ORDER — LAMOTRIGINE 100 MG PO TABS: 100.0000 mg | ORAL_TABLET | Freq: Two times a day (BID) | ORAL | 4 refills | Status: DC

## 2018-03-02 MED ORDER — MEMANTINE HCL 10 MG PO TABS: 10.0000 mg | ORAL_TABLET | Freq: Two times a day (BID) | ORAL | 0 refills | Status: DC

## 2018-03-02 MED ORDER — MEMANTINE HCL 10 MG PO TABS: 10.0000 mg | ORAL_TABLET | Freq: Two times a day (BID) | ORAL | 4 refills | Status: DC

## 2018-03-02 NOTE — Progress Notes (Signed)
[facility]  PATIENT: Larry [name] DOB: [birth year]   REASON FOR VISIT: Follow-up for memory loss , insomnia HISTORY FROM: Patient and wife  HISTORY OF PRESENT ILLNESS:[name] is a very friendly 67 year old right-handed gentleman presents for followup consultation of his memory loss in the context of anoxic brain injury.   He is accompanied by his wife today. He was a patient of Dr. 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  EEG showed diffuse slowing in January 1999, CT head and brain MRI were normal. CT head repeat in November 2000 showed slight prominence of the lateral and third ventricles. CT head in July 2003 showed prominence of the ventricular system. Psychological testing and August 1999 showed borderline performance IQ. He became depressed. In January 2014 his MMSE was 29, clock drawing was 4, animal fluency was 10. He went to [facility] for 2 years for patient with degenerative disorder.  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 He lives at home with his wife, still driving. He helps the household, he still has difficulty focusing.   He has had some fluctuation in his memory per wife. He has ADD and continues to take Adderall, 10 mg twice a day, without Adderall, he could not remember the pages that he has read,he is also taking Aricept 10 mg every day, which has been very helpful too.  Up date April 14 2016YY  His overall doing very well, he likes play computer games, which has helped his eye and hands coordination, he had recent trip to  [place], felt overwhelming, he continued to drive short distance,  He is taking Ritalin 10 mg twice a day, Aricept 10 mg once a day, complains of insomnia, he took his last dose of Ritalin at evening time.  UPDATE Apr 21 2015:YY He is now taking Namenda 10mg  bid and aricept 10mg  qday, he still has trouble sleeping,  He watch movie, stay up late, he continues to have focus difficulty, tends to become agitated.  UPDATE April 27th 2017: YY He still has chronic insomnia, tends to have irregular sleep pattern, stay up late watching TV, he still very active during the day, take Adderall 10 mg twice a day, still drives short distance, mild unsteady gait, tends to hold his left arm to his chest  UPDATE Oct 17th 2017:YY He is with his wife,  Her daughter was hospital, presented with sudden loss of consciousness yesterday April 19 2016, which has brought back a lot of his memory, he has not tried clonazepam for chronic insomnia, worry about side effect, tried melatonin, still has significant insomnia, he is taking Tylenol pm,   UPDATE March 02 2018: He was follow-up by Chrys Racer in 2018, on December 19, 2017, visiting his daughter at [place], in the morning time, he had sudden onset dizziness, feels like going to pass out, then had transient loss of consciousness, eyes rolled back, staring, lasting for 1 minutes, followed by postevent mild confusion and slurred speech last about 40 minutes, was treated at local hospital, I personally reviewed CT head without contrast: There was no acute abnormality, generalized atrophy, ventriculomegaly,  laboratory evaluation, EKG showed no significant abnormality.  Similar spells in March 2019, transient dizziness followed by sudden loss of muscle tone, consciousness, extreme fatigue, slept for 1 hour afterwards,  Patient reported that he actually has frequent spells about once a month, he has transient confusion, feels like he is going to pass out, short  lasting,  REVIEW OF SYSTEMS: Full 14 system review of systems performed and notable only for those listed, all others are neg:  As above   ALLERGIES: Allergies  Allergen Reactions  . Penicillins     Difficult Breathing, rash    HOME MEDICATIONS: Outpatient Medications Prior to Visit  Medication Sig Dispense Refill  . amphetamine-dextroamphetamine (ADDERALL) 10 MG tablet Take 1 tablet (10 mg total) by mouth 2 (two) times daily. 60 tablet 0  . Ascorbic Acid (VITAMIN C) 500 MG tablet Take 500 mg by mouth daily.      Marland Kitchen aspirin 81 MG tablet Take 81 mg by mouth daily.    Marland Kitchen diltiazem (CARTIA XT) 120 MG 24 hr capsule Take 1 capsule (120 mg total) by mouth daily. 90 capsule 3  . donepezil (ARICEPT) 10 MG tablet Take 1 tablet (10 mg total) by mouth daily. 90 tablet 1  . fexofenadine (ALLEGRA) 180 MG tablet Take 180 mg by mouth daily.      . Ginkgo Biloba 40 MG TABS Take by mouth as directed.    Marland Kitchen levothyroxine (SYNTHROID) 75 MCG tablet Take one tablet by mouth daily except for 2 tabs on Sundays and Wednesdays.    . Melatonin 5 MG CAPS Take by mouth as directed.    . memantine (NAMENDA) 10 MG tablet Take 1 tablet (10 mg total) by mouth 2 (two) times daily. 180 tablet 0  . METOPROLOL TARTRATE PO Take by mouth. Unsure of dosage. Taking 0.5 tab BID.    Marland Kitchen montelukast (SINGULAIR) 10 MG tablet Take 1 tablet (10 mg total) by mouth daily. 90 tablet 2  . Multiple Vitamin (MULTIVITAMIN) tablet Take 1 tablet by mouth daily.      . NON FORMULARY Focus factor 1 tab po bid     . rosuvastatin (CRESTOR) 20 MG tablet Take 1 tablet (20 mg total) by mouth at bedtime. 90 tablet 3  . vitamin E 400 UNIT capsule Take 400 Units by mouth daily.       No facility-administered medications prior to visit.     PAST MEDICAL HISTORY: Past Medical History:  Diagnosis Date  . ADD (attention deficit disorder)   . Allergy    seasonal flares of perinneal allergies  . Cancer ([place])    melanoma  . Cardiac arrest ([place])  07/1997   aborted  . Dementia    post resusitative  . Depression   . Diverticulosis   . Family hx of prostate cancer   . GERD (gastroesophageal reflux disease)   . Headache(784.0)   . Hyperlipidemia   . Hypothyroidism   . ICD (implantable cardiac defibrillator) in place   . Idiopathic urticaria   . Internal hemorrhoids   . Low back pain syndrome   . Melanoma in situ of skin of trunk (HCC)    chest  . PVC (premature ventricular contraction)    associated with cardiac arrest  . Sleep apnea    no c-pap  . Tubular adenoma of colon 10/2010    PAST SURGICAL HISTORY: Past Surgical History:  Procedure Laterality Date  .  implantation x2    . CARDIAC DEFIBRILLATOR PLACEMENT  07/01/2010   Explanation  of a previously implanted device, pocket revision, and insertion of a new device, and intraoperative defibrillation threshold testing Caryl Comes)  . CARDIAC DEFIBRILLATOR REMOVAL  2004   Defibrillator change Caryl Comes) 3 times changed  . HEMORRHOID SURGERY    . ICD    . TONSILLECTOMY      FAMILY HISTORY: Family History  Problem Relation Age of Onset  . Hypertension Mother   . Heart disease Father   . Diverticulosis Father   . Coronary artery disease Father   . Alopecia Father   . Prostate cancer Brother   . Lung cancer Paternal Grandfather        Smoker  . Cancer Paternal Grandfather        lung cancer - smoker  . Diabetes Brother   . Heart disease Brother        heart transplant  . Colon cancer Neg Hx     SOCIAL HISTORY: Social History   Socioeconomic History  . Marital status: Married    Spouse name: [name]  . Number of children: 2  . Years of education: College  . Highest education level: Not on file  Occupational History    Employer: DISABLED  Social Needs  . Financial resource strain: Not on file  . Food insecurity:    Worry: Not on file    Inability: Not on file  . Transportation needs:    Medical: Not on file    Non-medical: Not on file  Tobacco Use  .  Smoking status: Former Smoker    Packs/day: 1.00    Years: 10.00    Pack years: 10.00    Types: Cigarettes    Last attempt to quit: 07/06/1995    Years since quitting: 22.6  . Smokeless tobacco: Former Systems developer    Types: [place] date: 11/04/1994  Substance and Sexual Activity  . Alcohol use: No    Alcohol/week: 0.0 standard drinks    Comment: socially  . Drug use: No  . Sexual activity: Not on file  Lifestyle  . Physical activity:    Days per week: Not on file    Minutes per session: Not on file  . Stress: Not on file  Relationships  . Social connections:    Talks on phone: Not on file    Gets together: Not on file    Attends religious service: Not on file    Active member of club or organization: Not on file    Attends meetings of clubs or organizations: Not on file    Relationship status: Not on file  . Intimate partner violence:    Fear of current or ex partner: Not on file    Emotionally abused: Not on file    Physically abused: Not on file    Forced sexual activity: Not on file  Other Topics Concern  . Not on file  Social History Narrative   Patient is married [name]) [phone] with 2 daughters   5 grandchildren   Daily caffeine use - one cup of coffee and one-two cokes per day   Patient is right-handed.   Patient has a college education     PHYSICAL EXAM  Vitals:   03/02/18 0840  BP: (!) 145/90  Pulse: 100  Weight: 185 lb 8 oz (84.1 kg)  Height: 5' 7.75" (1.721 m)   Body mass index is 28.41 kg/m.  Generalized: Well developed, in no acute distress  Head: normocephalic and atraumatic,. Oropharynx benign  Neck: Supple, no carotid bruits  Cardiac: Regular rate rhythm, no murmur  Musculoskeletal: No deformity   Neurological examination   Mentation: Alert  MMSE - Mini Mental State Exam 03/02/2018 08/03/2017 06/21/2017  Orientation to time 4 5 5   Orientation to Place 5 5 5   Registration 3 3 3   Attention/ Calculation 5 0 5  Recall 1 0 1  Recall-comments -  unable to recall 3 of 3 words -  Language- name 2 objects 2 0 2  Language- repeat 1 1 0  Language- follow 3 step command 3 3 3   Language- read & follow direction 1 0 1  Write a sentence 1 0 1  Copy design 1 0 1  Total score 27 17 27    Follows all commands speech and language fluent.  Animal naming 10  Cranial nerve II-XII: Pupils were equal round reactive to light extraocular movements were full, visual field were full on confrontational test. Facial sensation and strength were normal. hearing was intact to finger rubbing bilaterally. Uvula tongue midline. head turning and shoulder shrug were normal and symmetric.Tongue protrusion into cheek strength was normal. Motor: normal bulk and tone, full strength in the BUE, BLE, fine finger movements normal, no pronator drift. No focal weakness Sensory: normal and symmetric to light touch, pinprick, and  Vibration, in the upper and lower extremities Coordination: finger-nose-finger, heel-to-shin bilaterally, no dysmetria Reflexes: Symmetric upper and lower plantar responses were flexor bilaterally. Gait and Station: Rising up from seated position without assistance, tends to hold left arm to abdomen  DIAGNOSTIC DATA (LABS, IMAGING, TESTING) - I reviewed patient records, labs, notes, testing and imaging myself where available.    Lab Results  Component Value Date   TSH 0.80 02/14/2018      ASSESSMENT AND PLAN  67 y.o. year old male  Anoxic brain injury in 1999 Mild cognitive impairment  Refill Aricept 10 mg daily, Namenda 10 mg twice a day Seizure  Most recent seizure was on December 19 2017.  CT head without contrast showed no acute abnormality, mild generalized atrophy, ventriculomegaly,  EEG  No driving until episode free for 6 months  Lamotrigine, titrating to 100 mg twice a day    Marcial Pacas, M.D. Ph.D.  [facility] [place], [place] [address] Phone: [phone] Fax:      [phone]

## 2018-03-08 ENCOUNTER — Other Ambulatory Visit: Payer: Self-pay | Admitting: Family Medicine

## 2018-03-08 MED ORDER — LEVOTHYROXINE SODIUM 75 MCG PO TABS: ORAL_TABLET | ORAL | 1 refills | Status: DC

## 2018-03-08 NOTE — Telephone Encounter (Signed)
I spoke with pt and wants refill to levothyroxine [facility] mcg to [facility] because does not pay a copay at local pharmacy. Advised pt done.

## 2018-03-08 NOTE — Telephone Encounter (Signed)
Copied from [place] [id number]. Topic: Quick Communication - Rx Refill/Question >> Mar 08, 2018  8:07 AM Burchel, Abbi R wrote: Medication: levothyroxine (SYNTHROID) 75 MCG tablet   Preferred Pharmacy: [id number] [place], [place] [place] [address] Phone: [phone] Fax: [phone]   Pt was advised that RX refills may take up to 3 business days.

## 2018-03-08 NOTE — Telephone Encounter (Signed)
Levothyroxine 75 mcg  refill Last Refill:12/15/17 # ? Last OV: 12/23/17 PCP: Dr. Damita Dunnings [id number]  Last TSH  02/14/18 was 0.80  Please review.

## 2018-03-13 ENCOUNTER — Ambulatory Visit (INDEPENDENT_AMBULATORY_CARE_PROVIDER_SITE_OTHER): Payer: Medicare Other | Admitting: Neurology

## 2018-03-13 DIAGNOSIS — G931 Anoxic brain damage, not elsewhere classified: Secondary | ICD-10-CM

## 2018-03-13 DIAGNOSIS — R413 Other amnesia: Secondary | ICD-10-CM

## 2018-03-13 DIAGNOSIS — R569 Unspecified convulsions: Secondary | ICD-10-CM

## 2018-03-17 NOTE — Procedures (Signed)
   HISTORY: 67 years old male with history of anoxic brain injury, memory loss, seizure,  TECHNIQUE:  16 channel EEG was performed based on standard 10-16 international system. One channel was dedicated to EKG, which has demonstrates normal sinus rhythm of beats per minutes.  Upon awakening, the posterior background activity was well-developed, in alpha range, reactive to eye opening and closure.  There was no evidence of epileptiform discharge.  Photic stimulation was performed, which induced a symmetric photic driving.  Hyperventilation was not performed.  Patient was drowsy during the recording, but no deeper stage of sleep was achieved.  CONCLUSION: This is a  normal awake EEG.  There is no electrodiagnostic evidence of epileptiform discharge.  Marcial Pacas, M.D. Ph.D.  [facility] [place], [place] [address] Phone: [phone] Fax:      [phone]

## 2018-03-20 ENCOUNTER — Telehealth: Payer: Self-pay | Admitting: Neurology

## 2018-03-20 NOTE — Telephone Encounter (Signed)
Spoke with [name] and explained EEG done in our office was normal.  She verbalized understanding of same, sts. pt. is doing better, feeling better and has not had any more "spells."/fim

## 2018-03-20 NOTE — Telephone Encounter (Signed)
Pt's wife Kathy/DPR request EEG results.

## 2018-03-22 ENCOUNTER — Other Ambulatory Visit: Payer: Self-pay | Admitting: Family Medicine

## 2018-03-22 NOTE — Telephone Encounter (Signed)
Copied from [place] [id number]. Topic: Quick Communication - Rx Refill/Question >> Mar 22, 2018  9:14 AM Margot Ables wrote: Medication: amphetamine-dextroamphetamine (ADDERALL) 10 MG tablet - 9 days left - requesting early as not to run out - please send electronically Question - can this medication be ordered 90 day supply thru [facility]? Please notify pt  Has the patient contacted their pharmacy? No - controlled Preferred Pharmacy (with phone number or street name): [id number] [place], [place] [phone] (Phone) [phone] (Fax)

## 2018-03-23 NOTE — Telephone Encounter (Signed)
Too early for Rx. Per Dr Damita Dunnings, ok to wait until 9/23. Due 9/25. Pt is also wanting to know if he can have 90d supply

## 2018-03-24 NOTE — Telephone Encounter (Signed)
Can the patient get a 90 day supply through [facility]?  If so, he will be due for the RF by the time it arrives.

## 2018-03-26 MED ORDER — AMPHETAMINE-DEXTROAMPHETAMINE 10 MG PO TABS: 10.0000 mg | ORAL_TABLET | Freq: Two times a day (BID) | ORAL | 0 refills | Status: DC

## 2018-03-26 NOTE — Telephone Encounter (Signed)
90 day supply (#180) [facility].  Unclear if they will send 90 or 30 day supply.  Let patient know.  Thanks.

## 2018-03-27 NOTE — Telephone Encounter (Signed)
Patient notified as instructed by telephone and verbalized understanding. 

## 2018-03-29 ENCOUNTER — Other Ambulatory Visit: Payer: Self-pay | Admitting: Neurology

## 2018-04-03 ENCOUNTER — Other Ambulatory Visit: Payer: Self-pay | Admitting: Neurology

## 2018-04-20 ENCOUNTER — Ambulatory Visit (INDEPENDENT_AMBULATORY_CARE_PROVIDER_SITE_OTHER): Payer: Medicare Other

## 2018-04-20 DIAGNOSIS — Z23 Encounter for immunization: Secondary | ICD-10-CM

## 2018-05-19 DIAGNOSIS — L570 Actinic keratosis: Secondary | ICD-10-CM | POA: Diagnosis not present

## 2018-05-19 DIAGNOSIS — C44319 Basal cell carcinoma of skin of other parts of face: Secondary | ICD-10-CM | POA: Diagnosis not present

## 2018-05-19 DIAGNOSIS — X32XXXD Exposure to sunlight, subsequent encounter: Secondary | ICD-10-CM | POA: Diagnosis not present

## 2018-05-22 ENCOUNTER — Other Ambulatory Visit: Payer: Self-pay | Admitting: Cardiovascular Disease

## 2018-06-02 ENCOUNTER — Other Ambulatory Visit: Payer: Self-pay | Admitting: Family Medicine

## 2018-06-05 ENCOUNTER — Encounter: Payer: Self-pay | Admitting: Neurology

## 2018-06-05 ENCOUNTER — Ambulatory Visit (INDEPENDENT_AMBULATORY_CARE_PROVIDER_SITE_OTHER): Payer: Medicare Other | Admitting: Neurology

## 2018-06-05 VITALS — BP 153/97 | HR 96 | Ht 67.75 in | Wt 186.0 lb

## 2018-06-05 DIAGNOSIS — R569 Unspecified convulsions: Secondary | ICD-10-CM

## 2018-06-05 DIAGNOSIS — G931 Anoxic brain damage, not elsewhere classified: Secondary | ICD-10-CM | POA: Diagnosis not present

## 2018-06-05 MED ORDER — LAMOTRIGINE 100 MG PO TABS: 100.0000 mg | ORAL_TABLET | Freq: Two times a day (BID) | ORAL | 4 refills | Status: DC

## 2018-06-05 NOTE — Progress Notes (Signed)
[facility]  PATIENT: Larry [name] DOB: 06-03-1951    HISTORY OF PRESENT ILLNESS:[name] is a very friendly 67 year old right-handed gentleman presents for followup consultation of his memory loss in the context of anoxic brain injury.   He is accompanied by his wife today. He was a patient of Dr. Erling [name], The patient has an underlying medical history of cardiac death from ventricular fibrillation with hypoxic brain injury in Jan 1999 due to arrythmia, 15 minutes without heart beat, status post defibrillator placement, He stayed in hospital for 35 days, required prolonged rehabilitation, has to relearn walking again, significant amnesia, "as if that he did not have a past".  EEG showed diffuse slowing in January 1999, CT head and brain MRI were normal. CT head repeat in November 2000 showed slight prominence of the lateral and third ventricles. CT head in July 2003 showed prominence of the ventricular system. Psychological testing and August 1999 showed borderline performance IQ. He became depressed. In January 2014 his MMSE was 29, clock drawing was 4, animal fluency was 10. He went to [facility] for 2 years for patient with degenerative disorder.  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 He lives at home with his wife, still driving. He helps the household, he still has difficulty focusing.   He has had some fluctuation in his memory per wife. He has ADD and continues to take Adderall, 10 mg twice a day, without Adderall, he could not remember the pages that he has read,he is also taking Aricept 10 mg every day, which has been very helpful too.  Up date April 14 2016YY  His overall doing very well, he likes play computer games, which has helped his eye and hands coordination, he had recent trip to [place], felt overwhelming, he continued to drive short distance,  He is taking  Ritalin 10 mg twice a day, Aricept 10 mg once a day, complains of insomnia, he took his last dose of Ritalin at evening time.  UPDATE Apr 21 2015:YY He is now taking Namenda 10mg  bid and aricept 10mg  qday, he still has trouble sleeping,  He watch movie, stay up late, he continues to have focus difficulty, tends to become agitated.  UPDATE April 27th 2017: YY He still has chronic insomnia, tends to have irregular sleep pattern, stay up late watching TV, he still very active during the day, take Adderall 10 mg twice a day, still drives short distance, mild unsteady gait, tends to hold his left arm to his chest  UPDATE Oct 17th 2017:YY He is with his wife,  Her daughter was hospital, presented with sudden loss of consciousness yesterday April 19 2016, which has brought back a lot of his memory, he has not tried clonazepam for chronic insomnia, worry about side effect, tried melatonin, still has significant insomnia, he is taking Tylenol pm,   UPDATE March 02 2018: He was follow-up by Chrys Racer in 2018, on December 19, 2017, visiting his daughter at [place], in the morning time, he had sudden onset dizziness, feels like going to pass out, then had transient loss of consciousness, eyes rolled back, staring, lasting for 1 minutes, followed by postevent mild confusion and slurred speech last about 40 minutes, was treated at local hospital.  I personally reviewed CT head without contrast: There was no acute abnormality, generalized atrophy, ventriculomegaly,  laboratory evaluation, EKG showed no significant abnormality.  Similar spells in March 2019,  transient dizziness followed by sudden loss of muscle tone, consciousness, extreme fatigue, slept for 1 hour afterwards,  Patient reported that he actually has frequent spells about once a month, he has transient confusion, feels like he is going to pass out, short lasting,  UPDATE Jun 05 2018: He tolerated lamotrigine very well, taking 100 mg twice a day  without significant side effect,  EEG was normal.  Since lamotrigine, he no longer has recurrent spells of rising sensation transient confusion even though there was no clinical seizure activity observed during those spells, he had those spells about once a week prior to lamotrigine,  REVIEW OF SYSTEMS: Full 14 system review of systems performed and notable only for those listed, all others are neg:  As above   ALLERGIES: Allergies  Allergen Reactions  . Penicillins     Difficult Breathing, rash    HOME MEDICATIONS: Outpatient Medications Prior to Visit  Medication Sig Dispense Refill  . amphetamine-dextroamphetamine (ADDERALL) 10 MG tablet Take 1 tablet (10 mg total) by mouth 2 (two) times daily. 180 tablet 0  . Ascorbic Acid (VITAMIN C) 500 MG tablet Take 500 mg by mouth daily.      Marland Kitchen aspirin 81 MG tablet Take 81 mg by mouth daily.    Marland Kitchen diltiazem (CARTIA XT) 120 MG 24 hr capsule Take 1 capsule (120 mg total) by mouth daily. 90 capsule 3  . donepezil (ARICEPT) 10 MG tablet Take 1 tablet (10 mg total) by mouth daily. 90 tablet 4  . fexofenadine (ALLEGRA) 180 MG tablet Take 180 mg by mouth daily.      . Ginkgo Biloba 40 MG TABS Take by mouth as directed.    . lamoTRIgine (LAMICTAL) 100 MG tablet Take 1 tablet (100 mg total) by mouth 2 (two) times daily. 180 tablet 4  . lamoTRIgine (LAMICTAL) 25 MG tablet 1 tablet twice a day for the first week 2 tablets twice a day for the second week 3 tablets twice a day for the third week 4 tablets twice a day for the fourth week  For total of 140 tablets  After finish titration with small dose of lamotrigine 25 mg, change to lamotrigine 100 mg twice a day 140 tablet 0  . levothyroxine (SYNTHROID) 75 MCG tablet Take one tablet by mouth daily except for 2 tabs on Sundays and Wednesdays. 120 tablet 1  . Melatonin 5 MG CAPS Take by mouth as directed.    . memantine (NAMENDA) 10 MG tablet Take 1 tablet (10 mg total) by mouth 2 (two) times daily. 180  tablet 4  . metoprolol tartrate (LOPRESSOR) 25 MG tablet Take 1 tablet (25 mg total) by mouth 2 (two) times daily. 180 tablet 1  . montelukast (SINGULAIR) 10 MG tablet Take 1 tablet (10 mg total) by mouth daily. 90 tablet 2  . Multiple Vitamin (MULTIVITAMIN) tablet Take 1 tablet by mouth daily.      . NON FORMULARY Focus factor 1 tab po bid     . rosuvastatin (CRESTOR) 20 MG tablet Take 1 tablet (20 mg total) by mouth at bedtime. 90 tablet 3  . vitamin E 400 UNIT capsule Take 400 Units by mouth daily.       No facility-administered medications prior to visit.     PAST MEDICAL HISTORY: Past Medical History:  Diagnosis Date  . ADD (attention deficit disorder)   . Allergy    seasonal flares of perinneal allergies  . Cancer ([place])    melanoma  . Cardiac  arrest ([place]) 07/1997   aborted  . Dementia ([place])    post resusitative  . Depression   . Diverticulosis   . Family hx of prostate cancer   . GERD (gastroesophageal reflux disease)   . Headache(784.0)   . Hyperlipidemia   . Hypothyroidism   . ICD (implantable cardiac defibrillator) in place   . Idiopathic urticaria   . Internal hemorrhoids   . Low back pain syndrome   . Melanoma in situ of skin of trunk (HCC)    chest  . PVC (premature ventricular contraction)    associated with cardiac arrest  . Sleep apnea    no c-pap  . Tubular adenoma of colon 10/2010    PAST SURGICAL HISTORY: Past Surgical History:  Procedure Laterality Date  .  implantation x2    . CARDIAC DEFIBRILLATOR PLACEMENT  07/01/2010   Explanation of a previously implanted device, pocket revision, and insertion of a new device, and intraoperative defibrillation threshold testing Caryl Comes)  . CARDIAC DEFIBRILLATOR REMOVAL  2004   Defibrillator change Caryl Comes) 3 times changed  . HEMORRHOID SURGERY    . ICD    . TONSILLECTOMY      FAMILY HISTORY: Family History  Problem Relation Age of Onset  . Hypertension Mother   . Heart disease Father   .  Diverticulosis Father   . Coronary artery disease Father   . Alopecia Father   . Prostate cancer Brother   . Lung cancer Paternal Grandfather        Smoker  . Cancer Paternal Grandfather        lung cancer - smoker  . Diabetes Brother   . Heart disease Brother        heart transplant  . Colon cancer Neg Hx     SOCIAL HISTORY: Social History   Socioeconomic History  . Marital status: Married    Spouse name: [name]  . Number of children: 2  . Years of education: College  . Highest education level: Not on file  Occupational History    Employer: DISABLED  Social Needs  . Financial resource strain: Not on file  . Food insecurity:    Worry: Not on file    Inability: Not on file  . Transportation needs:    Medical: Not on file    Non-medical: Not on file  Tobacco Use  . Smoking status: Former Smoker    Packs/day: 1.00    Years: 10.00    Pack years: 10.00    Types: Cigarettes    Last attempt to quit: 07/06/1995    Years since quitting: 22.9  . Smokeless tobacco: Former Systems developer    Types: [place] date: 11/04/1994  Substance and Sexual Activity  . Alcohol use: No    Alcohol/week: 0.0 standard drinks    Comment: socially  . Drug use: No  . Sexual activity: Not on file  Lifestyle  . Physical activity:    Days per week: Not on file    Minutes per session: Not on file  . Stress: Not on file  Relationships  . Social connections:    Talks on phone: Not on file    Gets together: Not on file    Attends religious service: Not on file    Active member of club or organization: Not on file    Attends meetings of clubs or organizations: Not on file    Relationship status: Not on file  . Intimate partner violence:    Fear of  current or ex partner: Not on file    Emotionally abused: Not on file    Physically abused: Not on file    Forced sexual activity: Not on file  Other Topics Concern  . Not on file  Social History Narrative   Patient is married [name]) [phone] with 2  daughters   5 grandchildren   Daily caffeine use - one cup of coffee and one-two cokes per day   Patient is right-handed.   Patient has a college education     PHYSICAL EXAM  Vitals:   06/05/18 0910  BP: (!) 153/97  Pulse: 96  Weight: 186 lb (84.4 kg)  Height: 5' 7.75" (1.721 m)   Body mass index is 28.49 kg/m.  Generalized: Well developed, in no acute distress  Head: normocephalic and atraumatic,. Oropharynx benign  Neck: Supple, no carotid bruits  Cardiac: Regular rate rhythm, no murmur  Musculoskeletal: No deformity   Neurological examination   Mentation: Alert  MMSE - Mini Mental State Exam 03/02/2018 08/03/2017 06/21/2017  Orientation to time 4 5 5   Orientation to Place 5 5 5   Registration 3 3 3   Attention/ Calculation 5 0 5  Recall 1 0 1  Recall-comments - unable to recall 3 of 3 words -  Language- name 2 objects 2 0 2  Language- repeat 1 1 0  Language- follow 3 step command 3 3 3   Language- read & follow direction 1 0 1  Write a sentence 1 0 1  Copy design 1 0 1  Total score 27 17 27    Follows all commands speech and language fluent.  Animal naming 10  Cranial nerve II-XII: Pupils were equal round reactive to light extraocular movements were full, visual field were full on confrontational test. Facial sensation and strength were normal. hearing was intact to finger rubbing bilaterally. Uvula tongue midline. head turning and shoulder shrug were normal and symmetric.Tongue protrusion into cheek strength was normal. Motor: normal bulk and tone, full strength in the BUE, BLE, fine finger movements normal, no pronator drift. No focal weakness Sensory: normal and symmetric to light touch, pinprick, and  Vibration, in the upper and lower extremities Coordination: finger-nose-finger, heel-to-shin bilaterally, no dysmetria Reflexes: Symmetric upper and lower plantar responses were flexor bilaterally. Gait and Station: Rising up from seated position without assistance,  tends to hold left arm to abdomen  DIAGNOSTIC DATA (LABS, IMAGING, TESTING) - I reviewed patient records, labs, notes, testing and imaging myself where available.    Lab Results  Component Value Date   TSH 0.80 02/14/2018      ASSESSMENT AND PLAN  67 y.o. year old male  Anoxic brain injury in 1999 Mild cognitive impairment  Refill Aricept 10 mg daily, Namenda 10 mg twice a day Seizure  Most recent seizure was on December 19 2017.  CT head without contrast showed no acute abnormality, mild generalized atrophy, ventriculomegaly,  EEG was normal  no driving until episode free for 6 months  He is tolerating lamotrigine 100 mg twice a day well.   Marcial Pacas, M.D. Ph.D.  [facility] [place], [place] [address] Phone: [phone] Fax:      [phone]

## 2018-06-08 ENCOUNTER — Other Ambulatory Visit: Payer: Self-pay | Admitting: *Deleted

## 2018-06-08 NOTE — Telephone Encounter (Signed)
Name of Medication: Adderall Name of Pharmacy: [facility] Last Fill or Written Date and Quantity:  180 tablet 0 03/26/2018  Last Office Visit and Type: 12/23/17 Syncope Next Office Visit and Type: 08/10/2018 AWV Last Controlled Substance Agreement Date: 11/23/12 Last UDS:04/18/15

## 2018-06-09 ENCOUNTER — Other Ambulatory Visit: Payer: Self-pay | Admitting: *Deleted

## 2018-06-09 MED ORDER — LAMOTRIGINE 100 MG PO TABS: 100.0000 mg | ORAL_TABLET | Freq: Two times a day (BID) | ORAL | 4 refills | Status: DC

## 2018-06-11 MED ORDER — AMPHETAMINE-DEXTROAMPHETAMINE 10 MG PO TABS: 10.0000 mg | ORAL_TABLET | Freq: Two times a day (BID) | ORAL | 0 refills | Status: DC

## 2018-06-11 NOTE — Telephone Encounter (Signed)
Sent. Thanks.   

## 2018-06-14 NOTE — Telephone Encounter (Signed)
Pt wanting status of refill on adderall. Advised pt rx sent electronically on 06/11/18 at 7:50 pm. Pt voiced understanding and was appreciative.

## 2018-06-21 DIAGNOSIS — H0288B Meibomian gland dysfunction left eye, upper and lower eyelids: Secondary | ICD-10-CM | POA: Diagnosis not present

## 2018-06-21 DIAGNOSIS — H524 Presbyopia: Secondary | ICD-10-CM | POA: Diagnosis not present

## 2018-06-21 DIAGNOSIS — H2513 Age-related nuclear cataract, bilateral: Secondary | ICD-10-CM | POA: Diagnosis not present

## 2018-06-21 DIAGNOSIS — H0288A Meibomian gland dysfunction right eye, upper and lower eyelids: Secondary | ICD-10-CM | POA: Diagnosis not present

## 2018-06-21 DIAGNOSIS — H5203 Hypermetropia, bilateral: Secondary | ICD-10-CM | POA: Diagnosis not present

## 2018-08-10 ENCOUNTER — Ambulatory Visit: Payer: PPO

## 2018-08-10 ENCOUNTER — Other Ambulatory Visit: Payer: Self-pay | Admitting: Family Medicine

## 2018-08-10 DIAGNOSIS — Z125 Encounter for screening for malignant neoplasm of prostate: Secondary | ICD-10-CM

## 2018-08-10 DIAGNOSIS — I10 Essential (primary) hypertension: Secondary | ICD-10-CM

## 2018-08-10 DIAGNOSIS — E039 Hypothyroidism, unspecified: Secondary | ICD-10-CM

## 2018-08-11 ENCOUNTER — Other Ambulatory Visit (INDEPENDENT_AMBULATORY_CARE_PROVIDER_SITE_OTHER): Payer: Medicare Other

## 2018-08-11 DIAGNOSIS — E039 Hypothyroidism, unspecified: Secondary | ICD-10-CM

## 2018-08-11 DIAGNOSIS — I10 Essential (primary) hypertension: Secondary | ICD-10-CM

## 2018-08-11 DIAGNOSIS — Z125 Encounter for screening for malignant neoplasm of prostate: Secondary | ICD-10-CM | POA: Diagnosis not present

## 2018-08-11 LAB — LIPID PANEL
Cholesterol: 188 mg/dL (ref 0–200)
HDL: 55.3 mg/dL (ref >39.00)
LDL Cholesterol: 107 mg/dL — ABNORMAL HIGH (ref 0–99)
NonHDL: 132.48
Total CHOL/HDL Ratio: 3
Triglycerides: 127 mg/dL (ref 0.0–149.0)
VLDL: 25.4 mg/dL (ref 0.0–40.0)

## 2018-08-11 LAB — PSA, MEDICARE: PSA: 0.23 ng/ml (ref 0.10–4.00)

## 2018-08-11 LAB — CBC WITH DIFFERENTIAL/PLATELET
Basophils Absolute: 0.1 10*3/uL (ref 0.0–0.1)
Basophils Relative: 1.1 % (ref 0.0–3.0)
Eosinophils Absolute: 0.2 10*3/uL (ref 0.0–0.7)
Eosinophils Relative: 2.7 % (ref 0.0–5.0)
HCT: 42.8 % (ref 39.0–52.0)
Hemoglobin: 14.6 g/dL (ref 13.0–17.0)
Lymphocytes Relative: 39.3 % (ref 12.0–46.0)
Lymphs Abs: 2.9 10*3/uL (ref 0.7–4.0)
MCHC: 34 g/dL (ref 30.0–36.0)
MCV: 92.3 fl (ref 78.0–100.0)
Monocytes Absolute: 0.7 10*3/uL (ref 0.1–1.0)
Monocytes Relative: 9.6 % (ref 3.0–12.0)
Neutro Abs: 3.5 10*3/uL (ref 1.4–7.7)
Neutrophils Relative %: 47.3 % (ref 43.0–77.0)
Platelets: 352 10*3/uL (ref 150.0–400.0)
RBC: 4.64 Mil/uL (ref 4.22–5.81)
RDW: 13.1 % (ref 11.5–15.5)
WBC: 7.4 10*3/uL (ref 4.0–10.5)

## 2018-08-11 LAB — COMPREHENSIVE METABOLIC PANEL
ALT: 37 U/L (ref 0–53)
AST: 24 U/L (ref 0–37)
Albumin: 4.6 g/dL (ref 3.5–5.2)
Alkaline Phosphatase: 73 U/L (ref 39–117)
BUN: 12 mg/dL (ref 6–23)
CO2: 30 mEq/L (ref 19–32)
Calcium: 9.7 mg/dL (ref 8.4–10.5)
Chloride: 103 mEq/L (ref 96–112)
Creatinine, Ser: 1.06 mg/dL (ref 0.40–1.50)
GFR: 69.88 mL/min (ref >60.00)
Glucose, Bld: 95 mg/dL (ref 70–99)
Potassium: 4.7 mEq/L (ref 3.5–5.1)
Sodium: 141 mEq/L (ref 135–145)
Total Bilirubin: 0.5 mg/dL (ref 0.2–1.2)
Total Protein: 6.7 g/dL (ref 6.0–8.3)

## 2018-08-11 LAB — TSH: TSH: 4.34 u[IU]/mL (ref 0.35–4.50)

## 2018-08-14 ENCOUNTER — Encounter: Payer: Self-pay | Admitting: Family Medicine

## 2018-08-14 ENCOUNTER — Ambulatory Visit (INDEPENDENT_AMBULATORY_CARE_PROVIDER_SITE_OTHER): Payer: Medicare Other | Admitting: Family Medicine

## 2018-08-14 VITALS — BP 140/82 | HR 75 | Temp 97.8°F | Ht 67.75 in | Wt 190.5 lb

## 2018-08-14 DIAGNOSIS — E7849 Other hyperlipidemia: Secondary | ICD-10-CM | POA: Diagnosis not present

## 2018-08-14 DIAGNOSIS — T7840XS Allergy, unspecified, sequela: Secondary | ICD-10-CM

## 2018-08-14 DIAGNOSIS — I10 Essential (primary) hypertension: Secondary | ICD-10-CM | POA: Diagnosis not present

## 2018-08-14 DIAGNOSIS — Z7189 Other specified counseling: Secondary | ICD-10-CM

## 2018-08-14 DIAGNOSIS — Z23 Encounter for immunization: Secondary | ICD-10-CM | POA: Diagnosis not present

## 2018-08-14 DIAGNOSIS — R569 Unspecified convulsions: Secondary | ICD-10-CM

## 2018-08-14 DIAGNOSIS — Z659 Problem related to unspecified psychosocial circumstances: Secondary | ICD-10-CM

## 2018-08-14 DIAGNOSIS — G4730 Sleep apnea, unspecified: Secondary | ICD-10-CM

## 2018-08-14 DIAGNOSIS — Z0000 Encounter for general adult medical examination without abnormal findings: Secondary | ICD-10-CM | POA: Diagnosis not present

## 2018-08-14 DIAGNOSIS — R413 Other amnesia: Secondary | ICD-10-CM

## 2018-08-14 DIAGNOSIS — E039 Hypothyroidism, unspecified: Secondary | ICD-10-CM | POA: Diagnosis not present

## 2018-08-14 MED ORDER — MONTELUKAST SODIUM 10 MG PO TABS: 10.0000 mg | ORAL_TABLET | Freq: Every day | ORAL | 3 refills | Status: DC

## 2018-08-14 MED ORDER — METOPROLOL TARTRATE 25 MG PO TABS: 12.5000 mg | ORAL_TABLET | Freq: Two times a day (BID) | ORAL | Status: DC

## 2018-08-14 MED ORDER — ROSUVASTATIN CALCIUM 20 MG PO TABS: 20.0000 mg | ORAL_TABLET | Freq: Every day | ORAL | 3 refills | Status: DC

## 2018-08-14 MED ORDER — LEVOTHYROXINE SODIUM 100 MCG PO TABS: 100.0000 ug | ORAL_TABLET | Freq: Every day | ORAL | 3 refills | Status: DC

## 2018-08-14 NOTE — Patient Instructions (Addendum)
Check with your insurance to see if they will cover the shingrix shot.  See if the pharmacy has it.    Tetanus shot may be cheaper at the pharmacy (Tdap).    PNA shot today.    Change in thyroid pills.  No change in total weekly dose, but now only 1 pill a day.   Take care.  Glad to see you.  Update me as needed.

## 2018-08-14 NOTE — Progress Notes (Signed)
I have personally reviewed the Medicare Annual Wellness questionnaire and have noted 1. The patient's medical and social history 2. Their use of alcohol, tobacco or illicit drugs 3. Their current medications and supplements 4. The patient's functional ability including ADL's, fall risks, home safety risks and hearing or visual             impairment. 5. Diet and physical activities 6. Evidence for depression or mood disorders  The patients weight, height, BMI have been recorded in the chart and visual acuity is per eye clinic.  I have made referrals, counseling and provided education to the patient based review of the above and I have provided the pt with a written personalized care plan for preventive services.  Provider list updated- see scanned forms.  Routine anticipatory guidance given to patient.  See health maintenance. The possibility exists that previously documented standard health maintenance information may have been brought forward from a previous encounter into this note.  If needed, that same information has been updated to reflect the current situation based on today's encounter.    Flu 2019 Shingles d/w pt PNA 2020 Tetanus 2010 Colonoscopy 2017 Prostate cancer screening 2020 Advance directive- wife designated if patient were incapacitated.  Cognitive function addressed- see scanned forms- and if abnormal then additional documentation follows.  HIV and HCV neg prev, d/w pt.   He couldn't tolerate CPAP but is using breath right strips. He snores less with those.   He has derm f/u yearly.  I'll defer, he agrees.  Dr. Juel Burrow clinic.   Elevated Cholesterol: Using medications without problems:yes Muscle aches: no Diet compliance:yes Exercise:yes He joined the [employer], d/w pt.    SZ hx per neuro.  He is back to driving.  No ADE on med.  No recent events.  Complaint.    Memory loss. Still on baseline meds for memory and concentration w/o ADE.  He doesn't get lost.  No red  flag events.  He has occ lapses around the house, trouble with names of people.  He still does well with Jeopardy questions.   Hypothyroidism.  D/w pt about simplifying his dosing.  No ADE on med.  No neck mass, no dysphagia.  See orders.    H/o ICD and HTN.  Using medication without problems or lightheadedness: yes Chest pain with exertion:no Edema:no Short of breath:no  Discussed death of his father.  He is trying to adjust to the change in his life.  The land was bought out and the house was torn down, the house he grew up in.  "It is one of those things you go through."  He is going to update me as needed.    Allergy sx controlled with current meds.   PMH and SH reviewed  Meds, vitals, and allergies reviewed.   ROS: Per HPI.  Unless specifically indicated otherwise in HPI, the patient denies:  General: fever. Eyes: acute vision changes ENT: sore throat Cardiovascular: chest pain Respiratory: SOB GI: vomiting GU: dysuria Musculoskeletal: acute back pain Derm: acute rash Neuro: acute motor dysfunction Psych: worsening mood Endocrine: polydipsia Heme: bleeding Allergy: hayfever  GEN: nad, alert and oriented HEENT: mucous membranes moist NECK: supple w/o LA CV: rrr. PULM: ctab, no inc wob ABD: soft, +bs EXT: no edema SKIN: well perfused.

## 2018-08-17 NOTE — Assessment & Plan Note (Signed)
Flu 2019 Shingles d/w pt PNA 2020 Tetanus 2010 Colonoscopy 2017 Prostate cancer screening 2020 Advance directive- wife designated if patient were incapacitated.  Cognitive function addressed- see scanned forms- and if abnormal then additional documentation follows.  HIV and HCV neg prev, d/w pt.

## 2018-08-17 NOTE — Assessment & Plan Note (Signed)
Seasonal symptoms controlled with current meds.  Continue as is.

## 2018-08-17 NOTE — Assessment & Plan Note (Signed)
Advance directive- wife designated if patient were incapacitated.  

## 2018-08-17 NOTE — Assessment & Plan Note (Signed)
He is trying to adjust to the death of his father.  Discussed.  Support offered.  He will update me as needed.

## 2018-08-17 NOTE — Assessment & Plan Note (Signed)
Continue work on diet and exercise.  Continue statin.  Labs discussed with patient.  He agrees. 

## 2018-08-17 NOTE — Assessment & Plan Note (Signed)
He couldn't tolerate CPAP but is using breath right strips. He snores less with those.

## 2018-08-17 NOTE — Assessment & Plan Note (Signed)
No change in meds.  Continue work on diet and exercise.  He agrees. 

## 2018-08-17 NOTE — Assessment & Plan Note (Signed)
  SZ hx per neuro.  He is back to driving.  No ADE on med.  No recent events.  Complaint.

## 2018-08-17 NOTE — Assessment & Plan Note (Signed)
Simplify dosing.  See orders.  Change to 100 mcg/day.  This is not a significant change in his total weekly dose.  Discussed with patient.  He agrees.

## 2018-08-17 NOTE — Assessment & Plan Note (Signed)
No red flag events.  Continue as is.  He is managing.

## 2018-09-04 ENCOUNTER — Encounter: Payer: Self-pay | Admitting: Family Medicine

## 2018-09-04 ENCOUNTER — Telehealth: Payer: Self-pay

## 2018-09-04 ENCOUNTER — Ambulatory Visit (INDEPENDENT_AMBULATORY_CARE_PROVIDER_SITE_OTHER): Payer: Medicare Other | Admitting: Family Medicine

## 2018-09-04 DIAGNOSIS — B349 Viral infection, unspecified: Secondary | ICD-10-CM

## 2018-09-04 MED ORDER — HYDROCODONE-HOMATROPINE 5-1.5 MG/5ML PO SYRP: 5.0000 mL | ORAL_SOLUTION | Freq: Three times a day (TID) | ORAL | 0 refills | Status: DC | PRN

## 2018-09-04 MED ORDER — DOXYCYCLINE HYCLATE 100 MG PO TABS: 100.0000 mg | ORAL_TABLET | Freq: Two times a day (BID) | ORAL | 0 refills | Status: DC

## 2018-09-04 MED ORDER — BENZONATATE 200 MG PO CAPS: 200.0000 mg | ORAL_CAPSULE | Freq: Three times a day (TID) | ORAL | 1 refills | Status: DC | PRN

## 2018-09-04 NOTE — Telephone Encounter (Signed)
Pt already has appt to see Dr Damita Dunnings 09/04/18 at 2 PM.

## 2018-09-04 NOTE — Patient Instructions (Signed)
Use tessalon for the cough initially (pills). Then use hydocan if needed (syrup- sedation caution).   If you get progressively worse, then start doxycyline.  Hold that for now.   Rest and fluids.  Update me as needed.   Your pulse should gradually normalize.  Update [place] if you aren't getting better.    The rash should fade in.   Take care.  Glad to see you.

## 2018-09-04 NOTE — Telephone Encounter (Signed)
Will see at OV.  Thanks.  

## 2018-09-04 NOTE — Progress Notes (Signed)
He usually has a diffuse rash that doesn't itch when he gets a cold/infection.   He last felt well 6 days ago.  Sx for the last 5 days. First noted hoarse voice, lower pitch voice.  Then cough, chills, fever.  Post nasal gtt.  Cough with discolored sputum.  Rash came up yesterday.  Taking OTC cold meds.    No vomiting, no diarrhea.  Can still get a deep breath.  Wife was sick with post nasal gtt but she didn't have fever.    Meds, vitals, and allergies reviewed.   ROS: Per HPI unless specifically indicated in ROS section   GEN: nad, alert and oriented HEENT: mucous membranes moist, tm w/o erythema, nasal exam w/o erythema, clear discharge noted,  OP with cobblestoning, sinuses not ttp.   NECK: supple w/o LA CV: Regular but tachy.    PULM: ctab, no inc wob EXT: no edema SKIN:  Blanching rash on the B medial arms, not on the palms or axilla.  On the neck and back.  Symmetric rash.

## 2018-09-04 NOTE — Telephone Encounter (Signed)
[facility] Night - Client Nonclinical Telephone Record [facility] Night - Client Client Site [facility] Physician Renford Dills - MD Contact Type Call Who Is Calling Patient / Member / Family / Caregiver Caller Name [place] Phone Number [phone] Patient Name Larry [name] Patient DOB 13-Jan-1951 Call Type Message Only Information Provided Reason for Call Request to Schedule Office Appointment Initial Comment Caller states she is needing to schedule an appt for her husband today if possible, and asks for a call back. Refused triage. Additional Comment Provided information for a call back from the office, connected caller for further assistance. Call Closed By: Rosana Fret Transaction Date/Time: 09/04/2018 8:08:08 AM (ET)

## 2018-09-06 DIAGNOSIS — B349 Viral infection, unspecified: Secondary | ICD-10-CM | POA: Insufficient documentation

## 2018-09-06 NOTE — Assessment & Plan Note (Signed)
Likely a benign viral issue that should improve with supportive care.  He tends to have this type of rash erupt when he has a cold or URI.  Still okay for outpatient follow-up.  Discussed options.  Use tessalon for the cough initially. Then use hydocan if needed (syrup- sedation caution).   If cough is progressively worse, then start doxycyline.  Hold that for now.   Rest and fluids.  Update me as needed.   His pulse should gradually normalize.  He can update [place] as needed.  He can check his pulse at home.  The rash should fade in.  He agrees with plan.

## 2018-09-15 ENCOUNTER — Other Ambulatory Visit: Payer: Self-pay | Admitting: Cardiovascular Disease

## 2018-09-15 ENCOUNTER — Other Ambulatory Visit: Payer: Self-pay

## 2018-09-15 NOTE — Telephone Encounter (Signed)
Mrs Barocio(DPR signed) said pt heard that needed to keep at least 1 month of medication on hand during this time of Covid. [facility] and [facility] will [facility] to pt when receives refill from Dr Damita Dunnings.  Name of Medication: amphetamine dextroamphetamine 10 mg [facility] Last Fill or Written Date and Quantity: # 180 on 06/11/18 Last Office Visit and Type: 08/14/18 annual Next Office Visit and Type:none scheduled.

## 2018-09-17 MED ORDER — AMPHETAMINE-DEXTROAMPHETAMINE 10 MG PO TABS: 10.0000 mg | ORAL_TABLET | Freq: Two times a day (BID) | ORAL | 0 refills | Status: DC

## 2018-09-17 NOTE — Telephone Encounter (Signed)
Sent. Thanks.   

## 2018-11-23 ENCOUNTER — Other Ambulatory Visit: Payer: Self-pay | Admitting: Cardiovascular Disease

## 2018-11-30 ENCOUNTER — Telehealth: Payer: Self-pay | Admitting: *Deleted

## 2018-11-30 NOTE — Telephone Encounter (Signed)
Due to current COVID 19 pandemic, our office is severely reducing in office visits until further notice, in order to minimize the risk to our patients and healthcare providers.   Pt understands that although there may be some limitations with this type of visit, we will take all precautions to reduce any security or privacy concerns.  Pt understands that this will be treated like an in office visit and we will file with pt's insurance, and there may be a patient responsible charge related to this service.  Pt consented to [email or web address].  email sent to [email or web address].

## 2018-12-04 NOTE — Progress Notes (Signed)
Virtual Visit via Video Note  I connected with Larry [name] on 12/04/18 at  9:45 AM EDT by a video enabled telemedicine application and verified that I am speaking with the correct person using two identifiers.  Location: Patient: At his home  Provider: At office    I discussed the limitations of evaluation and management by telemedicine and the availability of in person appointments. The patient expressed understanding and agreed to proceed.  History of Present Illness: HISTORY OF PRESENT ILLNESS:[name] is a very friendly 68 year old right-handed gentleman presents for followup consultation of his memory loss in the context of anoxic brain injury.   He is accompanied by his wife today. 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  EEG showed diffuse slowing in January 1999, CT head and brain MRI were normal. CT head repeat in November 2000 showed slight prominence of the lateral and third ventricles. CT head in July 2003 showed prominence of the ventricular system. Psychological testing and August 1999 showed borderline performance IQ. He became depressed. In January 2014 his MMSE was 29, clock drawing was 4, animal fluency was 10. He went to [facility] for 2 years for patient with degenerative disorder.  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 He lives at home with his wife, still driving. He helps the household, he still has difficulty focusing.   He has had some fluctuation in his memory per wife. He has ADD and continues to take Adderall, 10 mg  twice a day, without Adderall, he could not remember the pages that he has read,he is also taking Aricept 10 mg every day, which has been very helpful too.  Up date April 14 2016YY  His overall doing very well, he likes play computer games, which has helped his eye and hands coordination, he had recent trip to [place], felt overwhelming, he continued to drive short distance,  He is taking Ritalin 10 mg twice a day, Aricept 10 mg once a day, complains of insomnia, he took his last dose of Ritalin at evening time.  UPDATE Apr 21 2015:YY He is now taking Namenda 10mg  bid and aricept 10mg  qday, he still has trouble sleeping, He watch movie, stay up late, he continues to have focus difficulty, tends to become agitated.  UPDATE April 27th 2017: YY He still has chronic insomnia, tends to have irregular sleep pattern, stay up late watching TV, he still very active during the day, take Adderall 10 mg twice a day, still drives short distance, mild unsteady gait, tends to hold his left arm to his chest  UPDATE Oct 17th 2017:YY He is with his wife, Her daughter was hospital, presented with sudden loss of consciousness yesterday April 19 2016, which has brought back a lot of his memory, he has not tried clonazepamfor chronic insomnia, worry about side effect, tried melatonin, still has significant insomnia, he is taking Tylenol pm,   UPDATE March 02 2018: He was follow-up by Larry [name] in 2018, on December 19, 2017, visiting his daughter at [place], in the morning time, he had sudden onset dizziness, feels  like going to pass out, then had transient loss of consciousness, eyes rolled back, staring, lasting for 1 minutes, followed by postevent mild confusion and slurred speech last about 40 minutes, was treated at local hospital.  I personally reviewed CT head without contrast: There was no acute abnormality, generalized atrophy, ventriculomegaly,  laboratory evaluation, EKG showed no significant  abnormality.  Similar spells in March 2019, transient dizziness followed by sudden loss of muscle tone, consciousness, extreme fatigue, slept for 1 hour afterwards,  Patient reported that he actually has frequent spells about once a month, he has transient confusion, feels like he is going to pass out, short lasting,  UPDATE Jun 05 2018: He tolerated lamotrigine very well, taking 100 mg twice a day without significant side effect,  EEG was normal.  Since lamotrigine, he no longer has recurrent spells of rising sensation transient confusion even though there was no clinical seizure activity observed during those spells, he had those spells about once a week prior to lamotrigine,  Update 12/05/2018 SS: 68 year old male with history of anoxic brain injury in 1999, mild cognitive impairment on Aricept and Namenda, and seizure, most recent seizure was in June 2019.  No change in memory, continued trouble with short-term memory, forgets what he goes into the room to get.  No recurrent seizure, in February 2020, did get a different manufacturer of his lamotrigine, had an episode where he felt like he was going to pass out, but he did not.  He called the pharmacy, got the medication switched back to his regular manufacturer.  Has not had any problems since then.  He continues taking lamotrigine 100 mg twice daily, is tolerating medication well.  He is driving a car, without difficulty, his wife is in the car when he drives.  No episodes of getting lost.  His wife manages their finances.  He has a small garden, he does yard work, housework, works on Cytogeneticist.  Has been doing very well. He manages his own medications.  Observations/Objective: Alert and oriented, answers questions appropriately, speech is clear and concise, facial symmetry noted, gait is intact, Walking looking down at feet,   [place] blind 18/22 (able to name 16 words that start with letter F)  Assessment and Plan: 1.  Anoxic brain  injury in 1999, mild cognitive impairment -Memory score is stable, no real change according to patient -He will continue Aricept 10 mg daily, Namenda 10 mg twice daily (I have sent refills) -Continue consistent activity (has been walking with his wife), healthy eating, brain stimulating exercises (computer work)  2.  Seizure -Most recent seizure was in June 2019, EEG was normal, he will continue taking lamotrigine 100 mg twice daily -I will check blood work, he will come to the office in the next 1 to 2 weeks to have this done -He had an episode where his pharmacy changed the manufacturer of his lamotrigine, this has been resolved, with the episode he felt he was going to pass out, but not-no recent seizure  Follow Up Instructions: 6 months, I have made him appointment December 8 at 945  I discussed the assessment and treatment plan with the patient. The patient was provided an opportunity to ask questions and all were answered. The patient agreed with the plan and demonstrated an understanding of the instructions.   The patient was advised to call back or seek an in-person evaluation if the symptoms worsen or if the condition fails to improve as anticipated.  I provided 25 minutes  of non-face-to-face time during this encounter.  Evangeline Dakin, DNP  [facility] [address], [place], [place] [address] [phone]

## 2018-12-05 ENCOUNTER — Other Ambulatory Visit: Payer: Self-pay

## 2018-12-05 ENCOUNTER — Encounter: Payer: Self-pay | Admitting: Neurology

## 2018-12-05 ENCOUNTER — Ambulatory Visit (INDEPENDENT_AMBULATORY_CARE_PROVIDER_SITE_OTHER): Payer: Medicare Other | Admitting: Neurology

## 2018-12-05 DIAGNOSIS — R413 Other amnesia: Secondary | ICD-10-CM | POA: Diagnosis not present

## 2018-12-05 DIAGNOSIS — R569 Unspecified convulsions: Secondary | ICD-10-CM | POA: Diagnosis not present

## 2018-12-05 MED ORDER — MEMANTINE HCL 10 MG PO TABS: 10.0000 mg | ORAL_TABLET | Freq: Two times a day (BID) | ORAL | 4 refills | Status: DC

## 2018-12-05 MED ORDER — DONEPEZIL HCL 10 MG PO TABS: 10.0000 mg | ORAL_TABLET | Freq: Every day | ORAL | 4 refills | Status: DC

## 2018-12-05 NOTE — Progress Notes (Signed)
I have reviewed and agreed above plan. 

## 2018-12-14 ENCOUNTER — Other Ambulatory Visit: Payer: Self-pay | Admitting: Cardiovascular Disease

## 2019-01-03 ENCOUNTER — Telehealth: Payer: Self-pay

## 2019-01-03 NOTE — Telephone Encounter (Signed)
LMVM reference need to change appointment time 01/09/19 from 9:45 to 8 am same day.

## 2019-01-04 ENCOUNTER — Telehealth: Payer: Self-pay

## 2019-01-04 NOTE — Telephone Encounter (Signed)

## 2019-01-08 ENCOUNTER — Other Ambulatory Visit: Payer: Self-pay | Admitting: Family Medicine

## 2019-01-08 NOTE — Telephone Encounter (Signed)
Name of Medication: Adderall 10 mg [facility] Last Fill or Written Date and Quantity: # 180 on 09/17/18 Last Office Visit and Type:08/14/18 for annual exam  Next Office Visit and Type:none scheduled   Pt request rx for 3 months and apt has one wk of adderall left.

## 2019-01-08 NOTE — Telephone Encounter (Signed)
Patient is requesting a refill  ADDERALL   Patient stated he normally gets 3 months of medication   1 week left of medication   [facility]    Patient's C/B # [phone]

## 2019-01-09 ENCOUNTER — Ambulatory Visit (INDEPENDENT_AMBULATORY_CARE_PROVIDER_SITE_OTHER): Payer: Medicare Other | Admitting: Internal Medicine

## 2019-01-09 ENCOUNTER — Other Ambulatory Visit: Payer: Self-pay

## 2019-01-09 ENCOUNTER — Encounter: Payer: Self-pay | Admitting: Internal Medicine

## 2019-01-09 VITALS — BP 138/80 | HR 69 | Ht 69.0 in | Wt 187.0 lb

## 2019-01-09 DIAGNOSIS — I472 Ventricular tachycardia: Secondary | ICD-10-CM

## 2019-01-09 DIAGNOSIS — I10 Essential (primary) hypertension: Secondary | ICD-10-CM | POA: Diagnosis not present

## 2019-01-09 DIAGNOSIS — Z8674 Personal history of sudden cardiac arrest: Secondary | ICD-10-CM | POA: Insufficient documentation

## 2019-01-09 DIAGNOSIS — I4729 Other ventricular tachycardia: Secondary | ICD-10-CM

## 2019-01-09 DIAGNOSIS — Z95810 Presence of automatic (implantable) cardiac defibrillator: Secondary | ICD-10-CM | POA: Diagnosis not present

## 2019-01-09 MED ORDER — AMPHETAMINE-DEXTROAMPHETAMINE 10 MG PO TABS: 10.0000 mg | ORAL_TABLET | Freq: Two times a day (BID) | ORAL | 0 refills | Status: DC

## 2019-01-09 NOTE — Telephone Encounter (Signed)
Eprescribed.

## 2019-01-09 NOTE — Addendum Note (Signed)
Addended by: Alba Destine on: 01/09/2019 10:14 AM   Modules accepted: Orders

## 2019-01-09 NOTE — Patient Instructions (Signed)
Medication Instructions:  - Your physician recommends that you continue on your current medications as directed. Please refer to the Current Medication list given to you today.  If you need a refill on your cardiac medications before your next appointment, please call your pharmacy.   Lab work: - none ordered  If you have labs (blood work) drawn today and your tests are completely normal, you will receive your results only by: Marland Kitchen MyChart Message (if you have MyChart) OR . A paper copy in the mail If you have any lab test that is abnormal or we need to change your treatment, we will call you to review the results.  Testing/Procedures: - none ordered  Follow-Up: At [facility], you and your health needs are our priority.  As part of our continuing mission to provide you with exceptional heart care, we have created designated Provider Care Teams.  These Care Teams include your primary Cardiologist (physician) and Advanced Practice Providers (APPs -  Physician Assistants and Nurse Practitioners) who all work together to provide you with the care you need, when you need it. You will need a follow up appointment in 1 year with Dr. Caryl Comes (July 2021).  . Please call our office 2 months in advance to schedule this appointment.  (call in early May 2021 to schedule)  Any Other Special Instructions Will Be Listed Below (If Applicable). - N/A

## 2019-01-09 NOTE — Progress Notes (Signed)
Patient Care Team: [name], MD as PCP - General (Family Medicine) Sherren Mocha, MD as PCP - Cardiology (Cardiology)   HPI  Larry [name] is a 68 y.o. male seen in followup for aborted cardiac arrest 1999 that occurred in the context of PVCs for which he was being treated with Propafenone. He underwent ICD generator replacement in Dec 2011 .  The patient denies chest pain, shortness of breath, nocturnal dyspnea, orthopnea or peripheral edema.  There have been no palpitations, lightheadedness or syncope.    No issues with medications    \  Past Medical History:  Diagnosis Date  . ADD (attention deficit disorder)   . Allergy    seasonal flares of perinneal allergies  . Cancer ([place])    melanoma  . Cardiac arrest ([place]) 07/1997   aborted  . Dementia ([place])    post resusitative  . Depression   . Diverticulosis   . Family hx of prostate cancer   . GERD (gastroesophageal reflux disease)   . Headache(784.0)   . Hyperlipidemia   . Hypothyroidism   . ICD (implantable cardiac defibrillator) in place   . Idiopathic urticaria   . Internal hemorrhoids   . Low back pain syndrome   . Melanoma in situ of skin of trunk (HCC)    chest  . PVC (premature ventricular contraction)    associated with cardiac arrest  . Sleep apnea    no c-pap  . Tubular adenoma of colon 10/2010    Past Surgical History:  Procedure Laterality Date  .  implantation x2    . CARDIAC DEFIBRILLATOR PLACEMENT  07/01/2010   Explanation of a previously implanted device, pocket revision, and insertion of a new device, and intraoperative defibrillation threshold testing Caryl Comes)  . CARDIAC DEFIBRILLATOR REMOVAL  2004   Defibrillator change Caryl Comes) 3 times changed  . HEMORRHOID SURGERY    . ICD    . TONSILLECTOMY      Current Outpatient Medications  Medication Sig Dispense Refill  . amphetamine-dextroamphetamine (ADDERALL) 10 MG tablet Take 1 tablet (10 mg total) by mouth 2 (two) times daily. 180  tablet 0  . Ascorbic Acid (VITAMIN C) 500 MG tablet Take 500 mg by mouth daily.      Marland Kitchen aspirin 81 MG tablet Take 81 mg by mouth daily.    . benzonatate (TESSALON) 200 MG capsule Take 1 capsule (200 mg total) by mouth 3 (three) times daily as needed. 30 capsule 1  . diltiazem (CARDIZEM CD) 120 MG 24 hr capsule TAKE 1 CAPSULE DAILY (KEEP UPCOMING APPOINTMENT) 90 capsule 0  . donepezil (ARICEPT) 10 MG tablet Take 1 tablet (10 mg total) by mouth daily. 90 tablet 4  . fexofenadine (ALLEGRA) 180 MG tablet Take 180 mg by mouth daily.      . Ginkgo Biloba 40 MG TABS Take by mouth as directed.    . lamoTRIgine (LAMICTAL) 100 MG tablet Take 1 tablet (100 mg total) by mouth 2 (two) times daily. 180 tablet 4  . levothyroxine (SYNTHROID, LEVOTHROID) 100 MCG tablet Take 1 tablet (100 mcg total) by mouth daily before breakfast. 90 tablet 3  . Melatonin 5 MG CAPS Take by mouth as directed.    . memantine (NAMENDA) 10 MG tablet Take 1 tablet (10 mg total) by mouth 2 (two) times daily. 180 tablet 4  . metoprolol tartrate (LOPRESSOR) 25 MG tablet TAKE 1 TABLET TWICE A DAY (Patient taking differently: 12.5 mg 2 (two) times daily. ) 180 tablet 0  .  montelukast (SINGULAIR) 10 MG tablet Take 1 tablet (10 mg total) by mouth daily. 90 tablet 3  . Multiple Vitamin (MULTIVITAMIN) tablet Take 1 tablet by mouth daily.      . NON FORMULARY Focus factor 1 tab po bid     . rosuvastatin (CRESTOR) 20 MG tablet Take 1 tablet (20 mg total) by mouth at bedtime. 90 tablet 3  . vitamin E 400 UNIT capsule Take 400 Units by mouth daily.       No current facility-administered medications for this visit.     Allergies  Allergen Reactions  . Penicillins     Difficult Breathing, rash    Review of Systems negative except from HPI and PMH  Physical Exam BP 138/80 (BP Location: Left Arm, Patient Position: Sitting, Cuff Size: Normal)   Pulse 69   Ht 5\' 9"  (1.753 m)   Wt 187 lb (84.8 kg)   BMI 27.62 kg/m  Well developed and  nourished in no acute distress HENT normal Neck supple with JVP-  flat *  Clear Regular rate and rhythm, no murmurs or gallops Abd-soft with active BS No Clubbing cyanosis edema Skin-warm and dry A & Oriented  Grossly normal sensory and motor function  ECG sinus @ 69 14/10/40  Device interrogation also in paceART Normal function some Vp at LRL VT NS   Assessment and  Plan Aborted Cardiac Arrest  Hypertension  Well controlled  On recheck Sys <125  Implantable Defibrillator  The patient's device was interrogated.  The information was reviewed. No changes were made in the programming.

## 2019-02-21 ENCOUNTER — Other Ambulatory Visit: Payer: Self-pay | Admitting: Cardiovascular Disease

## 2019-03-02 ENCOUNTER — Other Ambulatory Visit: Payer: Self-pay | Admitting: Cardiovascular Disease

## 2019-03-02 ENCOUNTER — Other Ambulatory Visit: Payer: Self-pay | Admitting: *Deleted

## 2019-03-02 MED ORDER — DILTIAZEM HCL ER COATED BEADS 120 MG PO CP24: ORAL_CAPSULE | ORAL | 0 refills | Status: DC

## 2019-03-02 MED ORDER — LEVOTHYROXINE SODIUM 100 MCG PO TABS: 100.0000 ug | ORAL_TABLET | Freq: Every day | ORAL | 3 refills | Status: DC

## 2019-03-02 MED ORDER — METOPROLOL TARTRATE 25 MG PO TABS: 25.0000 mg | ORAL_TABLET | Freq: Two times a day (BID) | ORAL | 0 refills | Status: DC

## 2019-03-26 ENCOUNTER — Ambulatory Visit: Payer: Medicare Other | Admitting: Cardiology

## 2019-03-26 ENCOUNTER — Encounter: Payer: Self-pay | Admitting: Cardiology

## 2019-03-26 ENCOUNTER — Other Ambulatory Visit: Payer: Self-pay

## 2019-03-26 VITALS — BP 130/70 | HR 90 | Ht 69.0 in | Wt 190.4 lb

## 2019-03-26 DIAGNOSIS — E7849 Other hyperlipidemia: Secondary | ICD-10-CM | POA: Diagnosis not present

## 2019-03-26 DIAGNOSIS — Z8674 Personal history of sudden cardiac arrest: Secondary | ICD-10-CM | POA: Diagnosis not present

## 2019-03-26 DIAGNOSIS — I10 Essential (primary) hypertension: Secondary | ICD-10-CM

## 2019-03-26 DIAGNOSIS — Z95810 Presence of automatic (implantable) cardiac defibrillator: Secondary | ICD-10-CM

## 2019-03-26 MED ORDER — ROSUVASTATIN CALCIUM 20 MG PO TABS: 20.0000 mg | ORAL_TABLET | Freq: Every day | ORAL | 3 refills | Status: DC

## 2019-03-26 MED ORDER — METOPROLOL SUCCINATE ER 25 MG PO TB24: 25.0000 mg | ORAL_TABLET | Freq: Every day | ORAL | 3 refills | Status: DC

## 2019-03-26 MED ORDER — DILTIAZEM HCL ER COATED BEADS 120 MG PO CP24: ORAL_CAPSULE | ORAL | 3 refills | Status: DC

## 2019-03-26 NOTE — Progress Notes (Signed)
Cardiology Office Note:    Date:  03/26/2019   ID:  Larry [name], DOB 1950/07/23, MRN [id number]  PCP:  [name], MD  Cardiologist:  Sherren Mocha, MD  Electrophysiologist: Virl Axe, MD  Referring MD: [name], MD   Chief Complaint  Patient presents with  . Follow-up    History of Present Illness:    Larry [name] is a 68 y.o. male with a past medical history significant for cardiac arrest in 1999 with hypoxic brain injury- now improved. He underwent ICD implantation. He was found to have normal coronary arteries at the time of his initial event.  He has had normal LV function by echo assessment.  He had syncope in 2019 but he was put on lamotrigine for possible seizures by his neurologist and he has had no further syncope.   He used to walk but has not been walking since the hot weather. He does yardwork. He denies exertional chest or SOB, No Lightheadedness, palpitations, orthpnea, PND or edema.   His wife joined the visit by facetime. She is concerned about some short term memory issues. He is already on some memory medications and I advised them to discuss issues with the neurologist. Otherwise she says he is doing very well on his current heart medications. The patient notes that the metoprolol 25 mg pills are too hard to cut in half. I will switch to toprol XL so they don't have to cut in half.    The device clinic came into the room to set pt up for home monitoring of his device. Monitor will be delivered to his house by a company representative.   Past Medical History:  Diagnosis Date  . ADD (attention deficit disorder)   . Allergy    seasonal flares of perinneal allergies  . Cancer ([place])    melanoma  . Cardiac arrest ([place]) 07/1997   aborted  . Dementia ([place])    post resusitative  . Depression   . Diverticulosis   . Family hx of prostate cancer   . GERD (gastroesophageal reflux disease)   . Headache(784.0)   . Hyperlipidemia   .  Hypothyroidism   . ICD (implantable cardiac defibrillator) in place   . Idiopathic urticaria   . Internal hemorrhoids   . Low back pain syndrome   . Melanoma in situ of skin of trunk ([facility])    chest  . PVC (premature ventricular contraction)    associated with cardiac arrest  . Sleep apnea    no c-pap  . Tubular adenoma of colon 10/2010    Past Surgical History:  Procedure Laterality Date  .  implantation x2    . CARDIAC DEFIBRILLATOR PLACEMENT  07/01/2010   Explanation of a previously implanted device, pocket revision, and insertion of a new device, and intraoperative defibrillation threshold testing Caryl Comes)  . CARDIAC DEFIBRILLATOR REMOVAL  2004   Defibrillator change Caryl Comes) 3 times changed  . HEMORRHOID SURGERY    . ICD    . TONSILLECTOMY      Current Medications: Current Meds  Medication Sig  . amphetamine-dextroamphetamine (ADDERALL) 10 MG tablet Take 1 tablet (10 mg total) by mouth 2 (two) times daily.  . Ascorbic Acid (VITAMIN C) 500 MG tablet Take 500 mg by mouth daily.    Marland Kitchen aspirin 81 MG tablet Take 81 mg by mouth daily.  Marland Kitchen diltiazem (CARDIZEM CD) 120 MG 24 hr capsule TAKE 1 CAPSULE DAILY.  Marland Kitchen donepezil (ARICEPT) 10 MG tablet Take 1  tablet (10 mg total) by mouth daily.  . fexofenadine (ALLEGRA) 180 MG tablet Take 180 mg by mouth daily.    . Ginkgo Biloba 40 MG TABS Take by mouth as directed.  . lamoTRIgine (LAMICTAL) 100 MG tablet Take 1 tablet (100 mg total) by mouth 2 (two) times daily.  Marland Kitchen levothyroxine (SYNTHROID) 100 MCG tablet Take 1 tablet (100 mcg total) by mouth daily before breakfast.  . Melatonin 5 MG CAPS Take by mouth as directed.  . memantine (NAMENDA) 10 MG tablet Take 1 tablet (10 mg total) by mouth 2 (two) times daily.  . montelukast (SINGULAIR) 10 MG tablet Take 1 tablet (10 mg total) by mouth daily.  . Multiple Vitamin (MULTIVITAMIN) tablet Take 1 tablet by mouth daily.    . NON FORMULARY Focus factor 1 tab po bid   . rosuvastatin (CRESTOR) 20 MG  tablet Take 1 tablet (20 mg total) by mouth at bedtime.  . [DISCONTINUED] diltiazem (CARDIZEM CD) 120 MG 24 hr capsule TAKE 1 CAPSULE DAILY. Please make overdue appt with Dr. Burt Knack before anymore refills. 2nd attempt  . [DISCONTINUED] metoprolol tartrate (LOPRESSOR) 25 MG tablet Take 1 tablet (25 mg total) by mouth 2 (two) times daily. Please make overdue appt with Dr. Burt Knack before anymore refills. 2nd attempt  . [DISCONTINUED] rosuvastatin (CRESTOR) 20 MG tablet Take 1 tablet (20 mg total) by mouth at bedtime.     Allergies:   Penicillins   Social History   Socioeconomic History  . Marital status: Married    Spouse name: [name]  . Number of children: 2  . Years of education: College  . Highest education level: Not on file  Occupational History    Employer: DISABLED  Social Needs  . Financial resource strain: Not on file  . Food insecurity    Worry: Not on file    Inability: Not on file  . Transportation needs    Medical: Not on file    Non-medical: Not on file  Tobacco Use  . Smoking status: Former Smoker    Packs/day: 1.00    Years: 10.00    Pack years: 10.00    Types: Cigarettes    Quit date: 07/06/1995    Years since quitting: 23.7  . Smokeless tobacco: Former Systems developer    Types: Page date: 11/04/1994  Substance and Sexual Activity  . Alcohol use: No    Alcohol/week: 0.0 standard drinks    Comment: socially  . Drug use: No  . Sexual activity: Not on file  Lifestyle  . Physical activity    Days per week: Not on file    Minutes per session: Not on file  . Stress: Not on file  Relationships  . Social Herbalist on phone: Not on file    Gets together: Not on file    Attends religious service: Not on file    Active member of club or organization: Not on file    Attends meetings of clubs or organizations: Not on file    Relationship status: Not on file  Other Topics Concern  . Not on file  Social History Narrative   Patient is married [name]) [phone]  with 2 daughters   5 grandchildren   Daily caffeine use - one cup of coffee and one-two cokes per day   Patient is right-handed.   Patient has a college education     Family History: The patient's family history includes Alopecia in his father; Cancer in  his paternal grandfather; Coronary artery disease in his father; Diabetes in his brother; Diverticulosis in his father; Heart disease in his brother and father; Hypertension in his mother; Lung cancer in his paternal grandfather; Prostate cancer in his brother. There is no history of Colon cancer. ROS:   Please see the history of present illness.     All other systems reviewed and are negative.  EKGs/Labs/Other Studies Reviewed:    The following studies were reviewed today: No recent studies  EKG:  EKG from 01/09/2019 reviewed: NSR, 69 bpm, no acute abnormality  Recent Labs: 08/11/2018: ALT 37; BUN 12; Creatinine, Ser 1.06; Hemoglobin 14.6; Platelets 352.0; Potassium 4.7; Sodium 141; TSH 4.34   Recent Lipid Panel    Component Value Date/Time   CHOL 188 08/11/2018 0825   TRIG 127.0 08/11/2018 0825   HDL 55.30 08/11/2018 0825   CHOLHDL 3 08/11/2018 0825   VLDL 25.4 08/11/2018 0825   LDLCALC 107 (H) 08/11/2018 0825   LDLDIRECT 127.6 03/05/2014 0841    Physical Exam:    VS:  BP 130/70   Pulse 90   Ht 5\' 9"  (1.753 m)   Wt 190 lb 6.4 oz (86.4 kg)   SpO2 97%   BMI 28.12 kg/m     Wt Readings from Last 3 Encounters:  03/26/19 190 lb 6.4 oz (86.4 kg)  01/09/19 187 lb (84.8 kg)  09/04/18 188 lb 4 oz (85.4 kg)     Physical Exam  Constitutional: He is oriented to person, place, and time. He appears well-developed and well-nourished. No distress.  HENT:  Head: Normocephalic and atraumatic.  Neck: Normal range of motion. Neck supple. No JVD present.  Cardiovascular: Normal rate, regular rhythm, normal heart sounds and intact distal pulses. Exam reveals no gallop and no friction rub.  No murmur heard. Pulmonary/Chest: Effort  normal and breath sounds normal. No respiratory distress. He has no wheezes. He has no rales.  Abdominal: Soft. Bowel sounds are normal.  Musculoskeletal: Normal range of motion.        General: No edema.  Neurological: He is alert and oriented to person, place, and time.  Skin: Skin is warm and dry.  Psychiatric: He has a normal mood and affect. His behavior is normal. Judgment and thought content normal.  Vitals reviewed.    ASSESSMENT:    1. History of sudden cardiac arrest   2. Essential hypertension   3. Automatic implantable cardioverter-defibrillator in situ   4. Other hyperlipidemia    PLAN:    In order of problems listed above:  S/P arrhythmogenic cardiac arrest in 1999 -Pt now has ICD. Is doing well. He had syncope in 2019, per pt neurology feels was due to seizures. He has had no further syncope since starting Lamotrigine. -Will switch metoprolol tartrate 12.5 mg bid to Toprol XL 25 mg daily as pt has a hard time cutting the tiny pills in half.   Hypertension -BP well controlled. Continue diltiazem and metoprolol.   Implantable defibrillator -Followed by Dr. Caryl Comes. Is being set up with home monitor.    Hyperlipidemia -No prior CAD noted.  -On rosuvastatin 20 mg daily. LDL 107 in 08/2018. Cont current therapy.    Medication Adjustments/Labs and Tests Ordered: Current medicines are reviewed at length with the patient today.  Concerns regarding medicines are outlined above. Labs and tests ordered and medication changes are outlined in the patient instructions below:  Patient Instructions  Medication Instructions:  START: Metoprolol Succinate (Toprolol XL) 25 mg once a day  If you need a refill on your cardiac medications before your next appointment, please call your pharmacy.   Lab work: None   If you have labs (blood work) drawn today and your tests are completely normal, you will receive your results only by: Marland Kitchen MyChart Message (if you have MyChart) OR . A  paper copy in the mail If you have any lab test that is abnormal or we need to change your treatment, we will call you to review the results.  Testing/Procedures: None   Follow-Up: At [facility], you and your health needs are our priority.  As part of our continuing mission to provide you with exceptional heart care, we have created designated Provider Care Teams.  These Care Teams include your primary Cardiologist (physician) and Advanced Practice Providers (APPs -  Physician Assistants and Nurse Practitioners) who all work together to provide you with the care you need, when you need it. You will need a follow up appointment in:  12 months.  Please call our office 2 months in advance to schedule this appointment.  You may see Sherren Mocha, MD or one of the following Advanced Practice Providers on your designated Care Team: Richardson Dopp, PA-C [place], [place] . Daune Perch, NP  Any Other Special Instructions Will Be Listed Below (If Applicable).       Signed, Daune Perch, NP  03/27/2019 11:29 AM    [facility]

## 2019-03-26 NOTE — Patient Instructions (Signed)
Medication Instructions:  START: Metoprolol Succinate (Toprolol XL) 25 mg once a day    If you need a refill on your cardiac medications before your next appointment, please call your pharmacy.   Lab work: None   If you have labs (blood work) drawn today and your tests are completely normal, you will receive your results only by: Marland Kitchen MyChart Message (if you have MyChart) OR . A paper copy in the mail If you have any lab test that is abnormal or we need to change your treatment, we will call you to review the results.  Testing/Procedures: None   Follow-Up: At [facility], you and your health needs are our priority.  As part of our continuing mission to provide you with exceptional heart care, we have created designated Provider Care Teams.  These Care Teams include your primary Cardiologist (physician) and Advanced Practice Providers (APPs -  Physician Assistants and Nurse Practitioners) who all work together to provide you with the care you need, when you need it. You will need a follow up appointment in:  12 months.  Please call our office 2 months in advance to schedule this appointment.  You may see Sherren Mocha, MD or one of the following Advanced Practice Providers on your designated Care Team: Richardson Dopp, PA-C [place], [place] . Daune Perch, NP  Any Other Special Instructions Will Be Listed Below (If Applicable).

## 2019-04-05 ENCOUNTER — Ambulatory Visit (INDEPENDENT_AMBULATORY_CARE_PROVIDER_SITE_OTHER): Payer: Medicare Other | Admitting: *Deleted

## 2019-04-05 DIAGNOSIS — I472 Ventricular tachycardia: Secondary | ICD-10-CM

## 2019-04-05 DIAGNOSIS — I4729 Other ventricular tachycardia: Secondary | ICD-10-CM

## 2019-04-05 LAB — CUP PACEART REMOTE DEVICE CHECK
Battery Remaining Longevity: 54 mo
Battery Remaining Percentage: 57 %
Brady Statistic RV Percent Paced: 0 %
Date Time Interrogation Session: 20201001142506
HighPow Impedance: 67 Ohm
Implantable Lead Implant Date: 19990204
Implantable Lead Location: 753860
Implantable Lead Model: 135
Implantable Lead Serial Number: 301213
Implantable Pulse Generator Implant Date: 20111228
Lead Channel Impedance Value: 756 Ohm
Lead Channel Pacing Threshold Amplitude: 1.4 V
Lead Channel Pacing Threshold Pulse Width: 1 ms
Lead Channel Setting Pacing Amplitude: 2.4 V
Lead Channel Setting Pacing Pulse Width: 1 ms
Lead Channel Setting Sensing Sensitivity: 0.4 mV
Pulse Gen Serial Number: 277857

## 2019-04-10 ENCOUNTER — Ambulatory Visit (INDEPENDENT_AMBULATORY_CARE_PROVIDER_SITE_OTHER): Payer: Medicare Other

## 2019-04-10 DIAGNOSIS — Z23 Encounter for immunization: Secondary | ICD-10-CM

## 2019-04-12 ENCOUNTER — Encounter: Payer: Self-pay | Admitting: Cardiology

## 2019-04-12 NOTE — Progress Notes (Signed)
Remote ICD transmission.   

## 2019-04-19 ENCOUNTER — Other Ambulatory Visit: Payer: Self-pay

## 2019-04-19 NOTE — Telephone Encounter (Signed)
Name of Medication: Adderall 10 mg Name of [facility] Last Fill or Written Date and Quantity: #180 on 01/09/19 Last Office Visit and Type: 08/14/18 annual Next Office Visit and Type: none scheduled

## 2019-04-19 NOTE — Telephone Encounter (Signed)
Audelia Acton @ [facility]  [phone] Is calling to get to get Tier exception  for rx Case number [id number]  Fax has been send also

## 2019-04-20 MED ORDER — AMPHETAMINE-DEXTROAMPHETAMINE 10 MG PO TABS: 10.0000 mg | ORAL_TABLET | Freq: Two times a day (BID) | ORAL | 0 refills | Status: DC

## 2019-04-20 NOTE — Telephone Encounter (Signed)
Sent. Thanks.   

## 2019-04-24 ENCOUNTER — Telehealth: Payer: Self-pay | Admitting: Family Medicine

## 2019-04-24 NOTE — Telephone Encounter (Signed)
Spouse called wanting to check to see if pt can get shingles vaccine  Ok to schedule

## 2019-04-24 NOTE — Telephone Encounter (Signed)
Prior Authorization faxed, awaiting response.

## 2019-04-24 NOTE — Telephone Encounter (Signed)
Wife advised that patient needs to check with insurance as to whether they will better pay for the injection here or at the pharmacy and then schedule accordingly.

## 2019-06-04 ENCOUNTER — Other Ambulatory Visit: Payer: Self-pay | Admitting: Family Medicine

## 2019-06-11 NOTE — Progress Notes (Signed)
PATIENT: Larry [name] DOB: 04/18/1951  REASON FOR VISIT: follow up HISTORY FROM: patient  HISTORY OF PRESENT ILLNESS: Today 06/12/19  HISTORY  HISTORY OF PRESENT ILLNESS:[name] is a very friendly 68 year old right-handed gentleman presents for followup consultation of his memory loss in the context of anoxic brain injury.   He is accompanied by his wife today. He was a patient of Dr. 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  EEG showed diffuse slowing in January 1999, CT head and brain MRI were normal. CT head repeat in November 2000 showed slight prominence of the lateral and third ventricles. CT head in July 2003 showed prominence of the ventricular system. Psychological testing and August 1999 showed borderline performance IQ. He became depressed. In January 2014 his MMSE was 29, clock drawing was 4, animal fluency was 10. He went to [facility] for 2 years for patient with degenerative disorder.  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 He lives at home with his wife, still driving. He helps the household, he still has difficulty focusing.   He has had some fluctuation in his memory per wife. He has ADD and continues to take Adderall, 10 mg twice a day, without Adderall, he could not remember the pages that he has read,he is also taking Aricept 10 mg every day, which has been very helpful too.  Up date April 14 2016YY  His overall doing very well, he likes play computer games, which has helped his eye and hands coordination, he had recent trip to [place],  felt overwhelming, he continued to drive short distance,  He is taking Ritalin 10 mg twice a day, Aricept 10 mg once a day, complains of insomnia, he took his last dose of Ritalin at evening time.  UPDATE Apr 21 2015:YY He is now taking Namenda 55m bid and aricept 177mqday, he still has trouble sleeping, He watch movie, stay up late, he continues to have focus difficulty, tends to become agitated.  UPDATE April 27th 2017: YY He still has chronic insomnia, tends to have irregular sleep pattern, stay up late watching TV, he still very active during the day, take Adderall 10 mg twice a day, still drives short distance, mild unsteady gait, tends to hold his left arm to his chest  UPDATE Oct 17th 2017:YY He is with his wife, Larry [name] was hospital, presented with sudden loss of consciousness yesterday April 19 2016, which has brought back a lot of his memory, he has not tried clonazepamfor chronic insomnia, worry about side effect, tried melatonin, still has significant insomnia, he is taking Tylenol pm,   UPDATE March 02 2018: He was follow-up by Ca[facility] Racern 2018, on December 19, 2017, [name], he had sudden onset dizziness, feels like going to pass out, then had transient loss of consciousness, eyes rolled back, staring, lasting for 1 minutes, followed by postevent mild confusion and slurred speech last about 40 minutes, was treated at local hospital.  I personally reviewed CT head without contrast: There was no acute abnormality, generalized atrophy, ventriculomegaly, laboratory evaluation, EKG showed  no significant abnormality.  Similar spells in March 2019, transient dizziness followed by sudden loss of muscle tone, consciousness, extreme fatigue, slept for 1 hour afterwards,  Patient reported that he actually has frequent spells about once a month, he has transient confusion, feels like he is going to pass out, short  lasting,  UPDATE Jun 05 2018: He tolerated lamotrigine very well, taking 100 mg twice a day without significant side effect,  EEG wasnormal. Since lamotrigine, he no longer has recurrent spells of rising sensation transient confusion even though there was no clinical seizure activity observed during those spells, he had those spells about once a week prior to lamotrigine,  Update 12/05/2018 SS: 68 year old male with history of anoxic brain injury in 1999, mild cognitive impairment on Aricept and Namenda, and seizure, most recent seizure was in June 2019.  No change in memory, continued trouble with short-term memory, forgets what he goes into the room to get.  No recurrent seizure, in February 2020, did get a different manufacturer of his lamotrigine, had an episode where he felt like he was going to pass out, but he did not.  He called the pharmacy, got the medication switched back to his regular manufacturer.  Has not had any problems since then.  He continues taking lamotrigine 100 mg twice daily, is tolerating medication well.  He is driving a car, without difficulty, his wife is in the car when he drives.  No episodes of getting lost.  His wife manages their finances.  He has a small garden, he does yard work, housework, works on Cytogeneticist.  Has been doing very well. He manages his own medications.  Update June 12, 2019 SS: He reports he has continued to do well, does notice a decrease in his short-term memory, may lose his train of thought.  He drives a car well, but his wife often drives when they are together.  He has not had any accidents and has not gotten lost.  His wife will set out his medications every day for him to take.  She handles the finances.  He remains active, enjoys yard work.  He has not had recurrent seizure.  He has not had any falls.  Overall, is doing well.  He remains on Aricept, Namenda, and Lamictal.  REVIEW OF SYSTEMS: Out of a complete 14 system review of  symptoms, the patient complains only of the following symptoms, and all other reviewed systems are negative.  Memory loss, seizures  ALLERGIES: Allergies  Allergen Reactions   Penicillins     Difficult Breathing, rash    HOME MEDICATIONS: Outpatient Medications Prior to Visit  Medication Sig Dispense Refill   amphetamine-dextroamphetamine (ADDERALL) 10 MG tablet Take 1 tablet (10 mg total) by mouth 2 (two) times daily. 180 tablet 0   Ascorbic Acid (VITAMIN C) 500 MG tablet Take 500 mg by mouth daily.       aspirin 81 MG tablet Take 81 mg by mouth daily.     diltiazem (CARDIZEM CD) 120 MG 24 hr capsule TAKE 1 CAPSULE DAILY. 90 capsule 3   donepezil (ARICEPT) 10 MG tablet Take 1 tablet (10 mg total) by mouth daily. 90 tablet 4   fexofenadine (ALLEGRA) 180 MG tablet Take 180 mg by mouth daily.       Ginkgo Biloba 40 MG TABS Take by mouth as directed.     lamoTRIgine (LAMICTAL) 100 MG tablet Take 1 tablet (100 mg total) by mouth 2 (two) times daily. 180 tablet 4  levothyroxine (SYNTHROID) 100 MCG tablet Take 1 tablet (100 mcg total) by mouth daily before breakfast. 90 tablet 3   Melatonin 5 MG CAPS Take by mouth as directed.     memantine (NAMENDA) 10 MG tablet Take 1 tablet (10 mg total) by mouth 2 (two) times daily. 180 tablet 4   metoprolol succinate (TOPROL XL) 25 MG 24 hr tablet Take 1 tablet (25 mg total) by mouth daily. 90 tablet 3   montelukast (SINGULAIR) 10 MG tablet TAKE 1 TABLET DAILY 90 tablet 3   Multiple Vitamin (MULTIVITAMIN) tablet Take 1 tablet by mouth daily.       NON FORMULARY Focus factor 1 tab po bid      rosuvastatin (CRESTOR) 20 MG tablet Take 1 tablet (20 mg total) by mouth at bedtime. 90 tablet 3   No facility-administered medications prior to visit.     PAST MEDICAL HISTORY: Past Medical History:  Diagnosis Date   ADD (attention deficit disorder)    Allergy    seasonal flares of perinneal allergies   Cancer ([place])    melanoma    Cardiac arrest ([place]) 07/1997   aborted   Dementia ([place])    post resusitative   Depression    Diverticulosis    Family hx of prostate cancer    GERD (gastroesophageal reflux disease)    Headache(784.0)    Hyperlipidemia    Hypothyroidism    ICD (implantable cardiac defibrillator) in place    Idiopathic urticaria    Internal hemorrhoids    Low back pain syndrome    Melanoma in situ of skin of trunk (HCC)    chest   PVC (premature ventricular contraction)    associated with cardiac arrest   Sleep apnea    no c-pap   Tubular adenoma of colon 10/2010    PAST SURGICAL HISTORY: Past Surgical History:  Procedure Laterality Date    implantation x2     CARDIAC DEFIBRILLATOR PLACEMENT  07/01/2010   Explanation of a previously implanted device, pocket revision, and insertion of a new device, and intraoperative defibrillation threshold testing Caryl Comes)   [place]  2004   Defibrillator change Caryl Comes) 3 times changed   HEMORRHOID SURGERY     ICD     TONSILLECTOMY      FAMILY HISTORY: Family History  Problem Relation Age of Onset   Hypertension Mother    Heart disease Father    Diverticulosis Father    Coronary artery disease Father    Alopecia Father    Prostate cancer Brother    Lung cancer Paternal Grandfather        Smoker   Cancer Paternal Grandfather        lung cancer - smoker   Diabetes Brother    Heart disease Brother        heart transplant   Colon cancer Neg Hx     SOCIAL HISTORY: Social History   Socioeconomic History   Marital status: Married    Spouse name: [name]   Number of children: 2   Years of education: Secretary/administrator   Highest education level: Not on file  Occupational History    Employer: DISABLED  Social Needs   Financial resource strain: Not on file   Food insecurity    Worry: Not on file    Inability: Not on file   Transportation needs    Medical: Not on file    Non-medical: Not on  file  Tobacco Use   Smoking status: Former Smoker  Packs/day: 1.00    Years: 10.00    Pack years: 10.00    Types: Cigarettes    Quit date: 07/06/1995    Years since quitting: 23.9   Smokeless tobacco: Former Systems developer    Types: Chew    Quit date: 11/04/1994  Substance and Sexual Activity   Alcohol use: No    Alcohol/week: 0.0 standard drinks    Comment: socially   Drug use: No   Sexual activity: Not on file  Lifestyle   Physical activity    Days per week: Not on file    Minutes per session: Not on file   Stress: Not on file  Relationships   Social connections    Talks on phone: Not on file    Gets together: Not on file    Attends religious service: Not on file    Active member of club or organization: Not on file    Attends meetings of clubs or organizations: Not on file    Relationship status: Not on file   Intimate partner violence    Fear of current or ex partner: Not on file    Emotionally abused: Not on file    Physically abused: Not on file    Forced sexual activity: Not on file  Other Topics Concern   Not on file  Social History Narrative   Patient is married [name]) [phone] with 2 daughters   5 grandchildren   Daily caffeine use - one cup of coffee and one-two cokes per day   Patient is right-handed.   Patient has a college education    PHYSICAL EXAM  Vitals:   06/12/19 0930 06/12/19 0941  BP: (!) 149/92 (!) 144/92  Pulse: 80   Temp: (!) 97 F (36.1 C)   Weight: 189 lb 9.6 oz (86 kg)   Height: _0  (1.753 m)    Body mass index is 28 kg/m.  Generalized: Well developed, in no acute distress  MMSE - Mini Mental State Exam 06/12/2019 03/02/2018 08/03/2017  Orientation to time _1 Orientation to Place _2 Registration _3 Attention/ Calculation 1 5 0  Recall 3 1 0  Recall-comments - - unable to recall 3 of 3 words  Language- name 2 objects 2 2 0  Language- repeat _4 Language- follow 3 step command _5 Language- read & follow  direction 1 1 0  Write a sentence 1 1 0  Copy design 1 1 0  Copy design-comments named 15 animals - -  Total score _6 Neurological examination  Mentation: Alert oriented to time, place, history taking. Follows all commands speech and language fluent Cranial nerve II-XII: Pupils were equal round reactive to light. Extraocular movements were full, visual field were full on confrontational test. Facial sensation and strength were normal.  Head turning and shoulder shrug  were normal and symmetric. Motor: The motor testing reveals 5 over 5 strength of all 4 extremities. Good symmetric motor tone is noted throughout.  Sensory: Sensory testing is intact to soft touch on all 4 extremities. No evidence of extinction is noted.  Coordination: Cerebellar testing reveals good finger-nose-finger and heel-to-shin bilaterally.  Gait and station: Gait is normal. Tandem gait is unsteady. Reflexes: Deep tendon reflexes are symmetric and normal bilaterally.   DIAGNOSTIC DATA (LABS, IMAGING, TESTING) - I reviewed patient records, labs, notes, testing and imaging myself where available.  Lab Results  Component Value Date  WBC 7.4 08/11/2018   HGB 14.6 08/11/2018   HCT 42.8 08/11/2018   MCV 92.3 08/11/2018   PLT 352.0 08/11/2018      Component Value Date/Time   NA 141 08/11/2018 0825   K 4.7 08/11/2018 0825   CL 103 08/11/2018 0825   CO2 30 08/11/2018 0825   GLUCOSE 95 08/11/2018 0825   BUN 12 08/11/2018 0825   CREATININE 1.06 08/11/2018 0825   CREATININE 1.07 12/23/2017 1451   CALCIUM 9.7 08/11/2018 0825   PROT 6.7 08/11/2018 0825   ALBUMIN 4.6 08/11/2018 0825   AST 24 08/11/2018 0825   ALT 37 08/11/2018 0825   ALKPHOS 73 08/11/2018 0825   BILITOT 0.5 08/11/2018 0825   GFRNONAA 95.91 03/12/2010 0841   GFRAA  01/25/2008 1236    >60        The eGFR has been calculated using the MDRD equation. This calculation has not been validated in all clinical   Lab Results  Component Value  Date   CHOL 188 08/11/2018   HDL 55.30 08/11/2018   LDLCALC 107 (H) 08/11/2018   LDLDIRECT 127.6 03/05/2014   TRIG 127.0 08/11/2018   CHOLHDL 3 08/11/2018   No results found for: HGBA1C No results found for: VITAMINB12 Lab Results  Component Value Date   TSH 4.34 08/11/2018   ASSESSMENT AND PLAN 68 y.o. year old male  has a past medical history of ADD (attention deficit disorder), Allergy, Cancer ([place]), Cardiac arrest ([place]) (07/1997), Dementia ([place]), Depression, Diverticulosis, Family hx of prostate cancer, GERD (gastroesophageal reflux disease), Headache(784.0), Hyperlipidemia, Hypothyroidism, ICD (implantable cardiac defibrillator) in place, Idiopathic urticaria, Internal hemorrhoids, Low back pain syndrome, Melanoma in situ of skin of trunk ([place]), PVC (premature ventricular contraction), Sleep apnea, and Tubular adenoma of colon (10/2010). here with:  1.  Anoxic brain injury 1999, mild cognitive impairment -Memory score is relatively stable, patient reports some decline in short-term memory -Continue Aricept 10 mg daily, Namenda 10 mg twice a day -Encouraged to continue consistent activity  2.  Seizure -Most recent seizure was in June 2019, EEG was normal -Continue lamotrigine 100 mg twice a day -I will check routine lab work today -He will follow-up in 6 months or sooner if needed, he will call for recurrent seizure  I spent 15 minutes with the patient. 50% of this time was spent discussing his plan of care.  Butler Denmark, AGNP-C, DNP 06/12/2019, 9:53 AM [facility] [address], [address], [place] [address] [phone]

## 2019-06-12 ENCOUNTER — Encounter: Payer: Self-pay | Admitting: Neurology

## 2019-06-12 ENCOUNTER — Ambulatory Visit: Payer: Medicare Other | Admitting: Neurology

## 2019-06-12 ENCOUNTER — Other Ambulatory Visit: Payer: Self-pay

## 2019-06-12 VITALS — BP 144/92 | HR 80 | Temp 97.0°F | Ht 69.0 in | Wt 189.6 lb

## 2019-06-12 DIAGNOSIS — R569 Unspecified convulsions: Secondary | ICD-10-CM | POA: Diagnosis not present

## 2019-06-12 DIAGNOSIS — R413 Other amnesia: Secondary | ICD-10-CM | POA: Diagnosis not present

## 2019-06-12 MED ORDER — LAMOTRIGINE 100 MG PO TABS: 100.0000 mg | ORAL_TABLET | Freq: Two times a day (BID) | ORAL | 4 refills | Status: DC

## 2019-06-12 NOTE — Patient Instructions (Signed)
Everything looks good :)  Continue current medications.  I will check lab work today   Return in 6 months or sooner if needed

## 2019-06-13 LAB — COMPREHENSIVE METABOLIC PANEL
ALT: 40 IU/L (ref 0–44)
AST: 32 IU/L (ref 0–40)
Albumin/Globulin Ratio: 2 (ref 1.2–2.2)
Albumin: 4.4 g/dL (ref 3.8–4.8)
Alkaline Phosphatase: 102 IU/L (ref 39–117)
BUN/Creatinine Ratio: 14 (ref 10–24)
BUN: 13 mg/dL (ref 8–27)
Bilirubin Total: 0.3 mg/dL (ref 0.0–1.2)
CO2: 24 mmol/L (ref 20–29)
Calcium: 9.2 mg/dL (ref 8.6–10.2)
Chloride: 102 mmol/L (ref 96–106)
Creatinine, Ser: 0.91 mg/dL (ref 0.76–1.27)
GFR calc Af Amer: 101 mL/min/{1.73_m2} (ref >59)
GFR calc non Af Amer: 88 mL/min/{1.73_m2} (ref >59)
Globulin, Total: 2.2 g/dL (ref 1.5–4.5)
Glucose: 117 mg/dL — ABNORMAL HIGH (ref 65–99)
Potassium: 4 mmol/L (ref 3.5–5.2)
Sodium: 140 mmol/L (ref 134–144)
Total Protein: 6.6 g/dL (ref 6.0–8.5)

## 2019-06-13 LAB — CBC WITH DIFFERENTIAL/PLATELET
Basophils Absolute: 0 x10E3/uL (ref 0.0–0.2)
Basos: 0 %
EOS (ABSOLUTE): 0.1 x10E3/uL (ref 0.0–0.4)
Eos: 2 %
Hematocrit: 41.5 % (ref 37.5–51.0)
Hemoglobin: 14.6 g/dL (ref 13.0–17.7)
Immature Grans (Abs): 0 x10E3/uL (ref 0.0–0.1)
Immature Granulocytes: 0 %
Lymphocytes Absolute: 2.5 x10E3/uL (ref 0.7–3.1)
Lymphs: 45 %
MCH: 31.9 pg (ref 26.6–33.0)
MCHC: 35.2 g/dL (ref 31.5–35.7)
MCV: 91 fL (ref 79–97)
Monocytes Absolute: 0.6 x10E3/uL (ref 0.1–0.9)
Monocytes: 12 %
Neutrophils Absolute: 2.2 x10E3/uL (ref 1.4–7.0)
Neutrophils: 41 %
Platelets: 310 x10E3/uL (ref 150–450)
RBC: 4.58 x10E6/uL (ref 4.14–5.80)
RDW: 12.1 % (ref 11.6–15.4)
WBC: 5.4 x10E3/uL (ref 3.4–10.8)

## 2019-06-13 LAB — LAMOTRIGINE LEVEL: Lamotrigine Lvl: 4.7 ug/mL (ref 2.0–20.0)

## 2019-06-14 ENCOUNTER — Telehealth: Payer: Self-pay

## 2019-06-14 NOTE — Telephone Encounter (Signed)
Called an was able to speak to both wife and Pt. Wife is on the [place]. Went over recent labs per Butler Denmark. Demonstrated understanding not question nor no concerns.   "Please call the patient. Lamictal level is therapeutic. Lab work is normal with exception of glucose mildly high at 117, but was a random draw." -NP Butler Denmark

## 2019-07-09 ENCOUNTER — Ambulatory Visit (INDEPENDENT_AMBULATORY_CARE_PROVIDER_SITE_OTHER): Payer: Medicare Other | Admitting: *Deleted

## 2019-07-09 DIAGNOSIS — I472 Ventricular tachycardia: Secondary | ICD-10-CM | POA: Diagnosis not present

## 2019-07-09 DIAGNOSIS — I4729 Other ventricular tachycardia: Secondary | ICD-10-CM

## 2019-07-09 LAB — CUP PACEART REMOTE DEVICE CHECK
Battery Remaining Longevity: 48 mo
Battery Remaining Percentage: 54 %
Brady Statistic RV Percent Paced: 0 %
Date Time Interrogation Session: 20210104032100
HighPow Impedance: 64 Ohm
Implantable Lead Implant Date: 19990204
Implantable Lead Location: 753860
Implantable Lead Model: 135
Implantable Lead Serial Number: 301213
Implantable Pulse Generator Implant Date: 20111228
Lead Channel Impedance Value: 806 Ohm
Lead Channel Pacing Threshold Amplitude: 1.4 V
Lead Channel Pacing Threshold Pulse Width: 1 ms
Lead Channel Setting Pacing Amplitude: 2.4 V
Lead Channel Setting Pacing Pulse Width: 1 ms
Lead Channel Setting Sensing Sensitivity: 0.4 mV
Pulse Gen Serial Number: 277857

## 2019-07-27 ENCOUNTER — Other Ambulatory Visit: Payer: Self-pay

## 2019-07-27 NOTE — Telephone Encounter (Signed)
Patient called to let us know he changed pharmacy to [facility] now. I updated his chart with this information, he is also requesting Adderall refill 90 day supply.  Note   Name of Medication: Adderall 10 mg Name of Pharmacy: [facility] Last Fill or Written Date and Quantity: #180 on 04/20/2019 Last Office Visit and Type: 08/14/18 annual Next Office Visit and Type: none scheduled Last UDS: 04/23/2014 No contract on file.

## 2019-07-29 MED ORDER — AMPHETAMINE-DEXTROAMPHETAMINE 10 MG PO TABS: 10.0000 mg | ORAL_TABLET | Freq: Two times a day (BID) | ORAL | 0 refills | Status: DC

## 2019-07-29 NOTE — Telephone Encounter (Signed)
Sent.  Please schedule AMW visit when possible.  Thanks.

## 2019-07-30 ENCOUNTER — Telehealth: Payer: Self-pay

## 2019-07-30 MED ORDER — MONTELUKAST SODIUM 10 MG PO TABS: 10.0000 mg | ORAL_TABLET | Freq: Every day | ORAL | 0 refills | Status: DC

## 2019-07-30 MED ORDER — LEVOTHYROXINE SODIUM 100 MCG PO TABS: 100.0000 ug | ORAL_TABLET | Freq: Every day | ORAL | 0 refills | Status: DC

## 2019-07-30 NOTE — Telephone Encounter (Signed)
Please schedule patient for AWV and CPE when able to. Thank you Last CPE 08/14/2018

## 2019-07-30 NOTE — Telephone Encounter (Signed)
Letter mailed

## 2019-07-31 ENCOUNTER — Other Ambulatory Visit: Payer: Self-pay | Admitting: *Deleted

## 2019-07-31 NOTE — Telephone Encounter (Signed)
Patient scheduled.

## 2019-08-01 ENCOUNTER — Other Ambulatory Visit: Payer: Self-pay | Admitting: *Deleted

## 2019-08-01 DIAGNOSIS — R413 Other amnesia: Secondary | ICD-10-CM

## 2019-08-01 MED ORDER — MEMANTINE HCL 10 MG PO TABS: 10.0000 mg | ORAL_TABLET | Freq: Two times a day (BID) | ORAL | 1 refills | Status: DC

## 2019-08-01 MED ORDER — LAMOTRIGINE 100 MG PO TABS: 100.0000 mg | ORAL_TABLET | Freq: Two times a day (BID) | ORAL | 1 refills | Status: DC

## 2019-08-01 MED ORDER — DONEPEZIL HCL 10 MG PO TABS: 10.0000 mg | ORAL_TABLET | Freq: Every day | ORAL | 1 refills | Status: DC

## 2019-08-26 ENCOUNTER — Other Ambulatory Visit: Payer: Self-pay | Admitting: Family Medicine

## 2019-08-26 DIAGNOSIS — Z125 Encounter for screening for malignant neoplasm of prostate: Secondary | ICD-10-CM

## 2019-08-26 DIAGNOSIS — I10 Essential (primary) hypertension: Secondary | ICD-10-CM

## 2019-08-26 DIAGNOSIS — E039 Hypothyroidism, unspecified: Secondary | ICD-10-CM

## 2019-08-28 DIAGNOSIS — H2513 Age-related nuclear cataract, bilateral: Secondary | ICD-10-CM | POA: Diagnosis not present

## 2019-08-28 DIAGNOSIS — H524 Presbyopia: Secondary | ICD-10-CM | POA: Diagnosis not present

## 2019-08-28 DIAGNOSIS — H0288B Meibomian gland dysfunction left eye, upper and lower eyelids: Secondary | ICD-10-CM | POA: Diagnosis not present

## 2019-08-28 DIAGNOSIS — H0288A Meibomian gland dysfunction right eye, upper and lower eyelids: Secondary | ICD-10-CM | POA: Diagnosis not present

## 2019-08-28 DIAGNOSIS — H5203 Hypermetropia, bilateral: Secondary | ICD-10-CM | POA: Diagnosis not present

## 2019-08-29 ENCOUNTER — Ambulatory Visit (INDEPENDENT_AMBULATORY_CARE_PROVIDER_SITE_OTHER): Payer: Medicare Other

## 2019-08-29 ENCOUNTER — Other Ambulatory Visit: Payer: Self-pay

## 2019-08-29 ENCOUNTER — Other Ambulatory Visit (INDEPENDENT_AMBULATORY_CARE_PROVIDER_SITE_OTHER): Payer: Medicare Other

## 2019-08-29 DIAGNOSIS — Z0000 Encounter for general adult medical examination without abnormal findings: Secondary | ICD-10-CM | POA: Diagnosis not present

## 2019-08-29 DIAGNOSIS — I10 Essential (primary) hypertension: Secondary | ICD-10-CM

## 2019-08-29 DIAGNOSIS — Z125 Encounter for screening for malignant neoplasm of prostate: Secondary | ICD-10-CM

## 2019-08-29 DIAGNOSIS — E039 Hypothyroidism, unspecified: Secondary | ICD-10-CM

## 2019-08-29 DIAGNOSIS — R739 Hyperglycemia, unspecified: Secondary | ICD-10-CM | POA: Diagnosis not present

## 2019-08-29 LAB — LIPID PANEL
Cholesterol: 190 mg/dL (ref 0–200)
HDL: 48.8 mg/dL (ref >39.00)
LDL Cholesterol: 106 mg/dL — ABNORMAL HIGH (ref 0–99)
NonHDL: 141.47
Total CHOL/HDL Ratio: 4
Triglycerides: 176 mg/dL — ABNORMAL HIGH (ref 0.0–149.0)
VLDL: 35.2 mg/dL (ref 0.0–40.0)

## 2019-08-29 LAB — PSA, MEDICARE: PSA: 0.17 ng/ml (ref 0.10–4.00)

## 2019-08-29 LAB — TSH: TSH: 6.29 u[IU]/mL — ABNORMAL HIGH (ref 0.35–4.50)

## 2019-08-29 NOTE — Progress Notes (Signed)
PCP notes:  Health Maintenance: Tdap- insurance/financial   Abnormal Screenings: none   Patient concerns: none   Nurse concerns: none   Next PCP appt.: 09/04/2019 @ 12 pm

## 2019-08-29 NOTE — Progress Notes (Signed)
Subjective:   Larry [name] is a 69 y.o. male who presents for Medicare Annual/Subsequent preventive examination.  Review of Systems: N/A   This visit is being conducted through telemedicine via telephone at the nurse health advisor's home address due to the COVID-19 pandemic. This patient has given me verbal consent via doximity to conduct this visit, patient states they are participating from their home address. Patient and myself are on the telephone call. There is no referral for this visit. Some vital signs may be absent or patient reported.    Patient identification: identified by name, DOB, and current address   Cardiac Risk Factors include: advanced age (>44men, >31 women);hypertension;dyslipidemia;male gender     Objective:    Vitals: There were no vitals taken for this visit.  There is no height or weight on file to calculate BMI.  Advanced Directives 08/29/2019 08/03/2017  Does Patient Have a Medical Advance Directive? Yes No  Type of Paramedic of [place];Living will -  Copy of [place] in Chart? No - copy requested -  Would patient like information on creating a medical advance directive? - Yes (MAU/Ambulatory/Procedural Areas - Information given)    Tobacco Social History   Tobacco Use  Smoking Status Former Smoker  . Packs/day: 1.00  . Years: 10.00  . Pack years: 10.00  . Types: Cigarettes  . Quit date: 07/06/1995  . Years since quitting: 24.1  Smokeless Tobacco Former Systems developer  . Types: Chew  . Quit date: 11/04/1994     Counseling given: Not Answered   Clinical Intake:  Pre-visit preparation completed: Yes  Pain : No/denies pain     Nutritional Risks: None Diabetes: No  How often do you need to have someone help you when you read instructions, pamphlets, or other written materials from your doctor or pharmacy?: 1 - Never What is the last grade level you completed in school?: 2 years of  college  Interpreter Needed?: No  Information entered by :: CJohnson, LPN  Past Medical History:  Diagnosis Date  . ADD (attention deficit disorder)   . Allergy    seasonal flares of perinneal allergies  . Cancer ([place])    melanoma  . Cardiac arrest ([place]) 07/1997   aborted  . Dementia ([place])    post resusitative  . Depression   . Diverticulosis   . Family hx of prostate cancer   . GERD (gastroesophageal reflux disease)   . Headache(784.0)   . Hyperlipidemia   . Hypothyroidism   . ICD (implantable cardiac defibrillator) in place   . Idiopathic urticaria   . Internal hemorrhoids   . Low back pain syndrome   . Melanoma in situ of skin of trunk (HCC)    chest  . PVC (premature ventricular contraction)    associated with cardiac arrest  . Sleep apnea    no c-pap  . Tubular adenoma of colon 10/2010   Past Surgical History:  Procedure Laterality Date  .  implantation x2    . CARDIAC DEFIBRILLATOR PLACEMENT  07/01/2010   Explanation of a previously implanted device, pocket revision, and insertion of a new device, and intraoperative defibrillation threshold testing Caryl Comes)  . CARDIAC DEFIBRILLATOR REMOVAL  2004   Defibrillator change Caryl Comes) 3 times changed  . HEMORRHOID SURGERY    . ICD    . TONSILLECTOMY     Family History  Problem Relation Age of Onset  . Hypertension Mother   . Heart disease Father   .  Diverticulosis Father   . Coronary artery disease Father   . Alopecia Father   . Prostate cancer Brother   . Lung cancer Paternal Grandfather        Smoker  . Cancer Paternal Grandfather        lung cancer - smoker  . Diabetes Brother   . Heart disease Brother        heart transplant  . Colon cancer Neg Hx    Social History   Socioeconomic History  . Marital status: Married    Spouse name: [name]  . Number of children: 2  . Years of education: College  . Highest education level: Not on file  Occupational History    Employer: DISABLED  Tobacco Use  .  Smoking status: Former Smoker    Packs/day: 1.00    Years: 10.00    Pack years: 10.00    Types: Cigarettes    Quit date: 07/06/1995    Years since quitting: 24.1  . Smokeless tobacco: Former Systems developer    Types: [place] date: 11/04/1994  Substance and Sexual Activity  . Alcohol use: No    Alcohol/week: 0.0 standard drinks    Comment: socially  . Drug use: No  . Sexual activity: Not on file  Other Topics Concern  . Not on file  Social History Narrative   Patient is married [name]) [phone] with 2 daughters   5 grandchildren   Daily caffeine use - one cup of coffee and one-two cokes per day   Patient is right-handed.   Patient has a college education   Social Determinants of Radio broadcast assistant Strain: Low Risk   . Difficulty of Paying Living Expenses: Not hard at all  Food Insecurity: No Food Insecurity  . Worried About Charity fundraiser in the Last Year: Never true  . Ran Out of Food in the Last Year: Never true  Transportation Needs: No Transportation Needs  . Lack of Transportation (Medical): No  . Lack of Transportation (Non-Medical): No  Physical Activity: Inactive  . Days of Exercise per Week: 0 days  . Minutes of Exercise per Session: 0 min  Stress: No Stress Concern Present  . Feeling of Stress : Not at all  Social Connections:   . Frequency of Communication with Friends and Family: Not on file  . Frequency of Social Gatherings with Friends and Family: Not on file  . Attends Religious Services: Not on file  . Active Member of Clubs or Organizations: Not on file  . Attends Archivist Meetings: Not on file  . Marital Status: Not on file    Outpatient Encounter Medications as of 08/29/2019  Medication Sig  . amphetamine-dextroamphetamine (ADDERALL) 10 MG tablet Take 1 tablet (10 mg total) by mouth 2 (two) times daily.  . Ascorbic Acid (VITAMIN C) 500 MG tablet Take 500 mg by mouth daily.    Marland Kitchen aspirin 81 MG tablet Take 81 mg by mouth daily.  Marland Kitchen  diltiazem (CARDIZEM CD) 120 MG 24 hr capsule TAKE 1 CAPSULE DAILY.  Marland Kitchen donepezil (ARICEPT) 10 MG tablet Take 1 tablet (10 mg total) by mouth daily.  . fexofenadine (ALLEGRA) 180 MG tablet Take 180 mg by mouth daily.    . Ginkgo Biloba 40 MG TABS Take by mouth as directed.  . lamoTRIgine (LAMICTAL) 100 MG tablet Take 1 tablet (100 mg total) by mouth 2 (two) times daily.  Marland Kitchen levothyroxine (SYNTHROID) 100 MCG tablet Take 1 tablet (100  mcg total) by mouth daily before breakfast.  . Melatonin 5 MG CAPS Take by mouth as directed.  . memantine (NAMENDA) 10 MG tablet Take 1 tablet (10 mg total) by mouth 2 (two) times daily.  . metoprolol succinate (TOPROL XL) 25 MG 24 hr tablet Take 1 tablet (25 mg total) by mouth daily.  . montelukast (SINGULAIR) 10 MG tablet Take 1 tablet (10 mg total) by mouth daily.  . Multiple Vitamin (MULTIVITAMIN) tablet Take 1 tablet by mouth daily.    . NON FORMULARY Focus factor 1 tab po bid   . rosuvastatin (CRESTOR) 20 MG tablet Take 1 tablet (20 mg total) by mouth at bedtime.   No facility-administered encounter medications on file as of 08/29/2019.    Activities of Daily Living In your present state of health, do you have any difficulty performing the following activities: 08/29/2019  Hearing? Y  Comment "roaring in my ears"  Vision? N  Difficulty concentrating or making decisions? Y  Comment had a brain injury  Walking or climbing stairs? N  Dressing or bathing? N  Doing errands, shopping? N  Preparing Food and eating ? N  Using the Toilet? N  In the past six months, have you accidently leaked urine? N  Do you have problems with loss of bowel control? N  Managing your Medications? N  Managing your Finances? N  Housekeeping or managing your Housekeeping? N  Some recent data might be hidden    Patient Care Team: [name], MD as PCP - General (Family Medicine) Deboraha Sprang, MD as PCP - Electrophysiology (Cardiology) Sherren Mocha, MD as PCP -  Cardiology (Cardiology)   Assessment:   This is a routine wellness examination for Bayani.  Exercise Activities and Dietary recommendations Current Exercise Habits: The patient does not participate in regular exercise at present, Exercise limited by: None identified  Goals    . Increase physical activity     Starting 08/03/2017, I will continue to walk at least 30 minutes daily and to use [employer] as tolerated.     . Patient Stated     08/29/2019, I will start exercising more daily for about 30 minutes.       Fall Risk Fall Risk  08/29/2019 08/14/2018 08/03/2017 05/27/2015 03/07/2014  Falls in the past year? 0 0 No No No  Number falls in past yr: 0 - - - -  Injury with Fall? 0 - - - -  Risk for fall due to : Medication side effect - - - -  Follow up Falls evaluation completed;Falls prevention discussed - - - -   Is the patient's home free of loose throw rugs in walkways, pet beds, electrical cords, etc?   yes      Grab bars in the bathroom? yes      Handrails on the stairs?   yes      Adequate lighting?   yes  Timed Get Up and Go Performed: N/A  Depression Screen PHQ 2/9 Scores 08/29/2019 08/14/2018 08/03/2017 05/27/2015  PHQ - 2 Score 0 0 0 0  PHQ- 9 Score 0 - 0 -    Cognitive Function MMSE - Mini Mental State Exam 08/29/2019 06/12/2019 03/02/2018 08/03/2017 06/21/2017  Orientation to time 5 5 4 5 5   Orientation to Place 5 5 5 5 5   Registration 3 3 3 3 3   Attention/ Calculation 5 1 5  0 5  Recall 3 3 1  0 1  Recall-comments - - - unable to recall  3 of 3 words -  Language- name 2 objects - 2 2 0 2  Language- repeat 1 1 1 1  0  Language- follow 3 step command - 3 3 3 3   Language- read & follow direction - 1 1 0 1  Write a sentence - 1 1 0 1  Copy design - 1 1 0 1  Copy design-comments - named 15 animals - - -  Total score - 26 27 17 27   Mini Cog  Mini-Cog screen was completed. Maximum score is 22. A value of 0 denotes this part of the MMSE was not completed or the patient  failed this part of the Mini-Cog screening.       Immunization History  Administered Date(s) Administered  . Fluad Quad(high Dose 65+) 04/10/2019  . Influenza Whole 05/01/2013  . Influenza,inj,Quad PF,6+ Mos 05/27/2015, 06/01/2016, 04/19/2017, 04/20/2018  . Pneumococcal Conjugate-13 08/03/2017  . Pneumococcal Polysaccharide-23 08/14/2018  . Td 01/02/1997, 11/20/2008    Qualifies for Shingles Vaccine: Yes  Screening Tests Health Maintenance  Topic Date Due  . TETANUS/TDAP  08/28/2020 (Originally 11/21/2018)  . COLONOSCOPY  11/23/2020  . INFLUENZA VACCINE  Completed  . Hepatitis C Screening  Completed  . PNA vac Low Risk Adult  Completed   Cancer Screenings: Lung: Low Dose CT Chest recommended if Age 58-80 years, 30 pack-year currently smoking OR have quit w/in 15 years. Patient does not qualify. Colorectal: completed 11/24/2015  Additional Screenings:  Hepatitis C Screening: 05/26/2016      Plan:   Patient will start exercising more daily for about 30 minutes.  I have personally reviewed and noted the following in the patient's chart:   . Medical and social history . Use of alcohol, tobacco or illicit drugs  . Current medications and supplements . Functional ability and status . Nutritional status . Physical activity . Advanced directives . List of other physicians . Hospitalizations, surgeries, and ER visits in previous 12 months . Vitals . Screenings to include cognitive, depression, and falls . Referrals and appointments  In addition, I have reviewed and discussed with patient certain preventive protocols, quality metrics, and best practice recommendations. A written personalized care plan for preventive services as well as general preventive health recommendations were provided to patient.     Andrez Grime, LPN  [id number]

## 2019-08-29 NOTE — Addendum Note (Signed)
Addended by: Ellamae Sia on: 08/29/2019 08:31 AM   Modules accepted: Orders

## 2019-08-29 NOTE — Patient Instructions (Signed)
[name] , Thank you for taking time to come for your Medicare Wellness Visit. I appreciate your ongoing commitment to your health goals. Please review the following plan we discussed and let me know if I can assist you in the future.   Screening recommendations/referrals: Colonoscopy: Up to date, completed 11/24/2015 Recommended yearly ophthalmology/optometry visit for glaucoma screening and checkup Recommended yearly dental visit for hygiene and checkup  Vaccinations: Influenza vaccine: Up to date, completed 04/10/2019 Pneumococcal vaccine: Completed series Tdap vaccine: decline Shingles vaccine: discussed    Advanced directives: Please bring a copy of your POA (Power of Attorney) and/or Living Will to your next appointment.  Conditions/risks identified: hypertension, hyperlipidemia  Next appointment: 09/04/2019 @ 12 pm   Preventive Care 65 Years and Older, Male Preventive care refers to lifestyle choices and visits with your health care provider that can promote health and wellness. What does preventive care include?  A yearly physical exam. This is also called an annual well check.  Dental exams once or twice a year.  Routine eye exams. Ask your health care provider how often you should have your eyes checked.  Personal lifestyle choices, including:  Daily care of your teeth and gums.  Regular physical activity.  Eating a healthy diet.  Avoiding tobacco and drug use.  Limiting alcohol use.  Practicing safe sex.  Taking low doses of aspirin every day.  Taking vitamin and mineral supplements as recommended by your health care provider. What happens during an annual well check? The services and screenings done by your health care provider during your annual well check will depend on your age, overall health, lifestyle risk factors, and family history of disease. Counseling  Your health care provider may ask you questions about your:  Alcohol use.  Tobacco  use.  Drug use.  Emotional well-being.  Home and relationship well-being.  Sexual activity.  Eating habits.  History of falls.  Memory and ability to understand (cognition).  Work and work Statistician. Screening  You may have the following tests or measurements:  Height, weight, and BMI.  Blood pressure.  Lipid and cholesterol levels. These may be checked every 5 years, or more frequently if you are over 28 years old.  Skin check.  Lung cancer screening. You may have this screening every year starting at age 12 if you have a 30-pack-year history of smoking and currently smoke or have quit within the past 15 years.  Fecal occult blood test (FOBT) of the stool. You may have this test every year starting at age 5.  Flexible sigmoidoscopy or colonoscopy. You may have a sigmoidoscopy every 5 years or a colonoscopy every 10 years starting at age 83.  Prostate cancer screening. Recommendations will vary depending on your family history and other risks.  Hepatitis C blood test.  Hepatitis B blood test.  Sexually transmitted disease (STD) testing.  Diabetes screening. This is done by checking your blood sugar (glucose) after you have not eaten for a while (fasting). You may have this done every 1-3 years.  Abdominal aortic aneurysm (AAA) screening. You may need this if you are a current or former smoker.  Osteoporosis. You may be screened starting at age 28 if you are at high risk. Talk with your health care provider about your test results, treatment options, and if necessary, the need for more tests. Vaccines  Your health care provider may recommend certain vaccines, such as:  Influenza vaccine. This is recommended every year.  Tetanus, diphtheria, and acellular pertussis (  Tdap, Td) vaccine. You may need a Td booster every 10 years.  Zoster vaccine. You may need this after age 42.  Pneumococcal 13-valent conjugate (PCV13) vaccine. One dose is recommended after age  57.  Pneumococcal polysaccharide (PPSV23) vaccine. One dose is recommended after age 2. Talk to your health care provider about which screenings and vaccines you need and how often you need them. This information is not intended to replace advice given to you by your health care provider. Make sure you discuss any questions you have with your health care provider. Document Released: 07/18/2015 Document Revised: 03/10/2016 Document Reviewed: 04/22/2015 Elsevier Interactive Patient Education  2017 [place] Prevention in the Home Falls can cause injuries. They can happen to people of all ages. There are many things you can do to make your home safe and to help prevent falls. What can I do on the outside of my home?  Regularly fix the edges of walkways and driveways and fix any cracks.  Remove anything that might make you trip as you walk through a door, such as a raised step or threshold.  Trim any bushes or trees on the path to your home.  Use bright outdoor lighting.  Clear any walking paths of anything that might make someone trip, such as rocks or tools.  Regularly check to see if handrails are loose or broken. Make sure that both sides of any steps have handrails.  Any raised decks and porches should have guardrails on the edges.  Have any leaves, snow, or ice cleared regularly.  Use sand or salt on walking paths during winter.  Clean up any spills in your garage right away. This includes oil or grease spills. What can I do in the bathroom?  Use night lights.  Install grab bars by the toilet and in the tub and shower. Do not use towel bars as grab bars.  Use non-skid mats or decals in the tub or shower.  If you need to sit down in the shower, use a plastic, non-slip stool.  Keep the floor dry. Clean up any water that spills on the floor as soon as it happens.  Remove soap buildup in the tub or shower regularly.  Attach bath mats securely with double-sided  non-slip rug tape.  Do not have throw rugs and other things on the floor that can make you trip. What can I do in the bedroom?  Use night lights.  Make sure that you have a light by your bed that is easy to reach.  Do not use any sheets or blankets that are too big for your bed. They should not hang down onto the floor.  Have a firm chair that has side arms. You can use this for support while you get dressed.  Do not have throw rugs and other things on the floor that can make you trip. What can I do in the kitchen?  Clean up any spills right away.  Avoid walking on wet floors.  Keep items that you use a lot in easy-to-reach places.  If you need to reach something above you, use a strong step stool that has a grab bar.  Keep electrical cords out of the way.  Do not use floor polish or wax that makes floors slippery. If you must use wax, use non-skid floor wax.  Do not have throw rugs and other things on the floor that can make you trip. What can I do with my stairs?  Do  not leave any items on the stairs.  Make sure that there are handrails on both sides of the stairs and use them. Fix handrails that are broken or loose. Make sure that handrails are as long as the stairways.  Check any carpeting to make sure that it is firmly attached to the stairs. Fix any carpet that is loose or worn.  Avoid having throw rugs at the top or bottom of the stairs. If you do have throw rugs, attach them to the floor with carpet tape.  Make sure that you have a light switch at the top of the stairs and the bottom of the stairs. If you do not have them, ask someone to add them for you. What else can I do to help prevent falls?  Wear shoes that:  Do not have high heels.  Have rubber bottoms.  Are comfortable and fit you well.  Are closed at the toe. Do not wear sandals.  If you use a stepladder:  Make sure that it is fully opened. Do not climb a closed stepladder.  Make sure that both  sides of the stepladder are locked into place.  Ask someone to hold it for you, if possible.  Clearly mark and make sure that you can see:  Any grab bars or handrails.  First and last steps.  Where the edge of each step is.  Use tools that help you move around (mobility aids) if they are needed. These include:  Canes.  Walkers.  Scooters.  Crutches.  Turn on the lights when you go into a dark area. Replace any light bulbs as soon as they burn out.  Set up your furniture so you have a clear path. Avoid moving your furniture around.  If any of your floors are uneven, fix them.  If there are any pets around you, be aware of where they are.  Review your medicines with your doctor. Some medicines can make you feel dizzy. This can increase your chance of falling. Ask your doctor what other things that you can do to help prevent falls. This information is not intended to replace advice given to you by your health care provider. Make sure you discuss any questions you have with your health care provider. Document Released: 04/17/2009 Document Revised: 11/27/2015 Document Reviewed: 07/26/2014 Elsevier Interactive Patient Education  2017 [employer].

## 2019-08-30 LAB — HEMOGLOBIN A1C
Hgb A1c MFr Bld: 5.9 % of total Hgb — ABNORMAL HIGH (ref <5.7)
Mean Plasma Glucose: 123 (calc)
eAG (mmol/L): 6.8 (calc)

## 2019-09-04 ENCOUNTER — Ambulatory Visit (INDEPENDENT_AMBULATORY_CARE_PROVIDER_SITE_OTHER): Payer: Medicare Other | Admitting: Family Medicine

## 2019-09-04 ENCOUNTER — Encounter: Payer: Self-pay | Admitting: Family Medicine

## 2019-09-04 ENCOUNTER — Other Ambulatory Visit: Payer: Self-pay

## 2019-09-04 VITALS — BP 140/90 | HR 84 | Temp 96.3°F | Ht 69.0 in | Wt 189.5 lb

## 2019-09-04 DIAGNOSIS — E785 Hyperlipidemia, unspecified: Secondary | ICD-10-CM | POA: Diagnosis not present

## 2019-09-04 DIAGNOSIS — I10 Essential (primary) hypertension: Secondary | ICD-10-CM

## 2019-09-04 DIAGNOSIS — G931 Anoxic brain damage, not elsewhere classified: Secondary | ICD-10-CM | POA: Diagnosis not present

## 2019-09-04 DIAGNOSIS — E039 Hypothyroidism, unspecified: Secondary | ICD-10-CM | POA: Diagnosis not present

## 2019-09-04 DIAGNOSIS — Z7189 Other specified counseling: Secondary | ICD-10-CM

## 2019-09-04 DIAGNOSIS — H9319 Tinnitus, unspecified ear: Secondary | ICD-10-CM

## 2019-09-04 DIAGNOSIS — F988 Other specified behavioral and emotional disorders with onset usually occurring in childhood and adolescence: Secondary | ICD-10-CM | POA: Diagnosis not present

## 2019-09-04 DIAGNOSIS — Z0000 Encounter for general adult medical examination without abnormal findings: Secondary | ICD-10-CM

## 2019-09-04 MED ORDER — LEVOTHYROXINE SODIUM 112 MCG PO TABS: 112.0000 ug | ORAL_TABLET | Freq: Every day | ORAL | 3 refills | Status: DC

## 2019-09-04 MED ORDER — MONTELUKAST SODIUM 10 MG PO TABS: 10.0000 mg | ORAL_TABLET | Freq: Every day | ORAL | 3 refills | Status: DC

## 2019-09-04 NOTE — Progress Notes (Signed)
This visit occurred during the SARS-CoV-2 public health emergency.  Safety protocols were in place, including screening questions prior to the visit, additional usage of staff PPE, and extensive cleaning of exam room while observing appropriate contact time as indicated for disinfecting solutions.  Hypertension:  Using medication without problems or lightheadedness:  yes Chest pain with exertion:no Edema:no Short of breath:no Labs d/w pt.    Elevated Cholesterol: Using medications without problems: yes Muscle aches: no Diet compliance: encouraged.   Exercise: encouraged.    Hypothyroidism.  No dysphagia. Compliant.  TSH minimally elevated.  D/w pt.    Memory d/w pt.  Still seeing neurology.  Recent memory testing noted, wnl, but some days are better than others.  He still likely benefits from adderall and it makes sense to continue as is, d/w pt.  He agrees.    Flu 2020 PNA up to date.   Tetanus 2010 shingrix d/w pt.  covid vaccine, pfizer x2 07/31/2019, 08/21/19.   PSA wnl, d/w pt.   Colonoscopy 2017 Advance directive d/w pt. wife designated if patient were incapacitated  He hears "crickets" when the room is quiet, in B ears.  No ear pain.  He can still hear whispered voice.   PMH and SH reviewed  Meds, vitals, and allergies reviewed.   ROS: Per HPI unless specifically indicated in ROS section   GEN: nad, alert and oriented HEENT: ncat, TM wnl B NECK: supple w/o LA CV: rrr. PULM: ctab, no inc wob ABD: soft, +bs EXT: no edema SKIN: no acute rash

## 2019-09-04 NOTE — Patient Instructions (Addendum)
Check with your insurance to see if they will cover the shingrix and tetanus shots.  Recheck TSH in about 2 months at a nonfasting lab appointment.  Take care.  Glad to see you. Update me as needed.

## 2019-09-05 NOTE — Progress Notes (Signed)
I have reviewed and agreed above plan. 

## 2019-09-09 DIAGNOSIS — H9319 Tinnitus, unspecified ear: Secondary | ICD-10-CM | POA: Insufficient documentation

## 2019-09-09 NOTE — Assessment & Plan Note (Signed)
Flu 2020 PNA up to date.   Tetanus 2010 shingrix d/w pt.  covid vaccine, pfizer x2 07/31/2019, 08/21/19.   PSA wnl, d/w pt.   Colonoscopy 2017 Advance directive d/w pt. wife designated if patient were incapacitated

## 2019-09-09 NOTE — Assessment & Plan Note (Signed)
Advance directive d/w pt. wife designated if patient were incapacitated 

## 2019-09-09 NOTE — Assessment & Plan Note (Signed)
  He hears "crickets" when the room is quiet, in B ears.  No ear pain.  He can still hear whispered voice.  He doesn't need amplification at this point.  We can refer to ENT if needed or desired but I do not think it would change management at this point.  He agrees.

## 2019-09-09 NOTE — Assessment & Plan Note (Signed)
No dysphagia. Compliant.  TSH minimally elevated.  D/w pt. he can increase levothyroxine to 112 mcg a day and recheck TSH in a few months.  He agrees.  No thyromegaly on exam.

## 2019-09-09 NOTE — Assessment & Plan Note (Signed)
Recheck blood pressure reasonable.  No change in meds at this point.  He will update me as needed. At least 30 minutes were devoted to patient care in this encounter (this can potentially include time spent reviewing the patient's file/history, interviewing and examining the patient, counseling/reviewing plan with patient, ordering referrals, ordering tests, reviewing relevant laboratory or x-ray data, and documenting the encounter).

## 2019-09-09 NOTE — Assessment & Plan Note (Signed)
Continue as is.  He agrees.  Labs discussed with patient.

## 2019-09-09 NOTE — Assessment & Plan Note (Signed)
Still seeing neurology.  Recent memory testing noted, wnl, but some days are better than others.  He still likely benefits from adderall and it makes sense to continue as is, d/w pt.  He agrees.

## 2019-09-26 ENCOUNTER — Telehealth: Payer: Self-pay

## 2019-09-26 DIAGNOSIS — H9319 Tinnitus, unspecified ear: Secondary | ICD-10-CM

## 2019-09-26 NOTE — Telephone Encounter (Signed)
Pt left v/m; pt had visit for annual on 09/04/2019 and tinnitus was discussed. Pt said ringing in ears is getting louder and pt is afraid will affect his hearing. Pt request ENT referral discussed at 09/04/19 visit. Dr Damita Dunnings out of office and will send note to Dr Danise Mina.

## 2019-09-26 NOTE — Telephone Encounter (Signed)
referral placed

## 2019-10-02 ENCOUNTER — Other Ambulatory Visit: Payer: Self-pay | Admitting: Otolaryngology

## 2019-10-02 DIAGNOSIS — H9313 Tinnitus, bilateral: Secondary | ICD-10-CM | POA: Diagnosis not present

## 2019-10-02 DIAGNOSIS — H6123 Impacted cerumen, bilateral: Secondary | ICD-10-CM | POA: Diagnosis not present

## 2019-10-02 DIAGNOSIS — H9042 Sensorineural hearing loss, unilateral, left ear, with unrestricted hearing on the contralateral side: Secondary | ICD-10-CM

## 2019-10-08 ENCOUNTER — Ambulatory Visit (INDEPENDENT_AMBULATORY_CARE_PROVIDER_SITE_OTHER): Payer: Medicare Other | Admitting: *Deleted

## 2019-10-08 DIAGNOSIS — I4729 Other ventricular tachycardia: Secondary | ICD-10-CM

## 2019-10-08 DIAGNOSIS — I472 Ventricular tachycardia: Secondary | ICD-10-CM

## 2019-10-08 LAB — CUP PACEART REMOTE DEVICE CHECK
Battery Remaining Longevity: 48 mo
Battery Remaining Percentage: 52 %
Brady Statistic RV Percent Paced: 0 %
Date Time Interrogation Session: 20210405032100
HighPow Impedance: 70 Ohm
Implantable Lead Implant Date: 19990204
Implantable Lead Location: 753860
Implantable Lead Model: 135
Implantable Lead Serial Number: 301213
Implantable Pulse Generator Implant Date: 20111228
Lead Channel Impedance Value: 767 Ohm
Lead Channel Pacing Threshold Amplitude: 1.4 V
Lead Channel Pacing Threshold Pulse Width: 1 ms
Lead Channel Setting Pacing Amplitude: 2.4 V
Lead Channel Setting Pacing Pulse Width: 1 ms
Lead Channel Setting Sensing Sensitivity: 0.4 mV
Pulse Gen Serial Number: 277857

## 2019-10-09 NOTE — Progress Notes (Signed)
ICD Remote  

## 2019-10-17 ENCOUNTER — Other Ambulatory Visit: Payer: Self-pay

## 2019-10-17 ENCOUNTER — Ambulatory Visit
Admission: RE | Admit: 2019-10-17 | Discharge: 2019-10-17 | Disposition: A | Discharge status: Home / Self Care | Payer: Medicare Other | Source: Ambulatory Visit | Attending: Otolaryngology | Admitting: Otolaryngology

## 2019-10-17 DIAGNOSIS — H9042 Sensorineural hearing loss, unilateral, left ear, with unrestricted hearing on the contralateral side: Secondary | ICD-10-CM

## 2019-10-17 LAB — POCT I-STAT CREATININE: Creatinine, Ser: 1 mg/dL (ref 0.61–1.24)

## 2019-10-17 MED ORDER — IOHEXOL 300 MG/ML  SOLN
75.0000 mL | Freq: Once | INTRAMUSCULAR | Status: AC | PRN
  Administered 2019-10-17: 75 mL via INTRAVENOUS

## 2019-11-05 DIAGNOSIS — L820 Inflamed seborrheic keratosis: Secondary | ICD-10-CM | POA: Diagnosis not present

## 2019-11-05 DIAGNOSIS — Z85820 Personal history of malignant melanoma of skin: Secondary | ICD-10-CM | POA: Diagnosis not present

## 2019-11-05 DIAGNOSIS — D225 Melanocytic nevi of trunk: Secondary | ICD-10-CM | POA: Diagnosis not present

## 2019-11-05 DIAGNOSIS — L570 Actinic keratosis: Secondary | ICD-10-CM | POA: Diagnosis not present

## 2019-11-05 DIAGNOSIS — Z1283 Encounter for screening for malignant neoplasm of skin: Secondary | ICD-10-CM | POA: Diagnosis not present

## 2019-11-05 DIAGNOSIS — Z08 Encounter for follow-up examination after completed treatment for malignant neoplasm: Secondary | ICD-10-CM | POA: Diagnosis not present

## 2019-11-12 ENCOUNTER — Other Ambulatory Visit: Payer: Self-pay

## 2019-11-12 NOTE — Telephone Encounter (Signed)
Patient contacted the office stating he needed a refill on Adderall. This was last refilled on 07/29/19 for #180 with 0 refills. Patient was last seen 09/04/19 and has no upcoming appts.  He would like this sent in to [facility].

## 2019-11-13 MED ORDER — AMPHETAMINE-DEXTROAMPHETAMINE 10 MG PO TABS: 10.0000 mg | ORAL_TABLET | Freq: Two times a day (BID) | ORAL | 0 refills | Status: DC

## 2019-11-13 NOTE — Telephone Encounter (Signed)
Sent. Thanks.   

## 2019-11-15 ENCOUNTER — Telehealth: Payer: Self-pay | Admitting: Family Medicine

## 2019-11-15 MED ORDER — AMPHETAMINE-DEXTROAMPHETAMINE 10 MG PO TABS: 10.0000 mg | ORAL_TABLET | Freq: Two times a day (BID) | ORAL | 0 refills | Status: DC

## 2019-11-15 NOTE — Addendum Note (Signed)
Addended by: [name] on: 11/15/2019 05:29 PM   Modules accepted: Orders

## 2019-11-15 NOTE — Telephone Encounter (Signed)
Patient called about Adderall refill He stated that [facility] will not have his medication to him until Tuesday. Patient said he is going out of town on Saturday for a week long trip and will not have any when he leaves.  Patient wanted to know if a weeks worth of medication could be sent to a local pharmacy for him to pick up. Patient stated he would like to pick this up today if possible.    Please advise     [facility] [place].

## 2019-11-15 NOTE — Telephone Encounter (Signed)
error 

## 2019-11-15 NOTE — Telephone Encounter (Signed)
I sent 1 month supply locally.  Thanks.

## 2019-12-11 ENCOUNTER — Encounter: Payer: Self-pay | Admitting: Neurology

## 2019-12-11 ENCOUNTER — Other Ambulatory Visit: Payer: Self-pay

## 2019-12-11 ENCOUNTER — Ambulatory Visit: Payer: Medicare Other | Admitting: Neurology

## 2019-12-11 VITALS — BP 132/80 | HR 76 | Ht 69.0 in | Wt 184.0 lb

## 2019-12-11 DIAGNOSIS — R413 Other amnesia: Secondary | ICD-10-CM

## 2019-12-11 DIAGNOSIS — R569 Unspecified convulsions: Secondary | ICD-10-CM | POA: Diagnosis not present

## 2019-12-11 MED ORDER — MEMANTINE HCL 10 MG PO TABS: 10.0000 mg | ORAL_TABLET | Freq: Two times a day (BID) | ORAL | 4 refills | Status: DC

## 2019-12-11 MED ORDER — LAMOTRIGINE 100 MG PO TABS: 100.0000 mg | ORAL_TABLET | Freq: Two times a day (BID) | ORAL | 4 refills | Status: DC

## 2019-12-11 MED ORDER — DONEPEZIL HCL 10 MG PO TABS: 10.0000 mg | ORAL_TABLET | Freq: Every day | ORAL | 4 refills | Status: DC

## 2019-12-11 NOTE — Patient Instructions (Signed)
Continue current medications  See you back in 6 months  Memory test was stable 28/30

## 2019-12-11 NOTE — Progress Notes (Signed)
PATIENT: Larry [name] DOB: Mar 26, 1951  REASON FOR VISIT: follow up HISTORY FROM: patient  HISTORY OF PRESENT ILLNESS: Today 12/11/19  HISTORY   HISTORY OF PRESENT ILLNESS:[name] is a very friendly 69 year old right-handed gentleman presents for followup consultation of his memory loss in the context of anoxic brain injury.   He is accompanied by his wife today. He was a patient of Dr. 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  EEG showed diffuse slowing in January 1999, CT head and brain MRI were normal. CT head repeat in November 2000 showed slight prominence of the lateral and third ventricles. CT head in July 2003 showed prominence of the ventricular system. Psychological testing and August 1999 showed borderline performance IQ. He became depressed. In January 2014 his MMSE was 29, clock drawing was 4, animal fluency was 10. He went to [facility] for 2 years for patient with degenerative disorder.  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 He lives at home with his wife, still driving. He helps the household, he still has difficulty focusing.   He has had some fluctuation in his memory per wife. He has ADD and continues to take Adderall, 10 mg twice a day, without Adderall, he could not remember the pages that he has read,he is also taking Aricept 10 mg every day, which has been very helpful too.  Up date April 14 2016YY  His overall doing very well, he likes play computer games, which has helped his eye and hands coordination, he had recent trip to [place],  felt overwhelming, he continued to drive short distance,  He is taking Ritalin 10 mg twice a day, Aricept 10 mg once a day, complains of insomnia, he took his last dose of Ritalin at evening time.  UPDATE Apr 21 2015:YY He is now taking Namenda 10mg  bid and aricept 10mg  qday, he still has trouble sleeping, He watch movie, stay up late, he continues to have focus difficulty, tends to become agitated.  UPDATE April 27th 2017: YY He still has chronic insomnia, tends to have irregular sleep pattern, stay up late watching TV, he still very active during the day, take Adderall 10 mg twice a day, still drives short distance, mild unsteady gait, tends to hold his left arm to his chest  UPDATE Oct 17th 2017:YY He is with his wife, Her daughter was hospital, presented with sudden loss of consciousness yesterday April 19 2016, which has brought back a lot of his memory, he has not tried clonazepamfor chronic insomnia, worry about side effect, tried melatonin, still has significant insomnia, he is taking Tylenol pm,   UPDATE March 02 2018: He was follow-up by Chrys Racer in 2018, on December 19, 2017, visiting his daughter at [place], in the morning time, he had sudden onset dizziness, feels like going to pass out, then had transient loss of consciousness, eyes rolled back, staring, lasting for 1 minutes, followed by postevent mild confusion and slurred speech last about 40 minutes, was treated at local hospital.  I personally reviewed CT head without contrast: There was no acute abnormality, generalized atrophy, ventriculomegaly, laboratory evaluation, EKG  showed no significant abnormality.  Similar spells in March 2019, transient dizziness followed by sudden loss of muscle tone, consciousness, extreme fatigue, slept for 1 hour afterwards,  Patient reported that he actually has frequent spells about once a month, he has transient confusion, feels like he is going to pass out, short  lasting,  UPDATE Jun 05 2018: He tolerated lamotrigine very well, taking 100 mg twice a day without significant side effect,  EEG wasnormal. Since lamotrigine, he no longer has recurrent spells of rising sensation transient confusion even though there was no clinical seizure activity observed during those spells, he had those spells about once a week prior to lamotrigine,  Update 12/05/2018 SS:69 year old male with history of anoxic brain injury in 1999, mild cognitive impairment on Aricept and Namenda,and seizure, most recent seizure was in June 2019.  No change in memory, continued trouble with short-term memory, forgets what he goes into the room to get. No recurrent seizure, in February 2020, did get a different manufacturer of his lamotrigine, had an episode where he felt like he was going to pass out, but he did not. Hecalled the pharmacy, got the medication switched back to his regular manufacturer. Has not had any problems since then.He continues taking lamotrigine 100 mg twice daily, is tolerating medication well. He is driving a car, without difficulty, his wife is in the car when he drives. No episodes of getting lost. His wife manages their finances. 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 He manages his own medications.  Update June 12, 2019 SS: He reports he has continued to do well, does notice a decrease in his short-term memory, may lose his train of thought.  He drives a car well, but his wife often drives when they are together.  He has not had any accidents and has not gotten lost.  His wife will set out his medications every day for him to take.  She handles the finances.  He remains active, enjoys yard work.  He has not had recurrent seizure.  He has not had any falls.  Overall, is doing well.  He remains on Aricept, Namenda, and Lamictal.  Update December 11, 2019 SS: Feel memory is overall stable.   Remains on Aricept, Namenda, Lamictal, tolerating well, his wife lays out his medications daily.  Last seizure in June 2019.  Has trouble retaining new information, "like it doesn't stick".  Continues to be quite active, works in the yard, sings in the choir, drives, plays cards. MMSE 28/30 today. Labs checked at last visit, Lamictal level was 4.7, CBC, CMP normal, glucose 117.  REVIEW OF SYSTEMS: Out of a complete 14 system review of symptoms, the patient complains only of the following symptoms, and all other reviewed systems are negative.  Memory loss, seizures  ALLERGIES: Allergies  Allergen Reactions  . Penicillins     Difficult Breathing, rash    HOME MEDICATIONS: Outpatient Medications Prior to Visit  Medication Sig Dispense Refill  . amphetamine-dextroamphetamine (ADDERALL) 10 MG tablet Take 1 tablet (10 mg total) by mouth 2 (two) times daily. 60 tablet 0  . Ascorbic Acid (VITAMIN C) 500 MG tablet Take 500 mg by mouth daily.      Marland Kitchen aspirin 81 MG tablet Take 81 mg by mouth daily.    Marland Kitchen diltiazem (CARDIZEM CD) 120 MG 24 hr capsule TAKE 1 CAPSULE DAILY. 90 capsule 3  . donepezil (ARICEPT) 10 MG tablet Take 1 tablet (  10 mg total) by mouth daily. 90 tablet 1  . fexofenadine (ALLEGRA) 180 MG tablet Take 180 mg by mouth daily.      . Ginkgo Biloba 40 MG TABS Take by mouth as directed.    . lamoTRIgine (LAMICTAL) 100 MG tablet Take 1 tablet (100 mg total) by mouth 2 (two) times daily. 180 tablet 1  . levothyroxine (SYNTHROID) 112 MCG tablet Take 1 tablet (112 mcg total) by mouth daily before breakfast. 90 tablet 3  . Melatonin 5 MG CAPS Take by mouth as directed.    . memantine (NAMENDA) 10 MG tablet Take 1 tablet (10 mg total) by mouth 2 (two) times daily. 180 tablet 1  . metoprolol succinate (TOPROL XL) 25 MG 24 hr tablet Take 1 tablet (25 mg total) by mouth daily. 90 tablet 3  . montelukast (SINGULAIR) 10 MG tablet Take 1 tablet (10 mg total) by mouth daily. 90 tablet 3  . Multiple  Vitamin (MULTIVITAMIN) tablet Take 1 tablet by mouth daily.      . NON FORMULARY Focus factor 1 tab po bid     . rosuvastatin (CRESTOR) 20 MG tablet Take 1 tablet (20 mg total) by mouth at bedtime. 90 tablet 3   No facility-administered medications prior to visit.    PAST MEDICAL HISTORY: Past Medical History:  Diagnosis Date  . ADD (attention deficit disorder)   . Allergy    seasonal flares of perinneal allergies  . Cancer ([place])    melanoma  . Cardiac arrest ([place]) 07/1997   aborted  . Dementia ([place])    post resusitative  . Depression   . Diverticulosis   . Family hx of prostate cancer   . GERD (gastroesophageal reflux disease)   . Headache(784.0)   . Hyperlipidemia   . Hypothyroidism   . ICD (implantable cardiac defibrillator) in place   . Idiopathic urticaria   . Internal hemorrhoids   . Low back pain syndrome   . Melanoma in situ of skin of trunk ([facility])    chest  . PVC (premature ventricular contraction)    associated with cardiac arrest  . Sleep apnea    no c-pap  . Tubular adenoma of colon 10/2010    PAST SURGICAL HISTORY: Past Surgical History:  Procedure Laterality Date  .  implantation x2    . CARDIAC DEFIBRILLATOR PLACEMENT  07/01/2010   Explanation of a previously implanted device, pocket revision, and insertion of a new device, and intraoperative defibrillation threshold testing Caryl Comes)  . CARDIAC DEFIBRILLATOR REMOVAL  2004   Defibrillator change Caryl Comes) 3 times changed  . HEMORRHOID SURGERY    . ICD    . TONSILLECTOMY      FAMILY HISTORY: Family History  Problem Relation Age of Onset  . Hypertension Mother   . Heart disease Father   . Diverticulosis Father   . Coronary artery disease Father   . Alopecia Father   . Prostate cancer Brother   . Lung cancer Paternal Grandfather        Smoker  . Cancer Paternal Grandfather        lung cancer - smoker  . Diabetes Brother   . Heart disease Brother        heart transplant  . Colon cancer Neg Hx      SOCIAL HISTORY: Social History   Socioeconomic History  . Marital status: Married    Spouse name: [name]  . Number of children: 2  . Years of education: College  . Highest education  level: Not on file  Occupational History    Employer: DISABLED  Tobacco Use  . Smoking status: Former Smoker    Packs/day: 1.00    Years: 10.00    Pack years: 10.00    Types: Cigarettes    Quit date: 07/06/1995    Years since quitting: 24.4  . Smokeless tobacco: Former Systems developer    Types: [place] date: 11/04/1994  Substance and Sexual Activity  . Alcohol use: No    Alcohol/week: 0.0 standard drinks    Comment: socially  . Drug use: No  . Sexual activity: Not on file  Other Topics Concern  . Not on file  Social History Narrative   Patient is married [name]) [phone] with 2 daughters   5 grandchildren   Daily caffeine use - one cup of coffee and one-two cokes per day   Patient is right-handed.   Patient has a college education   Social Determinants of Radio broadcast assistant Strain: Low Risk   . Difficulty of Paying Living Expenses: Not hard at all  Food Insecurity: No Food Insecurity  . Worried About Charity fundraiser in the Last Year: Never true  . Ran Out of Food in the Last Year: Never true  Transportation Needs: No Transportation Needs  . Lack of Transportation (Medical): No  . Lack of Transportation (Non-Medical): No  Physical Activity: Inactive  . Days of Exercise per Week: 0 days  . Minutes of Exercise per Session: 0 min  Stress: No Stress Concern Present  . Feeling of Stress : Not at all  Social Connections:   . Frequency of Communication with Friends and Family:   . Frequency of Social Gatherings with Friends and Family:   . Attends Religious Services:   . Active Member of Clubs or Organizations:   . Attends Archivist Meetings:   Marland Kitchen Marital Status:   Intimate Partner Violence: Not At Risk  . Fear of Current or Ex-Partner: No  . Emotionally Abused: No  .  Physically Abused: No  . Sexually Abused: No   PHYSICAL EXAM  Vitals:   12/11/19 0956  BP: 132/80  Pulse: 76  Weight: 184 lb (83.5 kg)  Height: 5\' 9"  (1.753 m)   Body mass index is 27.17 kg/m.  Generalized: Well developed, in no acute distress  MMSE - Mini Mental State Exam 12/11/2019 08/29/2019 06/12/2019  Orientation to time 5 5 5   Orientation to Place 5 5 5   Registration 3 3 3   Attention/ Calculation 5 5 1   Recall 1 3 3   Recall-comments - - -  Language- name 2 objects 2 - 2  Language- repeat 1 1 1   Language- follow 3 step command 3 - 3  Language- read & follow direction 1 - 1  Write a sentence 1 - 1  Copy design 1 - 1  Copy design-comments 12 animals - named 15 animals  Total score 28 - 26    Neurological examination  Mentation: Alert oriented to time, place, history taking. Follows all commands speech and language fluent Cranial nerve II-XII: Pupils were equal round reactive to light. Extraocular movements were full, visual field were full on confrontational test. Facial sensation and strength were normal. Head turning and shoulder shrug  were normal and symmetric. Motor: The motor testing reveals 5 over 5 strength of all 4 extremities. Good symmetric motor tone is noted throughout.  Sensory: Sensory testing is intact to soft touch on all 4 extremities. No evidence  of extinction is noted.  Coordination: Cerebellar testing reveals good finger-nose-finger and heel-to-shin bilaterally.  Gait and station: Gait is normal.  Reflexes: Deep tendon reflexes are symmetric and normal bilaterally.   DIAGNOSTIC DATA (LABS, IMAGING, TESTING) - I reviewed patient records, labs, notes, testing and imaging myself where available.  Lab Results  Component Value Date   WBC 5.4 06/12/2019   HGB 14.6 06/12/2019   HCT 41.5 06/12/2019   MCV 91 06/12/2019   PLT 310 06/12/2019      Component Value Date/Time   NA 140 06/12/2019 1015   K 4.0 06/12/2019 1015   CL 102 06/12/2019 1015    CO2 24 06/12/2019 1015   GLUCOSE 117 (H) 06/12/2019 1015   GLUCOSE 95 08/11/2018 0825   BUN 13 06/12/2019 1015   CREATININE 1.00 10/17/2019 1425   CREATININE 1.07 12/23/2017 1451   CALCIUM 9.2 06/12/2019 1015   PROT 6.6 06/12/2019 1015   ALBUMIN 4.4 06/12/2019 1015   AST 32 06/12/2019 1015   ALT 40 06/12/2019 1015   ALKPHOS 102 06/12/2019 1015   BILITOT 0.3 06/12/2019 1015   GFRNONAA 88 06/12/2019 1015   GFRAA 101 06/12/2019 1015   Lab Results  Component Value Date   CHOL 190 08/29/2019   HDL 48.80 08/29/2019   LDLCALC 106 (H) 08/29/2019   LDLDIRECT 127.6 03/05/2014   TRIG 176.0 (H) 08/29/2019   CHOLHDL 4 08/29/2019   Lab Results  Component Value Date   HGBA1C 5.9 (H) 08/29/2019   No results found for: [id number] Lab Results  Component Value Date   TSH 6.29 (H) 08/29/2019      ASSESSMENT AND PLAN 69 y.o. year old male  has a past medical history of ADD (attention deficit disorder), Allergy, Cancer ([place]), Cardiac arrest ([facility]) (07/1997), Dementia ([place]), Depression, Diverticulosis, Family hx of prostate cancer, GERD (gastroesophageal reflux disease), Headache(784.0), Hyperlipidemia, Hypothyroidism, ICD (implantable cardiac defibrillator) in place, Idiopathic urticaria, Internal hemorrhoids, Low back pain syndrome, Melanoma in situ of skin of trunk ([place]), PVC (premature ventricular contraction), Sleep apnea, and Tubular adenoma of colon (10/2010). here with:  1.  Anoxic brain injury 1999, cognitive impairment -MMSE 28/30 today -Continue Aricept 10 mg daily, Namenda 10 mg twice a day -Encouraged to continue consistent activity  2.  Seizure -Most recent in June 2019, EEG was normal -Continue lamotrigine 100 mg twice a day -Follow-up in 6 months or sooner if needed (patient preference)  I spent 30 minutes of face-to-face and non-face-to-face time with patient.  This included previsit chart review, lab review, study review, order entry, electronic health record documentation,  patient education.  Butler Denmark, AGNP-C, DNP 12/11/2019, 10:12 AM [facility] [address], [place], [place] [address] [phone]

## 2019-12-27 ENCOUNTER — Other Ambulatory Visit: Payer: Self-pay

## 2019-12-27 MED ORDER — DILTIAZEM HCL ER COATED BEADS 120 MG PO CP24: ORAL_CAPSULE | ORAL | 0 refills | Status: DC

## 2020-01-08 ENCOUNTER — Ambulatory Visit (INDEPENDENT_AMBULATORY_CARE_PROVIDER_SITE_OTHER): Payer: Medicare Other | Admitting: *Deleted

## 2020-01-08 DIAGNOSIS — I472 Ventricular tachycardia: Secondary | ICD-10-CM

## 2020-01-08 DIAGNOSIS — I4729 Other ventricular tachycardia: Secondary | ICD-10-CM

## 2020-01-08 LAB — CUP PACEART REMOTE DEVICE CHECK
Battery Remaining Longevity: 48 mo
Battery Remaining Percentage: 50 %
Brady Statistic RV Percent Paced: 0 %
Date Time Interrogation Session: 20210706032100
HighPow Impedance: 70 Ohm
Implantable Lead Implant Date: 19990204
Implantable Lead Location: 753860
Implantable Lead Model: 135
Implantable Lead Serial Number: 301213
Implantable Pulse Generator Implant Date: 20111228
Lead Channel Impedance Value: 786 Ohm
Lead Channel Pacing Threshold Amplitude: 1.4 V
Lead Channel Pacing Threshold Pulse Width: 1 ms
Lead Channel Setting Pacing Amplitude: 2.4 V
Lead Channel Setting Pacing Pulse Width: 1 ms
Lead Channel Setting Sensing Sensitivity: 0.4 mV
Pulse Gen Serial Number: 277857

## 2020-01-10 NOTE — Progress Notes (Signed)
Remote ICD transmission.   

## 2020-01-14 ENCOUNTER — Other Ambulatory Visit: Payer: Self-pay

## 2020-01-14 DIAGNOSIS — I10 Essential (primary) hypertension: Secondary | ICD-10-CM

## 2020-01-14 DIAGNOSIS — E7849 Other hyperlipidemia: Secondary | ICD-10-CM

## 2020-01-14 MED ORDER — ROSUVASTATIN CALCIUM 20 MG PO TABS: 20.0000 mg | ORAL_TABLET | Freq: Every day | ORAL | 0 refills | Status: DC

## 2020-01-30 ENCOUNTER — Other Ambulatory Visit: Payer: Self-pay | Admitting: Family Medicine

## 2020-01-30 MED ORDER — AMPHETAMINE-DEXTROAMPHETAMINE 10 MG PO TABS: 10.0000 mg | ORAL_TABLET | Freq: Two times a day (BID) | ORAL | 0 refills | Status: DC

## 2020-01-30 NOTE — Telephone Encounter (Signed)
Patient called in stating he is needing a medication refill on amphetamine-dextroamphetamine (ADDERALL) 10 MG tablet. Please advise.   [place], [place], [address] Phone:  [phone]  Fax:  [phone]

## 2020-01-30 NOTE — Telephone Encounter (Signed)
Sent. Thanks.  I sent this as a 90-day supply in the hopes that the mail order company would fill it as such.

## 2020-01-31 NOTE — Telephone Encounter (Signed)
Noted  

## 2020-02-20 ENCOUNTER — Ambulatory Visit: Payer: Medicare Other | Admitting: Physician Assistant

## 2020-02-20 ENCOUNTER — Other Ambulatory Visit: Payer: Self-pay

## 2020-02-20 ENCOUNTER — Encounter: Payer: Self-pay | Admitting: Physician Assistant

## 2020-02-20 VITALS — BP 130/90 | HR 68 | Ht 69.0 in | Wt 185.0 lb

## 2020-02-20 DIAGNOSIS — I10 Essential (primary) hypertension: Secondary | ICD-10-CM

## 2020-02-20 DIAGNOSIS — Z8674 Personal history of sudden cardiac arrest: Secondary | ICD-10-CM

## 2020-02-20 DIAGNOSIS — I4729 Other ventricular tachycardia: Secondary | ICD-10-CM

## 2020-02-20 DIAGNOSIS — Z95810 Presence of automatic (implantable) cardiac defibrillator: Secondary | ICD-10-CM | POA: Diagnosis not present

## 2020-02-20 DIAGNOSIS — I472 Ventricular tachycardia: Secondary | ICD-10-CM | POA: Diagnosis not present

## 2020-02-20 NOTE — Patient Instructions (Signed)

## 2020-02-20 NOTE — Progress Notes (Signed)
Cardiology Office Note    Date:  02/20/2020   ID:  Larry [name], Larry [name] Mar 27, 1951, MRN [id number]  PCP:  [name], MD  Cardiologist:  Sherren Mocha, MD  Electrophysiologist: Virl Axe, MD  Chief Complaint: 12 Months follow up  History of Present Illness:   Larry [name] is a 69 y.o. male with hx of cardiac arrest, s/p ICD, HTN and HLD presents for follow up.   Hx of cardiac arrest in 1999 with hypoxic brain injury- now improved. He underwent ICD implantation. He was found to have normal coronary arteries at the time of his initial event. He has had normal LV function by echo assessment.  He had syncope in 2019 but he was put on lamotrigine for possible seizures by his neurologist and he has had no further syncope.   Here today for follow up.  He denies chest pain, shortness of breath, orthopnea, PND, syncope, lower extremity edema or melena.  Compliant with his medication.  He is active doing yard work.  He is vaccinated for COVID-19 and waiting for turn on booster shot.  Past Medical History:  Diagnosis Date   ADD (attention deficit disorder)    Allergy    seasonal flares of perinneal allergies   Cancer ([place])    melanoma   Cardiac arrest ([place]) 07/1997   aborted   Dementia ([place])    post resusitative   Depression    Diverticulosis    Family hx of prostate cancer    GERD (gastroesophageal reflux disease)    Headache(784.0)    Hyperlipidemia    Hypothyroidism    ICD (implantable cardiac defibrillator) in place    Idiopathic urticaria    Internal hemorrhoids    Low back pain syndrome    Melanoma in situ of skin of trunk ([place])    chest   PVC (premature ventricular contraction)    associated with cardiac arrest   Sleep apnea    no c-pap   Tubular adenoma of colon 10/2010    Past Surgical History:  Procedure Laterality Date    implantation x2     CARDIAC DEFIBRILLATOR PLACEMENT  07/01/2010   Explanation of a previously  implanted device, pocket revision, and insertion of a new device, and intraoperative defibrillation threshold testing Caryl Comes)   CARDIAC DEFIBRILLATOR REMOVAL  2004   Defibrillator change Caryl Comes) 3 times changed   HEMORRHOID SURGERY     ICD     TONSILLECTOMY      Current Medications: Prior to Admission medications   Medication Sig Start Date End Date Taking? Authorizing Provider  amphetamine-dextroamphetamine (ADDERALL) 10 MG tablet Take 1 tablet (10 mg total) by mouth 2 (two) times daily. 01/30/20   [name], MD  Ascorbic Acid (VITAMIN C) 500 MG tablet Take 500 mg by mouth daily.      [provider]  aspirin 81 MG tablet Take 81 mg by mouth daily.    [provider]  diltiazem (CARDIZEM CD) 120 MG 24 hr capsule TAKE 1 CAPSULE BY MOUTH DAILY. Please make yearly appt with Dr. Burt Knack for September for future refills. 1st attempt 12/27/19   Sherren Mocha, MD  donepezil (ARICEPT) 10 MG tablet Take 1 tablet (10 mg total) by mouth daily. 12/11/19   Suzzanne Cloud, NP  fexofenadine (ALLEGRA) 180 MG tablet Take 180 mg by mouth daily.      [provider]  Ginkgo Biloba 40 MG TABS Take by mouth as directed.    [provider]  lamoTRIgine (LAMICTAL) 100 MG tablet Take 1 tablet (100 mg total) by mouth 2 (two) times daily. 12/11/19   Suzzanne Cloud, NP  levothyroxine (SYNTHROID) 112 MCG tablet Take 1 tablet (112 mcg total) by mouth daily before breakfast. 09/04/19   [name], MD  Melatonin 5 MG CAPS Take by mouth as directed.    [provider]  memantine (NAMENDA) 10 MG tablet Take 1 tablet (10 mg total) by mouth 2 (two) times daily. 12/11/19   Suzzanne Cloud, NP  metoprolol succinate (TOPROL XL) 25 MG 24 hr tablet Take 1 tablet (25 mg total) by mouth daily. 03/26/19   Daune Perch, NP  montelukast (SINGULAIR) 10 MG tablet Take 1 tablet (10 mg total) by mouth daily. 09/04/19   [name], MD  Multiple Vitamin (MULTIVITAMIN) tablet Take 1  tablet by mouth daily.      [provider]  NON FORMULARY Focus factor 1 tab po bid     [provider]  rosuvastatin (CRESTOR) 20 MG tablet Take 1 tablet (20 mg total) by mouth at bedtime. 01/14/20   Sherren Mocha, MD    Allergies:   Penicillins   Social History   Socioeconomic History   Marital status: Married    Spouse name: Larry [name]   Number of children: 2   Years of education: College   Highest education level: Not on file  Occupational History    Employer: DISABLED  Tobacco Use   Smoking status: Former Smoker    Packs/day: 1.00    Years: 10.00    Pack years: 10.00    Types: Cigarettes    Quit date: 07/06/1995    Years since quitting: 24.6   Smokeless tobacco: Former Systems developer    Types: Chew    Quit date: 11/04/1994  Vaping Use   Vaping Use: Never used  Substance and Sexual Activity   Alcohol use: No    Alcohol/week: 0.0 standard drinks    Comment: socially   Drug use: No   Sexual activity: Not on file  Other Topics Concern   Not on file  Social History Narrative   Patient is married Larry [name]) [phone] with 2 daughters   5 grandchildren   Daily caffeine use - one cup of coffee and one-two cokes per day   Patient is right-handed.   Patient has a college education   Social Determinants of Radio broadcast assistant Strain: Low Risk    Difficulty of Paying Living Expenses: Not hard at all  Food Insecurity: No Food Insecurity   Worried About Charity fundraiser in the Last Year: Never true   Arboriculturist in the Last Year: Never true  Transportation Needs: No Transportation Needs   Lack of Transportation (Medical): No   Lack of Transportation (Non-Medical): No  Physical Activity: Inactive   Days of Exercise per Week: 0 days   Minutes of Exercise per Session: 0 min  Stress: No Stress Concern Present   Feeling of Stress : Not at all  Social Connections:    Frequency of Communication with Friends and Family:    Frequency of Social  Gatherings with Friends and Family:    Attends Religious Services:    Active Member of Clubs or Organizations:    Attends Archivist Meetings:    Marital Status:      Family History:  The patient's family history includes Alopecia in his father; Cancer in his paternal grandfather; Coronary artery disease in  his father; Diabetes in his brother; Diverticulosis in his father; Heart disease in his brother and father; Hypertension in his mother; Lung cancer in his paternal grandfather; Prostate cancer in his brother.   ROS:   Please see the history of present illness.    ROS All other systems reviewed and are negative.   PHYSICAL EXAM:   VS:  BP 130/90    [name] 70    Ht 5\' 9"  (1.753 m)    Wt 185 lb (83.9 kg)    SpO2 95%    BMI 27.32 kg/m    GEN: Well nourished, well developed, in no acute distress  HEENT: normal  Neck: no JVD, carotid bruits, or masses Cardiac: RRR; no murmurs, rubs, or gallops,no edema  Respiratory:  clear to auscultation bilaterally, normal work of breathing GI: soft, nontender, nondistended, + BS MS: no deformity or atrophy  Skin: warm and dry, no rash Neuro:  Alert and Oriented x 3, Strength and sensation are intact Psych: euthymic mood, full affect  Wt Readings from Last 3 Encounters:  02/20/20 185 lb (83.9 kg)  12/11/19 184 lb (83.5 kg)  09/04/19 189 lb 8 oz (86 kg)      Studies/Labs Reviewed:   EKG:  EKG is ordered today.  The ekg ordered today demonstrates NSR, chronic TWI in leads V1 and V2  Recent Labs: 06/12/2019: ALT 40; BUN 13; Hemoglobin 14.6; Platelets 310; Potassium 4.0; Sodium 140 08/29/2019: TSH 6.29 10/17/2019: Creatinine, Ser 1.00   Lipid Panel    Component Value Date/Time   CHOL 190 08/29/2019 0834   TRIG 176.0 (H) 08/29/2019 0834   HDL 48.80 08/29/2019 0834   CHOLHDL 4 08/29/2019 0834   VLDL 35.2 08/29/2019 0834   LDLCALC 106 (H) 08/29/2019 0834   LDLDIRECT 127.6 03/05/2014 0841    Additional studies/ records that  were reviewed today include:   Echocardiogram: 08/2011 Study Conclusions   - Left ventricle: The cavity size was normal. Wall thickness  was normal. The estimated ejection fraction was 60%. Wall  motion was normal; there were no regional wall motion  abnormalities.  - Right ventricle: The cavity size was normal. Pacer wire or  catheter noted in right ventricle. Systolic function was  normal.       ASSESSMENT & PLAN:    1. Hx of remote cardiac arrest s/p ICD - followed by Dr. Caryl Comes.  No angina.  Continue aspirin, statin and beta-blocker.  2.  Hypertension -Blood pressure stable and well-controlled on current medications  3.  Hyperlipidemia - followed by PCP    Medication Adjustments/Labs and Tests Ordered: Current medicines are reviewed at length with the patient today.  Concerns regarding medicines are outlined above.  Medication changes, Labs and Tests ordered today are listed in the Patient Instructions below. Patient Instructions  Medication Instructions:  Your physician recommends that you continue on your current medications as directed. Please refer to the Current Medication list given to you today.  *If you need a refill on your cardiac medications before your next appointment, please call your pharmacy*   Lab Work: None ordered  If you have labs (blood work) drawn today and your tests are completely normal, you will receive your results only by:  [place] (if you have MyChart) OR  A paper copy in the mail If you have any lab test that is abnormal or we need to change your treatment, we will call you to review the results.   Testing/Procedures: None ordered   Follow-Up: At [facility], you and your health needs are our priority.  As part of our continuing mission to provide you with exceptional heart care, we have created designated Provider Care Teams.  These Care Teams include your primary Cardiologist (physician) and Advanced  Practice Providers (APPs -  Physician Assistants and Nurse Practitioners) who all work together to provide you with the care you need, when you need it.  We recommend signing up for the patient portal called "MyChart".  Sign up information is provided on this After Visit Summary.  MyChart is used to connect with patients for Virtual Visits (Telemedicine).  Patients are able to view lab/test results, encounter notes, upcoming appointments, etc.  Non-urgent messages can be sent to your provider as well.   To learn more about what you can do with MyChart, go to [email or web address].    Your next appointment:   12 month(s)  The format for your next appointment:   In Person  Provider:   You may see Sherren Mocha, MD or one of the following Advanced Practice Providers on your designated Care Team:    Richardson Dopp, PA-C  Robbie Lis, PA-C    Other Instructions      Jarrett Soho, [place]  02/20/2020 10:45 AM    [address], [place], [place]  [address] Phone: [phone]; Fax: [phone]

## 2020-02-27 ENCOUNTER — Other Ambulatory Visit: Payer: Self-pay | Admitting: Cardiovascular Disease

## 2020-03-16 ENCOUNTER — Other Ambulatory Visit: Payer: Self-pay | Admitting: Neurology

## 2020-03-16 ENCOUNTER — Other Ambulatory Visit: Payer: Self-pay | Admitting: Cardiovascular Disease

## 2020-03-16 DIAGNOSIS — R413 Other amnesia: Secondary | ICD-10-CM

## 2020-03-16 DIAGNOSIS — E7849 Other hyperlipidemia: Secondary | ICD-10-CM

## 2020-03-16 DIAGNOSIS — I10 Essential (primary) hypertension: Secondary | ICD-10-CM

## 2020-03-18 ENCOUNTER — Other Ambulatory Visit: Payer: Self-pay

## 2020-03-18 ENCOUNTER — Encounter: Payer: Self-pay | Admitting: Internal Medicine

## 2020-03-18 ENCOUNTER — Ambulatory Visit (INDEPENDENT_AMBULATORY_CARE_PROVIDER_SITE_OTHER): Payer: Medicare Other | Admitting: Internal Medicine

## 2020-03-18 VITALS — BP 120/82 | HR 95 | Ht 69.0 in | Wt 183.0 lb

## 2020-03-18 DIAGNOSIS — I472 Ventricular tachycardia: Secondary | ICD-10-CM

## 2020-03-18 DIAGNOSIS — Z95810 Presence of automatic (implantable) cardiac defibrillator: Secondary | ICD-10-CM

## 2020-03-18 DIAGNOSIS — I4729 Other ventricular tachycardia: Secondary | ICD-10-CM

## 2020-03-18 DIAGNOSIS — I10 Essential (primary) hypertension: Secondary | ICD-10-CM

## 2020-03-18 DIAGNOSIS — Z8674 Personal history of sudden cardiac arrest: Secondary | ICD-10-CM

## 2020-03-18 NOTE — Progress Notes (Signed)
Patient Care Team: [name], MD as PCP - General (Family Medicine) Deboraha Sprang, MD as PCP - Electrophysiology (Cardiology) Sherren Mocha, MD as PCP - Cardiology (Cardiology)   HPI  Larry [name] is a 69 y.o. male seen in followup for aborted cardiac arrest 1999 that occurred in the context of PVCs for which he was being treated with Propafenone. He underwent ICD generator replacement in Dec 2011 .  The patient denies chest pain, shortness of breath, nocturnal dyspnea, orthopnea or peripheral edema.  There have been no palpitations, lightheadedness or syncope.     Date Cr K TSH Hgb  12/20 1.0 4.0 6.29 14.6     Past Medical History:  Diagnosis Date  . ADD (attention deficit disorder)   . Allergy    seasonal flares of perinneal allergies  . Cancer ([place])    melanoma  . Cardiac arrest ([place]) 07/1997   aborted  . Dementia ([place])    post resusitative  . Depression   . Diverticulosis   . Family hx of prostate cancer   . GERD (gastroesophageal reflux disease)   . Headache(784.0)   . Hyperlipidemia   . Hypothyroidism   . ICD (implantable cardiac defibrillator) in place   . Idiopathic urticaria   . Internal hemorrhoids   . Low back pain syndrome   . Melanoma in situ of skin of trunk ([facility])    chest  . PVC (premature ventricular contraction)    associated with cardiac arrest  . Sleep apnea    no c-pap  . Tubular adenoma of colon 10/2010    Past Surgical History:  Procedure Laterality Date  .  implantation x2    . CARDIAC DEFIBRILLATOR PLACEMENT  07/01/2010   Explanation of a previously implanted device, pocket revision, and insertion of a new device, and intraoperative defibrillation threshold testing Caryl Comes)  . CARDIAC DEFIBRILLATOR REMOVAL  2004   Defibrillator change Caryl Comes) 3 times changed  . HEMORRHOID SURGERY    . ICD    . TONSILLECTOMY      Current Outpatient Medications  Medication Sig Dispense Refill  . amphetamine-dextroamphetamine (ADDERALL)  10 MG tablet Take 1 tablet (10 mg total) by mouth 2 (two) times daily. 180 tablet 0  . Ascorbic Acid (VITAMIN C) 500 MG tablet Take 500 mg by mouth daily.      Marland Kitchen aspirin 81 MG tablet Take 81 mg by mouth daily.    Marland Kitchen diltiazem (CARTIA XT) 120 MG 24 hr capsule TAKE 1 CAPSULE BY MOUTH  DAILY. 90 capsule 3  . donepezil (ARICEPT) 10 MG tablet Take 1 tablet (10 mg total) by mouth daily. 90 tablet 4  . fexofenadine (ALLEGRA) 180 MG tablet Take 180 mg by mouth daily.      . Ginkgo Biloba 40 MG TABS Take by mouth as directed.    . lamoTRIgine (LAMICTAL) 100 MG tablet Take 1 tablet (100 mg total) by mouth 2 (two) times daily. 180 tablet 4  . levothyroxine (SYNTHROID) 112 MCG tablet Take 1 tablet (112 mcg total) by mouth daily before breakfast. 90 tablet 3  . Melatonin 5 MG CAPS Take by mouth as directed.    . memantine (NAMENDA) 10 MG tablet Take 1 tablet (10 mg total) by mouth 2 (two) times daily. 180 tablet 4  . metoprolol succinate (TOPROL XL) 25 MG 24 hr tablet Take 1 tablet (25 mg total) by mouth daily. 90 tablet 3  . montelukast (SINGULAIR) 10 MG tablet Take 1 tablet (10 mg total) by  mouth daily. 90 tablet 3  . Multiple Vitamin (MULTIVITAMIN) tablet Take 1 tablet by mouth daily.      . NON FORMULARY Focus factor 1 tab po bid     . rosuvastatin (CRESTOR) 20 MG tablet TAKE 1 TABLET BY MOUTH AT  BEDTIME 90 tablet 0   No current facility-administered medications for this visit.    Allergies  Allergen Reactions  . Penicillins     Difficult Breathing, rash    Review of Systems negative except from HPI and PMH  Physical Exam BP 120/82 (BP Location: Left Arm, Patient Position: Sitting, Cuff Size: Normal)   Pulse 95   Ht 5\' 9"  (1.753 m)   Wt 183 lb (83 kg)   BMI 27.02 kg/m  Well developed and well nourished in no acute distress HENT normal Neck supple with JVP-flat Clear Device pocket well healed; without hematoma or erythema.  There is no tethering  Regular rate and rhythm, no    murmur Abd-soft with active BS No Clubbing cyanosis   edema Skin-warm and dry A & Oriented  Grossly normal sensory and motor function  ECG sinus @ 95 15/09/37 Inferior Q waves    Assessment and  Plan Aborted Cardiac Arrest  Hypertension

## 2020-03-18 NOTE — Patient Instructions (Signed)

## 2020-04-08 ENCOUNTER — Ambulatory Visit (INDEPENDENT_AMBULATORY_CARE_PROVIDER_SITE_OTHER): Payer: Medicare Other

## 2020-04-08 DIAGNOSIS — Z8674 Personal history of sudden cardiac arrest: Secondary | ICD-10-CM | POA: Diagnosis not present

## 2020-04-09 LAB — CUP PACEART REMOTE DEVICE CHECK
Battery Remaining Longevity: 42 mo
Battery Remaining Percentage: 47 %
Brady Statistic RV Percent Paced: 0 %
Date Time Interrogation Session: 20211005035600
HighPow Impedance: 64 Ohm
Implantable Lead Implant Date: 19990204
Implantable Lead Location: 753860
Implantable Lead Model: 135
Implantable Lead Serial Number: 301213
Implantable Pulse Generator Implant Date: 20111228
Lead Channel Impedance Value: 755 Ohm
Lead Channel Pacing Threshold Amplitude: 1.2 V
Lead Channel Pacing Threshold Pulse Width: 1 ms
Lead Channel Setting Pacing Amplitude: 2.4 V
Lead Channel Setting Pacing Pulse Width: 1 ms
Lead Channel Setting Sensing Sensitivity: 0.4 mV
Pulse Gen Serial Number: 277857

## 2020-04-11 NOTE — Progress Notes (Signed)
Remote ICD transmission.   

## 2020-04-12 ENCOUNTER — Other Ambulatory Visit: Payer: Self-pay

## 2020-04-12 ENCOUNTER — Ambulatory Visit (INDEPENDENT_AMBULATORY_CARE_PROVIDER_SITE_OTHER): Payer: Medicare Other

## 2020-04-12 DIAGNOSIS — Z23 Encounter for immunization: Secondary | ICD-10-CM | POA: Diagnosis not present

## 2020-04-23 ENCOUNTER — Other Ambulatory Visit: Payer: Self-pay | Admitting: Family Medicine

## 2020-04-23 MED ORDER — AMPHETAMINE-DEXTROAMPHETAMINE 10 MG PO TABS
10.0000 mg | ORAL_TABLET | Freq: Two times a day (BID) | ORAL | 0 refills | Status: DC
Start: 2020-04-23 — End: 2020-08-20

## 2020-04-23 NOTE — Addendum Note (Signed)
Addended by: [name] on: 04/23/2020 07:11 PM   Modules accepted: Orders

## 2020-04-23 NOTE — Telephone Encounter (Signed)
Last office visit 09/04/2019 CPE.  Last filled 01/30/20 #180 with no ef ills  No future appointments with PCP.

## 2020-04-23 NOTE — Telephone Encounter (Signed)
Pt called to get a refill on  adderall  He wanted to get refill before insurance goes back up in dec  [facility]  [phone] phone [phone] fax

## 2020-04-23 NOTE — Addendum Note (Signed)
Addended by: Carter Kitten on: 04/23/2020 05:43 PM   Modules accepted: Orders

## 2020-04-23 NOTE — Telephone Encounter (Signed)
Okay to continue.  Sent.  Thanks. 

## 2020-04-24 ENCOUNTER — Other Ambulatory Visit: Payer: Self-pay | Admitting: *Deleted

## 2020-04-24 MED ORDER — METOPROLOL SUCCINATE ER 25 MG PO TB24
25.0000 mg | ORAL_TABLET | Freq: Every day | ORAL | 3 refills | Status: DC
Start: 2020-04-24 — End: 2021-04-14

## 2020-05-21 ENCOUNTER — Other Ambulatory Visit: Payer: Self-pay | Admitting: Family Medicine

## 2020-05-21 DIAGNOSIS — E7849 Other hyperlipidemia: Secondary | ICD-10-CM

## 2020-05-21 DIAGNOSIS — I10 Essential (primary) hypertension: Secondary | ICD-10-CM

## 2020-05-21 NOTE — Telephone Encounter (Signed)
Pharmacy requests refill on: Rosuvastatin Calcium 20 mg  LAST REFILL: 03/16/2020 LAST OV: 09/04/2019 NEXT OV: Not Scheduled  PHARMACY: [place], [place]

## 2020-06-16 ENCOUNTER — Ambulatory Visit: Payer: Medicare Other | Admitting: Neurology

## 2020-06-19 ENCOUNTER — Other Ambulatory Visit: Payer: Self-pay

## 2020-06-19 ENCOUNTER — Ambulatory Visit (INDEPENDENT_AMBULATORY_CARE_PROVIDER_SITE_OTHER): Payer: Medicare Other | Admitting: Neurology

## 2020-06-19 ENCOUNTER — Encounter: Payer: Self-pay | Admitting: Neurology

## 2020-06-19 VITALS — BP 136/88 | HR 106 | Ht 69.0 in | Wt 181.0 lb

## 2020-06-19 DIAGNOSIS — G931 Anoxic brain damage, not elsewhere classified: Secondary | ICD-10-CM

## 2020-06-19 DIAGNOSIS — R569 Unspecified convulsions: Secondary | ICD-10-CM

## 2020-06-19 DIAGNOSIS — R413 Other amnesia: Secondary | ICD-10-CM | POA: Diagnosis not present

## 2020-06-19 NOTE — Progress Notes (Signed)
HISTORY OF PRESENT ILLNESS:[name] is a very friendly 69 year old right-handed gentleman presents for followup consultation of his memory loss in the context of anoxic brain injury.   He is accompanied by his wife today. He was a patient of Dr. 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  EEG showed diffuse slowing in January 1999, CT head and brain MRI were normal. CT head repeat in November 2000 showed slight prominence of the lateral and third ventricles. CT head in July 2003 showed prominence of the ventricular system. Psychological testing and August 1999 showed borderline performance IQ. He became depressed. In January 2014 his MMSE was 29, clock drawing was 4, animal fluency was 10. He went to [facility] for 2 years for patient with degenerative disorder.  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 He lives at home with his wife, still driving. He helps the household, he still has difficulty focusing.   He has had some fluctuation in his memory per wife. He has ADD and continues to take Adderall, 10 mg twice a day, without Adderall, he could not remember the pages that he has read,he is also taking Aricept 10 mg every day, which has been very helpful too.  Up date April 14 2016YY  His overall doing very well, he likes play computer games, which has helped his eye and hands coordination, he had recent trip to [place], felt overwhelming, he continued to drive short distance,  He is taking Ritalin 10 mg twice a day, Aricept 10 mg once a day, complains of insomnia, he took  his last dose of Ritalin at evening time.  UPDATE Apr 21 2015:YY He is now taking Namenda 52m bid and aricept 170mqday, he still has trouble sleeping, He watch movie, stay up late, he continues to have focus difficulty, tends to become agitated.  UPDATE April 27th 2017: YY He still has chronic insomnia, tends to have irregular sleep pattern, stay up late watching TV, he still very active during the day, take Adderall 10 mg twice a day, still drives short distance, mild unsteady gait, tends to hold his left arm to his chest  UPDATE Oct 17th 2017:YY He is with his wife, Her daughter was hospital, presented with sudden loss of consciousness yesterday April 19 2016, which has brought back a lot of his memory, he has not tried clonazepamfor chronic insomnia, worry about side effect, tried melatonin, still has significant insomnia, he is taking Tylenol pm,   UPDATE March 02 2018: He was follow-up by Ca[facility] Racern 2018, on December 19, 2017, [place] he had sudden onset dizziness, feels like going to pass out, then had transient loss of consciousness, eyes rolled back, staring, lasting for 1 minutes, followed by postevent mild confusion and slurred speech last about 40 minutes, was treated at local hospital.  I personally reviewed CT head without contrast: There was no acute abnormality, generalized atrophy, ventriculomegaly, laboratory evaluation, EKG showed no significant abnormality.  Similar spells in March 2019, transient dizziness followed by sudden loss of muscle tone, consciousness, extreme fatigue, slept for 1 hour  afterwards,  Patient reported that he actually has frequent spells about once a month, he has transient confusion, feels like he is going to pass out, short lasting,  UPDATE Jun 05 2018: He tolerated lamotrigine very well, taking 100 mg twice a day without significant side effect,  EEG wasnormal. Since lamotrigine, he no  longer has recurrent spells of rising sensation transient confusion even though there was no clinical seizure activity observed during those spells, he had those spells about once a week prior to lamotrigine,  UPDATE Jun 19 2020: Is accompanied by his wife at today's clinical visit, slow worsening memory loss, when he goes to a different room, he often forgets why is he there, slight worsening gait abnormality, he also complains of worsening tinnitus, difficulty sleeping sometimes,  I personally reviewed CT temporal bone w/wo in April 2021:  1. Negative CT appearance of the bilateral temporal bones. No explanation for hearing loss. 2. Note a mildly high riding right jugular bulb, with thin but intact overlying bony covering. 3. Hyperplastic but clear visible paranasal sinuses.  REVIEW OF SYSTEMS: Out of a complete 14 system review of symptoms, the patient complains only of the following symptoms, and all other reviewed systems are negative.  Memory loss, seizures  ALLERGIES: Allergies  Allergen Reactions  . Penicillins     Difficult Breathing, rash    HOME MEDICATIONS: Outpatient Medications Prior to Visit  Medication Sig Dispense Refill  . amphetamine-dextroamphetamine (ADDERALL) 10 MG tablet Take 1 tablet (10 mg total) by mouth 2 (two) times daily. 180 tablet 0  . Ascorbic Acid (VITAMIN C) 500 MG tablet Take 500 mg by mouth daily.    Marland Kitchen aspirin 81 MG tablet Take 81 mg by mouth daily.    Marland Kitchen diltiazem (CARTIA XT) 120 MG 24 hr capsule TAKE 1 CAPSULE BY MOUTH  DAILY. 90 capsule 3  . donepezil (ARICEPT) 10 MG tablet TAKE 1 TABLET BY MOUTH  DAILY 90 tablet 3  . fexofenadine (ALLEGRA) 180 MG tablet Take 180 mg by mouth daily.    . Ginkgo Biloba 40 MG TABS Take by mouth as directed.    . lamoTRIgine (LAMICTAL) 100 MG tablet TAKE 1 TABLET BY MOUTH  TWICE DAILY 180 tablet 3  . levothyroxine (SYNTHROID) 112 MCG tablet Take 1 tablet (112 mcg total) by mouth daily before breakfast. 90 tablet 3   . Melatonin 5 MG CAPS Take by mouth as directed.    . memantine (NAMENDA) 10 MG tablet TAKE 1 TABLET BY MOUTH  TWICE DAILY 180 tablet 3  . metoprolol succinate (TOPROL XL) 25 MG 24 hr tablet Take 1 tablet (25 mg total) by mouth daily. 90 tablet 3  . montelukast (SINGULAIR) 10 MG tablet Take 1 tablet (10 mg total) by mouth daily. 90 tablet 3  . Multiple Vitamin (MULTIVITAMIN) tablet Take 1 tablet by mouth daily.    . NON FORMULARY Focus factor 1 tab po bid    . rosuvastatin (CRESTOR) 20 MG tablet TAKE 1 TABLET BY MOUTH AT  BEDTIME 90 tablet 0   No facility-administered medications prior to visit.    PAST MEDICAL HISTORY: Past Medical History:  Diagnosis Date  . ADD (attention deficit disorder)   . Allergy    seasonal flares of perinneal allergies  . Cancer ([place])    melanoma  . Cardiac arrest ([place]) 07/1997   aborted  . Dementia ([place])    post resusitative  . Depression   . Diverticulosis   . Family hx of prostate cancer   .  GERD (gastroesophageal reflux disease)   . Headache(784.0)   . Hyperlipidemia   . Hypothyroidism   . ICD (implantable cardiac defibrillator) in place   . Idiopathic urticaria   . Internal hemorrhoids   . Low back pain syndrome   . Melanoma in situ of skin of trunk (H[facility])    chest  . PVC (premature ventricular contraction)    associated with cardiac arrest  . Sleep apnea    no c-pap  . Tubular adenoma of colon 10/2010    PAST SURGICAL HISTORY: Past Surgical History:  Procedure Laterality Date  .  implantation x2    . CARDIAC DEFIBRILLATOR PLACEMENT  07/01/2010   Explanation of a previously implanted device, pocket revision, and insertion of a new device, and intraoperative defibrillation threshold testing Caryl Comes)  . CARDIAC DEFIBRILLATOR REMOVAL  2004   Defibrillator change Caryl Comes) 3 times changed  . HEMORRHOID SURGERY    . ICD    . TONSILLECTOMY      FAMILY HISTORY: Family History  Problem Relation Age of Onset  . Hypertension Mother   .  Heart disease Father   . Diverticulosis Father   . Coronary artery disease Father   . Alopecia Father   . Prostate cancer Brother   . Lung cancer Paternal Grandfather        Smoker  . Cancer Paternal Grandfather        lung cancer - smoker  . Diabetes Brother   . Heart disease Brother        heart transplant  . Colon cancer Neg Hx     SOCIAL HISTORY: Social History   Socioeconomic History  . Marital status: Married    Spouse name: [name]  . Number of children: 2  . Years of education: College  . Highest education level: Not on file  Occupational History    Employer: DISABLED  Tobacco Use  . Smoking status: Former Smoker    Packs/day: 1.00    Years: 10.00    Pack years: 10.00    Types: Cigarettes    Quit date: 07/06/1995    Years since quitting: 24.9  . Smokeless tobacco: Former Systems developer    Types: Chew    Quit date: 11/04/1994  Vaping Use  . Vaping Use: Never used  Substance and Sexual Activity  . Alcohol use: No    Alcohol/week: 0.0 standard drinks    Comment: socially  . Drug use: No  . Sexual activity: Not on file  Other Topics Concern  . Not on file  Social History Narrative   Patient is married [name]) [phone] with 2 daughters   5 grandchildren   Daily caffeine use - one cup of coffee and one-two cokes per day   Patient is right-handed.   Patient has a college education   Social Determinants of Radio broadcast assistant Strain: Low Risk   . Difficulty of Paying Living Expenses: Not hard at all  Food Insecurity: No Food Insecurity  . Worried About Charity fundraiser in the Last Year: Never true  . Ran Out of Food in the Last Year: Never true  Transportation Needs: No Transportation Needs  . Lack of Transportation (Medical): No  . Lack of Transportation (Non-Medical): No  Physical Activity: Inactive  . Days of Exercise per Week: 0 days  . Minutes of Exercise per Session: 0 min  Stress: No Stress Concern Present  . Feeling of Stress : Not at all  Social  Connections: Not on file  Intimate  Partner Violence: Not At Risk  . Fear of Current or Ex-Partner: No  . Emotionally Abused: No  . Physically Abused: No  . Sexually Abused: No   PHYSICAL EXAM  Vitals:   06/19/20 0737  BP: 136/88  Pulse: (!) 106  Weight: 181 lb (82.1 kg)  Height: 5\' 9"  (1.753 m)   Body mass index is 26.73 kg/m.  Generalized: Well developed, in no acute distress  MMSE - Mini Mental State Exam 12/11/2019 08/29/2019 06/12/2019  Orientation to time 5 5 5   Orientation to Place 5 5 5   Registration 3 3 3   Attention/ Calculation 5 5 1   Recall 1 3 3   Recall-comments - - -  Language- name 2 objects 2 - 2  Language- repeat 1 1 1   Language- follow 3 step command 3 - 3  Language- read & follow direction 1 - 1  Write a sentence 1 - 1  Copy design 1 - 1  Copy design-comments 12 animals - named 15 animals  Total score 28 - 26   NEUROLOGICAL EXAM:  MENTAL STATUS: Speech/Cognition: Awake, alert, normal speech, oriented to history taking and casual conversation.  CRANIAL NERVES: CN II: Visual fields are full to confrontation.  Pupils are small, reactive CN III, IV, VI: extraocular movement are normal. No ptosis. CN V: Facial sensation is intact to light touch. CN VII: Face is symmetric with normal eye closure and smile. CN VIII: Hearing is normal to casual conversation CN IX, X: Palate elevates symmetrically. Phonation is normal. CN XI: Head turning and shoulder shrug are intact  MOTOR: Fixation upper extremity on rapid rotating movement  REFLEXES: Reflexes are 2  and symmetric at the biceps, triceps, knees and ankles. Plantar responses are flexor.  SENSORY: Intact to light touch, pinprick, positional and vibratory sensation at fingers and toes.  COORDINATION: There is no trunk or limb ataxia.    GAIT/STANCE: He needs push-up to get up from seated position, tends to hold his left arm in elbow flexion, mildly wide-based, unsteady  DIAGNOSTIC DATA (LABS,  IMAGING, TESTING) - I reviewed patient records, labs, notes, testing and imaging myself where available.  Lab Results  Component Value Date   WBC 5.4 06/12/2019   HGB 14.6 06/12/2019   HCT 41.5 06/12/2019   MCV 91 06/12/2019   PLT 310 06/12/2019      Component Value Date/Time   NA 140 06/12/2019 1015   K 4.0 06/12/2019 1015   CL 102 06/12/2019 1015   CO2 24 06/12/2019 1015   GLUCOSE 117 (H) 06/12/2019 1015   GLUCOSE 95 08/11/2018 0825   BUN 13 06/12/2019 1015   CREATININE 1.00 10/17/2019 1425   CREATININE 1.07 12/23/2017 1451   CALCIUM 9.2 06/12/2019 1015   PROT 6.6 06/12/2019 1015   ALBUMIN 4.4 06/12/2019 1015   AST 32 06/12/2019 1015   ALT 40 06/12/2019 1015   ALKPHOS 102 06/12/2019 1015   BILITOT 0.3 06/12/2019 1015   GFRNONAA 88 06/12/2019 1015   GFRAA 101 06/12/2019 1015   Lab Results  Component Value Date   CHOL 190 08/29/2019   HDL 48.80 08/29/2019   LDLCALC 106 (H) 08/29/2019   LDLDIRECT 127.6 03/05/2014   TRIG 176.0 (H) 08/29/2019   CHOLHDL 4 08/29/2019   Lab Results  Component Value Date   HGBA1C 5.9 (H) 08/29/2019   No results found for: [id number] Lab Results  Component Value Date   TSH 6.29 (H) 08/29/2019      ASSESSMENT AND PLAN 69 y.o. year old  male    Anoxic brain injury in 1999 due to cardiac arrest,  With residual mild cognitive impairment  Slow worsening  Continue Aricept 10 mg daily, Namenda 10 mg twice a day  Encouraged moderate exercise  Seizure  Most recent in June 2019, EEG was normal  Continue lamotrigine 100 mg twice a day Gait abnormality  Also related to the previous anoxic brain injury, deconditioning,  Larry [name], M.D. Ph.D.  [facility] [place], [place] [address] Phone: [phone] Fax:      [phone]

## 2020-07-11 ENCOUNTER — Ambulatory Visit (INDEPENDENT_AMBULATORY_CARE_PROVIDER_SITE_OTHER): Payer: Medicare Other

## 2020-07-11 DIAGNOSIS — Z8674 Personal history of sudden cardiac arrest: Secondary | ICD-10-CM | POA: Diagnosis not present

## 2020-07-12 LAB — CUP PACEART REMOTE DEVICE CHECK
Battery Remaining Longevity: 42 mo
Battery Remaining Percentage: 44 %
Brady Statistic RV Percent Paced: 1 %
Date Time Interrogation Session: 20220107040000
HighPow Impedance: 64 Ohm
Implantable Lead Implant Date: 19990204
Implantable Lead Location: 753860
Implantable Lead Model: 135
Implantable Lead Serial Number: 301213
Implantable Pulse Generator Implant Date: 20111228
Lead Channel Impedance Value: 772 Ohm
Lead Channel Pacing Threshold Amplitude: 1.2 V
Lead Channel Pacing Threshold Pulse Width: 1 ms
Lead Channel Setting Pacing Amplitude: 2.4 V
Lead Channel Setting Pacing Pulse Width: 1 ms
Lead Channel Setting Sensing Sensitivity: 0.4 mV
Pulse Gen Serial Number: 277857

## 2020-07-17 ENCOUNTER — Other Ambulatory Visit: Payer: Self-pay | Admitting: Family Medicine

## 2020-07-25 NOTE — Progress Notes (Signed)
Remote ICD transmission.   

## 2020-08-01 ENCOUNTER — Other Ambulatory Visit: Payer: Self-pay | Admitting: Family Medicine

## 2020-08-15 ENCOUNTER — Other Ambulatory Visit: Payer: Self-pay | Admitting: Family Medicine

## 2020-08-15 DIAGNOSIS — I10 Essential (primary) hypertension: Secondary | ICD-10-CM

## 2020-08-15 DIAGNOSIS — E7849 Other hyperlipidemia: Secondary | ICD-10-CM

## 2020-08-20 ENCOUNTER — Telehealth: Payer: Self-pay | Admitting: Family Medicine

## 2020-08-20 MED ORDER — AMPHETAMINE-DEXTROAMPHETAMINE 10 MG PO TABS: 10.0000 mg | ORAL_TABLET | Freq: Two times a day (BID) | ORAL | 0 refills | Status: DC

## 2020-08-20 NOTE — Addendum Note (Signed)
Addended by: [name] on: 08/20/2020 04:57 PM   Modules accepted: Orders

## 2020-08-20 NOTE — Telephone Encounter (Signed)
Sent. Thanks.  Needs yearly visit this spring. Please schedule.

## 2020-08-20 NOTE — Telephone Encounter (Signed)
Patient is in need of Refill on Prescription Adderall [facility] is the pharmacy. Please advise. EM

## 2020-08-20 NOTE — Telephone Encounter (Signed)
LOV- 09/04/19 Next OV - not scheduled Last refilled - 04/23/20 #180/0

## 2020-08-25 NOTE — Telephone Encounter (Signed)
LVMTCB

## 2020-08-26 NOTE — Telephone Encounter (Signed)
Patient scheduled appt for 10/07/20

## 2020-09-21 ENCOUNTER — Other Ambulatory Visit: Payer: Self-pay | Admitting: Family Medicine

## 2020-09-21 DIAGNOSIS — I10 Essential (primary) hypertension: Secondary | ICD-10-CM

## 2020-09-21 DIAGNOSIS — Z125 Encounter for screening for malignant neoplasm of prostate: Secondary | ICD-10-CM

## 2020-09-21 DIAGNOSIS — R739 Hyperglycemia, unspecified: Secondary | ICD-10-CM

## 2020-09-21 DIAGNOSIS — E785 Hyperlipidemia, unspecified: Secondary | ICD-10-CM

## 2020-09-28 ENCOUNTER — Emergency Department: Payer: Medicare Other

## 2020-09-28 ENCOUNTER — Other Ambulatory Visit: Payer: Self-pay | Admitting: Family Medicine

## 2020-09-28 ENCOUNTER — Emergency Department
Admission: EM | Admit: 2020-09-28 | Discharge: 2020-09-28 | Disposition: A | Discharge status: Home / Self Care | Payer: Medicare Other | Attending: Emergency Medicine | Admitting: Emergency Medicine

## 2020-09-28 ENCOUNTER — Encounter: Payer: Self-pay | Admitting: Emergency Medicine

## 2020-09-28 ENCOUNTER — Other Ambulatory Visit: Payer: Self-pay

## 2020-09-28 DIAGNOSIS — R519 Headache, unspecified: Secondary | ICD-10-CM

## 2020-09-28 DIAGNOSIS — Z79899 Other long term (current) drug therapy: Secondary | ICD-10-CM | POA: Diagnosis not present

## 2020-09-28 DIAGNOSIS — I10 Essential (primary) hypertension: Secondary | ICD-10-CM | POA: Insufficient documentation

## 2020-09-28 DIAGNOSIS — F0390 Unspecified dementia without behavioral disturbance: Secondary | ICD-10-CM | POA: Diagnosis not present

## 2020-09-28 DIAGNOSIS — E039 Hypothyroidism, unspecified: Secondary | ICD-10-CM | POA: Insufficient documentation

## 2020-09-28 DIAGNOSIS — Z87891 Personal history of nicotine dependence: Secondary | ICD-10-CM | POA: Insufficient documentation

## 2020-09-28 DIAGNOSIS — H538 Other visual disturbances: Secondary | ICD-10-CM | POA: Insufficient documentation

## 2020-09-28 DIAGNOSIS — Z7982 Long term (current) use of aspirin: Secondary | ICD-10-CM | POA: Insufficient documentation

## 2020-09-28 DIAGNOSIS — Z85828 Personal history of other malignant neoplasm of skin: Secondary | ICD-10-CM | POA: Insufficient documentation

## 2020-09-28 DIAGNOSIS — H9312 Tinnitus, left ear: Secondary | ICD-10-CM | POA: Diagnosis not present

## 2020-09-28 DIAGNOSIS — R9431 Abnormal electrocardiogram [ECG] [EKG]: Secondary | ICD-10-CM | POA: Diagnosis not present

## 2020-09-28 DIAGNOSIS — G319 Degenerative disease of nervous system, unspecified: Secondary | ICD-10-CM | POA: Diagnosis not present

## 2020-09-28 LAB — CBC
HCT: 40.8 % (ref 39.0–52.0)
Hemoglobin: 13.9 g/dL (ref 13.0–17.0)
MCH: 31.4 pg (ref 26.0–34.0)
MCHC: 34.1 g/dL (ref 30.0–36.0)
MCV: 92.1 fL (ref 80.0–100.0)
Platelets: 319 10*3/uL (ref 150–400)
RBC: 4.43 MIL/uL (ref 4.22–5.81)
RDW: 12.2 % (ref 11.5–15.5)
WBC: 8.7 10*3/uL (ref 4.0–10.5)
nRBC: 0 % (ref 0.0–0.2)

## 2020-09-28 LAB — BASIC METABOLIC PANEL
Anion gap: 6 (ref 5–15)
BUN: 17 mg/dL (ref 8–23)
CO2: 28 mmol/L (ref 22–32)
Calcium: 9.2 mg/dL (ref 8.9–10.3)
Chloride: 107 mmol/L (ref 98–111)
Creatinine, Ser: 0.89 mg/dL (ref 0.61–1.24)
GFR, Estimated: >60 mL/min (ref >60)
Glucose, Bld: 140 mg/dL — ABNORMAL HIGH (ref 70–99)
Potassium: 3.7 mmol/L (ref 3.5–5.1)
Sodium: 141 mmol/L (ref 135–145)

## 2020-09-28 LAB — SEDIMENTATION RATE: Sed Rate: 4 mm/hr (ref 0–20)

## 2020-09-28 LAB — TROPONIN I (HIGH SENSITIVITY): Troponin I (High Sensitivity): 4 ng/L (ref <18)

## 2020-09-28 MED ORDER — FLUORESCEIN SODIUM 1 MG OP STRP
1.0000 | ORAL_STRIP | Freq: Once | OPHTHALMIC | Status: AC
  Administered 2020-09-28: 1 via OPHTHALMIC
  Filled 2020-09-28: qty 1

## 2020-09-28 MED ORDER — IOHEXOL 350 MG/ML SOLN
75.0000 mL | Freq: Once | INTRAVENOUS | Status: AC | PRN
  Administered 2020-09-28: 75 mL via INTRAVENOUS

## 2020-09-28 MED ORDER — TETRACAINE HCL 0.5 % OP SOLN
1.0000 [drp] | Freq: Once | OPHTHALMIC | Status: AC
  Administered 2020-09-28: 1 [drp] via OPHTHALMIC
  Filled 2020-09-28: qty 4

## 2020-09-28 NOTE — ED Triage Notes (Addendum)
Pt via POV from home. Pt c/o L sided intermittent headache mostly behind his eye and L sided tinnitus. Pt states that this has been going on for over a month. Pt states that he had some blurred vision in the L eye started around 5:00pm yesterday. No weakness. No numbness/tingling in any extremity. No light or sound sensitivity. No weakness. Denies hx of stroke or migraines. Pt is A&Ox4 and NAD.

## 2020-09-28 NOTE — ED Provider Notes (Signed)
[facility] Emergency Department Provider Note  ____________________________________________   Event Date/Time   First MD Initiated Contact with Patient 09/28/20 1613     (approximate)  I have reviewed the triage vital signs and the nursing notes.   HISTORY  Chief Complaint Headache    HPI Larry [name] is a 70 y.o. male with history as below who comes in with headache.  Patient reports for last few months he has had these intermittent episodes of shooting electrical pain on the left side of his face, unclear what brings it on, better on its own, last for a few seconds, moderate.  He states however yesterday though he started having a little bit of blurriness in his vision when they came on.  He reports that he still feels little fogginess of his left eye but states that is much better than earlier when he had the pain.  He denies any pain at this time.  No chest pain, shortness of breath, abdominal pain.  To note patient had an anoxic brain injury secondary to cardiac arrest in 1999      Past Medical History:  Diagnosis Date  . ADD (attention deficit disorder)   . Allergy    seasonal flares of perinneal allergies  . Cancer ([place])    melanoma  . Cardiac arrest ([place]) 07/1997   aborted  . Dementia ([place])    post resusitative  . Depression   . Diverticulosis   . Family hx of prostate cancer   . GERD (gastroesophageal reflux disease)   . Headache(784.0)   . Hyperlipidemia   . Hypothyroidism   . ICD (implantable cardiac defibrillator) in place   . Idiopathic urticaria   . Internal hemorrhoids   . Low back pain syndrome   . Melanoma in situ of skin of trunk (HCC)    chest  . PVC (premature ventricular contraction)    associated with cardiac arrest  . Sleep apnea    no c-pap  . Tubular adenoma of colon 10/2010    Patient Active Problem List   Diagnosis Date Noted  . Tinnitus 09/09/2019  . History of sudden cardiac arrest 01/09/2019  .  Viral syndrome 09/06/2018  . Seizures ([place]) 03/02/2018  . Syncope 12/26/2017  . Other social stressor 12/26/2017  . Health care maintenance 08/14/2017  . Family hx of prostate cancer   . Allergy   . Insomnia 10/20/2015  . Advance care planning 03/08/2014  . Memory loss 09/14/2013  . Hypoxic brain injury ([place]) 09/14/2013  . Medicare annual wellness visit, subsequent 02/16/2013  . Shortness of breath 08/10/2011  . Implantable cardioverter-defibrillator (ICD) in situ 02/03/2009  . Hypothyroidism 11/18/2008  . CONTACT DERMATITIS&OTHER ECZEMA DUE TO PLANTS 02/03/2008  . IDIOPATHIC URTICARIA 02/03/2008  . Hypertension 02/03/2008  . HLD (hyperlipidemia) 2007-11-10  . Attention deficit disorder 2007/11/10  . PVC/VT non sustained 11/10/07  . Sleep apnea 2007-11-10  . HEADACHE 11-10-07  . SUDDEN DEATH-aborted 11/10/2007  . LOW BACK PAIN SYNDROME 10/11/2007    Past Surgical History:  Procedure Laterality Date  .  implantation x2    . CARDIAC DEFIBRILLATOR PLACEMENT  07/01/2010   Explanation of a previously implanted device, pocket revision, and insertion of a new device, and intraoperative defibrillation threshold testing Caryl Comes)  . CARDIAC DEFIBRILLATOR REMOVAL  2004   Defibrillator change Caryl Comes) 3 times changed  . HEMORRHOID SURGERY    . ICD    . TONSILLECTOMY      Prior to Admission medications  Medication Sig Start Date End Date Taking? Authorizing Provider  amphetamine-dextroamphetamine (ADDERALL) 10 MG tablet Take 1 tablet (10 mg total) by mouth 2 (two) times daily. 08/20/20   [name], MD  Ascorbic Acid (VITAMIN C) 500 MG tablet Take 500 mg by mouth daily.    [provider]  aspirin 81 MG tablet Take 81 mg by mouth daily.    [provider]  diltiazem (CARTIA XT) 120 MG 24 hr capsule TAKE 1 CAPSULE BY MOUTH  DAILY. 02/27/20   Sherren Mocha, MD  donepezil (ARICEPT) 10 MG tablet TAKE 1 TABLET BY MOUTH  DAILY 03/18/20   Suzzanne Cloud, NP   fexofenadine (ALLEGRA) 180 MG tablet Take 180 mg by mouth daily.    [provider]  Ginkgo Biloba 40 MG TABS Take by mouth as directed.    [provider]  lamoTRIgine (LAMICTAL) 100 MG tablet TAKE 1 TABLET BY MOUTH  TWICE DAILY 03/18/20   Suzzanne Cloud, NP  levothyroxine (SYNTHROID) 112 MCG tablet TAKE 1 TABLET BY MOUTH  DAILY BEFORE BREAKFAST 08/04/20   [name], MD  Melatonin 5 MG CAPS Take by mouth as directed.    [provider]  memantine (NAMENDA) 10 MG tablet TAKE 1 TABLET BY MOUTH  TWICE DAILY 03/18/20   Suzzanne Cloud, NP  metoprolol succinate (TOPROL XL) 25 MG 24 hr tablet Take 1 tablet (25 mg total) by mouth daily. 04/24/20   [name], MD  montelukast (SINGULAIR) 10 MG tablet TAKE 1 TABLET BY MOUTH  DAILY 08/04/20   [name], MD  Multiple Vitamin (MULTIVITAMIN) tablet Take 1 tablet by mouth daily.    [provider]  NON FORMULARY Focus factor 1 tab po bid    [provider]  rosuvastatin (CRESTOR) 20 MG tablet TAKE 1 TABLET BY MOUTH AT  BEDTIME 08/15/20   [name], MD    Allergies Penicillins  Family History  Problem Relation Age of Onset  . Hypertension Mother   . Heart disease Father   . Diverticulosis Father   . Coronary artery disease Father   . Alopecia Father   . Prostate cancer Brother   . Lung cancer Paternal Grandfather        Smoker  . Cancer Paternal Grandfather        lung cancer - smoker  . Diabetes Brother   . Heart disease Brother        heart transplant  . Colon cancer Neg Hx     Social History Social History   Tobacco Use  . Smoking status: Former Smoker    Packs/day: 1.00    Years: 10.00    Pack years: 10.00    Types: Cigarettes    Quit date: 07/06/1995    Years since quitting: 25.2  . Smokeless tobacco: Former Systems developer    Types: Chew    Quit date: 11/04/1994  Vaping Use  . Vaping Use: Never used  Substance Use Topics  . Alcohol use: No    Alcohol/week: 0.0 standard  drinks    Comment: socially  . Drug use: No      Review of Systems Constitutional: No fever/chills Eyes: Positive vision changes ENT: No sore throat. Cardiovascular: Denies chest pain. Respiratory: Denies shortness of breath. Gastrointestinal: No abdominal pain.  No nausea, no vomiting.  No diarrhea.  No constipation. Genitourinary: Negative for dysuria. Musculoskeletal: Negative for back pain. Skin: Negative for rash. Neurological: Positive headache All other ROS negative ____________________________________________  PHYSICAL EXAM:  VITAL SIGNS: ED Triage Vitals  Enc Vitals Group     BP 09/28/20 1532 (!) 155/83     Pulse Rate 09/28/20 1532 66     Resp 09/28/20 1532 16     Temp 09/28/20 1532 97.7 F (36.5 C)     Temp Source 09/28/20 1532 Oral     SpO2 09/28/20 1532 97 %     Weight 09/28/20 1533 175 lb (79.4 kg)     Height 09/28/20 1533 _0  (1.753 m)     Head Circumference --      Peak Flow --      Pain Score 09/28/20 1532 8     Pain Loc --      Pain Edu? --      Excl. in [place]? --     Constitutional: Alert and oriented. Well appearing and in no acute distress. Eyes: Conjunctivae are normal. EOMI. pupils are equal and reactive bilaterally.  No visual field cuts. Head: Atraumatic. Nose: No congestion/rhinnorhea. Mouth/Throat: Mucous membranes are moist.   Neck: No stridor. Trachea Midline. FROM Cardiovascular: Normal rate, regular rhythm. Grossly normal heart sounds.  Good peripheral circulation. Respiratory: Normal respiratory effort.  No retractions. Lungs CTAB. Gastrointestinal: Soft and nontender. No distention. No abdominal bruits.  Musculoskeletal: No lower extremity tenderness nor edema.  No joint effusions. Neurologic: Cranial 2-12 intact.  Equal strength in arms and legs.   Skin:  Skin is warm, dry and intact. No rash noted. Psychiatric: Mood and affect are normal. Speech and behavior are normal. GU: Deferred    ____________________________________________   LABS (all labs ordered are listed, but only abnormal results are displayed)  Labs Reviewed  BASIC METABOLIC PANEL - Abnormal; Notable for the following components:      Result Value   Glucose, Bld 140 (*)    All other components within normal limits  CBC  SEDIMENTATION RATE  TROPONIN I (HIGH SENSITIVITY)   ____________________________________________   ED ECG REPORT I, Vanessa [place], the attending physician, personally viewed and interpreted this ECG.  Normal sinus rate of 72, no ST elevation although does have some artifact in lead I and lead II, no T wave inversions, normal intervals ____________________________________________  RADIOLOGY   Official radiology report(s): CT VENOGRAM HEAD  Result Date: 09/28/2020 CLINICAL DATA:  Left-sided headache with vision changes with left-sided tinnitus. EXAM: CT VENOGRAM HEAD TECHNIQUE: A noncontrast CT head with performed. This was followed by venographic phase images of the head obtained following administration of intravenous contrast. Multiplanar reformats and maximum intensity projections were utilized. CONTRAST:  14m OMNIPAQUE IOHEXOL 350 MG/ML SOLN COMPARISON:  None. FINDINGS: Brain: There is no mass, hemorrhage or extra-axial collection. The appearance of the white matter is normal for the patient's age. There is generalized atrophy. Vascular: No abnormal hyperdensity of the major intracranial arteries or dural venous sinuses. No intracranial atherosclerosis. Superior sagittal sinus: Normal. Straight sinus: Normal. Inferior sagittal sinus, vein of Galen and internal cerebral veins: Normal. Transverse sinuses: Normal. Sigmoid sinuses: Normal. Visualized jugular veins: Normal. Skull: The visualized skull base, calvarium and extracranial soft tissues are normal. Sinuses/Orbits: No fluid levels or advanced mucosal thickening of the visualized paranasal sinuses. No mastoid or middle ear  effusion. The orbits are normal. IMPRESSION: 1. No dural venous sinus thrombosis. 2. No acute intracranial abnormality. Electronically Signed   By: KUlyses JarredM.D.   On: 09/28/2020 19:15    ____________________________________________   PROCEDURES  Procedure(s) performed (including Critical Care):  Procedures   ____________________________________________  INITIAL IMPRESSION / ASSESSMENT AND PLAN / ED COURSE  TREVOR WILKIE was evaluated in Emergency Department on 09/28/2020 for the symptoms described in the history of present illness. He was evaluated in the context of the global COVID-19 pandemic, which necessitated consideration that the patient might be at risk for infection with the SARS-CoV-2 virus that causes COVID-19. Institutional protocols and algorithms that pertain to the evaluation of patients at risk for COVID-19 are in a state of rapid change based on information released by regulatory bodies including the CDC and federal and state organizations. These policies and algorithms were followed during the patient's care in the ED.    Patient is a 70 year old who comes in with left-sided pain in his face with some occasional vision changes.  Patient denies any vision loss to suggest a TIA.  His pupils reactive I have lower suspicion for glaucoma. Does not sound like retinal detachment.  Consider complex migraine and will be ESR to evaluate for temporal arteritis and CTV to evaluate for mass versus venous thrombus.  Consider trigeminal neuralgia.  No other neuro deficit to suggest Bell's palsy.  Will check vision.  I do not think he has a dissection given he is got no pupillary defect no mechanism.   8:29 PM reevaluated patient still has the blurry vision in the left eye  His pressures were normal and there was no evidence of abrasions on fluorescein stain.  I discussed with family he is obviously very concerned given his history I discussed the different options and will proceed  with MRI although low suspicion for neurological process  I did discuss with case with Dr. Edison Pace ophtho status reassuring that he has no other extraocular movements issues and exam overall re-assuring can follow up outpt with ophtho.   Unfortunately pt cant get MRI due to ICD-he provided [place] a card but he is not sure if that is the current ICD he has in or not. But the card provided was not MRI compatible.  Even if it was we discussed how [facility] would have [facility]. They expressed understanding and given lower suspicion for life threatening neurological process causing this they feel comfortable following up outpt with neuro and ophtho. If symptoms change or worsen they will return immediately.       ____________________________________________   FINAL CLINICAL IMPRESSION(S) / ED DIAGNOSES   Final diagnoses:  Blurry vision, left eye  Intractable headache, unspecified chronicity pattern, unspecified headache type      MEDICATIONS GIVEN DURING THIS VISIT:  Medications  iohexol (OMNIPAQUE) 350 MG/ML injection 75 mL (75 mLs Intravenous Contrast Given 09/28/20 1836)  tetracaine (PONTOCAINE) 0.5 % ophthalmic solution 1 drop (1 drop Left Eye Given by Other 09/28/20 1937)  fluorescein ophthalmic strip 1 strip (1 strip Left Eye Given 09/28/20 1938)     ED Discharge Orders    None       Note:  This document was prepared using Dragon voice recognition software and may include unintentional dictation errors.   Vanessa [place], MD 09/29/20 [phone]

## 2020-09-28 NOTE — Discharge Instructions (Addendum)
We did a CT head and a CTV that were negative.  We did an ESR that was negative.  We did a cardiac marker that was negative for heart attack.  We did not see any abrasions on the MRI.  We discussed doing an MRI but it does not seem like this is consistent with a stroke given his symptoms but because of his ICD we cannot do the MRI here.  You should follow-up with your neurologist and an eye doctor.  Given the blurry vision I would like him to be seen by eye doctor early next week so please call to make an appointment.   IMPRESSION:  1. No dural venous sinus thrombosis.  2. No acute intracranial abnormality.

## 2020-09-28 NOTE — ED Notes (Signed)
Pt out to nurse's station requesting IV be removed. Dr. Jari Pigg states okay to remove, pt to be discharged shortly.

## 2020-09-29 ENCOUNTER — Telehealth: Payer: Self-pay | Admitting: Neurology

## 2020-09-29 NOTE — Telephone Encounter (Signed)
His wife called that he has had some spells of atypical facial pain (left temple and eye) lasting a few minutes to an hour.   He went to the [facility] ED (ESR was 4, head CT and venogram normal).  Due to pacemaker an MRI would need to be done at [facility].  She would like to get him in for an appointment

## 2020-09-29 NOTE — Telephone Encounter (Signed)
I spoke to the patient's wife and reschedule his appt to 09/30/20.

## 2020-09-29 NOTE — Telephone Encounter (Signed)
Pt's wife, Larry [name] (on [place]) called, his vision has gotten worse. We need a sooner appt than 4/14. Would like the nurse to call me.

## 2020-09-29 NOTE — Telephone Encounter (Signed)
Ok to put on my schedule for next available.

## 2020-09-30 ENCOUNTER — Encounter: Payer: Self-pay | Admitting: Neurology

## 2020-09-30 ENCOUNTER — Ambulatory Visit: Payer: Medicare Other | Admitting: Neurology

## 2020-09-30 ENCOUNTER — Other Ambulatory Visit
Admission: RE | Admit: 2020-09-30 | Discharge: 2020-09-30 | Disposition: A | Discharge status: Home / Self Care | Payer: Medicare Other | Source: Ambulatory Visit | Attending: Ophthalmology | Admitting: Ophthalmology

## 2020-09-30 VITALS — BP 142/84 | HR 62 | Ht 69.0 in | Wt 183.5 lb

## 2020-09-30 DIAGNOSIS — R569 Unspecified convulsions: Secondary | ICD-10-CM | POA: Diagnosis not present

## 2020-09-30 DIAGNOSIS — R413 Other amnesia: Secondary | ICD-10-CM | POA: Diagnosis not present

## 2020-09-30 DIAGNOSIS — R519 Headache, unspecified: Secondary | ICD-10-CM | POA: Diagnosis not present

## 2020-09-30 DIAGNOSIS — G931 Anoxic brain damage, not elsewhere classified: Secondary | ICD-10-CM | POA: Diagnosis not present

## 2020-09-30 LAB — C-REACTIVE PROTEIN: CRP: 1.9 mg/dL — ABNORMAL HIGH (ref <1.0)

## 2020-09-30 NOTE — Progress Notes (Addendum)
HISTORY OF PRESENT ILLNESS:Larry [name] is a very friendly 70 year old right-handed gentleman presents for followup consultation of his memory loss in the context of anoxic brain injury.   He is accompanied by his wife today. He was a patient of Dr. Erling [name], The patient has an underlying medical history of cardiac death from ventricular fibrillation with hypoxic brain injury in Jan 1999 due to arrythmia, 15 minutes without heart beat, status post defibrillator placement, He stayed in hospital for 35 days, required prolonged rehabilitation, has to relearn walking again, significant amnesia, "as if that he did not have a past".  EEG showed diffuse slowing in January 1999, CT head and brain MRI were normal. CT head repeat in November 2000 showed slight prominence of the lateral and third ventricles. CT head in July 2003 showed prominence of the ventricular system. Psychological testing and August 1999 showed borderline performance IQ. He became depressed. In January 2014 his MMSE was 29, clock drawing was 4, animal fluency was 10. He went to [facility] for 2 years for patient with degenerative disorder.  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 He lives at home with his wife, still driving. He helps the household, he still has difficulty focusing.   He has had some fluctuation in his memory per wife. He has ADD and continues to take Adderall, 10 mg twice a day, without Adderall, he could not remember the pages that he has read,he is also taking Aricept 10 mg every day, which has been very helpful too.  Up date April 14 2016YY  His overall doing very well, he likes play computer games, which has helped his eye and hands coordination, he had recent trip to [place], felt overwhelming, he continued to drive short distance,  He is taking Ritalin 10 mg twice a day, Aricept 10 mg once a day, complains of insomnia, he took  his last dose of Ritalin at evening time.  UPDATE Apr 21 2015:YY He is now taking Namenda 52m bid and aricept 170mqday, he still has trouble sleeping, He watch movie, stay up late, he continues to have focus difficulty, tends to become agitated.  UPDATE April 27th 2017: YY He still has chronic insomnia, tends to have irregular sleep pattern, stay up late watching TV, he still very active during the day, take Adderall 10 mg twice a day, still drives short distance, mild unsteady gait, tends to hold his left arm to his chest  UPDATE Oct 17th 2017:YY He is with his wife, Her daughter was hospital, presented with sudden loss of consciousness yesterday April 19 2016, which has brought back a lot of his memory, he has not tried clonazepamfor chronic insomnia, worry about side effect, tried melatonin, still has significant insomnia, he is taking Tylenol pm,   UPDATE March 02 2018: He was follow-up by [facility] 2018, on December 19, 2017, [place] he had sudden onset dizziness, feels like going to pass out, then had transient loss of consciousness, eyes rolled back, staring, lasting for 1 minutes, followed by postevent mild confusion and slurred speech last about 40 minutes, was treated at local hospital.  I personally reviewed CT head without contrast: There was no acute abnormality, generalized atrophy, ventriculomegaly, laboratory evaluation, EKG showed no significant abnormality.  Similar spells in March 2019, transient dizziness followed by sudden loss of muscle tone, consciousness, extreme fatigue, slept for 1 hour  afterwards,  Patient reported that he actually has frequent spells about once a month, he has transient confusion, feels like he is going to pass out, short lasting,  UPDATE Jun 05 2018: He tolerated lamotrigine very well, taking 100 mg twice a day without significant side effect,  EEG wasnormal. Since lamotrigine, he no  longer has recurrent spells of rising sensation transient confusion even though there was no clinical seizure activity observed during those spells, he had those spells about once a week prior to lamotrigine,  UPDATE Jun 19 2020: Is accompanied by his wife at today's clinical visit, slow worsening memory loss, when he goes to a different room, he often forgets why is he there, slight worsening gait abnormality, he also complains of worsening tinnitus, difficulty sleeping sometimes,  I personally reviewed CT temporal bone w/wo in April 2021:  1. Negative CT appearance of the bilateral temporal bones. No explanation for hearing loss. 2. Note a mildly high riding right jugular bulb, with thin but intact overlying bony covering. 3. Hyperplastic but clear visible paranasal sinuses.  UPDATE March 29th 2022: He used to have headache even before his cardiac arrest, only occasionally, mild, his 2 daughter does has typical migraine headache,  Over the years, he has occasionally headache usually short lasting, on March 26, he complains of transient sharp pain shooting from the left parietal region downward, lasting for few seconds, then had a trickle of pressure headache, take ibuprofen, which did improve  Next morning March 27 Sunday, while at church, at the end of the sermon, he was noted to holding his left side, he also noticed transient blurry vision, was brought to the emergency room,  I personally reviewed CT head with contrast, no acute intracranial abnormality, no venous sinus thrombosis, arterial branch that was visualized showed no significant abnormality  Laboratory showed normal ESR, negative troponin, normal CBC, BMP with exception of elevated glucose 140, was seen by ophthalmology today, no significant abnormality found, was told he has cataract, left side was slightly worse than the right  REVIEW OF SYSTEMS: Out of a complete 14 system review of symptoms, the patient complains only of the  following symptoms, and all other reviewed systems are negative.  Memory loss, seizures  ALLERGIES: Allergies  Allergen Reactions  . Penicillins     Difficult Breathing, rash    HOME MEDICATIONS: Outpatient Medications Prior to Visit  Medication Sig Dispense Refill  . amphetamine-dextroamphetamine (ADDERALL) 10 MG tablet Take 1 tablet (10 mg total) by mouth 2 (two) times daily. 180 tablet 0  . Ascorbic Acid (VITAMIN C) 500 MG tablet Take 500 mg by mouth daily.    Marland Kitchen aspirin 81 MG tablet Take 81 mg by mouth daily.    Marland Kitchen diltiazem (CARTIA XT) 120 MG 24 hr capsule TAKE 1 CAPSULE BY MOUTH  DAILY. 90 capsule 3  . donepezil (ARICEPT) 10 MG tablet TAKE 1 TABLET BY MOUTH  DAILY 90 tablet 3  . fexofenadine (ALLEGRA) 180 MG tablet Take 180 mg by mouth daily.    . Ginkgo Biloba 40 MG TABS Take by mouth as directed.    . lamoTRIgine (LAMICTAL) 100 MG tablet TAKE 1 TABLET BY MOUTH  TWICE DAILY 180 tablet 3  . levothyroxine (SYNTHROID) 112 MCG tablet TAKE 1 TABLET BY MOUTH  DAILY BEFORE BREAKFAST 90 tablet 3  . Melatonin 5 MG CAPS Take by mouth as directed.    . memantine (NAMENDA) 10 MG tablet TAKE 1 TABLET BY MOUTH  TWICE DAILY 180 tablet 3  .  metoprolol succinate (TOPROL XL) 25 MG 24 hr tablet Take 1 tablet (25 mg total) by mouth daily. 90 tablet 3  . montelukast (SINGULAIR) 10 MG tablet TAKE 1 TABLET BY MOUTH  DAILY 90 tablet 3  . Multiple Vitamin (MULTIVITAMIN) tablet Take 1 tablet by mouth daily.    . NON FORMULARY Focus factor 1 tab po bid    . rosuvastatin (CRESTOR) 20 MG tablet TAKE 1 TABLET BY MOUTH AT  BEDTIME 90 tablet 0   No facility-administered medications prior to visit.    PAST MEDICAL HISTORY: Past Medical History:  Diagnosis Date  . ADD (attention deficit disorder)   . Allergy    seasonal flares of perinneal allergies  . Cancer ([place])    melanoma  . Cardiac arrest ([place]) 07/1997   aborted  . Dementia ([place])    post resusitative  . Depression   . Diverticulosis   .  Family hx of prostate cancer   . GERD (gastroesophageal reflux disease)   . Headache(784.0)   . Hyperlipidemia   . Hypothyroidism   . ICD (implantable cardiac defibrillator) in place   . Idiopathic urticaria   . Internal hemorrhoids   . Low back pain syndrome   . Melanoma in situ of skin of trunk ([facility])    chest  . PVC (premature ventricular contraction)    associated with cardiac arrest  . Sleep apnea    no c-pap  . Tubular adenoma of colon 10/2010    PAST SURGICAL HISTORY: Past Surgical History:  Procedure Laterality Date  .  implantation x2    . CARDIAC DEFIBRILLATOR PLACEMENT  07/01/2010   Explanation of a previously implanted device, pocket revision, and insertion of a new device, and intraoperative defibrillation threshold testing Caryl Comes)  . CARDIAC DEFIBRILLATOR REMOVAL  2004   Defibrillator change Caryl Comes) 3 times changed  . HEMORRHOID SURGERY    . ICD    . TONSILLECTOMY      FAMILY HISTORY: Family History  Problem Relation Age of Onset  . Hypertension Mother   . Heart disease Father   . Diverticulosis Father   . Coronary artery disease Father   . Alopecia Father   . Prostate cancer Brother   . Lung cancer Paternal Grandfather        Smoker  . Cancer Paternal Grandfather        lung cancer - smoker  . Diabetes Brother   . Heart disease Brother        heart transplant  . Colon cancer Neg Hx     SOCIAL HISTORY: Social History   Socioeconomic History  . Marital status: Married    Spouse name: [name]  . Number of children: 2  . Years of education: College  . Highest education level: Not on file  Occupational History    Employer: DISABLED  Tobacco Use  . Smoking status: Former Smoker    Packs/day: 1.00    Years: 10.00    Pack years: 10.00    Types: Cigarettes    Quit date: 07/06/1995    Years since quitting: 25.2  . Smokeless tobacco: Former Systems developer    Types: Chew    Quit date: 11/04/1994  Vaping Use  . Vaping Use: Never used  Substance and Sexual  Activity  . Alcohol use: No    Alcohol/week: 0.0 standard drinks    Comment: socially  . Drug use: No  . Sexual activity: Not on file  Other Topics Concern  . Not on file  Social History  Narrative   Patient is married [name]) [phone] with 2 daughters   5 grandchildren   Daily caffeine use - one cup of coffee and one-two cokes per day   Patient is right-handed.   Patient has a college education   Social Determinants of Radio broadcast assistant Strain: Not on file  Food Insecurity: Not on file  Transportation Needs: Not on file  Physical Activity: Not on file  Stress: Not on file  Social Connections: Not on file  Intimate Partner Violence: Not on file   PHYSICAL EXAM  Vitals:   09/30/20 1533  BP: (!) 142/84  Pulse: 62  Weight: 183 lb 8 oz (83.2 kg)  Height: 5' 9"  (1.753 m)   Body mass index is 27.1 kg/m.  Generalized: Well developed, in no acute distress  MMSE - Mini Mental State Exam 12/11/2019 08/29/2019 06/12/2019  Orientation to time 5 5 5   Orientation to Place 5 5 5   Registration 3 3 3   Attention/ Calculation 5 5 1   Recall 1 3 3   Recall-comments - - -  Language- name 2 objects 2 - 2  Language- repeat 1 1 1   Language- follow 3 step command 3 - 3  Language- read & follow direction 1 - 1  Write a sentence 1 - 1  Copy design 1 - 1  Copy design-comments 12 animals - named 15 animals  Total score 28 - 26   NEUROLOGICAL EXAM:  MENTAL STATUS: Speech/Cognition: Awake, alert, normal speech, oriented to history taking and casual conversation.  CRANIAL NERVES: CN II: Visual fields are full to confrontation.  Pupils are small, reactive CN III, IV, VI: extraocular movement are normal. No ptosis. CN V: Facial sensation is intact to light touch. CN VII: Face is symmetric with normal eye closure and smile. CN VIII: Hearing is normal to casual conversation CN IX, X: Palate elevates symmetrically. Phonation is normal. CN XI: Head turning and shoulder shrug are  intact  MOTOR: Fixation left upper extremity on rapid rotating movement  REFLEXES: Reflexes are 2  and symmetric at the biceps, triceps, knees and ankles. Plantar responses are flexor.  SENSORY: Intact to light touch, pinprick, positional and vibratory sensation at fingers and toes.  COORDINATION: There is no trunk or limb ataxia.    GAIT/STANCE: He needs push-up to get up from seated position, tends to hold his left arm in elbow flexion, mildly wide-based, unsteady  DIAGNOSTIC DATA (LABS, IMAGING, TESTING) - I reviewed patient records, labs, notes, testing and imaging myself where available.  Lab Results  Component Value Date   WBC 8.7 09/28/2020   HGB 13.9 09/28/2020   HCT 40.8 09/28/2020   MCV 92.1 09/28/2020   PLT 319 09/28/2020      Component Value Date/Time   NA 141 09/28/2020 1534   NA 140 06/12/2019 1015   K 3.7 09/28/2020 1534   CL 107 09/28/2020 1534   CO2 28 09/28/2020 1534   GLUCOSE 140 (H) 09/28/2020 1534   BUN 17 09/28/2020 1534   BUN 13 06/12/2019 1015   CREATININE 0.89 09/28/2020 1534   CREATININE 1.07 12/23/2017 1451   CALCIUM 9.2 09/28/2020 1534   PROT 6.6 06/12/2019 1015   ALBUMIN 4.4 06/12/2019 1015   AST 32 06/12/2019 1015   ALT 40 06/12/2019 1015   ALKPHOS 102 06/12/2019 1015   BILITOT 0.3 06/12/2019 1015   GFRNONAA >60 09/28/2020 1534   GFRAA 101 06/12/2019 1015   Lab Results  Component Value Date   CHOL 190  08/29/2019   HDL 48.80 08/29/2019   LDLCALC 106 (H) 08/29/2019   LDLDIRECT 127.6 03/05/2014   TRIG 176.0 (H) 08/29/2019   CHOLHDL 4 08/29/2019   Lab Results  Component Value Date   HGBA1C 5.9 (H) 08/29/2019   No results found for: [id number] Lab Results  Component Value Date   TSH 6.29 (H) 08/29/2019      ASSESSMENT AND PLAN 70 y.o. year old male    Anoxic brain injury in 1999 due to cardiac arrest,  With residual mild cognitive impairment  Slow worsening over the years  Continue Aricept 10 mg daily, Namenda 10 mg  twice a day  Encouraged moderate exercise  Seizure  Most recent in June 2019, EEG was normal  Continue lamotrigine 100 mg twice a day  Gait abnormality  Also related to the previous anoxic brain injury, deconditioning,  Headache  Description of headache most consistent with ice prick headache, suspicious he might have underlying migraine  Continue current medication, as needed NSAIDs,  Marcial Pacas, M.D. Ph.D.  [facility] [place], [place] [address] Phone: [phone] Fax:      [phone]

## 2020-09-30 NOTE — Patient Instructions (Signed)
[email or web address]   Ice Pick Headache (Primary Stabbing Headache)

## 2020-10-01 ENCOUNTER — Telehealth: Payer: Self-pay | Admitting: Neurology

## 2020-10-01 DIAGNOSIS — R519 Headache, unspecified: Secondary | ICD-10-CM

## 2020-10-01 DIAGNOSIS — G931 Anoxic brain damage, not elsewhere classified: Secondary | ICD-10-CM

## 2020-10-01 NOTE — Telephone Encounter (Signed)
Please call patient: Laboratory evaluation showed slight elevation of C-reactive protein, in the setting of normal ESR, above findings has unknown clinical significance  Also check with him to make sure he does not have recurrent headaches, back to baseline

## 2020-10-01 NOTE — Telephone Encounter (Signed)
I provided the lab results to the patient and his wife. Reports a total of three episodes of a sudden, sharp pain lasting on seconds this week ( 09/27/20, 09/28/20, 10/01/20). Dr. Krista Blue had reviewed ice pick headaches with him. He is going to keep his follow up in June. I have asked him to keep a headache log at home, including the location and duration of pain, along with his activity at the time of onset. If they worsen, I have asked him to call our office back prior to his follow up. He understands that sometimes a daily medication is added if the headaches are too bothersome. Rescue medications do not have an opportunity to work due to the brevity of the pain.

## 2020-10-02 ENCOUNTER — Other Ambulatory Visit (INDEPENDENT_AMBULATORY_CARE_PROVIDER_SITE_OTHER): Payer: Self-pay

## 2020-10-02 DIAGNOSIS — Z0289 Encounter for other administrative examinations: Secondary | ICD-10-CM

## 2020-10-02 DIAGNOSIS — E7849 Other hyperlipidemia: Secondary | ICD-10-CM | POA: Diagnosis not present

## 2020-10-02 DIAGNOSIS — G931 Anoxic brain damage, not elsewhere classified: Secondary | ICD-10-CM

## 2020-10-02 DIAGNOSIS — E039 Hypothyroidism, unspecified: Secondary | ICD-10-CM | POA: Diagnosis not present

## 2020-10-02 DIAGNOSIS — R519 Headache, unspecified: Secondary | ICD-10-CM

## 2020-10-02 DIAGNOSIS — K219 Gastro-esophageal reflux disease without esophagitis: Secondary | ICD-10-CM | POA: Diagnosis not present

## 2020-10-02 NOTE — Telephone Encounter (Signed)
I talked with his wife, his ophthalmologist and his C-reactive protein 1.9, have suggested him prednisone 60 mg daily, temporal artery biopsy  Patient still has intermittent transient ice prick pain, lasting few seconds, no new focal symptoms, no jaw claudication, no body achy pain  We will repeat C-reactive protein, ESR, also high-sensitivity C-reactive protein

## 2020-10-02 NOTE — Addendum Note (Signed)
Addended by: Marcial Pacas on: 10/02/2020 12:11 PM   Modules accepted: Orders

## 2020-10-03 ENCOUNTER — Other Ambulatory Visit: Payer: Self-pay

## 2020-10-03 ENCOUNTER — Ambulatory Visit (INDEPENDENT_AMBULATORY_CARE_PROVIDER_SITE_OTHER): Payer: Medicare Other

## 2020-10-03 ENCOUNTER — Other Ambulatory Visit (INDEPENDENT_AMBULATORY_CARE_PROVIDER_SITE_OTHER): Payer: Medicare Other

## 2020-10-03 ENCOUNTER — Telehealth: Payer: Self-pay | Admitting: Neurology

## 2020-10-03 DIAGNOSIS — Z0000 Encounter for general adult medical examination without abnormal findings: Secondary | ICD-10-CM

## 2020-10-03 DIAGNOSIS — Z125 Encounter for screening for malignant neoplasm of prostate: Secondary | ICD-10-CM | POA: Diagnosis not present

## 2020-10-03 DIAGNOSIS — I10 Essential (primary) hypertension: Secondary | ICD-10-CM | POA: Diagnosis not present

## 2020-10-03 DIAGNOSIS — R739 Hyperglycemia, unspecified: Secondary | ICD-10-CM

## 2020-10-03 LAB — COMPREHENSIVE METABOLIC PANEL
ALT: 30 U/L (ref 0–53)
AST: 23 U/L (ref 0–37)
Albumin: 4.5 g/dL (ref 3.5–5.2)
Alkaline Phosphatase: 72 U/L (ref 39–117)
BUN: 13 mg/dL (ref 6–23)
CO2: 32 mEq/L (ref 19–32)
Calcium: 9.8 mg/dL (ref 8.4–10.5)
Chloride: 102 mEq/L (ref 96–112)
Creatinine, Ser: 1.08 mg/dL (ref 0.40–1.50)
GFR: 70.65 mL/min (ref >60.00)
Glucose, Bld: 99 mg/dL (ref 70–99)
Potassium: 4.1 mEq/L (ref 3.5–5.1)
Sodium: 141 mEq/L (ref 135–145)
Total Bilirubin: 0.6 mg/dL (ref 0.2–1.2)
Total Protein: 6.9 g/dL (ref 6.0–8.3)

## 2020-10-03 LAB — LIPID PANEL
Cholesterol: 207 mg/dL — ABNORMAL HIGH (ref 0–200)
HDL: 62 mg/dL (ref >39.00)
LDL Cholesterol: 119 mg/dL — ABNORMAL HIGH (ref 0–99)
NonHDL: 144.89
Total CHOL/HDL Ratio: 3
Triglycerides: 129 mg/dL (ref 0.0–149.0)
VLDL: 25.8 mg/dL (ref 0.0–40.0)

## 2020-10-03 LAB — CBC WITH DIFFERENTIAL/PLATELET
Basophils Absolute: 0.1 10*3/uL (ref 0.0–0.1)
Basophils Relative: 1 % (ref 0.0–3.0)
Eosinophils Absolute: 0.2 10*3/uL (ref 0.0–0.7)
Eosinophils Relative: 2.7 % (ref 0.0–5.0)
HCT: 43.7 % (ref 39.0–52.0)
Hemoglobin: 14.9 g/dL (ref 13.0–17.0)
Lymphocytes Relative: 39.5 % (ref 12.0–46.0)
Lymphs Abs: 3.3 10*3/uL (ref 0.7–4.0)
MCHC: 34.2 g/dL (ref 30.0–36.0)
MCV: 92.4 fl (ref 78.0–100.0)
Monocytes Absolute: 0.8 10*3/uL (ref 0.1–1.0)
Monocytes Relative: 9.3 % (ref 3.0–12.0)
Neutro Abs: 4 10*3/uL (ref 1.4–7.7)
Neutrophils Relative %: 47.5 % (ref 43.0–77.0)
Platelets: 337 10*3/uL (ref 150.0–400.0)
RBC: 4.73 Mil/uL (ref 4.22–5.81)
RDW: 12.8 % (ref 11.5–15.5)
WBC: 8.5 10*3/uL (ref 4.0–10.5)

## 2020-10-03 LAB — TSH: TSH: 12.5 u[IU]/mL — ABNORMAL HIGH (ref 0.35–4.50)

## 2020-10-03 LAB — PSA, MEDICARE: PSA: 0.29 ng/ml (ref 0.10–4.00)

## 2020-10-03 LAB — HEMOGLOBIN A1C: Hgb A1c MFr Bld: 6 % (ref 4.6–6.5)

## 2020-10-03 LAB — HIGH SENSITIVITY CRP: CRP, High Sensitivity: 1.42 mg/L (ref 0.00–3.00)

## 2020-10-03 LAB — C-REACTIVE PROTEIN: CRP: 1 mg/L (ref 0–10)

## 2020-10-03 LAB — SEDIMENTATION RATE: Sed Rate: 3 mm/hr (ref 0–30)

## 2020-10-03 NOTE — Patient Instructions (Signed)
Larry [name] , Thank you for taking time to come for your Medicare Wellness Visit. I appreciate your ongoing commitment to your health goals. Please review the following plan we discussed and let me know if I can assist you in the future.   Screening recommendations/referrals: Colonoscopy: Up to date, completed 11/24/2015, due 11/2025 Recommended yearly ophthalmology/optometry visit for glaucoma screening and checkup Recommended yearly dental visit for hygiene and checkup  Vaccinations: Influenza vaccine: Up to date, completed 04/12/2020, due 02/2021 Pneumococcal vaccine: Completed series Tdap vaccine: decline-insurance Shingles vaccine: due, check with your insurance regarding coverage if interested    Covid-19: Completed 2 vaccines, booster is due, Please bring card to physical so we can document dates in your chart  Advanced directives: Advance directive discussed with you today. I have provided a copy for you to complete at home and have notarized. Once this is complete please bring a copy in to our office so we can scan it into your chart.  Conditions/risks identified: hypertension  Next appointment: Follow up in one year for your annual wellness visit.   Preventive Care 18 Years and Older, Male Preventive care refers to lifestyle choices and visits with your health care provider that can promote health and wellness. What does preventive care include?  A yearly physical exam. This is also called an annual well check.  Dental exams once or twice a year.  Routine eye exams. Ask your health care provider how often you should have your eyes checked.  Personal lifestyle choices, including:  Daily care of your teeth and gums.  Regular physical activity.  Eating a healthy diet.  Avoiding tobacco and drug use.  Limiting alcohol use.  Practicing safe sex.  Taking low doses of aspirin every day.  Taking vitamin and mineral supplements as recommended by your health care  provider. What happens during an annual well check? The services and screenings done by your health care provider during your annual well check will depend on your age, overall health, lifestyle risk factors, and family history of disease. Counseling  Your health care provider may ask you questions about your:  Alcohol use.  Tobacco use.  Drug use.  Emotional well-being.  Home and relationship well-being.  Sexual activity.  Eating habits.  History of falls.  Memory and ability to understand (cognition).  Work and work Statistician. Screening  You may have the following tests or measurements:  Height, weight, and BMI.  Blood pressure.  Lipid and cholesterol levels. These may be checked every 5 years, or more frequently if you are over 47 years old.  Skin check.  Lung cancer screening. You may have this screening every year starting at age 77 if you have a 30-pack-year history of smoking and currently smoke or have quit within the past 15 years.  Fecal occult blood test (FOBT) of the stool. You may have this test every year starting at age 22.  Flexible sigmoidoscopy or colonoscopy. You may have a sigmoidoscopy every 5 years or a colonoscopy every 10 years starting at age 39.  Prostate cancer screening. Recommendations will vary depending on your family history and other risks.  Hepatitis C blood test.  Hepatitis B blood test.  Sexually transmitted disease (STD) testing.  Diabetes screening. This is done by checking your blood sugar (glucose) after you have not eaten for a while (fasting). You may have this done every 1-3 years.  Abdominal aortic aneurysm (AAA) screening. You may need this if you are a current or former smoker.  Osteoporosis. You may be screened starting at age 15 if you are at high risk. Talk with your health care provider about your test results, treatment options, and if necessary, the need for more tests. Vaccines  Your health care provider  may recommend certain vaccines, such as:  Influenza vaccine. This is recommended every year.  Tetanus, diphtheria, and acellular pertussis (Tdap, Td) vaccine. You may need a Td booster every 10 years.  Zoster vaccine. You may need this after age 76.  Pneumococcal 13-valent conjugate (PCV13) vaccine. One dose is recommended after age 39.  Pneumococcal polysaccharide (PPSV23) vaccine. One dose is recommended after age 66. Talk to your health care provider about which screenings and vaccines you need and how often you need them. This information is not intended to replace advice given to you by your health care provider. Make sure you discuss any questions you have with your health care provider. Document Released: 07/18/2015 Document Revised: 03/10/2016 Document Reviewed: 04/22/2015 Elsevier Interactive Patient Education  2017 [place] Prevention in the Home Falls can cause injuries. They can happen to people of all ages. There are many things you can do to make your home safe and to help prevent falls. What can I do on the outside of my home?  Regularly fix the edges of walkways and driveways and fix any cracks.  Remove anything that might make you trip as you walk through a door, such as a raised step or threshold.  Trim any bushes or trees on the path to your home.  Use bright outdoor lighting.  Clear any walking paths of anything that might make someone trip, such as rocks or tools.  Regularly check to see if handrails are loose or broken. Make sure that both sides of any steps have handrails.  Any raised decks and porches should have guardrails on the edges.  Have any leaves, snow, or ice cleared regularly.  Use sand or salt on walking paths during winter.  Clean up any spills in your garage right away. This includes oil or grease spills. What can I do in the bathroom?  Use night lights.  Install grab bars by the toilet and in the tub and shower. Do not use  towel bars as grab bars.  Use non-skid mats or decals in the tub or shower.  If you need to sit down in the shower, use a plastic, non-slip stool.  Keep the floor dry. Clean up any water that spills on the floor as soon as it happens.  Remove soap buildup in the tub or shower regularly.  Attach bath mats securely with double-sided non-slip rug tape.  Do not have throw rugs and other things on the floor that can make you trip. What can I do in the bedroom?  Use night lights.  Make sure that you have a light by your bed that is easy to reach.  Do not use any sheets or blankets that are too big for your bed. They should not hang down onto the floor.  Have a firm chair that has side arms. You can use this for support while you get dressed.  Do not have throw rugs and other things on the floor that can make you trip. What can I do in the kitchen?  Clean up any spills right away.  Avoid walking on wet floors.  Keep items that you use a lot in easy-to-reach places.  If you need to reach something above you, use a strong step  stool that has a grab bar.  Keep electrical cords out of the way.  Do not use floor polish or wax that makes floors slippery. If you must use wax, use non-skid floor wax.  Do not have throw rugs and other things on the floor that can make you trip. What can I do with my stairs?  Do not leave any items on the stairs.  Make sure that there are handrails on both sides of the stairs and use them. Fix handrails that are broken or loose. Make sure that handrails are as long as the stairways.  Check any carpeting to make sure that it is firmly attached to the stairs. Fix any carpet that is loose or worn.  Avoid having throw rugs at the top or bottom of the stairs. If you do have throw rugs, attach them to the floor with carpet tape.  Make sure that you have a light switch at the top of the stairs and the bottom of the stairs. If you do not have them, ask  someone to add them for you. What else can I do to help prevent falls?  Wear shoes that:  Do not have high heels.  Have rubber bottoms.  Are comfortable and fit you well.  Are closed at the toe. Do not wear sandals.  If you use a stepladder:  Make sure that it is fully opened. Do not climb a closed stepladder.  Make sure that both sides of the stepladder are locked into place.  Ask someone to hold it for you, if possible.  Clearly mark and make sure that you can see:  Any grab bars or handrails.  First and last steps.  Where the edge of each step is.  Use tools that help you move around (mobility aids) if they are needed. These include:  Canes.  Walkers.  Scooters.  Crutches.  Turn on the lights when you go into a dark area. Replace any light bulbs as soon as they burn out.  Set up your furniture so you have a clear path. Avoid moving your furniture around.  If any of your floors are uneven, fix them.  If there are any pets around you, be aware of where they are.  Review your medicines with your doctor. Some medicines can make you feel dizzy. This can increase your chance of falling. Ask your doctor what other things that you can do to help prevent falls. This information is not intended to replace advice given to you by your health care provider. Make sure you discuss any questions you have with your health care provider. Document Released: 04/17/2009 Document Revised: 11/27/2015 Document Reviewed: 07/26/2014 Elsevier Interactive Patient Education  2017 [employer].

## 2020-10-03 NOTE — Progress Notes (Signed)
Subjective:   Larry [name] is a 70 y.o. male who presents for Medicare Annual/Subsequent preventive examination.  Review of Systems: N/A      I connected with the patient today by telephone and verified that I am speaking with the correct person using two identifiers. Location patient: home Location nurse: work Persons participating in the telephone visit: patient, nurse.   I discussed the limitations, risks, security and privacy concerns of performing an evaluation and management service by telephone and the availability of in person appointments. I also discussed with the patient that there may be a patient responsible charge related to this service. The patient expressed understanding and verbally consented to this telephonic visit.        Cardiac Risk Factors include: advanced age (>52men, >75 women);male gender;hypertension     Objective:    Today's Vitals   10/03/20 1437  PainSc: 0-No pain   There is no height or weight on file to calculate BMI.  Advanced Directives 10/03/2020 09/28/2020 08/29/2019 08/03/2017  Does Patient Have a Medical Advance Directive? No Yes Yes No  Type of Advance Directive - Living will [place];Living will -  Copy of [place] in Chart? - - No - copy requested -  Would patient like information on creating a medical advance directive? Yes (MAU/Ambulatory/Procedural Areas - Information given) - - Yes (MAU/Ambulatory/Procedural Areas - Information given)    Current Medications (verified) Outpatient Encounter Medications as of 10/03/2020  Medication Sig  . amphetamine-dextroamphetamine (ADDERALL) 10 MG tablet Take 1 tablet (10 mg total) by mouth 2 (two) times daily.  . Ascorbic Acid (VITAMIN C) 500 MG tablet Take 500 mg by mouth daily.  Marland Kitchen aspirin 81 MG tablet Take 81 mg by mouth daily.  Marland Kitchen diltiazem (CARTIA XT) 120 MG 24 hr capsule TAKE 1 CAPSULE BY MOUTH  DAILY.  Marland Kitchen donepezil (ARICEPT) 10 MG tablet TAKE 1 TABLET  BY MOUTH  DAILY  . fexofenadine (ALLEGRA) 180 MG tablet Take 180 mg by mouth daily.  . Ginkgo Biloba 40 MG TABS Take by mouth as directed.  . lamoTRIgine (LAMICTAL) 100 MG tablet TAKE 1 TABLET BY MOUTH  TWICE DAILY  . levothyroxine (SYNTHROID) 112 MCG tablet TAKE 1 TABLET BY MOUTH  DAILY BEFORE BREAKFAST  . Melatonin 5 MG CAPS Take by mouth as directed.  . memantine (NAMENDA) 10 MG tablet TAKE 1 TABLET BY MOUTH  TWICE DAILY  . metoprolol succinate (TOPROL XL) 25 MG 24 hr tablet Take 1 tablet (25 mg total) by mouth daily.  . montelukast (SINGULAIR) 10 MG tablet TAKE 1 TABLET BY MOUTH  DAILY  . Multiple Vitamin (MULTIVITAMIN) tablet Take 1 tablet by mouth daily.  . NON FORMULARY Focus factor 1 tab po bid  . rosuvastatin (CRESTOR) 20 MG tablet TAKE 1 TABLET BY MOUTH AT  BEDTIME   No facility-administered encounter medications on file as of 10/03/2020.    Allergies (verified) Penicillins   History: Past Medical History:  Diagnosis Date  . ADD (attention deficit disorder)   . Allergy    seasonal flares of perinneal allergies  . Cancer ([place])    melanoma  . Cardiac arrest ([place]) 07/1997   aborted  . Dementia ([place])    post resusitative  . Depression   . Diverticulosis   . Family hx of prostate cancer   . GERD (gastroesophageal reflux disease)   . Headache(784.0)   . Hyperlipidemia   . Hypothyroidism   . ICD (implantable cardiac defibrillator) in  place   . Idiopathic urticaria   . Internal hemorrhoids   . Low back pain syndrome   . Melanoma in situ of skin of trunk (H[facility])    chest  . PVC (premature ventricular contraction)    associated with cardiac arrest  . Sleep apnea    no c-pap  . Tubular adenoma of colon 10/2010   Past Surgical History:  Procedure Laterality Date  .  implantation x2    . CARDIAC DEFIBRILLATOR PLACEMENT  07/01/2010   Explanation of a previously implanted device, pocket revision, and insertion of a new device, and intraoperative defibrillation threshold  testing Caryl Comes)  . CARDIAC DEFIBRILLATOR REMOVAL  2004   Defibrillator change Caryl Comes) 3 times changed  . HEMORRHOID SURGERY    . ICD    . TONSILLECTOMY     Family History  Problem Relation Age of Onset  . Hypertension Mother   . Heart disease Father   . Diverticulosis Father   . Coronary artery disease Father   . Alopecia Father   . Prostate cancer Brother   . Lung cancer Paternal Grandfather        Smoker  . Cancer Paternal Grandfather        lung cancer - smoker  . Diabetes Brother   . Heart disease Brother        heart transplant  . Colon cancer Neg Hx    Social History   Socioeconomic History  . Marital status: Married    Spouse name: [name]  . Number of children: 2  . Years of education: College  . Highest education level: Not on file  Occupational History    Employer: DISABLED  Tobacco Use  . Smoking status: Former Smoker    Packs/day: 1.00    Years: 10.00    Pack years: 10.00    Types: Cigarettes    Quit date: 07/06/1995    Years since quitting: 25.2  . Smokeless tobacco: Former Systems developer    Types: Chew    Quit date: 11/04/1994  Vaping Use  . Vaping Use: Never used  Substance and Sexual Activity  . Alcohol use: No    Alcohol/week: 0.0 standard drinks    Comment: socially  . Drug use: No  . Sexual activity: Not on file  Other Topics Concern  . Not on file  Social History Narrative   Patient is married [name]) [phone] with 2 daughters   5 grandchildren   Daily caffeine use - one cup of coffee and one-two cokes per day   Patient is right-handed.   Patient has a college education   Social Determinants of Radio broadcast assistant Strain: Low Risk   . Difficulty of Paying Living Expenses: Not hard at all  Food Insecurity: No Food Insecurity  . Worried About Charity fundraiser in the Last Year: Never true  . Ran Out of Food in the Last Year: Never true  Transportation Needs: No Transportation Needs  . Lack of Transportation (Medical): No  . Lack of  Transportation (Non-Medical): No  Physical Activity: Inactive  . Days of Exercise per Week: 0 days  . Minutes of Exercise per Session: 0 min  Stress: No Stress Concern Present  . Feeling of Stress : Not at all  Social Connections: Not on file    Tobacco Counseling Counseling given: Not Answered   Clinical Intake:  Pre-visit preparation completed: Yes  Pain : No/denies pain Pain Score: 0-No pain     Nutritional Risks: None Diabetes: No  How often do you need to have someone help you when you read instructions, pamphlets, or other written materials from your doctor or pharmacy?: 1 - Never What is the last grade level you completed in school?: some college  Diabetic: No Nutrition Risk Assessment:  Has the patient had any N/V/D within the last 2 months?  No  Does the patient have any non-healing wounds?  No  Has the patient had any unintentional weight loss or weight gain?  No   Diabetes:  Is the patient diabetic?  No  If diabetic, was a CBG obtained today?  N/A Did the patient bring in their glucometer from home?  N/A How often do you monitor your CBG's? N/A.   Financial Strains and Diabetes Management:  Are you having any financial strains with the device, your supplies or your medication? N/A.  Does the patient want to be seen by Chronic Care Management for management of their diabetes?  N/A Would the patient like to be referred to a Nutritionist or for Diabetic Management?  N/A  Interpreter Needed?: No  Information entered by :: CJohnson, LPN   Activities of Daily Living In your present state of health, do you have any difficulty performing the following activities: 10/03/2020  Hearing? N  Vision? N  Difficulty concentrating or making decisions? N  Walking or climbing stairs? N  Dressing or bathing? N  Doing errands, shopping? N  Preparing Food and eating ? N  Using the Toilet? N  In the past six months, have you accidently leaked urine? N  Do you have  problems with loss of bowel control? N  Managing your Medications? N  Managing your Finances? N  Housekeeping or managing your Housekeeping? N  Some recent data might be hidden    Patient Care Team: [name], MD as PCP - General (Family Medicine) Deboraha Sprang, MD as PCP - Electrophysiology (Cardiology) Sherren Mocha, MD as PCP - Cardiology (Cardiology) Leandrew Koyanagi, MD as Referring Physician (Ophthalmology)  Indicate any recent Medical Services you may have received from other than [facility] providers in the past year (date may be approximate).     Assessment:   This is a routine wellness examination for Larry [name]  Hearing/Vision screen  Hearing Screening   125Hz  250Hz  500Hz  1000Hz  2000Hz  3000Hz  4000Hz  6000Hz  8000Hz   Right ear:           Left ear:           Vision Screening Comments: Patient gets annual eye exams   Dietary issues and exercise activities discussed: Current Exercise Habits: The patient does not participate in regular exercise at present, Exercise limited by: None identified  Goals    . Increase physical activity     Starting 08/03/2017, I will continue to walk at least 30 minutes daily and to use [employer] as tolerated.     . Patient Stated     08/29/2019, I will start exercising more daily for about 30 minutes.    . Patient Stated     10/04/2018, I will maintain and continue medications as prescribed.       Depression Screen PHQ 2/9 Scores 10/03/2020 08/29/2019 08/14/2018 08/03/2017 05/27/2015 03/07/2014 02/15/2013  PHQ - 2 Score 0 0 0 0 0 0 2  PHQ- 9 Score 0 0 - 0 - - -    Fall Risk Fall Risk  10/03/2020 08/29/2019 08/14/2018 08/03/2017 05/27/2015  Falls in the past year? 0 0 0 No No  Number falls in past yr:  0 0 - - -  Injury with Fall? 0 0 - - -  Risk for fall due to : Medication side effect Medication side effect - - -  Follow up Falls evaluation completed;Falls prevention discussed Falls evaluation completed;Falls prevention discussed - - -     FALL RISK PREVENTION PERTAINING TO THE HOME:  Any stairs in or around the home? Yes  If so, are there any without handrails? No  Home free of loose throw rugs in walkways, pet beds, electrical cords, etc? Yes  Adequate lighting in your home to reduce risk of falls? Yes   ASSISTIVE DEVICES UTILIZED TO PREVENT FALLS:  Life alert? No  Use of a cane, walker or w/c? No  Grab bars in the bathroom? No  Shower chair or bench in shower? No  Elevated toilet seat or a handicapped toilet? No   TIMED UP AND GO:  Was the test performed? N/A telephone visit .    Cognitive Function: MMSE - Mini Mental State Exam 10/03/2020 12/11/2019 08/29/2019 06/12/2019 03/02/2018  Not completed: Refused - - - -  Orientation to time - 5 5 5 4   Orientation to Place - 5 5 5 5   Registration - 3 3 3 3   Attention/ Calculation - 5 5 1 5   Recall - 1 3 3 1   Recall-comments - - - - -  Language- name 2 objects - 2 - 2 2  Language- repeat - 1 1 1 1   Language- follow 3 step command - 3 - 3 3  Language- read & follow direction - 1 - 1 1  Write a sentence - 1 - 1 1  Copy design - 1 - 1 1  Copy design-comments - 12 animals - named 15 animals -  Total score - 28 - 26 27  Mini Cog  Mini-Cog screen was completed. Maximum score is 22. A value of 0 denotes this part of the MMSE was not completed or the patient failed this part of the Mini-Cog screening.       Immunizations Immunization History  Administered Date(s) Administered  . Fluad Quad(high Dose 65+) 04/10/2019, 04/12/2020  . Influenza Whole 05/01/2013  . Influenza,inj,Quad PF,6+ Mos 05/27/2015, 06/01/2016, 04/19/2017, 04/20/2018  . Pneumococcal Conjugate-13 08/03/2017  . Pneumococcal Polysaccharide-23 08/14/2018  . Td 01/02/1997, 11/20/2008    TDAP status: Due, Education has been provided regarding the importance of this vaccine. Advised may receive this vaccine at local pharmacy or Health Dept. Aware to provide a copy of the vaccination record if obtained  from local pharmacy or Health Dept. Verbalized acceptance and understanding.  Flu Vaccine status: Up to date  Pneumococcal vaccine status: Up to date  Covid-19 vaccine status: Completed 2 vaccines, Patient aware booster due.  Qualifies for Shingles Vaccine? Yes   Zostavax completed No   Shingrix Completed?: No.    Education has been provided regarding the importance of this vaccine. Patient has been advised to call insurance company to determine out of pocket expense if they have not yet received this vaccine. Advised may also receive vaccine at local pharmacy or Health Dept. Verbalized acceptance and understanding.  Screening Tests Health Maintenance  Topic Date Due  . COVID-19 Vaccine (1) Never done  . TETANUS/TDAP  11/21/2018  . COLONOSCOPY (Pts 45-41yrs Insurance coverage will need to be confirmed)  11/23/2020  . INFLUENZA VACCINE  02/02/2021  . Hepatitis C Screening  Completed  . PNA vac Low Risk Adult  Completed  . HPV VACCINES  Aged Out  Health Maintenance  Health Maintenance Due  Topic Date Due  . COVID-19 Vaccine (1) Never done  . TETANUS/TDAP  11/21/2018    Colorectal cancer screening: Type of screening: Colonoscopy. Completed 11/24/2015. Repeat every 5 years  Lung Cancer Screening: (Low Dose CT Chest recommended if Age 46-80 years, 30 pack-year currently smoking OR have quit w/in 15years.) does not qualify.    Additional Screening:  Hepatitis C Screening: does qualify; Completed 05/26/2016  Vision Screening: Recommended annual ophthalmology exams for early detection of glaucoma and other disorders of the eye. Is the patient up to date with their annual eye exam?  Yes  Who is the provider or what is the name of the office in which the patient attends annual eye exams? Dr. Rick Duff, [place] If pt is not established with a provider, would they like to be referred to a provider to establish care? No .   Dental Screening: Recommended annual dental exams for  proper oral hygiene  Community Resource Referral / Chronic Care Management: CRR required this visit?  No   CCM required this visit?  No      Plan:     I have personally reviewed and noted the following in the patient's chart:   . Medical and social history . Use of alcohol, tobacco or illicit drugs  . Current medications and supplements . Functional ability and status . Nutritional status . Physical activity . Advanced directives . List of other physicians . Hospitalizations, surgeries, and ER visits in previous 12 months . Vitals . Screenings to include cognitive, depression, and falls . Referrals and appointments  In addition, I have reviewed and discussed with patient certain preventive protocols, quality metrics, and best practice recommendations. A written personalized care plan for preventive services as well as general preventive health recommendations were provided to patient.   Due to this being a telephonic visit, the after visit summary with patients personalized plan was offered to patient via office or my-chart. Patient preferred to pick up at office at next visit or via mychart.   Andrez Grime, LPN   09/07/4654

## 2020-10-03 NOTE — Telephone Encounter (Signed)
I have called patient and his wife, repeat ESR, high sensitive C-reactive protein and C-reactive protein were all within normal limit from laboratory evaluation on October 02, 2020  Patient is overall doing well, complains of intermittent left side blurry vision  I will forward the laboratory evaluation to his ophthalmologist      Leandrew Koyanagi, MD Referring Physician (Ophthalmology) Phone: [phone] Fax: [phone]  [address] [place] [address] 10/03/2020 - -   I do not think he has temporal arteritis, would not suggest steroid treatment.temporal artery biopsy, his headache is most consistent with ice pick headache.  May continue follow-up with ophthalmologist for his vision complaints,

## 2020-10-03 NOTE — Progress Notes (Signed)
PCP notes:  Health Maintenance: Tdap- insurance   Abnormal Screenings: none   Patient concerns: none   Nurse concerns: none   Next PCP appt: 10/07/2020 @ 3:30 pm

## 2020-10-07 ENCOUNTER — Other Ambulatory Visit: Payer: Self-pay

## 2020-10-07 ENCOUNTER — Ambulatory Visit (INDEPENDENT_AMBULATORY_CARE_PROVIDER_SITE_OTHER): Payer: Medicare Other | Admitting: Family Medicine

## 2020-10-07 ENCOUNTER — Encounter: Payer: Self-pay | Admitting: Family Medicine

## 2020-10-07 VITALS — BP 140/82 | HR 73 | Temp 98.2°F | Ht 69.0 in | Wt 182.0 lb

## 2020-10-07 DIAGNOSIS — I10 Essential (primary) hypertension: Secondary | ICD-10-CM

## 2020-10-07 DIAGNOSIS — Z0000 Encounter for general adult medical examination without abnormal findings: Secondary | ICD-10-CM

## 2020-10-07 DIAGNOSIS — R413 Other amnesia: Secondary | ICD-10-CM | POA: Diagnosis not present

## 2020-10-07 DIAGNOSIS — E7849 Other hyperlipidemia: Secondary | ICD-10-CM | POA: Diagnosis not present

## 2020-10-07 DIAGNOSIS — E039 Hypothyroidism, unspecified: Secondary | ICD-10-CM | POA: Diagnosis not present

## 2020-10-07 DIAGNOSIS — T7840XS Allergy, unspecified, sequela: Secondary | ICD-10-CM | POA: Diagnosis not present

## 2020-10-07 DIAGNOSIS — Z7189 Other specified counseling: Secondary | ICD-10-CM

## 2020-10-07 MED ORDER — LEVOTHYROXINE SODIUM 125 MCG PO TABS: 125.0000 ug | ORAL_TABLET | Freq: Every day | ORAL | 3 refills | Status: DC

## 2020-10-07 MED ORDER — ROSUVASTATIN CALCIUM 20 MG PO TABS
20.0000 mg | ORAL_TABLET | Freq: Every day | ORAL | 3 refills | Status: DC
Start: 2020-10-07 — End: 2021-08-24

## 2020-10-07 NOTE — Progress Notes (Signed)
This visit occurred during the SARS-CoV-2 public health emergency.  Safety protocols were in place, including screening questions prior to the visit, additional usage of staff PPE, and extensive cleaning of exam room while observing appropriate contact time as indicated for disinfecting solutions.  He had ER eval with neg CT venogram.  He had HA and L eye blurry vision.  Had eye clinic and neuro eval in the meantime.  There was the discussion of w/u for temporal arteritis.  Recheck CRP and ESR wnl.  He doesn't have HA now.  He'll update Dr. Krista Blue about his headaches if they recur.  I think it makes sense to patient to f/u with his eye doctor about blurry vision.  Discussed with patient and wife and they agree.  Memory/Attention.  He has had some changes in the past years but not all of a sudden.  He had neurology f/u.  He repeats some questions.  No ADE on meds.  Compliant.  The presumption is from both me and the patient that he had some benefit from Adderall donepezil and Namenda.  Seasonal allergies.  On singulair, likely with some relief.  Compliant.  Hypertension:    Using medication without problems or lightheadedness:  yes Chest pain with exertion:no Edema:no Short of breath:no  Elevated Cholesterol: Using medications without problems: yes Muscle aches: no Diet compliance: yes Exercise: yes  Hypothyroidism.  Fatigue noted.  No neck mass. Compliant.  TSH up, d/w pt.    Flu 2021 PNA up to date.   Tetanus 2010, d/w pt.   shingrix d/w pt.  covid vaccine prev done.   PSA wnl, d/w pt.  Colonoscopy 2017, discussed with patient about possible follow-up.  See follow-up phone note. Advance directive d/w pt. wife designated if patient were incapacitated  PMH and SH reviewed  ROS: Per HPI unless specifically indicated in ROS section   Meds, vitals, and allergies reviewed.   GEN: nad, alert and oriented HEENT: ncat NECK: supple w/o LA CV: rrr. PULM: ctab, no inc wob ABD: soft,  +bs EXT: no edema SKIN: no acute rash

## 2020-10-07 NOTE — Patient Instructions (Signed)
Check with your insurance to see if they will cover the shingles and tetanus shots.  Recheck TSh in about 2 months after a dose change.  Need a lab visit.  Take care.  Glad to see you.

## 2020-10-08 ENCOUNTER — Telehealth: Payer: Self-pay | Admitting: Family Medicine

## 2020-10-08 NOTE — Telephone Encounter (Signed)
Neither the patient nor I were particularly enthused about repeat colonoscopy at this point, given his history of memory loss/hypoxic brain injury and the possibility of anesthesia related to the colonoscopy.  I would like your input.  Many thanks.

## 2020-10-08 NOTE — Assessment & Plan Note (Signed)
Flu 2021 PNA up to date.   Tetanus 2010, d/w pt.   shingrix d/w pt.  covid vaccine prev done.   PSA wnl, d/w pt.  Colonoscopy 2017, discussed with patient about possible follow-up.  See follow-up phone note. Advance directive d/w pt. wife designated if patient were incapacitated

## 2020-10-08 NOTE — Assessment & Plan Note (Addendum)
Reasonable control, accounting for high HDL.  Continue Crestor.  Labs discussed with patient.  He will update me as needed.

## 2020-10-08 NOTE — Assessment & Plan Note (Signed)
Advance directive d/w pt. wife designated if patient were incapacitated

## 2020-10-08 NOTE — Assessment & Plan Note (Signed)
On singulair, likely with some relief.  Compliant.

## 2020-10-08 NOTE — Assessment & Plan Note (Signed)
He has had some changes in the past years but not all of a sudden.  He had neurology f/u.  He repeats some questions.  No ADE on meds.  Compliant.  The presumption is from both me and the patient that he had some benefit from Adderall donepezil and Namenda.  Would continue as is.  He agrees.

## 2020-10-08 NOTE — Assessment & Plan Note (Signed)
Fatigue noted.  No neck mass. Compliant.  TSH up, d/w pt.   would increase levothyroxine replacement to 125 mcg daily and recheck TSH in about 2 months.  He agrees.

## 2020-10-08 NOTE — Assessment & Plan Note (Signed)
Controlled.  Continue metoprolol and diltiazem.

## 2020-10-09 NOTE — Telephone Encounter (Signed)
Since his health status does not seem appropriate for an elective surveillance colonoscopy we will cancel his colonoscopy recall.

## 2020-10-09 NOTE — Telephone Encounter (Signed)
App GI input.  See below. Please update patient.  Would defer colonoscopy.  Thanks.

## 2020-10-09 NOTE — Telephone Encounter (Signed)
Notified patient that colonoscopy does not need to be done. Patient was very happy about this.

## 2020-10-10 ENCOUNTER — Ambulatory Visit: Payer: Medicare Other

## 2020-10-29 DIAGNOSIS — H9042 Sensorineural hearing loss, unilateral, left ear, with unrestricted hearing on the contralateral side: Secondary | ICD-10-CM | POA: Diagnosis not present

## 2020-10-29 DIAGNOSIS — H9313 Tinnitus, bilateral: Secondary | ICD-10-CM | POA: Diagnosis not present

## 2020-11-27 ENCOUNTER — Telehealth: Payer: Self-pay | Admitting: Family Medicine

## 2020-11-27 NOTE — Telephone Encounter (Signed)
  LAST APPOINTMENT DATE: 10/08/2020   NEXT APPOINTMENT DATE:@6 /12/2020  MEDICATION: adderall   PHARMACY: [facility]   Let patient know to contact pharmacy at the end of the day to make sure medication is ready.  Please notify patient to allow 48-72 hours to process  Encourage patient to contact the pharmacy for refills or they can request refills through [place]:   LAST REFILL:  QTY:  REFILL DATE:    OTHER COMMENTS:    Okay for refill?  Please advise

## 2020-11-27 NOTE — Telephone Encounter (Signed)
Last refilled 08/20/20 #180/0

## 2020-11-28 LAB — CUP PACEART REMOTE DEVICE CHECK
Battery Remaining Longevity: 36 mo
Battery Remaining Percentage: 42 %
Brady Statistic RV Percent Paced: 1 %
Date Time Interrogation Session: 20220412032100
HighPow Impedance: 64 Ohm
Implantable Lead Implant Date: 19990204
Implantable Lead Location: 753860
Implantable Lead Model: 135
Implantable Lead Serial Number: 301213
Implantable Pulse Generator Implant Date: 20111228
Lead Channel Impedance Value: 777 Ohm
Lead Channel Pacing Threshold Amplitude: 1.2 V
Lead Channel Pacing Threshold Pulse Width: 1 ms
Lead Channel Setting Pacing Amplitude: 2.4 V
Lead Channel Setting Pacing Pulse Width: 1 ms
Lead Channel Setting Sensing Sensitivity: 0.4 mV
Pulse Gen Serial Number: 277857

## 2020-11-28 MED ORDER — AMPHETAMINE-DEXTROAMPHETAMINE 10 MG PO TABS: 10.0000 mg | ORAL_TABLET | Freq: Two times a day (BID) | ORAL | 0 refills | Status: DC

## 2020-11-28 NOTE — Addendum Note (Signed)
Addended by: [name] on: 11/28/2020 07:43 AM   Modules accepted: Orders

## 2020-11-28 NOTE — Telephone Encounter (Signed)
Sent. Thanks.   

## 2020-12-08 ENCOUNTER — Other Ambulatory Visit: Payer: Self-pay

## 2020-12-08 ENCOUNTER — Other Ambulatory Visit (INDEPENDENT_AMBULATORY_CARE_PROVIDER_SITE_OTHER): Payer: Medicare Other

## 2020-12-08 DIAGNOSIS — E039 Hypothyroidism, unspecified: Secondary | ICD-10-CM | POA: Diagnosis not present

## 2020-12-08 LAB — TSH: TSH: 5.94 u[IU]/mL — ABNORMAL HIGH (ref 0.35–4.50)

## 2020-12-10 ENCOUNTER — Other Ambulatory Visit: Payer: Self-pay | Admitting: Family Medicine

## 2020-12-10 ENCOUNTER — Telehealth: Payer: Self-pay | Admitting: Family Medicine

## 2020-12-10 DIAGNOSIS — E039 Hypothyroidism, unspecified: Secondary | ICD-10-CM

## 2020-12-10 MED ORDER — LEVOTHYROXINE SODIUM 137 MCG PO TABS: 137.0000 ug | ORAL_TABLET | Freq: Every day | ORAL | 3 refills | Status: DC

## 2020-12-10 NOTE — Telephone Encounter (Signed)
Will address on result note 

## 2020-12-10 NOTE — Telephone Encounter (Signed)
[name] called in due to he received a mychart about his thyroid levels are elevated and he was given medicine for it but its still elevated and wanted to know if his medication needed to be changed.    Please advise

## 2020-12-16 ENCOUNTER — Other Ambulatory Visit: Payer: Self-pay

## 2020-12-16 ENCOUNTER — Telehealth: Payer: Self-pay

## 2020-12-16 ENCOUNTER — Ambulatory Visit: Payer: Medicare Other | Admitting: Neurology

## 2020-12-16 ENCOUNTER — Encounter: Payer: Self-pay | Admitting: Neurology

## 2020-12-16 VITALS — BP 152/88 | HR 78 | Ht 69.0 in | Wt 179.4 lb

## 2020-12-16 DIAGNOSIS — R519 Headache, unspecified: Secondary | ICD-10-CM | POA: Diagnosis not present

## 2020-12-16 DIAGNOSIS — R269 Unspecified abnormalities of gait and mobility: Secondary | ICD-10-CM | POA: Diagnosis not present

## 2020-12-16 DIAGNOSIS — R569 Unspecified convulsions: Secondary | ICD-10-CM

## 2020-12-16 DIAGNOSIS — R413 Other amnesia: Secondary | ICD-10-CM

## 2020-12-16 DIAGNOSIS — G931 Anoxic brain damage, not elsewhere classified: Secondary | ICD-10-CM

## 2020-12-16 DIAGNOSIS — E039 Hypothyroidism, unspecified: Secondary | ICD-10-CM

## 2020-12-16 DIAGNOSIS — R7982 Elevated C-reactive protein (CRP): Secondary | ICD-10-CM | POA: Diagnosis not present

## 2020-12-16 DIAGNOSIS — R799 Abnormal finding of blood chemistry, unspecified: Secondary | ICD-10-CM | POA: Diagnosis not present

## 2020-12-16 NOTE — Telephone Encounter (Signed)
Referral to PT sent to [facility]. P: [id number].

## 2020-12-16 NOTE — Progress Notes (Signed)
HISTORY OF PRESENT ILLNESS:Larry [name] is a very friendly 70 year old right-handed gentleman presents for followup consultation of his memory loss in the context of anoxic brain injury.    He is accompanied by his wife today. He was a patient of Dr. Erling Cruz, The patient has an underlying medical history of cardiac death from ventricular fibrillation with hypoxic brain injury in Jan 1999 due to arrythmia, 15 minutes without heart beat,  status post defibrillator placement, He stayed in hospital for 35 days, required prolonged rehabilitation, has to relearn walking again, significant amnesia, "as if that he did not have a past".   EEG showed diffuse slowing in January 1999, CT head and brain MRI were normal. CT head repeat in November 2000 showed slight prominence of the lateral and third ventricles. CT head in July 2003 showed prominence of the ventricular system. Psychological testing and August 1999 showed borderline performance IQ. He became depressed. In January 2014 his MMSE was 29, clock drawing was 4, animal fluency was 10.  He went to [facility] for 2 years for patient with degenerative disorder.   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 He lives at home with his wife, still driving.   He helps the household, he still has difficulty focusing.    He has had some fluctuation in his memory per wife. He has ADD and continues to take Adderall, 10 mg twice a day, without Adderall, he could not remember  the pages that he has read,he is also taking Aricept 10 mg every day, which has been very helpful too.   Up date April 14 2016YY   His overall doing very well, he likes play computer games, which has helped his eye and hands coordination, he had recent trip to [place], felt overwhelming, he continued to drive short distance,   He is taking Ritalin 10 mg twice a day, Aricept 10 mg once a day, complains of insomnia, he took  his last dose of Ritalin at evening time.   UPDATE Apr 21 2015:YY He is now taking Namenda 84m bid and aricept 131mqday, he still has trouble sleeping,  He watch movie, stay up late, he continues to have focus difficulty, tends to become agitated.   UPDATE April 27th 2017: YY He still has chronic insomnia, tends to have irregular sleep pattern, stay up late watching TV, he still very active during the day, take Adderall 10 mg twice a day, still drives short distance, mild unsteady gait, tends to hold his left arm to his chest   UPDATE Oct 17th 2017:YY He is with his wife,  Her daughter was hospital, presented with sudden loss of consciousness yesterday April 19 2016, which has brought back a lot of his memory, he has not tried clonazepam for chronic insomnia, worry about side effect, tried melatonin, still has significant insomnia, he is taking Tylenol pm,    UPDATE March 02 2018: He was follow-up by [facility] 2018, on December 19, 2017, [place], he had sudden onset dizziness, feels like going to pass out, then had transient loss of consciousness, eyes rolled back, staring, lasting for 1 minutes, followed by postevent mild confusion and slurred speech last about 40 minutes, was treated at local hospital.   I personally reviewed CT head without contrast: There was no acute abnormality, generalized atrophy, ventriculomegaly,  laboratory evaluation, EKG showed no significant abnormality.  Similar spells in March 2019, transient dizziness followed by sudden loss of muscle tone, consciousness, extreme fatigue, slept for 1 hour afterwards,   Patient reported that he actually has frequent spells about once a month, he has transient confusion, feels like he is going to pass out, short lasting,   UPDATE Jun 05 2018: He tolerated lamotrigine very well, taking 100 mg twice a day without significant side effect,   EEG was normal.  Since lamotrigine, he no  longer has recurrent spells of rising sensation transient confusion even though there was no clinical seizure activity observed during those spells, he had those spells about once a week prior to lamotrigine,  UPDATE Jun 19 2020: Is accompanied by his wife at today's clinical visit, slow worsening memory loss, when he goes to a different room, he often forgets why is he there, slight worsening gait abnormality, he also complains of worsening tinnitus, difficulty sleeping sometimes,  I personally reviewed CT temporal bone w/wo in April 2021:  1. Negative CT appearance of the bilateral temporal bones. No explanation for hearing loss. 2. Note a mildly high riding right jugular bulb, with thin but intact overlying bony covering. 3. Hyperplastic but clear visible paranasal sinuses.  UPDATE March 29th 2022: He used to have headache even before his cardiac arrest, only occasionally, mild, his 2 daughter does has typical migraine headache,  Over the years, he has occasionally headache usually short lasting, on March 26, he complains of transient sharp pain shooting from the left parietal region downward, lasting for few seconds, then had a trickle of pressure headache, take ibuprofen, which did improve  Next morning March 27 Sunday, while at church, at the end of the sermon, he was noted to holding his left side, he also noticed transient blurry vision, was brought to the emergency room,  I personally reviewed CT head with contrast, no acute intracranial abnormality, no venous sinus thrombosis, arterial branch that was visualized showed no significant abnormality  Laboratory showed normal ESR, negative troponin, normal CBC, BMP with exception of elevated glucose 140, was seen by ophthalmology today, no significant abnormality found, was told he has cataract, left side was slightly worse than the right  Update June 14th 2022: I reviewed his headache diary, few episode in March, transient sharp, only 1  episode on October 19, 2020, no longer has frequent headaches, reported worsening vision bilaterally, left worse than right, on today's near chart examination, visual acuity was 20/50 OS/OD,  Also complains of worsening gait abnormality, wife noticed worsening memory loss,  We personally reviewed CT of temporal bone with without contrast in April 2021 for evaluation of his reported hearing loss, no acute abnormality, CT venogram of brain on September 28, 2020 that was normal  REVIEW OF SYSTEMS: Out of a complete 14 system review of symptoms, the patient complains only of the following symptoms, and all other reviewed systems are negative.  Memory loss, seizures  ALLERGIES: Allergies  Allergen Reactions   Penicillins     Difficult Breathing, rash    HOME MEDICATIONS: Outpatient Medications Prior to Visit  Medication Sig Dispense Refill   amphetamine-dextroamphetamine (ADDERALL) 10 MG tablet Take 1 tablet (10 mg total) by mouth 2 (two) times daily. 180 tablet 0   Ascorbic Acid (VITAMIN C) 500 MG tablet Take 500 mg by mouth daily.     aspirin 81 MG tablet Take 81 mg by mouth daily.     diltiazem (CARTIA XT) 120 MG 24 hr capsule TAKE 1 CAPSULE BY MOUTH  DAILY. 90 capsule 3   donepezil (ARICEPT) 10 MG tablet TAKE 1 TABLET BY MOUTH  DAILY 90 tablet 3   fexofenadine (ALLEGRA) 180 MG tablet Take 180 mg by mouth daily.     Ginkgo Biloba 40 MG TABS Take by mouth as directed.     lamoTRIgine (LAMICTAL) 100 MG tablet TAKE 1 TABLET BY MOUTH  TWICE DAILY 180 tablet 3   levothyroxine (SYNTHROID) 137 MCG tablet Take 1 tablet (137 mcg total) by mouth daily before breakfast. 90 tablet 3   Melatonin 5 MG CAPS Take by mouth as directed.     memantine (NAMENDA) 10 MG tablet TAKE 1 TABLET BY MOUTH  TWICE DAILY 180 tablet 3   metoprolol succinate (TOPROL XL) 25 MG 24 hr tablet Take 1 tablet (25 mg total) by mouth daily. 90 tablet 3   montelukast (SINGULAIR) 10 MG tablet TAKE 1 TABLET BY MOUTH  DAILY 90 tablet 3    Multiple Vitamin (MULTIVITAMIN) tablet Take 1 tablet by mouth daily.     NON FORMULARY Focus factor 1 tab po bid     rosuvastatin (CRESTOR) 20 MG tablet Take 1 tablet (20 mg total) by mouth at bedtime. 90 tablet 3   No facility-administered medications prior to visit.    PAST MEDICAL HISTORY: Past Medical History:  Diagnosis Date   ADD (attention deficit disorder)    Allergy    seasonal flares of perinneal allergies   Cancer ([place])    melanoma   Cardiac arrest ([place]) 07/1997   aborted   Dementia ([place])    post resusitative   Depression    Diverticulosis    Family hx of prostate cancer    GERD (gastroesophageal reflux disease)    Headache(784.0)    Hyperlipidemia    Hypothyroidism    ICD (implantable cardiac defibrillator) in place    Idiopathic urticaria    Internal hemorrhoids    Low back pain syndrome    Melanoma in situ of skin of trunk ([place])    chest   PVC (premature ventricular contraction)    associated with cardiac arrest   Sleep apnea    no c-pap   Tubular adenoma of colon 10/2010    PAST SURGICAL HISTORY: Past Surgical History:  Procedure Laterality Date    implantation x2     CARDIAC DEFIBRILLATOR PLACEMENT  07/01/2010   Explanation of a previously implanted device, pocket revision, and insertion of a new device, and intraoperative defibrillation threshold testing Caryl Comes)   CARDIAC DEFIBRILLATOR REMOVAL  2004   Defibrillator change Caryl Comes) 3 times changed   HEMORRHOID SURGERY     ICD     TONSILLECTOMY      FAMILY HISTORY: Family History  Problem Relation Age of Onset   Hypertension Mother    Heart disease Father    Diverticulosis Father    Coronary artery disease Father    Alopecia Father    Prostate cancer Brother    Lung cancer Paternal Grandfather        Smoker   Cancer Paternal Grandfather        lung cancer - smoker   Diabetes Brother    Heart disease Brother        heart transplant   Colon cancer Neg Hx     SOCIAL HISTORY: Social  History   Socioeconomic History   Marital status: Married    Spouse name: [name]   Number of children: 2   Years of education: College   Highest education level: Not on  file  Occupational History    Employer: DISABLED  Tobacco Use   Smoking status: Former    Packs/day: 1.00    Years: 10.00    Pack years: 10.00    Types: Cigarettes    Quit date: 07/06/1995    Years since quitting: 25.4   Smokeless tobacco: Former    Types: Chew    Quit date: 11/04/1994  Vaping Use   Vaping Use: Never used  Substance and Sexual Activity   Alcohol use: No    Alcohol/week: 0.0 standard drinks    Comment: socially   Drug use: No   Sexual activity: Not on file  Other Topics Concern   Not on file  Social History Narrative   Patient is married [name]) [phone] with 2 daughters   5 grandchildren   Daily caffeine use - one cup of coffee and one-two cokes per day   Patient is right-handed.   Patient has a college education   Social Determinants of Radio broadcast assistant Strain: Low Risk    Difficulty of Paying Living Expenses: Not hard at all  Food Insecurity: No Food Insecurity   Worried About Charity fundraiser in the Last Year: Never true   Arboriculturist in the Last Year: Never true  Transportation Needs: No Transportation Needs   Lack of Transportation (Medical): No   Lack of Transportation (Non-Medical): No  Physical Activity: Inactive   Days of Exercise per Week: 0 days   Minutes of Exercise per Session: 0 min  Stress: No Stress Concern Present   Feeling of Stress : Not at all  Social Connections: Not on file  Intimate Partner Violence: Not At Risk   Fear of Current or Ex-Partner: No   Emotionally Abused: No   Physically Abused: No   Sexually Abused: No   PHYSICAL EXAM  Vitals:   12/16/20 1030  BP: (!) 152/88  Pulse: 78  Weight: 179 lb 6.4 oz (81.4 kg)  Height: 5' 9"  (1.753 m)   Body mass index is 26.49 kg/m.  Generalized: Well developed, in no acute distress  MMSE -  Mini Mental State Exam 10/03/2020 12/11/2019 08/29/2019  Not completed: Refused - -  Orientation to time - 5 5  Orientation to Place - 5 5  Registration - 3 3  Attention/ Calculation - 5 5  Recall - 1 3  Recall-comments - - -  Language- name 2 objects - 2 -  Language- repeat - 1 1  Language- follow 3 step command - 3 -  Language- read & follow direction - 1 -  Write a sentence - 1 -  Copy design - 1 -  Copy design-comments - 12 animals -  Total score - 28 -   NEUROLOGICAL EXAM:  MENTAL STATUS: Speech/Cognition: Awake, alert, normal speech, oriented to history taking and casual conversation.  CRANIAL NERVES: CN II: Visual fields are full to confrontation.  Pupils are small, reactive CN III, IV, VI: extraocular movement are normal. No ptosis. CN V: Facial sensation is intact to light touch. CN VII: Face is symmetric with normal eye closure and smile. CN VIII: Hearing is normal to casual conversation CN IX, X: Palate elevates symmetrically. Phonation is normal. CN XI: Head turning and shoulder shrug are intact  MOTOR: Fixation left upper extremity on rapid rotating movement  REFLEXES: Reflexes are 2  and symmetric at the biceps, triceps, knees and ankles. Plantar responses are flexor.  SENSORY: Intact to light touch, pinprick, positional and  vibratory sensation at fingers and toes.  COORDINATION: There is no trunk or limb ataxia.    GAIT/STANCE: He needs push-up to get up from seated position, tends to hold his left arm in elbow flexion, mildly wide-based, unsteady  DIAGNOSTIC DATA (LABS, IMAGING, TESTING) - I reviewed patient records, labs, notes, testing and imaging myself where available.  Lab Results  Component Value Date   WBC 8.5 10/03/2020   HGB 14.9 10/03/2020   HCT 43.7 10/03/2020   MCV 92.4 10/03/2020   PLT 337.0 10/03/2020      Component Value Date/Time   NA 141 10/03/2020 0804   NA 140 06/12/2019 1015   K 4.1 10/03/2020 0804   CL 102 10/03/2020 0804    CO2 32 10/03/2020 0804   GLUCOSE 99 10/03/2020 0804   BUN 13 10/03/2020 0804   BUN 13 06/12/2019 1015   CREATININE 1.08 10/03/2020 0804   CREATININE 1.07 12/23/2017 1451   CALCIUM 9.8 10/03/2020 0804   PROT 6.9 10/03/2020 0804   PROT 6.6 06/12/2019 1015   ALBUMIN 4.5 10/03/2020 0804   ALBUMIN 4.4 06/12/2019 1015   AST 23 10/03/2020 0804   ALT 30 10/03/2020 0804   ALKPHOS 72 10/03/2020 0804   BILITOT 0.6 10/03/2020 0804   BILITOT 0.3 06/12/2019 1015   GFRNONAA >60 09/28/2020 1534   GFRAA 101 06/12/2019 1015   Lab Results  Component Value Date   CHOL 207 (H) 10/03/2020   HDL 62.00 10/03/2020   LDLCALC 119 (H) 10/03/2020   LDLDIRECT 127.6 03/05/2014   TRIG 129.0 10/03/2020   CHOLHDL 3 10/03/2020   Lab Results  Component Value Date   HGBA1C 6.0 10/03/2020   No results found for: VITAMINB12 Lab Results  Component Value Date   TSH 5.94 (H) 12/08/2020      ASSESSMENT AND PLAN 70 y.o. year old male    Anoxic brain injury in 1999 due to prolonged cardiac arrest,  With residual mild cognitive impairment  Slow worsening over the years  Continue Aricept 10 mg daily, Namenda 10 mg twice a day  Encouraged moderate exercise  Seizure  Most recent in June 2019, EEG was normal  Continue lamotrigine 100 mg twice a day  Gait abnormality  Also related to the previous anoxic brain injury, deconditioning,  Headache  Description of headache most consistent with ice prick headache, suspicious he might have underlying migraine  There was transient elevated C-reactive protein in March, but repeat ESR C-reactive protein were all within normal limit, his ophthalmologist raised the concern of of temporal arteritis, but he has no jaw claudication, diffuse body achy pain, will repeat ESR, CRP  Hypothyroidism  Most recent TSH on October 03, 2020 with significantly elevated of 12.50, there was increase of his thyroid supplement, will repeat thyroid function again,    Marcial Pacas, M.D.  Ph.D.  [facility] [place], [place] [address] Phone: [phone] Fax:      [phone]

## 2020-12-17 ENCOUNTER — Telehealth: Payer: Self-pay | Admitting: *Deleted

## 2020-12-17 LAB — SEDIMENTATION RATE: Sed Rate: 2 mm/hr (ref 0–30)

## 2020-12-17 LAB — HIGH SENSITIVITY CRP: CRP, High Sensitivity: 0.87 mg/L (ref 0.00–3.00)

## 2020-12-17 LAB — C-REACTIVE PROTEIN: CRP: <1 mg/L (ref 0–10)

## 2020-12-17 NOTE — Telephone Encounter (Signed)
I spoke to the patient and he verbalized understanding of his results.

## 2020-12-17 NOTE — Telephone Encounter (Signed)
-----  Message from Marcial Pacas, MD sent at 12/17/2020  2:48 PM EDT ----- Please call pt for normal CRP, ESR, high sensitivity CRP.

## 2021-01-03 ENCOUNTER — Other Ambulatory Visit: Payer: Self-pay | Admitting: Cardiovascular Disease

## 2021-01-03 ENCOUNTER — Other Ambulatory Visit: Payer: Self-pay | Admitting: Neurology

## 2021-01-03 DIAGNOSIS — R413 Other amnesia: Secondary | ICD-10-CM

## 2021-01-09 ENCOUNTER — Ambulatory Visit (INDEPENDENT_AMBULATORY_CARE_PROVIDER_SITE_OTHER): Payer: Medicare Other

## 2021-01-09 DIAGNOSIS — Z8674 Personal history of sudden cardiac arrest: Secondary | ICD-10-CM

## 2021-01-11 LAB — CUP PACEART REMOTE DEVICE CHECK
Battery Remaining Longevity: 36 mo
Battery Remaining Percentage: 38 %
Brady Statistic RV Percent Paced: 1 %
Date Time Interrogation Session: 20220708032200
HighPow Impedance: 63 Ohm
Implantable Lead Implant Date: 19990204
Implantable Lead Location: 753860
Implantable Lead Model: 135
Implantable Lead Serial Number: 301213
Implantable Pulse Generator Implant Date: 20111228
Lead Channel Impedance Value: 741 Ohm
Lead Channel Pacing Threshold Amplitude: 1.2 V
Lead Channel Pacing Threshold Pulse Width: 1 ms
Lead Channel Setting Pacing Amplitude: 2.4 V
Lead Channel Setting Pacing Pulse Width: 1 ms
Lead Channel Setting Sensing Sensitivity: 0.4 mV
Pulse Gen Serial Number: 277857

## 2021-01-12 ENCOUNTER — Telehealth: Payer: Self-pay | Admitting: *Deleted

## 2021-01-12 NOTE — Telephone Encounter (Signed)
Patient called stating the cost of his Adderall is expensive. Patient stated that he was on the telephone today with a representative at [facility] and was advised that the price could be cheaper if Dr. Damita Dunnings would do a PA on the medication. Patient stated that he thinks the lady from [facility] was planning on faxing a form over to the office for Dr. Damita Dunnings to fill out. Patient wants to know if the form is not received if Dr. Damita Dunnings will proceed with the PA.  Patient stated that he does not need a refill at this time, but just wanted to get the PA done before he needs the next refill.

## 2021-01-13 DIAGNOSIS — H2513 Age-related nuclear cataract, bilateral: Secondary | ICD-10-CM | POA: Diagnosis not present

## 2021-01-13 NOTE — Telephone Encounter (Signed)
Larry [name] with [facility] PA dept requesting cb for additional information for clinical questions about Adderal PA. Sending note to [facility].

## 2021-01-13 NOTE — Telephone Encounter (Signed)
Please start the PA.  Thanks.

## 2021-01-14 NOTE — Telephone Encounter (Signed)
PA form completed and faxed back, will await a response

## 2021-01-15 NOTE — Telephone Encounter (Addendum)
Received fax from [place]. Tier Exception is denied as medication is a generic. Amphetamine/Dextroamphetamine is covered on his drug plan lit as a Tier 3 copay.   Looked it up on [employer] and he can get a 90 day supply at [facility] with a GoodRx card for less than $30.   He would like Dr Damita Dunnings to send the rx to [facility] in [place]. He will call [facility] and cancel the rx they have.

## 2021-01-15 NOTE — Addendum Note (Signed)
Addended by: Pilar Grammes on: 01/15/2021 10:27 AM   Modules accepted: Orders

## 2021-01-16 MED ORDER — AMPHETAMINE-DEXTROAMPHETAMINE 10 MG PO TABS: 10.0000 mg | ORAL_TABLET | Freq: Two times a day (BID) | ORAL | 0 refills | Status: DC

## 2021-01-16 NOTE — Addendum Note (Signed)
Addended by: [name] on: 01/16/2021 06:32 AM   Modules accepted: Orders

## 2021-01-16 NOTE — Telephone Encounter (Signed)
Sent. Thanks.   

## 2021-01-20 DIAGNOSIS — H2512 Age-related nuclear cataract, left eye: Secondary | ICD-10-CM | POA: Diagnosis not present

## 2021-01-27 ENCOUNTER — Ambulatory Visit: Payer: Medicare Other | Attending: Neurology

## 2021-01-27 ENCOUNTER — Other Ambulatory Visit: Payer: Self-pay

## 2021-01-27 ENCOUNTER — Encounter: Payer: Self-pay | Admitting: Ophthalmology

## 2021-01-27 VITALS — BP 135/87 | HR 92 | Resp 17 | Ht 69.0 in | Wt 176.0 lb

## 2021-01-27 DIAGNOSIS — R262 Difficulty in walking, not elsewhere classified: Secondary | ICD-10-CM | POA: Insufficient documentation

## 2021-01-27 DIAGNOSIS — R296 Repeated falls: Secondary | ICD-10-CM | POA: Diagnosis not present

## 2021-01-27 DIAGNOSIS — R269 Unspecified abnormalities of gait and mobility: Secondary | ICD-10-CM | POA: Insufficient documentation

## 2021-01-27 DIAGNOSIS — R2681 Unsteadiness on feet: Secondary | ICD-10-CM

## 2021-01-27 DIAGNOSIS — R278 Other lack of coordination: Secondary | ICD-10-CM | POA: Diagnosis not present

## 2021-01-27 NOTE — Therapy (Signed)
[place] MAIN [facility] SERVICES [address] [place], [place], [address] Phone: [phone]   Fax:  [phone]  Physical Therapy Evaluation  Patient Details  Name: Larry [name] MRN: [id number] Date of Birth: 1950/11/10 Referring Provider (PT): Dr. Krista Blue   Encounter Date: 01/27/2021   PT End of Session - 01/27/21 1138     Visit Number 1    Number of Visits 25    Date for PT Re-Evaluation 04/21/21    Authorization Time Period 01/27/2021- 04/21/2021    PT Start Time 0800    PT Stop Time 0900    PT Time Calculation (min) 60 min    Equipment Utilized During Treatment Gait belt    Behavior During Therapy WFL for tasks assessed/performed             Past Medical History:  Diagnosis Date   ADD (attention deficit disorder)    Allergy    seasonal flares of perinneal allergies   Cancer ([place])    melanoma   Cardiac arrest ([place]) 07/1997   aborted   Dementia ([place])    post resusitative   Depression    Diverticulosis    Family hx of prostate cancer    GERD (gastroesophageal reflux disease)    Headache(784.0)    Hyperlipidemia    Hypothyroidism    ICD (implantable cardiac defibrillator) in place    Idiopathic urticaria    Internal hemorrhoids    Low back pain syndrome    Melanoma in situ of skin of trunk ([place])    chest   PVC (premature ventricular contraction)    associated with cardiac arrest   Sleep apnea    no c-pap   Tubular adenoma of colon 10/2010    Past Surgical History:  Procedure Laterality Date    implantation x2     CARDIAC DEFIBRILLATOR PLACEMENT  07/01/2010   Explanation of a previously implanted device, pocket revision, and insertion of a new device, and intraoperative defibrillation threshold testing Caryl Comes)   CARDIAC DEFIBRILLATOR REMOVAL  2004   Defibrillator change Caryl Comes) 3 times changed   HEMORRHOID SURGERY     ICD     TONSILLECTOMY      Vitals:   01/27/21 0831  BP: 135/87  Pulse: 92  Resp: 17   SpO2: 97%  Weight: 176 lb (79.8 kg)  Height: '5\' 9"'$  (1.753 m)      Subjective Assessment - 01/27/21 0816     Limitations Walking;House hold activities    How long can you sit comfortably? no limitations    How long can you stand comfortably? Not really limited    How long can you walk comfortably? 15-20    Patient Stated Goals To improve transfers, balance and walking.                [facility] PT Assessment - 01/27/21 0818       Assessment   Referring Provider (PT) Dr. Krista Blue    Onset Date/Surgical Date 07/05/20    Hand Dominance Right    Prior Therapy none      Restrictions   Weight Bearing Restrictions No      Balance Screen   Has the patient fallen in the past 6 months Yes    How many times? 2    Has the patient had a decrease in activity level because of a fear of falling?  Yes    Is the patient reluctant to leave their home because of a fear of falling?  Yes      [place] residence    Living Arrangements Spouse/significant other    Available Help at Discharge Family    Type of [place] to enter    Entrance Stairs-Number of Steps 1    [place] One level      Prior Function   Level of [place] Retired    Leisure enjoys [employer], playing cards, watching TV      Cognition   Overall Cognitive Status Impaired/Different from baseline    Area of Impairment Memory    Memory Decreased short-term memory    Attention Alternating    Memory Impaired    Memory Impairment Decreased recall of new information;Decreased short term memory    Awareness Appears intact    Problem Solving Impaired    Problem Solving Impairment Functional basic      Standardized Balance Assessment   Standardized Balance Assessment Berg Balance Test      Berg Balance Test   Sit to Stand Able to stand  independently using hands    Standing Unsupported Able to stand  safely 2 minutes    Sitting with Back Unsupported but Feet Supported on Floor or Stool Able to sit safely and securely 2 minutes    Stand to Sit Controls descent by using hands    Transfers Able to transfer safely, definite need of hands    Standing Unsupported with Eyes Closed Able to stand 10 seconds with supervision    Standing Unsupported with Feet Together Able to place feet together independently and stand for 1 minute with supervision    From Standing, Reach Forward with Outstretched Arm Can reach forward >12 cm safely (5")    From Standing Position, Pick up Object from Floor Able to pick up shoe, needs supervision    From Standing Position, Turn to Look Behind Over each Shoulder Looks behind from both sides and weight shifts well    Turn 360 Degrees Able to turn 360 degrees safely but slowly    Standing Unsupported, Alternately Place Feet on Step/Stool Able to stand independently and safely and complete 8 steps in 20 seconds    Standing Unsupported, One Foot in Front Able to take small step independently and hold 30 seconds    Standing on One Leg Able to lift leg independently and hold equal to or more than 3 seconds    Total Score 43               OBJECTIVE  MUSCULOSKELETAL: Tremor: Absent Bulk: Normal Tone: Normal, no clonus  Posture Forward head, rounded shoulders  Gait Short step to gait sequencing- Forward head- looking down with mild unsteadiness.   Strength R/L 4/4 Hip flexion 4/4 Hip external rotation 4/4 Hip internal rotation 4/4 Hip extension  4/4 Hip abduction 4/4Hip adduction 4/4 Knee extension 4/4Knee flexion 4/4 Ankle Plantarflexion 4/4 Ankle Dorsiflexion   NEUROLOGICAL:  Sensation Grossly intact to light touch bilateral UEs/LEs as determined by testing dermatomes C2-T2/L2-S2 respectively Proprioception and hot/cold testing deferred on this date   Coordination/Cerebellar Finger to Nose: imparied Heel to Shin: WNL Rapid alternating  movements: impaired Finger Opposition: impaired  FUNCTIONAL OUTCOME MEASURES   Results Comments  BERG 43/56 Fall risk, in need of intervention          TUG 20.97 seconds Indicative of increased risk of falling- no AD utilized today  5TSTS  30.07 seconds No UE support  6 Minute Walk Test  Will assess next visit and add goal if appropriate.   10 Meter Gait Speed Self-selected: 14.93 s = 0.67 m/s Below normative values for full community ambulation            ASSESSMENT Clinical Impression: Pt is a pleasant 70 year-old male referred for difficulty with walking and impaired balance. Patient reports > 6 month progressive decline in mobility and wife reports he is struggling to pick up his feet, difficulty with transfers and walking with 2 falls in past few months. PT examination reveals deficits including decreased LE strength, impaired gait and balance as seen by decrease scores on the 5 time sit to stand test, BERG balance test,decreased TUG, and gait speed all indicative of increased risk of falling. Pt will benefit from skilled PT services to address deficits in LE strength, mobility, and balance to restore independence with household and community mobility and decrease risk for future falls.    PLAN Next Visit: Assess 6 min walk test and add goal if appropriate. Begin instruction in LE strengthening/balance exericses next visit. HEP: To be initiated next visit.          Objective measurements completed on examination: See above findings.                 PT Short Term Goals - 01/27/21 1011       PT SHORT TERM GOAL #1   Title Pt will be independent with HEP in order to improve strength and balance in order to decrease fall risk and improve function at home and work.    Baseline 01/27/2021- Patient presents with no formal HEP in place    Time 6    Period Weeks    Status New    Target Date 03/10/21               PT Long Term Goals - 01/27/21 1011        PT LONG TERM GOAL #1   Title Pt will improve BERG by at least 5 points in order to demonstrate clinically significant improvement in balance.    Baseline 01/27/2021= 43/56    Time 12    Period Weeks    Status New    Target Date 04/21/21      PT LONG TERM GOAL #2   Title Pt will improve FOTO to target score of 55% to display perceived improvements in ability to complete ADL's.    Baseline 01/27/2021= 42%    Time 12    Period Weeks    Status New    Target Date 04/21/21      PT LONG TERM GOAL #3   Title Pt will decrease 5TSTS by at least 3 seconds in order to demonstrate clinically significant improvement in LE strength.    Baseline 01/27/2021= 30.07 without UE support    Time 12    Period Weeks    Status New    Target Date 04/21/21      PT LONG TERM GOAL #4   Title Pt will decrease TUG to below 15  seconds/decrease in order to demonstrate decreased fall risk.    Baseline 01/27/2021= 20.97 without an AD    Time 12    Period Weeks    Status New    Target Date 04/21/21      PT LONG TERM GOAL #5   Title Pt will increase 10MWT by at least 0.13 m/s in order to  demonstrate clinically significant improvement in community ambulation.    Baseline 01/27/2021= 0.67 m/s without an AD    Time 12    Period Weeks    Status New    Target Date 04/21/21                    Plan - 01/27/21 1123     Clinical Impression Statement Pt is a pleasant 70 year-old male referred for difficulty with walking and impaired balance. Patient reports > 6 month progressive decline in mobility and wife reports he is struggling to pick up his feet, difficulty with transfers and walking with 2 falls in past few months. PT examination reveals deficits including decreased LE strength, impaired gait and balance as seen by decrease scores on the 5 time sit to stand test, BERG balance test,decreased TUG, and gait speed all indicative of increased risk of falling. Pt will benefit from skilled PT services to address  deficits in LE strength, mobility, and balance to restore independence with household and community mobility and decrease risk for future falls.    Personal Factors and Comorbidities Comorbidity 3+    Comorbidities Anoxic brain injury (1999), Cardiac (pacemaker/debrillator), HTN    Examination-Activity Limitations Bathing;Caring for Others;Lift;Stairs;Transfers    Examination-Participation Restrictions Community Activity;Yard Work    Stability/Clinical Decision Making Stable/Uncomplicated    Clinical Decision Making Moderate    Rehab Potential Good    PT Frequency 2x / week    PT Duration 12 weeks    PT Treatment/Interventions ADLs/Self Care Home Management;Cryotherapy;Moist Heat;DME Instruction;Gait training;Stair training;Functional mobility training;Therapeutic activities;Therapeutic exercise;Balance training;Neuromuscular re-education;Patient/family education;Manual techniques;Passive range of motion;Dry needling    PT Next Visit Plan Assess 6 min walk test and add goal if appropriate. Beging instruction in LE strengthening/balance exericses next visit.    PT Home Exercise Plan To be initiated next visit.    Recommended Other Services Will continue to monitor- may request order for Speech for impaired cognition.    Consulted and Agree with Plan of Care Patient;Family member/caregiver    Family Member Consulted Wife- [name]             Patient will benefit from skilled therapeutic intervention in order to improve the following deficits and impairments:  Abnormal gait, Decreased activity tolerance, Decreased balance, Decreased cognition, Decreased coordination, Decreased endurance, Decreased mobility, Decreased strength, Difficulty walking  Visit Diagnosis: Abnormality of gait and mobility  Difficulty in walking, not elsewhere classified  Other lack of coordination  Repeated falls  Unsteadiness on feet     Problem List Patient Active Problem List   Diagnosis Date Noted    Gait abnormality 12/16/2020   Nonintractable headache 09/30/2020   Tinnitus 09/09/2019   History of sudden cardiac arrest 01/09/2019   Viral syndrome 09/06/2018   Seizures ([place]) 03/02/2018   Syncope 12/26/2017   Other social stressor 12/26/2017   Health care maintenance 08/14/2017   Family hx of prostate cancer    Allergy    Insomnia 10/20/2015   Advance care planning 03/08/2014   Memory loss 09/14/2013   Hypoxic brain injury ([place]) 09/14/2013   Medicare annual wellness visit, subsequent 02/16/2013   Shortness of breath 08/10/2011   Implantable cardioverter-defibrillator (ICD) in situ 02/03/2009   Hypothyroidism 11/18/2008   CONTACT DERMATITIS&OTHER ECZEMA DUE TO PLANTS 02/03/2008   IDIOPATHIC URTICARIA 02/03/2008   Hypertension 02/03/2008   HLD (hyperlipidemia) 11/02/2007   Attention deficit disorder 10/30/2007   PVC/VT non sustained 11/02/2007   Sleep apnea 10/04/2007   HEADACHE 10/12/2007  SUDDEN DEATH-aborted 10/22/2007   LOW BACK PAIN SYNDROME 10/11/2007    Lewis Moccasin, PT 01/27/2021, 11:39 AM  [place] MAIN [facility] SERVICES [address] [place], [place], [address] Phone: [phone]   Fax:  [phone]  Name: Larry [name] MRN: [id number] Date of Birth: 06-Aug-1950

## 2021-02-02 NOTE — Progress Notes (Signed)
Remote ICD transmission.   

## 2021-02-03 ENCOUNTER — Ambulatory Visit: Payer: Medicare Other | Attending: Neurology

## 2021-02-03 ENCOUNTER — Other Ambulatory Visit: Payer: Self-pay

## 2021-02-03 DIAGNOSIS — R2681 Unsteadiness on feet: Secondary | ICD-10-CM

## 2021-02-03 DIAGNOSIS — R296 Repeated falls: Secondary | ICD-10-CM | POA: Insufficient documentation

## 2021-02-03 DIAGNOSIS — R278 Other lack of coordination: Secondary | ICD-10-CM | POA: Insufficient documentation

## 2021-02-03 DIAGNOSIS — R269 Unspecified abnormalities of gait and mobility: Secondary | ICD-10-CM | POA: Diagnosis not present

## 2021-02-03 DIAGNOSIS — M6281 Muscle weakness (generalized): Secondary | ICD-10-CM

## 2021-02-03 DIAGNOSIS — R262 Difficulty in walking, not elsewhere classified: Secondary | ICD-10-CM

## 2021-02-03 NOTE — Discharge Instructions (Signed)

## 2021-02-03 NOTE — Therapy (Signed)
[place] MAIN [facility] SERVICES [address] [place], [place], [address] Phone: [phone]   Fax:  [phone]  Physical Therapy Treatment  Patient Details  Name: Larry [name] MRN: [id number] Date of Birth: 1950/07/09 Referring Provider (PT): Dr. Krista Blue   Encounter Date: 02/03/2021   PT End of Session - 02/03/21 0820     Visit Number 2    Number of Visits 25    Date for PT Re-Evaluation 04/21/21    Authorization Time Period 01/27/2021- 04/21/2021    PT Start Time 0830    PT Stop Time 0914    PT Time Calculation (min) 44 min    Equipment Utilized During Treatment Gait belt    Behavior During Therapy WFL for tasks assessed/performed             Past Medical History:  Diagnosis Date   ADD (attention deficit disorder)    Allergy    seasonal flares of perinneal allergies   Cancer ([place])    melanoma   Cardiac arrest ([place]) 07/1997   aborted   Dementia ([place])    post resusitative   Depression    Diverticulosis    Family hx of prostate cancer    GERD (gastroesophageal reflux disease)    Headache(784.0)    Hyperlipidemia    Hypothyroidism    ICD (implantable cardiac defibrillator) in place    Idiopathic urticaria    Internal hemorrhoids    Low back pain syndrome    Melanoma in situ of skin of trunk ([place])    chest   PVC (premature ventricular contraction)    associated with cardiac arrest   Sleep apnea    no c-pap   Tubular adenoma of colon 10/2010    Past Surgical History:  Procedure Laterality Date    implantation x2     CARDIAC DEFIBRILLATOR PLACEMENT  07/01/2010   Explanation of a previously implanted device, pocket revision, and insertion of a new device, and intraoperative defibrillation threshold testing Caryl Comes)   CARDIAC DEFIBRILLATOR REMOVAL  2004   Defibrillator change Caryl Comes) 3 times changed   HEMORRHOID SURGERY     ICD     TONSILLECTOMY      There were no vitals filed for this visit.   Subjective  Assessment - 02/03/21 0811     Subjective Patient reports feeling tired and tight today.    Patient is accompained by: Family member    Pertinent History Per note submitted by Dr. Krista Blue on 12/15/2020- The patient has an underlying medical history of cardiac death from ventricular fibrillation with hypoxic brain injury in Jan 1999 due to arrythmia, 15 minutes without heart beat,  status post defibrillator placement, He stayed in hospital for 35 days. On 12/16/2020-Gait abnormality              Also related to the previous anoxic brain injury, deconditioning,    Limitations Walking;House hold activities    How long can you sit comfortably? no limitations    How long can you stand comfortably? Not really limited    How long can you walk comfortably? 15-20    Patient Stated Goals To improve transfers, balance and walking.    Currently in Pain? No/denies           Interventions:   Assessed 6 min walk test today = 1100 feet without an AD - completing the test. O2 sat= 96% and HR=104 bpm. Patient reports as fatigued.   Therapeutic Exercises: Instructed patient at // Glenn[facility]  in standing LE strengthening exercises: Standing hip march Standing hip abd Standing hip ext Standing Knee flex Standing [place] Standing Calf raises Standing Toe raises 10-15 reps each with Education provided throughout session via VC/TC and demonstration to facilitate movement at target joints and correct muscle activation for all testing and exercises performed. Patient denied any pain with above activities       Access Code: [email or web address] URL: [email or web address] Date: 02/03/2021 Prepared by: Sande Brothers,  PT  Exercises Standing Hip Flexion March - 1 x daily - 5 x weekly - 3 sets - 10-15 reps - 2 hold Standing Hip Abduction with Counter Support - 1 x daily - 5 x weekly - 3 sets - 10-12 reps - 2 hold Standing Hip Extension with Counter Support - 1 x daily - 5 x weekly - 3 sets - 10-15 reps - 2  hold Standing Knee Flexion - 1 x daily - 5 x weekly - 3 sets - 10-15 reps - 2 hold Mini Squat with Counter Support - 1 x daily - 5 x weekly - 3 sets - 10-15 reps - 2 hold Standing Heel Raises - 1 x daily - 5 x weekly - 3 sets - 10-12 reps - 2 hold Standing Toe Raises at Chair - 1 x daily - 5 x weekly - 3 sets - 10-15 reps - 2 hold    Clinical Impression: Patient responded well to instruction of standing LE strengthening exercises today. He was responsive to [facility] and visual demo to perform all activities correctly. Added a 6 min walk test goal to address his endurance and anticipate him to do well based on motivation and wife assisting in encouraging him to follow through. She was present at today's session to observe his introduction in LE strengthening and will help him remain compliant. Pt will benefit from continued skilled PT services to address deficits in LE strength, mobility, and balance to restore independence with household and community mobility and decrease risk for future falls                  PT Education - 02/04/21 0820     Education Details Exercise technique    Person(s) Educated Patient    Methods Explanation;Demonstration;Tactile cues;Verbal cues;Handout    Comprehension Verbalized understanding;Returned demonstration;Verbal cues required;Tactile cues required;Need further instruction              PT Short Term Goals - 01/27/21 1011       PT SHORT TERM GOAL #1   Title Pt will be independent with HEP in order to improve strength and balance in order to decrease fall risk and improve function at home and work.    Baseline 01/27/2021- Patient presents with no formal HEP in place    Time 6    Period Weeks    Status New    Target Date 03/10/21               PT Long Term Goals - 02/03/21 0855       PT LONG TERM GOAL #1   Title Pt will improve BERG by at least 5 points in order to demonstrate clinically significant improvement in balance.     Baseline 01/27/2021= 43/56    Time 12    Period Weeks    Status New    Target Date 04/21/21      PT LONG TERM GOAL #2   Title Pt will improve FOTO to target score of 55% to display perceived improvements in ability  to complete ADL's.    Baseline 01/27/2021= 42%    Time 12    Period Weeks    Status New    Target Date 04/21/21      PT LONG TERM GOAL #3   Title Pt will decrease 5TSTS by at least 3 seconds in order to demonstrate clinically significant improvement in LE strength.    Baseline 01/27/2021= 30.07 without UE support    Time 12    Period Weeks    Status New    Target Date 04/21/21      PT LONG TERM GOAL #4   Title Pt will decrease TUG to below 15  seconds/decrease in order to demonstrate decreased fall risk.    Baseline 01/27/2021= 20.97 without an AD    Time 12    Period Weeks    Status New    Target Date 04/21/21      PT LONG TERM GOAL #5   Title Pt will increase 10MWT by at least 0.13 m/s in order to demonstrate clinically significant improvement in community ambulation.    Baseline 01/27/2021= 0.67 m/s without an AD    Time 12    Period Weeks    Status New    Target Date 04/21/21      Additional Long Term Goals   Additional Long Term Goals Yes      PT LONG TERM GOAL #6   Title Pt will increase 6MWT by at least 44m(1610f in order to demonstrate clinically significant improvement in cardiopulmonary endurance and community ambulation    Baseline 02/03/2021= 1100 feet    Time 11    Period Weeks    Status New    Target Date 04/21/21                   Plan - 02/03/21 0820     Clinical Impression Statement Patient responded well to instruction of standing LE strengthening exercises today. [facility] to perform all activities correctly. Added a 6 min walk test goal to address his endurance and anticipate him to do well based on motivation and wife assisting in encouraging him to follow through. She was present at today's session  to observe his introduction in LE strengthening and will help him remain compliant. Pt will benefit from continued skilled PT services to address deficits in LE strength, mobility, and balance to restore independence with household and community mobility and decrease risk for future falls  ?    Personal Factors and Comorbidities Comorbidity 3+    Comorbidities Anoxic brain injury (1999), Cardiac (pacemaker/debrillator), HTN    Examination-Activity Limitations Bathing;Caring for Others;Lift;Stairs;Transfers    Examination-Participation Restrictions Community Activity;Yard Work    Stability/Clinical Decision Making Stable/Uncomplicated    Rehab Potential Good    PT Frequency 2x / week    PT Duration 12 weeks    PT Treatment/Interventions ADLs/Self Care Home Management;Cryotherapy;Moist Heat;DME Instruction;Gait training;Stair training;Functional mobility training;Therapeutic activities;Therapeutic exercise;Balance training;Neuromuscular re-education;Patient/family education;Manual techniques;Passive range of motion;Dry needling    PT Next Visit Plan Progress LE strengthening next visit and add beginner balance exercises.    PT Home Exercise Plan Access Code: [email or web address]  URL: [email or web address] (Standing LE strengthening home program)    Consulted and Agree with Plan of Care Patient;Family member/caregiver    Family Member Consulted Wife- KaJuliann Pulse           Patient will benefit from skilled therapeutic intervention in order to improve the following deficits and impairments:  Abnormal gait, Decreased activity tolerance, Decreased balance, Decreased cognition, Decreased coordination, Decreased endurance, Decreased mobility, Decreased strength, Difficulty walking  Visit Diagnosis: Abnormality of gait and mobility  Difficulty in walking, not elsewhere classified  Muscle weakness (generalized)  Unsteadiness on feet     Problem List Patient Active Problem List   Diagnosis  Date Noted   Gait abnormality 12/16/2020   Nonintractable headache 09/30/2020   Tinnitus 09/09/2019   History of sudden cardiac arrest 01/09/2019   Viral syndrome 09/06/2018   Seizures ([place]) 03/02/2018   Syncope 12/26/2017   Other social stressor 12/26/2017   Health care maintenance 08/14/2017   Family hx of prostate cancer    Allergy    Insomnia 10/20/2015   Advance care planning 03/08/2014   Memory loss 09/14/2013   Hypoxic brain injury ([place]) 09/14/2013   Medicare annual wellness visit, subsequent 02/16/2013   Shortness of breath 08/10/2011   Implantable cardioverter-defibrillator (ICD) in situ 02/03/2009   Hypothyroidism 11/18/2008   CONTACT DERMATITIS&OTHER ECZEMA DUE TO PLANTS 02/03/2008   IDIOPATHIC URTICARIA 02/03/2008   Hypertension 02/03/2008   HLD (hyperlipidemia) 11-15-07   Attention deficit disorder 11/15/2007   PVC/VT non sustained 11-15-07   Sleep apnea 2007-11-15   HEADACHE 11-15-07   SUDDEN DEATH-aborted Nov 15, 2007   LOW BACK PAIN SYNDROME 10/11/2007    Lewis Moccasin, PT 02/04/2021, 9:22 AM  [place] MAIN [facility] SERVICES [address] [place], [place], [address] Phone: [phone]   Fax:  [phone]  Name: Larry [name] MRN: [id number] Date of Birth: Aug 10, 1950

## 2021-02-04 ENCOUNTER — Encounter: Payer: Self-pay | Admitting: Ophthalmology

## 2021-02-04 ENCOUNTER — Ambulatory Visit: Payer: Medicare Other | Admitting: Anesthesiology

## 2021-02-04 ENCOUNTER — Encounter
Admission: RE | Disposition: A | Discharge status: Home / Self Care | Payer: Self-pay | Source: Home / Self Care | Attending: Ophthalmology

## 2021-02-04 ENCOUNTER — Other Ambulatory Visit: Payer: Self-pay

## 2021-02-04 ENCOUNTER — Ambulatory Visit
Admission: RE | Admit: 2021-02-04 | Discharge: 2021-02-04 | Disposition: A | Discharge status: Home / Self Care | Payer: Medicare Other | Attending: Ophthalmology | Admitting: Ophthalmology

## 2021-02-04 DIAGNOSIS — Z87891 Personal history of nicotine dependence: Secondary | ICD-10-CM | POA: Diagnosis not present

## 2021-02-04 DIAGNOSIS — Z79899 Other long term (current) drug therapy: Secondary | ICD-10-CM | POA: Diagnosis not present

## 2021-02-04 DIAGNOSIS — Z79890 Hormone replacement therapy (postmenopausal): Secondary | ICD-10-CM | POA: Insufficient documentation

## 2021-02-04 DIAGNOSIS — Z7982 Long term (current) use of aspirin: Secondary | ICD-10-CM | POA: Insufficient documentation

## 2021-02-04 DIAGNOSIS — H2512 Age-related nuclear cataract, left eye: Secondary | ICD-10-CM | POA: Insufficient documentation

## 2021-02-04 DIAGNOSIS — Z880 Allergy status to penicillin: Secondary | ICD-10-CM | POA: Insufficient documentation

## 2021-02-04 DIAGNOSIS — H25812 Combined forms of age-related cataract, left eye: Secondary | ICD-10-CM | POA: Diagnosis not present

## 2021-02-04 DIAGNOSIS — Z95810 Presence of automatic (implantable) cardiac defibrillator: Secondary | ICD-10-CM | POA: Diagnosis not present

## 2021-02-04 HISTORY — PX: CATARACT EXTRACTION W/PHACO: SHX586

## 2021-02-04 SURGERY — PHACOEMULSIFICATION, CATARACT, WITH IOL INSERTION
Anesthesia: General | Site: Eye | Laterality: Left

## 2021-02-04 MED ORDER — MEPERIDINE HCL 25 MG/ML IJ SOLN: 6.2500 mg | INTRAMUSCULAR | Status: DC | PRN

## 2021-02-04 MED ORDER — SIGHTPATH DOSE#1 NA HYALUR & NA CHOND-NA HYALUR IO KIT
PACK | INTRAOCULAR | Status: DC | PRN
  Administered 2021-02-04: 1 via OPHTHALMIC

## 2021-02-04 MED ORDER — OXYCODONE HCL 5 MG/5ML PO SOLN: 5.0000 mg | Freq: Once | ORAL | Status: DC | PRN

## 2021-02-04 MED ORDER — FENTANYL CITRATE PF 50 MCG/ML IJ SOSY: 25.0000 ug | PREFILLED_SYRINGE | INTRAMUSCULAR | Status: DC | PRN

## 2021-02-04 MED ORDER — MIDAZOLAM HCL 2 MG/2ML IJ SOLN
INTRAMUSCULAR | Status: DC | PRN
  Administered 2021-02-04: 2 mg via INTRAVENOUS

## 2021-02-04 MED ORDER — CYCLOPENTOLATE HCL 2 % OP SOLN
1.0000 [drp] | OPHTHALMIC | Status: DC | PRN
  Administered 2021-02-04 (×3): 1 [drp] via OPHTHALMIC

## 2021-02-04 MED ORDER — MOXIFLOXACIN HCL 0.5 % OP SOLN
OPHTHALMIC | Status: DC | PRN
  Administered 2021-02-04: 0.2 mL via OPHTHALMIC

## 2021-02-04 MED ORDER — FENTANYL CITRATE (PF) 100 MCG/2ML IJ SOLN
INTRAMUSCULAR | Status: DC | PRN
  Administered 2021-02-04: 50 ug via INTRAVENOUS

## 2021-02-04 MED ORDER — PROMETHAZINE HCL 25 MG/ML IJ SOLN: 6.2500 mg | INTRAMUSCULAR | Status: DC | PRN

## 2021-02-04 MED ORDER — SIGHTPATH DOSE#1 BSS IO SOLN
INTRAOCULAR | Status: DC | PRN
  Administered 2021-02-04: 57 mL via OPHTHALMIC

## 2021-02-04 MED ORDER — SIGHTPATH DOSE#1 BSS IO SOLN
INTRAOCULAR | Status: DC | PRN
  Administered 2021-02-04: 1 mL via INTRAMUSCULAR

## 2021-02-04 MED ORDER — TETRACAINE HCL 0.5 % OP SOLN
1.0000 [drp] | OPHTHALMIC | Status: DC | PRN
  Administered 2021-02-04 (×3): 1 [drp] via OPHTHALMIC

## 2021-02-04 MED ORDER — BRIMONIDINE TARTRATE-TIMOLOL 0.2-0.5 % OP SOLN
OPHTHALMIC | Status: DC | PRN
  Administered 2021-02-04: 1 [drp] via OPHTHALMIC

## 2021-02-04 MED ORDER — OXYCODONE HCL 5 MG PO TABS: 5.0000 mg | ORAL_TABLET | Freq: Once | ORAL | Status: DC | PRN

## 2021-02-04 MED ORDER — LACTATED RINGERS IV SOLN: INTRAVENOUS | Status: DC

## 2021-02-04 MED ORDER — PHENYLEPHRINE HCL 10 % OP SOLN
1.0000 [drp] | OPHTHALMIC | Status: DC | PRN
  Administered 2021-02-04 (×3): 1 [drp] via OPHTHALMIC

## 2021-02-04 MED ORDER — SIGHTPATH DOSE#1 BSS IO SOLN
INTRAOCULAR | Status: DC | PRN
  Administered 2021-02-04: 15 mL

## 2021-02-04 SURGICAL SUPPLY — 20 items
CANNULA ANT/CHMB 27G (MISCELLANEOUS) ×1 IMPLANT
CANNULA ANT/CHMB 27GA (MISCELLANEOUS) ×2 IMPLANT
GLOVE SURG ENC TEXT LTX SZ7.5 (GLOVE) ×2 IMPLANT
GLOVE SURG TRIUMPH 8.0 PF LTX (GLOVE) ×2 IMPLANT
GOWN STRL REUS W/ TWL LRG LVL3 (GOWN DISPOSABLE) ×2 IMPLANT
GOWN STRL REUS W/TWL LRG LVL3 (GOWN DISPOSABLE) ×4
LENS IOL ACRSF VT TRC 315 24.0 IMPLANT
LENS IOL ACRYSOF VIVITY 24.0 ×2 IMPLANT
LENS IOL VIVITY 315 24.0 ×1 IMPLANT
MARKER SKIN DUAL TIP RULER LAB (MISCELLANEOUS) ×2 IMPLANT
NDL CAPSULORHEX 25GA (NEEDLE) ×1 IMPLANT
NDL FILTER BLUNT 18X1 1/2 (NEEDLE) ×2 IMPLANT
NEEDLE CAPSULORHEX 25GA (NEEDLE) ×2 IMPLANT
NEEDLE FILTER BLUNT 18X 1/2SAF (NEEDLE) ×2
NEEDLE FILTER BLUNT 18X1 1/2 (NEEDLE) ×2 IMPLANT
PACK EYE AFTER SURG (MISCELLANEOUS) ×2 IMPLANT
SYR 3ML LL SCALE MARK (SYRINGE) ×4 IMPLANT
SYR TB 1ML LUER SLIP (SYRINGE) ×2 IMPLANT
WATER STERILE IRR 250ML POUR (IV SOLUTION) ×2 IMPLANT
WIPE NON LINTING 3.25X3.25 (MISCELLANEOUS) ×2 IMPLANT

## 2021-02-04 NOTE — Op Note (Signed)
LOCATION:  [place]   PREOPERATIVE DIAGNOSIS:  Nuclear sclerotic cataract of the left eye.  H25.12  POSTOPERATIVE DIAGNOSIS:  Nuclear sclerotic cataract of the left eye.   PROCEDURE:  Phacoemulsification with Toric posterior chamber intraocular lens placement of the left eye.  Ultrasound time: Procedure(s) with comments: CATARACT EXTRACTION PHACO AND INTRAOCULAR LENS PLACEMENT (IOC) LEFT VIVITY toric LENS 9.21 01:11.6 (Left) - keep patient at 11:30 arrival  LENS:ORIMPLANT3@  [id number] VivityToric intraocular lens with 1.5 diopters of cylindrical power with axis orientation at 179 degrees.     SURGEON:  Wyonia Hough, MD   ANESTHESIA:  Topical with tetracaine drops and 2% Xylocaine jelly, augmented with 1% preservative-free intracameral lidocaine.  COMPLICATIONS:  None.   DESCRIPTION OF PROCEDURE:  The patient was identified in the holding room and transported to the operating suite and placed in the supine position under the operating microscope.  The left eye was identified as the operative eye, and it was prepped and draped in the usual sterile ophthalmic fashion.    A clear-corneal paracentesis incision was made at the 1:30 position.  0.5 ml of preservative-free 1% lidocaine was injected into the anterior chamber. The anterior chamber was filled with Viscoat.  A 2.4 millimeter near clear corneal incision was then made at the 10:30 position.  A cystotome and capsulorrhexis forceps were then used to make a curvilinear capsulorrhexis.  Hydrodissection and hydrodelineation were then performed using balanced salt solution.   Phacoemulsification was then used in stop and chop fashion to remove the lens, nucleus and epinucleus.  The remaining cortex was aspirated using the irrigation and aspiration handpiece.  Provisc viscoelastic was then placed into the capsular bag to distend it for lens placement.  The Verion digital marker was used to align the implant at the intended  axis.   A Toric lens was then injected into the capsular bag.  It was rotated clockwise until the axis marks on the lens were approximately 15 degrees in the counterclockwise direction to the intended alignment.  The viscoelastic was aspirated from the eye using the irrigation aspiration handpiece.  Then, a Koch spatula through the sideport incision was used to rotate the lens in a clockwise direction until the axis markings of the intraocular lens were lined up with the Verion alignment.  Balanced salt solution was then used to hydrate the wounds. Vigamox 0.2 ml of a '1mg'$  per ml solution was injected into the anterior chamber for a dose of 0.2 mg of intracameral antibiotic at the completion of the case.    The eye was noted to have a physiologic pressure and there was no wound leak noted.   Timolol and Brimonidine drops were applied to the eye.  The patient was taken to the recovery room in stable condition having had no complications of anesthesia or surgery.  Tichina Koebel 02/04/2021, 12:37 PM

## 2021-02-04 NOTE — Anesthesia Procedure Notes (Signed)
Procedure Name: MAC Date/Time: 02/04/2021 12:30 PM Performed by: Mayme Genta, CRNA Pre-anesthesia Checklist: Patient identified, Emergency Drugs available, Suction available, Timeout performed and Patient being monitored Patient Re-evaluated:Patient Re-evaluated prior to induction Oxygen Delivery Method: Nasal cannula Placement Confirmation: positive ETCO2

## 2021-02-04 NOTE — Anesthesia Postprocedure Evaluation (Signed)
Anesthesia Post Note  Patient: Larry [name]  Procedure(s) Performed: CATARACT EXTRACTION PHACO AND INTRAOCULAR LENS PLACEMENT (IOC) LEFT VIVITY toric LENS 9.21 01:11.6 (Left: Eye)     Patient location during evaluation: PACU Anesthesia Type: General Level of consciousness: awake and alert Pain management: pain level controlled Vital Signs Assessment: post-procedure vital signs reviewed and stable Respiratory status: spontaneous breathing, nonlabored ventilation, respiratory function stable and patient connected to nasal cannula oxygen Cardiovascular status: stable and blood pressure returned to baseline Postop Assessment: no apparent nausea or vomiting Anesthetic complications: no   No notable events documented.  Thornton Dohrmann, Glade Stanford

## 2021-02-04 NOTE — H&P (Signed)
[facility]   Primary Care Physician:  [name], MD Ophthalmologist: Dr. Leandrew Koyanagi  Pre-Procedure History & Physical: HPI:  Larry [name] is a 70 y.o. male here for ophthalmic surgery.   Past Medical History:  Diagnosis Date   ADD (attention deficit disorder)    Allergy    seasonal flares of perinneal allergies   Cancer ([place])    melanoma   Cardiac arrest ([place]) 07/1997   aborted   Dementia ([place])    post resusitative   Depression    Diverticulosis    Family hx of prostate cancer    GERD (gastroesophageal reflux disease)    Headache(784.0)    Hyperlipidemia    Hypothyroidism    ICD (implantable cardiac defibrillator) in place    Idiopathic urticaria    Internal hemorrhoids    Low back pain syndrome    Melanoma in situ of skin of trunk ([place])    chest   PVC (premature ventricular contraction)    associated with cardiac arrest   Sleep apnea    no c-pap   Tubular adenoma of colon 10/2010    Past Surgical History:  Procedure Laterality Date    implantation x2     CARDIAC DEFIBRILLATOR PLACEMENT  07/01/2010   Explanation of a previously implanted device, pocket revision, and insertion of a new device, and intraoperative defibrillation threshold testing Larry [name])   CARDIAC DEFIBRILLATOR REMOVAL  2004   Defibrillator change Larry [name]) 3 times changed   HEMORRHOID SURGERY     ICD     TONSILLECTOMY      Prior to Admission medications   Medication Sig Start Date End Date Taking? Authorizing Provider  amphetamine-dextroamphetamine (ADDERALL) 10 MG tablet Take 1 tablet (10 mg total) by mouth 2 (two) times daily. 01/16/21  Yes [name], MD  Ascorbic Acid (VITAMIN C) 500 MG tablet Take 500 mg by mouth daily.   Yes [provider]  aspirin 81 MG tablet Take 81 mg by mouth daily.   Yes [provider]  diltiazem (CARDIZEM CD) 120 MG 24 hr capsule TAKE 1 CAPSULE BY MOUTH  DAILY 01/06/21  Yes Sherren Mocha, MD  donepezil (ARICEPT)  10 MG tablet TAKE 1 TABLET BY MOUTH  DAILY 01/06/21  Yes Marcial Pacas, MD  fexofenadine (ALLEGRA) 180 MG tablet Take 180 mg by mouth daily.   Yes [provider]  Ginkgo Biloba 40 MG TABS Take by mouth as directed.   Yes [provider]  lamoTRIgine (LAMICTAL) 100 MG tablet TAKE 1 TABLET BY MOUTH  TWICE DAILY 01/06/21  Yes Marcial Pacas, MD  levothyroxine (SYNTHROID) 137 MCG tablet Take 1 tablet (137 mcg total) by mouth daily before breakfast. 12/10/20  Yes [name], MD  Melatonin 5 MG CAPS Take by mouth as directed.   Yes [provider]  memantine (NAMENDA) 10 MG tablet TAKE 1 TABLET BY MOUTH  TWICE DAILY 01/06/21  Yes Marcial Pacas, MD  metoprolol succinate (TOPROL XL) 25 MG 24 hr tablet Take 1 tablet (25 mg total) by mouth daily. 04/24/20  Yes [name], MD  montelukast (SINGULAIR) 10 MG tablet TAKE 1 TABLET BY MOUTH  DAILY 09/29/20  Yes [name], MD  Multiple Vitamin (MULTIVITAMIN) tablet Take 1 tablet by mouth daily.   Yes [provider]  NON FORMULARY Focus factor 1 tab po bid   Yes [provider]  rosuvastatin (CRESTOR) 20 MG tablet Take 1 tablet (20 mg total) by mouth at bedtime.  10/07/20  Yes [name], MD    Allergies as of 01/15/2021 - Review Complete 12/16/2020  Allergen Reaction Noted   Penicillins  08/17/2006    Family History  Problem Relation Age of Onset   Hypertension Mother    Heart disease Father    Diverticulosis Father    Coronary artery disease Father    Alopecia Father    Prostate cancer Brother    Lung cancer Paternal Grandfather        Smoker   Cancer Paternal Grandfather        lung cancer - smoker   Diabetes Brother    Heart disease Brother        heart transplant   Colon cancer Neg Hx     Social History   Socioeconomic History   Marital status: Married    Spouse name: Larry [name]   Number of children: 2   Years of education: College   Highest education level: Not on file  Occupational  History    Employer: DISABLED  Tobacco Use   Smoking status: Former    Packs/day: 1.00    Years: 10.00    Pack years: 10.00    Types: Cigarettes    Quit date: 07/06/1995    Years since quitting: 25.6   Smokeless tobacco: Former    Types: Chew    Quit date: 11/04/1994  Vaping Use   Vaping Use: Never used  Substance and Sexual Activity   Alcohol use: No    Alcohol/week: 0.0 standard drinks    Comment: socially   Drug use: No   Sexual activity: Not on file  Other Topics Concern   Not on file  Social History Narrative   Patient is married Larry [name]) [phone] with 2 [name]   5 grandchildren   Daily caffeine use - one cup of coffee and one-two cokes per day   Patient is right-handed.   Patient has a college education   Social Determinants of Radio broadcast assistant Strain: Low Risk    Difficulty of Paying Living Expenses: Not hard at all  Food Insecurity: No Food Insecurity   Worried About Charity fundraiser in the Last Year: Never true   Arboriculturist in the Last Year: Never true  Transportation Needs: No Transportation Needs   Lack of Transportation (Medical): No   Lack of Transportation (Non-Medical): No  Physical Activity: Inactive   Days of Exercise per Week: 0 days   Minutes of Exercise per Session: 0 min  Stress: No Stress Concern Present   Feeling of Stress : Not at all  Social Connections: Not on file  Intimate Partner Violence: Not At Risk   Fear of Current or Ex-Partner: No   Emotionally Abused: No   Physically Abused: No   Sexually Abused: No    Review of Systems: See HPI, otherwise negative ROS  Physical Exam: BP (!) 142/90   [name] 67   Temp 97.7 F (36.5 C)   Ht '5\' 9"'$  (1.753 m)   Wt 80.7 kg   SpO2 97%   BMI 26.29 kg/m  General:   Alert,  pleasant and cooperative in NAD Head:  Normocephalic and atraumatic. Lungs:  Clear to auscultation.    Heart:  Regular rate and rhythm.   Impression/Plan: Larry [name] is here for ophthalmic  surgery.  Risks, benefits, limitations, and alternatives regarding ophthalmic surgery have been reviewed with the patient.  Questions have been answered.  All parties agreeable.   Leandrew Koyanagi,  MD  02/04/2021, 11:49 AM

## 2021-02-04 NOTE — Transfer of Care (Signed)
Immediate Anesthesia Transfer of Care Note  Patient: Larry [name]  Procedure(s) Performed: CATARACT EXTRACTION PHACO AND INTRAOCULAR LENS PLACEMENT (IOC) LEFT VIVITY toric LENS 9.21 01:11.6 (Left: Eye)  Patient Location: PACU  Anesthesia Type: General  Level of Consciousness: awake, alert  and patient cooperative  Airway and Oxygen Therapy: Patient Spontanous Breathing and Patient connected to supplemental oxygen  Post-op Assessment: Post-op Vital signs reviewed, Patient's Cardiovascular Status Stable, Respiratory Function Stable, Patent Airway and No signs of Nausea or vomiting  Post-op Vital Signs: Reviewed and stable  Complications: No notable events documented.

## 2021-02-04 NOTE — Anesthesia Preprocedure Evaluation (Signed)
Anesthesia Evaluation  Patient identified by MRN, date of birth, ID band Patient awake    Reviewed: Allergy & Precautions, H&P , NPO status , Patient's Chart, lab work & pertinent test results, reviewed documented beta blocker date and time   Airway Mallampati: II  TM Distance: >3 FB Neck ROM: full    Dental no notable dental hx.    Pulmonary sleep apnea , former smoker,    Pulmonary exam normal breath sounds clear to auscultation       Cardiovascular Exercise Tolerance: Good hypertension, + Cardiac Defibrillator  Rhythm:regular Rate:Normal  Cardiac arrest 1999   Neuro/Psych  Headaches, Seizures -,  PSYCHIATRIC DISORDERS    GI/Hepatic Neg liver ROS, GERD  ,  Endo/Other  Hypothyroidism   Renal/GU negative Renal ROS  negative genitourinary   Musculoskeletal   Abdominal   Peds  Hematology negative hematology ROS (+)   Anesthesia Other Findings   Reproductive/Obstetrics negative OB ROS                             Anesthesia Physical Anesthesia Plan  ASA: 3  Anesthesia Plan: General   Post-op Pain Management:    Induction:   PONV Risk Score and Plan:   Airway Management Planned:   Additional Equipment:   Intra-op Plan:   Post-operative Plan:   Informed Consent: I have reviewed the patients History and Physical, chart, labs and discussed the procedure including the risks, benefits and alternatives for the proposed anesthesia with the patient or authorized representative who has indicated his/her understanding and acceptance.     Dental Advisory Given  Plan Discussed with: CRNA  Anesthesia Plan Comments:         Anesthesia Quick Evaluation

## 2021-02-05 ENCOUNTER — Ambulatory Visit: Payer: Medicare Other

## 2021-02-09 ENCOUNTER — Ambulatory Visit: Payer: Medicare Other

## 2021-02-09 ENCOUNTER — Other Ambulatory Visit: Payer: Self-pay

## 2021-02-09 DIAGNOSIS — R2681 Unsteadiness on feet: Secondary | ICD-10-CM | POA: Diagnosis not present

## 2021-02-09 DIAGNOSIS — R262 Difficulty in walking, not elsewhere classified: Secondary | ICD-10-CM

## 2021-02-09 DIAGNOSIS — R278 Other lack of coordination: Secondary | ICD-10-CM

## 2021-02-09 DIAGNOSIS — M6281 Muscle weakness (generalized): Secondary | ICD-10-CM | POA: Diagnosis not present

## 2021-02-09 DIAGNOSIS — R269 Unspecified abnormalities of gait and mobility: Secondary | ICD-10-CM

## 2021-02-09 DIAGNOSIS — R296 Repeated falls: Secondary | ICD-10-CM | POA: Diagnosis not present

## 2021-02-09 NOTE — Therapy (Signed)
[place] MAIN [facility] SERVICES [address] [place], [place], [address] Phone: [phone]   Fax:  [phone]  Physical Therapy Treatment  Patient Details  Name: Larry [name] MRN: [id number] Date of Birth: 1950/10/11 Referring Provider (PT): Dr. Krista Blue   Encounter Date: 02/09/2021   PT End of Session - 02/09/21 1657     Visit Number 3    Number of Visits 25    Date for PT Re-Evaluation 04/21/21    Authorization Time Period 01/27/2021- 04/21/2021    PT Start Time 1016    PT Stop Time 1058    PT Time Calculation (min) 42 min    Equipment Utilized During Treatment Gait belt    Activity Tolerance Patient tolerated treatment well    Behavior During Therapy [facility] for tasks assessed/performed             Past Medical History:  Diagnosis Date   ADD (attention deficit disorder)    Allergy    seasonal flares of perinneal allergies   Cancer ([place])    melanoma   Cardiac arrest ([place]) 07/1997   aborted   Dementia ([place])    post resusitative   Depression    Diverticulosis    Family hx of prostate cancer    GERD (gastroesophageal reflux disease)    Headache(784.0)    Hyperlipidemia    Hypothyroidism    ICD (implantable cardiac defibrillator) in place    Idiopathic urticaria    Internal hemorrhoids    Low back pain syndrome    Melanoma in situ of skin of trunk ([place])    chest   PVC (premature ventricular contraction)    associated with cardiac arrest   Sleep apnea    no c-pap   Tubular adenoma of colon 10/2010    Past Surgical History:  Procedure Laterality Date    implantation x2     CARDIAC DEFIBRILLATOR PLACEMENT  07/01/2010   Explanation of a previously implanted device, pocket revision, and insertion of a new device, and intraoperative defibrillation threshold testing Caryl Comes)   CARDIAC DEFIBRILLATOR REMOVAL  2004   Defibrillator change Caryl Comes) 3 times changed   CATARACT EXTRACTION W/PHACO Left 02/04/2021   Procedure: CATARACT  EXTRACTION PHACO AND INTRAOCULAR LENS PLACEMENT ([place]) LEFT VIVITY toric LENS 9.21 01:11.6;  Surgeon: Leandrew Koyanagi, MD;  Location: [place];  Service: Ophthalmology;  Laterality: Left;  keep patient at 11:30 arrival   [place]      There were no vitals filed for this visit.   Subjective Assessment - 02/09/21 1019     Subjective Patient reports having cataract removal last week and doing okay so far.    Patient is accompained by: Family member    Pertinent History Per note submitted by Dr. Krista Blue on 12/15/2020- The patient has an underlying medical history of cardiac death from ventricular fibrillation with hypoxic brain injury in Jan 1999 due to arrythmia, 15 minutes without heart beat,  status post defibrillator placement, He stayed in hospital for 35 days. On 12/16/2020-Gait abnormality              Also related to the previous anoxic brain injury, deconditioning,    Limitations Walking;House hold activities    How long can you sit comfortably? no limitations    How long can you stand comfortably? Not really limited    How long can you walk comfortably? 15-20    Patient Stated Goals To  improve transfers, balance and walking.    Currently in Pain? No/denies            Interventions:  Neuromuscular re-education: Instructed patient in beginner level balance activities for home program: *All activities performed in the //bars today:  Static standing with varying feet position:   Feet apart- Eyes Open (EO) x 20 sec - no unsteadiness Feet apart- Eyes closed (EC) x 20 sec- No unsteadiness Feet apart- EO with head turns and nods x 5 each direction- No unsteadiness Feet apart - EC with head turns and nods x 5 each direction- Min increase in sway - Using ankle strategy with no LOB.  Feet together- EO x 20 sec- no issues Feet together- EC x 20 sec - min increase sway - using ankle/hip strategy to maintain balance.  Feet together- EO with  head nods/turning x 5 each - Min sway Feet together - EC- x 20 sec x trials- Increased a/p sway with 1 LOB requring 1 UE support  Feet staggered - same as feet together except significant difficulty with EC  Static stand on foam with feet apart, Narrowed, and staggered with EO, then EC x20 sec x 3 trials each.   Education provided throughout session via VC/TC and demonstration to facilitate movement at target joints and correct muscle activation for all testing and exercises performed.       Access Code: [email or web address] URL: [email or web address] Date: 02/09/2021 Prepared by: Sande Brothers  Exercises Romberg Stance with Eyes Closed - 1 x daily - 3 x weekly - 3 sets - 20 hold Standing Romberg to 1/2 Tandem Stance - 1 x daily - 3 x weekly - 3 sets - 20 hold Romberg Stance Eyes Closed on Foam Pad - 1 x daily - 3 x weekly - 3 sets - 20 hold Standing with Head Rotation - 1 x daily - 3 x weekly - 3 sets - 20 hold Romberg Stance with Head Nods - 1 x daily - 3 x weekly - 3 sets - 20 hold Wide Tandem Stance on Foam Pad with Eyes Open - 1 x daily - 3 x weekly - 3 sets - 20 hold Foam Balance Narrow stance - 1 x daily - 3 x weekly - 3 sets - 20 hold     Clinical Impression: Patient responded well to initiation of low level static balance activities and able to follow handout for instruction in home program today. He was instructed to always start with eyes open and once that activity is fairly simple with no LOB then to try activity with eyes closed. He presents as highly motivated today and able to follow all activities with VC, TC and visual demo. Pt will benefit from continued skilled PT services to address deficits in LE strength, mobility, and balance to restore independence with household and community mobility and decrease risk for future falls                   PT Education - 02/09/21 1020     Education Details balance ex education    Person(s) Educated  Patient    Methods Explanation;Demonstration;Tactile cues;Verbal cues    Comprehension Verbalized understanding;Returned demonstration;Verbal cues required;Tactile cues required;Need further instruction              PT Short Term Goals - 01/27/21 1011       PT SHORT TERM GOAL #1   Title Pt will be independent with HEP in order to improve strength and balance in order to  decrease fall risk and improve function at home and work.    Baseline 01/27/2021- Patient presents with no formal HEP in place    Time 6    Period Weeks    Status New    Target Date 03/10/21               PT Long Term Goals - 02/03/21 0855       PT LONG TERM GOAL #1   Title Pt will improve BERG by at least 5 points in order to demonstrate clinically significant improvement in balance.    Baseline 01/27/2021= 43/56    Time 12    Period Weeks    Status New    Target Date 04/21/21      PT LONG TERM GOAL #2   Title Pt will improve FOTO to target score of 55% to display perceived improvements in ability to complete ADL's.    Baseline 01/27/2021= 42%    Time 12    Period Weeks    Status New    Target Date 04/21/21      PT LONG TERM GOAL #3   Title Pt will decrease 5TSTS by at least 3 seconds in order to demonstrate clinically significant improvement in LE strength.    Baseline 01/27/2021= 30.07 without UE support    Time 12    Period Weeks    Status New    Target Date 04/21/21      PT LONG TERM GOAL #4   Title Pt will decrease TUG to below 15  seconds/decrease in order to demonstrate decreased fall risk.    Baseline 01/27/2021= 20.97 without an AD    Time 12    Period Weeks    Status New    Target Date 04/21/21      PT LONG TERM GOAL #5   Title Pt will increase 10MWT by at least 0.13 m/s in order to demonstrate clinically significant improvement in community ambulation.    Baseline 01/27/2021= 0.67 m/s without an AD    Time 12    Period Weeks    Status New    Target Date 04/21/21       Additional Long Term Goals   Additional Long Term Goals Yes      PT LONG TERM GOAL #6   Title Pt will increase 6MWT by at least 11m(1634f in order to demonstrate clinically significant improvement in cardiopulmonary endurance and community ambulation    Baseline 02/03/2021= 1100 feet    Time 11    Period Weeks    Status New    Target Date 04/21/21                   Plan - 02/09/21 1655     Clinical Impression Statement Patient responded well to initiation of low level static balance activities and able to follow handout for instruction in home program today. He was instructed to always start with eyes open and once that activity is fairly simple with no LOB then to try activity with eyes closed. He presents as highly motivated today and able to follow all activities with VC, TC and visual demo. Pt will benefit from continued skilled PT services to address deficits in LE strength, mobility, and balance to restore independence with household and community mobility and decrease risk for future falls.    Personal Factors and Comorbidities Comorbidity 3+    Comorbidities Anoxic brain injury (1999), Cardiac (pacemaker/debrillator), HTN    Examination-Activity Limitations Bathing;Caring for Others;Lift;Stairs;Transfers  Examination-Participation Restrictions Community Activity;Yard Work    Stability/Clinical Decision Making Stable/Uncomplicated    Rehab Potential Good    PT Frequency 2x / week    PT Duration 12 weeks    PT Treatment/Interventions ADLs/Self Care Home Management;Cryotherapy;Moist Heat;DME Instruction;Gait training;Stair training;Functional mobility training;Therapeutic activities;Therapeutic exercise;Balance training;Neuromuscular re-education;Patient/family education;Manual techniques;Passive range of motion;Dry needling    PT Next Visit Plan Progress LE strengthening next visit and continue with  balance exercises.    PT Home Exercise Plan ?Access Code: [email or web address]  URL:  [email or web address]    Consulted and Agree with Plan of Care Patient;Family member/caregiver    Family Member Consulted Wife- [name]             Patient will benefit from skilled therapeutic intervention in order to improve the following deficits and impairments:  Abnormal gait, Decreased activity tolerance, Decreased balance, Decreased cognition, Decreased coordination, Decreased endurance, Decreased mobility, Decreased strength, Difficulty walking  Visit Diagnosis: Abnormality of gait and mobility  Difficulty in walking, not elsewhere classified  Muscle weakness (generalized)  Other lack of coordination     Problem List Patient Active Problem List   Diagnosis Date Noted   Gait abnormality 12/16/2020   Nonintractable headache 09/30/2020   Tinnitus 09/09/2019   History of sudden cardiac arrest 01/09/2019   Viral syndrome 09/06/2018   Seizures ([place]) 03/02/2018   Syncope 12/26/2017   Other social stressor 12/26/2017   Health care maintenance 08/14/2017   Family hx of prostate cancer    Allergy    Insomnia 10/20/2015   Advance care planning 03/08/2014   Memory loss 09/14/2013   Hypoxic brain injury ([place]) 09/14/2013   Medicare annual wellness visit, subsequent 02/16/2013   Shortness of breath 08/10/2011   Implantable cardioverter-defibrillator (ICD) in situ 02/03/2009   Hypothyroidism 11/18/2008   CONTACT DERMATITIS&OTHER ECZEMA DUE TO PLANTS 02/03/2008   IDIOPATHIC URTICARIA 02/03/2008   Hypertension 02/03/2008   HLD (hyperlipidemia) 2007-11-11   Attention deficit disorder Nov 11, 2007   PVC/VT non sustained Nov 11, 2007   Sleep apnea Nov 11, 2007   HEADACHE 11-11-07   SUDDEN DEATH-aborted 2007/11/11   LOW BACK PAIN SYNDROME 10/11/2007    Lewis Moccasin, PT 02/09/2021, 4:59 PM  [place] MAIN [facility] SERVICES [address] [place], [place], [address] Phone: [phone]   Fax:  [phone]  Name: Larry [name] MRN: [id number] Date of Birth: 06/18/1951

## 2021-02-10 DIAGNOSIS — H2511 Age-related nuclear cataract, right eye: Secondary | ICD-10-CM | POA: Diagnosis not present

## 2021-02-11 ENCOUNTER — Encounter: Payer: Self-pay | Admitting: Ophthalmology

## 2021-02-11 ENCOUNTER — Encounter: Payer: Self-pay | Admitting: Anesthesiology

## 2021-02-12 ENCOUNTER — Other Ambulatory Visit: Payer: Self-pay

## 2021-02-12 ENCOUNTER — Ambulatory Visit: Payer: Medicare Other

## 2021-02-12 DIAGNOSIS — R2681 Unsteadiness on feet: Secondary | ICD-10-CM | POA: Diagnosis not present

## 2021-02-12 DIAGNOSIS — R269 Unspecified abnormalities of gait and mobility: Secondary | ICD-10-CM

## 2021-02-12 DIAGNOSIS — R278 Other lack of coordination: Secondary | ICD-10-CM

## 2021-02-12 DIAGNOSIS — R296 Repeated falls: Secondary | ICD-10-CM | POA: Diagnosis not present

## 2021-02-12 DIAGNOSIS — M6281 Muscle weakness (generalized): Secondary | ICD-10-CM | POA: Diagnosis not present

## 2021-02-12 DIAGNOSIS — R262 Difficulty in walking, not elsewhere classified: Secondary | ICD-10-CM | POA: Diagnosis not present

## 2021-02-12 NOTE — Therapy (Signed)
[place] MAIN [facility] SERVICES [address] [place], [place], [address] Phone: [phone]   Fax:  [phone]  Physical Therapy Treatment  Patient Details  Name: Larry [name] MRN: [id number] Date of Birth: 09-Sep-1950 Referring Provider (PT): Dr. Krista Blue   Encounter Date: 02/12/2021   PT End of Session - 02/12/21 1305     Visit Number 4    Number of Visits 25    Date for PT Re-Evaluation 04/21/21    Authorization Time Period 01/27/2021- 04/21/2021    PT Start Time 1142    PT Stop Time 1225    PT Time Calculation (min) 43 min    Equipment Utilized During Treatment Gait belt    Activity Tolerance Patient tolerated treatment well    Behavior During Therapy [facility] for tasks assessed/performed             Past Medical History:  Diagnosis Date   ADD (attention deficit disorder)    Allergy    seasonal flares of perinneal allergies   Cancer ([place])    melanoma   Cardiac arrest ([place]) 07/1997   aborted   Dementia ([place])    post resusitative   Depression    Diverticulosis    Family hx of prostate cancer    GERD (gastroesophageal reflux disease)    Headache(784.0)    Hyperlipidemia    Hypothyroidism    ICD (implantable cardiac defibrillator) in place    Idiopathic urticaria    Internal hemorrhoids    Low back pain syndrome    Melanoma in situ of skin of trunk ([place])    chest   PVC (premature ventricular contraction)    associated with cardiac arrest   Sleep apnea    no c-pap   Tubular adenoma of colon 10/2010    Past Surgical History:  Procedure Laterality Date    implantation x2     CARDIAC DEFIBRILLATOR PLACEMENT  07/01/2010   Explanation of a previously implanted device, pocket revision, and insertion of a new device, and intraoperative defibrillation threshold testing Caryl Comes)   CARDIAC DEFIBRILLATOR REMOVAL  2004   Defibrillator change Caryl Comes) 3 times changed   CATARACT EXTRACTION W/PHACO Left 02/04/2021   Procedure: CATARACT  EXTRACTION PHACO AND INTRAOCULAR LENS PLACEMENT ([place]) LEFT VIVITY toric LENS 9.21 01:11.6;  Surgeon: Leandrew Koyanagi, MD;  Location: [place];  Service: Ophthalmology;  Laterality: Left;  keep patient at 11:30 arrival   [place]      There were no vitals filed for this visit.   Subjective Assessment - 02/12/21 1300     Subjective Patient reports compliant so far to date with strengthening and balance.    Patient is accompained by: Family member    Pertinent History Per note submitted by Dr. Krista Blue on 12/15/2020- The patient has an underlying medical history of cardiac death from ventricular fibrillation with hypoxic brain injury in Jan 1999 due to arrythmia, 15 minutes without heart beat,  status post defibrillator placement, He stayed in hospital for 35 days. On 12/16/2020-Gait abnormality              Also related to the previous anoxic brain injury, deconditioning,    Limitations Walking;House hold activities    How long can you sit comfortably? no limitations    How long can you stand comfortably? Not really limited    How long can you walk comfortably? 15-20    Patient Stated Goals To improve  transfers, balance and walking.    Currently in Pain? Yes    Pain Score --   Neck pain- did not rate today.   Pain Location Neck    Pain Orientation Right;Left;Posterior;Upper    Pain Descriptors / Indicators Aching    Pain Type Chronic pain    Pain Onset More than a month ago    Pain Frequency Constant    Pain Relieving Factors Rest    Effect of Pain on Daily Activities Difficulty with turning head           Interventions:   Neuromuscular re-education: (Performed in //bars)   Step tap (6" step) without UE support x 15 reps each LE.- No LOB- Mild unsteadiness.   Step up (6" step) without UE support x 12 reps each LE- Occasional reach with 1 UE for support.   Side side over 1/2 foam  without UE support x 15 each side  Forward/backward  step over 1/2 foam  without UE support x 15 each direction. Mild difficulty with steppage   Standing on airex pad while dynamically stepping with one foot onto green therapad x 15 steps each leg  Standing on 6"step then dynamically stepping down to blue airex pad then back up x 10 reps each leg.   Dynamic standing (1 foot on 6"step and the other in front on a blue airex pad) - instructed to twist with upper body left to right x 10 reps then switch x 10 reps. Patient with increased unsteadiness yet no loss of balance.   Therapeutic Exercises:   Instructed patient in wall posture stretch- hold 60 sec x 3 sets. VC and visual demo for head position.   Wall stretch with B shoulder abd (maintaining Arms against wall) x 12 reps x 2 sets.   Education provided throughout session via VC/TC and demonstration to facilitate movement at target joints and correct muscle activation for all testing and exercises performed.    Clinical Impression: Patient performed well with balance activities with initial unsteadiness - mostly reliant on ankle strategy- able to adapt quickly. Patient responded well to all verbal cues with balance and postural activities. He remains well motivated and able to follow all cues well with limited rest. Pt will benefit from continued skilled PT services to address deficits in LE strength, mobility, and balance to restore independence with household and community mobility and decrease risk for future falls.                        PT Education - 02/12/21 1303     Education Details Exercise/posture technique    Person(s) Educated Patient    Methods Explanation;Demonstration;Tactile cues;Verbal cues    Comprehension Verbalized understanding;Returned demonstration;Verbal cues required;Tactile cues required;Need further instruction              PT Short Term Goals - 01/27/21 1011       PT SHORT TERM GOAL #1   Title Pt will be independent with HEP in order  to improve strength and balance in order to decrease fall risk and improve function at home and work.    Baseline 01/27/2021- Patient presents with no formal HEP in place    Time 6    Period Weeks    Status New    Target Date 03/10/21               PT Long Term Goals - 02/03/21 0855       PT LONG TERM  GOAL #1   Title Pt will improve BERG by at least 5 points in order to demonstrate clinically significant improvement in balance.    Baseline 01/27/2021= 43/56    Time 12    Period Weeks    Status New    Target Date 04/21/21      PT LONG TERM GOAL #2   Title Pt will improve FOTO to target score of 55% to display perceived improvements in ability to complete ADL's.    Baseline 01/27/2021= 42%    Time 12    Period Weeks    Status New    Target Date 04/21/21      PT LONG TERM GOAL #3   Title Pt will decrease 5TSTS by at least 3 seconds in order to demonstrate clinically significant improvement in LE strength.    Baseline 01/27/2021= 30.07 without UE support    Time 12    Period Weeks    Status New    Target Date 04/21/21      PT LONG TERM GOAL #4   Title Pt will decrease TUG to below 15  seconds/decrease in order to demonstrate decreased fall risk.    Baseline 01/27/2021= 20.97 without an AD    Time 12    Period Weeks    Status New    Target Date 04/21/21      PT LONG TERM GOAL #5   Title Pt will increase 10MWT by at least 0.13 m/s in order to demonstrate clinically significant improvement in community ambulation.    Baseline 01/27/2021= 0.67 m/s without an AD    Time 12    Period Weeks    Status New    Target Date 04/21/21      Additional Long Term Goals   Additional Long Term Goals Yes      PT LONG TERM GOAL #6   Title Pt will increase 6MWT by at least 57m(1666f in order to demonstrate clinically significant improvement in cardiopulmonary endurance and community ambulation    Baseline 02/03/2021= 1100 feet    Time 11    Period Weeks    Status New    Target Date  04/21/21                   Plan - 02/12/21 1305     Clinical Impression Statement Patient performed well with balance activities with initial unsteadiness - mostly reliant on ankle strategy- able to adapt quickly. Patient responded well to all verbal cues with balance and postural activities. He remains well motivated and able to follow all cues well with limited rest. Pt will benefit from continued skilled PT services to address deficits in LE strength, mobility, and balance to restore independence with household and community mobility and decrease risk for future falls.    Personal Factors and Comorbidities Comorbidity 3+    Comorbidities Anoxic brain injury (1999), Cardiac (pacemaker/debrillator), HTN    Examination-Activity Limitations Bathing;Caring for Others;Lift;Stairs;Transfers    Examination-Participation Restrictions Community Activity;Yard Work    Stability/Clinical Decision Making Stable/Uncomplicated    Rehab Potential Good    PT Frequency 2x / week    PT Duration 12 weeks    PT Treatment/Interventions ADLs/Self Care Home Management;Cryotherapy;Moist Heat;DME Instruction;Gait training;Stair training;Functional mobility training;Therapeutic activities;Therapeutic exercise;Balance training;Neuromuscular re-education;Patient/family education;Manual techniques;Passive range of motion;Dry needling    PT Next Visit Plan Progress LE strengthening next visit and continue with  balance exercises.    PT Home Exercise Plan No changes this visit    Consulted and  Agree with Plan of Care Patient;Family member/caregiver    Family Member Consulted Wife- [name]             Patient will benefit from skilled therapeutic intervention in order to improve the following deficits and impairments:  Abnormal gait, Decreased activity tolerance, Decreased balance, Decreased cognition, Decreased coordination, Decreased endurance, Decreased mobility, Decreased strength, Difficulty walking  Visit  Diagnosis: Abnormality of gait and mobility  Difficulty in walking, not elsewhere classified  Muscle weakness (generalized)  Other lack of coordination     Problem List Patient Active Problem List   Diagnosis Date Noted   Gait abnormality 12/16/2020   Nonintractable headache 09/30/2020   Tinnitus 09/09/2019   History of sudden cardiac arrest 01/09/2019   Viral syndrome 09/06/2018   Seizures ([place]) 03/02/2018   Syncope 12/26/2017   Other social stressor 12/26/2017   Health care maintenance 08/14/2017   Family hx of prostate cancer    Allergy    Insomnia 10/20/2015   Advance care planning 03/08/2014   Memory loss 09/14/2013   Hypoxic brain injury ([place]) 09/14/2013   Medicare annual wellness visit, subsequent 02/16/2013   Shortness of breath 08/10/2011   Implantable cardioverter-defibrillator (ICD) in situ 02/03/2009   Hypothyroidism 11/18/2008   CONTACT DERMATITIS&OTHER ECZEMA DUE TO PLANTS 02/03/2008   IDIOPATHIC URTICARIA 02/03/2008   Hypertension 02/03/2008   HLD (hyperlipidemia) Oct 28, 2007   Attention deficit disorder 2007/10/28   PVC/VT non sustained 10-28-07   Sleep apnea 28-Oct-2007   HEADACHE 2007/10/28   SUDDEN DEATH-aborted 2007/10/28   LOW BACK PAIN SYNDROME 10/11/2007    Lewis Moccasin, PT 02/12/2021, 1:26 PM  [place] MAIN [facility] SERVICES [address] [place], [place], [address] Phone: [phone]   Fax:  [phone]  Name: Larry [name] MRN: [id number] Date of Birth: 17-May-1951

## 2021-02-16 ENCOUNTER — Ambulatory Visit: Payer: Medicare Other

## 2021-02-16 NOTE — Discharge Instructions (Signed)

## 2021-02-18 ENCOUNTER — Ambulatory Visit: Admission: RE | Admit: 2021-02-18 | Payer: Medicare Other | Source: Home / Self Care | Admitting: Ophthalmology

## 2021-02-18 SURGERY — PHACOEMULSIFICATION, CATARACT, WITH IOL INSERTION
Anesthesia: Topical | Laterality: Right

## 2021-02-19 ENCOUNTER — Ambulatory Visit: Payer: Medicare Other

## 2021-02-23 ENCOUNTER — Encounter: Payer: Self-pay | Admitting: Gastroenterology

## 2021-02-23 ENCOUNTER — Other Ambulatory Visit: Payer: Self-pay

## 2021-02-23 ENCOUNTER — Ambulatory Visit: Payer: Medicare Other

## 2021-02-23 DIAGNOSIS — R269 Unspecified abnormalities of gait and mobility: Secondary | ICD-10-CM | POA: Diagnosis not present

## 2021-02-23 DIAGNOSIS — M6281 Muscle weakness (generalized): Secondary | ICD-10-CM | POA: Diagnosis not present

## 2021-02-23 DIAGNOSIS — R262 Difficulty in walking, not elsewhere classified: Secondary | ICD-10-CM | POA: Diagnosis not present

## 2021-02-23 DIAGNOSIS — R278 Other lack of coordination: Secondary | ICD-10-CM

## 2021-02-23 DIAGNOSIS — R2681 Unsteadiness on feet: Secondary | ICD-10-CM

## 2021-02-23 DIAGNOSIS — R296 Repeated falls: Secondary | ICD-10-CM | POA: Diagnosis not present

## 2021-02-23 NOTE — Therapy (Signed)
[place] MAIN [facility] SERVICES [address] [place], [place], [address] Phone: [phone]   Fax:  [phone]  Physical Therapy Treatment  Patient Details  Name: Larry [name] MRN: [id number] Date of Birth: Nov 05, 1950 Referring Provider (PT): Dr. Krista Blue   Encounter Date: 02/23/2021   PT End of Session - 02/23/21 1014     Visit Number 5    Number of Visits 25    Date for PT Re-Evaluation 04/21/21    Authorization Time Period 01/27/2021- 04/21/2021    PT Start Time 1013    PT Stop Time 1058    PT Time Calculation (min) 45 min    Equipment Utilized During Treatment Gait belt    Activity Tolerance Patient tolerated treatment well    Behavior During Therapy [facility] for tasks assessed/performed             Past Medical History:  Diagnosis Date   ADD (attention deficit disorder)    Allergy    seasonal flares of perinneal allergies   Cancer ([place])    melanoma   Cardiac arrest ([place]) 07/1997   aborted   Dementia ([place])    post resusitative   Depression    Diverticulosis    Family hx of prostate cancer    GERD (gastroesophageal reflux disease)    Headache(784.0)    Hyperlipidemia    Hypothyroidism    ICD (implantable cardiac defibrillator) in place    Idiopathic urticaria    Internal hemorrhoids    Low back pain syndrome    Melanoma in situ of skin of trunk ([place])    chest   PVC (premature ventricular contraction)    associated with cardiac arrest   Sleep apnea    no c-pap   Tubular adenoma of colon 10/2010    Past Surgical History:  Procedure Laterality Date    implantation x2     CARDIAC DEFIBRILLATOR PLACEMENT  07/01/2010   Explanation of a previously implanted device, pocket revision, and insertion of a new device, and intraoperative defibrillation threshold testing Caryl Comes)   CARDIAC DEFIBRILLATOR REMOVAL  2004   Defibrillator change Caryl Comes) 3 times changed   CATARACT EXTRACTION W/PHACO Left 02/04/2021   Procedure: CATARACT  EXTRACTION PHACO AND INTRAOCULAR LENS PLACEMENT ([place]) LEFT VIVITY toric LENS 9.21 01:11.6;  Surgeon: Leandrew Koyanagi, MD;  Location: [place];  Service: Ophthalmology;  Laterality: Left;  keep patient at 11:30 arrival   [place]      There were no vitals filed for this visit.   Subjective Assessment - 02/23/21 1013     Subjective I feel okay- Neck is still bothering me about a 5-6/10 but overall doing better. I have been doing my exercises.    Patient is accompained by: Family member    Pertinent History Per note submitted by Dr. Krista Blue on 12/15/2020- The patient has an underlying medical history of cardiac death from ventricular fibrillation with hypoxic brain injury in Jan 1999 due to arrythmia, 15 minutes without heart beat,  status post defibrillator placement, He stayed in hospital for 35 days. On 12/16/2020-Gait abnormality              Also related to the previous anoxic brain injury, deconditioning,    Limitations Walking;House hold activities    How long can you sit comfortably? no limitations    How long can you stand comfortably? Not really limited    How long can you walk  comfortably? 15-20    Patient Stated Goals To improve transfers, balance and walking.    Currently in Pain? Yes    Pain Score 6     Pain Location Neck    Pain Orientation Posterior    Pain Descriptors / Indicators Aching;Sore    Pain Type Chronic pain    Pain Onset More than a month ago    Pain Frequency Constant    Aggravating Factors  active movement of my neck    Pain Relieving Factors Rest    Effect of Pain on Daily Activities Difficulty with turning my head.             Interventions:  Neuromuscular re-education: (Performed in //bars)   Step tap (6" step) without UE support x 15 reps each LE.- No LOB- Mild unsteadiness. Patient rated as medium  Step up (6" step) without UE support x 12 reps each LE- Occasional reach with 1 UE for support.  Patient reports and medium/hard. Patient presents with increased fatigue.   Side side over 1/2 foam  without UE support x 12 each side- Mild difficulty coordinating movement.   Forward/backward step over 1/2 foam  without UE support x 15 each direction. Mild difficulty with clearing foam on retro step.   Dynamic standing (tandem on 1/2 white foam) - attempting to hold 10-20 sec x 3 trials each. Multiple attempts yet patient did improve with each trial.   Therapeutic Exercises:   Instructed in AROM Cervical:   Rotation with use of towel SB sitting in chair.  Flex/ext  (See below HEP for details)   Access Code: [email or web address] URL: [email or web address] Date: 02/23/2021 Prepared by: Sande Brothers  Exercises Seated Assisted Cervical Rotation with Towel - 1 x daily - 7 x weekly - 4 sets - 20-30 hold Seated Cervical Sidebending Stretch - 1 x daily - 7 x weekly - 4 sets - 20-30 hold Seated Cervical Flexion AROM - 1 x daily - 7 x weekly - 4 sets - 20 hold Cervical Extension AROM with Strap - 1 x daily - 7 x weekly - 4 sets - 20 hold  Clinical impression: Patient responded favorably to instruction in AROM cervical motions - able to return demo and verbalized understanding of HEP. He was challenged with all neuro re-ed exercises- difficulty with coordination and requiring UE at times for support. Pt will benefit from continued skilled PT services to address deficits in LE strength, mobility, and balance to restore independence with household and community mobility and decrease risk for future falls                            PT Education - 02/23/21 1014     Education Details Exercise technique    Person(s) Educated Patient;Spouse;Child(ren);Parent(s)    Methods Explanation;Demonstration;Tactile cues;Verbal cues    Comprehension Verbalized understanding;Returned demonstration;Verbal cues required;Tactile cues required;Need further instruction               PT Short Term Goals - 01/27/21 1011       PT SHORT TERM GOAL #1   Title Pt will be independent with HEP in order to improve strength and balance in order to decrease fall risk and improve function at home and work.    Baseline 01/27/2021- Patient presents with no formal HEP in place    Time 6    Period Weeks    Status New    Target Date 03/10/21  PT Long Term Goals - 02/03/21 0855       PT LONG TERM GOAL #1   Title Pt will improve BERG by at least 5 points in order to demonstrate clinically significant improvement in balance.    Baseline 01/27/2021= 43/56    Time 12    Period Weeks    Status New    Target Date 04/21/21      PT LONG TERM GOAL #2   Title Pt will improve FOTO to target score of 55% to display perceived improvements in ability to complete ADL's.    Baseline 01/27/2021= 42%    Time 12    Period Weeks    Status New    Target Date 04/21/21      PT LONG TERM GOAL #3   Title Pt will decrease 5TSTS by at least 3 seconds in order to demonstrate clinically significant improvement in LE strength.    Baseline 01/27/2021= 30.07 without UE support    Time 12    Period Weeks    Status New    Target Date 04/21/21      PT LONG TERM GOAL #4   Title Pt will decrease TUG to below 15  seconds/decrease in order to demonstrate decreased fall risk.    Baseline 01/27/2021= 20.97 without an AD    Time 12    Period Weeks    Status New    Target Date 04/21/21      PT LONG TERM GOAL #5   Title Pt will increase 10MWT by at least 0.13 m/s in order to demonstrate clinically significant improvement in community ambulation.    Baseline 01/27/2021= 0.67 m/s without an AD    Time 12    Period Weeks    Status New    Target Date 04/21/21      Additional Long Term Goals   Additional Long Term Goals Yes      PT LONG TERM GOAL #6   Title Pt will increase 6MWT by at least 29m(1680f in order to demonstrate clinically significant improvement in cardiopulmonary  endurance and community ambulation    Baseline 02/03/2021= 1100 feet    Time 11    Period Weeks    Status New    Target Date 04/21/21                   Plan - 02/23/21 1014     Clinical Impression Statement Patient responded favorably to instruction in AROM cervical motions - able to return demo and verbalized understanding of HEP. He was challenged with all neuro re-ed exercises- difficulty with coordination and requiring UE at times for support. Pt will benefit from continued skilled PT services to address deficits in LE strength, mobility, and balance to restore independence with household and community mobility and decrease risk for future falls    Personal Factors and Comorbidities Comorbidity 3+    Comorbidities Anoxic brain injury (1999), Cardiac (pacemaker/debrillator), HTN    Examination-Activity Limitations Bathing;Caring for Others;Lift;Stairs;Transfers    Examination-Participation Restrictions Community Activity;Yard Work    Stability/Clinical Decision Making Stable/Uncomplicated    Rehab Potential Good    PT Frequency 2x / week    PT Duration 12 weeks    PT Treatment/Interventions ADLs/Self Care Home Management;Cryotherapy;Moist Heat;DME Instruction;Gait training;Stair training;Functional mobility training;Therapeutic activities;Therapeutic exercise;Balance training;Neuromuscular re-education;Patient/family education;Manual techniques;Passive range of motion;Dry needling    PT Next Visit Plan Progress LE strengthening next visit and continue with  balance exercises.    PT Home Exercise Plan No  changes this visit    Consulted and Agree with Plan of Care Patient;Family member/caregiver    Family Member Consulted Wife- [name]             Patient will benefit from skilled therapeutic intervention in order to improve the following deficits and impairments:  Abnormal gait, Decreased activity tolerance, Decreased balance, Decreased cognition, Decreased coordination,  Decreased endurance, Decreased mobility, Decreased strength, Difficulty walking  Visit Diagnosis: Abnormality of gait and mobility  Difficulty in walking, not elsewhere classified  Muscle weakness (generalized)  Other lack of coordination  Unsteadiness on feet     Problem List Patient Active Problem List   Diagnosis Date Noted   Gait abnormality 12/16/2020   Nonintractable headache 09/30/2020   Tinnitus 09/09/2019   History of sudden cardiac arrest 01/09/2019   Viral syndrome 09/06/2018   Seizures ([place]) 03/02/2018   Syncope 12/26/2017   Other social stressor 12/26/2017   Health care maintenance 08/14/2017   Family hx of prostate cancer    Allergy    Insomnia 10/20/2015   Advance care planning 03/08/2014   Memory loss 09/14/2013   Hypoxic brain injury ([place]) 09/14/2013   Medicare annual wellness visit, subsequent 02/16/2013   Shortness of breath 08/10/2011   Implantable cardioverter-defibrillator (ICD) in situ 02/03/2009   Hypothyroidism 11/18/2008   CONTACT DERMATITIS&OTHER ECZEMA DUE TO PLANTS 02/03/2008   IDIOPATHIC URTICARIA 02/03/2008   Hypertension 02/03/2008   HLD (hyperlipidemia) Oct 28, 2007   Attention deficit disorder 28-Oct-2007   PVC/VT non sustained 2007-10-28   Sleep apnea 28-Oct-2007   HEADACHE 2007-10-28   SUDDEN DEATH-aborted October 28, 2007   LOW BACK PAIN SYNDROME 10/11/2007    Lewis Moccasin, PT 02/23/2021, 5:09 PM  [place] MAIN [facility] SERVICES [address] [place], [place], [address] Phone: [phone]   Fax:  [phone]  Name: Larry [name] MRN: [id number] Date of Birth: May 20, 1951

## 2021-02-25 ENCOUNTER — Encounter: Payer: Self-pay | Admitting: Ophthalmology

## 2021-02-25 ENCOUNTER — Other Ambulatory Visit: Payer: Self-pay

## 2021-02-25 ENCOUNTER — Ambulatory Visit: Payer: Medicare Other

## 2021-02-25 DIAGNOSIS — R278 Other lack of coordination: Secondary | ICD-10-CM | POA: Diagnosis not present

## 2021-02-25 DIAGNOSIS — R296 Repeated falls: Secondary | ICD-10-CM | POA: Diagnosis not present

## 2021-02-25 DIAGNOSIS — R262 Difficulty in walking, not elsewhere classified: Secondary | ICD-10-CM | POA: Diagnosis not present

## 2021-02-25 DIAGNOSIS — R2681 Unsteadiness on feet: Secondary | ICD-10-CM | POA: Diagnosis not present

## 2021-02-25 DIAGNOSIS — M6281 Muscle weakness (generalized): Secondary | ICD-10-CM | POA: Diagnosis not present

## 2021-02-25 DIAGNOSIS — R269 Unspecified abnormalities of gait and mobility: Secondary | ICD-10-CM | POA: Diagnosis not present

## 2021-02-25 NOTE — Therapy (Signed)
[place] MAIN [facility] SERVICES [address] [place], [place], [address] Phone: [phone]   Fax:  [phone]  Physical Therapy Treatment  Patient Details  Name: Larry [name] MRN: [id number] Date of Birth: [birth year] Referring Provider (PT): Dr. Krista Blue   Encounter Date: 02/25/2021   PT End of Session - 02/25/21 1307     Visit Number 6    Number of Visits 25    Date for PT Re-Evaluation 04/21/21    Authorization Time Period 01/27/2021- 04/21/2021    PT Start Time 1300    PT Stop Time 1344    PT Time Calculation (min) 44 min    Equipment Utilized During Treatment Gait belt    Activity Tolerance Patient tolerated treatment well    Behavior During Therapy [facility] for tasks assessed/performed             Past Medical History:  Diagnosis Date   ADD (attention deficit disorder)    Allergy    seasonal flares of perinneal allergies   Cancer ([place])    melanoma   Cardiac arrest ([place]) 07/1997   aborted   Dementia ([place])    post resusitative   Depression    Diverticulosis    Family hx of prostate cancer    GERD (gastroesophageal reflux disease)    Headache(784.0)    Hyperlipidemia    Hypothyroidism    ICD (implantable cardiac defibrillator) in place    Idiopathic urticaria    Internal hemorrhoids    Low back pain syndrome    Melanoma in situ of skin of trunk ([place])    chest   PVC (premature ventricular contraction)    associated with cardiac arrest   Sleep apnea    no c-pap   Tubular adenoma of colon 10/2010    Past Surgical History:  Procedure Laterality Date    implantation x2     CARDIAC DEFIBRILLATOR PLACEMENT  07/01/2010   Explanation of a previously implanted device, pocket revision, and insertion of a new device, and intraoperative defibrillation threshold testing Caryl Comes)   CARDIAC DEFIBRILLATOR REMOVAL  2004   Defibrillator change Caryl Comes) 3 times changed   CATARACT EXTRACTION W/PHACO Left 02/04/2021   Procedure: CATARACT  EXTRACTION PHACO AND INTRAOCULAR LENS PLACEMENT ([place]) LEFT VIVITY toric LENS 9.21 01:11.6;  Surgeon: Leandrew Koyanagi, MD;  Location: [place];  Service: Ophthalmology;  Laterality: Left;  keep patient at 11:30 arrival   [place]      There were no vitals filed for this visit.   Subjective Assessment - 02/25/21 1305     Subjective Feeling pretty good today- no neck pain today which is surprising.    Patient is accompained by: Family member    Pertinent History Per note submitted by Dr. Krista Blue on 12/15/2020- The patient has an underlying medical history of cardiac death from ventricular fibrillation with hypoxic brain injury in Jan 1999 due to arrythmia, 15 minutes without heart beat,  status post defibrillator placement, He stayed in hospital for 35 days. On 12/16/2020-Gait abnormality              Also related to the previous anoxic brain injury, deconditioning,    Limitations Walking;House hold activities    How long can you sit comfortably? no limitations    How long can you stand comfortably? Not really limited    How long can you walk comfortably? 15-20    Patient Stated Goals To improve  transfers, balance and walking.    Currently in Pain? No/denies    Pain Onset More than a month ago                 Interventions:  Neuromuscular re-education: (Performed in //bars)   Step tap (6" step) without UE support x 15 reps each LE.- No LOB- Mild unsteadiness. Patient rated as medium  Step marching on blue airex pad without UE support x 15 reps. Initially unsteady requiring some UE Support - did improve with practice.   Standing on blue foam with 4 hedeghogs position- 2 in front and 1 on each side with instruction - called out color and patient tapped the hog x 2 min. Then progressed to calling out which leg and color for more cognitive component.   Standing on blue airex pad and stepping forward with one leg onto another airex pad and  then back. *Patient unsteady reaching for //bars initially- progressed to no UE support  Standing on blue airex pad with dynamic head turning x 5 (within pain free limits) -No UE support  Standing on blue airex pad holding onto 2kg ball with upper trunk twist - Left and then right x 15 reps - unsteady yet no loss of balance.   Side side over 1/2 foam  without UE support x 12 each side- Mild difficulty coordinating movement- cues to take a wider step   Dynamic standing (tandem on 1/2 white foam) - attempting to hold 10-20 sec x 3 trials each. Multiple attempts yet patient did improve with each trial.  Education provided throughout session via VC/TC and demonstration to facilitate movement at target joints and correct muscle activation for all testing and exercises performed.   Clinical Impression: Patient performed well today with practice and VC - able to participate and progress through balance exercises and improve from initial unsteadiness to much improved balance with practice. He presents with excellent motivation for all tasks today and to progress accordingly- demo improved dynamic balance today. Pt will benefit from continued skilled PT services to address deficits in LE strength, mobility, and balance to restore independence with household and community mobility and decrease risk for future falls                       PT Education - 02/25/21 1306     Education Details Exercise technique    Person(s) Educated Patient    Methods Explanation;Demonstration;Tactile cues;Verbal cues    Comprehension Verbalized understanding;Returned demonstration;Verbal cues required;Tactile cues required;Need further instruction              PT Short Term Goals - 01/27/21 1011       PT SHORT TERM GOAL #1   Title Pt will be independent with HEP in order to improve strength and balance in order to decrease fall risk and improve function at home and work.    Baseline 01/27/2021-  Patient presents with no formal HEP in place    Time 6    Period Weeks    Status New    Target Date 03/10/21               PT Long Term Goals - 02/03/21 0855       PT LONG TERM GOAL #1   Title Pt will improve BERG by at least 5 points in order to demonstrate clinically significant improvement in balance.    Baseline 01/27/2021= 43/56    Time 12    Period Weeks  Status New    Target Date 04/21/21      PT LONG TERM GOAL #2   Title Pt will improve FOTO to target score of 55% to display perceived improvements in ability to complete ADL's.    Baseline 01/27/2021= 42%    Time 12    Period Weeks    Status New    Target Date 04/21/21      PT LONG TERM GOAL #3   Title Pt will decrease 5TSTS by at least 3 seconds in order to demonstrate clinically significant improvement in LE strength.    Baseline 01/27/2021= 30.07 without UE support    Time 12    Period Weeks    Status New    Target Date 04/21/21      PT LONG TERM GOAL #4   Title Pt will decrease TUG to below 15  seconds/decrease in order to demonstrate decreased fall risk.    Baseline 01/27/2021= 20.97 without an AD    Time 12    Period Weeks    Status New    Target Date 04/21/21      PT LONG TERM GOAL #5   Title Pt will increase 10MWT by at least 0.13 m/s in order to demonstrate clinically significant improvement in community ambulation.    Baseline 01/27/2021= 0.67 m/s without an AD    Time 12    Period Weeks    Status New    Target Date 04/21/21      Additional Long Term Goals   Additional Long Term Goals Yes      PT LONG TERM GOAL #6   Title Pt will increase 6MWT by at least 48m(1626f in order to demonstrate clinically significant improvement in cardiopulmonary endurance and community ambulation    Baseline 02/03/2021= 1100 feet    Time 11    Period Weeks    Status New    Target Date 04/21/21                   Plan - 02/25/21 1307     Clinical Impression Statement Patient performed well today  with practice and VC - able to participate and progress through balance exercises and improve from initial unsteadiness to much improved balance with practice. He presents with excellent motivation for all tasks today and to progress accordingly- demo improved dynamic balance today. Pt will benefit from continued skilled PT services to address deficits in LE strength, mobility, and balance to restore independence with household and community mobility and decrease risk for future falls    Personal Factors and Comorbidities Comorbidity 3+    Comorbidities Anoxic brain injury (1999), Cardiac (pacemaker/debrillator), HTN    Examination-Activity Limitations Bathing;Caring for Others;Lift;Stairs;Transfers    Examination-Participation Restrictions Community Activity;Yard Work    Stability/Clinical Decision Making Stable/Uncomplicated    Rehab Potential Good    PT Frequency 2x / week    PT Duration 12 weeks    PT Treatment/Interventions ADLs/Self Care Home Management;Cryotherapy;Moist Heat;DME Instruction;Gait training;Stair training;Functional mobility training;Therapeutic activities;Therapeutic exercise;Balance training;Neuromuscular re-education;Patient/family education;Manual techniques;Passive range of motion;Dry needling    PT Next Visit Plan Progress LE strengthening next visit and continue with  balance exercises.    PT Home Exercise Plan No changes this visit    Consulted and Agree with Plan of Care Patient;Family member/caregiver    Family Member Consulted Wife- KaJuliann Pulse           Patient will benefit from skilled therapeutic intervention in order to improve the following deficits  and impairments:  Abnormal gait, Decreased activity tolerance, Decreased balance, Decreased cognition, Decreased coordination, Decreased endurance, Decreased mobility, Decreased strength, Difficulty walking  Visit Diagnosis: Abnormality of gait and mobility  Difficulty in walking, not elsewhere  classified  Muscle weakness (generalized)  Unsteadiness on feet     Problem List Patient Active Problem List   Diagnosis Date Noted   Gait abnormality 12/16/2020   Nonintractable headache 09/30/2020   Tinnitus 09/09/2019   History of sudden cardiac arrest 01/09/2019   Viral syndrome 09/06/2018   Seizures ([place]) 03/02/2018   Syncope 12/26/2017   Other social stressor 12/26/2017   Health care maintenance 08/14/2017   Family hx of prostate cancer    Allergy    Insomnia 10/20/2015   Advance care planning 03/08/2014   Memory loss 09/14/2013   Hypoxic brain injury ([place]) 09/14/2013   Medicare annual wellness visit, subsequent 02/16/2013   Shortness of breath 08/10/2011   Implantable cardioverter-defibrillator (ICD) in situ 02/03/2009   Hypothyroidism 11/18/2008   CONTACT DERMATITIS&OTHER ECZEMA DUE TO PLANTS 02/03/2008   IDIOPATHIC URTICARIA 02/03/2008   Hypertension 02/03/2008   HLD (hyperlipidemia) 11/21/2007   Attention deficit disorder 11/21/07   PVC/VT non sustained 2007-11-21   Sleep apnea 21-Nov-2007   HEADACHE 11/21/2007   SUDDEN DEATH-aborted 21-Nov-2007   LOW BACK PAIN SYNDROME 10/11/2007    Lewis Moccasin, PT 02/25/2021, 2:06 PM  [place] MAIN [facility] SERVICES [address] [place], [place], [address] Phone: [phone]   Fax:  [phone]  Name: JAQUILLE STICKLE MRN: [id number] Date of Birth: 1951/02/19

## 2021-02-26 ENCOUNTER — Ambulatory Visit: Payer: Medicare Other

## 2021-03-02 ENCOUNTER — Other Ambulatory Visit: Payer: Self-pay

## 2021-03-02 ENCOUNTER — Ambulatory Visit: Payer: Medicare Other | Admitting: Physical Therapy

## 2021-03-02 DIAGNOSIS — M6281 Muscle weakness (generalized): Secondary | ICD-10-CM | POA: Diagnosis not present

## 2021-03-02 DIAGNOSIS — R278 Other lack of coordination: Secondary | ICD-10-CM | POA: Diagnosis not present

## 2021-03-02 DIAGNOSIS — R2681 Unsteadiness on feet: Secondary | ICD-10-CM | POA: Diagnosis not present

## 2021-03-02 DIAGNOSIS — R296 Repeated falls: Secondary | ICD-10-CM

## 2021-03-02 DIAGNOSIS — R262 Difficulty in walking, not elsewhere classified: Secondary | ICD-10-CM | POA: Diagnosis not present

## 2021-03-02 DIAGNOSIS — R269 Unspecified abnormalities of gait and mobility: Secondary | ICD-10-CM

## 2021-03-02 NOTE — Therapy (Signed)
[place] MAIN [facility] SERVICES [address] [place], [place], [address] Phone: [phone]   Fax:  [phone]  Physical Therapy Treatment  Patient Details  Name: Larry [name] MRN: [id number] Date of Birth: 11/10/1950 Referring Provider (PT): Dr. Krista Blue   Encounter Date: 03/02/2021   PT End of Session - 03/02/21 1539     Visit Number 7    Number of Visits 25    Date for PT Re-Evaluation 04/21/21    Authorization Time Period 01/27/2021- 04/21/2021    PT Start Time 1018    PT Stop Time 1102    PT Time Calculation (min) 44 min    Equipment Utilized During Treatment Gait belt    Activity Tolerance Patient tolerated treatment well    Behavior During Therapy [facility] for tasks assessed/performed             Past Medical History:  Diagnosis Date   ADD (attention deficit disorder)    Allergy    seasonal flares of perinneal allergies   Cancer ([place])    melanoma   Cardiac arrest ([place]) 07/1997   aborted   Dementia ([place])    post resusitative   Depression    Diverticulosis    Family hx of prostate cancer    GERD (gastroesophageal reflux disease)    Headache(784.0)    Hyperlipidemia    Hypothyroidism    ICD (implantable cardiac defibrillator) in place    Idiopathic urticaria    Internal hemorrhoids    Low back pain syndrome    Melanoma in situ of skin of trunk ([place])    chest   PVC (premature ventricular contraction)    associated with cardiac arrest   Sleep apnea    no c-pap   Tubular adenoma of colon 10/2010    Past Surgical History:  Procedure Laterality Date    implantation x2     CARDIAC DEFIBRILLATOR PLACEMENT  07/01/2010   Explanation of a previously implanted device, pocket revision, and insertion of a new device, and intraoperative defibrillation threshold testing Caryl Comes)   CARDIAC DEFIBRILLATOR REMOVAL  2004   Defibrillator change Caryl Comes) 3 times changed   CATARACT EXTRACTION W/PHACO Left 02/04/2021   Procedure: CATARACT  EXTRACTION PHACO AND INTRAOCULAR LENS PLACEMENT ([place]) LEFT VIVITY toric LENS 9.21 01:11.6;  Surgeon: Leandrew Koyanagi, MD;  Location: [place];  Service: Ophthalmology;  Laterality: Left;  keep patient at 11:30 arrival   [place]      There were no vitals filed for this visit.   Subjective Assessment - 03/02/21 1021     Subjective Feeling pretty good today. Pt fell on Friday, he tripped on the hose outside and hit his head on a brick. Reports no headaches, changes in vision. Wife states he was "dazed" for an hour or 2 after the fall. A friend of theirs who is a retired Therapist, sports assessed pt after fall and reported no concern of concussion.    Patient is accompained by: Family member    Pertinent History Per note submitted by Dr. Krista Blue on 12/15/2020- The patient has an underlying medical history of cardiac death from ventricular fibrillation with hypoxic brain injury in Jan 1999 due to arrythmia, 15 minutes without heart beat,  status post defibrillator placement, He stayed in hospital for 35 days. On 12/16/2020-Gait abnormality              Also related to the previous anoxic brain injury, deconditioning,  Limitations Walking;House hold activities    How long can you sit comfortably? no limitations    How long can you stand comfortably? Not really limited    How long can you walk comfortably? 15-20    Patient Stated Goals To improve transfers, balance and walking.    Currently in Pain? No/denies              Interventions:  Neuromuscular re-education: (Performed in //bars)    Step tap (6" step) without UE support 2 x 15 reps each LE.- No LOB- Mild unsteadiness. Patient rated as medium. VC on lighter tap for improved control.   Heel raises 2 x 15 reps no UE support;  Standing on blue foam with 2 hedeghogs positioned laterally, 1 on each side, with instruction to alternate lateral taps. VC on softer tap for improved control, 2 x 1 min.     Soccer - stop ball and kick; pt wife rolling ball towards pt, PT providing CGA to steady. Performance and balance improved with increased reps. X3 minutes, BLE involvement (wife rolling ball left/right to encourage equal participation).   Lateral steps - VC on increased step height, CGA to steady. PT provided pacing for increased cadence. 3 step L, 3 steps R x 2 minutes.   Lateral steps with soccer stop and kick. Occasional reminder on sequencing. Difficulty with cognitive component due to multiple steps. X3 minutes.   Uneven surfaces - 6" block and blue airex with hedgehog pass for trunk rotation, x10 reps in each direction, BLE; VC on posture and increased rotation.   Uneven surfaces - 6" block and blue airex pad with upward gaze x30 seconds, BLE.  Walk with emphasis on lateral weight shift (diagonal steps, i.e. "monster walk") 3-5 second SLS hold with each step x15 BLE, CGA to steady.   Education provided throughout session via VC/TC and demonstration to facilitate movement at target joints and correct muscle activation for all testing and exercises performed.     Clinical Impression: Pt arrived with excellent motivation to today's session. He did report a fall last Friday (3 days ago) in which he tripped over the hose outside and hit his had on a brick. No subsequent symptoms indicating concussion. PT challenged pt with static and dynamic balance exercises, incorporated coordination and multi-step command following. Increased processing time with multi-step exercises and occasional VC on sequencing as reminders. Pt will benefit from continued skilled PT services to address deficits in LE strength, mobility, and balance to restore independence with household and community mobility and decrease risk for future falls.       PT Short Term Goals - 01/27/21 1011       PT SHORT TERM GOAL #1   Title Pt will be independent with HEP in order to improve strength and balance in order to decrease  fall risk and improve function at home and work.    Baseline 01/27/2021- Patient presents with no formal HEP in place    Time 6    Period Weeks    Status New    Target Date 03/10/21               PT Long Term Goals - 02/03/21 0855       PT LONG TERM GOAL #1   Title Pt will improve BERG by at least 5 points in order to demonstrate clinically significant improvement in balance.    Baseline 01/27/2021= 43/56    Time 12    Period Weeks    Status  New    Target Date 04/21/21      PT LONG TERM GOAL #2   Title Pt will improve FOTO to target score of 55% to display perceived improvements in ability to complete ADL's.    Baseline 01/27/2021= 42%    Time 12    Period Weeks    Status New    Target Date 04/21/21      PT LONG TERM GOAL #3   Title Pt will decrease 5TSTS by at least 3 seconds in order to demonstrate clinically significant improvement in LE strength.    Baseline 01/27/2021= 30.07 without UE support    Time 12    Period Weeks    Status New    Target Date 04/21/21      PT LONG TERM GOAL #4   Title Pt will decrease TUG to below 15  seconds/decrease in order to demonstrate decreased fall risk.    Baseline 01/27/2021= 20.97 without an AD    Time 12    Period Weeks    Status New    Target Date 04/21/21      PT LONG TERM GOAL #5   Title Pt will increase 10MWT by at least 0.13 m/s in order to demonstrate clinically significant improvement in community ambulation.    Baseline 01/27/2021= 0.67 m/s without an AD    Time 12    Period Weeks    Status New    Target Date 04/21/21      Additional Long Term Goals   Additional Long Term Goals Yes      PT LONG TERM GOAL #6   Title Pt will increase 6MWT by at least 42m(1658f in order to demonstrate clinically significant improvement in cardiopulmonary endurance and community ambulation    Baseline 02/03/2021= 1100 feet    Time 11    Period Weeks    Status New    Target Date 04/21/21                   Plan -  03/02/21 1543     Clinical Impression Statement Pt arrived with excellent motivation to today's session. He did report a fall last Friday (3 days ago) in which he tripped over the hose outside and hit his had on a brick. No subsequent symptoms indicating concussion. PT challenged pt with static and dynamic balance exercises, incorporated coordination and multi-step command following. Increased processing time with multi-step exercises and occasional VC on sequencing as reminders. Pt will benefit from continued skilled PT services to address deficits in LE strength, mobility, and balance to restore independence with household and community mobility and decrease risk for future falls.    Personal Factors and Comorbidities Comorbidity 3+    Comorbidities Anoxic brain injury (1999), Cardiac (pacemaker/debrillator), HTN    Examination-Activity Limitations Bathing;Caring for Others;Lift;Stairs;Transfers    Examination-Participation Restrictions Community Activity;Yard Work    Stability/Clinical Decision Making Stable/Uncomplicated    Rehab Potential Good    PT Frequency 2x / week    PT Duration 12 weeks    PT Treatment/Interventions ADLs/Self Care Home Management;Cryotherapy;Moist Heat;DME Instruction;Gait training;Stair training;Functional mobility training;Therapeutic activities;Therapeutic exercise;Balance training;Neuromuscular re-education;Patient/family education;Manual techniques;Passive range of motion;Dry needling    PT Next Visit Plan Progress LE strengthening next visit and continue with  balance exercises.    PT Home Exercise Plan No changes this visit    Consulted and Agree with Plan of Care Patient;Family member/caregiver    Family Member Consulted Wife- KaJuliann Pulse  Patient will benefit from skilled therapeutic intervention in order to improve the following deficits and impairments:  Abnormal gait, Decreased activity tolerance, Decreased balance, Decreased cognition, Decreased  coordination, Decreased endurance, Decreased mobility, Decreased strength, Difficulty walking  Visit Diagnosis: Abnormality of gait and mobility  Difficulty in walking, not elsewhere classified  Muscle weakness (generalized)  Unsteadiness on feet  Other lack of coordination  Repeated falls     Problem List Patient Active Problem List   Diagnosis Date Noted   Gait abnormality 12/16/2020   Nonintractable headache 09/30/2020   Tinnitus 09/09/2019   History of sudden cardiac arrest 01/09/2019   Viral syndrome 09/06/2018   Seizures ([place]) 03/02/2018   Syncope 12/26/2017   Other social stressor 12/26/2017   Health care maintenance 08/14/2017   Family hx of prostate cancer    Allergy    Insomnia 10/20/2015   Advance care planning 03/08/2014   Memory loss 09/14/2013   Hypoxic brain injury ([place]) 09/14/2013   Medicare annual wellness visit, subsequent 02/16/2013   Shortness of breath 08/10/2011   Implantable cardioverter-defibrillator (ICD) in situ 02/03/2009   Hypothyroidism 11/18/2008   CONTACT DERMATITIS&OTHER ECZEMA DUE TO PLANTS 02/03/2008   IDIOPATHIC URTICARIA 02/03/2008   Hypertension 02/03/2008   HLD (hyperlipidemia) Nov 22, 2007   Attention deficit disorder 11/22/2007   PVC/VT non sustained Nov 22, 2007   Sleep apnea 11-22-07   HEADACHE 11/22/07   SUDDEN DEATH-aborted Nov 22, 2007   LOW BACK PAIN SYNDROME 10/11/2007    Patrina Levering PT, DPT  [facility] 03/02/2021, 5:25 PM  [place] MAIN [facility] SERVICES [address] [place], [place], [address] Phone: [phone]   Fax:  [phone]  Name: Larry [name] MRN: [id number] Date of Birth: 10-06-1950

## 2021-03-02 NOTE — Anesthesia Preprocedure Evaluation (Addendum)
Anesthesia Evaluation  Patient identified by MRN, date of birth, ID band Patient awake    Reviewed: Allergy & Precautions, NPO status , Patient's Chart, lab work & pertinent test results  History of Anesthesia Complications Negative for: history of anesthetic complications  Airway Mallampati: IV   Neck ROM: Full  Mouth opening: Limited Mouth Opening  Dental no notable dental hx.    Pulmonary sleep apnea , former smoker (quit 1997),    Pulmonary exam normal breath sounds clear to auscultation       Cardiovascular hypertension, Normal cardiovascular exam+ Cardiac Defibrillator  Rhythm:Regular Rate:Normal     Neuro/Psych  Headaches, Seizures - (last 3 years ago),  PSYCHIATRIC DISORDERS (ADD) Depression    GI/Hepatic negative GI ROS,   Endo/Other  Hypothyroidism   Renal/GU negative Renal ROS     Musculoskeletal   Abdominal   Peds  Hematology Melanoma    Anesthesia Other Findings   Reproductive/Obstetrics                            Anesthesia Physical Anesthesia Plan  ASA: 3  Anesthesia Plan: MAC   Post-op Pain Management:    Induction: Intravenous  PONV Risk Score and Plan: 1 and TIVA, Midazolam and Treatment may vary due to age or medical condition  Airway Management Planned: Nasal Cannula  Additional Equipment:   Intra-op Plan:   Post-operative Plan:   Informed Consent: I have reviewed the patients History and Physical, chart, labs and discussed the procedure including the risks, benefits and alternatives for the proposed anesthesia with the patient or authorized representative who has indicated his/her understanding and acceptance.       Plan Discussed with: CRNA  Anesthesia Plan Comments:        Anesthesia Quick Evaluation

## 2021-03-03 NOTE — Discharge Instructions (Signed)

## 2021-03-04 ENCOUNTER — Other Ambulatory Visit: Payer: Self-pay

## 2021-03-04 ENCOUNTER — Ambulatory Visit: Payer: Medicare Other | Admitting: Anesthesiology

## 2021-03-04 ENCOUNTER — Encounter: Payer: Self-pay | Admitting: Ophthalmology

## 2021-03-04 ENCOUNTER — Ambulatory Visit
Admission: RE | Admit: 2021-03-04 | Discharge: 2021-03-04 | Disposition: A | Discharge status: Home / Self Care | Payer: Medicare Other | Attending: Ophthalmology | Admitting: Ophthalmology

## 2021-03-04 ENCOUNTER — Encounter
Admission: RE | Disposition: A | Discharge status: Home / Self Care | Payer: Self-pay | Source: Home / Self Care | Attending: Ophthalmology

## 2021-03-04 DIAGNOSIS — Z85820 Personal history of malignant melanoma of skin: Secondary | ICD-10-CM | POA: Insufficient documentation

## 2021-03-04 DIAGNOSIS — H2511 Age-related nuclear cataract, right eye: Secondary | ICD-10-CM | POA: Insufficient documentation

## 2021-03-04 DIAGNOSIS — Z87891 Personal history of nicotine dependence: Secondary | ICD-10-CM | POA: Insufficient documentation

## 2021-03-04 DIAGNOSIS — Z880 Allergy status to penicillin: Secondary | ICD-10-CM | POA: Insufficient documentation

## 2021-03-04 DIAGNOSIS — Z7982 Long term (current) use of aspirin: Secondary | ICD-10-CM | POA: Insufficient documentation

## 2021-03-04 DIAGNOSIS — Z79899 Other long term (current) drug therapy: Secondary | ICD-10-CM | POA: Insufficient documentation

## 2021-03-04 DIAGNOSIS — Z79890 Hormone replacement therapy (postmenopausal): Secondary | ICD-10-CM | POA: Diagnosis not present

## 2021-03-04 DIAGNOSIS — Z95810 Presence of automatic (implantable) cardiac defibrillator: Secondary | ICD-10-CM | POA: Insufficient documentation

## 2021-03-04 DIAGNOSIS — H25811 Combined forms of age-related cataract, right eye: Secondary | ICD-10-CM | POA: Diagnosis not present

## 2021-03-04 HISTORY — PX: CATARACT EXTRACTION W/PHACO: SHX586

## 2021-03-04 SURGERY — PHACOEMULSIFICATION, CATARACT, WITH IOL INSERTION
Anesthesia: Monitor Anesthesia Care | Site: Eye | Laterality: Right

## 2021-03-04 MED ORDER — CYCLOPENTOLATE HCL 2 % OP SOLN
1.0000 [drp] | OPHTHALMIC | Status: DC | PRN
  Administered 2021-03-04 (×3): 1 [drp] via OPHTHALMIC

## 2021-03-04 MED ORDER — SIGHTPATH DOSE#1 BSS IO SOLN
INTRAOCULAR | Status: DC | PRN
  Administered 2021-03-04: 68 mL via OPHTHALMIC

## 2021-03-04 MED ORDER — LACTATED RINGERS IV SOLN: INTRAVENOUS | Status: DC

## 2021-03-04 MED ORDER — PHENYLEPHRINE HCL 10 % OP SOLN
1.0000 [drp] | OPHTHALMIC | Status: DC | PRN
  Administered 2021-03-04 (×3): 1 [drp] via OPHTHALMIC

## 2021-03-04 MED ORDER — TETRACAINE HCL 0.5 % OP SOLN
1.0000 [drp] | OPHTHALMIC | Status: DC | PRN
  Administered 2021-03-04 (×3): 1 [drp] via OPHTHALMIC

## 2021-03-04 MED ORDER — SIGHTPATH DOSE#1 NA HYALUR & NA CHOND-NA HYALUR IO KIT
PACK | INTRAOCULAR | Status: DC | PRN
  Administered 2021-03-04: 1 via OPHTHALMIC

## 2021-03-04 MED ORDER — ACETAMINOPHEN 325 MG PO TABS: 650.0000 mg | ORAL_TABLET | Freq: Once | ORAL | Status: DC | PRN

## 2021-03-04 MED ORDER — FENTANYL CITRATE (PF) 100 MCG/2ML IJ SOLN
INTRAMUSCULAR | Status: DC | PRN
  Administered 2021-03-04: 50 ug via INTRAVENOUS

## 2021-03-04 MED ORDER — MIDAZOLAM HCL 2 MG/2ML IJ SOLN
INTRAMUSCULAR | Status: DC | PRN
  Administered 2021-03-04: 2 mg via INTRAVENOUS

## 2021-03-04 MED ORDER — BRIMONIDINE TARTRATE-TIMOLOL 0.2-0.5 % OP SOLN
OPHTHALMIC | Status: DC | PRN
  Administered 2021-03-04: 1 [drp] via OPHTHALMIC

## 2021-03-04 MED ORDER — MOXIFLOXACIN HCL 0.5 % OP SOLN
OPHTHALMIC | Status: DC | PRN
  Administered 2021-03-04: 0.2 mL via OPHTHALMIC

## 2021-03-04 MED ORDER — ACETAMINOPHEN 160 MG/5ML PO SOLN: 325.0000 mg | ORAL | Status: DC | PRN

## 2021-03-04 MED ORDER — SIGHTPATH DOSE#1 BSS IO SOLN
INTRAOCULAR | Status: DC | PRN
  Administered 2021-03-04: 15 mL

## 2021-03-04 MED ORDER — ONDANSETRON HCL 4 MG/2ML IJ SOLN: 4.0000 mg | Freq: Once | INTRAMUSCULAR | Status: DC | PRN

## 2021-03-04 SURGICAL SUPPLY — 21 items
CANNULA ANT/CHMB 27G (MISCELLANEOUS) ×1 IMPLANT
CANNULA ANT/CHMB 27GA (MISCELLANEOUS) ×2 IMPLANT
GLOVE SRG 8 PF TXTR STRL LF DI (GLOVE) ×1 IMPLANT
GLOVE SURG ENC TEXT LTX SZ7.5 (GLOVE) ×2 IMPLANT
GLOVE SURG UNDER POLY LF SZ8 (GLOVE) ×2
GOWN STRL REUS W/ TWL LRG LVL3 (GOWN DISPOSABLE) ×2 IMPLANT
GOWN STRL REUS W/TWL LRG LVL3 (GOWN DISPOSABLE) ×4
LENS IOL ACRSF IQ VT 15 24.0 IMPLANT
LENS IOL ACRYSOF VIVITY 24.0 ×2 IMPLANT
LENS IOL VIVITY 015 24.0 ×1 IMPLANT
MARKER SKIN DUAL TIP RULER LAB (MISCELLANEOUS) ×2 IMPLANT
NDL CAPSULORHEX 25GA (NEEDLE) ×1 IMPLANT
NDL FILTER BLUNT 18X1 1/2 (NEEDLE) ×2 IMPLANT
NEEDLE CAPSULORHEX 25GA (NEEDLE) ×2 IMPLANT
NEEDLE FILTER BLUNT 18X 1/2SAF (NEEDLE) ×2
NEEDLE FILTER BLUNT 18X1 1/2 (NEEDLE) ×2 IMPLANT
PACK EYE AFTER SURG (MISCELLANEOUS) ×2 IMPLANT
SYR 3ML LL SCALE MARK (SYRINGE) ×4 IMPLANT
SYR TB 1ML LUER SLIP (SYRINGE) ×2 IMPLANT
WATER STERILE IRR 250ML POUR (IV SOLUTION) ×2 IMPLANT
WIPE NON LINTING 3.25X3.25 (MISCELLANEOUS) ×2 IMPLANT

## 2021-03-04 NOTE — Anesthesia Postprocedure Evaluation (Signed)
Anesthesia Post Note  Patient: Larry [name]  Procedure(s) Performed: CATARACT EXTRACTION PHACO AND INTRAOCULAR LENS PLACEMENT (IOC) RIGHT VIVITY LENS 8.59 01:15.3 (Right: Eye)     Patient location during evaluation: PACU Anesthesia Type: MAC Level of consciousness: awake and alert, oriented and patient cooperative Pain management: pain level controlled Vital Signs Assessment: post-procedure vital signs reviewed and stable Respiratory status: spontaneous breathing, nonlabored ventilation and respiratory function stable Cardiovascular status: blood pressure returned to baseline and stable Postop Assessment: adequate PO intake Anesthetic complications: no   No notable events documented.  Darrin Nipper

## 2021-03-04 NOTE — Transfer of Care (Signed)
Immediate Anesthesia Transfer of Care Note  Patient: Larry [name]  Procedure(s) Performed: CATARACT EXTRACTION PHACO AND INTRAOCULAR LENS PLACEMENT (IOC) RIGHT VIVITY LENS 8.59 01:15.3 (Right: Eye)  Patient Location: PACU  Anesthesia Type: MAC  Level of Consciousness: awake, alert  and patient cooperative  Airway and Oxygen Therapy: Patient Spontanous Breathing and Patient connected to supplemental oxygen  Post-op Assessment: Post-op Vital signs reviewed, Patient's Cardiovascular Status Stable, Respiratory Function Stable, Patent Airway and No signs of Nausea or vomiting  Post-op Vital Signs: Reviewed and stable  Complications: No notable events documented.

## 2021-03-04 NOTE — Op Note (Signed)
  LOCATION:  [place]   PREOPERATIVE DIAGNOSIS:    Nuclear sclerotic cataract right eye. H25.11   POSTOPERATIVE DIAGNOSIS:  Nuclear sclerotic cataract right eye.     PROCEDURE:  Phacoemusification with posterior chamber intraocular lens placement of the right eye   ULTRASOUND TIME: Procedure(s): CATARACT EXTRACTION PHACO AND INTRAOCULAR LENS PLACEMENT (IOC) RIGHT VIVITY LENS 8.59 01:15.3 (Right)  LENS:   Implant Name Type Inv. Item Serial No. Manufacturer Lot No. LRB No. Used Action  LENS IOL ACRYSOF VIVITY 24.0 - [id number]  LENS IOL ACRYSOF VIVITY 24.0 [id number] ALCON  Right 1 Implanted         SURGEON:  Wyonia Hough, MD   ANESTHESIA:  Topical with tetracaine drops and 2% Xylocaine jelly, augmented with 1% preservative-free intracameral lidocaine.    COMPLICATIONS:  None.   DESCRIPTION OF PROCEDURE:  The patient was identified in the holding room and transported to the operating room and placed in the supine position under the operating microscope.  The right eye was identified as the operative eye and it was prepped and draped in the usual sterile ophthalmic fashion.   A 1 millimeter clear-corneal paracentesis was made at the 12:00 position.  0.5 ml of preservative-free 1% lidocaine was injected into the anterior chamber. The anterior chamber was filled with Viscoat viscoelastic.  A 2.4 millimeter keratome was used to make a near-clear corneal incision at the 9:00 position.  A curvilinear capsulorrhexis was made with a cystotome and capsulorrhexis forceps.  Balanced salt solution was used to hydrodissect and hydrodelineate the nucleus.   Phacoemulsification was then used in stop and chop fashion to remove the lens nucleus and epinucleus.  The remaining cortex was then removed using the irrigation and aspiration handpiece. Provisc was then placed into the capsular bag to distend it for lens placement.  A lens was then injected into the capsular bag.  The  remaining viscoelastic was aspirated.   Wounds were hydrated with balanced salt solution.  The anterior chamber was inflated to a physiologic pressure with balanced salt solution.  No wound leaks were noted. Vigamox 0.2 ml of a '1mg'$  per ml solution was injected into the anterior chamber for a dose of 0.2 mg of intracameral antibiotic at the completion of the case.   Timolol and Brimonidine drops were applied to the eye.  The patient was taken to the recovery room in stable condition without complications of anesthesia or surgery.   Mervyn Pflaum 03/04/2021, 11:20 AM

## 2021-03-04 NOTE — H&P (Signed)
[facility]   Primary Care Physician:  Larry Ghent, MD Ophthalmologist: Dr. Leandrew [name]  Pre-Procedure History & Physical: HPI:  Larry [name] is a 70 y.o. male here for ophthalmic surgery. Recently recovered from COVID-19.   Past Medical History:  Diagnosis Date   ADD (attention deficit disorder)    Allergy    seasonal flares of perinneal allergies   Cancer ([place])    melanoma   Cardiac arrest ([place]) 07/1997   aborted   Dementia ([place])    post resusitative   Depression    Diverticulosis    Family hx of prostate cancer    GERD (gastroesophageal reflux disease)    Headache(784.0)    Hyperlipidemia    Hypothyroidism    ICD (implantable cardiac defibrillator) in place    Idiopathic urticaria    Internal hemorrhoids    Low back pain syndrome    Melanoma in situ of skin of trunk ([place])    chest   PVC (premature ventricular contraction)    associated with cardiac arrest   Sleep apnea    no c-pap   Tubular adenoma of colon 10/2010    Past Surgical History:  Procedure Laterality Date    implantation x2     CARDIAC DEFIBRILLATOR PLACEMENT  07/01/2010   Explanation of a previously implanted device, pocket revision, and insertion of a new device, and intraoperative defibrillation threshold testing Larry [name])   CARDIAC DEFIBRILLATOR REMOVAL  2004   Defibrillator change Larry [name]) 3 times changed   CATARACT EXTRACTION W/PHACO Left 02/04/2021   Procedure: CATARACT EXTRACTION PHACO AND INTRAOCULAR LENS PLACEMENT ([place]) LEFT VIVITY toric LENS 9.21 01:11.6;  Surgeon: Larry Koyanagi, MD;  Location: [place];  Service: Ophthalmology;  Laterality: Left;  keep patient at 11:30 arrival   [place]      Prior to Admission medications   Medication Sig Start Date End Date Taking? Authorizing Provider  amphetamine-dextroamphetamine (ADDERALL) 10 MG tablet Take 1 tablet (10 mg total) by mouth 2 (two) times daily. 01/16/21  Yes  Larry Ghent, MD  Ascorbic Acid (VITAMIN C) 500 MG tablet Take 500 mg by mouth daily.   Yes [provider]  aspirin 81 MG tablet Take 81 mg by mouth daily.   Yes [provider]  diltiazem (CARDIZEM CD) 120 MG 24 hr capsule TAKE 1 CAPSULE BY MOUTH  DAILY 01/06/21  Yes Sherren Mocha, MD  donepezil (ARICEPT) 10 MG tablet TAKE 1 TABLET BY MOUTH  DAILY 01/06/21  Yes Marcial Pacas, MD  fexofenadine (ALLEGRA) 180 MG tablet Take 180 mg by mouth daily.   Yes [provider]  Ginkgo Biloba 40 MG TABS Take by mouth as directed.   Yes [provider]  lamoTRIgine (LAMICTAL) 100 MG tablet TAKE 1 TABLET BY MOUTH  TWICE DAILY 01/06/21  Yes Marcial Pacas, MD  levothyroxine (SYNTHROID) 137 MCG tablet Take 1 tablet (137 mcg total) by mouth daily before breakfast. 12/10/20  Yes Larry Ghent, MD  Melatonin 5 MG CAPS Take by mouth as directed.   Yes [provider]  memantine (NAMENDA) 10 MG tablet TAKE 1 TABLET BY MOUTH  TWICE DAILY 01/06/21  Yes Marcial Pacas, MD  metoprolol succinate (TOPROL XL) 25 MG 24 hr tablet Take 1 tablet (25 mg total) by mouth daily. 04/24/20  Yes Larry Ghent, MD  montelukast (SINGULAIR) 10 MG tablet TAKE 1 TABLET BY MOUTH  DAILY 09/29/20  Yes Elsie Stain  S, MD  Multiple Vitamin (MULTIVITAMIN) tablet Take 700 tablets by mouth daily.   Yes [provider]  NON FORMULARY Focus factor 1 tab po bid   Yes [provider]  rosuvastatin (CRESTOR) 20 MG tablet Take 1 tablet (20 mg total) by mouth at bedtime. 10/07/20  Yes Larry Ghent, MD    Allergies as of 02/25/2021 - Review Complete 02/25/2021  Allergen Reaction Noted   Penicillins  08/17/2006    Family History  Problem Relation Age of Onset   Hypertension Mother    Heart disease Father    Diverticulosis Father    Coronary artery disease Father    Alopecia Father    Prostate cancer Brother    Lung cancer Paternal Grandfather        Smoker   Cancer Paternal Grandfather         lung cancer - smoker   Diabetes Brother    Heart disease Brother        heart transplant   Colon cancer Neg Hx     Social History   Socioeconomic History   Marital status: Married    Spouse name: Larry [name]   Number of children: 2   Years of education: College   Highest education level: Not on file  Occupational History    Employer: DISABLED  Tobacco Use   Smoking status: Former    Packs/day: 1.00    Years: 10.00    Pack years: 10.00    Types: Cigarettes    Quit date: 07/06/1995    Years since quitting: 25.6   Smokeless tobacco: Former    Types: Chew    Quit date: 11/04/1994  Vaping Use   Vaping Use: Never used  Substance and Sexual Activity   Alcohol use: No    Alcohol/week: 0.0 standard drinks    Comment: socially   Drug use: No   Sexual activity: Not on file  Other Topics Concern   Not on file  Social History Narrative   Patient is married Larry [name]) [phone] with 2 daughters   5 grandchildren   Daily caffeine use - one cup of coffee and one-two cokes per day   Patient is right-handed.   Patient has a college education   Social Determinants of Radio broadcast assistant Strain: Low Risk    Difficulty of Paying Living Expenses: Not hard at all  Food Insecurity: No Food Insecurity   Worried About Charity fundraiser in the Last Year: Never true   Arboriculturist in the Last Year: Never true  Transportation Needs: No Transportation Needs   Lack of Transportation (Medical): No   Lack of Transportation (Non-Medical): No  Physical Activity: Inactive   Days of Exercise per Week: 0 days   Minutes of Exercise per Session: 0 min  Stress: No Stress Concern Present   Feeling of Stress : Not at all  Social Connections: Not on file  Intimate Partner Violence: Not At Risk   Fear of Current or Ex-Partner: No   Emotionally Abused: No   Physically Abused: No   Sexually Abused: No    Review of Systems: See HPI, otherwise negative ROS  Physical Exam: BP (!) 147/94    [name] 78   Temp (!) 97.5 F (36.4 C) (Temporal)   Ht '5\' 9"'$  (1.753 m)   Wt 80.7 kg   SpO2 96%   BMI 26.29 kg/m  General:   Alert,  pleasant and cooperative in NAD Head:  Normocephalic and atraumatic.  Lungs:  Clear to auscultation.    Heart:  Regular rate and rhythm.   Impression/Plan: [name] is here for ophthalmic surgery.  Risks, benefits, limitations, and alternatives regarding ophthalmic surgery have been reviewed with the patient.  Questions have been answered.  All parties agreeable.   Larry Koyanagi, MD  03/04/2021, 10:48 AM

## 2021-03-04 NOTE — Anesthesia Procedure Notes (Signed)
Procedure Name: MAC Date/Time: 03/04/2021 11:04 AM Performed by: Mayme Genta, CRNA Pre-anesthesia Checklist: Patient identified, Emergency Drugs available, Suction available, Timeout performed and Patient being monitored Patient Re-evaluated:Patient Re-evaluated prior to induction Oxygen Delivery Method: Nasal cannula Placement Confirmation: positive ETCO2

## 2021-03-05 ENCOUNTER — Ambulatory Visit: Payer: Medicare Other

## 2021-03-10 ENCOUNTER — Ambulatory Visit: Payer: Medicare Other | Attending: Neurology

## 2021-03-10 ENCOUNTER — Other Ambulatory Visit: Payer: Self-pay

## 2021-03-10 DIAGNOSIS — R262 Difficulty in walking, not elsewhere classified: Secondary | ICD-10-CM | POA: Diagnosis not present

## 2021-03-10 DIAGNOSIS — R296 Repeated falls: Secondary | ICD-10-CM | POA: Insufficient documentation

## 2021-03-10 DIAGNOSIS — M6281 Muscle weakness (generalized): Secondary | ICD-10-CM | POA: Diagnosis not present

## 2021-03-10 DIAGNOSIS — R269 Unspecified abnormalities of gait and mobility: Secondary | ICD-10-CM | POA: Diagnosis not present

## 2021-03-10 DIAGNOSIS — R278 Other lack of coordination: Secondary | ICD-10-CM | POA: Diagnosis not present

## 2021-03-10 DIAGNOSIS — R2681 Unsteadiness on feet: Secondary | ICD-10-CM | POA: Insufficient documentation

## 2021-03-10 NOTE — Therapy (Signed)
[place] MAIN [facility] SERVICES [address] [place], [place], [address] Phone: [phone]   Fax:  [phone]  Physical Therapy Treatment  Patient Details  Name: Larry [name] MRN: [id number] Date of Birth: October 17, 1950 Referring Provider (PT): Dr. Krista Blue   Encounter Date: 03/10/2021   PT End of Session - 03/10/21 1154     Visit Number 8    Number of Visits 25    Date for PT Re-Evaluation 04/21/21    Authorization Time Period 01/27/2021- 04/21/2021    PT Start Time 1148    PT Stop Time 1230    PT Time Calculation (min) 42 min    Equipment Utilized During Treatment Gait belt    Activity Tolerance Patient tolerated treatment well    Behavior During Therapy [facility] for tasks assessed/performed             Past Medical History:  Diagnosis Date   ADD (attention deficit disorder)    Allergy    seasonal flares of perinneal allergies   Cancer ([place])    melanoma   Cardiac arrest ([place]) 07/1997   aborted   Dementia ([place])    post resusitative   Depression    Diverticulosis    Family hx of prostate cancer    GERD (gastroesophageal reflux disease)    Headache(784.0)    Hyperlipidemia    Hypothyroidism    ICD (implantable cardiac defibrillator) in place    Idiopathic urticaria    Internal hemorrhoids    Low back pain syndrome    Melanoma in situ of skin of trunk ([place])    chest   PVC (premature ventricular contraction)    associated with cardiac arrest   Sleep apnea    no c-pap   Tubular adenoma of colon 10/2010    Past Surgical History:  Procedure Laterality Date    implantation x2     CARDIAC DEFIBRILLATOR PLACEMENT  07/01/2010   Explanation of a previously implanted device, pocket revision, and insertion of a new device, and intraoperative defibrillation threshold testing Caryl Comes)   CARDIAC DEFIBRILLATOR REMOVAL  2004   Defibrillator change Caryl Comes) 3 times changed   CATARACT EXTRACTION W/PHACO Left 02/04/2021   Procedure: CATARACT  EXTRACTION PHACO AND INTRAOCULAR LENS PLACEMENT ([place]) LEFT VIVITY toric LENS 9.21 01:11.6;  Surgeon: Leandrew Koyanagi, MD;  Location: [place];  Service: Ophthalmology;  Laterality: Left;  keep patient at 11:30 arrival   CATARACT EXTRACTION W/PHACO Right 03/04/2021   Procedure: CATARACT EXTRACTION PHACO AND INTRAOCULAR LENS PLACEMENT ([place]) RIGHT VIVITY LENS 8.59 01:15.3;  Surgeon: Leandrew Koyanagi, MD;  Location: [place];  Service: Ophthalmology;  Laterality: Right;   HEMORRHOID SURGERY     ICD     TONSILLECTOMY      There were no vitals filed for this visit.   Subjective Assessment - 03/10/21 1151     Subjective Patient reports doing okay this week and no more falls and denies pain.    Patient is accompained by: Family member    Pertinent History Per note submitted by Dr. Krista Blue on 12/15/2020- The patient has an underlying medical history of cardiac death from ventricular fibrillation with hypoxic brain injury in Jan 1999 due to arrythmia, 15 minutes without heart beat,  status post defibrillator placement, He stayed in hospital for 35 days. On 12/16/2020-Gait abnormality              Also related to the previous anoxic brain injury, deconditioning,    Limitations Walking;House hold activities  How long can you sit comfortably? no limitations    How long can you stand comfortably? Not really limited    How long can you walk comfortably? 15-20    Patient Stated Goals To improve transfers, balance and walking.    Currently in Pain? No/denies    Pain Onset More than a month ago             INTERVENTIONS:   Coordination- combined movement- Sit to stand with arms outstretched holding onto 5kg weighted yellow ball- then once in standing- reaching overhead.   Basketball shot - initially standing on 1/2 foam roll  and performing shooting basketball (small nerf balls x 15) - Difficulty maintaining balance but improved with repetitions.   Basketball shot-  tandem  standing on blue airex pad x 15 reps   Squats- standing on blue airex pad x 12 reps.  Without UE support.   Reaching  for cones  while squatting on blue airex pad x 12.   Reaching for cones at various positions then reach across body and including thoracic rotation handing cones to PT.   Standing on firm surface and dynamic reaching forward- Golfers lean to try to touch wall x 12 reps (Difficulty reaching with left UE).   Education provided throughout session via VC/TC and demonstration to facilitate movement at target joints and correct muscle activation for all testing and exercises performed.   Clinical Impression: Patient was again challenged with all static and dynamic balance exercises and difficulty coordinating tasks today. He requires close CGA for balance and multiple cues- VC, TC, and visual demo for proper execution of activities. He was well motivated and limited only by fatigue requiring brief rest break after each activity. Pt will benefit from continued skilled PT services to address deficits in LE strength, mobility, and balance to restore independence with household and community mobility and decrease risk for future falls.                       PT Education - 03/10/21 1151     Education Details Exercise technique    Person(s) Educated Patient    Methods Explanation;Demonstration;Tactile cues;Verbal cues;Handout    Comprehension Verbalized understanding;Returned demonstration;Verbal cues required;Tactile cues required;Need further instruction              PT Short Term Goals - 01/27/21 1011       PT SHORT TERM GOAL #1   Title Pt will be independent with HEP in order to improve strength and balance in order to decrease fall risk and improve function at home and work.    Baseline 01/27/2021- Patient presents with no formal HEP in place    Time 6    Period Weeks    Status New    Target Date 03/10/21               PT Long Term Goals -  02/03/21 0855       PT LONG TERM GOAL #1   Title Pt will improve BERG by at least 5 points in order to demonstrate clinically significant improvement in balance.    Baseline 01/27/2021= 43/56    Time 12    Period Weeks    Status New    Target Date 04/21/21      PT LONG TERM GOAL #2   Title Pt will improve FOTO to target score of 55% to display perceived improvements in ability to complete ADL's.    Baseline 01/27/2021= 42%  Time 12    Period Weeks    Status New    Target Date 04/21/21      PT LONG TERM GOAL #3   Title Pt will decrease 5TSTS by at least 3 seconds in order to demonstrate clinically significant improvement in LE strength.    Baseline 01/27/2021= 30.07 without UE support    Time 12    Period Weeks    Status New    Target Date 04/21/21      PT LONG TERM GOAL #4   Title Pt will decrease TUG to below 15  seconds/decrease in order to demonstrate decreased fall risk.    Baseline 01/27/2021= 20.97 without an AD    Time 12    Period Weeks    Status New    Target Date 04/21/21      PT LONG TERM GOAL #5   Title Pt will increase 10MWT by at least 0.13 m/s in order to demonstrate clinically significant improvement in community ambulation.    Baseline 01/27/2021= 0.67 m/s without an AD    Time 12    Period Weeks    Status New    Target Date 04/21/21      Additional Long Term Goals   Additional Long Term Goals Yes      PT LONG TERM GOAL #6   Title Pt will increase 6MWT by at least 33m(167f in order to demonstrate clinically significant improvement in cardiopulmonary endurance and community ambulation    Baseline 02/03/2021= 1100 feet    Time 11    Period Weeks    Status New    Target Date 04/21/21                   Plan - 03/10/21 1154     Clinical Impression Statement Patient was again challenged with all static and dynamic balance exercises and difficulty coordinating tasks today. He requires close CGA for balance and multiple cues- VC, TC, and  visual demo for proper execution of activities. He was well motivated and limited only by fatigue requiring brief rest break after each activity. Pt will benefit from continued skilled PT services to address deficits in LE strength, mobility, and balance to restore independence with household and community mobility and decrease risk for future falls.    Personal Factors and Comorbidities Comorbidity 3+    Comorbidities Anoxic brain injury (1999), Cardiac (pacemaker/debrillator), HTN    Examination-Activity Limitations Bathing;Caring for Others;Lift;Stairs;Transfers    Examination-Participation Restrictions Community Activity;Yard Work    Stability/Clinical Decision Making Stable/Uncomplicated    Rehab Potential Good    PT Frequency 2x / week    PT Duration 12 weeks    PT Treatment/Interventions ADLs/Self Care Home Management;Cryotherapy;Moist Heat;DME Instruction;Gait training;Stair training;Functional mobility training;Therapeutic activities;Therapeutic exercise;Balance training;Neuromuscular re-education;Patient/family education;Manual techniques;Passive range of motion;Dry needling    PT Next Visit Plan Progress LE strengthening next visit and continue with  balance exercises.    PT Home Exercise Plan No changes this visit    Consulted and Agree with Plan of Care Patient;Family member/caregiver    Family Member Consulted Wife- KaJuliann Pulse           Patient will benefit from skilled therapeutic intervention in order to improve the following deficits and impairments:  Abnormal gait, Decreased activity tolerance, Decreased balance, Decreased cognition, Decreased coordination, Decreased endurance, Decreased mobility, Decreased strength, Difficulty walking  Visit Diagnosis: Abnormality of gait and mobility  Difficulty in walking, not elsewhere classified  Muscle weakness (generalized)  Other  lack of coordination     Problem List Patient Active Problem List   Diagnosis Date Noted    Gait abnormality 12/16/2020   Nonintractable headache 09/30/2020   Tinnitus 09/09/2019   History of sudden cardiac arrest 01/09/2019   Viral syndrome 09/06/2018   Seizures ([place]) 03/02/2018   Syncope 12/26/2017   Other social stressor 12/26/2017   Health care maintenance 08/14/2017   Family hx of prostate cancer    Allergy    Insomnia 10/20/2015   Advance care planning 03/08/2014   Memory loss 09/14/2013   Hypoxic brain injury ([place]) 09/14/2013   Medicare annual wellness visit, subsequent 02/16/2013   Shortness of breath 08/10/2011   Implantable cardioverter-defibrillator (ICD) in situ 02/03/2009   Hypothyroidism 11/18/2008   CONTACT DERMATITIS&OTHER ECZEMA DUE TO PLANTS 02/03/2008   IDIOPATHIC URTICARIA 02/03/2008   Hypertension 02/03/2008   HLD (hyperlipidemia) 11/13/07   Attention deficit disorder Nov 13, 2007   PVC/VT non sustained 11/13/2007   Sleep apnea 13-Nov-2007   HEADACHE Nov 13, 2007   SUDDEN DEATH-aborted Nov 13, 2007   LOW BACK PAIN SYNDROME 10/11/2007    Lewis Moccasin, PT 03/10/2021, 1:17 PM  [place] MAIN [facility] SERVICES [address] [place], [place], [address] Phone: [phone]   Fax:  [phone]  Name: GEOVANNIE MACNAB MRN: [id number] Date of Birth: [birth year]

## 2021-03-12 ENCOUNTER — Ambulatory Visit: Payer: Medicare Other

## 2021-03-16 ENCOUNTER — Ambulatory Visit: Payer: Medicare Other

## 2021-03-16 ENCOUNTER — Other Ambulatory Visit: Payer: Self-pay

## 2021-03-16 DIAGNOSIS — M6281 Muscle weakness (generalized): Secondary | ICD-10-CM

## 2021-03-16 DIAGNOSIS — R262 Difficulty in walking, not elsewhere classified: Secondary | ICD-10-CM | POA: Diagnosis not present

## 2021-03-16 DIAGNOSIS — R269 Unspecified abnormalities of gait and mobility: Secondary | ICD-10-CM

## 2021-03-16 DIAGNOSIS — R2681 Unsteadiness on feet: Secondary | ICD-10-CM | POA: Diagnosis not present

## 2021-03-16 DIAGNOSIS — R278 Other lack of coordination: Secondary | ICD-10-CM

## 2021-03-16 DIAGNOSIS — R296 Repeated falls: Secondary | ICD-10-CM | POA: Diagnosis not present

## 2021-03-16 NOTE — Therapy (Signed)
[place] MAIN [facility] SERVICES [address] [place], [place], [address] Phone: [phone]   Fax:  [phone]  Physical Therapy Treatment  Patient Details  Name: Larry [name] MRN: [id number] Date of Birth: 02-26-1951 Referring Provider (PT): Dr. Krista Blue   Encounter Date: 03/16/2021   PT End of Session - 03/16/21 1354     Visit Number 9    Number of Visits 25    Date for PT Re-Evaluation 04/21/21    Authorization Time Period 01/27/2021- 04/21/2021    PT Start Time 1345    PT Stop Time 1429    PT Time Calculation (min) 44 min    Equipment Utilized During Treatment Gait belt    Activity Tolerance Patient tolerated treatment well    Behavior During Therapy [facility] for tasks assessed/performed             Past Medical History:  Diagnosis Date   ADD (attention deficit disorder)    Allergy    seasonal flares of perinneal allergies   Cancer ([place])    melanoma   Cardiac arrest ([place]) 07/1997   aborted   Dementia ([place])    post resusitative   Depression    Diverticulosis    Family hx of prostate cancer    GERD (gastroesophageal reflux disease)    Headache(784.0)    Hyperlipidemia    Hypothyroidism    ICD (implantable cardiac defibrillator) in place    Idiopathic urticaria    Internal hemorrhoids    Low back pain syndrome    Melanoma in situ of skin of trunk ([place])    chest   PVC (premature ventricular contraction)    associated with cardiac arrest   Sleep apnea    no c-pap   Tubular adenoma of colon 10/2010    Past Surgical History:  Procedure Laterality Date    implantation x2     CARDIAC DEFIBRILLATOR PLACEMENT  07/01/2010   Explanation of a previously implanted device, pocket revision, and insertion of a new device, and intraoperative defibrillation threshold testing Caryl Comes)   CARDIAC DEFIBRILLATOR REMOVAL  2004   Defibrillator change Caryl Comes) 3 times changed   CATARACT EXTRACTION W/PHACO Left 02/04/2021   Procedure: CATARACT  EXTRACTION PHACO AND INTRAOCULAR LENS PLACEMENT ([place]) LEFT VIVITY toric LENS 9.21 01:11.6;  Surgeon: Leandrew Koyanagi, MD;  Location: [place];  Service: Ophthalmology;  Laterality: Left;  keep patient at 11:30 arrival   CATARACT EXTRACTION W/PHACO Right 03/04/2021   Procedure: CATARACT EXTRACTION PHACO AND INTRAOCULAR LENS PLACEMENT ([place]) RIGHT VIVITY LENS 8.59 01:15.3;  Surgeon: Leandrew Koyanagi, MD;  Location: [place];  Service: Ophthalmology;  Laterality: Right;   HEMORRHOID SURGERY     ICD     TONSILLECTOMY      There were no vitals filed for this visit.   Subjective Assessment - 03/16/21 1352     Subjective Patient reports having a good week so far without no pain today but did report an "Icepick stryle headache" yesterday.    Patient is accompained by: Family member    Pertinent History Per note submitted by Dr. Krista Blue on 12/15/2020- The patient has an underlying medical history of cardiac death from ventricular fibrillation with hypoxic brain injury in Jan 1999 due to arrythmia, 15 minutes without heart beat,  status post defibrillator placement, He stayed in hospital for 35 days. On 12/16/2020-Gait abnormality              Also related to the previous anoxic brain injury, deconditioning,  Limitations Walking;House hold activities    How long can you sit comfortably? no limitations    How long can you stand comfortably? Not really limited    How long can you walk comfortably? 15-20    Patient Stated Goals To improve transfers, balance and walking.    Currently in Pain? No/denies    Pain Onset More than a month ago             INTERVENTIONS:   Therapeutic Exercises:   Nustep L2 for 5 min LE only- Cues for SPM and coordination of exercises.   Sit to stand without UE support  x 10 reps. BLE- VC for leaning forward.   Lunges- modified - with UE support x 10 reps BLE  Standing ham curls- 5lb. AW- x 10 reps BLE  Sidestepping: 5lb AW x 10 reps  BLE  Forward gait/retro gait with 5lb AW x length of //bars x 5 trials- Mild difficulty with retro step - improved with practice.   Calf raises: from edge of step- 12 reps BLE.  Education provided throughout session via VC/TC and demonstration to facilitate movement at target joints and correct muscle activation for all testing and exercises performed.   Clinical Impression: Patient able to perform standing LE strengthening well with short rest break and VC/TC for correct technique today. He reports fatigue at end of session yet able to complete all requested tasks. Pt will benefit from continued skilled PT services to address deficits in LE strength, mobility, and balance to restore independence with household and community mobility and decrease risk for future falls                           PT Education - 03/16/21 1354     Education Details exercise technique    Person(s) Educated Patient    Methods Explanation;Demonstration;Tactile cues;Verbal cues    Comprehension Verbalized understanding;Returned demonstration;Verbal cues required;Tactile cues required;Need further instruction              PT Short Term Goals - 01/27/21 1011       PT SHORT TERM GOAL #1   Title Pt will be independent with HEP in order to improve strength and balance in order to decrease fall risk and improve function at home and work.    Baseline 01/27/2021- Patient presents with no formal HEP in place    Time 6    Period Weeks    Status New    Target Date 03/10/21               PT Long Term Goals - 02/03/21 0855       PT LONG TERM GOAL #1   Title Pt will improve BERG by at least 5 points in order to demonstrate clinically significant improvement in balance.    Baseline 01/27/2021= 43/56    Time 12    Period Weeks    Status New    Target Date 04/21/21      PT LONG TERM GOAL #2   Title Pt will improve FOTO to target score of 55% to display perceived improvements in ability  to complete ADL's.    Baseline 01/27/2021= 42%    Time 12    Period Weeks    Status New    Target Date 04/21/21      PT LONG TERM GOAL #3   Title Pt will decrease 5TSTS by at least 3 seconds in order to demonstrate clinically significant improvement  in LE strength.    Baseline 01/27/2021= 30.07 without UE support    Time 12    Period Weeks    Status New    Target Date 04/21/21      PT LONG TERM GOAL #4   Title Pt will decrease TUG to below 15  seconds/decrease in order to demonstrate decreased fall risk.    Baseline 01/27/2021= 20.97 without an AD    Time 12    Period Weeks    Status New    Target Date 04/21/21      PT LONG TERM GOAL #5   Title Pt will increase 10MWT by at least 0.13 m/s in order to demonstrate clinically significant improvement in community ambulation.    Baseline 01/27/2021= 0.67 m/s without an AD    Time 12    Period Weeks    Status New    Target Date 04/21/21      Additional Long Term Goals   Additional Long Term Goals Yes      PT LONG TERM GOAL #6   Title Pt will increase 6MWT by at least 26m(1642f in order to demonstrate clinically significant improvement in cardiopulmonary endurance and community ambulation    Baseline 02/03/2021= 1100 feet    Time 11    Period Weeks    Status New    Target Date 04/21/21                   Plan - 03/16/21 1354     Clinical Impression Statement Patient able to perform standing LE strengthening well with short rest break and VC/TC for correct technique today. He reports fatigue at end of session yet able to complete all requested tasks. Pt will benefit from continued skilled PT services to address deficits in LE strength, mobility, and balance to restore independence with household and community mobility and decrease risk for future falls    Personal Factors and Comorbidities Comorbidity 3+    Comorbidities Anoxic brain injury (1999), Cardiac (pacemaker/debrillator), HTN    Examination-Activity Limitations  Bathing;Caring for Others;Lift;Stairs;Transfers    Examination-Participation Restrictions Community Activity;Yard Work    Stability/Clinical Decision Making Stable/Uncomplicated    Rehab Potential Good    PT Frequency 2x / week    PT Duration 12 weeks    PT Treatment/Interventions ADLs/Self Care Home Management;Cryotherapy;Moist Heat;DME Instruction;Gait training;Stair training;Functional mobility training;Therapeutic activities;Therapeutic exercise;Balance training;Neuromuscular re-education;Patient/family education;Manual techniques;Passive range of motion;Dry needling    PT Next Visit Plan Progress LE strengthening next visit and continue with  balance exercises.    PT Home Exercise Plan No changes this visit    Consulted and Agree with Plan of Care Patient;Family member/caregiver    Family Member Consulted Wife- KaJuliann Pulse           Patient will benefit from skilled therapeutic intervention in order to improve the following deficits and impairments:  Abnormal gait, Decreased activity tolerance, Decreased balance, Decreased cognition, Decreased coordination, Decreased endurance, Decreased mobility, Decreased strength, Difficulty walking  Visit Diagnosis: Abnormality of gait and mobility  Difficulty in walking, not elsewhere classified  Muscle weakness (generalized)  Other lack of coordination     Problem List Patient Active Problem List   Diagnosis Date Noted   Gait abnormality 12/16/2020   Nonintractable headache 09/30/2020   Tinnitus 09/09/2019   History of sudden cardiac arrest 01/09/2019   Viral syndrome 09/06/2018   [place]   Syncope 12/26/2017   Other social stressor 12/26/2017   Health care maintenance 08/14/2017  Family hx of prostate cancer    Allergy    Insomnia 10/20/2015   Advance care planning 03/08/2014   Memory loss 09/14/2013   Hypoxic brain injury ([place]) 09/14/2013   Medicare annual wellness visit, subsequent 02/16/2013   Shortness  of breath 08/10/2011   Implantable cardioverter-defibrillator (ICD) in situ 02/03/2009   Hypothyroidism 11/18/2008   CONTACT DERMATITIS&OTHER ECZEMA DUE TO PLANTS 02/03/2008   IDIOPATHIC URTICARIA 02/03/2008   Hypertension 02/03/2008   HLD (hyperlipidemia) 11/15/2007   Attention deficit disorder 11-15-07   PVC/VT non sustained 15-Nov-2007   Sleep apnea 2007-11-15   HEADACHE 11/15/2007   SUDDEN DEATH-aborted 11/15/2007   LOW BACK PAIN SYNDROME 10/11/2007    Lewis Moccasin, PT 03/16/2021, 9:34 PM  [place] MAIN [facility] SERVICES [address] [place], [place], [address] Phone: [phone]   Fax:  [phone]  Name: CASMERE MAINER MRN: [id number] Date of Birth: 1951/04/25

## 2021-03-18 ENCOUNTER — Ambulatory Visit: Payer: Medicare Other

## 2021-03-18 ENCOUNTER — Other Ambulatory Visit: Payer: Self-pay

## 2021-03-18 DIAGNOSIS — R269 Unspecified abnormalities of gait and mobility: Secondary | ICD-10-CM

## 2021-03-18 DIAGNOSIS — R278 Other lack of coordination: Secondary | ICD-10-CM

## 2021-03-18 DIAGNOSIS — R2681 Unsteadiness on feet: Secondary | ICD-10-CM | POA: Diagnosis not present

## 2021-03-18 DIAGNOSIS — M6281 Muscle weakness (generalized): Secondary | ICD-10-CM | POA: Diagnosis not present

## 2021-03-18 DIAGNOSIS — R296 Repeated falls: Secondary | ICD-10-CM | POA: Diagnosis not present

## 2021-03-18 DIAGNOSIS — R262 Difficulty in walking, not elsewhere classified: Secondary | ICD-10-CM | POA: Diagnosis not present

## 2021-03-19 ENCOUNTER — Ambulatory Visit: Payer: Medicare Other

## 2021-03-19 NOTE — Therapy (Signed)
[place] MAIN [facility] SERVICES [address] [place], [place], [address] Phone: [phone]   Fax:  [phone]  Physical Therapy Treatment/Physical Therapy Progress Note   Dates of reporting period 01/27/2021  to   03/18/2021   Patient Details  Name: Larry [name] MRN: [id number] Date of Birth: [birth year] Referring Provider (PT): Dr. Krista Blue   Encounter Date: 03/18/2021   PT End of Session - 03/18/21 1303     Visit Number 10    Number of Visits 25    Date for PT Re-Evaluation 04/21/21    Authorization Time Period 01/27/2021- 04/21/2021    PT Start Time 1300    PT Stop Time 1344    PT Time Calculation (min) 44 min    Equipment Utilized During Treatment Gait belt    Activity Tolerance Patient tolerated treatment well    Behavior During Therapy [facility] for tasks assessed/performed             Past Medical History:  Diagnosis Date   ADD (attention deficit disorder)    Allergy    seasonal flares of perinneal allergies   Cancer ([place])    melanoma   Cardiac arrest ([place]) 07/1997   aborted   Dementia ([place])    post resusitative   Depression    Diverticulosis    Family hx of prostate cancer    GERD (gastroesophageal reflux disease)    Headache(784.0)    Hyperlipidemia    Hypothyroidism    ICD (implantable cardiac defibrillator) in place    Idiopathic urticaria    Internal hemorrhoids    Low back pain syndrome    Melanoma in situ of skin of trunk ([place])    chest   PVC (premature ventricular contraction)    associated with cardiac arrest   Sleep apnea    no c-pap   Tubular adenoma of colon 10/2010    Past Surgical History:  Procedure Laterality Date    implantation x2     CARDIAC DEFIBRILLATOR PLACEMENT  07/01/2010   Explanation of a previously implanted device, pocket revision, and insertion of a new device, and intraoperative defibrillation threshold testing Caryl Comes)   CARDIAC DEFIBRILLATOR REMOVAL  2004   Defibrillator change  Caryl Comes) 3 times changed   CATARACT EXTRACTION W/PHACO Left 02/04/2021   Procedure: CATARACT EXTRACTION PHACO AND INTRAOCULAR LENS PLACEMENT ([place]) LEFT VIVITY toric LENS 9.21 01:11.6;  Surgeon: Leandrew Koyanagi, MD;  Location: [place];  Service: Ophthalmology;  Laterality: Left;  keep patient at 11:30 arrival   CATARACT EXTRACTION W/PHACO Right 03/04/2021   Procedure: CATARACT EXTRACTION PHACO AND INTRAOCULAR LENS PLACEMENT ([place]) RIGHT VIVITY LENS 8.59 01:15.3;  Surgeon: Leandrew Koyanagi, MD;  Location: [place];  Service: Ophthalmology;  Laterality: Right;   HEMORRHOID SURGERY     ICD     TONSILLECTOMY      There were no vitals filed for this visit.   Subjective Assessment - 03/18/21 1302     Subjective Patient reports Doing well overall and states no issues from last visit.    Patient is accompained by: Family member    Pertinent History Per note submitted by Dr. Krista Blue on 12/15/2020- The patient has an underlying medical history of cardiac death from ventricular fibrillation with hypoxic brain injury in Jan 1999 due to arrythmia, 15 minutes without heart beat,  status post defibrillator placement, He stayed in hospital for 35 days. On 12/16/2020-Gait abnormality              Also  related to the previous anoxic brain injury, deconditioning,    Limitations Walking;House hold activities    How long can you sit comfortably? no limitations    How long can you stand comfortably? Not really limited    How long can you walk comfortably? 15-20    Patient Stated Goals To improve transfers, balance and walking.    Currently in Pain? No/denies    Pain Onset More than a month ago                [facility] PT Assessment - 03/18/21 1321       Berg Balance Test   Sit to Stand Able to stand without using hands and stabilize independently    Standing Unsupported Able to stand safely 2 minutes    Sitting with Back Unsupported but Feet Supported on Floor or Stool Able to sit  safely and securely 2 minutes    Stand to Sit Sits safely with minimal use of hands    Transfers Able to transfer safely, minor use of hands    Standing Unsupported with Eyes Closed Able to stand 10 seconds safely    Standing Unsupported with Feet Together Able to place feet together independently and stand for 1 minute with supervision    From Standing, Reach Forward with Outstretched Arm Can reach confidently >25 cm (10")    From Standing Position, Pick up Object from Floor Able to pick up shoe, needs supervision    From Standing Position, Turn to Look Behind Over each Shoulder Looks behind from both sides and weight shifts well    Turn 360 Degrees Able to turn 360 degrees safely but slowly    Standing Unsupported, Alternately Place Feet on Step/Stool Able to stand independently and safely and complete 8 steps in 20 seconds    Standing Unsupported, One Foot in Front Able to place foot tandem independently and hold 30 seconds    Standing on One Leg Able to lift leg independently and hold equal to or more than 3 seconds    Total Score 50              INTERVENTIONS:   Assessed STG and LTGS today:   BERG- Improved from 43/56 to 50/56 5x STS- 24.78 sec from 30.07 sec TUG- 19.2 sec improved from 20.97 sec 10 MWT= 0.75 m/s improved from 0.67 (initial eval)  6 min walk test= 1125 feet improved from 1100 feet (initial eval)  Clinical Impression: Patient presents with progression with both short-term and long-term goals.  Patient has met his short-term goal of independence with a home program and demonstrating improved dynamic standing balance as seen by improved BERG score.  Patient also demonstrated decreased time with timed up and go test as well as improved gait velocity measured during the 10 m walk test.  He also slightly improved with his functional endurance by 25 feet versus previous set testing. Patient's condition has the potential to improve in response to therapy. Maximum  improvement is yet to be obtained. The anticipated improvement is attainable and reasonable in a generally predictable time.  Pt will benefit from continued skilled PT services to address deficits in LE strength, mobility, and balance to restore independence with household and community mobility and decrease risk for future falls                         PT Education - 03/18/21 1302     Education Details exercise technique  Person(s) Educated Patient    Methods Explanation;Demonstration;Tactile cues;Verbal cues    Comprehension Verbalized understanding;Returned demonstration;Verbal cues required;Tactile cues required;Need further instruction              PT Short Term Goals - 03/18/21 1306       PT SHORT TERM GOAL #1   Title Pt will be independent with HEP in order to improve strength and balance in order to decrease fall risk and improve function at home and work.    Baseline 01/27/2021- Patient presents with no formal HEP in place. 03/18/2021- Patient is knowledgeable of HEP- WIfe reports he is not always compliant but not because I don't know what to do."    Time 6    Period Weeks    Status Achieved    Target Date 03/10/21               PT Long Term Goals - 03/18/21 1310       PT LONG TERM GOAL #1   Title Pt will improve BERG by at least 5 points in order to demonstrate clinically significant improvement in balance.    Baseline 01/27/2021= 43/56; 03/18/2021=50/56    Time 12    Period Weeks    Status Achieved      PT LONG TERM GOAL #2   Title Pt will improve FOTO to target score of 55% to display perceived improvements in ability to complete ADL's.    Baseline 01/27/2021= 42%    Time 12    Period Weeks    Status New      PT LONG TERM GOAL #3   Title Pt will decrease 5TSTS by at least 3 seconds in order to demonstrate clinically significant improvement in LE strength.    Baseline 01/27/2021= 30.07 without UE support; 03/18/2021= 24.78 sec without UE  Support    Time 12    Period Weeks    Status New    Target Date 04/21/21      PT LONG TERM GOAL #4   Title Pt will decrease TUG to below 15  seconds/decrease in order to demonstrate decreased fall risk.    Baseline 01/27/2021= 20.97 without an AD; 03/18/2021= 19.2 sec without AD    Time 12    Period Weeks    Status On-going    Target Date 04/21/21      PT LONG TERM GOAL #5   Title Pt will increase 10MWT by at least 0.13 m/s in order to demonstrate clinically significant improvement in community ambulation.    Baseline 01/27/2021= 0.67 m/s without an AD.  9/14= 0.75 m/s    Time 12    Period Weeks    Status New      PT LONG TERM GOAL #6   Title Pt will increase 6MWT by at least 67m(167f in order to demonstrate clinically significant improvement in cardiopulmonary endurance and community ambulation    Baseline 02/03/2021= 1100 feet; 03/18/21= 1125 feet without AD.    Time 11    Period Weeks    Status New                   Plan - 03/18/21 1305     Clinical Impression Statement Patient presents with progression with both short-term and long-term goals.  Patient has met his short-term goal of independence with a home program and demonstrating improved dynamic standing balance as seen by improved BERG score.  Patient also demonstrated decreased time with timed up and go test as well  as improved gait velocity measured during the 10 m walk test.  He also slightly improved with his functional endurance by 25 feet versus previous set testing.  Patient's condition has the potential to improve in response to therapy. Maximum improvement is yet to be obtained. The anticipated improvement is attainable and reasonable in a generally predictable time.  Pt will benefit from continued skilled PT services to address deficits in LE strength, mobility, and balance to restore independence with household and community mobility and decrease risk for future falls    Personal Factors and Comorbidities  Comorbidity 3+    Comorbidities Anoxic brain injury (1999), Cardiac (pacemaker/debrillator), HTN    Examination-Activity Limitations Bathing;Caring for Others;Lift;Stairs;Transfers    Examination-Participation Restrictions Community Activity;Yard Work    Stability/Clinical Decision Making Stable/Uncomplicated    Rehab Potential Good    PT Frequency 2x / week    PT Duration 12 weeks    PT Treatment/Interventions ADLs/Self Care Home Management;Cryotherapy;Moist Heat;DME Instruction;Gait training;Stair training;Functional mobility training;Therapeutic activities;Therapeutic exercise;Balance training;Neuromuscular re-education;Patient/family education;Manual techniques;Passive range of motion;Dry needling    PT Next Visit Plan Progress LE strengthening next visit and continue with  balance exercises.    PT Home Exercise Plan No changes this visit    Consulted and Agree with Plan of Care Patient;Family member/caregiver    Family Member Consulted Wife- [name]             Patient will benefit from skilled therapeutic intervention in order to improve the following deficits and impairments:  Abnormal gait, Decreased activity tolerance, Decreased balance, Decreased cognition, Decreased coordination, Decreased endurance, Decreased mobility, Decreased strength, Difficulty walking  Visit Diagnosis: Abnormality of gait and mobility  Difficulty in walking, not elsewhere classified  Muscle weakness (generalized)  Other lack of coordination     Problem List Patient Active Problem List   Diagnosis Date Noted   Gait abnormality 12/16/2020   Nonintractable headache 09/30/2020   Tinnitus 09/09/2019   History of sudden cardiac arrest 01/09/2019   Viral syndrome 09/06/2018   Seizures ([place]) 03/02/2018   Syncope 12/26/2017   Other social stressor 12/26/2017   Health care maintenance 08/14/2017   Family hx of prostate cancer    Allergy    Insomnia 10/20/2015   Advance care planning 03/08/2014    Memory loss 09/14/2013   Hypoxic brain injury ([place]) 09/14/2013   Medicare annual wellness visit, subsequent 02/16/2013   Shortness of breath 08/10/2011   Implantable cardioverter-defibrillator (ICD) in situ 02/03/2009   Hypothyroidism 11/18/2008   CONTACT DERMATITIS&OTHER ECZEMA DUE TO PLANTS 02/03/2008   IDIOPATHIC URTICARIA 02/03/2008   Hypertension 02/03/2008   HLD (hyperlipidemia) 11-24-07   Attention deficit disorder Nov 24, 2007   PVC/VT non sustained 24-Nov-2007   Sleep apnea 11/24/07   HEADACHE Nov 24, 2007   SUDDEN DEATH-aborted 2007-11-24   LOW BACK PAIN SYNDROME 10/11/2007    Lewis Moccasin, PT 03/19/2021, 12:58 PM  [place] MAIN [facility] SERVICES [address] [place], [place], [address] Phone: [phone]   Fax:  [phone]  Name: Larry [name] MRN: [id number] Date of Birth: May 12, 1951

## 2021-03-23 ENCOUNTER — Ambulatory Visit: Payer: Medicare Other

## 2021-03-25 ENCOUNTER — Other Ambulatory Visit: Payer: Self-pay

## 2021-03-25 ENCOUNTER — Ambulatory Visit: Payer: Medicare Other

## 2021-03-25 DIAGNOSIS — R296 Repeated falls: Secondary | ICD-10-CM | POA: Diagnosis not present

## 2021-03-25 DIAGNOSIS — R269 Unspecified abnormalities of gait and mobility: Secondary | ICD-10-CM

## 2021-03-25 DIAGNOSIS — R2681 Unsteadiness on feet: Secondary | ICD-10-CM

## 2021-03-25 DIAGNOSIS — R278 Other lack of coordination: Secondary | ICD-10-CM

## 2021-03-25 DIAGNOSIS — R262 Difficulty in walking, not elsewhere classified: Secondary | ICD-10-CM

## 2021-03-25 DIAGNOSIS — M6281 Muscle weakness (generalized): Secondary | ICD-10-CM

## 2021-03-25 NOTE — Therapy (Signed)
Larry [name] MAIN [facility] SERVICES [address] [place], [place], [address] Phone: [phone]   Fax:  [phone]  Physical Therapy Treatment  Patient Details  Name: Larry [name] MRN: [id number] Date of Birth: [birth year] Referring Provider (PT): Dr. Krista Blue   Encounter Date: 03/25/2021   PT End of Session - 03/25/21 1323     Visit Number 11    Number of Visits 25    Date for PT Re-Evaluation 04/21/21    Authorization Type [employer] Medicare    Authorization Time Period 01/27/2021- 04/21/2021    PT Start Time 1305    PT Stop Time 1345    PT Time Calculation (min) 40 min    Equipment Utilized During Treatment Gait belt    Activity Tolerance Patient tolerated treatment well;No increased pain    Behavior During Therapy WFL for tasks assessed/performed             Past Medical History:  Diagnosis Date   ADD (attention deficit disorder)    Allergy    seasonal flares of perinneal allergies   Cancer ([place])    melanoma   Cardiac arrest ([place]) 07/1997   aborted   Dementia ([place])    post resusitative   Depression    Diverticulosis    Family hx of prostate cancer    GERD (gastroesophageal reflux disease)    Headache(784.0)    Hyperlipidemia    Hypothyroidism    ICD (implantable cardiac defibrillator) in place    Idiopathic urticaria    Internal hemorrhoids    Low back pain syndrome    Melanoma in situ of skin of trunk ([place])    chest   PVC (premature ventricular contraction)    associated with cardiac arrest   Sleep apnea    no c-pap   Tubular adenoma of colon 10/2010    Past Surgical History:  Procedure Laterality Date    implantation x2     CARDIAC DEFIBRILLATOR PLACEMENT  07/01/2010   Explanation of a previously implanted device, pocket revision, and insertion of a new device, and intraoperative defibrillation threshold testing Caryl Comes)   CARDIAC DEFIBRILLATOR REMOVAL  2004   Defibrillator change Caryl Comes) 3 times changed   CATARACT  EXTRACTION W/PHACO Left 02/04/2021   Procedure: CATARACT EXTRACTION PHACO AND INTRAOCULAR LENS PLACEMENT ([place]) LEFT VIVITY toric LENS 9.21 01:11.6;  Surgeon: Leandrew Koyanagi, MD;  Location: [place];  Service: Ophthalmology;  Laterality: Left;  keep patient at 11:30 arrival   CATARACT EXTRACTION W/PHACO Right 03/04/2021   Procedure: CATARACT EXTRACTION PHACO AND INTRAOCULAR LENS PLACEMENT ([place]) RIGHT VIVITY LENS 8.59 01:15.3;  Surgeon: Leandrew Koyanagi, MD;  Location: [place];  Service: Ophthalmology;  Laterality: Right;   HEMORRHOID SURGERY     ICD     TONSILLECTOMY      There were no vitals filed for this visit.   Subjective Assessment - 03/25/21 1319     Subjective Pt reports doing well, pleased with progress last time at reassessment. Pt reports no medical or medication updates today. No pain today. No falls/close calls since past visit.    Patient is accompained by: Family member   Larry [name]   Pertinent History Per note submitted by Dr. Krista Blue on 12/15/2020- The patient has an underlying medical history of cardiac death from ventricular fibrillation with hypoxic brain injury in Jan 1999 due to arrythmia, 15 minutes without heart beat,  status post defibrillator placement, He stayed in hospital for 35 days. On 12/16/2020-Gait abnormality, Also related to  the previous anoxic brain injury, deconditioning,    Currently in Pain? No/denies             INTERVENTION THIS DATE:  -Overground AMB warmup, gait training: 5 minutes, 914ft, no device, no aberrant sway, no LOB, no signs of exertion; 0.75m/s   -STS x10 from standard height  -seated reciprocal brace marching 1x20, hands free  -Seated LAQ 1x10 5lb    Standing ham curls- 5lb. AW- 1x10 reps BLE  Calf raises: from edge of step- 12 reps BLE Sidestepping: 5lb AW x 8 reps BLE (wide base skiers)    Sagittal plane ROM orientation activity fest: -Blue 3000g ball deadlift from stool to overhead press 1x12 -Fwd step  up, step down 180 degree turn 16x, alternating turn directions -Foam stance, 90 degree trunk rotation + overhead clown ball rebound 1x8 bilat       PT Education - 03/25/21 1322     Education Details technique for turnk movements when exercising    Person(s) Educated Patient    Methods Explanation    Comprehension Verbalized understanding;Returned demonstration              PT Short Term Goals - 03/18/21 1306       PT SHORT TERM GOAL #1   Title Pt will be independent with HEP in order to improve strength and balance in order to decrease fall risk and improve function at home and work.    Baseline 01/27/2021- Patient presents with no formal HEP in place. 03/18/2021- Patient is knowledgeable of HEP- WIfe reports he is not always compliant but not because I don't know what to do."    Time 6    Period Weeks    Status Achieved    Target Date 03/10/21               PT Long Term Goals - 03/18/21 1310       PT LONG TERM GOAL #1   Title Pt will improve BERG by at least 5 points in order to demonstrate clinically significant improvement in balance.    Baseline 01/27/2021= 43/56; 03/18/2021=50/56    Time 12    Period Weeks    Status Achieved      PT LONG TERM GOAL #2   Title Pt will improve FOTO to target score of 55% to display perceived improvements in ability to complete ADL's.    Baseline 01/27/2021= 42%    Time 12    Period Weeks    Status New      PT LONG TERM GOAL #3   Title Pt will decrease 5TSTS by at least 3 seconds in order to demonstrate clinically significant improvement in LE strength.    Baseline 01/27/2021= 30.07 without UE support; 03/18/2021= 24.78 sec without UE Support    Time 12    Period Weeks    Status New    Target Date 04/21/21      PT LONG TERM GOAL #4   Title Pt will decrease TUG to below 15  seconds/decrease in order to demonstrate decreased fall risk.    Baseline 01/27/2021= 20.97 without an AD; 03/18/2021= 19.2 sec without AD    Time 12     Period Weeks    Status On-going    Target Date 04/21/21      PT LONG TERM GOAL #5   Title Pt will increase 10MWT by at least 0.13 m/s in order to demonstrate clinically significant improvement in community ambulation.    Baseline 01/27/2021= 0.67  m/s without an AD.  9/14= 0.75 m/s    Time 12    Period Weeks    Status New      PT LONG TERM GOAL #6   Title Pt will increase 6MWT by at least 44m (178ft) in order to demonstrate clinically significant improvement in cardiopulmonary endurance and community ambulation    Baseline 02/03/2021= 1100 feet; 03/18/21= 1125 feet without AD.    Time 11    Period Weeks    Status New                   Plan - 03/25/21 1324     Clinical Impression Statement Continued with current plan of care as laid out in evaluation and recent prior sessions. Pt remains motivated to advance progress toward goals. Rest breaks provided as needed, pt quick to ask when needed. Wife helps with counting  Pt does require varying levels of assistance and cuing for completion of exercises for correct form, wife helps with counting of reps. Pt continues to demonstrate progress toward goals AEB progression of some interventions this date either in volume or intensity.    Personal Factors and Comorbidities Comorbidity 3+    Comorbidities Anoxic brain injury (1999), Cardiac (pacemaker/debrillator), HTN    Examination-Activity Limitations Bathing;Caring for Others;Lift;Stairs;Transfers    Examination-Participation Restrictions Community Activity;Yard Work    Stability/Clinical Decision Making Stable/Uncomplicated    Clinical Decision Making Moderate    Rehab Potential Good    PT Frequency 2x / week    PT Duration 12 weeks    PT Treatment/Interventions ADLs/Self Care Home Management;Cryotherapy;Moist Heat;DME Instruction;Gait training;Stair training;Functional mobility training;Therapeutic activities;Therapeutic exercise;Balance training;Neuromuscular re-education;Patient/family  education;Manual techniques;Passive range of motion;Dry needling    PT Next Visit Plan Progress LE strengthening next visit and continue with  balance exercises.    PT Home Exercise Plan No changes this visit    Consulted and Agree with Plan of Care Patient;Family member/caregiver    Family Member Consulted Wife- Larry [name]             Patient will benefit from skilled therapeutic intervention in order to improve the following deficits and impairments:  Abnormal gait, Decreased activity tolerance, Decreased balance, Decreased cognition, Decreased coordination, Decreased endurance, Decreased mobility, Decreased strength, Difficulty walking  Visit Diagnosis: Abnormality of gait and mobility  Difficulty in walking, not elsewhere classified  Muscle weakness (generalized)  Other lack of coordination  Unsteadiness on feet  Repeated falls     Problem List Patient Active Problem List   Diagnosis Date Noted   Gait abnormality 12/16/2020   Nonintractable headache 09/30/2020   Tinnitus 09/09/2019   History of sudden cardiac arrest 01/09/2019   Viral syndrome 09/06/2018   Seizures ([place]) 03/02/2018   Syncope 12/26/2017   Other social stressor 12/26/2017   Health care maintenance 08/14/2017   Family hx of prostate cancer    Allergy    Insomnia 10/20/2015   Advance care planning 03/08/2014   Memory loss 09/14/2013   Hypoxic brain injury ([place]) 09/14/2013   Medicare annual wellness visit, subsequent 02/16/2013   Shortness of breath 08/10/2011   Implantable cardioverter-defibrillator (ICD) in situ 02/03/2009   Hypothyroidism 11/18/2008   CONTACT DERMATITIS&OTHER ECZEMA DUE TO PLANTS 02/03/2008   IDIOPATHIC URTICARIA 02/03/2008   Hypertension 02/03/2008   HLD (hyperlipidemia) 2007-11-26   Attention deficit disorder Nov 26, 2007   PVC/VT non sustained Nov 26, 2007   Sleep apnea 26-Nov-2007   HEADACHE 26-Nov-2007   SUDDEN DEATH-aborted 2007/11/26   LOW BACK PAIN SYNDROME 10/11/2007  2:02 PM, 03/25/21 Etta Grandchild, PT, DPT Physical Therapist - [facility]  Outpatient Physical Therapy- [place] [phone]     [place], [place] 03/25/2021, 1:27 PM  [place] MAIN [facility] SERVICES [address] [place], [place], [address] Phone: [phone]   Fax:  [phone]  Name: Larry [name] MRN: [id number] Date of Birth: [birth year]

## 2021-03-26 ENCOUNTER — Ambulatory Visit: Payer: Medicare Other

## 2021-03-30 ENCOUNTER — Ambulatory Visit: Payer: Medicare Other

## 2021-03-31 ENCOUNTER — Ambulatory Visit (INDEPENDENT_AMBULATORY_CARE_PROVIDER_SITE_OTHER): Payer: Medicare Other | Admitting: Internal Medicine

## 2021-03-31 ENCOUNTER — Encounter: Payer: Self-pay | Admitting: Internal Medicine

## 2021-03-31 ENCOUNTER — Other Ambulatory Visit: Payer: Self-pay

## 2021-03-31 VITALS — BP 140/98 | HR 97 | Ht 69.0 in | Wt 182.0 lb

## 2021-03-31 DIAGNOSIS — I4729 Other ventricular tachycardia: Secondary | ICD-10-CM

## 2021-03-31 DIAGNOSIS — I10 Essential (primary) hypertension: Secondary | ICD-10-CM

## 2021-03-31 DIAGNOSIS — I472 Ventricular tachycardia: Secondary | ICD-10-CM

## 2021-03-31 DIAGNOSIS — Z95810 Presence of automatic (implantable) cardiac defibrillator: Secondary | ICD-10-CM | POA: Diagnosis not present

## 2021-03-31 DIAGNOSIS — Z8674 Personal history of sudden cardiac arrest: Secondary | ICD-10-CM | POA: Diagnosis not present

## 2021-03-31 LAB — PACEMAKER DEVICE OBSERVATION

## 2021-03-31 NOTE — Progress Notes (Signed)
Patient Care Team: [name], MD as PCP - General (Family Medicine) Deboraha Sprang, MD as PCP - Electrophysiology (Cardiology) Sherren Mocha, MD as PCP - Cardiology (Cardiology) Leandrew Koyanagi, MD as Referring Physician (Ophthalmology)   HPI  Larry [name] is a 70 y.o. male seen in followup for aborted cardiac arrest 1999 that occurred in the context of PVCs for which he was being treated with Propafenone.  ICD generator replacement  Dec 2011 .   The patient denies chest pain, shortness of breath, nocturnal dyspnea, orthopnea or peripheral edema.  There have been no palpitations, lightheadedness or syncope.  Complains of issues with balance and recurrent falls, typically tripping over his sluggish right foot.  He has been seen by neurology and is undergoing therapy.     Date Cr K TSH Hgb  12/20 1.0 4.0 6.29 14.6  4/22 1.08 4.1 5.94 (6/22) 14.9     Past Medical History:  Diagnosis Date   ADD (attention deficit disorder)    Allergy    seasonal flares of perinneal allergies   Cancer ([place])    melanoma   Cardiac arrest ([place]) 07/1997   aborted   Dementia ([place])    post resusitative   Depression    Diverticulosis    Family hx of prostate cancer    GERD (gastroesophageal reflux disease)    Headache(784.0)    Hyperlipidemia    Hypothyroidism    ICD (implantable cardiac defibrillator) in place    Idiopathic urticaria    Internal hemorrhoids    Low back pain syndrome    Melanoma in situ of skin of trunk ([place])    chest   PVC (premature ventricular contraction)    associated with cardiac arrest   Sleep apnea    no c-pap   Tubular adenoma of colon 10/2010    Past Surgical History:  Procedure Laterality Date    implantation x2     CARDIAC DEFIBRILLATOR PLACEMENT  07/01/2010   Explanation of a previously implanted device, pocket revision, and insertion of a new device, and intraoperative defibrillation threshold testing Caryl Comes)   CARDIAC DEFIBRILLATOR REMOVAL   2004   Defibrillator change Caryl Comes) 3 times changed   CATARACT EXTRACTION W/PHACO Left 02/04/2021   Procedure: CATARACT EXTRACTION PHACO AND INTRAOCULAR LENS PLACEMENT ([place]) LEFT VIVITY toric LENS 9.21 01:11.6;  Surgeon: Leandrew Koyanagi, MD;  Location: [place];  Service: Ophthalmology;  Laterality: Left;  keep patient at 11:30 arrival   CATARACT EXTRACTION W/PHACO Right 03/04/2021   Procedure: CATARACT EXTRACTION PHACO AND INTRAOCULAR LENS PLACEMENT ([place]) RIGHT VIVITY LENS 8.59 01:15.3;  Surgeon: Leandrew Koyanagi, MD;  Location: [place];  Service: Ophthalmology;  Laterality: Right;   HEMORRHOID SURGERY     ICD     TONSILLECTOMY      Current Outpatient Medications  Medication Sig Dispense Refill   amphetamine-dextroamphetamine (ADDERALL) 10 MG tablet Take 1 tablet (10 mg total) by mouth 2 (two) times daily. 180 tablet 0   Ascorbic Acid (VITAMIN C) 500 MG tablet Take 500 mg by mouth daily.     aspirin 81 MG tablet Take 81 mg by mouth daily.     diltiazem (CARDIZEM CD) 120 MG 24 hr capsule TAKE 1 CAPSULE BY MOUTH  DAILY 90 capsule 1   donepezil (ARICEPT) 10 MG tablet TAKE 1 TABLET BY MOUTH  DAILY 90 tablet 3   fexofenadine (ALLEGRA) 180 MG tablet Take 180 mg by mouth daily.     Ginkgo Biloba 40 MG TABS Take by  mouth as directed.     lamoTRIgine (LAMICTAL) 100 MG tablet TAKE 1 TABLET BY MOUTH  TWICE DAILY 180 tablet 3   levothyroxine (SYNTHROID) 137 MCG tablet Take 1 tablet (137 mcg total) by mouth daily before breakfast. 90 tablet 3   Melatonin 5 MG CAPS Take by mouth as directed.     memantine (NAMENDA) 10 MG tablet TAKE 1 TABLET BY MOUTH  TWICE DAILY 180 tablet 3   metoprolol succinate (TOPROL XL) 25 MG 24 hr tablet Take 1 tablet (25 mg total) by mouth daily. 90 tablet 3   montelukast (SINGULAIR) 10 MG tablet TAKE 1 TABLET BY MOUTH  DAILY 90 tablet 3   Multiple Vitamin (MULTIVITAMIN) tablet Take 700 tablets by mouth daily.     NON FORMULARY Focus factor 1 tab  po bid     rosuvastatin (CRESTOR) 20 MG tablet Take 1 tablet (20 mg total) by mouth at bedtime. 90 tablet 3   No current facility-administered medications for this visit.    Allergies  Allergen Reactions   Penicillins     Difficult Breathing, rash    Review of Systems negative except from HPI and PMH  Physical Exam BP (!) 140/98 (BP Location: Left Arm, Patient Position: Sitting, Cuff Size: Normal)   Pulse 97   Ht 5\' 9"  (1.753 m)   Wt 182 lb (82.6 kg)   SpO2 97%   BMI 26.88 kg/m  Well developed and well nourished in no acute distress HENT normal Neck supple with JVP-flat Clear Device pocket well healed; without hematoma or erythema.  There is no tethering  Regular rate and rhythm, no  murmur Abd-soft with active BS No Clubbing cyanosis  edema Skin-warm and dry A & Oriented  Grossly normal sensory and motor function  ECG sinus at 97 15/10/36   Assessment and  Plan Aborted Cardiac Arrest  Hypertension     PVCs  Implantable defibrillator-[employer]  For PVCs and his blood pressure we will continue home metoprolol 25 and his Cardizem 120.  No interval ventricular arrhythmias

## 2021-03-31 NOTE — Patient Instructions (Signed)

## 2021-04-01 ENCOUNTER — Ambulatory Visit: Payer: Medicare Other

## 2021-04-01 DIAGNOSIS — R296 Repeated falls: Secondary | ICD-10-CM | POA: Diagnosis not present

## 2021-04-01 DIAGNOSIS — R262 Difficulty in walking, not elsewhere classified: Secondary | ICD-10-CM | POA: Diagnosis not present

## 2021-04-01 DIAGNOSIS — R278 Other lack of coordination: Secondary | ICD-10-CM | POA: Diagnosis not present

## 2021-04-01 DIAGNOSIS — M6281 Muscle weakness (generalized): Secondary | ICD-10-CM | POA: Diagnosis not present

## 2021-04-01 DIAGNOSIS — R2681 Unsteadiness on feet: Secondary | ICD-10-CM | POA: Diagnosis not present

## 2021-04-01 DIAGNOSIS — R269 Unspecified abnormalities of gait and mobility: Secondary | ICD-10-CM

## 2021-04-01 NOTE — Therapy (Signed)
[place] [facility] SERVICES [address] [place], [place], [address] Phone: [phone]   Fax:  [phone]  Physical Therapy Treatment  Patient Details  Name: Larry [name] MRN: [id number] Date of Birth: 1950/11/11 Referring Provider (PT): Dr. Krista Blue   Encounter Date: 04/01/2021   PT End of Session - 04/01/21 1306     Visit Number 12    Number of Visits 25    Date for PT Re-Evaluation 04/21/21    Authorization Type UHC Medicare    Authorization Time Period 01/27/2021- 04/21/2021    PT Start Time 1300    PT Stop Time 1343    PT Time Calculation (min) 43 min    Equipment Utilized During Treatment Gait belt    Activity Tolerance Patient tolerated treatment well;No increased pain    Behavior During Therapy WFL for tasks assessed/performed             Past Medical History:  Diagnosis Date   ADD (attention deficit disorder)    Allergy    seasonal flares of perinneal allergies   Cancer ([place])    melanoma   Cardiac arrest ([place]) 07/1997   aborted   Dementia ([place])    post resusitative   Depression    Diverticulosis    Family hx of prostate cancer    GERD (gastroesophageal reflux disease)    Headache(784.0)    Hyperlipidemia    Hypothyroidism    ICD (implantable cardiac defibrillator) in place    Idiopathic urticaria    Internal hemorrhoids    Low back pain syndrome    Melanoma in situ of skin of trunk ([place])    chest   PVC (premature ventricular contraction)    associated with cardiac arrest   Sleep apnea    no c-pap   Tubular adenoma of colon 10/2010    Past Surgical History:  Procedure Laterality Date    implantation x2     CARDIAC DEFIBRILLATOR PLACEMENT  07/01/2010   Explanation of a previously implanted device, pocket revision, and insertion of a new device, and intraoperative defibrillation threshold testing Caryl Comes)   CARDIAC DEFIBRILLATOR REMOVAL  2004   Defibrillator change Caryl Comes) 3 times changed   CATARACT  EXTRACTION W/PHACO Left 02/04/2021   Procedure: CATARACT EXTRACTION PHACO AND INTRAOCULAR LENS PLACEMENT ([place]) LEFT VIVITY toric LENS 9.21 01:11.6;  Surgeon: Leandrew Koyanagi, MD;  Location: [place];  Service: Ophthalmology;  Laterality: Left;  keep patient at 11:30 arrival   CATARACT EXTRACTION W/PHACO Right 03/04/2021   Procedure: CATARACT EXTRACTION PHACO AND INTRAOCULAR LENS PLACEMENT ([place]) RIGHT VIVITY LENS 8.59 01:15.3;  Surgeon: Leandrew Koyanagi, MD;  Location: [place];  Service: Ophthalmology;  Laterality: Right;   HEMORRHOID SURGERY     ICD     TONSILLECTOMY      There were no vitals filed for this visit.   Subjective Assessment - 04/01/21 1304     Subjective Patient reports doing well overall - Denies any new issues. No falls and no reported pain    Patient is accompained by: Family member   [name]   Pertinent History Per note submitted by Dr. Krista Blue on 12/15/2020- The patient has an underlying medical history of cardiac death from ventricular fibrillation with hypoxic brain injury in Jan 1999 due to arrythmia, 15 minutes without heart beat,  status post defibrillator placement, He stayed in hospital for 35 days. On 12/16/2020-Gait abnormality, Also related to the previous anoxic brain injury, deconditioning,    Currently in Pain?  No/denies            INTERVENTIONS:   Dynamic activities in // bars:  * first exercise moving in // bars and 2nd on the way back to start position:   1) High forward marches Retro gait  2) Frankensteins (UE extended with SLR)  Retro reciprocal gait (VC for longer steps)  3) But kicks (VC and visual demo for correct technique)  Retro gait- VC to take a longer side backward for more reciprocal pattern).   4) Toe walking (heels up) Toe walking (heels up)   5) heel walking Heel walking  6) Walk forward Pivot while walking backward right/Left  7) Shuffle to cones- Forward (6 cones positioned staggered yet parallel)   Backward shuffle   Sit to stand from 24 in. edge of mat without UE support x 4 reps Sit to stand from 22 in. X 4 reps Sit to sand from 21 in. X 4 reps  Education provided throughout session via VC/TC and demonstration to facilitate movement at target joints and correct muscle activation for all testing and exercises performed.     Clinical Impression: Patient challenged with coordination activities exhibiting difficulty sequencing tasks requiring increased verbal cues and physical assist. He did experience some imbalance throughout session requiring UE support. He remained motivated and unphased by his difficulty and continued to practice and did improve some with increased reps and practice. Pt will benefit from continued skilled PT services to address deficits in LE strength, mobility, and balance to restore independence with household and community mobility and decrease risk for future falls                      PT Education - 04/01/21 1305     Education Details Exercise technique.    Person(s) Educated Patient    Methods Explanation;Demonstration;Tactile cues;Verbal cues    Comprehension Verbalized understanding;Returned demonstration;Verbal cues required;Need further instruction;Tactile cues required              PT Short Term Goals - 03/18/21 1306       PT SHORT TERM GOAL #1   Title Pt will be independent with HEP in order to improve strength and balance in order to decrease fall risk and improve function at home and work.    Baseline 01/27/2021- Patient presents with no formal HEP in place. 03/18/2021- Patient is knowledgeable of HEP- WIfe reports he is not always compliant but not because I don't know what to do."    Time 6    Period Weeks    Status Achieved    Target Date 03/10/21               PT Long Term Goals - 03/18/21 1310       PT LONG TERM GOAL #1   Title Pt will improve BERG by at least 5 points in order to demonstrate clinically  significant improvement in balance.    Baseline 01/27/2021= 43/56; 03/18/2021=50/56    Time 12    Period Weeks    Status Achieved      PT LONG TERM GOAL #2   Title Pt will improve FOTO to target score of 55% to display perceived improvements in ability to complete ADL's.    Baseline 01/27/2021= 42%    Time 12    Period Weeks    Status New      PT LONG TERM GOAL #3   Title Pt will decrease 5TSTS by at least 3 seconds in order  to demonstrate clinically significant improvement in LE strength.    Baseline 01/27/2021= 30.07 without UE support; 03/18/2021= 24.78 sec without UE Support    Time 12    Period Weeks    Status New    Target Date 04/21/21      PT LONG TERM GOAL #4   Title Pt will decrease TUG to below 15  seconds/decrease in order to demonstrate decreased fall risk.    Baseline 01/27/2021= 20.97 without an AD; 03/18/2021= 19.2 sec without AD    Time 12    Period Weeks    Status On-going    Target Date 04/21/21      PT LONG TERM GOAL #5   Title Pt will increase 10MWT by at least 0.13 m/s in order to demonstrate clinically significant improvement in community ambulation.    Baseline 01/27/2021= 0.67 m/s without an AD.  9/14= 0.75 m/s    Time 12    Period Weeks    Status New      PT LONG TERM GOAL #6   Title Pt will increase 6MWT by at least 58m (138ft) in order to demonstrate clinically significant improvement in cardiopulmonary endurance and community ambulation    Baseline 02/03/2021= 1100 feet; 03/18/21= 1125 feet without AD.    Time 11    Period Weeks    Status New                   Plan - 04/01/21 1306     Clinical Impression Statement Patient challenged with coordination activities exhibiting difficulty sequencing tasks requiring increased verbal cues and physical assist. He did experience some imbalance throughout session requiring UE support. He remained motivated and unphased by his difficulty and continued to practice and did improve some with increased reps  and practice. Pt will benefit from continued skilled PT services to address deficits in LE strength, mobility, and balance to restore independence with household and community mobility and decrease risk for future falls    Personal Factors and Comorbidities Comorbidity 3+    Comorbidities Anoxic brain injury (1999), Cardiac (pacemaker/debrillator), HTN    Examination-Activity Limitations Bathing;Caring for Others;Lift;Stairs;Transfers    Examination-Participation Restrictions Community Activity;Yard Work    Stability/Clinical Decision Making Stable/Uncomplicated    Rehab Potential Good    PT Frequency 2x / week    PT Duration 12 weeks    PT Treatment/Interventions ADLs/Self Care Home Management;Cryotherapy;Moist Heat;DME Instruction;Gait training;Stair training;Functional mobility training;Therapeutic activities;Therapeutic exercise;Balance training;Neuromuscular re-education;Patient/family education;Manual techniques;Passive range of motion;Dry needling    PT Next Visit Plan Progress LE strengthening next visit and continue with  balance exercises.    PT Home Exercise Plan No changes this visit    Consulted and Agree with Plan of Care Patient;Family member/caregiver    Family Member Consulted Wife- [name]             Patient will benefit from skilled therapeutic intervention in order to improve the following deficits and impairments:  Abnormal gait, Decreased activity tolerance, Decreased balance, Decreased cognition, Decreased coordination, Decreased endurance, Decreased mobility, Decreased strength, Difficulty walking  Visit Diagnosis: Abnormality of gait and mobility  Difficulty in walking, not elsewhere classified  Muscle weakness (generalized)  Unsteadiness on feet     Problem List Patient Active Problem List   Diagnosis Date Noted   Gait abnormality 12/16/2020   Nonintractable headache 09/30/2020   Tinnitus 09/09/2019   History of sudden cardiac arrest 01/09/2019    Viral syndrome 09/06/2018   Seizures ([place]) 03/02/2018   Syncope 12/26/2017  Other social stressor 12/26/2017   Health care maintenance 08/14/2017   Family hx of prostate cancer    Allergy    Insomnia 10/20/2015   Advance care planning 03/08/2014   Memory loss 09/14/2013   Hypoxic brain injury ([place]) 09/14/2013   Medicare annual wellness visit, subsequent 02/16/2013   Shortness of breath 08/10/2011   Implantable cardioverter-defibrillator (ICD) in situ 02/03/2009   Hypothyroidism 11/18/2008   CONTACT DERMATITIS&OTHER ECZEMA DUE TO PLANTS 02/03/2008   IDIOPATHIC URTICARIA 02/03/2008   Hypertension 02/03/2008   HLD (hyperlipidemia) 11-10-07   Attention deficit disorder Nov 10, 2007   PVC/VT non sustained 11-10-2007   Sleep apnea Nov 10, 2007   HEADACHE 10-Nov-2007   SUDDEN DEATH-aborted 2007/11/10   LOW BACK PAIN SYNDROME 10/11/2007    Lewis Moccasin, PT 04/02/2021, 3:34 PM  [place] MAIN [facility] SERVICES [address] [place], [place], [address] Phone: [phone]   Fax:  [phone]  Name: Larry [name] MRN: [id number] Date of Birth: [birth year]

## 2021-04-02 ENCOUNTER — Ambulatory Visit: Payer: Medicare Other

## 2021-04-06 ENCOUNTER — Other Ambulatory Visit: Payer: Self-pay

## 2021-04-06 ENCOUNTER — Ambulatory Visit: Payer: Medicare Other | Attending: Neurology | Admitting: Physical Therapy

## 2021-04-06 DIAGNOSIS — R278 Other lack of coordination: Secondary | ICD-10-CM | POA: Diagnosis not present

## 2021-04-06 DIAGNOSIS — R262 Difficulty in walking, not elsewhere classified: Secondary | ICD-10-CM

## 2021-04-06 DIAGNOSIS — R2681 Unsteadiness on feet: Secondary | ICD-10-CM | POA: Diagnosis not present

## 2021-04-06 DIAGNOSIS — M6281 Muscle weakness (generalized): Secondary | ICD-10-CM

## 2021-04-06 DIAGNOSIS — R269 Unspecified abnormalities of gait and mobility: Secondary | ICD-10-CM | POA: Diagnosis not present

## 2021-04-06 DIAGNOSIS — R296 Repeated falls: Secondary | ICD-10-CM | POA: Diagnosis not present

## 2021-04-06 NOTE — Therapy (Signed)
[place] MAIN [facility] SERVICES [address] [place], [place], [address] Phone: [phone]   Fax:  [phone]  Physical Therapy Treatment  Patient Details  Name: Larry [name] MRN: [id number] Date of Birth: 1951-06-24 Referring Provider (PT): Dr. Krista Blue   Encounter Date: 04/06/2021   PT End of Session - 04/06/21 1421     Visit Number 13    Number of Visits 25    Date for PT Re-Evaluation 04/21/21    Authorization Type UHC Medicare    Authorization Time Period 01/27/2021- 04/21/2021    PT Start Time 1353    PT Stop Time 1430    PT Time Calculation (min) 37 min    Equipment Utilized During Treatment Gait belt    Activity Tolerance Patient tolerated treatment well;No increased pain    Behavior During Therapy WFL for tasks assessed/performed             Past Medical History:  Diagnosis Date   ADD (attention deficit disorder)    Allergy    seasonal flares of perinneal allergies   Cancer ([place])    melanoma   Cardiac arrest ([place]) 07/1997   aborted   Dementia ([place])    post resusitative   Depression    Diverticulosis    Family hx of prostate cancer    GERD (gastroesophageal reflux disease)    Headache(784.0)    Hyperlipidemia    Hypothyroidism    ICD (implantable cardiac defibrillator) in place    Idiopathic urticaria    Internal hemorrhoids    Low back pain syndrome    Melanoma in situ of skin of trunk ([place])    chest   PVC (premature ventricular contraction)    associated with cardiac arrest   Sleep apnea    no c-pap   Tubular adenoma of colon 10/2010    Past Surgical History:  Procedure Laterality Date    implantation x2     CARDIAC DEFIBRILLATOR PLACEMENT  07/01/2010   Explanation of a previously implanted device, pocket revision, and insertion of a new device, and intraoperative defibrillation threshold testing Caryl Comes)   CARDIAC DEFIBRILLATOR REMOVAL  2004   Defibrillator change Caryl Comes) 3 times changed   CATARACT  EXTRACTION W/PHACO Left 02/04/2021   Procedure: CATARACT EXTRACTION PHACO AND INTRAOCULAR LENS PLACEMENT ([place]) LEFT VIVITY toric LENS 9.21 01:11.6;  Surgeon: Leandrew Koyanagi, MD;  Location: [place];  Service: Ophthalmology;  Laterality: Left;  keep patient at 11:30 arrival   CATARACT EXTRACTION W/PHACO Right 03/04/2021   Procedure: CATARACT EXTRACTION PHACO AND INTRAOCULAR LENS PLACEMENT ([place]) RIGHT VIVITY LENS 8.59 01:15.3;  Surgeon: Leandrew Koyanagi, MD;  Location: [place];  Service: Ophthalmology;  Laterality: Right;   HEMORRHOID SURGERY     ICD     TONSILLECTOMY      There were no vitals filed for this visit.   Subjective Assessment - 04/06/21 1355     Subjective Pt reports no new falls and no significant changes since last session.    Patient is accompained by: Family member    Pertinent History Per note submitted by Dr. Krista Blue on 12/15/2020- The patient has an underlying medical history of cardiac death from ventricular fibrillation with hypoxic brain injury in Jan 1999 due to arrythmia, 15 minutes without heart beat,  status post defibrillator placement, He stayed in hospital for 35 days. On 12/16/2020-Gait abnormality, Also related to the previous anoxic brain injury, deconditioning,    Limitations Walking;House hold activities    How long  can you sit comfortably? no limitations    How long can you stand comfortably? Not really limited    How long can you walk comfortably? 15-20    Patient Stated Goals To improve transfers, balance and walking.    Currently in Pain? Yes    Pain Score 8     Pain Location Neck    Pain Orientation Right;Posterior    Pain Descriptors / Indicators Aching;Sore    Pain Type Neuropathic pain               Treatment provided this session  Therex:  5# Ankle weight donned Seated  LAQ, 2 x 10  -cues for good control, instructed to hold for 3 second at top   Standing  HS curls 2 x 10  -UE support    HR 2 x 15  -UE  support -pt required verbal cues for proper repetitions of exercise    STS 2 x 10, no UE support, utilized hands on knees, decreased difficulty with second set compared to first set.     Neuro Re- Ed:   Dynamic walking activities in parallel bars in order to improve dynamic stability and balance responses   Marching , retro walking 2 laps   Frankenstein's, retro walking x 2 laps  -tendency to look down and UE tend to progress toward extension compared to flexion  Heel walking x 2 laps  -unable to complete with full range of motion   Toe walking x 2 laps  -unable to complete with full available rom due to imbalance and weakness  Sidestepping x 2 laps  -cues for maintaining straight line  Significant difficulty with all activities without utilizing UE for balance support, cues for keeping head straight to prevent looking down and to alleviate neck pain  -ataxia noted with all of the above activities     Pt required occasional rest breaks due fatigue, PT was quick to ask when pt appeared to be fatiguing in order to prevent excessive fatigue. Unless otherwise stated, SBA was provided and gait belt donned in order to ensure pt safety Pt educated throughout session about proper posture and technique with exercises. Improved exercise technique, movement at target joints, use of target muscles after min to mod verbal, visual, tactile cues.                          PT Education - 04/06/21 1408     Education Details Exercise technique    Person(s) Educated Patient    Methods Explanation;Demonstration;Tactile cues;Verbal cues    Comprehension Verbalized understanding;Returned demonstration;Verbal cues required;Tactile cues required;Need further instruction              PT Short Term Goals - 03/18/21 1306       PT SHORT TERM GOAL #1   Title Pt will be independent with HEP in order to improve strength and balance in order to decrease fall risk and improve  function at home and work.    Baseline 01/27/2021- Patient presents with no formal HEP in place. 03/18/2021- Patient is knowledgeable of HEP- WIfe reports he is not always compliant but not because I don't know what to do."    Time 6    Period Weeks    Status Achieved    Target Date 03/10/21               PT Long Term Goals - 03/18/21 1310       PT LONG  TERM GOAL #1   Title Pt will improve BERG by at least 5 points in order to demonstrate clinically significant improvement in balance.    Baseline 01/27/2021= 43/56; 03/18/2021=50/56    Time 12    Period Weeks    Status Achieved      PT LONG TERM GOAL #2   Title Pt will improve FOTO to target score of 55% to display perceived improvements in ability to complete ADL's.    Baseline 01/27/2021= 42%    Time 12    Period Weeks    Status New      PT LONG TERM GOAL #3   Title Pt will decrease 5TSTS by at least 3 seconds in order to demonstrate clinically significant improvement in LE strength.    Baseline 01/27/2021= 30.07 without UE support; 03/18/2021= 24.78 sec without UE Support    Time 12    Period Weeks    Status New    Target Date 04/21/21      PT LONG TERM GOAL #4   Title Pt will decrease TUG to below 15  seconds/decrease in order to demonstrate decreased fall risk.    Baseline 01/27/2021= 20.97 without an AD; 03/18/2021= 19.2 sec without AD    Time 12    Period Weeks    Status On-going    Target Date 04/21/21      PT LONG TERM GOAL #5   Title Pt will increase 10MWT by at least 0.13 m/s in order to demonstrate clinically significant improvement in community ambulation.    Baseline 01/27/2021= 0.67 m/s without an AD.  9/14= 0.75 m/s    Time 12    Period Weeks    Status New      PT LONG TERM GOAL #6   Title Pt will increase 6MWT by at least 78m (149ft) in order to demonstrate clinically significant improvement in cardiopulmonary endurance and community ambulation    Baseline 02/03/2021= 1100 feet; 03/18/21= 1125 feet without  AD.    Time 11    Period Weeks    Status New                   Plan - 04/06/21 1422     Clinical Impression Statement Pt challenged with balance and continued coordincation activities including marching, sidestepping, heel and toe walking. Pt continues to deomnstrated difficulty wiht sequencing tasks but had no incidents of LOB despite significant imbalance noted with standing activities without UE support. Pt continues to be motivated to improve but does requires frequent cues for proper performance, repetitions and duration for exercises performed in threapy session. Pt will continue to bnefit from skilled physical therapy intervention in order to improve his LE strength, endurance, balance, and to reduce his risk and frequency of falls.    Personal Factors and Comorbidities Comorbidity 3+    Comorbidities Anoxic brain injury (1999), Cardiac (pacemaker/debrillator), HTN    Examination-Activity Limitations Bathing;Caring for Others;Lift;Stairs;Transfers    Examination-Participation Restrictions Community Activity;Yard Work    Stability/Clinical Decision Making Stable/Uncomplicated    Rehab Potential Good    PT Frequency 2x / week    PT Duration 12 weeks    PT Treatment/Interventions ADLs/Self Care Home Management;Cryotherapy;Moist Heat;DME Instruction;Gait training;Stair training;Functional mobility training;Therapeutic activities;Therapeutic exercise;Balance training;Neuromuscular re-education;Patient/family education;Manual techniques;Passive range of motion;Dry needling    PT Next Visit Plan Progress LE strengthening next visit and continue with  balance exercises.    PT Home Exercise Plan No changes this visit    Consulted and Agree with Plan  of Care Patient;Family member/caregiver    Family Member Consulted Wife- [name]             Patient will benefit from skilled therapeutic intervention in order to improve the following deficits and impairments:  Abnormal gait,  Decreased activity tolerance, Decreased balance, Decreased cognition, Decreased coordination, Decreased endurance, Decreased mobility, Decreased strength, Difficulty walking  Visit Diagnosis: Abnormality of gait and mobility  Difficulty in walking, not elsewhere classified  Muscle weakness (generalized)  Unsteadiness on feet  Repeated falls     Problem List Patient Active Problem List   Diagnosis Date Noted   Gait abnormality 12/16/2020   Nonintractable headache 09/30/2020   Tinnitus 09/09/2019   History of sudden cardiac arrest 01/09/2019   Viral syndrome 09/06/2018   Seizures ([place]) 03/02/2018   Syncope 12/26/2017   Other social stressor 12/26/2017   Health care maintenance 08/14/2017   Family hx of prostate cancer    Allergy    Insomnia 10/20/2015   Advance care planning 03/08/2014   Memory loss 09/14/2013   Hypoxic brain injury ([place]) 09/14/2013   Medicare annual wellness visit, subsequent 02/16/2013   Shortness of breath 08/10/2011   Implantable cardioverter-defibrillator (ICD) in situ 02/03/2009   Hypothyroidism 11/18/2008   CONTACT DERMATITIS&OTHER ECZEMA DUE TO PLANTS 02/03/2008   IDIOPATHIC URTICARIA 02/03/2008   Hypertension 02/03/2008   HLD (hyperlipidemia) Nov 07, 2007   Attention deficit disorder 2007-11-07   PVC/VT non sustained 11/07/07   Sleep apnea 11-07-07   HEADACHE 11/07/07   SUDDEN DEATH-aborted Nov 07, 2007   LOW BACK PAIN SYNDROME 10/11/2007    Particia Lather, PT 04/06/2021, 3:00 PM  [place] [address] [place], [place], [address] Phone: [phone]   Fax:  [phone]  Name: CARLIE CORPUS MRN: [id number] Date of Birth: 1951/02/27

## 2021-04-08 ENCOUNTER — Ambulatory Visit: Payer: Medicare Other

## 2021-04-09 ENCOUNTER — Ambulatory Visit: Payer: Medicare Other

## 2021-04-10 ENCOUNTER — Ambulatory Visit (INDEPENDENT_AMBULATORY_CARE_PROVIDER_SITE_OTHER): Payer: Medicare Other

## 2021-04-10 DIAGNOSIS — Z8674 Personal history of sudden cardiac arrest: Secondary | ICD-10-CM

## 2021-04-11 ENCOUNTER — Other Ambulatory Visit: Payer: Self-pay | Admitting: Family Medicine

## 2021-04-13 ENCOUNTER — Other Ambulatory Visit: Payer: Self-pay

## 2021-04-13 ENCOUNTER — Ambulatory Visit: Payer: Medicare Other

## 2021-04-13 DIAGNOSIS — R262 Difficulty in walking, not elsewhere classified: Secondary | ICD-10-CM

## 2021-04-13 DIAGNOSIS — R2681 Unsteadiness on feet: Secondary | ICD-10-CM | POA: Diagnosis not present

## 2021-04-13 DIAGNOSIS — R278 Other lack of coordination: Secondary | ICD-10-CM | POA: Diagnosis not present

## 2021-04-13 DIAGNOSIS — M6281 Muscle weakness (generalized): Secondary | ICD-10-CM

## 2021-04-13 DIAGNOSIS — R269 Unspecified abnormalities of gait and mobility: Secondary | ICD-10-CM

## 2021-04-13 DIAGNOSIS — R296 Repeated falls: Secondary | ICD-10-CM | POA: Diagnosis not present

## 2021-04-13 NOTE — Therapy (Signed)
[place] MAIN [facility] SERVICES [address] [place], [place], [address] Phone: [phone]   Fax:  [phone]  Physical Therapy Treatment  Patient Details  Name: Larry [name] MRN: [id number] Date of Birth: 11-05-1950 Referring Provider (PT): Dr. Krista Blue   Encounter Date: 04/13/2021   PT End of Session - 04/13/21 1355     Visit Number 14    Number of Visits 25    Date for PT Re-Evaluation 04/21/21    Authorization Type UHC Medicare    Authorization Time Period 01/27/2021- 04/21/2021    PT Start Time 0762    PT Stop Time 1429    PT Time Calculation (min) 40 min    Equipment Utilized During Treatment Gait belt    Activity Tolerance Patient tolerated treatment well;No increased pain    Behavior During Therapy WFL for tasks assessed/performed             Past Medical History:  Diagnosis Date   ADD (attention deficit disorder)    Allergy    seasonal flares of perinneal allergies   Cancer ([place])    melanoma   Cardiac arrest ([place]) 07/1997   aborted   Dementia ([place])    post resusitative   Depression    Diverticulosis    Family hx of prostate cancer    GERD (gastroesophageal reflux disease)    Headache(784.0)    Hyperlipidemia    Hypothyroidism    ICD (implantable cardiac defibrillator) in place    Idiopathic urticaria    Internal hemorrhoids    Low back pain syndrome    Melanoma in situ of skin of trunk ([place])    chest   PVC (premature ventricular contraction)    associated with cardiac arrest   Sleep apnea    no c-pap   Tubular adenoma of colon 10/2010    Past Surgical History:  Procedure Laterality Date    implantation x2     CARDIAC DEFIBRILLATOR PLACEMENT  07/01/2010   Explanation of a previously implanted device, pocket revision, and insertion of a new device, and intraoperative defibrillation threshold testing Caryl Comes)   CARDIAC DEFIBRILLATOR REMOVAL  2004   Defibrillator change Caryl Comes) 3 times changed   CATARACT  EXTRACTION W/PHACO Left 02/04/2021   Procedure: CATARACT EXTRACTION PHACO AND INTRAOCULAR LENS PLACEMENT ([place]) LEFT VIVITY toric LENS 9.21 01:11.6;  Surgeon: Leandrew Koyanagi, MD;  Location: [place];  Service: Ophthalmology;  Laterality: Left;  keep patient at 11:30 arrival   CATARACT EXTRACTION W/PHACO Right 03/04/2021   Procedure: CATARACT EXTRACTION PHACO AND INTRAOCULAR LENS PLACEMENT ([place]) RIGHT VIVITY LENS 8.59 01:15.3;  Surgeon: Leandrew Koyanagi, MD;  Location: [place];  Service: Ophthalmology;  Laterality: Right;   HEMORRHOID SURGERY     ICD     TONSILLECTOMY      There were no vitals filed for this visit.   Subjective Assessment - 04/13/21 1353     Subjective Patient reports doing really well over the weekend and no new issues.    Patient is accompained by: Family member    Pertinent History Per note submitted by Dr. Krista Blue on 12/15/2020- The patient has an underlying medical history of cardiac death from ventricular fibrillation with hypoxic brain injury in Jan 1999 due to arrythmia, 15 minutes without heart beat,  status post defibrillator placement, He stayed in hospital for 35 days. On 12/16/2020-Gait abnormality, Also related to the previous anoxic brain injury, deconditioning,    Limitations Walking;House hold activities    How long  can you sit comfortably? no limitations    How long can you stand comfortably? Not really limited    How long can you walk comfortably? 15-20    Patient Stated Goals To improve transfers, balance and walking.    Currently in Pain? No/denies             INTERVENTIONS:  Therapeutic exercises:  Nustep L2 BUE/LE at seat level 9 (UE at 9) x 6 min- Continuous monitoring O2 sat and HR. O2 sat maintained at 97% or higher and HR up to 98 bpm.   Dynamic marching with high knees at support bar without UE support.    Neuromuscular re-ed:  Cones- Set up 6 cones and patient performed coordination activities - including figure  8 around cones with cues to look ahead.  Side step/forward step in/around cones x 5 trials- Increased difficulty with coordination yet no loss of balance.   Forward/backward/Side steps in/around cones x 5 trials- Mild difficulty with coordination and required increased VC to coordinate around cones.   Scavenger hunt: 4 trials attempting to walk through gym and locate strategically placed cones and patient able to scan left and right and vertical with CGA yet no loss of balance. He did exhibit difficulty coordinating movement- decreased gait speed and intermittent VC to scan.   Clinical Impression: Patient challenged with all cone activities exhibiting some difficulty with coordinating movements. He continues to benefit from cueing and did not have any significant loss of balance. He did have difficulty scanning room for cones and required several trips and Cues to locate today. He remains well motivated and was able to scan room without dizziness or loss of balance. Pt will continue to bnefit from skilled physical therapy intervention in order to improve his LE strength, endurance, balance, and to reduce his risk and frequency of falls.                             PT Education - 04/13/21 1354     Education Details Exercise technique    Person(s) Educated Patient    Methods Explanation;Demonstration;Tactile cues;Verbal cues    Comprehension Verbalized understanding;Returned demonstration;Verbal cues required;Need further instruction              PT Short Term Goals - 03/18/21 1306       PT SHORT TERM GOAL #1   Title Pt will be independent with HEP in order to improve strength and balance in order to decrease fall risk and improve function at home and work.    Baseline 01/27/2021- Patient presents with no formal HEP in place. 03/18/2021- Patient is knowledgeable of HEP- WIfe reports he is not always compliant but not because I don't know what to do."    Time 6     Period Weeks    Status Achieved    Target Date 03/10/21               PT Long Term Goals - 03/18/21 1310       PT LONG TERM GOAL #1   Title Pt will improve BERG by at least 5 points in order to demonstrate clinically significant improvement in balance.    Baseline 01/27/2021= 43/56; 03/18/2021=50/56    Time 12    Period Weeks    Status Achieved      PT LONG TERM GOAL #2   Title Pt will improve FOTO to target score of 55% to display perceived improvements in ability  to complete ADL's.    Baseline 01/27/2021= 42%    Time 12    Period Weeks    Status New      PT LONG TERM GOAL #3   Title Pt will decrease 5TSTS by at least 3 seconds in order to demonstrate clinically significant improvement in LE strength.    Baseline 01/27/2021= 30.07 without UE support; 03/18/2021= 24.78 sec without UE Support    Time 12    Period Weeks    Status New    Target Date 04/21/21      PT LONG TERM GOAL #4   Title Pt will decrease TUG to below 15  seconds/decrease in order to demonstrate decreased fall risk.    Baseline 01/27/2021= 20.97 without an AD; 03/18/2021= 19.2 sec without AD    Time 12    Period Weeks    Status On-going    Target Date 04/21/21      PT LONG TERM GOAL #5   Title Pt will increase 10MWT by at least 0.13 m/s in order to demonstrate clinically significant improvement in community ambulation.    Baseline 01/27/2021= 0.67 m/s without an AD.  9/14= 0.75 m/s    Time 12    Period Weeks    Status New      PT LONG TERM GOAL #6   Title Pt will increase 6MWT by at least 57m (153ft) in order to demonstrate clinically significant improvement in cardiopulmonary endurance and community ambulation    Baseline 02/03/2021= 1100 feet; 03/18/21= 1125 feet without AD.    Time 11    Period Weeks    Status New                   Plan - 04/13/21 1356     Clinical Impression Statement Patient challenged with all cone activities exhibiting some difficulty with coordinating movements. He  continues to benefit from cueing and did not have any significant loss of balance. He did have difficulty scanning room for cones and required several trips and Cues to locate today. He remains well motivated and was able to scan room without dizziness or loss of balance. Pt will continue to bnefit from skilled physical therapy intervention in order to improve his LE strength, endurance, balance, and to reduce his risk and frequency of falls.    Personal Factors and Comorbidities Comorbidity 3+    Comorbidities Anoxic brain injury (1999), Cardiac (pacemaker/debrillator), HTN    Examination-Activity Limitations Bathing;Caring for Others;Lift;Stairs;Transfers    Examination-Participation Restrictions Community Activity;Yard Work    Stability/Clinical Decision Making Stable/Uncomplicated    Rehab Potential Good    PT Frequency 2x / week    PT Duration 12 weeks    PT Treatment/Interventions ADLs/Self Care Home Management;Cryotherapy;Moist Heat;DME Instruction;Gait training;Stair training;Functional mobility training;Therapeutic activities;Therapeutic exercise;Balance training;Neuromuscular re-education;Patient/family education;Manual techniques;Passive range of motion;Dry needling    PT Next Visit Plan Progress LE strengthening next visit and continue with  balance exercises.    PT Home Exercise Plan No changes this visit    Consulted and Agree with Plan of Care Patient;Family member/caregiver    Family Member Consulted Wife- [name]             Patient will benefit from skilled therapeutic intervention in order to improve the following deficits and impairments:  Abnormal gait, Decreased activity tolerance, Decreased balance, Decreased cognition, Decreased coordination, Decreased endurance, Decreased mobility, Decreased strength, Difficulty walking  Visit Diagnosis: Abnormality of gait and mobility  Difficulty in walking, not elsewhere classified  Muscle  weakness (generalized)  Unsteadiness  on feet     Problem List Patient Active Problem List   Diagnosis Date Noted   Gait abnormality 12/16/2020   Nonintractable headache 09/30/2020   Tinnitus 09/09/2019   History of sudden cardiac arrest 01/09/2019   Viral syndrome 09/06/2018   Seizures ([place]) 03/02/2018   Syncope 12/26/2017   Other social stressor 12/26/2017   Health care maintenance 08/14/2017   Family hx of prostate cancer    Allergy    Insomnia 10/20/2015   Advance care planning 03/08/2014   Memory loss 09/14/2013   Hypoxic brain injury ([place]) 09/14/2013   Medicare annual wellness visit, subsequent 02/16/2013   Shortness of breath 08/10/2011   Implantable cardioverter-defibrillator (ICD) in situ 02/03/2009   Hypothyroidism 11/18/2008   CONTACT DERMATITIS&OTHER ECZEMA DUE TO PLANTS 02/03/2008   IDIOPATHIC URTICARIA 02/03/2008   Hypertension 02/03/2008   HLD (hyperlipidemia) 2007-11-24   Attention deficit disorder 11-24-07   PVC/VT non sustained 11/24/07   Sleep apnea 11/24/07   HEADACHE 11/24/2007   SUDDEN DEATH-aborted Nov 24, 2007   LOW BACK PAIN SYNDROME 10/11/2007    Lewis Moccasin, PT 04/13/2021, 3:03 PM  [place] MAIN [facility] SERVICES [address] [place], [place], [address] Phone: [phone]   Fax:  [phone]  Name: Larry [name] MRN: [id number] Date of Birth: Jun 26, 1951

## 2021-04-14 LAB — CUP PACEART REMOTE DEVICE CHECK
Battery Remaining Longevity: 30 mo
Battery Remaining Percentage: 35 %
Brady Statistic RV Percent Paced: 0 %
Date Time Interrogation Session: 20221007032100
HighPow Impedance: 66 Ohm
Implantable Lead Implant Date: 19990204
Implantable Lead Location: 753860
Implantable Lead Model: 135
Implantable Lead Serial Number: 301213
Implantable Pulse Generator Implant Date: 20111228
Lead Channel Impedance Value: 761 Ohm
Lead Channel Pacing Threshold Amplitude: 0.9 V
Lead Channel Pacing Threshold Pulse Width: 1 ms
Lead Channel Setting Pacing Amplitude: 2.4 V
Lead Channel Setting Pacing Pulse Width: 1 ms
Lead Channel Setting Sensing Sensitivity: 0.4 mV
Pulse Gen Serial Number: 277857

## 2021-04-15 ENCOUNTER — Ambulatory Visit: Payer: Medicare Other

## 2021-04-15 ENCOUNTER — Other Ambulatory Visit: Payer: Self-pay

## 2021-04-15 DIAGNOSIS — M6281 Muscle weakness (generalized): Secondary | ICD-10-CM

## 2021-04-15 DIAGNOSIS — R296 Repeated falls: Secondary | ICD-10-CM | POA: Diagnosis not present

## 2021-04-15 DIAGNOSIS — R269 Unspecified abnormalities of gait and mobility: Secondary | ICD-10-CM

## 2021-04-15 DIAGNOSIS — R2681 Unsteadiness on feet: Secondary | ICD-10-CM

## 2021-04-15 DIAGNOSIS — R262 Difficulty in walking, not elsewhere classified: Secondary | ICD-10-CM | POA: Diagnosis not present

## 2021-04-15 DIAGNOSIS — R278 Other lack of coordination: Secondary | ICD-10-CM | POA: Diagnosis not present

## 2021-04-15 NOTE — Therapy (Signed)
[place] MAIN [facility] SERVICES [address] [place], [place], [address] Phone: [phone]   Fax:  [phone]  Physical Therapy Treatment  Patient Details  Name: Larry [name] MRN: [id number] Date of Birth: Aug 09, 1950 Referring Provider (PT): Dr. Krista Blue   Encounter Date: 04/15/2021   PT End of Session - 04/15/21 1307     Visit Number 15    Number of Visits 25    Date for PT Re-Evaluation 04/21/21    Authorization Type UHC Medicare    Authorization Time Period 01/27/2021- 04/21/2021    PT Start Time 1302    PT Stop Time 1343    PT Time Calculation (min) 41 min    Equipment Utilized During Treatment Gait belt    Activity Tolerance Patient tolerated treatment well;No increased pain    Behavior During Therapy WFL for tasks assessed/performed             Past Medical History:  Diagnosis Date   ADD (attention deficit disorder)    Allergy    seasonal flares of perinneal allergies   Cancer ([place])    melanoma   Cardiac arrest ([place]) 07/1997   aborted   Dementia ([place])    post resusitative   Depression    Diverticulosis    Family hx of prostate cancer    GERD (gastroesophageal reflux disease)    Headache(784.0)    Hyperlipidemia    Hypothyroidism    ICD (implantable cardiac defibrillator) in place    Idiopathic urticaria    Internal hemorrhoids    Low back pain syndrome    Melanoma in situ of skin of trunk ([place])    chest   PVC (premature ventricular contraction)    associated with cardiac arrest   Sleep apnea    no c-pap   Tubular adenoma of colon 10/2010    Past Surgical History:  Procedure Laterality Date    implantation x2     CARDIAC DEFIBRILLATOR PLACEMENT  07/01/2010   Explanation of a previously implanted device, pocket revision, and insertion of a new device, and intraoperative defibrillation threshold testing Caryl Comes)   CARDIAC DEFIBRILLATOR REMOVAL  2004   Defibrillator change Caryl Comes) 3 times changed   CATARACT  EXTRACTION W/PHACO Left 02/04/2021   Procedure: CATARACT EXTRACTION PHACO AND INTRAOCULAR LENS PLACEMENT ([place]) LEFT VIVITY toric LENS 9.21 01:11.6;  Surgeon: Leandrew Koyanagi, MD;  Location: [place];  Service: Ophthalmology;  Laterality: Left;  keep patient at 11:30 arrival   CATARACT EXTRACTION W/PHACO Right 03/04/2021   Procedure: CATARACT EXTRACTION PHACO AND INTRAOCULAR LENS PLACEMENT ([place]) RIGHT VIVITY LENS 8.59 01:15.3;  Surgeon: Leandrew Koyanagi, MD;  Location: [place];  Service: Ophthalmology;  Laterality: Right;   HEMORRHOID SURGERY     ICD     TONSILLECTOMY      There were no vitals filed for this visit.   Subjective Assessment - 04/15/21 1306     Subjective Patient reports doing well overall without new complaints.    Patient is accompained by: Family member    Pertinent History Per note submitted by Dr. Krista Blue on 12/15/2020- The patient has an underlying medical history of cardiac death from ventricular fibrillation with hypoxic brain injury in Jan 1999 due to arrythmia, 15 minutes without heart beat,  status post defibrillator placement, He stayed in hospital for 35 days. On 12/16/2020-Gait abnormality, Also related to the previous anoxic brain injury, deconditioning,    Limitations Walking;House hold activities    How long can you sit comfortably?  no limitations    How long can you stand comfortably? Not really limited    How long can you walk comfortably? 15-20    Patient Stated Goals To improve transfers, balance and walking.    Currently in Pain? No/denies              INTERVENTIONS:    Neuromuscular re-ed: Dual task with the following:   Sit to stand while holding onto green theraball - lifting overhead once standing and naming animal once completely up and then once back down in seated position.   Mini lunge- 4 reps BLE in 40 sec; count backward by 5 (start at 75) - 11 (all correct in 40 sec) - then combined activity- only able to  complete 2 reps BLE along with total of 6 (count backward from 75) in 40 sec.  Patient then performed just mini lunge in // bars without cognitive component another 8 reps BLE.   Forward/retro gait in //bars - down and back in 20 sec for physical task x 2 trials consistently. Then performed cognitive portion of task- name as many college sports team as you can in 20 sec= 11. Combined the task and patient required 41 sec and able to recall 6 team in that time to travel down and back.     Therapeutic exercises:   Side step over hedgehogs (5) in // bars x 6  trips left to right then back right to left- Occasional mis step and reach for UE support but did improve with each  trip.   Calf raises on 1/2 foam roll- 15 reps BLE with VC for correct technique and to perform slowly with 2 sec hold.   Toe raises on 1/2 foam roll - 15 reps BLE.   Step up onto 6" block while holding onto green theraball x 12 reps each LE- mild instability initially yet improved with practice- CGA for support.  Education provided throughout session via VC/TC and demonstration to facilitate movement at target joints and correct muscle activation for all exercises performed.   Clinical Impression: Patient performed well with all strengthening and the physical components of all tasks today. Yet when combined with a cognitive task patient struggled more and difficulty with dual task activities requiring more time and more unsteadiness- difficulty completing tasks. He did improve with practice today and  will continue to bnefit from skilled physical therapy intervention in order to improve his LE strength, endurance, balance, and to reduce his risk and frequency of falls.                          PT Education - 04/15/21 1307     Education Details Exercise technique    Person(s) Educated Patient    Methods Explanation;Demonstration;Tactile cues;Verbal cues    Comprehension Verbalized understanding;Returned  demonstration;Verbal cues required;Need further instruction              PT Short Term Goals - 03/18/21 1306       PT SHORT TERM GOAL #1   Title Pt will be independent with HEP in order to improve strength and balance in order to decrease fall risk and improve function at home and work.    Baseline 01/27/2021- Patient presents with no formal HEP in place. 03/18/2021- Patient is knowledgeable of HEP- WIfe reports he is not always compliant but not because I don't know what to do."    Time 6    Period Weeks  Status Achieved    Target Date 03/10/21               PT Long Term Goals - 03/18/21 1310       PT LONG TERM GOAL #1   Title Pt will improve BERG by at least 5 points in order to demonstrate clinically significant improvement in balance.    Baseline 01/27/2021= 43/56; 03/18/2021=50/56    Time 12    Period Weeks    Status Achieved      PT LONG TERM GOAL #2   Title Pt will improve FOTO to target score of 55% to display perceived improvements in ability to complete ADL's.    Baseline 01/27/2021= 42%    Time 12    Period Weeks    Status New      PT LONG TERM GOAL #3   Title Pt will decrease 5TSTS by at least 3 seconds in order to demonstrate clinically significant improvement in LE strength.    Baseline 01/27/2021= 30.07 without UE support; 03/18/2021= 24.78 sec without UE Support    Time 12    Period Weeks    Status New    Target Date 04/21/21      PT LONG TERM GOAL #4   Title Pt will decrease TUG to below 15  seconds/decrease in order to demonstrate decreased fall risk.    Baseline 01/27/2021= 20.97 without an AD; 03/18/2021= 19.2 sec without AD    Time 12    Period Weeks    Status On-going    Target Date 04/21/21      PT LONG TERM GOAL #5   Title Pt will increase 10MWT by at least 0.13 m/s in order to demonstrate clinically significant improvement in community ambulation.    Baseline 01/27/2021= 0.67 m/s without an AD.  9/14= 0.75 m/s    Time 12    Period Weeks     Status New      PT LONG TERM GOAL #6   Title Pt will increase 6MWT by at least 30m (161ft) in order to demonstrate clinically significant improvement in cardiopulmonary endurance and community ambulation    Baseline 02/03/2021= 1100 feet; 03/18/21= 1125 feet without AD.    Time 11    Period Weeks    Status New                   Plan - 04/15/21 1405     Clinical Impression Statement Patient performed well with all strengthening and the physical components of all tasks today. Yet when combined with a cognitive task patient struggled more and difficulty with dual task activities requiring more time and more unsteadiness- difficulty completing tasks. He did improve with practice today and   will continue to bnefit from skilled physical therapy intervention in order to improve his LE strength, endurance, balance, and to reduce his risk and frequency of falls.    Personal Factors and Comorbidities Comorbidity 3+    Comorbidities Anoxic brain injury (1999), Cardiac (pacemaker/debrillator), HTN    Examination-Activity Limitations Bathing;Caring for Others;Lift;Stairs;Transfers    Examination-Participation Restrictions Community Activity;Yard Work    Stability/Clinical Decision Making Stable/Uncomplicated    Rehab Potential Good    PT Frequency 2x / week    PT Duration 12 weeks    PT Treatment/Interventions ADLs/Self Care Home Management;Cryotherapy;Moist Heat;DME Instruction;Gait training;Stair training;Functional mobility training;Therapeutic activities;Therapeutic exercise;Balance training;Neuromuscular re-education;Patient/family education;Manual techniques;Passive range of motion;Dry needling    PT Next Visit Plan Progress LE strengthening next visit and continue with  balance exercises.    PT Home Exercise Plan No changes this visit    Consulted and Agree with Plan of Care Patient;Family member/caregiver    Family Member Consulted Wife- [name]             Patient will benefit  from skilled therapeutic intervention in order to improve the following deficits and impairments:  Abnormal gait, Decreased activity tolerance, Decreased balance, Decreased cognition, Decreased coordination, Decreased endurance, Decreased mobility, Decreased strength, Difficulty walking  Visit Diagnosis: Abnormality of gait and mobility  Difficulty in walking, not elsewhere classified  Muscle weakness (generalized)  Unsteadiness on feet     Problem List Patient Active Problem List   Diagnosis Date Noted   Gait abnormality 12/16/2020   Nonintractable headache 09/30/2020   Tinnitus 09/09/2019   History of sudden cardiac arrest 01/09/2019   Viral syndrome 09/06/2018   Seizures ([place]) 03/02/2018   Syncope 12/26/2017   Other social stressor 12/26/2017   Health care maintenance 08/14/2017   Family hx of prostate cancer    Allergy    Insomnia 10/20/2015   Advance care planning 03/08/2014   Memory loss 09/14/2013   Hypoxic brain injury ([place]) 09/14/2013   Medicare annual wellness visit, subsequent 02/16/2013   Shortness of breath 08/10/2011   Implantable cardioverter-defibrillator (ICD) in situ 02/03/2009   Hypothyroidism 11/18/2008   CONTACT DERMATITIS&OTHER ECZEMA DUE TO PLANTS 02/03/2008   IDIOPATHIC URTICARIA 02/03/2008   Hypertension 02/03/2008   HLD (hyperlipidemia) 11/05/2007   Attention deficit disorder Nov 05, 2007   PVC/VT non sustained 11-05-2007   Sleep apnea 11/05/07   HEADACHE 11/05/2007   SUDDEN DEATH-aborted Nov 05, 2007   LOW BACK PAIN SYNDROME 10/11/2007    Lewis Moccasin, PT 04/15/2021, 2:08 PM  [facility] MAIN [facility] SERVICES [address] [place], [place], [address] Phone: [phone]   Fax:  [phone]  Name: Larry [name] MRN: [id number] Date of Birth: May 30, 1951

## 2021-04-16 ENCOUNTER — Ambulatory Visit: Payer: Medicare Other

## 2021-04-20 ENCOUNTER — Ambulatory Visit: Payer: Medicare Other

## 2021-04-20 ENCOUNTER — Other Ambulatory Visit: Payer: Self-pay

## 2021-04-20 DIAGNOSIS — R262 Difficulty in walking, not elsewhere classified: Secondary | ICD-10-CM

## 2021-04-20 DIAGNOSIS — R278 Other lack of coordination: Secondary | ICD-10-CM | POA: Diagnosis not present

## 2021-04-20 DIAGNOSIS — R269 Unspecified abnormalities of gait and mobility: Secondary | ICD-10-CM | POA: Diagnosis not present

## 2021-04-20 DIAGNOSIS — R2681 Unsteadiness on feet: Secondary | ICD-10-CM | POA: Diagnosis not present

## 2021-04-20 DIAGNOSIS — R296 Repeated falls: Secondary | ICD-10-CM | POA: Diagnosis not present

## 2021-04-20 DIAGNOSIS — M6281 Muscle weakness (generalized): Secondary | ICD-10-CM

## 2021-04-20 NOTE — Progress Notes (Signed)
Remote ICD transmission.   

## 2021-04-20 NOTE — Therapy (Signed)
[place] MAIN [facility] SERVICES [address] [place], [place], [address] Phone: [phone]   Fax:  [phone]  Physical Therapy Treatment  Patient Details  Name: Larry [name] MRN: [id number] Date of Birth: 03-Nov-1950 Referring Provider (PT): Dr. Krista Blue   Encounter Date: 04/20/2021   PT End of Session - 04/20/21 1056     Visit Number 16    Number of Visits 25    Date for PT Re-Evaluation 04/21/21    Authorization Type UHC Medicare    Authorization Time Period 01/27/2021- 04/21/2021    PT Start Time 1348    PT Stop Time 1429    PT Time Calculation (min) 41 min    Equipment Utilized During Treatment Gait belt    Activity Tolerance Patient tolerated treatment well;No increased pain    Behavior During Therapy WFL for tasks assessed/performed             Past Medical History:  Diagnosis Date   ADD (attention deficit disorder)    Allergy    seasonal flares of perinneal allergies   Cancer ([place])    melanoma   Cardiac arrest ([place]) 07/1997   aborted   Dementia ([place])    post resusitative   Depression    Diverticulosis    Family hx of prostate cancer    GERD (gastroesophageal reflux disease)    Headache(784.0)    Hyperlipidemia    Hypothyroidism    ICD (implantable cardiac defibrillator) in place    Idiopathic urticaria    Internal hemorrhoids    Low back pain syndrome    Melanoma in situ of skin of trunk ([place])    chest   PVC (premature ventricular contraction)    associated with cardiac arrest   Sleep apnea    no c-pap   Tubular adenoma of colon 10/2010    Past Surgical History:  Procedure Laterality Date    implantation x2     CARDIAC DEFIBRILLATOR PLACEMENT  07/01/2010   Explanation of a previously implanted device, pocket revision, and insertion of a new device, and intraoperative defibrillation threshold testing Caryl Comes)   CARDIAC DEFIBRILLATOR REMOVAL  2004   Defibrillator change Caryl Comes) 3 times changed   CATARACT  EXTRACTION W/PHACO Left 02/04/2021   Procedure: CATARACT EXTRACTION PHACO AND INTRAOCULAR LENS PLACEMENT ([place]) LEFT VIVITY toric LENS 9.21 01:11.6;  Surgeon: Leandrew Koyanagi, MD;  Location: [facility];  Service: Ophthalmology;  Laterality: Left;  keep patient at 11:30 arrival   CATARACT EXTRACTION W/PHACO Right 03/04/2021   Procedure: CATARACT EXTRACTION PHACO AND INTRAOCULAR LENS PLACEMENT ([place]) RIGHT VIVITY LENS 8.59 01:15.3;  Surgeon: Leandrew Koyanagi, MD;  Location: [facility];  Service: Ophthalmology;  Laterality: Right;   HEMORRHOID SURGERY     ICD     TONSILLECTOMY      There were no vitals filed for this visit.   Subjective Assessment - 04/20/21 1354     Subjective Patient reports doing well overall without new complaints.    Patient is accompained by: Family member    Pertinent History Per note submitted by Dr. Krista Blue on 12/15/2020- The patient has an underlying medical history of cardiac death from ventricular fibrillation with hypoxic brain injury in Jan 1999 due to arrythmia, 15 minutes without heart beat,  status post defibrillator placement, He stayed in hospital for 35 days. On 12/16/2020-Gait abnormality, Also related to the previous anoxic brain injury, deconditioning,    Limitations Walking;House hold activities    How long can you sit comfortably?  no limitations    How long can you stand comfortably? Not really limited    How long can you walk comfortably? 15-20    Patient Stated Goals To improve transfers, balance and walking.    Currently in Pain? No/denies             INTERVENTIONS:    Therapeutic Exercises:   Precor Leg press 25 lb x 12 reps; 40 lb x 12 reps; 55 lb x 12 reps. (Increased VC for exercise technique for safe use of equipment)   Precor calf press: 40 lb x 12 reps x 3 sets  Step ups with 5lb AW (alternating) x 12 reps. (No UE support) - Mild difficulty with coordinating task  Step taps (onto 2nd step) using 5lb. AW  (alternating)  x 12 reps BLE. (Increased VC for coordination of tasks)  Side stepping (starting in mini squat position- then side step x 4 left to right then back to left) x 2 sets   Sit to stand from edge of bed without UE support x 12 reps (VC to perform correctly).   Education provided throughout session via VC/TC and demonstration to facilitate movement at target joints and correct muscle activation for all testing and exercises performed.   Clinical Impression: Patient fatigued during session after leg and calf press. He was able to complete activities with increased overall VC yet continues to have coordination issues and unable to complete tasks without repetitive Cues. He did improve overall with practice but reported limited by fatigue mostly today. Patient will continue to bnefit from skilled physical therapy intervention in order to improve his LE strength, endurance, balance, and to reduce his risk and frequency of falls                          PT Education - 04/20/21 1355     Education Details Exercise technique    Person(s) Educated Patient    Methods Explanation;Demonstration;Tactile cues;Verbal cues    Comprehension Verbalized understanding;Returned demonstration;Verbal cues required;Need further instruction              PT Short Term Goals - 03/18/21 1306       PT SHORT TERM GOAL #1   Title Pt will be independent with HEP in order to improve strength and balance in order to decrease fall risk and improve function at home and work.    Baseline 01/27/2021- Patient presents with no formal HEP in place. 03/18/2021- Patient is knowledgeable of HEP- WIfe reports he is not always compliant but not because I don't know what to do."    Time 6    Period Weeks    Status Achieved    Target Date 03/10/21               PT Long Term Goals - 03/18/21 1310       PT LONG TERM GOAL #1   Title Pt will improve BERG by at least 5 points in order to  demonstrate clinically significant improvement in balance.    Baseline 01/27/2021= 43/56; 03/18/2021=50/56    Time 12    Period Weeks    Status Achieved      PT LONG TERM GOAL #2   Title Pt will improve FOTO to target score of 55% to display perceived improvements in ability to complete ADL's.    Baseline 01/27/2021= 42%    Time 12    Period Weeks    Status New  PT LONG TERM GOAL #3   Title Pt will decrease 5TSTS by at least 3 seconds in order to demonstrate clinically significant improvement in LE strength.    Baseline 01/27/2021= 30.07 without UE support; 03/18/2021= 24.78 sec without UE Support    Time 12    Period Weeks    Status New    Target Date 04/21/21      PT LONG TERM GOAL #4   Title Pt will decrease TUG to below 15  seconds/decrease in order to demonstrate decreased fall risk.    Baseline 01/27/2021= 20.97 without an AD; 03/18/2021= 19.2 sec without AD    Time 12    Period Weeks    Status On-going    Target Date 04/21/21      PT LONG TERM GOAL #5   Title Pt will increase 10MWT by at least 0.13 m/s in order to demonstrate clinically significant improvement in community ambulation.    Baseline 01/27/2021= 0.67 m/s without an AD.  9/14= 0.75 m/s    Time 12    Period Weeks    Status New      PT LONG TERM GOAL #6   Title Pt will increase 6MWT by at least 3m (147ft) in order to demonstrate clinically significant improvement in cardiopulmonary endurance and community ambulation    Baseline 02/03/2021= 1100 feet; 03/18/21= 1125 feet without AD.    Time 11    Period Weeks    Status New                   Plan - 04/20/21 1057     Clinical Impression Statement Patient fatigued during session after leg and calf press. He was able to complete activities with increased overall VC yet continues to have coordination issues and unable to complete tasks without repetitive Cues. He did improve overall with practice but reported limited by fatigue mostly today. Patient will  continue to bnefit from skilled physical therapy intervention in order to improve his LE strength, endurance, balance, and to reduce his risk and frequency of falls    Personal Factors and Comorbidities Comorbidity 3+    Comorbidities Anoxic brain injury (1999), Cardiac (pacemaker/debrillator), HTN    Examination-Activity Limitations Bathing;Caring for Others;Lift;Stairs;Transfers    Examination-Participation Restrictions Community Activity;Yard Work    Stability/Clinical Decision Making Stable/Uncomplicated    Rehab Potential Good    PT Frequency 2x / week    PT Duration 12 weeks    PT Treatment/Interventions ADLs/Self Care Home Management;Cryotherapy;Moist Heat;DME Instruction;Gait training;Stair training;Functional mobility training;Therapeutic activities;Therapeutic exercise;Balance training;Neuromuscular re-education;Patient/family education;Manual techniques;Passive range of motion;Dry needling    PT Next Visit Plan Progress LE strengthening next visit and continue with  balance exercises.    PT Home Exercise Plan No changes this visit    Consulted and Agree with Plan of Care Patient;Family member/caregiver    Family Member Consulted Wife- [name]             Patient will benefit from skilled therapeutic intervention in order to improve the following deficits and impairments:  Abnormal gait, Decreased activity tolerance, Decreased balance, Decreased cognition, Decreased coordination, Decreased endurance, Decreased mobility, Decreased strength, Difficulty walking  Visit Diagnosis: Abnormality of gait and mobility  Difficulty in walking, not elsewhere classified  Muscle weakness (generalized)  Unsteadiness on feet     Problem List Patient Active Problem List   Diagnosis Date Noted   Gait abnormality 12/16/2020   Nonintractable headache 09/30/2020   Tinnitus 09/09/2019   History of sudden cardiac arrest 01/09/2019  Viral syndrome 09/06/2018   Seizures ([place]) 03/02/2018    Syncope 12/26/2017   Other social stressor 12/26/2017   Health care maintenance 08/14/2017   Family hx of prostate cancer    Allergy    Insomnia 10/20/2015   Advance care planning 03/08/2014   Memory loss 09/14/2013   Hypoxic brain injury ([place]) 09/14/2013   Medicare annual wellness visit, subsequent 02/16/2013   Shortness of breath 08/10/2011   Implantable cardioverter-defibrillator (ICD) in situ 02/03/2009   Hypothyroidism 11/18/2008   CONTACT DERMATITIS&OTHER ECZEMA DUE TO PLANTS 02/03/2008   IDIOPATHIC URTICARIA 02/03/2008   Hypertension 02/03/2008   HLD (hyperlipidemia) 11/26/2007   Attention deficit disorder 11/26/07   PVC/VT non sustained 2007-11-26   Sleep apnea 11-26-2007   HEADACHE Nov 26, 2007   SUDDEN DEATH-aborted 2007/11/26   LOW BACK PAIN SYNDROME 10/11/2007    Lewis Moccasin, PT 04/21/2021, 11:21 AM  [place] MAIN [facility] SERVICES [address] [place], [place], [address] Phone: [phone]   Fax:  [phone]  Name: Larry [name] MRN: [id number] Date of Birth: [birth year]

## 2021-04-22 ENCOUNTER — Ambulatory Visit: Payer: Medicare Other

## 2021-04-22 ENCOUNTER — Other Ambulatory Visit: Payer: Self-pay

## 2021-04-22 DIAGNOSIS — R269 Unspecified abnormalities of gait and mobility: Secondary | ICD-10-CM | POA: Diagnosis not present

## 2021-04-22 DIAGNOSIS — M6281 Muscle weakness (generalized): Secondary | ICD-10-CM

## 2021-04-22 DIAGNOSIS — R2681 Unsteadiness on feet: Secondary | ICD-10-CM | POA: Diagnosis not present

## 2021-04-22 DIAGNOSIS — R262 Difficulty in walking, not elsewhere classified: Secondary | ICD-10-CM | POA: Diagnosis not present

## 2021-04-22 DIAGNOSIS — R278 Other lack of coordination: Secondary | ICD-10-CM | POA: Diagnosis not present

## 2021-04-22 DIAGNOSIS — R296 Repeated falls: Secondary | ICD-10-CM | POA: Diagnosis not present

## 2021-04-22 NOTE — Therapy (Signed)
[place] MAIN [facility] SERVICES [address] [place], [place], [address] Phone: [phone]   Fax:  [phone]  Physical Therapy Treatment/Recertification for dates 04/22/2021- 07/15/2021  Patient Details  Name: Larry [name] MRN: [id number] Date of Birth: Nov 25, 1950 Referring Provider (PT): Dr. Krista Blue   Encounter Date: 04/22/2021   PT End of Session - 04/22/21 1313     Visit Number 17    Number of Visits 29    Date for PT Re-Evaluation 07/15/21    Authorization Type UHC Medicare    Authorization Time Period 01/27/2021- 82/50/5397; Recert 67/34/1937- 90/24/0973    PT Start Time 1306    PT Stop Time 1344    PT Time Calculation (min) 38 min    Equipment Utilized During Treatment Gait belt    Activity Tolerance Patient tolerated treatment well;No increased pain    Behavior During Therapy WFL for tasks assessed/performed             Past Medical History:  Diagnosis Date   ADD (attention deficit disorder)    Allergy    seasonal flares of perinneal allergies   Cancer ([place])    melanoma   Cardiac arrest ([place]) 07/1997   aborted   Dementia ([place])    post resusitative   Depression    Diverticulosis    Family hx of prostate cancer    GERD (gastroesophageal reflux disease)    Headache(784.0)    Hyperlipidemia    Hypothyroidism    ICD (implantable cardiac defibrillator) in place    Idiopathic urticaria    Internal hemorrhoids    Low back pain syndrome    Melanoma in situ of skin of trunk ([place])    chest   PVC (premature ventricular contraction)    associated with cardiac arrest   Sleep apnea    no c-pap   Tubular adenoma of colon 10/2010    Past Surgical History:  Procedure Laterality Date    implantation x2     CARDIAC DEFIBRILLATOR PLACEMENT  07/01/2010   Explanation of a previously implanted device, pocket revision, and insertion of a new device, and intraoperative defibrillation threshold testing Caryl Comes)   CARDIAC  DEFIBRILLATOR REMOVAL  2004   Defibrillator change Caryl Comes) 3 times changed   CATARACT EXTRACTION W/PHACO Left 02/04/2021   Procedure: CATARACT EXTRACTION PHACO AND INTRAOCULAR LENS PLACEMENT ([place]) LEFT VIVITY toric LENS 9.21 01:11.6;  Surgeon: Leandrew Koyanagi, MD;  Location: [facility];  Service: Ophthalmology;  Laterality: Left;  keep patient at 11:30 arrival   CATARACT EXTRACTION W/PHACO Right 03/04/2021   Procedure: CATARACT EXTRACTION PHACO AND INTRAOCULAR LENS PLACEMENT ([place]) RIGHT VIVITY LENS 8.59 01:15.3;  Surgeon: Leandrew Koyanagi, MD;  Location: [facility];  Service: Ophthalmology;  Laterality: Right;   HEMORRHOID SURGERY     ICD     TONSILLECTOMY      There were no vitals filed for this visit.   Subjective Assessment - 04/22/21 1311     Subjective Patient reports having a busy day but overall-  a good day.    Patient is accompained by: Family member    Pertinent History Per note submitted by Dr. Krista Blue on 12/15/2020- The patient has an underlying medical history of cardiac death from ventricular fibrillation with hypoxic brain injury in Jan 1999 due to arrythmia, 15 minutes without heart beat,  status post defibrillator placement, He stayed in hospital for 35 days. On 12/16/2020-Gait abnormality, Also related to the previous anoxic brain injury, deconditioning,    Limitations Walking;House  hold activities    How long can you sit comfortably? no limitations    How long can you stand comfortably? Not really limited    How long can you walk comfortably? 15-20    Patient Stated Goals To improve transfers, balance and walking.    Currently in Pain? No/denies             INTERVENTIONS:   Nustep: LE only x 6 min for interval for cardiopulmonary strength-  34mn at level 0 30 sec at level 3 30 sec at leve 5 1 min at level 1 30 sec at level 4 30 sec at level 6 2 min level 0 Patient reported activity as "hard" O2 sat = 97% and HR= 90 bpm  Reassessed Long  term goals for recertification:    5 time sit to stand= 14.47 sec without UE support- improved from 24.78 sec on 03/18/2021- GOAL MET  TUG= 17.0 sec without assistive device- improved from 19.2 sec on 9/14  6 minute walk test= 1175 feet without AD   10 meter walk test= 0.82 m/s using no AD    Clinical Impression: Patient is demonstrating steady progression with LE strength, balance, and functional mobility. He has met his functional strength goal- 5xSTS; improving on his TIMED up and GO score with decreased risk of falling; Improving gait speed steadily; and improving functional endurance as seen by the 6 min walk. He remains motivated to continue to progress his overall LE strength, balance, and functional endurance. Patient's condition has the potential to improve in response to therapy. Maximum improvement is yet to be obtained. The anticipated improvement is attainable and reasonable in a generally predictable time.                       PT Education - 04/22/21 1312     Education Details Exercise technique; purpose of interval training    Person(s) Educated Patient    Methods Explanation;Demonstration;Tactile cues;Verbal cues    Comprehension Verbalized understanding;Returned demonstration;Verbal cues required;Need further instruction              PT Short Term Goals - 03/18/21 1306       PT SHORT TERM GOAL #1   Title Pt will be independent with HEP in order to improve strength and balance in order to decrease fall risk and improve function at home and work.    Baseline 01/27/2021- Patient presents with no formal HEP in place. 03/18/2021- Patient is knowledgeable of HEP- WIfe reports he is not always compliant but not because I don't know what to do."    Time 6    Period Weeks    Status Achieved    Target Date 03/10/21               PT Long Term Goals - 04/22/21 1538       PT LONG TERM GOAL #1   Title Pt will improve BERG by at least 5 points in  order to demonstrate clinically significant improvement in balance.    Baseline 01/27/2021= 43/56; 03/18/2021=50/56    Time 12    Period Weeks    Status Achieved      PT LONG TERM GOAL #2   Title Pt will improve FOTO to target score of 55% to display perceived improvements in ability to complete ADL's.    Baseline 01/27/2021= 42%; 04/22/2021- Will assess next visit    Time 12    Period Weeks    Status On-going  Target Date 07/15/21      PT LONG TERM GOAL #3   Title Pt will decrease 5TSTS by at least 3 seconds in order to demonstrate clinically significant improvement in LE strength.    Baseline 01/27/2021= 30.07 without UE support; 03/18/2021= 24.78 sec without UE Support; 04/22/2021= 14.47 sec without UE support    Time 12    Period Weeks    Status Achieved      PT LONG TERM GOAL #4   Title Pt will decrease TUG to below 15  seconds/decrease in order to demonstrate decreased fall risk.    Baseline 01/27/2021= 20.97 without an AD; 03/18/2021= 19.2 sec without AD; 04/22/2021= 17 sec without AD    Time 12    Period Weeks    Status On-going    Target Date 07/15/21      PT LONG TERM GOAL #5   Title Pt will increase 10MWT by at least 0.13 m/s in order to demonstrate clinically significant improvement in community ambulation.    Baseline 01/27/2021= 0.67 m/s without an AD.  9/14= 0.75 m/s; 04/22/2021= 0.82 sec without an AD- WIll keep goal active to ensure patient can perform consistently.    Time 12    Period Weeks    Status Partially Met    Target Date 07/15/21      PT LONG TERM GOAL #6   Title Pt will increase 6MWT by at least 23m(1665f in order to demonstrate clinically significant improvement in cardiopulmonary endurance and community ambulation    Baseline 02/03/2021= 1100 feet; 03/18/21= 1125 feet without AD.  04/22/2021= 1175 feet without AD    Time 11    Period Weeks    Status On-going    Target Date 07/15/21                   Plan - 04/22/21 1314     Clinical  Impression Statement Patient is demonstrating steady progression with LE strength, balance, and functional mobility. He has met his functional strength goal- 5xSTS; improving on his TIMED up and GO score with decreased risk of falling; Improving gait speed steadily; and improving functional endurance as seen by the 6 min walk. He remains motivated to continue to progress his overall LE strength, balance, and functional endurance. Patient's condition has the potential to improve in response to therapy. Maximum improvement is yet to be obtained. The anticipated improvement is attainable and reasonable in a generally predictable time.    Personal Factors and Comorbidities Comorbidity 3+    Comorbidities Anoxic brain injury (1999), Cardiac (pacemaker/debrillator), HTN    Examination-Activity Limitations Bathing;Caring for Others;Lift;Stairs;Transfers    Examination-Participation Restrictions Community Activity;Yard Work    Stability/Clinical Decision Making Stable/Uncomplicated    Rehab Potential Good    PT Frequency 1x / week    PT Duration 12 weeks    PT Treatment/Interventions ADLs/Self Care Home Management;Cryotherapy;Moist Heat;DME Instruction;Gait training;Stair training;Functional mobility training;Therapeutic activities;Therapeutic exercise;Balance training;Neuromuscular re-education;Patient/family education;Manual techniques;Passive range of motion;Dry needling;Canalith Repostioning;Ultrasound;Vestibular;Joint Manipulations    PT Next Visit Plan Progress LE strengthening next visit and continue with  balance exercises.    PT Home Exercise Plan No changes this visit    Consulted and Agree with Plan of Care Patient;Family member/caregiver    Family Member Consulted Wife- KaJuliann Pulse           Patient will benefit from skilled therapeutic intervention in order to improve the following deficits and impairments:  Abnormal gait, Decreased activity tolerance, Decreased balance, Decreased cognition,  Decreased coordination, Decreased endurance, Decreased mobility, Decreased strength, Difficulty walking  Visit Diagnosis: Abnormality of gait and mobility  Difficulty in walking, not elsewhere classified  Muscle weakness (generalized)  Unsteadiness on feet     Problem List Patient Active Problem List   Diagnosis Date Noted   Gait abnormality 12/16/2020   Nonintractable headache 09/30/2020   Tinnitus 09/09/2019   History of sudden cardiac arrest 01/09/2019   Viral syndrome 09/06/2018   Seizures ([place]) 03/02/2018   Syncope 12/26/2017   Other social stressor 12/26/2017   Health care maintenance 08/14/2017   Family hx of prostate cancer    Allergy    Insomnia 10/20/2015   Advance care planning 03/08/2014   Memory loss 09/14/2013   Hypoxic brain injury ([place]) 09/14/2013   Medicare annual wellness visit, subsequent 02/16/2013   Shortness of breath 08/10/2011   Implantable cardioverter-defibrillator (ICD) in situ 02/03/2009   Hypothyroidism 11/18/2008   CONTACT DERMATITIS&OTHER ECZEMA DUE TO PLANTS 02/03/2008   IDIOPATHIC URTICARIA 02/03/2008   Hypertension 02/03/2008   HLD (hyperlipidemia) 11/11/07   Attention deficit disorder 11-Nov-2007   PVC/VT non sustained 2007-11-11   Sleep apnea 11/11/2007   HEADACHE 11-11-07   SUDDEN DEATH-aborted 11-Nov-2007   LOW BACK PAIN SYNDROME 10/11/2007    Lewis Moccasin, PT 04/23/2021, 3:54 PM  [place] MAIN [facility] SERVICES [address] [place], [place], [address] Phone: [phone]   Fax:  [phone]  Name: Larry [name] MRN: [id number] Date of Birth: 1950-12-29

## 2021-04-23 ENCOUNTER — Ambulatory Visit: Payer: Medicare Other

## 2021-04-27 ENCOUNTER — Other Ambulatory Visit: Payer: Self-pay

## 2021-04-27 ENCOUNTER — Ambulatory Visit: Payer: Medicare Other

## 2021-04-27 DIAGNOSIS — R262 Difficulty in walking, not elsewhere classified: Secondary | ICD-10-CM | POA: Diagnosis not present

## 2021-04-27 DIAGNOSIS — M6281 Muscle weakness (generalized): Secondary | ICD-10-CM | POA: Diagnosis not present

## 2021-04-27 DIAGNOSIS — R2681 Unsteadiness on feet: Secondary | ICD-10-CM

## 2021-04-27 DIAGNOSIS — R278 Other lack of coordination: Secondary | ICD-10-CM | POA: Diagnosis not present

## 2021-04-27 DIAGNOSIS — R269 Unspecified abnormalities of gait and mobility: Secondary | ICD-10-CM

## 2021-04-27 DIAGNOSIS — R296 Repeated falls: Secondary | ICD-10-CM | POA: Diagnosis not present

## 2021-04-27 NOTE — Therapy (Signed)
[facility] MAIN REHAB SERVICES [address] [place], [place], [address] Phone: [phone]   Fax:  [phone]  Physical Therapy Treatment  Patient Details  Name: Larry [name] MRN: [id number] Date of Birth: 12/10/1950 Referring Provider (PT): Dr. Yan   Encounter Date: 04/27/2021   PT End of Session - 04/27/21 1358     Visit Number 18    Number of Visits 29    Date for PT Re-Evaluation 07/15/21    Authorization Type UHC Medicare    Authorization Time Period 01/27/2021- 04/21/2021; Recert 04/22/2021- 07/15/2021    PT Start Time 1345    PT Stop Time 1428    PT Time Calculation (min) 43 min    Equipment Utilized During Treatment Gait belt    Activity Tolerance Patient tolerated treatment well;No increased pain    Behavior During Therapy WFL for tasks assessed/performed             Past Medical History:  Diagnosis Date   ADD (attention deficit disorder)    Allergy    seasonal flares of perinneal allergies   Cancer (HCC)    melanoma   Cardiac arrest (HCC) 07/1997   aborted   Dementia (HCC)    post resusitative   Depression    Diverticulosis    Family hx of prostate cancer    GERD (gastroesophageal reflux disease)    Headache(784.0)    Hyperlipidemia    Hypothyroidism    ICD (implantable cardiac defibrillator) in place    Idiopathic urticaria    Internal hemorrhoids    Low back pain syndrome    Melanoma in situ of skin of trunk (HCC)    chest   PVC (premature ventricular contraction)    associated with cardiac arrest   Sleep apnea    no c-pap   Tubular adenoma of colon 10/2010    Past Surgical History:  Procedure Laterality Date    implantation x2     CARDIAC DEFIBRILLATOR PLACEMENT  07/01/2010   Explanation of a previously implanted device, pocket revision, and insertion of a new device, and intraoperative defibrillation threshold testing ([place])   CARDIAC DEFIBRILLATOR REMOVAL  2004   Defibrillator change  ([place]) 3 times changed   CATARACT EXTRACTION W/PHACO Left 02/04/2021   Procedure: CATARACT EXTRACTION PHACO AND INTRAOCULAR LENS PLACEMENT (IOC) LEFT VIVITY toric LENS 9.21 01:11.6;  Surgeon: Brasington, Chadwick, MD;  Location: [facility];  Service: Ophthalmology;  Laterality: Left;  keep patient at 11:30 arrival   CATARACT EXTRACTION W/PHACO Right 03/04/2021   Procedure: CATARACT EXTRACTION PHACO AND INTRAOCULAR LENS PLACEMENT ([facility]) RIGHT VIVITY LENS 8.59 01:15.3;  Surgeon: Brasington, Chadwick, MD;  Location: [facility];  Service: Ophthalmology;  Laterality: Right;   HEMORRHOID SURGERY     ICD     TONSILLECTOMY      There were no vitals filed for this visit.   Subjective Assessment - 04/27/21 1357     Subjective Patient reports having a good weekend  and no new complaints today. Denies any falls and pain.    Patient is accompained by: Family member    Pertinent History Per note submitted by Dr. Yan on 12/15/2020- The patient has an underlying medical history of cardiac death from ventricular fibrillation with hypoxic brain injury in Jan 1999 due to arrythmia, 15 minutes without heart beat,  status post defibrillator placement, He stayed in hospital for 35 days. On 12/16/2020-Gait abnormality, Also related to the previous anoxic brain injury, deconditioning,    Limitations   Walking;House hold activities    How long can you sit comfortably? no limitations    How long can you stand comfortably? Not really limited    How long can you walk comfortably? 15-20    Patient Stated Goals To improve transfers, balance and walking.    Currently in Pain? No/denies             INTERVENTIONS:   Therapeutic Exercises:  Nustep L3 LE only x 6 min- Continuous monitoring for comfort and monitoring of vitals. O2 sat > 97% and HR between 85-121 bpm.  Upon completion O2 sat=98%: HR= 121bpm  Resistive gait using Matrix Cable system x 12.5 lb F- x 5  (difficulty with coordinating steps-  unsteady but did improve with practice.  B x 5 L x 5 R x 5  *patient did improve with increase trials with occasional unsteadiness yet no LOB- Gait belt utilized throughout session.   Sit to stand with ball toss  against wall for coordination x 10 reps - patient with difficulty with dual task requiring VC for smooth movement of combined activity.  Side side  x 8 feet with bounce ball against wall then back to start position.  Difficulty with coordinating movement.  Side step with ball toss - toss ball while side stepping with Student PT- tossing ball and PT providing CGA with patient.   Clinical Impression: Patient was challenged with progression of dynamic balance activities requiring close CGA assist using gait belt. He was well motivated and did respond to verbal cues and visual demo on how to perform combined movement activities- exhibiting difficulty processing and dual tasking. Patient will continue to bnefit from skilled physical therapy intervention in order to improve his LE strength, endurance, balance, and to reduce his risk and frequency of falls                             PT Education - 04/27/21 1358     Education Details Exercise technique    Person(s) Educated Patient    Methods Explanation;Demonstration;Tactile cues;Verbal cues    Comprehension Verbalized understanding;Returned demonstration;Verbal cues required;Need further instruction;Tactile cues required              PT Short Term Goals - 03/18/21 1306       PT SHORT TERM GOAL #1   Title Pt will be independent with HEP in order to improve strength and balance in order to decrease fall risk and improve function at home and work.    Baseline 01/27/2021- Patient presents with no formal HEP in place. 03/18/2021- Patient is knowledgeable of HEP- WIfe reports he is not always compliant but not because I don't know what to do."    Time 6    Period Weeks    Status Achieved    Target Date 03/10/21                PT Long Term Goals - 04/22/21 1538       PT LONG TERM GOAL #1   Title Pt will improve BERG by at least 5 points in order to demonstrate clinically significant improvement in balance.    Baseline 01/27/2021= 43/56; 03/18/2021=50/56    Time 12    Period Weeks    Status Achieved      PT LONG TERM GOAL #2   Title Pt will improve FOTO to target score of 55% to display perceived improvements in ability to complete ADL's.  Baseline 01/27/2021= 42%; 04/22/2021- Will assess next visit    Time 12    Period Weeks    Status On-going    Target Date 07/15/21      PT LONG TERM GOAL #3   Title Pt will decrease 5TSTS by at least 3 seconds in order to demonstrate clinically significant improvement in LE strength.    Baseline 01/27/2021= 30.07 without UE support; 03/18/2021= 24.78 sec without UE Support; 04/22/2021= 14.47 sec without UE support    Time 12    Period Weeks    Status Achieved      PT LONG TERM GOAL #4   Title Pt will decrease TUG to below 15  seconds/decrease in order to demonstrate decreased fall risk.    Baseline 01/27/2021= 20.97 without an AD; 03/18/2021= 19.2 sec without AD; 04/22/2021= 17 sec without AD    Time 12    Period Weeks    Status On-going    Target Date 07/15/21      PT LONG TERM GOAL #5   Title Pt will increase 10MWT by at least 0.13 m/s in order to demonstrate clinically significant improvement in community ambulation.    Baseline 01/27/2021= 0.67 m/s without an AD.  9/14= 0.75 m/s; 04/22/2021= 0.82 sec without an AD- WIll keep goal active to ensure patient can perform consistently.    Time 12    Period Weeks    Status Partially Met    Target Date 07/15/21      PT LONG TERM GOAL #6   Title Pt will increase 6MWT by at least 39m(1692f in order to demonstrate clinically significant improvement in cardiopulmonary endurance and community ambulation    Baseline 02/03/2021= 1100 feet; 03/18/21= 1125 feet without AD.  04/22/2021= 1175 feet without AD     Time 11    Period Weeks    Status On-going    Target Date 07/15/21                   Plan - 04/27/21 1359     Clinical Impression Statement Patient was challenged with progression of dynamic balance activities requiring close CGA assist using gait belt. He was well motivated and did respond to verbal cues and visual demo on how to perform combined movement activities- exhibiting difficulty processing and dual tasking. Patient will continue to bnefit from skilled physical therapy intervention in order to improve his LE strength, endurance, balance, and to reduce his risk and frequency of falls    Personal Factors and Comorbidities Comorbidity 3+    Comorbidities Anoxic brain injury (1999), Cardiac (pacemaker/debrillator), HTN    Examination-Activity Limitations Bathing;Caring for Others;Lift;Stairs;Transfers    Examination-Participation Restrictions Community Activity;Yard Work    Stability/Clinical Decision Making Stable/Uncomplicated    Rehab Potential Good    PT Frequency 1x / week    PT Duration 12 weeks    PT Treatment/Interventions ADLs/Self Care Home Management;Cryotherapy;Moist Heat;DME Instruction;Gait training;Stair training;Functional mobility training;Therapeutic activities;Therapeutic exercise;Balance training;Neuromuscular re-education;Patient/family education;Manual techniques;Passive range of motion;Dry needling;Canalith Repostioning;Ultrasound;Vestibular;Joint Manipulations    PT Next Visit Plan Progress LE strengthening next visit and continue with  balance exercises.    PT Home Exercise Plan No changes this visit    Consulted and Agree with Plan of Care Patient;Family member/caregiver    Family Member Consulted Wife- KaJuliann Pulse           Patient will benefit from skilled therapeutic intervention in order to improve the following deficits and impairments:  Abnormal gait, Decreased activity tolerance, Decreased balance,  Decreased cognition, Decreased  coordination, Decreased endurance, Decreased mobility, Decreased strength, Difficulty walking  Visit Diagnosis: Abnormality of gait and mobility  Difficulty in walking, not elsewhere classified  Muscle weakness (generalized)  Unsteadiness on feet  Other lack of coordination     Problem List Patient Active Problem List   Diagnosis Date Noted   Gait abnormality 12/16/2020   Nonintractable headache 09/30/2020   Tinnitus 09/09/2019   History of sudden cardiac arrest 01/09/2019   Viral syndrome 09/06/2018   Seizures (HCC) 03/02/2018   Syncope 12/26/2017   Other social stressor 12/26/2017   Health care maintenance 08/14/2017   Family hx of prostate cancer    Allergy    Insomnia 10/20/2015   Advance care planning 03/08/2014   Memory loss 09/14/2013   Hypoxic brain injury (HCC) 09/14/2013   Medicare annual wellness visit, subsequent 02/16/2013   Shortness of breath 08/10/2011   Implantable cardioverter-defibrillator (ICD) in situ 02/03/2009   Hypothyroidism 11/18/2008   CONTACT DERMATITIS&OTHER ECZEMA DUE TO PLANTS 02/03/2008   IDIOPATHIC URTICARIA 02/03/2008   Hypertension 02/03/2008   HLD (hyperlipidemia) 10/05/2007   Attention deficit disorder 10/17/2007   PVC/VT non sustained 10/26/2007   Sleep apnea 10/20/2007   HEADACHE 10/11/2007   SUDDEN DEATH-aborted 10/12/2007   LOW BACK PAIN SYNDROME 10/11/2007     N , PT 04/28/2021, 8:37 AM  [facility] MA[facility] RE[facility] [address] [place], [place], [address] Phone: [phone]   Fax:  [phone]  Name: Larry [name] MRN: [id number] Date of Birth: 01/01/1951    

## 2021-04-29 ENCOUNTER — Ambulatory Visit: Payer: Medicare Other

## 2021-04-30 ENCOUNTER — Ambulatory Visit: Payer: Medicare Other

## 2021-05-04 ENCOUNTER — Ambulatory Visit: Payer: Medicare Other

## 2021-05-06 ENCOUNTER — Other Ambulatory Visit: Payer: Self-pay

## 2021-05-06 ENCOUNTER — Ambulatory Visit: Payer: Medicare Other | Attending: Neurology

## 2021-05-06 DIAGNOSIS — R269 Unspecified abnormalities of gait and mobility: Secondary | ICD-10-CM | POA: Insufficient documentation

## 2021-05-06 DIAGNOSIS — R262 Difficulty in walking, not elsewhere classified: Secondary | ICD-10-CM | POA: Diagnosis not present

## 2021-05-06 DIAGNOSIS — R2681 Unsteadiness on feet: Secondary | ICD-10-CM | POA: Diagnosis not present

## 2021-05-06 DIAGNOSIS — M6281 Muscle weakness (generalized): Secondary | ICD-10-CM | POA: Diagnosis not present

## 2021-05-06 NOTE — Therapy (Signed)
[place] MAIN [facility] SERVICES [address] [place], [place], [address] Phone: [phone]   Fax:  [phone]  Physical Therapy Treatment  Patient Details  Name: Larry [name] MRN: [id number] Date of Birth: 29-Sep-1950 Referring Provider (PT): Dr. Krista Blue   Encounter Date: 05/06/2021   PT End of Session - 05/06/21 1311     Visit Number 19    Number of Visits 29    Date for PT Re-Evaluation 07/15/21    Authorization Type UHC Medicare    Authorization Time Period 01/27/2021- 84/53/6468; Recert 09/23/2246- 25/00/3704    PT Start Time 1306    PT Stop Time 1344    PT Time Calculation (min) 38 min    Equipment Utilized During Treatment Gait belt    Activity Tolerance Patient tolerated treatment well;No increased pain    Behavior During Therapy WFL for tasks assessed/performed             Past Medical History:  Diagnosis Date   ADD (attention deficit disorder)    Allergy    seasonal flares of perinneal allergies   Cancer ([place])    melanoma   Cardiac arrest ([place]) 07/1997   aborted   Dementia ([place])    post resusitative   Depression    Diverticulosis    Family hx of prostate cancer    GERD (gastroesophageal reflux disease)    Headache(784.0)    Hyperlipidemia    Hypothyroidism    ICD (implantable cardiac defibrillator) in place    Idiopathic urticaria    Internal hemorrhoids    Low back pain syndrome    Melanoma in situ of skin of trunk ([place])    chest   PVC (premature ventricular contraction)    associated with cardiac arrest   Sleep apnea    no c-pap   Tubular adenoma of colon 10/2010    Past Surgical History:  Procedure Laterality Date    implantation x2     CARDIAC DEFIBRILLATOR PLACEMENT  07/01/2010   Explanation of a previously implanted device, pocket revision, and insertion of a new device, and intraoperative defibrillation threshold testing Caryl Comes)   CARDIAC DEFIBRILLATOR REMOVAL  2004   Defibrillator change  Caryl Comes) 3 times changed   CATARACT EXTRACTION W/PHACO Left 02/04/2021   Procedure: CATARACT EXTRACTION PHACO AND INTRAOCULAR LENS PLACEMENT ([place]) LEFT VIVITY toric LENS 9.21 01:11.6;  Surgeon: Leandrew Koyanagi, MD;  Location: [facility];  Service: Ophthalmology;  Laterality: Left;  keep patient at 11:30 arrival   CATARACT EXTRACTION W/PHACO Right 03/04/2021   Procedure: CATARACT EXTRACTION PHACO AND INTRAOCULAR LENS PLACEMENT ([place]) RIGHT VIVITY LENS 8.59 01:15.3;  Surgeon: Leandrew Koyanagi, MD;  Location: [facility];  Service: Ophthalmology;  Laterality: Right;   HEMORRHOID SURGERY     ICD     TONSILLECTOMY      There were no vitals filed for this visit.   Subjective Assessment - 05/06/21 1309     Subjective Patient reports having a good weekend  and no new complaints today. Denies any falls and pain.    Patient is accompained by: Family member    Pertinent History Per note submitted by Dr. Krista Blue on 12/15/2020- The patient has an underlying medical history of cardiac death from ventricular fibrillation with hypoxic brain injury in Jan 1999 due to arrythmia, 15 minutes without heart beat,  status post defibrillator placement, He stayed in hospital for 35 days. On 12/16/2020-Gait abnormality, Also related to the previous anoxic brain injury, deconditioning,    Limitations  Walking;House hold activities    How long can you sit comfortably? no limitations    How long can you stand comfortably? Not really limited    How long can you walk comfortably? 15-20    Patient Stated Goals To improve transfers, balance and walking.    Currently in Pain? No/denies             Interventions:   Neuro re-ed: in // bars:   Step up onto purple pad  Alt LE x 15 reps each without UE support  Dynamic high knee marching while standing on purple pad without UE support   Dynamic lunge squat- (start with both feet on purple pad then lunge forward onto 6" step with 1 LE- perform lunge  squat then push leg back onto purple pad.   Side step up and onto purple pad then lateral side step up onto 6" step then one more lateral side step to floor- then back to left x 10 reps each direction without UE support- mild difficulty with coordination of task- required more VC to perform tasks correctly.    Static minisquat- hold- start with standing then lower into:  1) minimal squat- hold 10 sec 2) 1/2 from stand to chair height x 10 sec 3) just above chair height x 10 sec  2 sets performed with VC for correct technique and patient reported very fatigued at end of session.  Education provided throughout session via VC/TC and demonstration to facilitate movement at target joints and correct muscle activation for all  exercises performed.      Clinical Impression: Patient presents today with good motivation and participated well overall today. He utilized a Geologist, engineering to help maintain good working posture and did improve with practice and visual demo for coordination of activity and required less constant VC today. He was fatigued upon completion of static squatting today and will benefit from skilled physical therapy intervention in order to improve his LE strength, endurance, balance, and to reduce his risk and frequency of falls                    PT Education - 05/06/21 1310     Education Details Exercise technique    Person(s) Educated Patient    Methods Explanation;Demonstration;Tactile cues;Verbal cues    Comprehension Verbalized understanding;Returned demonstration;Verbal cues required;Need further instruction              PT Short Term Goals - 03/18/21 1306       PT SHORT TERM GOAL #1   Title Pt will be independent with HEP in order to improve strength and balance in order to decrease fall risk and improve function at home and work.    Baseline 01/27/2021- Patient presents with no formal HEP in place. 03/18/2021- Patient is knowledgeable of HEP- WIfe reports  he is not always compliant but not because I don't know what to do."    Time 6    Period Weeks    Status Achieved    Target Date 03/10/21               PT Long Term Goals - 05/06/21 1317       PT LONG TERM GOAL #1   Title Pt will improve BERG by at least 5 points in order to demonstrate clinically significant improvement in balance.    Baseline 01/27/2021= 43/56; 03/18/2021=50/56    Time 12    Period Weeks    Status Achieved  PT LONG TERM GOAL #2   Title Pt will improve FOTO to target score of 55% to display perceived improvements in ability to complete ADL's.    Baseline 01/27/2021= 42%; 04/22/2021- Will assess next visit; 05/06/2021= 64%    Time 12    Period Weeks    Status Achieved      PT LONG TERM GOAL #3   Title Pt will decrease 5TSTS by at least 3 seconds in order to demonstrate clinically significant improvement in LE strength.    Baseline 01/27/2021= 30.07 without UE support; 03/18/2021= 24.78 sec without UE Support; 04/22/2021= 14.47 sec without UE support    Time 12    Period Weeks    Status Achieved      PT LONG TERM GOAL #4   Title Pt will decrease TUG to below 15  seconds/decrease in order to demonstrate decreased fall risk.    Baseline 01/27/2021= 20.97 without an AD; 03/18/2021= 19.2 sec without AD; 04/22/2021= 17 sec without AD    Time 12    Period Weeks    Status On-going      PT LONG TERM GOAL #5   Title Pt will increase 10MWT by at least 0.13 m/s in order to demonstrate clinically significant improvement in community ambulation.    Baseline 01/27/2021= 0.67 m/s without an AD.  9/14= 0.75 m/s; 04/22/2021= 0.82 sec without an AD- WIll keep goal active to ensure patient can perform consistently.    Time 12    Period Weeks    Status Partially Met      PT LONG TERM GOAL #6   Title Pt will increase 6MWT by at least 63m (160ft) in order to demonstrate clinically significant improvement in cardiopulmonary endurance and community ambulation    Baseline  02/03/2021= 1100 feet; 03/18/21= 1125 feet without AD.  04/22/2021= 1175 feet without AD    Time 11    Period Weeks    Status On-going                   Plan - 05/06/21 1311     Clinical Impression Statement Patient presents today with good motivation and participated well overall today. He utilized a Geologist, engineering to help maintain good working posture and did improve with practice and visual demo for coordination of activity and required less constant VC today. He was fatigued upon completion of static squatting today and will benefit from skilled physical therapy intervention in order to improve his LE strength, endurance, balance, and to reduce his risk and frequency of falls    Personal Factors and Comorbidities Comorbidity 3+    Comorbidities Anoxic brain injury (1999), Cardiac (pacemaker/debrillator), HTN    Examination-Activity Limitations Bathing;Caring for Others;Lift;Stairs;Transfers    Examination-Participation Restrictions Community Activity;Yard Work    Stability/Clinical Decision Making Stable/Uncomplicated    Rehab Potential Good    PT Frequency 1x / week    PT Duration 12 weeks    PT Treatment/Interventions ADLs/Self Care Home Management;Cryotherapy;Moist Heat;DME Instruction;Gait training;Stair training;Functional mobility training;Therapeutic activities;Therapeutic exercise;Balance training;Neuromuscular re-education;Patient/family education;Manual techniques;Passive range of motion;Dry needling;Canalith Repostioning;Ultrasound;Vestibular;Joint Manipulations    PT Next Visit Plan Progress LE strengthening next visit and continue with  balance exercises.    PT Home Exercise Plan No changes this visit    Consulted and Agree with Plan of Care Patient;Family member/caregiver    Family Member Consulted Wife- [name]             Patient will benefit from skilled therapeutic intervention in order to improve the following  deficits and impairments:  Abnormal gait, Decreased  activity tolerance, Decreased balance, Decreased cognition, Decreased coordination, Decreased endurance, Decreased mobility, Decreased strength, Difficulty walking  Visit Diagnosis: Abnormality of gait and mobility  Difficulty in walking, not elsewhere classified  Muscle weakness (generalized)  Unsteadiness on feet     Problem List Patient Active Problem List   Diagnosis Date Noted   Gait abnormality 12/16/2020   Nonintractable headache 09/30/2020   Tinnitus 09/09/2019   History of sudden cardiac arrest 01/09/2019   Viral syndrome 09/06/2018   Seizures ([place]) 03/02/2018   Syncope 12/26/2017   Other social stressor 12/26/2017   Health care maintenance 08/14/2017   Family hx of prostate cancer    Allergy    Insomnia 10/20/2015   Advance care planning 03/08/2014   Memory loss 09/14/2013   Hypoxic brain injury ([place]) 09/14/2013   Medicare annual wellness visit, subsequent 02/16/2013   Shortness of breath 08/10/2011   Implantable cardioverter-defibrillator (ICD) in situ 02/03/2009   Hypothyroidism 11/18/2008   CONTACT DERMATITIS&OTHER ECZEMA DUE TO PLANTS 02/03/2008   IDIOPATHIC URTICARIA 02/03/2008   Hypertension 02/03/2008   HLD (hyperlipidemia) 11/15/07   Attention deficit disorder 15-Nov-2007   PVC/VT non sustained 11/15/2007   Sleep apnea Nov 15, 2007   HEADACHE 11/15/07   SUDDEN DEATH-aborted Nov 15, 2007   LOW BACK PAIN SYNDROME 10/11/2007    Lewis Moccasin, PT 05/06/2021, 3:24 PM  [place] MAIN [facility] SERVICES [address] [place], [place], [address] Phone: [phone]   Fax:  [phone]  Name: ADREAN HEITZ MRN: [id number] Date of Birth: 11-27-1950

## 2021-05-07 ENCOUNTER — Ambulatory Visit: Payer: Medicare Other

## 2021-05-11 ENCOUNTER — Ambulatory Visit: Payer: Medicare Other

## 2021-05-13 ENCOUNTER — Ambulatory Visit: Payer: Medicare Other

## 2021-05-13 ENCOUNTER — Other Ambulatory Visit: Payer: Self-pay

## 2021-05-13 DIAGNOSIS — R269 Unspecified abnormalities of gait and mobility: Secondary | ICD-10-CM

## 2021-05-13 DIAGNOSIS — M6281 Muscle weakness (generalized): Secondary | ICD-10-CM | POA: Diagnosis not present

## 2021-05-13 DIAGNOSIS — R2681 Unsteadiness on feet: Secondary | ICD-10-CM

## 2021-05-13 DIAGNOSIS — R262 Difficulty in walking, not elsewhere classified: Secondary | ICD-10-CM | POA: Diagnosis not present

## 2021-05-13 NOTE — Therapy (Signed)
[place] MAIN [facility] SERVICES [address] [place], [place], [address] Phone: [phone]   Fax:  [phone]  Physical Therapy Treatment/Physical Therapy Progress Note   Dates of reporting period  03/18/2021   to  05/13/2021  Patient Details  Name: Larry [name] MRN: [id number] Date of Birth: 01/29/1951 Referring Provider (PT): Dr. Krista Blue   Encounter Date: 05/13/2021   PT End of Session - 05/13/21 1313     Visit Number 20    Number of Visits 29    Date for PT Re-Evaluation 07/15/21    Authorization Type UHC Medicare    Authorization Time Period 01/27/2021- 85/08/7739; Recert 28/78/6767- 20/94/7096    PT Start Time 1303    PT Stop Time 1344    PT Time Calculation (min) 41 min    Equipment Utilized During Treatment Gait belt    Activity Tolerance Patient tolerated treatment well;No increased pain    Behavior During Therapy WFL for tasks assessed/performed             Past Medical History:  Diagnosis Date   ADD (attention deficit disorder)    Allergy    seasonal flares of perinneal allergies   Cancer ([place])    melanoma   Cardiac arrest ([place]) 07/1997   aborted   Dementia ([place])    post resusitative   Depression    Diverticulosis    Family hx of prostate cancer    GERD (gastroesophageal reflux disease)    Headache(784.0)    Hyperlipidemia    Hypothyroidism    ICD (implantable cardiac defibrillator) in place    Idiopathic urticaria    Internal hemorrhoids    Low back pain syndrome    Melanoma in situ of skin of trunk ([place])    chest   PVC (premature ventricular contraction)    associated with cardiac arrest   Sleep apnea    no c-pap   Tubular adenoma of colon 10/2010    Past Surgical History:  Procedure Laterality Date    implantation x2     CARDIAC DEFIBRILLATOR PLACEMENT  07/01/2010   Explanation of a previously implanted device, pocket revision, and insertion of a new device, and intraoperative defibrillation  threshold testing Caryl Comes)   CARDIAC DEFIBRILLATOR REMOVAL  2004   Defibrillator change Caryl Comes) 3 times changed   CATARACT EXTRACTION W/PHACO Left 02/04/2021   Procedure: CATARACT EXTRACTION PHACO AND INTRAOCULAR LENS PLACEMENT ([place]) LEFT VIVITY toric LENS 9.21 01:11.6;  Surgeon: Leandrew Koyanagi, MD;  Location: [place];  Service: Ophthalmology;  Laterality: Left;  keep patient at 11:30 arrival   CATARACT EXTRACTION W/PHACO Right 03/04/2021   Procedure: CATARACT EXTRACTION PHACO AND INTRAOCULAR LENS PLACEMENT ([place]) RIGHT VIVITY LENS 8.59 01:15.3;  Surgeon: Leandrew Koyanagi, MD;  Location: [place];  Service: Ophthalmology;  Laterality: Right;   HEMORRHOID SURGERY     ICD     TONSILLECTOMY      There were no vitals filed for this visit.   Subjective Assessment - 05/13/21 1309     Subjective Patient reports having more right neck today,    Patient is accompained by: Family member    Pertinent History Per note submitted by Dr. Krista Blue on 12/15/2020- The patient has an underlying medical history of cardiac death from ventricular fibrillation with hypoxic brain injury in Jan 1999 due to arrythmia, 15 minutes without heart beat,  status post defibrillator placement, He stayed in hospital for 35 days. On 12/16/2020-Gait abnormality, Also related to the previous anoxic brain  injury, deconditioning,    Limitations Walking;House hold activities    How long can you sit comfortably? no limitations    How long can you stand comfortably? Not really limited    How long can you walk comfortably? 15-20    Patient Stated Goals To improve transfers, balance and walking.    Currently in Pain? Yes    Pain Score 6     Pain Location Neck    Pain Descriptors / Indicators Aching    Pain Type Chronic pain    Pain Onset More than a month ago    Pain Frequency Intermittent    Aggravating Factors  Looking to left or right.    Pain Relieving Factors Rest    Effect of Pain on Daily Activities  Difficulty with turning my head    Multiple Pain Sites No              INTERVENTIONS:   Reassessed LTGs for progress note (see goal section for details)   Cervical ROM:   Rotation= L/R= 22/32 Sidebending L/R= 18/22  Manual therapy to C-spine: Passive stretching to L/R rotation and sidebending x 10 min      Clinical Impression: Patient presents today with increased c/o neck pain and very limited ROM but did improve with manual therapy and moist heat. He also continues to demo steady progress toward functional mobility including improved gait speed (measured in the 10 MWT) and improved Timed Up and go indicating a clinically significant decrease risk of falling versus initial score. Patient's condition has the potential to improve in response to therapy. Maximum improvement is yet to be obtained. The anticipated improvement is attainable and reasonable in a generally predictable time.                      PT Education - 05/13/21 1312     Education Details Exercise technique    Person(s) Educated Patient    Methods Explanation;Demonstration;Tactile cues    Comprehension Verbalized understanding;Returned demonstration;Verbal cues required;Need further instruction              PT Short Term Goals - 03/18/21 1306       PT SHORT TERM GOAL #1   Title Pt will be independent with HEP in order to improve strength and balance in order to decrease fall risk and improve function at home and work.    Baseline 01/27/2021- Patient presents with no formal HEP in place. 03/18/2021- Patient is knowledgeable of HEP- WIfe reports he is not always compliant but not because I don't know what to do."    Time 6    Period Weeks    Status Achieved    Target Date 03/10/21               PT Long Term Goals - 05/13/21 1318       PT LONG TERM GOAL #1   Title Pt will improve BERG by at least 5 points in order to demonstrate clinically significant improvement in balance.     Baseline 01/27/2021= 43/56; 03/18/2021=50/56    Time 12    Period Weeks    Status Achieved      PT LONG TERM GOAL #2   Title Pt will improve FOTO to target score of 55% to display perceived improvements in ability to complete ADL's.    Baseline 01/27/2021= 42%; 04/22/2021- Will assess next visit; 05/06/2021= 64%    Time 12    Period Weeks    Status Achieved  PT LONG TERM GOAL #3   Title Pt will decrease 5TSTS by at least 3 seconds in order to demonstrate clinically significant improvement in LE strength.    Baseline 01/27/2021= 30.07 without UE support; 03/18/2021= 24.78 sec without UE Support; 04/22/2021= 14.47 sec without UE support    Time 12    Period Weeks    Status Achieved      PT LONG TERM GOAL #4   Title Pt will decrease TUG to below 15  seconds/decrease in order to demonstrate decreased fall risk.    Baseline 01/27/2021= 20.97 without an AD; 03/18/2021= 19.2 sec without AD; 04/22/2021= 17 sec without AD; 05/13/2021= 14.88 sec    Time 12    Period Weeks    Status On-going    Target Date 07/15/21      PT LONG TERM GOAL #5   Title Pt will increase 10MWT by at least 0.13 m/s in order to demonstrate clinically significant improvement in community ambulation.    Baseline 01/27/2021= 0.67 m/s without an AD.  9/14= 0.75 m/s; 04/22/2021= 0.82 sec without an AD- WIll keep goal active to ensure patient can perform consistently. 05/13/2021= 0.89 m/s    Time 12    Period Weeks    Status Partially Met    Target Date 07/15/21      PT LONG TERM GOAL #6   Title Pt will increase 6MWT by at least 68m(1639f in order to demonstrate clinically significant improvement in cardiopulmonary endurance and community ambulation    Baseline 02/03/2021= 1100 feet; 03/18/21= 1125 feet without AD.  04/22/2021= 1175 feet without AD    Time 11    Period Weeks    Status On-going    Target Date 07/15/21                   Plan - 05/13/21 1306     Clinical Impression Statement Patient presents  today with increased c/o neck pain and very limited ROM but did improve with manual therapy and moist heat. He also continues to demo steady progress toward functional mobility including improved gait speed (measured in the 10 MWT) and improved Timed Up and go indicating a clinically significant decrease risk of falling versus initial score. Patient's condition has the potential to improve in response to therapy. Maximum improvement is yet to be obtained. The anticipated improvement is attainable and reasonable in a generally predictable time.    Personal Factors and Comorbidities Comorbidity 3+    Comorbidities Anoxic brain injury (1999), Cardiac (pacemaker/debrillator), HTN    Examination-Activity Limitations Bathing;Caring for Others;Lift;Stairs;Transfers    Examination-Participation Restrictions Community Activity;Yard Work    Stability/Clinical Decision Making Stable/Uncomplicated    Rehab Potential Good    PT Frequency 1x / week    PT Duration 12 weeks    PT Treatment/Interventions ADLs/Self Care Home Management;Cryotherapy;Moist Heat;DME Instruction;Gait training;Stair training;Functional mobility training;Therapeutic activities;Therapeutic exercise;Balance training;Neuromuscular re-education;Patient/family education;Manual techniques;Passive range of motion;Dry needling;Canalith Repostioning;Ultrasound;Vestibular;Joint Manipulations    PT Next Visit Plan Progress LE strengthening next visit and continue with  balance exercises.    PT Home Exercise Plan No changes this visit    Consulted and Agree with Plan of Care Patient;Family member/caregiver    Family Member Consulted Wife- KaJuliann Pulse           Patient will benefit from skilled therapeutic intervention in order to improve the following deficits and impairments:  Abnormal gait, Decreased activity tolerance, Decreased balance, Decreased cognition, Decreased coordination, Decreased endurance, Decreased mobility, Decreased strength,  Difficulty walking  Visit Diagnosis: Abnormality of gait and mobility  Difficulty in walking, not elsewhere classified  Unsteadiness on feet  Muscle weakness (generalized)     Problem List Patient Active Problem List   Diagnosis Date Noted   Gait abnormality 12/16/2020   Nonintractable headache 09/30/2020   Tinnitus 09/09/2019   History of sudden cardiac arrest 01/09/2019   Viral syndrome 09/06/2018   Seizures ([place]) 03/02/2018   Syncope 12/26/2017   Other social stressor 12/26/2017   Health care maintenance 08/14/2017   Family hx of prostate cancer    Allergy    Insomnia 10/20/2015   Advance care planning 03/08/2014   Memory loss 09/14/2013   Hypoxic brain injury ([place]) 09/14/2013   Medicare annual wellness visit, subsequent 02/16/2013   Shortness of breath 08/10/2011   Implantable cardioverter-defibrillator (ICD) in situ 02/03/2009   Hypothyroidism 11/18/2008   CONTACT DERMATITIS&OTHER ECZEMA DUE TO PLANTS 02/03/2008   IDIOPATHIC URTICARIA 02/03/2008   Hypertension 02/03/2008   HLD (hyperlipidemia) Nov 14, 2007   Attention deficit disorder Nov 14, 2007   PVC/VT non sustained 11-14-2007   Sleep apnea Nov 14, 2007   HEADACHE 11/14/07   SUDDEN DEATH-aborted 11-14-2007   LOW BACK PAIN SYNDROME 10/11/2007    Larry [name], PT 05/14/2021, 9:37 AM  [place] MAIN [facility] SERVICES [address] [place], [place], [address] Phone: [phone]   Fax:  [phone]  Name: Larry [name] MRN: [id number] Date of Birth: [birth year]

## 2021-05-14 ENCOUNTER — Ambulatory Visit: Payer: Medicare Other

## 2021-05-18 ENCOUNTER — Ambulatory Visit: Payer: Medicare Other

## 2021-05-18 ENCOUNTER — Other Ambulatory Visit: Payer: Self-pay

## 2021-05-18 DIAGNOSIS — R269 Unspecified abnormalities of gait and mobility: Secondary | ICD-10-CM

## 2021-05-18 DIAGNOSIS — M6281 Muscle weakness (generalized): Secondary | ICD-10-CM | POA: Diagnosis not present

## 2021-05-18 DIAGNOSIS — R2681 Unsteadiness on feet: Secondary | ICD-10-CM | POA: Diagnosis not present

## 2021-05-18 DIAGNOSIS — R262 Difficulty in walking, not elsewhere classified: Secondary | ICD-10-CM

## 2021-05-18 NOTE — Therapy (Signed)
[place] MAIN [facility] SERVICES [address] [place], [place], [address] Phone: [phone]   Fax:  [phone]  Physical Therapy Treatment  Patient Details  Name: Larry [name] MRN: [id number] Date of Birth: 21-Jul-1950 Referring Provider (PT): Dr. Krista Blue   Encounter Date: 05/18/2021   PT End of Session - 05/18/21 1511     Visit Number 21    Number of Visits 29    Date for PT Re-Evaluation 07/15/21    Authorization Type UHC Medicare    Authorization Time Period 01/27/2021- 93/71/6967; Recert 89/38/1017- 51/08/5850    PT Start Time 1346    PT Stop Time 1429    PT Time Calculation (min) 43 min    Equipment Utilized During Treatment Gait belt    Activity Tolerance Patient tolerated treatment well;No increased pain    Behavior During Therapy WFL for tasks assessed/performed             Past Medical History:  Diagnosis Date   ADD (attention deficit disorder)    Allergy    seasonal flares of perinneal allergies   Cancer ([place])    melanoma   Cardiac arrest ([place]) 07/1997   aborted   Dementia ([place])    post resusitative   Depression    Diverticulosis    Family hx of prostate cancer    GERD (gastroesophageal reflux disease)    Headache(784.0)    Hyperlipidemia    Hypothyroidism    ICD (implantable cardiac defibrillator) in place    Idiopathic urticaria    Internal hemorrhoids    Low back pain syndrome    Melanoma in situ of skin of trunk ([place])    chest   PVC (premature ventricular contraction)    associated with cardiac arrest   Sleep apnea    no c-pap   Tubular adenoma of colon 10/2010    Past Surgical History:  Procedure Laterality Date    implantation x2     CARDIAC DEFIBRILLATOR PLACEMENT  07/01/2010   Explanation of a previously implanted device, pocket revision, and insertion of a new device, and intraoperative defibrillation threshold testing Caryl Comes)   CARDIAC DEFIBRILLATOR REMOVAL  2004   Defibrillator change  Caryl Comes) 3 times changed   CATARACT EXTRACTION W/PHACO Left 02/04/2021   Procedure: CATARACT EXTRACTION PHACO AND INTRAOCULAR LENS PLACEMENT ([place]) LEFT VIVITY toric LENS 9.21 01:11.6;  Surgeon: Leandrew Koyanagi, MD;  Location: [place];  Service: Ophthalmology;  Laterality: Left;  keep patient at 11:30 arrival   CATARACT EXTRACTION W/PHACO Right 03/04/2021   Procedure: CATARACT EXTRACTION PHACO AND INTRAOCULAR LENS PLACEMENT ([place]) RIGHT VIVITY LENS 8.59 01:15.3;  Surgeon: Leandrew Koyanagi, MD;  Location: [place];  Service: Ophthalmology;  Laterality: Right;   HEMORRHOID SURGERY     ICD     TONSILLECTOMY      There were no vitals filed for this visit.   Subjective Assessment - 05/18/21 1405     Subjective Patient reports neck is stiff but he has been trying his exercises - requires his wife for performing with correct technique.    Patient is accompained by: Family member    Pertinent History Per note submitted by Dr. Krista Blue on 12/15/2020- The patient has an underlying medical history of cardiac death from ventricular fibrillation with hypoxic brain injury in Jan 1999 due to arrythmia, 15 minutes without heart beat,  status post defibrillator placement, He stayed in hospital for 35 days. On 12/16/2020-Gait abnormality, Also related to the previous anoxic brain injury, deconditioning,  Limitations Walking;House hold activities    How long can you sit comfortably? no limitations    How long can you stand comfortably? Not really limited    How long can you walk comfortably? 15-20    Patient Stated Goals To improve transfers, balance and walking.    Currently in Pain? Yes    Pain Score 4     Pain Location Neck    Pain Orientation Right;Posterior    Pain Descriptors / Indicators Aching    Pain Type Chronic pain    Pain Onset More than a month ago    Pain Frequency Intermittent              INTERVENTIONS:   Therapeutic exercises:   C-spine ROM activities:  Review of HEP-  Sidebending with Opp arm holding onto chair x 20 sec x 4 sets each side.  Rotation-Using towel for more AAROM- x 20 sec hold x 3 each way.   Neuromuscular re-education:   Side step - trunk rotate - clap x 10 each direction- Mild difficulty with coordinating tasks- improved with increased reps.    Ladder drills- Step forward-x length of ladder x 4 - Able to walk step through pattern.  Ladder drill- side step (both feet into square) x length of ladder x 4- initially more narrow steps - able to widen steps with practice.  Ladder drill- combo of forward/backward/side- VC for sequencing  Forward gait with trunk twist- handing ball to SPT (standing behind patient) and rotating left to right while trying to simultaneously walk forward. X 30 feet total- Patient exhibiting difficulty with sequencing.   Side step with ball toss x 30 feet x 4 Side step with ball bounce to SPT x 30 feet x 2.  Education provided throughout session via VC/TC and demonstration to facilitate movement at target joints and correct muscle activation for all testing and exercises performed.    Clinical Impression: Patient requires mult-modal cues to perform cervical stretches for good follow through. He has difficulty grasping coordination of all activities and did improve with increased reps/practice. He was fatigued at end of session but did demo improvement with each activity with VC and practice. Pt will benefit from skilled physical therapy to increase mobility and strength to improve patient's quality of life                     PT Education - 05/18/21 1458     Education Details Exercise technique    Person(s) Educated Patient    Methods Explanation;Demonstration;Tactile cues;Verbal cues    Comprehension Verbalized understanding;Returned demonstration;Verbal cues required;Tactile cues required;Need further instruction              PT Short Term Goals - 03/18/21 1306       PT  SHORT TERM GOAL #1   Title Pt will be independent with HEP in order to improve strength and balance in order to decrease fall risk and improve function at home and work.    Baseline 01/27/2021- Patient presents with no formal HEP in place. 03/18/2021- Patient is knowledgeable of HEP- WIfe reports he is not always compliant but not because I don't know what to do."    Time 6    Period Weeks    Status Achieved    Target Date 03/10/21               PT Long Term Goals - 05/13/21 1318       PT LONG TERM GOAL #1  Title Pt will improve BERG by at least 5 points in order to demonstrate clinically significant improvement in balance.    Baseline 01/27/2021= 43/56; 03/18/2021=50/56    Time 12    Period Weeks    Status Achieved      PT LONG TERM GOAL #2   Title Pt will improve FOTO to target score of 55% to display perceived improvements in ability to complete ADL's.    Baseline 01/27/2021= 42%; 04/22/2021- Will assess next visit; 05/06/2021= 64%    Time 12    Period Weeks    Status Achieved      PT LONG TERM GOAL #3   Title Pt will decrease 5TSTS by at least 3 seconds in order to demonstrate clinically significant improvement in LE strength.    Baseline 01/27/2021= 30.07 without UE support; 03/18/2021= 24.78 sec without UE Support; 04/22/2021= 14.47 sec without UE support    Time 12    Period Weeks    Status Achieved      PT LONG TERM GOAL #4   Title Pt will decrease TUG to below 15  seconds/decrease in order to demonstrate decreased fall risk.    Baseline 01/27/2021= 20.97 without an AD; 03/18/2021= 19.2 sec without AD; 04/22/2021= 17 sec without AD; 05/13/2021= 14.88 sec    Time 12    Period Weeks    Status On-going    Target Date 07/15/21      PT LONG TERM GOAL #5   Title Pt will increase 10MWT by at least 0.13 m/s in order to demonstrate clinically significant improvement in community ambulation.    Baseline 01/27/2021= 0.67 m/s without an AD.  9/14= 0.75 m/s; 04/22/2021= 0.82 sec  without an AD- WIll keep goal active to ensure patient can perform consistently. 05/13/2021= 0.89 m/s    Time 12    Period Weeks    Status Partially Met    Target Date 07/15/21      PT LONG TERM GOAL #6   Title Pt will increase 6MWT by at least 24m(1640f in order to demonstrate clinically significant improvement in cardiopulmonary endurance and community ambulation    Baseline 02/03/2021= 1100 feet; 03/18/21= 1125 feet without AD.  04/22/2021= 1175 feet without AD    Time 11    Period Weeks    Status On-going    Target Date 07/15/21                   Plan - 05/18/21 1457     Clinical Impression Statement Patient requires mult-modal cues to perform cervical stretches for good follow through. He has difficulty grasping coordination of all activities and did improve with increased reps/practice. He was fatigued at end of session but did demo improvement with each activity with VC and practice. Pt will benefit from skilled physical therapy to increase mobility and strength to improve patient's quality of life    Personal Factors and Comorbidities Comorbidity 3+    Comorbidities Anoxic brain injury (1999), Cardiac (pacemaker/debrillator), HTN    Examination-Activity Limitations Bathing;Caring for Others;Lift;Stairs;Transfers    Examination-Participation Restrictions Community Activity;Yard Work    Stability/Clinical Decision Making Stable/Uncomplicated    Rehab Potential Good    PT Frequency 1x / week    PT Duration 12 weeks    PT Treatment/Interventions ADLs/Self Care Home Management;Cryotherapy;Moist Heat;DME Instruction;Gait training;Stair training;Functional mobility training;Therapeutic activities;Therapeutic exercise;Balance training;Neuromuscular re-education;Patient/family education;Manual techniques;Passive range of motion;Dry needling;Canalith Repostioning;Ultrasound;Vestibular;Joint Manipulations    PT Next Visit Plan Progress LE strengthening next visit and continue with  balance exercises.    PT Home Exercise Plan No changes this visit    Consulted and Agree with Plan of Care Patient;Family member/caregiver    Family Member Consulted Wife- [name]             Patient will benefit from skilled therapeutic intervention in order to improve the following deficits and impairments:  Abnormal gait, Decreased activity tolerance, Decreased balance, Decreased cognition, Decreased coordination, Decreased endurance, Decreased mobility, Decreased strength, Difficulty walking  Visit Diagnosis: Abnormality of gait and mobility  Difficulty in walking, not elsewhere classified  Muscle weakness (generalized)  Unsteadiness on feet     Problem List Patient Active Problem List   Diagnosis Date Noted   Gait abnormality 12/16/2020   Nonintractable headache 09/30/2020   Tinnitus 09/09/2019   History of sudden cardiac arrest 01/09/2019   Viral syndrome 09/06/2018   Seizures ([place]) 03/02/2018   Syncope 12/26/2017   Other social stressor 12/26/2017   Health care maintenance 08/14/2017   Family hx of prostate cancer    Allergy    Insomnia 10/20/2015   Advance care planning 03/08/2014   Memory loss 09/14/2013   Hypoxic brain injury ([place]) 09/14/2013   Medicare annual wellness visit, subsequent 02/16/2013   Shortness of breath 08/10/2011   Implantable cardioverter-defibrillator (ICD) in situ 02/03/2009   Hypothyroidism 11/18/2008   CONTACT DERMATITIS&OTHER ECZEMA DUE TO PLANTS 02/03/2008   IDIOPATHIC URTICARIA 02/03/2008   Hypertension 02/03/2008   HLD (hyperlipidemia) 11-23-2007   Attention deficit disorder Nov 23, 2007   PVC/VT non sustained Nov 23, 2007   Sleep apnea 23-Nov-2007   HEADACHE Nov 23, 2007   SUDDEN DEATH-aborted 11/23/2007   LOW BACK PAIN SYNDROME 10/11/2007    Lewis Moccasin, PT 05/19/2021, 10:51 AM  [place] MAIN [facility] SERVICES [address] [place], [place], [address] Phone: [phone]   Fax:   [phone]  Name: Larry [name] MRN: [id number] Date of Birth: 25-Dec-1950

## 2021-05-18 NOTE — Progress Notes (Signed)
[place] Duty Excuse Letter

## 2021-05-20 ENCOUNTER — Ambulatory Visit: Payer: Medicare Other

## 2021-05-21 ENCOUNTER — Ambulatory Visit: Payer: Medicare Other

## 2021-05-25 ENCOUNTER — Ambulatory Visit: Payer: Medicare Other

## 2021-05-27 ENCOUNTER — Ambulatory Visit: Payer: Medicare Other

## 2021-06-01 ENCOUNTER — Ambulatory Visit: Payer: Medicare Other

## 2021-06-01 ENCOUNTER — Other Ambulatory Visit: Payer: Self-pay

## 2021-06-01 ENCOUNTER — Other Ambulatory Visit: Payer: Self-pay | Admitting: Cardiovascular Disease

## 2021-06-01 DIAGNOSIS — R262 Difficulty in walking, not elsewhere classified: Secondary | ICD-10-CM

## 2021-06-01 DIAGNOSIS — R2681 Unsteadiness on feet: Secondary | ICD-10-CM

## 2021-06-01 DIAGNOSIS — R269 Unspecified abnormalities of gait and mobility: Secondary | ICD-10-CM

## 2021-06-01 DIAGNOSIS — M6281 Muscle weakness (generalized): Secondary | ICD-10-CM | POA: Diagnosis not present

## 2021-06-01 NOTE — Therapy (Signed)
[place] MAIN [facility] SERVICES [address] [place], [place], [address] Phone: [phone]   Fax:  [phone]  Physical Therapy Treatment  Patient Details  Name: Larry [name] MRN: [id number] Date of Birth: 1950/10/23 Referring Provider (PT): Dr. Krista Blue   Encounter Date: 06/01/2021   PT End of Session - 06/01/21 1311     Visit Number 22    Number of Visits 29    Date for PT Re-Evaluation 07/15/21    Authorization Type UHC Medicare    Authorization Time Period 01/27/2021- 03/47/4259; Recert 56/38/7564- 33/29/5188    PT Start Time 1300    PT Stop Time 1343    PT Time Calculation (min) 43 min    Equipment Utilized During Treatment Gait belt    Activity Tolerance Patient tolerated treatment well;No increased pain    Behavior During Therapy WFL for tasks assessed/performed             Past Medical History:  Diagnosis Date   ADD (attention deficit disorder)    Allergy    seasonal flares of perinneal allergies   Cancer ([place])    melanoma   Cardiac arrest ([place]) 07/1997   aborted   Dementia ([place])    post resusitative   Depression    Diverticulosis    Family hx of prostate cancer    GERD (gastroesophageal reflux disease)    Headache(784.0)    Hyperlipidemia    Hypothyroidism    ICD (implantable cardiac defibrillator) in place    Idiopathic urticaria    Internal hemorrhoids    Low back pain syndrome    Melanoma in situ of skin of trunk ([place])    chest   PVC (premature ventricular contraction)    associated with cardiac arrest   Sleep apnea    no c-pap   Tubular adenoma of colon 10/2010    Past Surgical History:  Procedure Laterality Date    implantation x2     CARDIAC DEFIBRILLATOR PLACEMENT  07/01/2010   Explanation of a previously implanted device, pocket revision, and insertion of a new device, and intraoperative defibrillation threshold testing Caryl Comes)   CARDIAC DEFIBRILLATOR REMOVAL  2004   Defibrillator change  Caryl Comes) 3 times changed   CATARACT EXTRACTION W/PHACO Left 02/04/2021   Procedure: CATARACT EXTRACTION PHACO AND INTRAOCULAR LENS PLACEMENT ([place]) LEFT VIVITY toric LENS 9.21 01:11.6;  Surgeon: Leandrew Koyanagi, MD;  Location: [place];  Service: Ophthalmology;  Laterality: Left;  keep patient at 11:30 arrival   CATARACT EXTRACTION W/PHACO Right 03/04/2021   Procedure: CATARACT EXTRACTION PHACO AND INTRAOCULAR LENS PLACEMENT ([place]) RIGHT VIVITY LENS 8.59 01:15.3;  Surgeon: Leandrew Koyanagi, MD;  Location: [place];  Service: Ophthalmology;  Laterality: Right;   HEMORRHOID SURGERY     ICD     TONSILLECTOMY      There were no vitals filed for this visit.   Subjective Assessment - 06/01/21 1304     Subjective Patient reports doing okay but just had an "ice pick" headache prior to starting session.    Patient is accompained by: Family member    Pertinent History Per note submitted by Dr. Krista Blue on 12/15/2020- The patient has an underlying medical history of cardiac death from ventricular fibrillation with hypoxic brain injury in Jan 1999 due to arrythmia, 15 minutes without heart beat,  status post defibrillator placement, He stayed in hospital for 35 days. On 12/16/2020-Gait abnormality, Also related to the previous anoxic brain injury, deconditioning,    Limitations Walking;House hold  activities    How long can you sit comfortably? no limitations    How long can you stand comfortably? Not really limited    How long can you walk comfortably? 15-20    Patient Stated Goals To improve transfers, balance and walking.    Currently in Pain? No/denies    Pain Onset More than a month ago             INTERVENTIONS:   Neuromuscular Re-education:    Forward/backward stepping over 1/2 foam and No UE support x 15 reps- Mild difficulty coodinating tasks initially yet after 5-6 reps more automatic with good cadence.    Side step up and over 1/2 foam without UE Support x 15  reps  Static stand on airex pad - dynamic tapping onto color hedgehog without UE support. PT would call out color and patient would tap corresponding hedgehog- CGA For safety and occasional undsteadiness.   Dual task activity 1) step tap onto 6 in block (from airex pad) = 37 sec alternating LE's x 10 2) called out 13 animals in 37 sec 3) dual task of both- 50 sec to complete 10 step tap alt and called out 11 animals  1) step tap onto 6 in block (from airex pad) = 33 sec alternating LE's x 10 2) count backward by 3 from 66 down to 48 (7) in 33 sec 3) Dual task- Patient required over a min to perform 10 reps and only called out 6  animals in that time.   Dynamic standing on airex pad with eyes closed and calling out favorite candy bars- Patient with some difficulty with addition of cognitive tasks yet no loss of balance.   Standing on airex pad with dynamic head turns with eyes open- Patient limited with ROM due to chronic neck pain but no dizziness and no LOB.   Education provided throughout session via VC/TC and demonstration to facilitate movement at target joints and correct muscle activation for all testing and exercises performed.    Clinical assessment: Patient arrived with good motivation today and performed well overall with all balance activities with minimal unsteadiness. He was however challenged with coordination activities requiring dual tasks- he performed well with physical task but struggled when a cognitive task was added. He was easily frustrated  but did maintain his balance throughout session today. Pt will benefit from skilled physical therapy to increase mobility and strength to improve patient's quality of life                     PT Education - 06/01/21 1311     Education Details Exercise technique    Person(s) Educated Patient    Methods Explanation;Demonstration;Tactile cues;Verbal cues    Comprehension Verbalized understanding;Returned  demonstration;Verbal cues required;Need further instruction;Tactile cues required              PT Short Term Goals - 03/18/21 1306       PT SHORT TERM GOAL #1   Title Pt will be independent with HEP in order to improve strength and balance in order to decrease fall risk and improve function at home and work.    Baseline 01/27/2021- Patient presents with no formal HEP in place. 03/18/2021- Patient is knowledgeable of HEP- WIfe reports he is not always compliant but not because I don't know what to do."    Time 6    Period Weeks    Status Achieved    Target Date 03/10/21  PT Long Term Goals - 05/13/21 1318       PT LONG TERM GOAL #1   Title Pt will improve BERG by at least 5 points in order to demonstrate clinically significant improvement in balance.    Baseline 01/27/2021= 43/56; 03/18/2021=50/56    Time 12    Period Weeks    Status Achieved      PT LONG TERM GOAL #2   Title Pt will improve FOTO to target score of 55% to display perceived improvements in ability to complete ADL's.    Baseline 01/27/2021= 42%; 04/22/2021- Will assess next visit; 05/06/2021= 64%    Time 12    Period Weeks    Status Achieved      PT LONG TERM GOAL #3   Title Pt will decrease 5TSTS by at least 3 seconds in order to demonstrate clinically significant improvement in LE strength.    Baseline 01/27/2021= 30.07 without UE support; 03/18/2021= 24.78 sec without UE Support; 04/22/2021= 14.47 sec without UE support    Time 12    Period Weeks    Status Achieved      PT LONG TERM GOAL #4   Title Pt will decrease TUG to below 15  seconds/decrease in order to demonstrate decreased fall risk.    Baseline 01/27/2021= 20.97 without an AD; 03/18/2021= 19.2 sec without AD; 04/22/2021= 17 sec without AD; 05/13/2021= 14.88 sec    Time 12    Period Weeks    Status On-going    Target Date 07/15/21      PT LONG TERM GOAL #5   Title Pt will increase 10MWT by at least 0.13 m/s in order to demonstrate  clinically significant improvement in community ambulation.    Baseline 01/27/2021= 0.67 m/s without an AD.  9/14= 0.75 m/s; 04/22/2021= 0.82 sec without an AD- WIll keep goal active to ensure patient can perform consistently. 05/13/2021= 0.89 m/s    Time 12    Period Weeks    Status Partially Met    Target Date 07/15/21      PT LONG TERM GOAL #6   Title Pt will increase 6MWT by at least 41m(1650f in order to demonstrate clinically significant improvement in cardiopulmonary endurance and community ambulation    Baseline 02/03/2021= 1100 feet; 03/18/21= 1125 feet without AD.  04/22/2021= 1175 feet without AD    Time 11    Period Weeks    Status On-going    Target Date 07/15/21                   Plan - 06/01/21 1312     Clinical Impression Statement Patient arrived with good motivation today and performed well overall with all balance activities with minimal unsteadiness. He was however challenged with coordination activities requiring dual tasks- he performed well with physical task but struggled when a cognitive task was added. He was easily frustrated  but did maintain his balance throughout session today. Pt will benefit from skilled physical therapy to increase mobility and strength to improve patient's quality of life    Personal Factors and Comorbidities Comorbidity 3+    Comorbidities Anoxic brain injury (1999), Cardiac (pacemaker/debrillator), HTN    Examination-Activity Limitations Bathing;Caring for Others;Lift;Stairs;Transfers    Examination-Participation Restrictions Community Activity;Yard Work    Stability/Clinical Decision Making Stable/Uncomplicated    Rehab Potential Good    PT Frequency 1x / week    PT Duration 12 weeks    PT Treatment/Interventions ADLs/Self Care Home Management;Cryotherapy;Moist Heat;DME Instruction;Gait training;Stair training;Functional  mobility training;Therapeutic activities;Therapeutic exercise;Balance training;Neuromuscular  re-education;Patient/family education;Manual techniques;Passive range of motion;Dry needling;Canalith Repostioning;Ultrasound;Vestibular;Joint Manipulations    PT Next Visit Plan Progress LE strengthening next visit and continue with  balance exercises.    PT Home Exercise Plan No changes this visit    Consulted and Agree with Plan of Care Patient;Family member/caregiver    Family Member Consulted Wife- [name]             Patient will benefit from skilled therapeutic intervention in order to improve the following deficits and impairments:  Abnormal gait, Decreased activity tolerance, Decreased balance, Decreased cognition, Decreased coordination, Decreased endurance, Decreased mobility, Decreased strength, Difficulty walking  Visit Diagnosis: Abnormality of gait and mobility  Difficulty in walking, not elsewhere classified  Muscle weakness (generalized)  Unsteadiness on feet     Problem List Patient Active Problem List   Diagnosis Date Noted   Gait abnormality 12/16/2020   Nonintractable headache 09/30/2020   Tinnitus 09/09/2019   History of sudden cardiac arrest 01/09/2019   Viral syndrome 09/06/2018   Seizures ([place]) 03/02/2018   Syncope 12/26/2017   Other social stressor 12/26/2017   Health care maintenance 08/14/2017   Family hx of prostate cancer    Allergy    Insomnia 10/20/2015   Advance care planning 03/08/2014   Memory loss 09/14/2013   Hypoxic brain injury ([place]) 09/14/2013   Medicare annual wellness visit, subsequent 02/16/2013   Shortness of breath 08/10/2011   Implantable cardioverter-defibrillator (ICD) in situ 02/03/2009   Hypothyroidism 11/18/2008   CONTACT DERMATITIS&OTHER ECZEMA DUE TO PLANTS 02/03/2008   IDIOPATHIC URTICARIA 02/03/2008   Hypertension 02/03/2008   HLD (hyperlipidemia) 11/07/2007   Attention deficit disorder 11-07-2007   PVC/VT non sustained Nov 07, 2007   Sleep apnea 11-07-2007   HEADACHE 2007/11/07   SUDDEN DEATH-aborted  11-07-2007   LOW BACK PAIN SYNDROME 10/11/2007    Lewis Moccasin, PT 06/02/2021, 8:28 AM  [place] MAIN [facility] SERVICES [address] [place], [place], [address] Phone: [phone]   Fax:  [phone]  Name: Larry [name] MRN: [id number] Date of Birth: 02-24-1951

## 2021-06-03 ENCOUNTER — Ambulatory Visit: Payer: Medicare Other

## 2021-06-06 NOTE — Progress Notes (Signed)
Chart reviewed, agree above plan ?

## 2021-06-08 ENCOUNTER — Ambulatory Visit: Payer: Medicare Other | Attending: Neurology

## 2021-06-08 ENCOUNTER — Other Ambulatory Visit: Payer: Self-pay

## 2021-06-08 DIAGNOSIS — R262 Difficulty in walking, not elsewhere classified: Secondary | ICD-10-CM | POA: Insufficient documentation

## 2021-06-08 DIAGNOSIS — M6281 Muscle weakness (generalized): Secondary | ICD-10-CM | POA: Diagnosis not present

## 2021-06-08 DIAGNOSIS — R269 Unspecified abnormalities of gait and mobility: Secondary | ICD-10-CM | POA: Insufficient documentation

## 2021-06-08 DIAGNOSIS — R2681 Unsteadiness on feet: Secondary | ICD-10-CM | POA: Diagnosis not present

## 2021-06-08 DIAGNOSIS — R278 Other lack of coordination: Secondary | ICD-10-CM | POA: Insufficient documentation

## 2021-06-08 NOTE — Therapy (Addendum)
[place] MAIN [facility] SERVICES [address] [place], [place], [address] Phone: [phone]   Fax:  [phone]  Physical Therapy Treatment  Patient Details  Name: Larry [name] MRN: [id number] Date of Birth: 1950/11/19 Referring Provider (PT): Dr. Krista Blue   Encounter Date: 06/08/2021   PT End of Session - 06/08/21 1351     Visit Number 23    Number of Visits 29    Date for PT Re-Evaluation 07/15/21    Authorization Type UHC Medicare    Authorization Time Period 01/27/2021- 09/32/6712; Recert 45/80/9983- 38/25/0539    PT Start Time 1300    PT Stop Time 1344    PT Time Calculation (min) 44 min    Equipment Utilized During Treatment Gait belt    Activity Tolerance Patient tolerated treatment well    Behavior During Therapy [facility] for tasks assessed/performed             Past Medical History:  Diagnosis Date   ADD (attention deficit disorder)    Allergy    seasonal flares of perinneal allergies   Cancer ([place])    melanoma   Cardiac arrest ([place]) 07/1997   aborted   Dementia ([place])    post resusitative   Depression    Diverticulosis    Family hx of prostate cancer    GERD (gastroesophageal reflux disease)    Headache(784.0)    Hyperlipidemia    Hypothyroidism    ICD (implantable cardiac defibrillator) in place    Idiopathic urticaria    Internal hemorrhoids    Low back pain syndrome    Melanoma in situ of skin of trunk ([place])    chest   PVC (premature ventricular contraction)    associated with cardiac arrest   Sleep apnea    no c-pap   Tubular adenoma of colon 10/2010    Past Surgical History:  Procedure Laterality Date    implantation x2     CARDIAC DEFIBRILLATOR PLACEMENT  07/01/2010   Explanation of a previously implanted device, pocket revision, and insertion of a new device, and intraoperative defibrillation threshold testing Caryl Comes)   CARDIAC DEFIBRILLATOR REMOVAL  2004   Defibrillator change Caryl Comes) 3 times changed    CATARACT EXTRACTION W/PHACO Left 02/04/2021   Procedure: CATARACT EXTRACTION PHACO AND INTRAOCULAR LENS PLACEMENT ([place]) LEFT VIVITY toric LENS 9.21 01:11.6;  Surgeon: Leandrew Koyanagi, MD;  Location: [place];  Service: Ophthalmology;  Laterality: Left;  keep patient at 11:30 arrival   CATARACT EXTRACTION W/PHACO Right 03/04/2021   Procedure: CATARACT EXTRACTION PHACO AND INTRAOCULAR LENS PLACEMENT ([place]) RIGHT VIVITY LENS 8.59 01:15.3;  Surgeon: Leandrew Koyanagi, MD;  Location: [place];  Service: Ophthalmology;  Laterality: Right;   HEMORRHOID SURGERY     ICD     TONSILLECTOMY      There were no vitals filed for this visit.   Subjective Assessment - 06/08/21 1258     Subjective Patient reports he is doing his stretches at home and is starting to notice an improvement in range. He reports no pain today and no new stumbles or falls.    Patient is accompained by: Family member    Pertinent History Per note submitted by Dr. Krista Blue on 12/15/2020- The patient has an underlying medical history of cardiac death from ventricular fibrillation with hypoxic brain injury in Jan 1999 due to arrythmia, 15 minutes without heart beat,  status post defibrillator placement, He stayed in hospital for 35 days. On 12/16/2020-Gait abnormality, Also related to  the previous anoxic brain injury, deconditioning,    Limitations Walking;House hold activities    How long can you sit comfortably? no limitations    How long can you stand comfortably? Not really limited    How long can you walk comfortably? 15-20    Patient Stated Goals To improve transfers, balance and walking.    Currently in Pain? No/denies    Pain Onset More than a month ago                INTERVENTIONS:    Neuromuscular re-education: All interventions today with CGA unless otherwise specified   Resisted gait - backward on Matrix cable machine x 2 lengths with 12.5 lbs. VC on controlling the step when stepping  toward the machine. - Progressed to added cognitive task of counting backwards from 100, x 2 lengths with 12.5 lbs.  Resisted gait - forward on Matrix cable machine x 2 lengths with 12.5 lbs. VC on controlling the step when stepping toward the machine. - Progressed to added cognitive task of counting backwards from 100, x 2 lengths with 12.5 lbs.  Resisted gait - Side stepping on Matrix cable machine x 2 lengths with 12.5 lbs. VC on controlling the step when stepping toward the machine. - Progressed to added task of ball toss with PT x 1 length with 12.5 lbs - Progressed to added task of counting backwards from 50 and ball toss with PT x 1 length with 12.5 lbs.  On airex at support surface:  NBOS 2 x 30 sec, pt displays minimal sway and doesn't require UE support  NBOS with horizontal head turns 2 x 30 sec, pt cervical range limited. VC to only turn head in nonpainful range  NBOS with vertical head turns 2 x 30 sec  Semi-tandem stance with horizontal head turns 2 x 60 sec each side, pt cervical range limited. VC to only turn head in nonpainful range  Semi-tandem stance with vertical head turns 2 x 60 sec each side  Semi-tandem stance with added motor task to reach for a cone from SPT on one side and pass it to PT on the other x multiple reps each side. VC to reach with opposite arm to increase rotational component.  Ladder drills- Reciprocal forward stepping x 4 lengths - Able to walk step through pattern with mild sway and no LOB   Forward ambulation with VC on sequencing RLE step with LUE arm swing x multiple trials. Pt initially very challenged by sequencing, displaying multiple LOB requiring step strategy and Min A from SPT. Exercise initially completed in agility ladder, but regressed to an open area on the floor. Pt improved with continued cuing and demonstration.  Education provided throughout session via VC/TC and demonstration to facilitate movement at target joints and correct  muscle activation for all testing and exercises performed.        PT Education - 06/08/21 1350     Education Details exercise technique    Person(s) Educated Patient    Methods Explanation;Demonstration;Tactile cues;Verbal cues    Comprehension Verbalized understanding;Returned demonstration;Tactile cues required;Verbal cues required;Need further instruction              PT Short Term Goals - 03/18/21 1306       PT SHORT TERM GOAL #1   Title Pt will be independent with HEP in order to improve strength and balance in order to decrease fall risk and improve function at home and work.    Baseline 01/27/2021- Patient  presents with no formal HEP in place. 03/18/2021- Patient is knowledgeable of HEP- WIfe reports he is not always compliant but not because I don't know what to do."    Time 6    Period Weeks    Status Achieved    Target Date 03/10/21               PT Long Term Goals - 05/13/21 1318       PT LONG TERM GOAL #1   Title Pt will improve BERG by at least 5 points in order to demonstrate clinically significant improvement in balance.    Baseline 01/27/2021= 43/56; 03/18/2021=50/56    Time 12    Period Weeks    Status Achieved      PT LONG TERM GOAL #2   Title Pt will improve FOTO to target score of 55% to display perceived improvements in ability to complete ADL's.    Baseline 01/27/2021= 42%; 04/22/2021- Will assess next visit; 05/06/2021= 64%    Time 12    Period Weeks    Status Achieved      PT LONG TERM GOAL #3   Title Pt will decrease 5TSTS by at least 3 seconds in order to demonstrate clinically significant improvement in LE strength.    Baseline 01/27/2021= 30.07 without UE support; 03/18/2021= 24.78 sec without UE Support; 04/22/2021= 14.47 sec without UE support    Time 12    Period Weeks    Status Achieved      PT LONG TERM GOAL #4   Title Pt will decrease TUG to below 15  seconds/decrease in order to demonstrate decreased fall risk.    Baseline  01/27/2021= 20.97 without an AD; 03/18/2021= 19.2 sec without AD; 04/22/2021= 17 sec without AD; 05/13/2021= 14.88 sec    Time 12    Period Weeks    Status On-going    Target Date 07/15/21      PT LONG TERM GOAL #5   Title Pt will increase 10MWT by at least 0.13 m/s in order to demonstrate clinically significant improvement in community ambulation.    Baseline 01/27/2021= 0.67 m/s without an AD.  9/14= 0.75 m/s; 04/22/2021= 0.82 sec without an AD- WIll keep goal active to ensure patient can perform consistently. 05/13/2021= 0.89 m/s    Time 12    Period Weeks    Status Partially Met    Target Date 07/15/21      PT LONG TERM GOAL #6   Title Pt will increase 6MWT by at least 33m(1686f in order to demonstrate clinically significant improvement in cardiopulmonary endurance and community ambulation    Baseline 02/03/2021= 1100 feet; 03/18/21= 1125 feet without AD.  04/22/2021= 1175 feet without AD    Time 11    Period Weeks    Status On-going    Target Date 07/15/21                   Plan - 06/08/21 1352     Clinical Impression Statement Pt arrived with great motivation for all interventions today. Pt tolerated all balance exercises well and showed very little unsteadiness. He was challenged by added dual-task components of exercises, and displayed increased unsteadiness when presented with cognitive tasks. Pt will benefit from skilled physical therapy to increase mobility and strength to improve patient's quality of life    Personal Factors and Comorbidities Comorbidity 3+    Comorbidities Anoxic brain injury (1999), Cardiac (pacemaker/debrillator), HTN    Examination-Activity Limitations Bathing;Caring for Others;Lift;Stairs;Transfers  Examination-Participation Restrictions Community Activity;Yard Work    Stability/Clinical Decision Making Stable/Uncomplicated    Rehab Potential Good    PT Frequency 1x / week    PT Duration 12 weeks    PT Treatment/Interventions ADLs/Self Care  Home Management;Cryotherapy;Moist Heat;DME Instruction;Gait training;Stair training;Functional mobility training;Therapeutic activities;Therapeutic exercise;Balance training;Neuromuscular re-education;Patient/family education;Manual techniques;Passive range of motion;Dry needling;Canalith Repostioning;Ultrasound;Vestibular;Joint Manipulations    PT Next Visit Plan Progress LE strengthening next visit and continue with  balance exercises.    PT Home Exercise Plan No changes this visit    Consulted and Agree with Plan of Care Patient;Family member/caregiver    Family Member Consulted Wife- [name]             Patient will benefit from skilled therapeutic intervention in order to improve the following deficits and impairments:  Abnormal gait, Decreased activity tolerance, Decreased balance, Decreased cognition, Decreased coordination, Decreased endurance, Decreased mobility, Decreased strength, Difficulty walking  Visit Diagnosis: Unsteadiness on feet  Other lack of coordination  Difficulty in walking, not elsewhere classified     Problem List Patient Active Problem List   Diagnosis Date Noted   Gait abnormality 12/16/2020   Nonintractable headache 09/30/2020   Tinnitus 09/09/2019   History of sudden cardiac arrest 01/09/2019   Viral syndrome 09/06/2018   Seizures ([place]) 03/02/2018   Syncope 12/26/2017   Other social stressor 12/26/2017   Health care maintenance 08/14/2017   Family hx of prostate cancer    Allergy    Insomnia 10/20/2015   Advance care planning 03/08/2014   Memory loss 09/14/2013   Hypoxic brain injury ([place]) 09/14/2013   Medicare annual wellness visit, subsequent 02/16/2013   Shortness of breath 08/10/2011   Implantable cardioverter-defibrillator (ICD) in situ 02/03/2009   Hypothyroidism 11/18/2008   CONTACT DERMATITIS&OTHER ECZEMA DUE TO PLANTS 02/03/2008   IDIOPATHIC URTICARIA 02/03/2008   Hypertension 02/03/2008   HLD (hyperlipidemia) Nov 05, 2007    Attention deficit disorder Nov 05, 2007   PVC/VT non sustained 11-05-07   Sleep apnea 2007-11-05   HEADACHE 11-05-07   SUDDEN DEATH-aborted 05-Nov-2007   LOW BACK PAIN SYNDROME 10/11/2007    Karn Cassis, SPT This entire session was performed under direct supervision and direction of a licensed therapist/therapist assistant . I have personally read, edited and approve of the note as written.   Ollen Bowl, PT 06/08/2021, 2:18 PM  [place] MAIN [facility] SERVICES [address] [place], [place], [address] Phone: [phone]   Fax:  [phone]  Name: BELFORD PASCUCCI MRN: [id number] Date of Birth: 1950-07-26

## 2021-06-10 ENCOUNTER — Ambulatory Visit: Payer: Medicare Other | Admitting: Cardiovascular Disease

## 2021-06-10 ENCOUNTER — Ambulatory Visit: Payer: Medicare Other

## 2021-06-10 ENCOUNTER — Encounter: Payer: Self-pay | Admitting: Cardiovascular Disease

## 2021-06-10 ENCOUNTER — Other Ambulatory Visit: Payer: Self-pay

## 2021-06-10 VITALS — BP 130/96 | HR 101 | Ht 68.0 in | Wt 184.6 lb

## 2021-06-10 DIAGNOSIS — I10 Essential (primary) hypertension: Secondary | ICD-10-CM

## 2021-06-10 DIAGNOSIS — Z8674 Personal history of sudden cardiac arrest: Secondary | ICD-10-CM | POA: Diagnosis not present

## 2021-06-10 NOTE — Progress Notes (Signed)
Cardiology Office Note:    Date:  06/10/2021   ID:  Larry [name], DOB 11/30/1950, MRN [id number]  PCP:  Larry Ghent, MD   [facility] Providers Cardiologist:  Larry Mocha, MD Electrophysiologist:  Larry Axe, MD     Referring MD: Larry Ghent, MD   Chief Complaint  Patient presents with   Follow-up    Hx of cardiac arrest      History of Present Illness:    Larry [name] is a 70 y.o. male with a hx of ventricular fibrillation with hypoxic brain injury in 1999.  He underwent ICD implantation.  He was found to have normal coronary arteries at the time of his initial event.  He has had normal LV function by echo assessment.  The patient is here with his wife today.  I have not seen him since 2019 but he has been seen regularly by Dr. Caryl [name]  He is doing well with no specific complaints. Today, he denies symptoms of palpitations, chest pain, shortness of breath, orthopnea, PND, lower extremity edema, dizziness, or syncope.   Past Medical History:  Diagnosis Date   ADD (attention deficit disorder)    Allergy    seasonal flares of perinneal allergies   Cancer ([place])    melanoma   Cardiac arrest ([place]) 07/1997   aborted   Dementia ([place])    post resusitative   Depression    Diverticulosis    Family hx of prostate cancer    GERD (gastroesophageal reflux disease)    Headache(784.0)    Hyperlipidemia    Hypothyroidism    ICD (implantable cardiac defibrillator) in place    Idiopathic urticaria    Internal hemorrhoids    Low back pain syndrome    Melanoma in situ of skin of trunk ([place])    chest   PVC (premature ventricular contraction)    associated with cardiac arrest   Sleep apnea    no c-pap   Tubular adenoma of colon 10/2010    Past Surgical History:  Procedure Laterality Date    implantation x2     CARDIAC DEFIBRILLATOR PLACEMENT  07/01/2010   Explanation of a previously implanted device, pocket revision, and insertion of a new device, and  intraoperative defibrillation threshold testing Larry [name])   CARDIAC DEFIBRILLATOR REMOVAL  2004   Defibrillator change Larry [name]) 3 times changed   CATARACT EXTRACTION W/PHACO Left 02/04/2021   Procedure: CATARACT EXTRACTION PHACO AND INTRAOCULAR LENS PLACEMENT ([place]) LEFT VIVITY toric LENS 9.21 01:11.6;  Surgeon: Larry Koyanagi, MD;  Location: [place];  Service: Ophthalmology;  Laterality: Left;  keep patient at 11:30 arrival   CATARACT EXTRACTION W/PHACO Right 03/04/2021   Procedure: CATARACT EXTRACTION PHACO AND INTRAOCULAR LENS PLACEMENT ([place]) RIGHT VIVITY LENS 8.59 01:15.3;  Surgeon: Larry Koyanagi, MD;  Location: [place];  Service: Ophthalmology;  Laterality: Right;   HEMORRHOID SURGERY     ICD     TONSILLECTOMY      Current Medications: Current Meds  Medication Sig   amphetamine-dextroamphetamine (ADDERALL) 10 MG tablet Take 1 tablet (10 mg total) by mouth 2 (two) times daily.   Ascorbic Acid (VITAMIN C) 500 MG tablet Take 500 mg by mouth daily.   aspirin 81 MG tablet Take 81 mg by mouth daily.   Cholecalciferol (VITAMIN D3) 10 MCG (400 UNIT) tablet Take 400 Units by mouth daily.   diltiazem (CARDIZEM CD) 120 MG 24 hr capsule TAKE 1 CAPSULE BY MOUTH  DAILY   donepezil (ARICEPT) 10  MG tablet TAKE 1 TABLET BY MOUTH  DAILY   fexofenadine (ALLEGRA) 180 MG tablet Take 180 mg by mouth daily.   Ginkgo Biloba 40 MG TABS Take by mouth as directed.   lamoTRIgine (LAMICTAL) 100 MG tablet TAKE 1 TABLET BY MOUTH  TWICE DAILY   levothyroxine (SYNTHROID) 137 MCG tablet Take 1 tablet (137 mcg total) by mouth daily before breakfast.   Melatonin 5 MG CAPS Take by mouth as directed.   memantine (NAMENDA) 10 MG tablet TAKE 1 TABLET BY MOUTH  TWICE DAILY   metoprolol succinate (TOPROL-XL) 25 MG 24 hr tablet TAKE 1 TABLET BY MOUTH  DAILY   montelukast (SINGULAIR) 10 MG tablet TAKE 1 TABLET BY MOUTH  DAILY   Multiple Vitamin (MULTIVITAMIN) tablet Take 700 tablets by mouth  daily.   NON FORMULARY Focus factor 1 tab po bid   rosuvastatin (CRESTOR) 20 MG tablet Take 1 tablet (20 mg total) by mouth at bedtime.   vitamin B-12 (CYANOCOBALAMIN) 1000 MCG tablet Take 1,000 mcg by mouth daily.   vitamin E 180 MG (400 UNITS) capsule Take 400 Units by mouth daily.     Allergies:   Penicillins   Social History   Socioeconomic History   Marital status: Married    Spouse name: Larry [name]   Number of children: 2   Years of education: College   Highest education level: Not on file  Occupational History    Employer: DISABLED  Tobacco Use   Smoking status: Former    Packs/day: 1.00    Years: 10.00    Pack years: 10.00    Types: Cigarettes    Quit date: 07/06/1995    Years since quitting: 25.9   Smokeless tobacco: Former    Types: Chew    Quit date: 11/04/1994  Vaping Use   Vaping Use: Never used  Substance and Sexual Activity   Alcohol use: No    Alcohol/week: 0.0 standard drinks    Comment: socially   Drug use: No   Sexual activity: Not on file  Other Topics Concern   Not on file  Social History Narrative   Patient is married Larry [name]) [phone] with 2 daughters   5 grandchildren   Daily caffeine use - one cup of coffee and one-two cokes per day   Patient is right-handed.   Patient has a college education   Social Determinants of Radio broadcast assistant Strain: Low Risk    Difficulty of Paying Living Expenses: Not hard at all  Food Insecurity: No Food Insecurity   Worried About Charity fundraiser in the Last Year: Never true   Arboriculturist in the Last Year: Never true  Transportation Needs: No Transportation Needs   Lack of Transportation (Medical): No   Lack of Transportation (Non-Medical): No  Physical Activity: Inactive   Days of Exercise per Week: 0 days   Minutes of Exercise per Session: 0 min  Stress: No Stress Concern Present   Feeling of Stress : Not at all  Social Connections: Not on file     Family History: The patient's family  history includes Alopecia in his father; Cancer in his paternal grandfather; Coronary artery disease in his father; Diabetes in his brother; Diverticulosis in his father; Heart disease in his brother and father; Hypertension in his mother; Lung cancer in his paternal grandfather; Prostate cancer in his brother. There is no history of Colon cancer.  ROS:   Please see the history of present illness.  All other systems reviewed and are negative.  EKGs/Labs/Other Studies Reviewed:     Recent Labs: 10/03/2020: ALT 30; BUN 13; Creatinine, Ser 1.08; Hemoglobin 14.9; Platelets 337.0; Potassium 4.1; Sodium 141 12/08/2020: TSH 5.94  Recent Lipid Panel    Component Value Date/Time   CHOL 207 (H) 10/03/2020 0804   TRIG 129.0 10/03/2020 0804   HDL 62.00 10/03/2020 0804   CHOLHDL 3 10/03/2020 0804   VLDL 25.8 10/03/2020 0804   LDLCALC 119 (H) 10/03/2020 0804   LDLDIRECT 127.6 03/05/2014 0841     Risk Assessment/Calculations:           Physical Exam:    VS:  BP (!) 130/96   [name] (!) 101   Ht 5\' 8"  (1.727 m)   Wt 184 lb 9.6 oz (83.7 kg)   SpO2 97%   BMI 28.07 kg/m     Wt Readings from Last 3 Encounters:  06/10/21 184 lb 9.6 oz (83.7 kg)  03/31/21 182 lb (82.6 kg)  03/04/21 178 lb (80.7 kg)     GEN:  Well nourished, well developed in no acute distress HEENT: Normal NECK: No JVD; No carotid bruits LYMPHATICS: No lymphadenopathy CARDIAC: RRR, no murmurs, rubs, gallops RESPIRATORY:  Clear to auscultation without rales, wheezing or rhonchi  ABDOMEN: Soft, non-tender, non-distended MUSCULOSKELETAL:  No edema; No deformity  SKIN: Warm and dry NEUROLOGIC:  Alert and oriented x 3 PSYCHIATRIC:  Normal affect   ASSESSMENT:    1. Essential hypertension   2. History of sudden cardiac arrest    PLAN:    In order of problems listed above:  Today's blood pressure is elevated.  He does not routinely check his blood pressure at home.  He does have a blood pressure cuff and I have gone  over the technique with measuring home blood pressure with the patient and his wife.  I would like him to check his blood pressure 1 to 2 days/week and record the readings.  If his readings are greater than 140/90 he will contact us.  He will continue on diltiazem and metoprolol succinate.  I suspect this is related to whitecoat hypertension and anxiety.  His [name] rate has improved significantly on my check compared to when he came in. Patient status post ICD.  Followed by Dr. Caryl [name]  Overall the patient seems to be doing quite well.  I did not make any changes in his medications today.  He will follow his home blood pressure and notify us if readings are elevated.  I will see him back in 1 year for follow-up.      Medication Adjustments/Labs and Tests Ordered: Current medicines are reviewed at length with the patient today.  Concerns regarding medicines are outlined above.  No orders of the defined types were placed in this encounter.  No orders of the defined types were placed in this encounter.   Patient Instructions  Dr. Burt Knack would like for you to check your blood pressure several times per week and call us if your readings are >140/90  Medication Instructions:  Your physician recommends that you continue on your current medications as directed. Please refer to the Current Medication list given to you today.  *If you need a refill on your cardiac medications before your next appointment, please call your pharmacy*  Follow-Up: At [facility], you and your health needs are our priority.  As part of our continuing mission to provide you with exceptional heart care, we have created designated Provider Care Teams.  These  Care Teams include your primary Cardiologist (physician) and Advanced Practice Providers (APPs -  Physician Assistants and Nurse Practitioners) who all work together to provide you with the care you need, when you need it.  Your next appointment:   1 year(s)  The  format for your next appointment:   In Person  Provider:   Sherren Mocha, MD      Signed, Larry Mocha, MD  06/10/2021 1:27 PM    [facility]

## 2021-06-10 NOTE — Patient Instructions (Signed)
Dr. Burt Knack would like for you to check your blood pressure several times per week and call us if your readings are >140/90  Medication Instructions:  Your physician recommends that you continue on your current medications as directed. Please refer to the Current Medication list given to you today.  *If you need a refill on your cardiac medications before your next appointment, please call your pharmacy*  Follow-Up: At [facility], you and your health needs are our priority.  As part of our continuing mission to provide you with exceptional heart care, we have created designated Provider Care Teams.  These Care Teams include your primary Cardiologist (physician) and Advanced Practice Providers (APPs -  Physician Assistants and Nurse Practitioners) who all work together to provide you with the care you need, when you need it.  Your next appointment:   1 year(s)  The format for your next appointment:   In Person  Provider:   Sherren Mocha, MD

## 2021-06-15 ENCOUNTER — Ambulatory Visit: Payer: Medicare Other

## 2021-06-15 ENCOUNTER — Other Ambulatory Visit: Payer: Self-pay

## 2021-06-15 DIAGNOSIS — M6281 Muscle weakness (generalized): Secondary | ICD-10-CM | POA: Diagnosis not present

## 2021-06-15 DIAGNOSIS — R2681 Unsteadiness on feet: Secondary | ICD-10-CM

## 2021-06-15 DIAGNOSIS — R278 Other lack of coordination: Secondary | ICD-10-CM | POA: Diagnosis not present

## 2021-06-15 DIAGNOSIS — R269 Unspecified abnormalities of gait and mobility: Secondary | ICD-10-CM | POA: Diagnosis not present

## 2021-06-15 DIAGNOSIS — R262 Difficulty in walking, not elsewhere classified: Secondary | ICD-10-CM | POA: Diagnosis not present

## 2021-06-15 NOTE — Therapy (Signed)
[place] MAIN [facility] SERVICES [address] [place], [place], [address] Phone: [phone]   Fax:  [phone]  Physical Therapy Treatment  Patient Details  Name: Larry [name] MRN: [id number] Date of Birth: 10-Jul-1950 Referring Provider (PT): Dr. Krista Blue   Encounter Date: 06/15/2021   PT End of Session - 06/15/21 1348     Visit Number 24    Number of Visits 29    Date for PT Re-Evaluation 07/15/21    Authorization Type UHC Medicare    Authorization Time Period 01/27/2021- 43/15/4008; Recert 67/61/9509- 32/67/1245    PT Start Time 1301    PT Stop Time 1344    PT Time Calculation (min) 43 min    Equipment Utilized During Treatment Gait belt    Activity Tolerance Patient tolerated treatment well    Behavior During Therapy WFL for tasks assessed/performed             Past Medical History:  Diagnosis Date   ADD (attention deficit disorder)    Allergy    seasonal flares of perinneal allergies   Cancer ([place])    melanoma   Cardiac arrest ([place]) 07/1997   aborted   Dementia ([place])    post resusitative   Depression    Diverticulosis    Family hx of prostate cancer    GERD (gastroesophageal reflux disease)    Headache(784.0)    Hyperlipidemia    Hypothyroidism    ICD (implantable cardiac defibrillator) in place    Idiopathic urticaria    Internal hemorrhoids    Low back pain syndrome    Melanoma in situ of skin of trunk ([place])    chest   PVC (premature ventricular contraction)    associated with cardiac arrest   Sleep apnea    no c-pap   Tubular adenoma of colon 10/2010    Past Surgical History:  Procedure Laterality Date    implantation x2     CARDIAC DEFIBRILLATOR PLACEMENT  07/01/2010   Explanation of a previously implanted device, pocket revision, and insertion of a new device, and intraoperative defibrillation threshold testing Caryl Comes)   CARDIAC DEFIBRILLATOR REMOVAL  2004   Defibrillator change Caryl Comes) 3 times  changed   CATARACT EXTRACTION W/PHACO Left 02/04/2021   Procedure: CATARACT EXTRACTION PHACO AND INTRAOCULAR LENS PLACEMENT ([place]) LEFT VIVITY toric LENS 9.21 01:11.6;  Surgeon: Leandrew Koyanagi, MD;  Location: [place];  Service: Ophthalmology;  Laterality: Left;  keep patient at 11:30 arrival   CATARACT EXTRACTION W/PHACO Right 03/04/2021   Procedure: CATARACT EXTRACTION PHACO AND INTRAOCULAR LENS PLACEMENT ([place]) RIGHT VIVITY LENS 8.59 01:15.3;  Surgeon: Leandrew Koyanagi, MD;  Location: [place];  Service: Ophthalmology;  Laterality: Right;   HEMORRHOID SURGERY     ICD     TONSILLECTOMY      There were no vitals filed for this visit.   Subjective Assessment - 06/15/21 1302     Subjective Patient reports he's been "real good" and had a busy weekend. Pt reports he hasn't had any falls but describes a few stumbles as if his toes are catching. Pt denies any pain.    Patient is accompained by: Family member    Pertinent History Per note submitted by Dr. Krista Blue on 12/15/2020- The patient has an underlying medical history of cardiac death from ventricular fibrillation with hypoxic brain injury in Jan 1999 due to arrythmia, 15 minutes without heart beat,  status post defibrillator placement, He stayed in hospital for 35 days. On 12/16/2020-Gait  abnormality, Also related to the previous anoxic brain injury, deconditioning,    Limitations Walking;House hold activities    How long can you sit comfortably? no limitations    How long can you stand comfortably? Not really limited    How long can you walk comfortably? 15-20    Patient Stated Goals To improve transfers, balance and walking.    Pain Onset More than a month ago                  INTERVENTIONS:   Pt educated on cervical rotation stretch with a towel across the chin. Pt cued to hold stretch in a pain free range - 2 x 30 sec each side.  Neuromuscular re-education: All interventions today with CGA unless  otherwise specified    On airex at support surface:   NBOS 2 x 30 sec, pt displays minimal sway and doesn't require UE support NBOS with horizontal head turns 2 x 30 sec, pt cervical range limited. VC to only turn head in nonpainful range Semi-tandem stance with cues to spell his name with letters on the white board x 1 trial. Added cognitive task and motor task, pt displays no LOB even when reaching outside BOS. - Additional trial - pt cued to find certain letters on the whiteboard x 10 letters. Pt displays good visual scanning and displays no LOB    At support surface:  Step ups onto 6" step x 12 reps each LE to improve foot clearance. Pt requires no UE support and displays minimal sway.  In open area in PT gym:  Ladder drills- Reciprocal forward stepping x 4 lengths - Able to walk step through pattern with mild sway and no LOB  Ladder drills - side stepping x 5 lengths - VC for taking a big first step to leave room for trailing leg. Pt displays one brief LOB but recovers with step strategy.   Ladder drills - step combination including forward, backward, and side stepping x 5 lengths - VC required when learning sequence, but cues gradually diminished as pt improved.  - Progressed to stepping back onto yoga mat to provide change in surface.  Stepping transition from floor onto yoga mat with focus on foot clearance 2 x 10 ft. VC for increased dorsiflexion to improve clearance. Pt displays hesitation when stepping onto the mat, but no LOB.  Forward ambulation with VC on sequencing RLE step with LUE arm swing x multiple trials. Pt initially challenged by sequencing. Pt cued to take one step at a time to improve technique. Pt improved with continued cuing and demonstration.   Education provided throughout session via VC/TC and demonstration to facilitate movement at target joints and correct muscle activation for all testing and exercises performed.                      PT  Education - 06/15/21 1348     Education Details exercise technique, stretching strategies    Person(s) Educated Patient    Methods Explanation;Demonstration;Tactile cues;Verbal cues    Comprehension Verbalized understanding;Returned demonstration;Verbal cues required;Tactile cues required;Need further instruction              PT Short Term Goals - 03/18/21 1306       PT SHORT TERM GOAL #1   Title Pt will be independent with HEP in order to improve strength and balance in order to decrease fall risk and improve function at home and work.    Baseline 01/27/2021- Patient  presents with no formal HEP in place. 03/18/2021- Patient is knowledgeable of HEP- WIfe reports he is not always compliant but not because I don't know what to do."    Time 6    Period Weeks    Status Achieved    Target Date 03/10/21               PT Long Term Goals - 05/13/21 1318       PT LONG TERM GOAL #1   Title Pt will improve BERG by at least 5 points in order to demonstrate clinically significant improvement in balance.    Baseline 01/27/2021= 43/56; 03/18/2021=50/56    Time 12    Period Weeks    Status Achieved      PT LONG TERM GOAL #2   Title Pt will improve FOTO to target score of 55% to display perceived improvements in ability to complete ADL's.    Baseline 01/27/2021= 42%; 04/22/2021- Will assess next visit; 05/06/2021= 64%    Time 12    Period Weeks    Status Achieved      PT LONG TERM GOAL #3   Title Pt will decrease 5TSTS by at least 3 seconds in order to demonstrate clinically significant improvement in LE strength.    Baseline 01/27/2021= 30.07 without UE support; 03/18/2021= 24.78 sec without UE Support; 04/22/2021= 14.47 sec without UE support    Time 12    Period Weeks    Status Achieved      PT LONG TERM GOAL #4   Title Pt will decrease TUG to below 15  seconds/decrease in order to demonstrate decreased fall risk.    Baseline 01/27/2021= 20.97 without an AD; 03/18/2021= 19.2 sec  without AD; 04/22/2021= 17 sec without AD; 05/13/2021= 14.88 sec    Time 12    Period Weeks    Status On-going    Target Date 07/15/21      PT LONG TERM GOAL #5   Title Pt will increase 10MWT by at least 0.13 m/s in order to demonstrate clinically significant improvement in community ambulation.    Baseline 01/27/2021= 0.67 m/s without an AD.  9/14= 0.75 m/s; 04/22/2021= 0.82 sec without an AD- WIll keep goal active to ensure patient can perform consistently. 05/13/2021= 0.89 m/s    Time 12    Period Weeks    Status Partially Met    Target Date 07/15/21      PT LONG TERM GOAL #6   Title Pt will increase 6MWT by at least 15m(1619f in order to demonstrate clinically significant improvement in cardiopulmonary endurance and community ambulation    Baseline 02/03/2021= 1100 feet; 03/18/21= 1125 feet without AD.  04/22/2021= 1175 feet without AD    Time 11    Period Weeks    Status On-going    Target Date 07/15/21                   Plan - 06/15/21 1349     Clinical Impression Statement Pt with great motivation for all interventions today. Pt shows continued improvements in static balance exercise, but is challenged by dual-task components still. Pt displayed minimal unsteadiness with dynamic balance exercises and responded well to cuing for increasing dorsiflexion to improve foot clearance. Pt continues to be challenged by coordination tasks involving the upper and lower extremities. Pt will benefit from skilled physical therapy to increase mobility and strength to improve patient's quality of life    Personal Factors and Comorbidities Comorbidity 3+  Comorbidities Anoxic brain injury (1999), Cardiac (pacemaker/debrillator), HTN    Examination-Activity Limitations Bathing;Caring for Others;Lift;Stairs;Transfers    Examination-Participation Restrictions Community Activity;Yard Work    Stability/Clinical Decision Making Stable/Uncomplicated    Rehab Potential Good    PT Frequency 1x /  week    PT Duration 12 weeks    PT Treatment/Interventions ADLs/Self Care Home Management;Cryotherapy;Moist Heat;DME Instruction;Gait training;Stair training;Functional mobility training;Therapeutic activities;Therapeutic exercise;Balance training;Neuromuscular re-education;Patient/family education;Manual techniques;Passive range of motion;Dry needling;Canalith Repostioning;Ultrasound;Vestibular;Joint Manipulations    PT Next Visit Plan Progress LE strengthening next visit and continue with  balance exercises.    PT Home Exercise Plan No changes this visit    Consulted and Agree with Plan of Care Patient;Family member/caregiver    Family Member Consulted Wife- [name]             Patient will benefit from skilled therapeutic intervention in order to improve the following deficits and impairments:  Abnormal gait, Decreased activity tolerance, Decreased balance, Decreased cognition, Decreased coordination, Decreased endurance, Decreased mobility, Decreased strength, Difficulty walking  Visit Diagnosis: Unsteadiness on feet  Other lack of coordination  Difficulty in walking, not elsewhere classified     Problem List Patient Active Problem List   Diagnosis Date Noted   Gait abnormality 12/16/2020   Nonintractable headache 09/30/2020   Tinnitus 09/09/2019   History of sudden cardiac arrest 01/09/2019   Viral syndrome 09/06/2018   Seizures ([place]) 03/02/2018   Syncope 12/26/2017   Other social stressor 12/26/2017   Health care maintenance 08/14/2017   Family hx of prostate cancer    Allergy    Insomnia 10/20/2015   Advance care planning 03/08/2014   Memory loss 09/14/2013   Hypoxic brain injury ([place]) 09/14/2013   Medicare annual wellness visit, subsequent 02/16/2013   Shortness of breath 08/10/2011   Implantable cardioverter-defibrillator (ICD) in situ 02/03/2009   Hypothyroidism 11/18/2008   CONTACT DERMATITIS&OTHER ECZEMA DUE TO PLANTS 02/03/2008   IDIOPATHIC URTICARIA  02/03/2008   Hypertension 02/03/2008   HLD (hyperlipidemia) 02-Nov-2007   Attention deficit disorder 11/02/2007   PVC/VT non sustained 02-Nov-2007   Sleep apnea 2007/11/02   HEADACHE November 02, 2007   SUDDEN DEATH-aborted 11-02-07   LOW BACK PAIN SYNDROME 10/11/2007   Karn Cassis, SPT  06/15/2021, 2:14 PM  [place] MAIN [facility] SERVICES [address] [place], [place], [address] Phone: [phone]   Fax:  [phone]  Name: Larry [name] MRN: [id number] Date of Birth: March 24, 1951

## 2021-06-17 ENCOUNTER — Ambulatory Visit: Payer: Medicare Other

## 2021-06-18 ENCOUNTER — Other Ambulatory Visit: Payer: Self-pay | Admitting: Family Medicine

## 2021-06-18 NOTE — Telephone Encounter (Signed)
Refill request for AMPHETAMINE SALTS 10MG  TAB(GEN ADDERALL)  LOV - 10/07/20 Next OV - not scheduled Last refill - 01/16/21 #180/0

## 2021-06-22 ENCOUNTER — Ambulatory Visit: Payer: Medicare Other | Admitting: Neurology

## 2021-06-23 ENCOUNTER — Ambulatory Visit (INDEPENDENT_AMBULATORY_CARE_PROVIDER_SITE_OTHER): Payer: Medicare Other

## 2021-06-23 ENCOUNTER — Other Ambulatory Visit: Payer: Self-pay

## 2021-06-23 DIAGNOSIS — Z23 Encounter for immunization: Secondary | ICD-10-CM | POA: Diagnosis not present

## 2021-06-23 NOTE — Progress Notes (Signed)
Per orders of Dr. Damita Dunnings Pt is here for annual flu vaccine, pt received injection IM in the left deltoid, given by Leonor Liv., CMA. Pt tolerated injection well. Pt was instructed to report any adverse reactions to me immediately.

## 2021-06-24 ENCOUNTER — Ambulatory Visit: Payer: Medicare Other

## 2021-06-24 DIAGNOSIS — M6281 Muscle weakness (generalized): Secondary | ICD-10-CM

## 2021-06-24 DIAGNOSIS — R262 Difficulty in walking, not elsewhere classified: Secondary | ICD-10-CM

## 2021-06-24 DIAGNOSIS — R2681 Unsteadiness on feet: Secondary | ICD-10-CM | POA: Diagnosis not present

## 2021-06-24 DIAGNOSIS — R278 Other lack of coordination: Secondary | ICD-10-CM | POA: Diagnosis not present

## 2021-06-24 DIAGNOSIS — R269 Unspecified abnormalities of gait and mobility: Secondary | ICD-10-CM | POA: Diagnosis not present

## 2021-06-24 NOTE — Therapy (Signed)
[place] MAIN [facility] SERVICES [address] [place], [place], [address] Phone: [phone]   Fax:  [phone]  Physical Therapy Treatment  Patient Details  Name: Larry [name] MRN: [id number] Date of Birth: 1951/02/23 Referring Provider (PT): Dr. Krista Blue   Encounter Date: 06/24/2021   PT End of Session - 06/24/21 1315     Visit Number 25    Number of Visits 29    Date for PT Re-Evaluation 07/15/21    Authorization Type [employer] Medicare    Authorization Time Period 01/27/2021- 58/52/7782; Recert 42/35/3614- 43/15/4008    PT Start Time 1306    PT Stop Time 1344    PT Time Calculation (min) 38 min    Equipment Utilized During Treatment Gait belt    Activity Tolerance Patient tolerated treatment well    Behavior During Therapy WFL for tasks assessed/performed             Past Medical History:  Diagnosis Date   ADD (attention deficit disorder)    Allergy    seasonal flares of perinneal allergies   Cancer ([place])    melanoma   Cardiac arrest ([place]) 07/1997   aborted   Dementia ([place])    post resusitative   Depression    Diverticulosis    Family hx of prostate cancer    GERD (gastroesophageal reflux disease)    Headache(784.0)    Hyperlipidemia    Hypothyroidism    ICD (implantable cardiac defibrillator) in place    Idiopathic urticaria    Internal hemorrhoids    Low back pain syndrome    Melanoma in situ of skin of trunk ([place])    chest   PVC (premature ventricular contraction)    associated with cardiac arrest   Sleep apnea    no c-pap   Tubular adenoma of colon 10/2010    Past Surgical History:  Procedure Laterality Date    implantation x2     CARDIAC DEFIBRILLATOR PLACEMENT  07/01/2010   Explanation of a previously implanted device, pocket revision, and insertion of a new device, and intraoperative defibrillation threshold testing Caryl Comes)   CARDIAC DEFIBRILLATOR REMOVAL  2004   Defibrillator change Caryl Comes) 3 times  changed   CATARACT EXTRACTION W/PHACO Left 02/04/2021   Procedure: CATARACT EXTRACTION PHACO AND INTRAOCULAR LENS PLACEMENT ([place]) LEFT VIVITY toric LENS 9.21 01:11.6;  Surgeon: Leandrew Koyanagi, MD;  Location: [place];  Service: Ophthalmology;  Laterality: Left;  keep patient at 11:30 arrival   CATARACT EXTRACTION W/PHACO Right 03/04/2021   Procedure: CATARACT EXTRACTION PHACO AND INTRAOCULAR LENS PLACEMENT ([place]) RIGHT VIVITY LENS 8.59 01:15.3;  Surgeon: Leandrew Koyanagi, MD;  Location: [place];  Service: Ophthalmology;  Laterality: Right;   HEMORRHOID SURGERY     ICD     TONSILLECTOMY      There were no vitals filed for this visit.   Subjective Assessment - 06/24/21 1316     Subjective Patient reports doing well and denies any falls.    Patient is accompained by: Family member    Pertinent History Per note submitted by Dr. Krista Blue on 12/15/2020- The patient has an underlying medical history of cardiac death from ventricular fibrillation with hypoxic brain injury in Jan 1999 due to arrythmia, 15 minutes without heart beat,  status post defibrillator placement, He stayed in hospital for 35 days. On 12/16/2020-Gait abnormality, Also related to the previous anoxic brain injury, deconditioning,    Limitations Walking;House hold activities    How long can you sit  comfortably? no limitations    How long can you stand comfortably? Not really limited    How long can you walk comfortably? 15-20    Patient Stated Goals To improve transfers, balance and walking.    Currently in Pain? No/denies    Pain Onset More than a month ago               INTERVENTIONS:  Therapeutic exercises:   Leg press level 4 2 sets of 12 reps- Increased verbal cues on how to use equipment - and cues later on to have patient slow down  Sit to stand without UE support x 12 reps- VC for correct technique- Patient fatigued but able to complete.  Lunge squat BLE x 12 reps - VC for adequate  technique with lunge and paitent with mild unsteadiness without UE support.   Dead lift- 7 lb. [place] in each arm x 12 reps x 2 sets- Max VC and visual demo for correct technique.  Step up onto 2nd step (at steps with BUE Support) - Initial difficulty with coordination yet patient improved over time x 15 reps each  Hip abd (up and over) orange hurdle  without UE support x 15 reps each direction      Education provided throughout session via VC/TC and demonstration to facilitate movement at target joints and correct muscle activation for all testing and exercises performed.                    PT Education - 06/24/21 1329     Education Details Exercise technique, strengthening strategies    Person(s) Educated Patient    Methods Explanation;Demonstration;Tactile cues;Verbal cues    Comprehension Verbalized understanding;Returned demonstration;Verbal cues required;Need further instruction;Tactile cues required              PT Short Term Goals - 03/18/21 1306       PT SHORT TERM GOAL #1   Title Pt will be independent with HEP in order to improve strength and balance in order to decrease fall risk and improve function at home and work.    Baseline 01/27/2021- Patient presents with no formal HEP in place. 03/18/2021- Patient is knowledgeable of HEP- WIfe reports he is not always compliant but not because I don't know what to do."    Time 6    Period Weeks    Status Achieved    Target Date 03/10/21               PT Long Term Goals - 05/13/21 1318       PT LONG TERM GOAL #1   Title Pt will improve BERG by at least 5 points in order to demonstrate clinically significant improvement in balance.    Baseline 01/27/2021= 43/56; 03/18/2021=50/56    Time 12    Period Weeks    Status Achieved      PT LONG TERM GOAL #2   Title Pt will improve FOTO to target score of 55% to display perceived improvements in ability to complete ADL's.    Baseline 01/27/2021= 42%;  04/22/2021- Will assess next visit; 05/06/2021= 64%    Time 12    Period Weeks    Status Achieved      PT LONG TERM GOAL #3   Title Pt will decrease 5TSTS by at least 3 seconds in order to demonstrate clinically significant improvement in LE strength.    Baseline 01/27/2021= 30.07 without UE support; 03/18/2021= 24.78 sec without UE Support; 04/22/2021= 14.47 sec  without UE support    Time 12    Period Weeks    Status Achieved      PT LONG TERM GOAL #4   Title Pt will decrease TUG to below 15  seconds/decrease in order to demonstrate decreased fall risk.    Baseline 01/27/2021= 20.97 without an AD; 03/18/2021= 19.2 sec without AD; 04/22/2021= 17 sec without AD; 05/13/2021= 14.88 sec    Time 12    Period Weeks    Status On-going    Target Date 07/15/21      PT LONG TERM GOAL #5   Title Pt will increase 10MWT by at least 0.13 m/s in order to demonstrate clinically significant improvement in community ambulation.    Baseline 01/27/2021= 0.67 m/s without an AD.  9/14= 0.75 m/s; 04/22/2021= 0.82 sec without an AD- WIll keep goal active to ensure patient can perform consistently. 05/13/2021= 0.89 m/s    Time 12    Period Weeks    Status Partially Met    Target Date 07/15/21      PT LONG TERM GOAL #6   Title Pt will increase 6MWT by at least 54m(1649f in order to demonstrate clinically significant improvement in cardiopulmonary endurance and community ambulation    Baseline 02/03/2021= 1100 feet; 03/18/21= 1125 feet without AD.  04/22/2021= 1175 feet without AD    Time 11    Period Weeks    Status On-going    Target Date 07/15/21                   Plan - 06/25/21 0807     Clinical Impression Statement Today's visit focus primarily on LE strengthening/stamina and patient was well motivated for today's session. He was able to accept verbal and visual cues for proper exercise technique and implement and correctly perform LE strengthening exercises well today - no report of any pain and  only min unsteadiness with Lunges, step ups and Hip abd. He was fatigued at end of session. Pt will benefit from skilled physical therapy to increase mobility and strength to improve patient's quality of life    Personal Factors and Comorbidities Comorbidity 3+    Comorbidities Anoxic brain injury (1999), Cardiac (pacemaker/debrillator), HTN    Examination-Activity Limitations Bathing;Caring for Others;Lift;Stairs;Transfers    Examination-Participation Restrictions Community Activity;Yard Work    Stability/Clinical Decision Making Stable/Uncomplicated    Rehab Potential Good    PT Frequency 1x / week    PT Duration 12 weeks    PT Treatment/Interventions ADLs/Self Care Home Management;Cryotherapy;Moist Heat;DME Instruction;Gait training;Stair training;Functional mobility training;Therapeutic activities;Therapeutic exercise;Balance training;Neuromuscular re-education;Patient/family education;Manual techniques;Passive range of motion;Dry needling;Canalith Repostioning;Ultrasound;Vestibular;Joint Manipulations    PT Next Visit Plan Progress LE strengthening next visit and continue with  balance exercises.    PT Home Exercise Plan No changes this visit    Consulted and Agree with Plan of Care Patient;Family member/caregiver    Family Member Consulted Wife- KaJuliann Pulse           Patient will benefit from skilled therapeutic intervention in order to improve the following deficits and impairments:  Abnormal gait, Decreased activity tolerance, Decreased balance, Decreased cognition, Decreased coordination, Decreased endurance, Decreased mobility, Decreased strength, Difficulty walking  Visit Diagnosis: Abnormality of gait and mobility  Difficulty in walking, not elsewhere classified  Muscle weakness (generalized)  Unsteadiness on feet     Problem List Patient Active Problem List   Diagnosis Date Noted   Gait abnormality 12/16/2020   Nonintractable headache 09/30/2020   Tinnitus  09/09/2019    History of sudden cardiac arrest 01/09/2019   Viral syndrome 09/06/2018   Seizures ([place]) 03/02/2018   Syncope 12/26/2017   Other social stressor 12/26/2017   Health care maintenance 08/14/2017   Family hx of prostate cancer    Allergy    Insomnia 10/20/2015   Advance care planning 03/08/2014   Memory loss 09/14/2013   Hypoxic brain injury ([place]) 09/14/2013   Medicare annual wellness visit, subsequent 02/16/2013   Shortness of breath 08/10/2011   Implantable cardioverter-defibrillator (ICD) in situ 02/03/2009   Hypothyroidism 11/18/2008   CONTACT DERMATITIS&OTHER ECZEMA DUE TO PLANTS 02/03/2008   IDIOPATHIC URTICARIA 02/03/2008   Hypertension 02/03/2008   HLD (hyperlipidemia) 11-01-07   Attention deficit disorder Nov 01, 2007   PVC/VT non sustained 11-01-07   Sleep apnea 11/01/2007   HEADACHE 2007/11/01   SUDDEN DEATH-aborted 01-Nov-2007   LOW BACK PAIN SYNDROME 10/11/2007    Lewis Moccasin, PT 06/25/2021, 8:16 AM  [place] MAIN [facility] SERVICES [address] [place], [place], [address] Phone: [phone]   Fax:  [phone]  Name: Larry [name] MRN: [id number] Date of Birth: May 20, 1951

## 2021-07-01 ENCOUNTER — Ambulatory Visit: Payer: Medicare Other

## 2021-07-02 ENCOUNTER — Ambulatory Visit: Payer: Medicare Other | Admitting: Neurology

## 2021-07-02 ENCOUNTER — Encounter: Payer: Self-pay | Admitting: Neurology

## 2021-07-02 VITALS — BP 146/82 | HR 72 | Ht 69.0 in | Wt 176.5 lb

## 2021-07-02 DIAGNOSIS — R413 Other amnesia: Secondary | ICD-10-CM

## 2021-07-02 DIAGNOSIS — M542 Cervicalgia: Secondary | ICD-10-CM

## 2021-07-02 DIAGNOSIS — R569 Unspecified convulsions: Secondary | ICD-10-CM

## 2021-07-02 DIAGNOSIS — G931 Anoxic brain damage, not elsewhere classified: Secondary | ICD-10-CM

## 2021-07-02 NOTE — Patient Instructions (Signed)
[facility]    Address: [address], [place], [place] [address]  Phone: [phone]   

## 2021-07-02 NOTE — Progress Notes (Signed)
HISTORY OF PRESENT ILLNESS:[name] is a very friendly 70 year old right-handed gentleman presents for followup consultation of his memory loss in the context of anoxic brain injury.    He is accompanied by his wife today. He was a patient of Dr. Erling [name], The patient has an underlying medical history of cardiac death from ventricular fibrillation with hypoxic brain injury in Jan 1999 due to arrythmia, 15 minutes without heart beat,  status post defibrillator placement, He stayed in hospital for 35 days, required prolonged rehabilitation, has to relearn walking again, significant amnesia, "as if that he did not have a past".   EEG showed diffuse slowing in January 1999, CT head and brain MRI were normal. CT head repeat in November 2000 showed slight prominence of the lateral and third ventricles. CT head in July 2003 showed prominence of the ventricular system. Psychological testing and August 1999 showed borderline performance IQ. He became depressed. In January 2014 his MMSE was 29, clock drawing was 4, animal fluency was 10.  He went to [facility] for 2 years for patient with degenerative disorder.   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 He lives at home with his wife, still driving.   He helps the household, he still has difficulty focusing.    He has had some fluctuation in his memory per wife. He has ADD and continues to take Adderall, 10 mg twice a day, without Adderall, he could not remember  the pages that he has read,he is also taking Aricept 10 mg every day, which has been very helpful too.   Up date April 14 2016YY   His overall doing very well, he likes play computer games, which has helped his eye and hands coordination, he had recent trip to [place], felt overwhelming, he continued to drive short distance,   He is taking Ritalin 10 mg twice a day, Aricept 10 mg once a day, complains of insomnia, he took  his last dose of Ritalin at evening time.   UPDATE Apr 21 2015:YY He is now taking Namenda 42m bid and aricept 12mqday, he still has trouble sleeping,  He watch movie, stay up late, he continues to have focus difficulty, tends to become agitated.   UPDATE April 27th 2017: YY He still has chronic insomnia, tends to have irregular sleep pattern, stay up late watching TV, he still very active during the day, take Adderall 10 mg twice a day, still drives short distance, mild unsteady gait, tends to hold his left arm to his chest   UPDATE Oct 17th 2017:YY He is with his wife,  Her daughter was hospital, presented with sudden loss of consciousness yesterday April 19 2016, which has brought back a lot of his memory, he has not tried clonazepam for chronic insomnia, worry about side effect, tried melatonin, still has significant insomnia, he is taking Tylenol pm,    UPDATE March 02 2018: He was follow-up by [facility]ern 2018, on December 19, 2017, [place] he had sudden onset dizziness, feels like going to pass out, then had transient loss of consciousness, eyes rolled back, staring, lasting for 1 minutes, followed by postevent mild confusion and slurred speech last about 40 minutes, was treated at local hospital.   I personally reviewed CT head without contrast: There was no acute abnormality, generalized atrophy, ventriculomegaly,  laboratory evaluation, EKG showed no significant abnormality.  Similar spells in March 2019, transient dizziness followed by sudden loss of muscle tone, consciousness, extreme fatigue, slept for 1 hour afterwards,   Patient reported that he actually has frequent spells about once a month, he has transient confusion, feels like he is going to pass out, short lasting,   UPDATE Jun 05 2018: He tolerated lamotrigine very well, taking 100 mg twice a day without significant side effect,   EEG was normal.  Since lamotrigine, he no  longer has recurrent spells of rising sensation transient confusion even though there was no clinical seizure activity observed during those spells, he had those spells about once a week prior to lamotrigine,  UPDATE Jun 19 2020: Is accompanied by his wife at today's clinical visit, slow worsening memory loss, when he goes to a different room, he often forgets why is he there, slight worsening gait abnormality, he also complains of worsening tinnitus, difficulty sleeping sometimes,  I personally reviewed CT temporal bone w/wo in April 2021:  1. Negative CT appearance of the bilateral temporal bones. No explanation for hearing loss. 2. Note a mildly high riding right jugular bulb, with thin but intact overlying bony covering. 3. Hyperplastic but clear visible paranasal sinuses.  UPDATE March 29th 2022: He used to have headache even before his cardiac arrest, only occasionally, mild, his 2 daughter does has typical migraine headache,  Over the years, he has occasionally headache usually short lasting, on March 26, he complains of transient sharp pain shooting from the left parietal region downward, lasting for few seconds, then had a trickle of pressure headache, take ibuprofen, which did improve  Next morning March 27 Sunday, while at church, at the end of the sermon, he was noted to holding his left side, he also noticed transient blurry vision, was brought to the emergency room,  I personally reviewed CT head with contrast, no acute intracranial abnormality, no venous sinus thrombosis, arterial branch that was visualized showed no significant abnormality  Laboratory showed normal ESR, negative troponin, normal CBC, BMP with exception of elevated glucose 140, was seen by ophthalmology today, no significant abnormality found, was told he has cataract, left side was slightly worse than the right  Update June 14th 2022: I reviewed his headache diary, few episode in March, transient sharp, only 1  episode on October 19, 2020, no longer has frequent headaches, reported worsening vision bilaterally, left worse than right, on today's near chart examination, visual acuity was 20/50 OS/OD,  Also complains of worsening gait abnormality, wife noticed worsening memory loss,  We personally reviewed CT of temporal bone with without contrast in April 2021 for evaluation of his reported hearing loss, no acute abnormality, CT venogram of brain on September 28, 2020 that was normal  Update July 02, 2021: Is accompanied by his wife, continued slow worsening, increased memory loss, slow reaction time, continue to drive, wife has to be vigilant sitting at the passenger side, MoCA examination 22/30 today,  Over the past few months, he had few episode of transient ice pricking pain, not long-lasting, He also complains of back pain, rigidity, decreased range of motion  REVIEW OF SYSTEMS: Out of a complete 14 system review of symptoms, the patient complains only of the following symptoms, and all other reviewed systems are negative.  Memory loss, seizures  ALLERGIES: Allergies  Allergen Reactions   Penicillins     Difficult Breathing, rash    HOME MEDICATIONS: Outpatient Medications Prior to Visit  Medication Sig Dispense Refill   amphetamine-dextroamphetamine (ADDERALL)  10 MG tablet TAKE ONE TABLET BY MOUTH TWICE A DAY 180 tablet 0   Ascorbic Acid (VITAMIN C) 500 MG tablet Take 500 mg by mouth daily.     aspirin 81 MG tablet Take 81 mg by mouth daily.     Cholecalciferol (VITAMIN D3) 10 MCG (400 UNIT) tablet Take 400 Units by mouth daily.     diltiazem (CARDIZEM CD) 120 MG 24 hr capsule TAKE 1 CAPSULE BY MOUTH  DAILY 90 capsule 3   donepezil (ARICEPT) 10 MG tablet TAKE 1 TABLET BY MOUTH  DAILY 90 tablet 3   fexofenadine (ALLEGRA) 180 MG tablet Take 180 mg by mouth daily.     Ginkgo Biloba 40 MG TABS Take by mouth as directed.     lamoTRIgine (LAMICTAL) 100 MG tablet TAKE 1 TABLET BY MOUTH  TWICE  DAILY 180 tablet 3   levothyroxine (SYNTHROID) 137 MCG tablet Take 1 tablet (137 mcg total) by mouth daily before breakfast. 90 tablet 3   Melatonin 5 MG CAPS Take by mouth as directed.     memantine (NAMENDA) 10 MG tablet TAKE 1 TABLET BY MOUTH  TWICE DAILY 180 tablet 3   metoprolol succinate (TOPROL-XL) 25 MG 24 hr tablet TAKE 1 TABLET BY MOUTH  DAILY 90 tablet 3   montelukast (SINGULAIR) 10 MG tablet TAKE 1 TABLET BY MOUTH  DAILY 90 tablet 3   Multiple Vitamin (MULTIVITAMIN) tablet Take 700 tablets by mouth daily.     NON FORMULARY Focus factor 1 tab po bid     rosuvastatin (CRESTOR) 20 MG tablet Take 1 tablet (20 mg total) by mouth at bedtime. 90 tablet 3   vitamin B-12 (CYANOCOBALAMIN) 1000 MCG tablet Take 1,000 mcg by mouth daily.     vitamin E 180 MG (400 UNITS) capsule Take 400 Units by mouth daily.     No facility-administered medications prior to visit.    PAST MEDICAL HISTORY: Past Medical History:  Diagnosis Date   ADD (attention deficit disorder)    Allergy    seasonal flares of perinneal allergies   Cancer ([place])    melanoma   Cardiac arrest ([place]) 07/1997   aborted   Dementia ([place])    post resusitative   Depression    Diverticulosis    Family hx of prostate cancer    GERD (gastroesophageal reflux disease)    Headache(784.0)    Hyperlipidemia    Hypothyroidism    ICD (implantable cardiac defibrillator) in place    Idiopathic urticaria    Internal hemorrhoids    Low back pain syndrome    Melanoma in situ of skin of trunk ([place])    chest   PVC (premature ventricular contraction)    associated with cardiac arrest   Sleep apnea    no c-pap   Tubular adenoma of colon 10/2010    PAST SURGICAL HISTORY: Past Surgical History:  Procedure Laterality Date    implantation x2     CARDIAC DEFIBRILLATOR PLACEMENT  07/01/2010   Explanation of a previously implanted device, pocket revision, and insertion of a new device, and intraoperative defibrillation threshold  testing Caryl Comes)   CARDIAC DEFIBRILLATOR REMOVAL  2004   Defibrillator change Caryl Comes) 3 times changed   CATARACT EXTRACTION W/PHACO Left 02/04/2021   Procedure: CATARACT EXTRACTION PHACO AND INTRAOCULAR LENS PLACEMENT ([place]) LEFT VIVITY toric LENS 9.21 01:11.6;  Surgeon: Leandrew Koyanagi, MD;  Location: [facility];  Service: Ophthalmology;  Laterality: Left;  keep patient at 11:30 arrival   CATARACT EXTRACTION W/PHACO  Right 03/04/2021   Procedure: CATARACT EXTRACTION PHACO AND INTRAOCULAR LENS PLACEMENT ([place]) RIGHT VIVITY LENS 8.59 01:15.3;  Surgeon: Leandrew Koyanagi, MD;  Location: [facility];  Service: Ophthalmology;  Laterality: Right;   HEMORRHOID SURGERY     ICD     TONSILLECTOMY      FAMILY HISTORY: Family History  Problem Relation Age of Onset   Hypertension Mother    Heart disease Father    Diverticulosis Father    Coronary artery disease Father    Alopecia Father    Prostate cancer Brother    Lung cancer Paternal Grandfather        Smoker   Cancer Paternal Grandfather        lung cancer - smoker   Diabetes Brother    Heart disease Brother        heart transplant   Colon cancer Neg Hx     SOCIAL HISTORY: Social History   Socioeconomic History   Marital status: Married    Spouse name: [name]   Number of children: 2   Years of education: College   Highest education level: Not on file  Occupational History    Employer: DISABLED  Tobacco Use   Smoking status: Former    Packs/day: 1.00    Years: 10.00    Pack years: 10.00    Types: Cigarettes    Quit date: 07/06/1995    Years since quitting: 26.0   Smokeless tobacco: Former    Types: Chew    Quit date: 11/04/1994  Vaping Use   Vaping Use: Never used  Substance and Sexual Activity   Alcohol use: No    Alcohol/week: 0.0 standard drinks    Comment: socially   Drug use: No   Sexual activity: Not on file  Other Topics Concern   Not on file  Social History Narrative   Patient is married  [name]) [phone] with 2 daughters   5 grandchildren   Daily caffeine use - one cup of coffee and one-two cokes per day   Patient is right-handed.   Patient has a college education   Social Determinants of Radio broadcast assistant Strain: Low Risk    Difficulty of Paying Living Expenses: Not hard at all  Food Insecurity: No Food Insecurity   Worried About Charity fundraiser in the Last Year: Never true   Arboriculturist in the Last Year: Never true  Transportation Needs: No Transportation Needs   Lack of Transportation (Medical): No   Lack of Transportation (Non-Medical): No  Physical Activity: Inactive   Days of Exercise per Week: 0 days   Minutes of Exercise per Session: 0 min  Stress: No Stress Concern Present   Feeling of Stress : Not at all  Social Connections: Not on file  Intimate Partner Violence: Not At Risk   Fear of Current or Ex-Partner: No   Emotionally Abused: No   Physically Abused: No   Sexually Abused: No   PHYSICAL EXAM  Vitals:   07/02/21 1041  BP: (!) 146/82  Pulse: 72  Weight: 176 lb 8 oz (80.1 kg)  Height: 5' 9" (1.753 m)   Body mass index is 26.06 kg/m.  Generalized: Well developed, in no acute distress  Montreal Cognitive Assessment  07/02/2021  Visuospatial/ Executive (0/5) 3  Naming (0/3) 3  Attention: Read list of digits (0/2) 2  Attention: Read list of letters (0/1) 1  Attention: Serial 7 subtraction starting at 100 (0/3) 2  Language: Repeat  phrase (0/2) 1  Language : Fluency (0/1) 1  Abstraction (0/2) 2  Delayed Recall (0/5) 2  Orientation (0/6) 5  Total 22     NEUROLOGICAL EXAM:  MENTAL STATUS: Speech/Cognition: Awake, alert, normal speech, oriented to history taking and casual conversation.  CRANIAL NERVES: CN II: Visual fields are full to confrontation.  Pupils are small, reactive CN III, IV, VI: extraocular movement are normal. No ptosis. CN V: Facial sensation is intact to light touch. CN VII: Face is symmetric with  normal eye closure and smile. CN VIII: Hearing is normal to casual conversation CN IX, X: Palate elevates symmetrically. Phonation is normal. CN XI: Head turning and shoulder shrug are intact  MOTOR: Fixation left upper extremity on rapid rotating movement  REFLEXES: Reflexes are 2  and symmetric at the biceps, triceps, knees and ankles. Plantar responses are flexor.  SENSORY: Intact to light touch, pinprick, positional and vibratory sensation at fingers and toes.  COORDINATION: There is no trunk or limb ataxia.    GAIT/STANCE: He needs push-up to get up from seated position, tends to hold his left arm in elbow flexion, mildly wide-based, unsteady  DIAGNOSTIC DATA (LABS, IMAGING, TESTING) - I reviewed patient records, labs, notes, testing and imaging myself where available.  Lab Results  Component Value Date   WBC 8.5 10/03/2020   HGB 14.9 10/03/2020   HCT 43.7 10/03/2020   MCV 92.4 10/03/2020   PLT 337.0 10/03/2020      Component Value Date/Time   NA 141 10/03/2020 0804   NA 140 06/12/2019 1015   K 4.1 10/03/2020 0804   CL 102 10/03/2020 0804   CO2 32 10/03/2020 0804   GLUCOSE 99 10/03/2020 0804   BUN 13 10/03/2020 0804   BUN 13 06/12/2019 1015   CREATININE 1.08 10/03/2020 0804   CREATININE 1.07 12/23/2017 1451   CALCIUM 9.8 10/03/2020 0804   PROT 6.9 10/03/2020 0804   PROT 6.6 06/12/2019 1015   ALBUMIN 4.5 10/03/2020 0804   ALBUMIN 4.4 06/12/2019 1015   AST 23 10/03/2020 0804   ALT 30 10/03/2020 0804   ALKPHOS 72 10/03/2020 0804   BILITOT 0.6 10/03/2020 0804   BILITOT 0.3 06/12/2019 1015   GFRNONAA >60 09/28/2020 1534   GFRAA 101 06/12/2019 1015   Lab Results  Component Value Date   CHOL 207 (H) 10/03/2020   HDL 62.00 10/03/2020   LDLCALC 119 (H) 10/03/2020   LDLDIRECT 127.6 03/05/2014   TRIG 129.0 10/03/2020   CHOLHDL 3 10/03/2020   Lab Results  Component Value Date   HGBA1C 6.0 10/03/2020   No results found for: VITAMINB12 Lab Results   Component Value Date   TSH 5.94 (H) 12/08/2020      ASSESSMENT AND PLAN 70 y.o. year old male    Anoxic brain injury in 1999 due to prolonged cardiac arrest,  With residual mild cognitive impairment  Slow worsening over the years  Continue Aricept 10 mg daily, Namenda 10 mg twice a day  Encouraged moderate exercise  MoCA examination 22/30 on 12//2022  We also discussed intermittent about his driving privilege, driver license was just renewed in January 2022, he does have slow reaction time, I have limit his driving to short distance daytime only,  Seizure  Most recent in June 2019, EEG was normal  Continue lamotrigine 100 mg twice a day  Gait abnormality  Related to the previous anoxic brain injury, deconditioning,  Headache  Description of headache most consistent with ice prick headache, suspicious he might  have underlying migraine  Repeat ESR, C-reactive protein was within normal limit in June 2022,  Worsening neck pain, limited range of motion  X-ray of cervical spine    Marcial Pacas, M.D. Ph.D.  [facility] [place], [place] [address] Phone: [phone] Fax:      [phone]

## 2021-07-08 ENCOUNTER — Other Ambulatory Visit: Payer: Self-pay

## 2021-07-08 ENCOUNTER — Ambulatory Visit: Payer: Medicare Other | Attending: Neurology | Admitting: Physical Therapy

## 2021-07-08 DIAGNOSIS — M6281 Muscle weakness (generalized): Secondary | ICD-10-CM | POA: Insufficient documentation

## 2021-07-08 DIAGNOSIS — R278 Other lack of coordination: Secondary | ICD-10-CM | POA: Insufficient documentation

## 2021-07-08 DIAGNOSIS — R296 Repeated falls: Secondary | ICD-10-CM | POA: Diagnosis not present

## 2021-07-08 DIAGNOSIS — R262 Difficulty in walking, not elsewhere classified: Secondary | ICD-10-CM | POA: Insufficient documentation

## 2021-07-08 DIAGNOSIS — R2681 Unsteadiness on feet: Secondary | ICD-10-CM | POA: Insufficient documentation

## 2021-07-08 DIAGNOSIS — R269 Unspecified abnormalities of gait and mobility: Secondary | ICD-10-CM | POA: Diagnosis not present

## 2021-07-08 NOTE — Therapy (Signed)
[place] MAIN [facility] SERVICES [address] [place], [place], [address] Phone: [phone]   Fax:  [phone]  Physical Therapy Treatment  Patient Details  Name: Larry [name] MRN: [id number] Date of Birth: May 03, 1951 Referring Provider (PT): Dr. Krista Blue   Encounter Date: 07/08/2021   PT End of Session - 07/08/21 0913     Visit Number 26    Number of Visits 29    Date for PT Re-Evaluation 07/15/21    Authorization Type UHC Medicare    Authorization Time Period 01/27/2021- 42/68/3419; Recert 62/22/9798- 92/05/9416    PT Start Time 0808    PT Stop Time 0846    PT Time Calculation (min) 38 min    Equipment Utilized During Treatment Gait belt    Activity Tolerance Patient tolerated treatment well    Behavior During Therapy [facility] for tasks assessed/performed             Past Medical History:  Diagnosis Date   ADD (attention deficit disorder)    Allergy    seasonal flares of perinneal allergies   Cancer ([place])    melanoma   Cardiac arrest ([place]) 07/1997   aborted   Dementia ([place])    post resusitative   Depression    Diverticulosis    Family hx of prostate cancer    GERD (gastroesophageal reflux disease)    Headache(784.0)    Hyperlipidemia    Hypothyroidism    ICD (implantable cardiac defibrillator) in place    Idiopathic urticaria    Internal hemorrhoids    Low back pain syndrome    Melanoma in situ of skin of trunk ([place])    chest   PVC (premature ventricular contraction)    associated with cardiac arrest   Sleep apnea    no c-pap   Tubular adenoma of colon 10/2010    Past Surgical History:  Procedure Laterality Date    implantation x2     CARDIAC DEFIBRILLATOR PLACEMENT  07/01/2010   Explanation of a previously implanted device, pocket revision, and insertion of a new device, and intraoperative defibrillation threshold testing Caryl Comes)   CARDIAC DEFIBRILLATOR REMOVAL  2004   Defibrillator change Caryl Comes) 3 times changed    CATARACT EXTRACTION W/PHACO Left 02/04/2021   Procedure: CATARACT EXTRACTION PHACO AND INTRAOCULAR LENS PLACEMENT ([place]) LEFT VIVITY toric LENS 9.21 01:11.6;  Surgeon: Leandrew Koyanagi, MD;  Location: [place];  Service: Ophthalmology;  Laterality: Left;  keep patient at 11:30 arrival   CATARACT EXTRACTION W/PHACO Right 03/04/2021   Procedure: CATARACT EXTRACTION PHACO AND INTRAOCULAR LENS PLACEMENT ([place]) RIGHT VIVITY LENS 8.59 01:15.3;  Surgeon: Leandrew Koyanagi, MD;  Location: [place];  Service: Ophthalmology;  Laterality: Right;   HEMORRHOID SURGERY     ICD     TONSILLECTOMY      There were no vitals filed for this visit.   Subjective Assessment - 07/08/21 0912     Subjective Patient states he is doing well. Denies falls. Wife states pt neurologist has recommened an x-ray of the cervical spine due to chronic stiffness (pt unable to [facility] MRI) - pt planning to go for imaging next week.    Patient is accompained by: Family member    Pertinent History Per note submitted by Dr. Krista Blue on 12/15/2020- The patient has an underlying medical history of cardiac death from ventricular fibrillation with hypoxic brain injury in Jan 1999 due to arrythmia, 15 minutes without heart beat,  status post defibrillator placement, He stayed in hospital  for 35 days. On 12/16/2020-Gait abnormality, Also related to the previous anoxic brain injury, deconditioning,    Limitations Walking;House hold activities    How long can you sit comfortably? no limitations    How long can you stand comfortably? Not really limited    How long can you walk comfortably? 15-20    Patient Stated Goals To improve transfers, balance and walking.    Currently in Pain? No/denies    Pain Onset More than a month ago               INTERVENTIONS:   Therapeutic exercises:    Leg press 70# x 12 reps; 85# x 12 reps   STS without UE support x 12 reps- VC for correct technique   STS with staggered  stance x  10 reps BLE, no UE support   Forward stepping lunge x 12 reps BLE - VC for adequate technique with lunge and paitent with mild unsteadiness without UE support.    Dead lift - 9 lb. dumbbell in each arm x 12 reps x 2 sets- VC and visual demo for correct technique.   Step up onto 2nd step (at steps with no UE support) - Initial difficulty with coordination yet patient improved over time x 15 reps each   Hip abd (up and over) orange hurdle without UE support x 20 reps each direction   Side-stepping through agility ladder, focus on sequencing and coordination followed by speed; x 5 reps each direction.       Education provided throughout session via VC/TC and demonstration to facilitate movement at target joints and correct muscle activation for all testing and exercises performed.      Clinical impression: Pt demonstrates excellent motivation throughout PT session; wife encouraging. Continued focus of POC on LE strengthening and endurance with progressions in weight and reps. Stability improved with both lunging and step ups. He does continue to have difficulty with coordination and sequencing however this did improve with reps and decreased VC on sequencing - PT allowing pt to internally process, make mistakes and self-correct. Agility ladder was added to end of session with added focus of speed. Pt may benefit from metronome for pacing. Pt will benefit from skilled physical therapy to increase mobility and strength to improve patient's quality of life.               PT Short Term Goals - 03/18/21 1306       PT SHORT TERM GOAL #1   Title Pt will be independent with HEP in order to improve strength and balance in order to decrease fall risk and improve function at home and work.    Baseline 01/27/2021- Patient presents with no formal HEP in place. 03/18/2021- Patient is knowledgeable of HEP- WIfe reports he is not always compliant but not because I don't know what to do."     Time 6    Period Weeks    Status Achieved    Target Date 03/10/21               PT Long Term Goals - 05/13/21 1318       PT LONG TERM GOAL #1   Title Pt will improve BERG by at least 5 points in order to demonstrate clinically significant improvement in balance.    Baseline 01/27/2021= 43/56; 03/18/2021=50/56    Time 12    Period Weeks    Status Achieved      PT LONG TERM GOAL #2  Title Pt will improve FOTO to target score of 55% to display perceived improvements in ability to complete ADL's.    Baseline 01/27/2021= 42%; 04/22/2021- Will assess next visit; 05/06/2021= 64%    Time 12    Period Weeks    Status Achieved      PT LONG TERM GOAL #3   Title Pt will decrease 5TSTS by at least 3 seconds in order to demonstrate clinically significant improvement in LE strength.    Baseline 01/27/2021= 30.07 without UE support; 03/18/2021= 24.78 sec without UE Support; 04/22/2021= 14.47 sec without UE support    Time 12    Period Weeks    Status Achieved      PT LONG TERM GOAL #4   Title Pt will decrease TUG to below 15  seconds/decrease in order to demonstrate decreased fall risk.    Baseline 01/27/2021= 20.97 without an AD; 03/18/2021= 19.2 sec without AD; 04/22/2021= 17 sec without AD; 05/13/2021= 14.88 sec    Time 12    Period Weeks    Status On-going    Target Date 07/15/21      PT LONG TERM GOAL #5   Title Pt will increase 10MWT by at least 0.13 m/s in order to demonstrate clinically significant improvement in community ambulation.    Baseline 01/27/2021= 0.67 m/s without an AD.  9/14= 0.75 m/s; 04/22/2021= 0.82 sec without an AD- WIll keep goal active to ensure patient can perform consistently. 05/13/2021= 0.89 m/s    Time 12    Period Weeks    Status Partially Met    Target Date 07/15/21      PT LONG TERM GOAL #6   Title Pt will increase 6MWT by at least 76m(1650f in order to demonstrate clinically significant improvement in cardiopulmonary endurance and community  ambulation    Baseline 02/03/2021= 1100 feet; 03/18/21= 1125 feet without AD.  04/22/2021= 1175 feet without AD    Time 11    Period Weeks    Status On-going    Target Date 07/15/21                   Plan - 07/08/21 0914     Clinical Impression Statement Pt demonstrates excellent motivation throughout PT session; wife encouraging. Continued focus of POC on LE strengthening and endurance with progressions in weight and reps. Stability improved with both lunging and step ups. He does continue to have difficulty with coordination and sequencing however this did improve with reps and decreased VC on sequencing - PT allowing pt to internally process, make mistakes and self-correct. Agility ladder was added to end of session with added focus of speed. Pt may benefit from metronome for pacing. Pt will benefit from skilled physical therapy to increase mobility and strength to improve patient's quality of life.    Personal Factors and Comorbidities Comorbidity 3+    Comorbidities Anoxic brain injury (1999), Cardiac (pacemaker/debrillator), HTN    Examination-Activity Limitations Bathing;Caring for Others;Lift;Stairs;Transfers    Examination-Participation Restrictions Community Activity;Yard Work    Stability/Clinical Decision Making Stable/Uncomplicated    Rehab Potential Good    PT Frequency 1x / week    PT Duration 12 weeks    PT Treatment/Interventions ADLs/Self Care Home Management;Cryotherapy;Moist Heat;DME Instruction;Gait training;Stair training;Functional mobility training;Therapeutic activities;Therapeutic exercise;Balance training;Neuromuscular re-education;Patient/family education;Manual techniques;Passive range of motion;Dry needling;Canalith Repostioning;Ultrasound;Vestibular;Joint Manipulations    PT Next Visit Plan Progress LE strengthening next visit and continue with  balance exercises.    PT Home Exercise Plan No changes this visit  Consulted and Agree with Plan of Care  Patient;Family member/caregiver    Family Member Consulted Wife- [name]             Patient will benefit from skilled therapeutic intervention in order to improve the following deficits and impairments:  Abnormal gait, Decreased activity tolerance, Decreased balance, Decreased cognition, Decreased coordination, Decreased endurance, Decreased mobility, Decreased strength, Difficulty walking  Visit Diagnosis: Abnormality of gait and mobility  Difficulty in walking, not elsewhere classified  Muscle weakness (generalized)  Unsteadiness on feet  Other lack of coordination  Repeated falls     Problem List Patient Active Problem List   Diagnosis Date Noted   Neck pain 07/02/2021   Gait abnormality 12/16/2020   Nonintractable headache 09/30/2020   Tinnitus 09/09/2019   History of sudden cardiac arrest 01/09/2019   Viral syndrome 09/06/2018   Seizures ([place]) 03/02/2018   Syncope 12/26/2017   Other social stressor 12/26/2017   Health care maintenance 08/14/2017   Family hx of prostate cancer    Allergy    Insomnia 10/20/2015   Advance care planning 03/08/2014   Memory loss 09/14/2013   Hypoxic brain injury ([place]) 09/14/2013   Medicare annual wellness visit, subsequent 02/16/2013   Shortness of breath 08/10/2011   Implantable cardioverter-defibrillator (ICD) in situ 02/03/2009   Hypothyroidism 11/18/2008   CONTACT DERMATITIS&OTHER ECZEMA DUE TO PLANTS 02/03/2008   IDIOPATHIC URTICARIA 02/03/2008   Hypertension 02/03/2008   HLD (hyperlipidemia) Nov 18, 2007   Attention deficit disorder 11/18/2007   PVC/VT non sustained 2007-11-18   Sleep apnea Nov 18, 2007   HEADACHE 2007/11/18   SUDDEN DEATH-aborted 2007/11/18   LOW BACK PAIN SYNDROME 10/11/2007    Patrina Levering PT, DPT   [place] [facility] MAIN [facility] SERVICES [address] [place], [place], [address] Phone: [phone]   Fax:  [phone]  Name: Larry [name] MRN: [id number] Date of  Birth: 11-06-1950

## 2021-07-10 ENCOUNTER — Ambulatory Visit (INDEPENDENT_AMBULATORY_CARE_PROVIDER_SITE_OTHER): Payer: Medicare Other

## 2021-07-10 DIAGNOSIS — I4729 Other ventricular tachycardia: Secondary | ICD-10-CM

## 2021-07-13 LAB — CUP PACEART REMOTE DEVICE CHECK
Battery Remaining Longevity: 30 mo
Battery Remaining Percentage: 33 %
Brady Statistic RV Percent Paced: 1 %
Date Time Interrogation Session: 20230106032100
HighPow Impedance: 67 Ohm
Implantable Lead Implant Date: 19990204
Implantable Lead Location: 753860
Implantable Lead Model: 135
Implantable Lead Serial Number: 301213
Implantable Pulse Generator Implant Date: 20111228
Lead Channel Impedance Value: 777 Ohm
Lead Channel Pacing Threshold Amplitude: 0.9 V
Lead Channel Pacing Threshold Pulse Width: 1 ms
Lead Channel Setting Pacing Amplitude: 2.4 V
Lead Channel Setting Pacing Pulse Width: 1 ms
Lead Channel Setting Sensing Sensitivity: 0.4 mV
Pulse Gen Serial Number: 277857

## 2021-07-15 ENCOUNTER — Other Ambulatory Visit: Payer: Self-pay

## 2021-07-15 ENCOUNTER — Ambulatory Visit: Payer: Medicare Other

## 2021-07-15 DIAGNOSIS — R296 Repeated falls: Secondary | ICD-10-CM | POA: Diagnosis not present

## 2021-07-15 DIAGNOSIS — R262 Difficulty in walking, not elsewhere classified: Secondary | ICD-10-CM

## 2021-07-15 DIAGNOSIS — R2681 Unsteadiness on feet: Secondary | ICD-10-CM | POA: Diagnosis not present

## 2021-07-15 DIAGNOSIS — R269 Unspecified abnormalities of gait and mobility: Secondary | ICD-10-CM | POA: Diagnosis not present

## 2021-07-15 DIAGNOSIS — R278 Other lack of coordination: Secondary | ICD-10-CM | POA: Diagnosis not present

## 2021-07-15 DIAGNOSIS — M6281 Muscle weakness (generalized): Secondary | ICD-10-CM

## 2021-07-15 NOTE — Therapy (Signed)
[place] MAIN [facility] SERVICES [address] [place], [place], [address] Phone: [phone]   Fax:  [phone]  Physical Therapy Treatment/Recertification for dates 07/15/2021-10/07/2021  Patient Details  Name: Larry [name] MRN: [id number] Date of Birth: [birth year] Referring Provider (PT): Dr. Krista Blue   Encounter Date: 07/15/2021   PT End of Session - 07/15/21 1406     Visit Number 27    Number of Visits 39    Date for PT Re-Evaluation 10/07/21    Authorization Type UHC Medicare    Authorization Time Period 01/27/2021- 97/35/3299; Recert 24/26/8341- 96/22/2979; 07/15/2021-10/07/2021    PT Start Time 8921    PT Stop Time 1429    PT Time Calculation (min) 26 min    Equipment Utilized During Treatment Gait belt    Activity Tolerance Patient tolerated treatment well    Behavior During Therapy [facility] for tasks assessed/performed             Past Medical History:  Diagnosis Date   ADD (attention deficit disorder)    Allergy    seasonal flares of perinneal allergies   Cancer ([place])    melanoma   Cardiac arrest ([place]) 07/1997   aborted   Dementia ([place])    post resusitative   Depression    Diverticulosis    Family hx of prostate cancer    GERD (gastroesophageal reflux disease)    Headache(784.0)    Hyperlipidemia    Hypothyroidism    ICD (implantable cardiac defibrillator) in place    Idiopathic urticaria    Internal hemorrhoids    Low back pain syndrome    Melanoma in situ of skin of trunk ([place])    chest   PVC (premature ventricular contraction)    associated with cardiac arrest   Sleep apnea    no c-pap   Tubular adenoma of colon 10/2010    Past Surgical History:  Procedure Laterality Date    implantation x2     CARDIAC DEFIBRILLATOR PLACEMENT  07/01/2010   Explanation of a previously implanted device, pocket revision, and insertion of a new device, and intraoperative defibrillation threshold testing Caryl Comes)   CARDIAC  DEFIBRILLATOR REMOVAL  2004   Defibrillator change Caryl Comes) 3 times changed   CATARACT EXTRACTION W/PHACO Left 02/04/2021   Procedure: CATARACT EXTRACTION PHACO AND INTRAOCULAR LENS PLACEMENT ([place]) LEFT VIVITY toric LENS 9.21 01:11.6;  Surgeon: Leandrew Koyanagi, MD;  Location: [place];  Service: Ophthalmology;  Laterality: Left;  keep patient at 11:30 arrival   CATARACT EXTRACTION W/PHACO Right 03/04/2021   Procedure: CATARACT EXTRACTION PHACO AND INTRAOCULAR LENS PLACEMENT ([place]) RIGHT VIVITY LENS 8.59 01:15.3;  Surgeon: Leandrew Koyanagi, MD;  Location: [place];  Service: Ophthalmology;  Laterality: Right;   HEMORRHOID SURGERY     ICD     TONSILLECTOMY      There were no vitals filed for this visit.   Subjective Assessment - 07/15/21 1411     Subjective Patient reports falling at home not because of loss of balance but floor was wet and he didn't realize- He denies any increase in pain.    Patient is accompained by: Family member    Pertinent History Per note submitted by Dr. Krista Blue on 12/15/2020- The patient has an underlying medical history of cardiac death from ventricular fibrillation with hypoxic brain injury in Jan 1999 due to arrythmia, 15 minutes without heart beat,  status post defibrillator placement, He stayed in hospital for 35 days. On 12/16/2020-Gait abnormality, Also related to  the previous anoxic brain injury, deconditioning,    Limitations Walking;House hold activities    How long can you sit comfortably? no limitations    How long can you stand comfortably? Not really limited    How long can you walk comfortably? 15-20    Patient Stated Goals To improve transfers, balance and walking.    Currently in Pain? No/denies    Pain Onset More than a month ago            INTERVENTIONS:   Treatment limited secondary to patient arrived 18 min late.  Reassessed all LTGs      TUG= 1st= 14.6 sec  and 2nd= 14.75 sec (GOAL MET)    10 MWT= 10.2 sec  without AD= 0.98 m/s (GOAL MET) - Will add new goal to continue to progress functional mobility.   5xSTS= 17.43 sec without UE support   6 min walk test= 1210 feet without AD  RPE= challenging 6/10 O2 sat=97% and HR=78 bpm (GOAL ONGOING)           PT Education - 07/16/21 1535     Education Details Exercise technique    Person(s) Educated Patient    Methods Explanation;Demonstration;Tactile cues;Verbal cues    Comprehension Verbalized understanding;Returned demonstration;Verbal cues required;Tactile cues required;Need further instruction              PT Short Term Goals - 03/18/21 1306       PT SHORT TERM GOAL #1   Title Pt will be independent with HEP in order to improve strength and balance in order to decrease fall risk and improve function at home and work.    Baseline 01/27/2021- Patient presents with no formal HEP in place. 03/18/2021- Patient is knowledgeable of HEP- WIfe reports he is not always compliant but not because I don't know what to do."    Time 6    Period Weeks    Status Achieved    Target Date 03/10/21               PT Long Term Goals - 07/15/21 1408       PT LONG TERM GOAL #1   Title Pt will improve BERG by at least 5 points in order to demonstrate clinically significant improvement in balance.    Baseline 01/27/2021= 43/56; 03/18/2021=50/56    Time 12    Period Weeks    Status Achieved      PT LONG TERM GOAL #2   Title Pt will improve FOTO to target score of 55% to display perceived improvements in ability to complete ADL's.    Baseline 01/27/2021= 42%; 04/22/2021- Will assess next visit; 05/06/2021= 64%    Time 12    Period Weeks    Status Achieved      PT LONG TERM GOAL #3   Title Pt will decrease 5TSTS by at least 3 seconds in order to demonstrate clinically significant improvement in LE strength.    Baseline 01/27/2021= 30.07 without UE support; 03/18/2021= 24.78 sec without UE Support; 04/22/2021= 14.47 sec without UE support;  07/15/2021- 17.43 sec without UE support    Time 12    Period Weeks    Status Achieved      PT LONG TERM GOAL #4   Title Pt will decrease TUG to below 15  seconds/decrease in order to demonstrate decreased fall risk.    Baseline 01/27/2021= 20.97 without an AD; 03/18/2021= 19.2 sec without AD; 04/22/2021= 17 sec without AD; 05/13/2021= 14.88 sec. 07/15/2021= 14.7 sec  avg without AD    Time 12    Period Weeks    Status Achieved    Target Date 07/15/21      PT LONG TERM GOAL #5   Title Pt will increase 10MWT by at least 0.13 m/s in order to demonstrate clinically significant improvement in community ambulation.    Baseline 01/27/2021= 0.67 m/s without an AD.  9/14= 0.75 m/s; 04/22/2021= 0.82 sec without an AD- WIll keep goal active to ensure patient can perform consistently. 05/13/2021= 0.89 m/s. 07/15/2021= 0.98 m/s    Time 12    Period Weeks    Status Achieved    Target Date 07/15/21      Additional Long Term Goals   Additional Long Term Goals Yes      PT LONG TERM GOAL #6   Title Pt will increase 6MWT by at least 63m(1648f in order to demonstrate clinically significant improvement in cardiopulmonary endurance and community ambulation    Baseline 02/03/2021= 1100 feet; 03/18/21= 1125 feet without AD.  04/22/2021= 1175 feet without AD 07/15/2021= 1210 feet without an AD    Time 11    Period Weeks    Status On-going    Target Date 10/07/21      PT LONG TERM GOAL #7   Title Pt will increase 10MWT to 1.0 m/s or greater in order to demonstrate clinically significant improvement in community ambulation.    Baseline 07/15/2021= 0.98 m/s    Time 12    Period Weeks    Status New    Target Date 10/07/21                   Plan - 07/15/21 1536     Clinical Impression Statement Patient has progressed well with all goals as seen by improvement in gait velocity, TUG and 6 min walk test. He continues to present with some overall weakness, difficulty with coordination and still at risk of  falling and will benefit from continued PT services. Patient's condition has the potential to improve in response to therapy. Maximum improvement is yet to be obtained. The anticipated improvement is attainable and reasonable in a generally predictable time.  Pt will benefit from skilled physical therapy to increase mobility and strength to improve patient's quality of life    Personal Factors and Comorbidities Comorbidity 3+    Comorbidities Anoxic brain injury (1999), Cardiac (pacemaker/debrillator), HTN    Examination-Activity Limitations Bathing;Caring for Others;Lift;Stairs;Transfers    Examination-Participation Restrictions Community Activity;Yard Work    Stability/Clinical Decision Making Stable/Uncomplicated    Rehab Potential Good    PT Frequency 1x / week    PT Duration 12 weeks    PT Treatment/Interventions ADLs/Self Care Home Management;Cryotherapy;Moist Heat;DME Instruction;Gait training;Stair training;Functional mobility training;Therapeutic activities;Therapeutic exercise;Balance training;Neuromuscular re-education;Patient/family education;Manual techniques;Passive range of motion;Dry needling;Canalith Repostioning;Ultrasound;Vestibular;Joint Manipulations    PT Next Visit Plan Progress LE strengthening next visit and continue with  balance exercises.    PT Home Exercise Plan No changes this visit    Consulted and Agree with Plan of Care Patient;Family member/caregiver    Family Member Consulted Wife- KaJuliann Pulse           Patient will benefit from skilled therapeutic intervention in order to improve the following deficits and impairments:  Abnormal gait, Decreased activity tolerance, Decreased balance, Decreased cognition, Decreased coordination, Decreased endurance, Decreased mobility, Decreased strength, Difficulty walking  Visit Diagnosis: Abnormality of gait and mobility  Difficulty in walking, not elsewhere classified  Muscle weakness (generalized)  Unsteadiness on  feet     Problem List Patient Active Problem List   Diagnosis Date Noted   Neck pain 07/02/2021   Gait abnormality 12/16/2020   Nonintractable headache 09/30/2020   Tinnitus 09/09/2019   History of sudden cardiac arrest 01/09/2019   Viral syndrome 09/06/2018   Seizures ([place]) 03/02/2018   Syncope 12/26/2017   Other social stressor 12/26/2017   Health care maintenance 08/14/2017   Family hx of prostate cancer    Allergy    Insomnia 10/20/2015   Advance care planning 03/08/2014   Memory loss 09/14/2013   Hypoxic brain injury ([place]) 09/14/2013   Medicare annual wellness visit, subsequent 02/16/2013   Shortness of breath 08/10/2011   Implantable cardioverter-defibrillator (ICD) in situ 02/03/2009   Hypothyroidism 11/18/2008   CONTACT DERMATITIS&OTHER ECZEMA DUE TO PLANTS 02/03/2008   IDIOPATHIC URTICARIA 02/03/2008   Hypertension 02/03/2008   HLD (hyperlipidemia) 2007/10/28   Attention deficit disorder 2007/10/28   PVC/VT non sustained 2007-10-28   Sleep apnea 2007/10/28   HEADACHE 2007/10/28   SUDDEN DEATH-aborted 2007-10-28   LOW BACK PAIN SYNDROME 10/11/2007    Lewis Moccasin, PT 07/16/2021, 5:08 PM  [place] MAIN [facility] SERVICES [address] [place], [place], [address] Phone: [phone]   Fax:  [phone]  Name: Larry [name] MRN: [id number] Date of Birth: [birth year]

## 2021-07-16 ENCOUNTER — Ambulatory Visit
Admission: RE | Admit: 2021-07-16 | Discharge: 2021-07-16 | Disposition: A | Discharge status: Home / Self Care | Payer: Medicare Other | Source: Ambulatory Visit | Attending: Neurology | Admitting: Neurology

## 2021-07-16 DIAGNOSIS — R413 Other amnesia: Secondary | ICD-10-CM

## 2021-07-16 DIAGNOSIS — G931 Anoxic brain damage, not elsewhere classified: Secondary | ICD-10-CM

## 2021-07-16 DIAGNOSIS — M542 Cervicalgia: Secondary | ICD-10-CM

## 2021-07-16 DIAGNOSIS — R569 Unspecified convulsions: Secondary | ICD-10-CM

## 2021-07-21 NOTE — Progress Notes (Signed)
Remote ICD transmission.   

## 2021-07-22 ENCOUNTER — Ambulatory Visit: Payer: Medicare Other

## 2021-07-22 ENCOUNTER — Other Ambulatory Visit: Payer: Self-pay

## 2021-07-22 DIAGNOSIS — R262 Difficulty in walking, not elsewhere classified: Secondary | ICD-10-CM

## 2021-07-22 DIAGNOSIS — R2681 Unsteadiness on feet: Secondary | ICD-10-CM

## 2021-07-22 DIAGNOSIS — R296 Repeated falls: Secondary | ICD-10-CM | POA: Diagnosis not present

## 2021-07-22 DIAGNOSIS — M6281 Muscle weakness (generalized): Secondary | ICD-10-CM | POA: Diagnosis not present

## 2021-07-22 DIAGNOSIS — R269 Unspecified abnormalities of gait and mobility: Secondary | ICD-10-CM | POA: Diagnosis not present

## 2021-07-22 DIAGNOSIS — R278 Other lack of coordination: Secondary | ICD-10-CM | POA: Diagnosis not present

## 2021-07-22 NOTE — Therapy (Signed)
[place] MAIN [facility] SERVICES [address] [place], [place], [address] Phone: [phone]   Fax:  [phone]  Physical Therapy Treatment  Patient Details  Name: Larry [name] MRN: [id number] Date of Birth: [birth year] Referring Provider (PT): Dr. Krista Blue   Encounter Date: 07/22/2021   PT End of Session - 07/22/21 1442     Visit Number 28    Number of Visits 39    Date for PT Re-Evaluation 10/07/21    Authorization Type UHC Medicare    Authorization Time Period 01/27/2021- 35/36/1443; Recert 15/40/0867- 61/95/0932; 07/15/2021-10/07/2021    PT Start Time 1430    PT Stop Time 1511    PT Time Calculation (min) 41 min    Equipment Utilized During Treatment Gait belt    Activity Tolerance Patient tolerated treatment well    Behavior During Therapy [facility] for tasks assessed/performed             Past Medical History:  Diagnosis Date   ADD (attention deficit disorder)    Allergy    seasonal flares of perinneal allergies   Cancer ([place])    melanoma   Cardiac arrest ([place]) 07/1997   aborted   Dementia ([place])    post resusitative   Depression    Diverticulosis    Family hx of prostate cancer    GERD (gastroesophageal reflux disease)    Headache(784.0)    Hyperlipidemia    Hypothyroidism    ICD (implantable cardiac defibrillator) in place    Idiopathic urticaria    Internal hemorrhoids    Low back pain syndrome    Melanoma in situ of skin of trunk ([place])    chest   PVC (premature ventricular contraction)    associated with cardiac arrest   Sleep apnea    no c-pap   Tubular adenoma of colon 10/2010    Past Surgical History:  Procedure Laterality Date    implantation x2     CARDIAC DEFIBRILLATOR PLACEMENT  07/01/2010   Explanation of a previously implanted device, pocket revision, and insertion of a new device, and intraoperative defibrillation threshold testing Caryl Comes)   CARDIAC DEFIBRILLATOR REMOVAL  2004   Defibrillator change  Caryl Comes) 3 times changed   CATARACT EXTRACTION W/PHACO Left 02/04/2021   Procedure: CATARACT EXTRACTION PHACO AND INTRAOCULAR LENS PLACEMENT ([place]) LEFT VIVITY toric LENS 9.21 01:11.6;  Surgeon: Leandrew Koyanagi, MD;  Location: [place];  Service: Ophthalmology;  Laterality: Left;  keep patient at 11:30 arrival   CATARACT EXTRACTION W/PHACO Right 03/04/2021   Procedure: CATARACT EXTRACTION PHACO AND INTRAOCULAR LENS PLACEMENT ([place]) RIGHT VIVITY LENS 8.59 01:15.3;  Surgeon: Leandrew Koyanagi, MD;  Location: [place];  Service: Ophthalmology;  Laterality: Right;   HEMORRHOID SURGERY     ICD     TONSILLECTOMY      There were no vitals filed for this visit.   Subjective Assessment - 07/22/21 1439     Subjective Patient reports he received the results of his x-ray of his neck- DDD on cervical region    Patient is accompained by: Family member    Pertinent History Per note submitted by Dr. Krista Blue on 12/15/2020- The patient has an underlying medical history of cardiac death from ventricular fibrillation with hypoxic brain injury in Jan 1999 due to arrythmia, 15 minutes without heart beat,  status post defibrillator placement, He stayed in hospital for 35 days. On 12/16/2020-Gait abnormality, Also related to the previous anoxic brain injury, deconditioning,    Limitations Walking;House hold  activities    How long can you sit comfortably? no limitations    How long can you stand comfortably? Not really limited    How long can you walk comfortably? 15-20    Patient Stated Goals To improve transfers, balance and walking.    Currently in Pain? No/denies    Pain Onset More than a month ago            Made a grocery list with patient involving  5 common items:   Patient chose  - potatoes  -Carrots - Toilet paper  - Milk - [employer]  *list to be used later in session  Cognitive TUG tested today- 14.25 normal TUG without Device and then asked to count backward  by 3 starting  at 66= Patient required 16.78 sec and only able to count backward to 57.  Dual Tasking activities: TUG- while recalling 5 items from grocery list= 22 sec (4 items remembered)   2nd trial= 21 sec (4 items remembered)     Side steppping on [employer] TM at 0.7 mph x 2 min to left and 2 min to right.  Placed 6 hedgehogs along singular path (approx 3 feet apart) with instructed to walk-weave around - patient able to perform in 30 sec.  Added dual task activities including - walking while tossing ball from left to right hand- 36 sec  Dual task cost [(36-30)/30] x 100= 20%   -walking and calling out months of year- 35 sec Dual task cost [(35-30)/30] x 100= 16.7%      Education provided throughout session via VC/TC and demonstration to facilitate movement at target joints and correct muscle activation for all testing and exercises performed.                      PT Education - 07/22/21 1441     Education Details dual task activities    Person(s) Educated Patient    Methods Explanation;Demonstration;Tactile cues;Verbal cues    Comprehension Verbalized understanding;Returned demonstration;Verbal cues required;Need further instruction              PT Short Term Goals - 03/18/21 1306       PT SHORT TERM GOAL #1   Title Pt will be independent with HEP in order to improve strength and balance in order to decrease fall risk and improve function at home and work.    Baseline 01/27/2021- Patient presents with no formal HEP in place. 03/18/2021- Patient is knowledgeable of HEP- WIfe reports he is not always compliant but not because I don't know what to do."    Time 6    Period Weeks    Status Achieved    Target Date 03/10/21               PT Long Term Goals - 07/15/21 1408       PT LONG TERM GOAL #1   Title Pt will improve BERG by at least 5 points in order to demonstrate clinically significant improvement in balance.    Baseline 01/27/2021= 43/56;  03/18/2021=50/56    Time 12    Period Weeks    Status Achieved      PT LONG TERM GOAL #2   Title Pt will improve FOTO to target score of 55% to display perceived improvements in ability to complete ADL's.    Baseline 01/27/2021= 42%; 04/22/2021- Will assess next visit; 05/06/2021= 64%    Time 12    Period Weeks    Status Achieved  PT LONG TERM GOAL #3   Title Pt will decrease 5TSTS by at least 3 seconds in order to demonstrate clinically significant improvement in LE strength.    Baseline 01/27/2021= 30.07 without UE support; 03/18/2021= 24.78 sec without UE Support; 04/22/2021= 14.47 sec without UE support; 07/15/2021- 17.43 sec without UE support    Time 12    Period Weeks    Status Achieved      PT LONG TERM GOAL #4   Title Pt will decrease TUG to below 15  seconds/decrease in order to demonstrate decreased fall risk.    Baseline 01/27/2021= 20.97 without an AD; 03/18/2021= 19.2 sec without AD; 04/22/2021= 17 sec without AD; 05/13/2021= 14.88 sec. 07/15/2021= 14.7 sec avg without AD    Time 12    Period Weeks    Status Achieved    Target Date 07/15/21      PT LONG TERM GOAL #5   Title Pt will increase 10MWT by at least 0.13 m/s in order to demonstrate clinically significant improvement in community ambulation.    Baseline 01/27/2021= 0.67 m/s without an AD.  9/14= 0.75 m/s; 04/22/2021= 0.82 sec without an AD- WIll keep goal active to ensure patient can perform consistently. 05/13/2021= 0.89 m/s. 07/15/2021= 0.98 m/s    Time 12    Period Weeks    Status Achieved    Target Date 07/15/21      Additional Long Term Goals   Additional Long Term Goals Yes      PT LONG TERM GOAL #6   Title Pt will increase 6MWT by at least 89m (148ft) in order to demonstrate clinically significant improvement in cardiopulmonary endurance and community ambulation    Baseline 02/03/2021= 1100 feet; 03/18/21= 1125 feet without AD.  04/22/2021= 1175 feet without AD 07/15/2021= 1210 feet without an AD    Time 11     Period Weeks    Status On-going    Target Date 10/07/21      PT LONG TERM GOAL #7   Title Pt will increase 10MWT to 1.0 m/s or greater in order to demonstrate clinically significant improvement in community ambulation.    Baseline 07/15/2021= 0.98 m/s    Time 12    Period Weeks    Status New    Target Date 10/07/21                   Plan - 07/22/21 1442     Clinical Impression Statement Today focused more on dual task activities and the more complex the physical or mental task- the more dual task cost and more timed required to perform primary activity as seen throughout session today. Patient was frustrated more with cognitive task yet responded well with cues overall today. He will benefit from continuing with dual task activities as well other PT interventions to improve his strength, coordination/balance, and mobility for improved quality of life    Personal Factors and Comorbidities Comorbidity 3+    Comorbidities Anoxic brain injury (1999), Cardiac (pacemaker/debrillator), HTN    Examination-Activity Limitations Bathing;Caring for Others;Lift;Stairs;Transfers    Examination-Participation Restrictions Community Activity;Yard Work    Stability/Clinical Decision Making Stable/Uncomplicated    Rehab Potential Good    PT Frequency 1x / week    PT Duration 12 weeks    PT Treatment/Interventions ADLs/Self Care Home Management;Cryotherapy;Moist Heat;DME Instruction;Gait training;Stair training;Functional mobility training;Therapeutic activities;Therapeutic exercise;Balance training;Neuromuscular re-education;Patient/family education;Manual techniques;Passive range of motion;Dry needling;Canalith Repostioning;Ultrasound;Vestibular;Joint Manipulations    PT Next Visit Plan Progress LE strengthening next visit and  continue with  balance exercises.    PT Home Exercise Plan No changes this visit    Consulted and Agree with Plan of Care Patient;Family member/caregiver    Family Member  Consulted Wife- [name]             Patient will benefit from skilled therapeutic intervention in order to improve the following deficits and impairments:  Abnormal gait, Decreased activity tolerance, Decreased balance, Decreased cognition, Decreased coordination, Decreased endurance, Decreased mobility, Decreased strength, Difficulty walking  Visit Diagnosis: Abnormality of gait and mobility  Difficulty in walking, not elsewhere classified  Muscle weakness (generalized)  Unsteadiness on feet     Problem List Patient Active Problem List   Diagnosis Date Noted   Neck pain 07/02/2021   Gait abnormality 12/16/2020   Nonintractable headache 09/30/2020   Tinnitus 09/09/2019   History of sudden cardiac arrest 01/09/2019   Viral syndrome 09/06/2018   Seizures ([place]) 03/02/2018   Syncope 12/26/2017   Other social stressor 12/26/2017   Health care maintenance 08/14/2017   Family hx of prostate cancer    Allergy    Insomnia 10/20/2015   Advance care planning 03/08/2014   Memory loss 09/14/2013   Hypoxic brain injury ([place]) 09/14/2013   Medicare annual wellness visit, subsequent 02/16/2013   Shortness of breath 08/10/2011   Implantable cardioverter-defibrillator (ICD) in situ 02/03/2009   Hypothyroidism 11/18/2008   CONTACT DERMATITIS&OTHER ECZEMA DUE TO PLANTS 02/03/2008   IDIOPATHIC URTICARIA 02/03/2008   Hypertension 02/03/2008   HLD (hyperlipidemia) 11/15/07   Attention deficit disorder 11-15-2007   PVC/VT non sustained 2007-11-15   Sleep apnea Nov 15, 2007   HEADACHE Nov 15, 2007   SUDDEN DEATH-aborted 11/15/2007   LOW BACK PAIN SYNDROME 10/11/2007    Lewis Moccasin, PT 07/22/2021, 3:45 PM  [place] MAIN [facility] SERVICES [address] [place], [place], [address] Phone: [phone]   Fax:  [phone]  Name: RAGNAR WAAS MRN: [id number] Date of Birth: [birth year]

## 2021-07-29 ENCOUNTER — Ambulatory Visit: Payer: Medicare Other | Admitting: Physical Therapy

## 2021-07-29 ENCOUNTER — Other Ambulatory Visit: Payer: Self-pay

## 2021-07-29 DIAGNOSIS — R2681 Unsteadiness on feet: Secondary | ICD-10-CM

## 2021-07-29 DIAGNOSIS — R296 Repeated falls: Secondary | ICD-10-CM | POA: Diagnosis not present

## 2021-07-29 DIAGNOSIS — R269 Unspecified abnormalities of gait and mobility: Secondary | ICD-10-CM

## 2021-07-29 DIAGNOSIS — M6281 Muscle weakness (generalized): Secondary | ICD-10-CM | POA: Diagnosis not present

## 2021-07-29 DIAGNOSIS — R262 Difficulty in walking, not elsewhere classified: Secondary | ICD-10-CM

## 2021-07-29 DIAGNOSIS — R278 Other lack of coordination: Secondary | ICD-10-CM | POA: Diagnosis not present

## 2021-07-29 NOTE — Therapy (Signed)
[place] MAIN [facility] SERVICES [address] [place], [place], [address] Phone: [phone]   Fax:  [phone]  Physical Therapy Treatment  Patient Details  Name: Larry [name] MRN: [id number] Date of Birth: 16-Oct-1950 Referring Provider (PT): Dr. Krista Blue   Encounter Date: 07/29/2021   PT End of Session - 07/29/21 1353     Visit Number 29    Number of Visits 37    Date for PT Re-Evaluation 10/07/21    Authorization Type UHC Medicare    Authorization Time Period 01/27/2021- 09/32/6712; Recert 45/80/9983- 38/25/0539; 07/15/2021-10/07/2021    PT Start Time 1349    PT Stop Time 1433    PT Time Calculation (min) 44 min    Equipment Utilized During Treatment Gait belt    Activity Tolerance Patient tolerated treatment well    Behavior During Therapy WFL for tasks assessed/performed             Past Medical History:  Diagnosis Date   ADD (attention deficit disorder)    Allergy    seasonal flares of perinneal allergies   Cancer ([place])    melanoma   Cardiac arrest ([place]) 07/1997   aborted   Dementia ([place])    post resusitative   Depression    Diverticulosis    Family hx of prostate cancer    GERD (gastroesophageal reflux disease)    Headache(784.0)    Hyperlipidemia    Hypothyroidism    ICD (implantable cardiac defibrillator) in place    Idiopathic urticaria    Internal hemorrhoids    Low back pain syndrome    Melanoma in situ of skin of trunk ([place])    chest   PVC (premature ventricular contraction)    associated with cardiac arrest   Sleep apnea    no c-pap   Tubular adenoma of colon 10/2010    Past Surgical History:  Procedure Laterality Date    implantation x2     CARDIAC DEFIBRILLATOR PLACEMENT  07/01/2010   Explanation of a previously implanted device, pocket revision, and insertion of a new device, and intraoperative defibrillation threshold testing Caryl Comes)   CARDIAC DEFIBRILLATOR REMOVAL  2004   Defibrillator change  Caryl Comes) 3 times changed   CATARACT EXTRACTION W/PHACO Left 02/04/2021   Procedure: CATARACT EXTRACTION PHACO AND INTRAOCULAR LENS PLACEMENT ([place]) LEFT VIVITY toric LENS 9.21 01:11.6;  Surgeon: Leandrew Koyanagi, MD;  Location: [place];  Service: Ophthalmology;  Laterality: Left;  keep patient at 11:30 arrival   CATARACT EXTRACTION W/PHACO Right 03/04/2021   Procedure: CATARACT EXTRACTION PHACO AND INTRAOCULAR LENS PLACEMENT ([place]) RIGHT VIVITY LENS 8.59 01:15.3;  Surgeon: Leandrew Koyanagi, MD;  Location: [place];  Service: Ophthalmology;  Laterality: Right;   HEMORRHOID SURGERY     ICD     TONSILLECTOMY      There were no vitals filed for this visit.   Subjective Assessment - 07/29/21 1352     Subjective Pt reports no significant changes since last session.    Patient is accompained by: Family member    Pertinent History Per note submitted by Dr. Krista Blue on 12/15/2020- The patient has an underlying medical history of cardiac death from ventricular fibrillation with hypoxic brain injury in Jan 1999 due to arrythmia, 15 minutes without heart beat,  status post defibrillator placement, He stayed in hospital for 35 days. On 12/16/2020-Gait abnormality, Also related to the previous anoxic brain injury, deconditioning,    Limitations Walking;House hold activities    How long can you  sit comfortably? no limitations    How long can you stand comfortably? Not really limited    How long can you walk comfortably? 15-20    Patient Stated Goals To improve transfers, balance and walking.    Currently in Pain? No/denies    Pain Onset More than a month ago             Exercise/Activity Sets/Reps/Time/ Resistance Assistance Charge type Comments  Airex dual task balance game.  3 x  CGA  Neuro  [employer]   Balance obstacle course  2 x through  CGA Neuro  Dual task of balance course and finding cones located around clinic   STS with airex under feet  2 x 10  CGA  Neuro  With  second set incorporated dual task (motor and cognitive) naming basketball players from memory as best as he was able  Gait training with other tasks X 600 feet  CGA  THER act Head turns and directions changes throughout, with head turns pt had difficulty maintaining pace.                                             Treatment Provided this session   Pt educated throughout session about proper posture and technique with exercises. Improved exercise technique, movement at target joints, use of target muscles after min to mod verbal, visual, tactile cues. Note: Portions of this document were prepared using Dragon voice recognition software and although reviewed may contain unintentional dictation errors in syntax, grammar, or spelling.                            PT Education - 07/29/21 1352     Education Details Exercise form and technique    Person(s) Educated Patient    Methods Explanation;Demonstration;Tactile cues    Comprehension Verbalized understanding;Returned demonstration;Verbal cues required              PT Short Term Goals - 03/18/21 1306       PT SHORT TERM GOAL #1   Title Pt will be independent with HEP in order to improve strength and balance in order to decrease fall risk and improve function at home and work.    Baseline 01/27/2021- Patient presents with no formal HEP in place. 03/18/2021- Patient is knowledgeable of HEP- WIfe reports he is not always compliant but not because I don't know what to do."    Time 6    Period Weeks    Status Achieved    Target Date 03/10/21               PT Long Term Goals - 07/15/21 1408       PT LONG TERM GOAL #1   Title Pt will improve BERG by at least 5 points in order to demonstrate clinically significant improvement in balance.    Baseline 01/27/2021= 43/56; 03/18/2021=50/56    Time 12    Period Weeks    Status Achieved      PT LONG TERM GOAL #2   Title Pt will improve FOTO to target score of  55% to display perceived improvements in ability to complete ADL's.    Baseline 01/27/2021= 42%; 04/22/2021- Will assess next visit; 05/06/2021= 64%    Time 12    Period Weeks    Status Achieved  PT LONG TERM GOAL #3   Title Pt will decrease 5TSTS by at least 3 seconds in order to demonstrate clinically significant improvement in LE strength.    Baseline 01/27/2021= 30.07 without UE support; 03/18/2021= 24.78 sec without UE Support; 04/22/2021= 14.47 sec without UE support; 07/15/2021- 17.43 sec without UE support    Time 12    Period Weeks    Status Achieved      PT LONG TERM GOAL #4   Title Pt will decrease TUG to below 15  seconds/decrease in order to demonstrate decreased fall risk.    Baseline 01/27/2021= 20.97 without an AD; 03/18/2021= 19.2 sec without AD; 04/22/2021= 17 sec without AD; 05/13/2021= 14.88 sec. 07/15/2021= 14.7 sec avg without AD    Time 12    Period Weeks    Status Achieved    Target Date 07/15/21      PT LONG TERM GOAL #5   Title Pt will increase 10MWT by at least 0.13 m/s in order to demonstrate clinically significant improvement in community ambulation.    Baseline 01/27/2021= 0.67 m/s without an AD.  9/14= 0.75 m/s; 04/22/2021= 0.82 sec without an AD- WIll keep goal active to ensure patient can perform consistently. 05/13/2021= 0.89 m/s. 07/15/2021= 0.98 m/s    Time 12    Period Weeks    Status Achieved    Target Date 07/15/21      Additional Long Term Goals   Additional Long Term Goals Yes      PT LONG TERM GOAL #6   Title Pt will increase 6MWT by at least 18m (154ft) in order to demonstrate clinically significant improvement in cardiopulmonary endurance and community ambulation    Baseline 02/03/2021= 1100 feet; 03/18/21= 1125 feet without AD.  04/22/2021= 1175 feet without AD 07/15/2021= 1210 feet without an AD    Time 11    Period Weeks    Status On-going    Target Date 10/07/21      PT LONG TERM GOAL #7   Title Pt will increase 10MWT to 1.0 m/s or greater  in order to demonstrate clinically significant improvement in community ambulation.    Baseline 07/15/2021= 0.98 m/s    Time 12    Period Weeks    Status New    Target Date 10/07/21                   Plan - 07/29/21 1702     Clinical Impression Statement Pt continues to present with excellent motivation for copmletion of PT program. Continued to focus on dual task interventions in order to improve patients attentional focus and closely target impairments he is experiencing. Pt continues to have increased challenge with dual tasks involving cognitive components such as recall but overll all dual task interventions this date were difficult. Pt will continue to benefit from skilled PT intervention in order to improve strength, balance, mobility and reduce risk of falls.    Personal Factors and Comorbidities Comorbidity 3+    Comorbidities Anoxic brain injury (1999), Cardiac (pacemaker/debrillator), HTN    Examination-Activity Limitations Bathing;Caring for Others;Lift;Stairs;Transfers    Examination-Participation Restrictions Community Activity;Yard Work    Stability/Clinical Decision Making Stable/Uncomplicated    Rehab Potential Good    PT Frequency 1x / week    PT Duration 12 weeks    PT Treatment/Interventions ADLs/Self Care Home Management;Cryotherapy;Moist Heat;DME Instruction;Gait training;Stair training;Functional mobility training;Therapeutic activities;Therapeutic exercise;Balance training;Neuromuscular re-education;Patient/family education;Manual techniques;Passive range of motion;Dry needling;Canalith Repostioning;Ultrasound;Vestibular;Joint Manipulations    PT Next Visit Plan Progress  LE strengthening next visit and continue with  balance exercises.    PT Home Exercise Plan No changes this visit    Consulted and Agree with Plan of Care Patient;Family member/caregiver    Family Member Consulted Wife- [name]             Patient will benefit from skilled therapeutic  intervention in order to improve the following deficits and impairments:  Abnormal gait, Decreased activity tolerance, Decreased balance, Decreased cognition, Decreased coordination, Decreased endurance, Decreased mobility, Decreased strength, Difficulty walking  Visit Diagnosis: Abnormality of gait and mobility  Difficulty in walking, not elsewhere classified  Muscle weakness (generalized)  Unsteadiness on feet     Problem List Patient Active Problem List   Diagnosis Date Noted   Neck pain 07/02/2021   Gait abnormality 12/16/2020   Nonintractable headache 09/30/2020   Tinnitus 09/09/2019   History of sudden cardiac arrest 01/09/2019   Viral syndrome 09/06/2018   Seizures ([place]) 03/02/2018   Syncope 12/26/2017   Other social stressor 12/26/2017   Health care maintenance 08/14/2017   Family hx of prostate cancer    Allergy    Insomnia 10/20/2015   Advance care planning 03/08/2014   Memory loss 09/14/2013   Hypoxic brain injury ([place]) 09/14/2013   Medicare annual wellness visit, subsequent 02/16/2013   Shortness of breath 08/10/2011   Implantable cardioverter-defibrillator (ICD) in situ 02/03/2009   Hypothyroidism 11/18/2008   CONTACT DERMATITIS&OTHER ECZEMA DUE TO PLANTS 02/03/2008   IDIOPATHIC URTICARIA 02/03/2008   Hypertension 02/03/2008   HLD (hyperlipidemia) Oct 28, 2007   Attention deficit disorder 2007-10-28   PVC/VT non sustained October 28, 2007   Sleep apnea 10-28-2007   HEADACHE 10/28/07   SUDDEN DEATH-aborted 10/28/07   LOW BACK PAIN SYNDROME 10/11/2007    Particia Lather, PT 07/29/2021, 5:07 PM  [place] MAIN [facility] SERVICES [address] [place], [place], [address] Phone: [phone]   Fax:  [phone]  Name: JANIEL CRISOSTOMO MRN: [id number] Date of Birth: 18-Jun-1951

## 2021-08-02 DIAGNOSIS — H00014 Hordeolum externum left upper eyelid: Secondary | ICD-10-CM | POA: Diagnosis not present

## 2021-08-05 ENCOUNTER — Other Ambulatory Visit: Payer: Self-pay

## 2021-08-05 ENCOUNTER — Ambulatory Visit: Payer: Medicare Other | Attending: Neurology

## 2021-08-05 DIAGNOSIS — R262 Difficulty in walking, not elsewhere classified: Secondary | ICD-10-CM | POA: Diagnosis not present

## 2021-08-05 DIAGNOSIS — R269 Unspecified abnormalities of gait and mobility: Secondary | ICD-10-CM | POA: Diagnosis not present

## 2021-08-05 DIAGNOSIS — R2681 Unsteadiness on feet: Secondary | ICD-10-CM | POA: Diagnosis not present

## 2021-08-05 DIAGNOSIS — M6281 Muscle weakness (generalized): Secondary | ICD-10-CM | POA: Insufficient documentation

## 2021-08-05 DIAGNOSIS — H00014 Hordeolum externum left upper eyelid: Secondary | ICD-10-CM | POA: Diagnosis not present

## 2021-08-05 NOTE — Therapy (Signed)
[place] MAIN [facility] SERVICES [address] [place], [place], [address] Phone: [phone]   Fax:  [phone]  Physical Therapy Treatment/Physical Therapy Progress Note   Dates of reporting period  05/13/2022   to   08/05/2021  Patient Details  Name: Larry [name] MRN: [id number] Date of Birth: 07/20/1950 Referring Provider (PT): Dr. Krista Blue   Encounter Date: 08/05/2021   PT End of Session - 08/05/21 1505     Visit Number 30    Number of Visits 39    Date for PT Re-Evaluation 10/07/21    Authorization Type UHC Medicare    Authorization Time Period 01/27/2021- 27/12/2374; Recert 28/31/5176- 16/01/3709; 07/15/2021-10/07/2021    PT Start Time 1345    PT Stop Time 1420    PT Time Calculation (min) 35 min    Equipment Utilized During Treatment Gait belt    Activity Tolerance Patient tolerated treatment well    Behavior During Therapy [facility] for tasks assessed/performed             Past Medical History:  Diagnosis Date   ADD (attention deficit disorder)    Allergy    seasonal flares of perinneal allergies   Cancer ([place])    melanoma   Cardiac arrest ([place]) 07/1997   aborted   Dementia ([place])    post resusitative   Depression    Diverticulosis    Family hx of prostate cancer    GERD (gastroesophageal reflux disease)    Headache(784.0)    Hyperlipidemia    Hypothyroidism    ICD (implantable cardiac defibrillator) in place    Idiopathic urticaria    Internal hemorrhoids    Low back pain syndrome    Melanoma in situ of skin of trunk ([place])    chest   PVC (premature ventricular contraction)    associated with cardiac arrest   Sleep apnea    no c-pap   Tubular adenoma of colon 10/2010    Past Surgical History:  Procedure Laterality Date    implantation x2     CARDIAC DEFIBRILLATOR PLACEMENT  07/01/2010   Explanation of a previously implanted device, pocket revision, and insertion of a new device, and intraoperative defibrillation  threshold testing Caryl Comes)   CARDIAC DEFIBRILLATOR REMOVAL  2004   Defibrillator change Caryl Comes) 3 times changed   CATARACT EXTRACTION W/PHACO Left 02/04/2021   Procedure: CATARACT EXTRACTION PHACO AND INTRAOCULAR LENS PLACEMENT ([place]) LEFT VIVITY toric LENS 9.21 01:11.6;  Surgeon: Leandrew Koyanagi, MD;  Location: [place];  Service: Ophthalmology;  Laterality: Left;  keep patient at 11:30 arrival   CATARACT EXTRACTION W/PHACO Right 03/04/2021   Procedure: CATARACT EXTRACTION PHACO AND INTRAOCULAR LENS PLACEMENT ([place]) RIGHT VIVITY LENS 8.59 01:15.3;  Surgeon: Leandrew Koyanagi, MD;  Location: [place];  Service: Ophthalmology;  Laterality: Right;   HEMORRHOID SURGERY     ICD     TONSILLECTOMY      There were no vitals filed for this visit.   Subjective Assessment - 08/05/21 1502     Subjective Pt reports having some eye problems today and having a stye - was treated by [facility] earlier and feeling better now.    Patient is accompained by: Family member    Pertinent History Per note submitted by Dr. Krista Blue on 12/15/2020- The patient has an underlying medical history of cardiac death from ventricular fibrillation with hypoxic brain injury in Jan 1999 due to arrythmia, 15 minutes without heart beat,  status post defibrillator placement, He stayed  in hospital for 35 days. On 12/16/2020-Gait abnormality, Also related to the previous anoxic brain injury, deconditioning,    Limitations Walking;House hold activities    How long can you sit comfortably? no limitations    How long can you stand comfortably? Not really limited    How long can you walk comfortably? 15-20    Patient Stated Goals To improve transfers, balance and walking.    Currently in Pain? No/denies    Pain Onset More than a month ago              ITNERVENTIONS:   10 MWT= 0.95 m/s   6 min walk test= 1190 feet without AD  Sit to stand with blue airex pad under 1 foot to bias power to opp LE x 10 reps each  LE position    Neuromuscular re-education:  Cone activities: Weave walk - figure 8 around cones focusing on staying as close to cones with minimal steps as needed x 2 trials forward and 2 trials backward- Increased difficulty going backward.   Four corners- (Cones) -  1st cone to 2nd- forward walk- No LOB 2nd cone to 3rd - side step 3rd cone to 4th- backward walk- Very short steps- step to technique 4th cone to 1st- side step.  Education provided throughout session via VC/TC and demonstration to facilitate movement at target joints and correct muscle activation for all testing and exercises performed.                              PT Education - 08/05/21 1504     Education Details exercise technique    Person(s) Educated Patient    Methods Explanation;Demonstration;Tactile cues;Verbal cues    Comprehension Verbalized understanding;Returned demonstration;Need further instruction;Verbal cues required;Tactile cues required              PT Short Term Goals - 03/18/21 1306       PT SHORT TERM GOAL #1   Title Pt will be independent with HEP in order to improve strength and balance in order to decrease fall risk and improve function at home and work.    Baseline 01/27/2021- Patient presents with no formal HEP in place. 03/18/2021- Patient is knowledgeable of HEP- WIfe reports he is not always compliant but not because I don't know what to do."    Time 6    Period Weeks    Status Achieved    Target Date 03/10/21               PT Long Term Goals - 08/05/21 1404       PT LONG TERM GOAL #1   Title Pt will improve BERG by at least 5 points in order to demonstrate clinically significant improvement in balance.    Baseline 01/27/2021= 43/56; 03/18/2021=50/56    Time 12    Period Weeks    Status Achieved      PT LONG TERM GOAL #2   Title Pt will improve FOTO to target score of 55% to display perceived improvements in ability to complete ADL's.     Baseline 01/27/2021= 42%; 04/22/2021- Will assess next visit; 05/06/2021= 64%    Time 12    Period Weeks    Status Achieved      PT LONG TERM GOAL #3   Title Pt will decrease 5TSTS by at least 3 seconds in order to demonstrate clinically significant improvement in LE strength.    Baseline 01/27/2021=  30.07 without UE support; 03/18/2021= 24.78 sec without UE Support; 04/22/2021= 14.47 sec without UE support; 07/15/2021- 17.43 sec without UE support    Time 12    Period Weeks    Status Achieved      PT LONG TERM GOAL #4   Title Pt will decrease TUG to below 15  seconds/decrease in order to demonstrate decreased fall risk.    Baseline 01/27/2021= 20.97 without an AD; 03/18/2021= 19.2 sec without AD; 04/22/2021= 17 sec without AD; 05/13/2021= 14.88 sec. 07/15/2021= 14.7 sec avg without AD    Time 12    Period Weeks    Status Achieved    Target Date 07/15/21      PT LONG TERM GOAL #5   Title Pt will increase 10MWT by at least 0.13 m/s in order to demonstrate clinically significant improvement in community ambulation.    Baseline 01/27/2021= 0.67 m/s without an AD.  9/14= 0.75 m/s; 04/22/2021= 0.82 sec without an AD- WIll keep goal active to ensure patient can perform consistently. 05/13/2021= 0.89 m/s. 07/15/2021= 0.98 m/s    Time 12    Period Weeks    Status Achieved    Target Date 07/15/21      PT LONG TERM GOAL #6   Title Pt will increase 6MWT by at least 57m (17ft) in order to demonstrate clinically significant improvement in cardiopulmonary endurance and community ambulation    Baseline 02/03/2021= 1100 feet; 03/18/21= 1125 feet without AD.  04/22/2021= 1175 feet without AD 07/15/2021= 1210 feet without an AD; 08/05/2021= 1190 feet without AD    Time 11    Period Weeks    Status On-going    Target Date 10/07/21      PT LONG TERM GOAL #7   Title Pt will increase 10MWT to 1.0 m/s or greater in order to demonstrate clinically significant improvement in community ambulation.    Baseline 07/15/2021=  0.98 m/s    Time 12    Period Weeks    Status New    Target Date 10/07/21                  Plan - 08/05/21 1450     Clinical Impression Statement Patient presents today with good motivation yet reports slightly sedated this afternoon after going to optometrist- for a stye. Patient was seen for progress note today and performed well but slight decrease from last assessment- most likely related to patient not feeling great today. He was able to follow all VC's and no significant LOB. Will plan on next visit to return to performing more dual task if patient feeling better. Patient's condition has the potential to improve in response to therapy. Maximum improvement is yet to be obtained. The anticipated improvement is attainable and reasonable in a generally predictable time. Pt will continue to benefit from skilled PT intervention in order to improve strength, balance, mobility and reduce risk of falls.    Personal Factors and Comorbidities Comorbidity 3+    Comorbidities Anoxic brain injury (1999), Cardiac (pacemaker/debrillator), HTN    Examination-Activity Limitations Bathing;Caring for Others;Lift;Stairs;Transfers    Examination-Participation Restrictions Community Activity;Yard Work    Stability/Clinical Decision Making Stable/Uncomplicated    Rehab Potential Good    PT Frequency 1x / week    PT Duration 12 weeks    PT Treatment/Interventions ADLs/Self Care Home Management;Cryotherapy;Moist Heat;DME Instruction;Gait training;Stair training;Functional mobility training;Therapeutic activities;Therapeutic exercise;Balance training;Neuromuscular re-education;Patient/family education;Manual techniques;Passive range of motion;Dry needling;Canalith Repostioning;Ultrasound;Vestibular;Joint Manipulations    PT Next Visit Plan Progress LE  strengthening next visit and continue with  balance exercises.    PT Home Exercise Plan No changes this visit    Consulted and Agree with Plan of Care  Patient;Family member/caregiver    Family Member Consulted Wife- [name]             Patient will benefit from skilled therapeutic intervention in order to improve the following deficits and impairments:  Abnormal gait, Decreased activity tolerance, Decreased balance, Decreased cognition, Decreased coordination, Decreased endurance, Decreased mobility, Decreased strength, Difficulty walking  Visit Diagnosis: Abnormality of gait and mobility  Difficulty in walking, not elsewhere classified  Muscle weakness (generalized)  Unsteadiness on feet     Problem List Patient Active Problem List   Diagnosis Date Noted   Neck pain 07/02/2021   Gait abnormality 12/16/2020   Nonintractable headache 09/30/2020   Tinnitus 09/09/2019   History of sudden cardiac arrest 01/09/2019   Viral syndrome 09/06/2018   Seizures ([place]) 03/02/2018   Syncope 12/26/2017   Other social stressor 12/26/2017   Health care maintenance 08/14/2017   Family hx of prostate cancer    Allergy    Insomnia 10/20/2015   Advance care planning 03/08/2014   Memory loss 09/14/2013   Hypoxic brain injury ([place]) 09/14/2013   Medicare annual wellness visit, subsequent 02/16/2013   Shortness of breath 08/10/2011   Implantable cardioverter-defibrillator (ICD) in situ 02/03/2009   Hypothyroidism 11/18/2008   CONTACT DERMATITIS&OTHER ECZEMA DUE TO PLANTS 02/03/2008   IDIOPATHIC URTICARIA 02/03/2008   Hypertension 02/03/2008   HLD (hyperlipidemia) 16-Nov-2007   Attention deficit disorder Nov 16, 2007   PVC/VT non sustained Nov 16, 2007   Sleep apnea 2007-11-16   HEADACHE 2007/11/16   SUDDEN DEATH-aborted 11-16-2007   LOW BACK PAIN SYNDROME 10/11/2007    Lewis Moccasin, PT 08/05/2021, 3:08 PM  [place] MAIN [facility] SERVICES [address] [place], [place], [address] Phone: [phone]   Fax:  [phone]  Name: Larry [name] MRN: [id number] Date of Birth: July 10, 1950

## 2021-08-10 ENCOUNTER — Other Ambulatory Visit: Payer: Self-pay

## 2021-08-10 ENCOUNTER — Ambulatory Visit: Payer: Medicare Other | Admitting: Physical Therapy

## 2021-08-10 DIAGNOSIS — R262 Difficulty in walking, not elsewhere classified: Secondary | ICD-10-CM | POA: Diagnosis not present

## 2021-08-10 DIAGNOSIS — R2681 Unsteadiness on feet: Secondary | ICD-10-CM | POA: Diagnosis not present

## 2021-08-10 DIAGNOSIS — R269 Unspecified abnormalities of gait and mobility: Secondary | ICD-10-CM | POA: Diagnosis not present

## 2021-08-10 DIAGNOSIS — M6281 Muscle weakness (generalized): Secondary | ICD-10-CM | POA: Diagnosis not present

## 2021-08-10 NOTE — Therapy (Signed)
[place] MAIN [facility] SERVICES [address] [place], [place], [address] Phone: [phone]   Fax:  [phone]  Physical Therapy Treatment  Patient Details  Name: Larry [name] MRN: [id number] Date of Birth: Apr 21, 1951 Referring Provider (PT): Dr. Krista Blue   Encounter Date: 08/10/2021   PT End of Session - 08/10/21 1149     Visit Number 31    Number of Visits 39    Date for PT Re-Evaluation 10/07/21    Authorization Type UHC Medicare    Authorization Time Period 01/27/2021- 94/80/1655; Recert 37/48/2707- 86/75/4492; 07/15/2021-10/07/2021    PT Start Time 1148    PT Stop Time 1229    PT Time Calculation (min) 41 min    Equipment Utilized During Treatment Gait belt    Activity Tolerance Patient tolerated treatment well    Behavior During Therapy WFL for tasks assessed/performed             Past Medical History:  Diagnosis Date   ADD (attention deficit disorder)    Allergy    seasonal flares of perinneal allergies   Cancer ([place])    melanoma   Cardiac arrest ([place]) 07/1997   aborted   Dementia ([place])    post resusitative   Depression    Diverticulosis    Family hx of prostate cancer    GERD (gastroesophageal reflux disease)    Headache(784.0)    Hyperlipidemia    Hypothyroidism    ICD (implantable cardiac defibrillator) in place    Idiopathic urticaria    Internal hemorrhoids    Low back pain syndrome    Melanoma in situ of skin of trunk ([place])    chest   PVC (premature ventricular contraction)    associated with cardiac arrest   Sleep apnea    no c-pap   Tubular adenoma of colon 10/2010    Past Surgical History:  Procedure Laterality Date    implantation x2     CARDIAC DEFIBRILLATOR PLACEMENT  07/01/2010   Explanation of a previously implanted device, pocket revision, and insertion of a new device, and intraoperative defibrillation threshold testing Larry [name])   CARDIAC DEFIBRILLATOR REMOVAL  2004   Defibrillator change  Larry [name]) 3 times changed   CATARACT EXTRACTION W/PHACO Left 02/04/2021   Procedure: CATARACT EXTRACTION PHACO AND INTRAOCULAR LENS PLACEMENT ([place]) LEFT VIVITY toric LENS 9.21 01:11.6;  Surgeon: Larry Koyanagi, MD;  Location: [place];  Service: Ophthalmology;  Laterality: Left;  keep patient at 11:30 arrival   CATARACT EXTRACTION W/PHACO Right 03/04/2021   Procedure: CATARACT EXTRACTION PHACO AND INTRAOCULAR LENS PLACEMENT ([place]) RIGHT VIVITY LENS 8.59 01:15.3;  Surgeon: Larry Koyanagi, MD;  Location: [place];  Service: Ophthalmology;  Laterality: Right;   HEMORRHOID SURGERY     ICD     TONSILLECTOMY      There were no vitals filed for this visit.   Subjective Assessment - 08/10/21 1149     Subjective pt reports feeling good today. No changes since last session.    Patient is accompained by: Family member    Pertinent History Per note submitted by Dr. Krista Blue on 12/15/2020- The patient has an underlying medical history of cardiac death from ventricular fibrillation with hypoxic brain injury in Jan 1999 due to arrythmia, 15 minutes without heart beat,  status post defibrillator placement, He stayed in hospital for 35 days. On 12/16/2020-Gait abnormality, Also related to the previous anoxic brain injury, deconditioning,    Limitations Walking;House hold activities    How long  can you sit comfortably? no limitations    How long can you stand comfortably? Not really limited    How long can you walk comfortably? 15-20    Patient Stated Goals To improve transfers, balance and walking.    Pain Onset More than a month ago             Exercise/Activity Sets/Reps/Time/ Resistance Assistance Charge type Comments  Airex dual task balance game.  3 x  CGA  Neuro  White board game  Adducted stance   Balance obstacle course  2 x through  CGA Neuro  Dual task of balance course and finding objects located around clinic on last attempt pt had to name hlidays ( DT cognitive) pt  reported increased challenge with dual task cognitive task.   STS with airex under feet  2 x 10  CGA  Neuro  With second set incorporated dual task (motor and cognitive) naming basketball players from memory as best as he was able  Gait training with other tasks 2 X 3-4 min of walking  CGA  neuro Head turns and directions changes throughout, with head turns pt had difficulty maintaining pace. Added retro walking as well on second set  -with retro walking pt had slow gait speed and decreased step length   10 Meter walk with ball hand off lateral  4 times through  SBA Neuro  Unable to maintain line of progression, cadence or speed with this task.  No stumbles noted Dual task motor                                      Treatment Provided this session   Pt educated throughout session about proper posture and technique with exercises. Improved exercise technique, movement at target joints, use of target muscles after min to mod verbal, visual, tactile cues. Note: Portions of this document were prepared using Dragon voice recognition software and although reviewed may contain unintentional dictation errors in syntax, grammar, or spelling.                               PT Short Term Goals - 03/18/21 1306       PT SHORT TERM GOAL #1   Title Pt will be independent with HEP in order to improve strength and balance in order to decrease fall risk and improve function at home and work.    Baseline 01/27/2021- Patient presents with no formal HEP in place. 03/18/2021- Patient is knowledgeable of HEP- WIfe reports he is not always compliant but not because I don't know what to do."    Time 6    Period Weeks    Status Achieved    Target Date 03/10/21               PT Long Term Goals - 08/05/21 1404       PT LONG TERM GOAL #1   Title Pt will improve BERG by at least 5 points in order to demonstrate clinically significant improvement in balance.    Baseline 01/27/2021=  43/56; 03/18/2021=50/56    Time 12    Period Weeks    Status Achieved      PT LONG TERM GOAL #2   Title Pt will improve FOTO to target score of 55% to display perceived improvements in ability to complete ADL's.    Baseline  01/27/2021= 42%; 04/22/2021- Will assess next visit; 05/06/2021= 64%    Time 12    Period Weeks    Status Achieved      PT LONG TERM GOAL #3   Title Pt will decrease 5TSTS by at least 3 seconds in order to demonstrate clinically significant improvement in LE strength.    Baseline 01/27/2021= 30.07 without UE support; 03/18/2021= 24.78 sec without UE Support; 04/22/2021= 14.47 sec without UE support; 07/15/2021- 17.43 sec without UE support    Time 12    Period Weeks    Status Achieved      PT LONG TERM GOAL #4   Title Pt will decrease TUG to below 15  seconds/decrease in order to demonstrate decreased fall risk.    Baseline 01/27/2021= 20.97 without an AD; 03/18/2021= 19.2 sec without AD; 04/22/2021= 17 sec without AD; 05/13/2021= 14.88 sec. 07/15/2021= 14.7 sec avg without AD    Time 12    Period Weeks    Status Achieved    Target Date 07/15/21      PT LONG TERM GOAL #5   Title Pt will increase 10MWT by at least 0.13 m/s in order to demonstrate clinically significant improvement in community ambulation.    Baseline 01/27/2021= 0.67 m/s without an AD.  9/14= 0.75 m/s; 04/22/2021= 0.82 sec without an AD- WIll keep goal active to ensure patient can perform consistently. 05/13/2021= 0.89 m/s. 07/15/2021= 0.98 m/s    Time 12    Period Weeks    Status Achieved    Target Date 07/15/21      PT LONG TERM GOAL #6   Title Pt will increase 6MWT by at least 39m (123ft) in order to demonstrate clinically significant improvement in cardiopulmonary endurance and community ambulation    Baseline 02/03/2021= 1100 feet; 03/18/21= 1125 feet without AD.  04/22/2021= 1175 feet without AD 07/15/2021= 1210 feet without an AD; 08/05/2021= 1190 feet without AD    Time 11    Period Weeks    Status  On-going    Target Date 10/07/21      PT LONG TERM GOAL #7   Title Pt will increase 10MWT to 1.0 m/s or greater in order to demonstrate clinically significant improvement in community ambulation.    Baseline 07/15/2021= 0.98 m/s    Time 12    Period Weeks    Status New    Target Date 10/07/21                   Plan - 08/10/21 1150     Clinical Impression Statement Pt presents with good motivation for completion of PT session. Pt able to progress with several dual task interventions this sessios and had increased reported difficulty with dual task cognitive activities compared to dual task motor tasks. Pt had significant difficulty maintaining his gait pattern and pace when incorporating dual task activities or adding head turns to patient's ambulatory tasks. Pt will continue to benefit from skilled PT intervention in order to improve his strength, balance, mobility and QOL.    Personal Factors and Comorbidities Comorbidity 3+    Comorbidities Anoxic brain injury (1999), Cardiac (pacemaker/debrillator), HTN    Examination-Activity Limitations Bathing;Caring for Others;Lift;Stairs;Transfers    Examination-Participation Restrictions Community Activity;Yard Work    Stability/Clinical Decision Making Stable/Uncomplicated    Rehab Potential Good    PT Frequency 1x / week    PT Duration 12 weeks    PT Treatment/Interventions ADLs/Self Care Home Management;Cryotherapy;Moist Heat;DME Instruction;Gait training;Stair training;Functional mobility training;Therapeutic activities;Therapeutic exercise;Balance  training;Neuromuscular re-education;Patient/family education;Manual techniques;Passive range of motion;Dry needling;Canalith Repostioning;Ultrasound;Vestibular;Joint Manipulations    PT Next Visit Plan Progress LE strengthening next visit and continue with  balance exercises.    PT Home Exercise Plan No changes this visit    Consulted and Agree with Plan of Care Patient;Family  member/caregiver    Family Member Consulted Wife- Larry [name]             Patient will benefit from skilled therapeutic intervention in order to improve the following deficits and impairments:  Abnormal gait, Decreased activity tolerance, Decreased balance, Decreased cognition, Decreased coordination, Decreased endurance, Decreased mobility, Decreased strength, Difficulty walking  Visit Diagnosis: Abnormality of gait and mobility  Difficulty in walking, not elsewhere classified  Unsteadiness on feet     Problem List Patient Active Problem List   Diagnosis Date Noted   Neck pain 07/02/2021   Gait abnormality 12/16/2020   Nonintractable headache 09/30/2020   Tinnitus 09/09/2019   History of sudden cardiac arrest 01/09/2019   Viral syndrome 09/06/2018   Seizures ([place]) 03/02/2018   Syncope 12/26/2017   Other social stressor 12/26/2017   Health care maintenance 08/14/2017   Family hx of prostate cancer    Allergy    Insomnia 10/20/2015   Advance care planning 03/08/2014   Memory loss 09/14/2013   Hypoxic brain injury ([place]) 09/14/2013   Medicare annual wellness visit, subsequent 02/16/2013   Shortness of breath 08/10/2011   Implantable cardioverter-defibrillator (ICD) in situ 02/03/2009   Hypothyroidism 11/18/2008   CONTACT DERMATITIS&OTHER ECZEMA DUE TO PLANTS 02/03/2008   IDIOPATHIC URTICARIA 02/03/2008   Hypertension 02/03/2008   HLD (hyperlipidemia) 12-Nov-2007   Attention deficit disorder 11-12-07   PVC/VT non sustained Nov 12, 2007   Sleep apnea 2007/11/12   HEADACHE Nov 12, 2007   SUDDEN DEATH-aborted Nov 12, 2007   LOW BACK PAIN SYNDROME 10/11/2007    Larry [name], PT 08/10/2021, 1:10 PM  [place] MAIN [facility] SERVICES [address] [place], [place], [address] Phone: [phone]   Fax:  [phone]  Name: Larry [name] MRN: [id number] Date of Birth: 1950-10-03

## 2021-08-19 ENCOUNTER — Ambulatory Visit: Payer: Medicare Other | Admitting: Physical Therapy

## 2021-08-23 ENCOUNTER — Other Ambulatory Visit: Payer: Self-pay | Admitting: Family Medicine

## 2021-08-23 DIAGNOSIS — I10 Essential (primary) hypertension: Secondary | ICD-10-CM

## 2021-08-23 DIAGNOSIS — E7849 Other hyperlipidemia: Secondary | ICD-10-CM

## 2021-08-24 NOTE — Telephone Encounter (Signed)
Patient due for AWV with provider in April; please call to schedule.

## 2021-08-25 NOTE — Telephone Encounter (Signed)
LMTCB to schedule.

## 2021-08-26 ENCOUNTER — Ambulatory Visit: Payer: Medicare Other

## 2021-08-26 ENCOUNTER — Other Ambulatory Visit: Payer: Self-pay

## 2021-08-26 DIAGNOSIS — M6281 Muscle weakness (generalized): Secondary | ICD-10-CM | POA: Diagnosis not present

## 2021-08-26 DIAGNOSIS — R269 Unspecified abnormalities of gait and mobility: Secondary | ICD-10-CM | POA: Diagnosis not present

## 2021-08-26 DIAGNOSIS — R262 Difficulty in walking, not elsewhere classified: Secondary | ICD-10-CM

## 2021-08-26 DIAGNOSIS — R2681 Unsteadiness on feet: Secondary | ICD-10-CM | POA: Diagnosis not present

## 2021-08-26 NOTE — Therapy (Signed)
[place] MAIN [facility] SERVICES [address] [place], [place], [address] Phone: [phone]   Fax:  [phone]  Physical Therapy Treatment  Patient Details  Name: Larry [name] MRN: [id number] Date of Birth: March 14, 1951 Referring Provider (PT): Dr. Krista Blue   Encounter Date: 08/26/2021   PT End of Session - 08/26/21 0946     Visit Number 32    Number of Visits 39    Date for PT Re-Evaluation 10/07/21    Authorization Type UHC Medicare    Authorization Time Period 01/27/2021- 67/34/1937; Recert 90/24/0973- 53/29/9242; 07/15/2021-10/07/2021    PT Start Time 0933    PT Stop Time 1014    PT Time Calculation (min) 41 min    Equipment Utilized During Treatment Gait belt    Activity Tolerance Patient tolerated treatment well    Behavior During Therapy [facility] for tasks assessed/performed             Past Medical History:  Diagnosis Date   ADD (attention deficit disorder)    Allergy    seasonal flares of perinneal allergies   Cancer ([place])    melanoma   Cardiac arrest ([place]) 07/1997   aborted   Dementia ([place])    post resusitative   Depression    Diverticulosis    Family hx of prostate cancer    GERD (gastroesophageal reflux disease)    Headache(784.0)    Hyperlipidemia    Hypothyroidism    ICD (implantable cardiac defibrillator) in place    Idiopathic urticaria    Internal hemorrhoids    Low back pain syndrome    Melanoma in situ of skin of trunk ([place])    chest   PVC (premature ventricular contraction)    associated with cardiac arrest   Sleep apnea    no c-pap   Tubular adenoma of colon 10/2010    Past Surgical History:  Procedure Laterality Date    implantation x2     CARDIAC DEFIBRILLATOR PLACEMENT  07/01/2010   Explanation of a previously implanted device, pocket revision, and insertion of a new device, and intraoperative defibrillation threshold testing Caryl Comes)   CARDIAC DEFIBRILLATOR REMOVAL  2004   Defibrillator change  Caryl Comes) 3 times changed   CATARACT EXTRACTION W/PHACO Left 02/04/2021   Procedure: CATARACT EXTRACTION PHACO AND INTRAOCULAR LENS PLACEMENT ([place]) LEFT VIVITY toric LENS 9.21 01:11.6;  Surgeon: Leandrew Koyanagi, MD;  Location: [place];  Service: Ophthalmology;  Laterality: Left;  keep patient at 11:30 arrival   CATARACT EXTRACTION W/PHACO Right 03/04/2021   Procedure: CATARACT EXTRACTION PHACO AND INTRAOCULAR LENS PLACEMENT ([place]) RIGHT VIVITY LENS 8.59 01:15.3;  Surgeon: Leandrew Koyanagi, MD;  Location: [place];  Service: Ophthalmology;  Laterality: Right;   HEMORRHOID SURGERY     ICD     TONSILLECTOMY      There were no vitals filed for this visit.   Subjective Assessment - 08/26/21 0941     Subjective Patient reports fatigued this morning. Reports compliant with HEP including neck stretching.    Patient is accompained by: Family member    Pertinent History Per note submitted by Dr. Krista Blue on 12/15/2020- The patient has an underlying medical history of cardiac death from ventricular fibrillation with hypoxic brain injury in Jan 1999 due to arrythmia, 15 minutes without heart beat,  status post defibrillator placement, He stayed in hospital for 35 days. On 12/16/2020-Gait abnormality, Also related to the previous anoxic brain injury, deconditioning,    Limitations Walking;House hold activities  How long can you sit comfortably? no limitations    How long can you stand comfortably? Not really limited    How long can you walk comfortably? 15-20    Patient Stated Goals To improve transfers, balance and walking.    Currently in Pain? No/denies    Pain Onset More than a month ago              INTERVENTIONS:   Neuromuscular re-ed:    Dynamic march x 2 min  Side step up from initial standing on airex pad- side step up to right onto 12" box- down to purple pad- then back to left and repeat x 10 reps    front step up from initial standing on airex pad-  step  up onto 6" box- down to purple pad- then complete tight 180 deg turn back  and repeat x 10 reps   Standing on 6" box - squat - touch cone on left side then another squat to right to touch a cone x 10 reps each  Tandem stand on 1/2 foam (Curve side up) - then dynamically move back foot forward in front of right then back to initial position x 10 reps each LE.   10 Meter walk with lateral ball hand off x 4 trials. Significant gait difficulty- decreased cadence and deviates laterally- requiring cueing to stay on course.    Education provided throughout session via VC/TC and demonstration to facilitate movement at target joints and correct muscle activation for all testing and exercises performed.                       PT Education - 08/26/21 [id number]     Education Details Exercise technique    Person(s) Educated Patient    Methods Explanation;Demonstration;Tactile cues;Verbal cues    Comprehension Verbalized understanding;Returned demonstration;Verbal cues required;Tactile cues required;Need further instruction              PT Short Term Goals - 03/18/21 1306       PT SHORT TERM GOAL #1   Title Pt will be independent with HEP in order to improve strength and balance in order to decrease fall risk and improve function at home and work.    Baseline 01/27/2021- Patient presents with no formal HEP in place. 03/18/2021- Patient is knowledgeable of HEP- WIfe reports he is not always compliant but not because I don't know what to do."    Time 6    Period Weeks    Status Achieved    Target Date 03/10/21               PT Long Term Goals - 08/05/21 1404       PT LONG TERM GOAL #1   Title Pt will improve BERG by at least 5 points in order to demonstrate clinically significant improvement in balance.    Baseline 01/27/2021= 43/56; 03/18/2021=50/56    Time 12    Period Weeks    Status Achieved      PT LONG TERM GOAL #2   Title Pt will improve FOTO to target score of  55% to display perceived improvements in ability to complete ADL's.    Baseline 01/27/2021= 42%; 04/22/2021- Will assess next visit; 05/06/2021= 64%    Time 12    Period Weeks    Status Achieved      PT LONG TERM GOAL #3   Title Pt will decrease 5TSTS by at least 3 seconds in order to  demonstrate clinically significant improvement in LE strength.    Baseline 01/27/2021= 30.07 without UE support; 03/18/2021= 24.78 sec without UE Support; 04/22/2021= 14.47 sec without UE support; 07/15/2021- 17.43 sec without UE support    Time 12    Period Weeks    Status Achieved      PT LONG TERM GOAL #4   Title Pt will decrease TUG to below 15  seconds/decrease in order to demonstrate decreased fall risk.    Baseline 01/27/2021= 20.97 without an AD; 03/18/2021= 19.2 sec without AD; 04/22/2021= 17 sec without AD; 05/13/2021= 14.88 sec. 07/15/2021= 14.7 sec avg without AD    Time 12    Period Weeks    Status Achieved    Target Date 07/15/21      PT LONG TERM GOAL #5   Title Pt will increase 10MWT by at least 0.13 m/s in order to demonstrate clinically significant improvement in community ambulation.    Baseline 01/27/2021= 0.67 m/s without an AD.  9/14= 0.75 m/s; 04/22/2021= 0.82 sec without an AD- WIll keep goal active to ensure patient can perform consistently. 05/13/2021= 0.89 m/s. 07/15/2021= 0.98 m/s    Time 12    Period Weeks    Status Achieved    Target Date 07/15/21      PT LONG TERM GOAL #6   Title Pt will increase 6MWT by at least 28m (125ft) in order to demonstrate clinically significant improvement in cardiopulmonary endurance and community ambulation    Baseline 02/03/2021= 1100 feet; 03/18/21= 1125 feet without AD.  04/22/2021= 1175 feet without AD 07/15/2021= 1210 feet without an AD; 08/05/2021= 1190 feet without AD    Time 11    Period Weeks    Status On-going    Target Date 10/07/21      PT LONG TERM GOAL #7   Title Pt will increase 10MWT to 1.0 m/s or greater in order to demonstrate clinically  significant improvement in community ambulation.    Baseline 07/15/2021= 0.98 m/s    Time 12    Period Weeks    Status New    Target Date 10/07/21                   Plan - 08/26/21 0950     Clinical Impression Statement Patient presents with good motivation today and responded well to balance exercises today. He was challenged with tandem walking and 10 m walk with dual task continuing to experience difficulty maintaining his symmetry with gait and focus on primary task. Pt will continue to benefit from skilled PT intervention in order to improve his strength, balance, mobility and QOL    Personal Factors and Comorbidities Comorbidity 3+    Comorbidities Anoxic brain injury (1999), Cardiac (pacemaker/debrillator), HTN    Examination-Activity Limitations Bathing;Caring for Others;Lift;Stairs;Transfers    Examination-Participation Restrictions Community Activity;Yard Work    Stability/Clinical Decision Making Stable/Uncomplicated    Rehab Potential Good    PT Frequency 1x / week    PT Duration 12 weeks    PT Treatment/Interventions ADLs/Self Care Home Management;Cryotherapy;Moist Heat;DME Instruction;Gait training;Stair training;Functional mobility training;Therapeutic activities;Therapeutic exercise;Balance training;Neuromuscular re-education;Patient/family education;Manual techniques;Passive range of motion;Dry needling;Canalith Repostioning;Ultrasound;Vestibular;Joint Manipulations    PT Next Visit Plan Progress LE strengthening next visit and continue with  balance exercises.    PT Home Exercise Plan No changes this visit    Consulted and Agree with Plan of Care Patient;Family member/caregiver    Family Member Consulted Wife- [name]  Patient will benefit from skilled therapeutic intervention in order to improve the following deficits and impairments:  Abnormal gait, Decreased activity tolerance, Decreased balance, Decreased cognition, Decreased coordination,  Decreased endurance, Decreased mobility, Decreased strength, Difficulty walking  Visit Diagnosis: Abnormality of gait and mobility  Difficulty in walking, not elsewhere classified  Muscle weakness (generalized)  Unsteadiness on feet     Problem List Patient Active Problem List   Diagnosis Date Noted   Neck pain 07/02/2021   Gait abnormality 12/16/2020   Nonintractable headache 09/30/2020   Tinnitus 09/09/2019   History of sudden cardiac arrest 01/09/2019   Viral syndrome 09/06/2018   Seizures ([place]) 03/02/2018   Syncope 12/26/2017   Other social stressor 12/26/2017   Health care maintenance 08/14/2017   Family hx of prostate cancer    Allergy    Insomnia 10/20/2015   Advance care planning 03/08/2014   Memory loss 09/14/2013   Hypoxic brain injury ([place]) 09/14/2013   Medicare annual wellness visit, subsequent 02/16/2013   Shortness of breath 08/10/2011   Implantable cardioverter-defibrillator (ICD) in situ 02/03/2009   Hypothyroidism 11/18/2008   CONTACT DERMATITIS&OTHER ECZEMA DUE TO PLANTS 02/03/2008   IDIOPATHIC URTICARIA 02/03/2008   Hypertension 02/03/2008   HLD (hyperlipidemia) 2007/11/17   Attention deficit disorder 11-17-2007   PVC/VT non sustained Nov 17, 2007   Sleep apnea 2007/11/17   HEADACHE 11/17/07   SUDDEN DEATH-aborted 11-17-07   LOW BACK PAIN SYNDROME 10/11/2007    Lewis Moccasin, PT 08/26/2021, 10:54 AM  [place] MAIN [facility] SERVICES [address] [place], [place], [address] Phone: [phone]   Fax:  [phone]  Name: NIKHOLAS GEFFRE MRN: [id number] Date of Birth: 1950-10-14

## 2021-09-02 ENCOUNTER — Other Ambulatory Visit: Payer: Self-pay

## 2021-09-02 ENCOUNTER — Ambulatory Visit: Payer: Medicare Other | Attending: Neurology

## 2021-09-02 DIAGNOSIS — R269 Unspecified abnormalities of gait and mobility: Secondary | ICD-10-CM

## 2021-09-02 DIAGNOSIS — R278 Other lack of coordination: Secondary | ICD-10-CM | POA: Diagnosis not present

## 2021-09-02 DIAGNOSIS — M6281 Muscle weakness (generalized): Secondary | ICD-10-CM

## 2021-09-02 DIAGNOSIS — R262 Difficulty in walking, not elsewhere classified: Secondary | ICD-10-CM

## 2021-09-02 DIAGNOSIS — R2681 Unsteadiness on feet: Secondary | ICD-10-CM | POA: Diagnosis not present

## 2021-09-02 NOTE — Therapy (Signed)
[place] MAIN [facility] SERVICES [address] [place], [place], [address] Phone: [phone]   Fax:  [phone]  Physical Therapy Treatment  Patient Details  Name: Larry [name] MRN: [id number] Date of Birth: 05-05-1951 Referring Provider (PT): Dr. Krista Blue   Encounter Date: 09/02/2021   PT End of Session - 09/02/21 1022     Visit Number 33    Number of Visits 39    Date for PT Re-Evaluation 10/07/21    Authorization Type UHC Medicare    Authorization Time Period 01/27/2021- 44/09/4740; Recert 59/56/3875- 64/33/2951; 07/15/2021-10/07/2021    PT Start Time 1018    PT Stop Time 1059    PT Time Calculation (min) 41 min    Equipment Utilized During Treatment Gait belt    Activity Tolerance Patient tolerated treatment well    Behavior During Therapy WFL for tasks assessed/performed             Past Medical History:  Diagnosis Date   ADD (attention deficit disorder)    Allergy    seasonal flares of perinneal allergies   Cancer ([place])    melanoma   Cardiac arrest ([place]) 07/1997   aborted   Dementia ([place])    post resusitative   Depression    Diverticulosis    Family hx of prostate cancer    GERD (gastroesophageal reflux disease)    Headache(784.0)    Hyperlipidemia    Hypothyroidism    ICD (implantable cardiac defibrillator) in place    Idiopathic urticaria    Internal hemorrhoids    Low back pain syndrome    Melanoma in situ of skin of trunk ([place])    chest   PVC (premature ventricular contraction)    associated with cardiac arrest   Sleep apnea    no c-pap   Tubular adenoma of colon 10/2010    Past Surgical History:  Procedure Laterality Date    implantation x2     CARDIAC DEFIBRILLATOR PLACEMENT  07/01/2010   Explanation of a previously implanted device, pocket revision, and insertion of a new device, and intraoperative defibrillation threshold testing Caryl Comes)   CARDIAC DEFIBRILLATOR REMOVAL  2004   Defibrillator change  Caryl Comes) 3 times changed   CATARACT EXTRACTION W/PHACO Left 02/04/2021   Procedure: CATARACT EXTRACTION PHACO AND INTRAOCULAR LENS PLACEMENT ([place]) LEFT VIVITY toric LENS 9.21 01:11.6;  Surgeon: Leandrew Koyanagi, MD;  Location: [place];  Service: Ophthalmology;  Laterality: Left;  keep patient at 11:30 arrival   CATARACT EXTRACTION W/PHACO Right 03/04/2021   Procedure: CATARACT EXTRACTION PHACO AND INTRAOCULAR LENS PLACEMENT ([place]) RIGHT VIVITY LENS 8.59 01:15.3;  Surgeon: Leandrew Koyanagi, MD;  Location: [place];  Service: Ophthalmology;  Laterality: Right;   HEMORRHOID SURGERY     ICD     TONSILLECTOMY      There were no vitals filed for this visit.   Subjective Assessment - 09/02/21 1021     Subjective Patient reports having a good week without complaints including no pain and no falls.    Patient is accompained by: Family member    Pertinent History Per note submitted by Dr. Krista Blue on 12/15/2020- The patient has an underlying medical history of cardiac death from ventricular fibrillation with hypoxic brain injury in Jan 1999 due to arrythmia, 15 minutes without heart beat,  status post defibrillator placement, He stayed in hospital for 35 days. On 12/16/2020-Gait abnormality, Also related to the previous anoxic brain injury, deconditioning,    Limitations Walking;House hold activities  How long can you sit comfortably? no limitations    How long can you stand comfortably? Not really limited    How long can you walk comfortably? 15-20    Patient Stated Goals To improve transfers, balance and walking.    Currently in Pain? No/denies    Pain Onset More than a month ago              INTERVENTIONS:    Neuromuscular re-ed:      Dynamic march (standing on blue airex pad)  without UE support x 3 min   step up from initial onto  airex pad- x 20 reps      front step up from initial standing on airex pad-  step up onto 6" box- down to purple pad- then  complete tight 180 deg turn back  and repeat x 10 reps    Dual task- standing on blue airex pad  with feet narrowed (touching)- holding a deck of cards and placing in two piles- Red to left on tray table and black to right on tray table- varying the position of tray table from right in front of patient to further out in front challenging his ability to forward lean.     Dual task: Static standing on blue airex pad  with feet Staggered- holding a deck of cards and placing in two piles- Red to left on tray table and black to right on tray table- varying the position of tray table from right in front of patient to further out in front challenging his ability to forward lean. *Patient instructed to switch feet every time he drew an "Ace" . Patient completed deck of cards x 2 trials.   Dual task - staggered standing on airex pad with 2 tray tables placed on either side of pad- Patient instructed to twist to left side if "Black"  card and twist and place card on right side if "red" Patient switched feet on every "ace" card drawn. Patient completed deck of cards x 1                          PT Education - 09/02/21 1021     Education Details Exercise technique    Person(s) Educated Patient    Methods Explanation;Demonstration;Tactile cues;Verbal cues    Comprehension Verbalized understanding;Returned demonstration;Verbal cues required;Tactile cues required;Need further instruction              PT Short Term Goals - 03/18/21 1306       PT SHORT TERM GOAL #1   Title Pt will be independent with HEP in order to improve strength and balance in order to decrease fall risk and improve function at home and work.    Baseline 01/27/2021- Patient presents with no formal HEP in place. 03/18/2021- Patient is knowledgeable of HEP- WIfe reports he is not always compliant but not because I don't know what to do."    Time 6    Period Weeks    Status Achieved    Target Date 03/10/21                PT Long Term Goals - 08/05/21 1404       PT LONG TERM GOAL #1   Title Pt will improve BERG by at least 5 points in order to demonstrate clinically significant improvement in balance.    Baseline 01/27/2021= 43/56; 03/18/2021=50/56    Time 12    Period Weeks  Status Achieved      PT LONG TERM GOAL #2   Title Pt will improve FOTO to target score of 55% to display perceived improvements in ability to complete ADL's.    Baseline 01/27/2021= 42%; 04/22/2021- Will assess next visit; 05/06/2021= 64%    Time 12    Period Weeks    Status Achieved      PT LONG TERM GOAL #3   Title Pt will decrease 5TSTS by at least 3 seconds in order to demonstrate clinically significant improvement in LE strength.    Baseline 01/27/2021= 30.07 without UE support; 03/18/2021= 24.78 sec without UE Support; 04/22/2021= 14.47 sec without UE support; 07/15/2021- 17.43 sec without UE support    Time 12    Period Weeks    Status Achieved      PT LONG TERM GOAL #4   Title Pt will decrease TUG to below 15  seconds/decrease in order to demonstrate decreased fall risk.    Baseline 01/27/2021= 20.97 without an AD; 03/18/2021= 19.2 sec without AD; 04/22/2021= 17 sec without AD; 05/13/2021= 14.88 sec. 07/15/2021= 14.7 sec avg without AD    Time 12    Period Weeks    Status Achieved    Target Date 07/15/21      PT LONG TERM GOAL #5   Title Pt will increase 10MWT by at least 0.13 m/s in order to demonstrate clinically significant improvement in community ambulation.    Baseline 01/27/2021= 0.67 m/s without an AD.  9/14= 0.75 m/s; 04/22/2021= 0.82 sec without an AD- WIll keep goal active to ensure patient can perform consistently. 05/13/2021= 0.89 m/s. 07/15/2021= 0.98 m/s    Time 12    Period Weeks    Status Achieved    Target Date 07/15/21      PT LONG TERM GOAL #6   Title Pt will increase 6MWT by at least 22m (156ft) in order to demonstrate clinically significant improvement in cardiopulmonary endurance and  community ambulation    Baseline 02/03/2021= 1100 feet; 03/18/21= 1125 feet without AD.  04/22/2021= 1175 feet without AD 07/15/2021= 1210 feet without an AD; 08/05/2021= 1190 feet without AD    Time 11    Period Weeks    Status On-going    Target Date 10/07/21      PT LONG TERM GOAL #7   Title Pt will increase 10MWT to 1.0 m/s or greater in order to demonstrate clinically significant improvement in community ambulation.    Baseline 07/15/2021= 0.98 m/s    Time 12    Period Weeks    Status New    Target Date 10/07/21                   Plan - 09/02/21 1121     Clinical Impression Statement Patient presented to clinic with excellent motivation- denying any pain or issues. Treatment focused on dual task involving physical (Balance) task then combined with cognitive task involving playing deck of cards. Patient was able to perform dual task fairly well - able to follow cues- Min LOB with fatigue and VC for correction while placing cards. But for majority of treatment patient performed well and completed all tasks without significant LOB and minimally reported fatigue.Pt will continue to benefit from skilled PT intervention in order to improve his strength, balance, mobility and QOL    Personal Factors and Comorbidities Comorbidity 3+    Comorbidities Anoxic brain injury (1999), Cardiac (pacemaker/debrillator), HTN    Examination-Activity Limitations Bathing;Caring for Others;Lift;Stairs;Transfers  Examination-Participation Restrictions Community Activity;Yard Work    Stability/Clinical Decision Making Stable/Uncomplicated    Rehab Potential Good    PT Frequency 1x / week    PT Duration 12 weeks    PT Treatment/Interventions ADLs/Self Care Home Management;Cryotherapy;Moist Heat;DME Instruction;Gait training;Stair training;Functional mobility training;Therapeutic activities;Therapeutic exercise;Balance training;Neuromuscular re-education;Patient/family education;Manual techniques;Passive  range of motion;Dry needling;Canalith Repostioning;Ultrasound;Vestibular;Joint Manipulations    PT Next Visit Plan Progress LE strengthening next visit and continue with  balance exercises.    PT Home Exercise Plan No changes this visit    Consulted and Agree with Plan of Care Patient;Family member/caregiver    Family Member Consulted Wife- [name]             Patient will benefit from skilled therapeutic intervention in order to improve the following deficits and impairments:  Abnormal gait, Decreased activity tolerance, Decreased balance, Decreased cognition, Decreased coordination, Decreased endurance, Decreased mobility, Decreased strength, Difficulty walking  Visit Diagnosis: Abnormality of gait and mobility  Difficulty in walking, not elsewhere classified  Muscle weakness (generalized)  Unsteadiness on feet     Problem List Patient Active Problem List   Diagnosis Date Noted   Neck pain 07/02/2021   Gait abnormality 12/16/2020   Nonintractable headache 09/30/2020   Tinnitus 09/09/2019   History of sudden cardiac arrest 01/09/2019   Viral syndrome 09/06/2018   Seizures ([place]) 03/02/2018   Syncope 12/26/2017   Other social stressor 12/26/2017   Health care maintenance 08/14/2017   Family hx of prostate cancer    Allergy    Insomnia 10/20/2015   Advance care planning 03/08/2014   Memory loss 09/14/2013   Hypoxic brain injury ([place]) 09/14/2013   Medicare annual wellness visit, subsequent 02/16/2013   Shortness of breath 08/10/2011   Implantable cardioverter-defibrillator (ICD) in situ 02/03/2009   Hypothyroidism 11/18/2008   CONTACT DERMATITIS&OTHER ECZEMA DUE TO PLANTS 02/03/2008   IDIOPATHIC URTICARIA 02/03/2008   Hypertension 02/03/2008   HLD (hyperlipidemia) 11/16/07   Attention deficit disorder Nov 16, 2007   PVC/VT non sustained 2007-11-16   Sleep apnea 11/16/2007   HEADACHE Nov 16, 2007   SUDDEN DEATH-aborted 2007/11/16   LOW BACK PAIN SYNDROME 10/11/2007     Lewis Moccasin, PT 09/02/2021, 11:27 AM  [place] MAIN [facility] SERVICES [address] [place], [place], [address] Phone: [phone]   Fax:  [phone]  Name: Larry [name] MRN: [id number] Date of Birth: 25-Mar-1951

## 2021-09-09 ENCOUNTER — Ambulatory Visit: Payer: Medicare Other

## 2021-09-09 ENCOUNTER — Other Ambulatory Visit: Payer: Self-pay

## 2021-09-09 DIAGNOSIS — R262 Difficulty in walking, not elsewhere classified: Secondary | ICD-10-CM

## 2021-09-09 DIAGNOSIS — R269 Unspecified abnormalities of gait and mobility: Secondary | ICD-10-CM

## 2021-09-09 DIAGNOSIS — R2681 Unsteadiness on feet: Secondary | ICD-10-CM

## 2021-09-09 DIAGNOSIS — R278 Other lack of coordination: Secondary | ICD-10-CM

## 2021-09-09 DIAGNOSIS — M6281 Muscle weakness (generalized): Secondary | ICD-10-CM | POA: Diagnosis not present

## 2021-09-09 NOTE — Therapy (Signed)
[place] MAIN [facility] SERVICES [address] [place], [place], [address] Phone: [phone]   Fax:  [phone]  Physical Therapy Treatment  Patient Details  Name: Larry [name] MRN: [id number] Date of Birth: [birth year] Referring Provider (PT): Dr. Krista Blue   Encounter Date: 09/09/2021   PT End of Session - 09/09/21 0859     Visit Number 34    Number of Visits 39    Date for PT Re-Evaluation 10/07/21    Authorization Type UHC Medicare    Authorization Time Period 01/27/2021- 02/58/5277; Recert 82/42/3536- 14/43/1540; 07/15/2021-10/07/2021    PT Start Time 0845    PT Stop Time 0929    PT Time Calculation (min) 44 min    Equipment Utilized During Treatment Gait belt    Activity Tolerance Patient tolerated treatment well    Behavior During Therapy [facility] for tasks assessed/performed             Past Medical History:  Diagnosis Date   ADD (attention deficit disorder)    Allergy    seasonal flares of perinneal allergies   Cancer ([place])    melanoma   Cardiac arrest ([place]) 07/1997   aborted   Dementia ([place])    post resusitative   Depression    Diverticulosis    Family hx of prostate cancer    GERD (gastroesophageal reflux disease)    Headache(784.0)    Hyperlipidemia    Hypothyroidism    ICD (implantable cardiac defibrillator) in place    Idiopathic urticaria    Internal hemorrhoids    Low back pain syndrome    Melanoma in situ of skin of trunk ([place])    chest   PVC (premature ventricular contraction)    associated with cardiac arrest   Sleep apnea    no c-pap   Tubular adenoma of colon 10/2010    Past Surgical History:  Procedure Laterality Date    implantation x2     CARDIAC DEFIBRILLATOR PLACEMENT  07/01/2010   Explanation of a previously implanted device, pocket revision, and insertion of a new device, and intraoperative defibrillation threshold testing Caryl Comes)   CARDIAC DEFIBRILLATOR REMOVAL  2004   Defibrillator change  Caryl Comes) 3 times changed   CATARACT EXTRACTION W/PHACO Left 02/04/2021   Procedure: CATARACT EXTRACTION PHACO AND INTRAOCULAR LENS PLACEMENT ([place]) LEFT VIVITY toric LENS 9.21 01:11.6;  Surgeon: Leandrew Koyanagi, MD;  Location: [place];  Service: Ophthalmology;  Laterality: Left;  keep patient at 11:30 arrival   CATARACT EXTRACTION W/PHACO Right 03/04/2021   Procedure: CATARACT EXTRACTION PHACO AND INTRAOCULAR LENS PLACEMENT ([place]) RIGHT VIVITY LENS 8.59 01:15.3;  Surgeon: Leandrew Koyanagi, MD;  Location: [place];  Service: Ophthalmology;  Laterality: Right;   HEMORRHOID SURGERY     ICD     TONSILLECTOMY      There were no vitals filed for this visit.   Subjective Assessment - 09/09/21 0851     Subjective Patient reports he is feeling well today with no complaints. He denies any falls    Patient is accompained by: Family member    Pertinent History Per note submitted by Dr. Krista Blue on 12/15/2020- The patient has an underlying medical history of cardiac death from ventricular fibrillation with hypoxic brain injury in Jan 1999 due to arrythmia, 15 minutes without heart beat,  status post defibrillator placement, He stayed in hospital for 35 days. On 12/16/2020-Gait abnormality, Also related to the previous anoxic brain injury, deconditioning,    Limitations Walking;House hold activities  How long can you sit comfortably? no limitations    How long can you stand comfortably? Not really limited    How long can you walk comfortably? 15-20    Patient Stated Goals To improve transfers, balance and walking.    Currently in Pain? No/denies    Pain Onset More than a month ago             INTERVENTIONS   Neuromuscular re-ed  Dynamic marching without UE support on airex pad x 2 min  Static stand  tandem with back foot on green therapad and front foot on blue airex pad x 30 sec hold then switch feet x 30 sec   Hangman game in above position-   Compass activity- Set  up  using steps in front and left/right side and nothing behind- Instructed patient to step onto whichever step direction called out- ex. [place], [place], [place], [place]  Added more cognitive task to above ex- Patient picked a state then used the compass activity while PT called out other states and patient had to think about which direction the state was located in relation to his pick.    Hangman game with staggered feet on airex pad- No sign. Difficulty.  Hangman game with tandem stance on airex pad- Increased difficulty maintaining position- Intermittent reaching with UE for support.   During brief rest breaks - Patient performed self stretching to c-spine- Rotation with use of towel (VC and demo for safe technique) and SB- hold 20 sec x 3 each direction - to left then right.   Education provided throughout session via VC/TC and demonstration to facilitate movement at target joints and correct muscle activation for all testing and exercises performed.                       PT Education - 09/09/21 0852     Education Details Exercise technique    Person(s) Educated Patient    Methods Explanation;Demonstration;Tactile cues;Verbal cues    Comprehension Verbalized understanding;Returned demonstration;Verbal cues required;Tactile cues required;Need further instruction              PT Short Term Goals - 03/18/21 1306       PT SHORT TERM GOAL #1   Title Pt will be independent with HEP in order to improve strength and balance in order to decrease fall risk and improve function at home and work.    Baseline 01/27/2021- Patient presents with no formal HEP in place. 03/18/2021- Patient is knowledgeable of HEP- WIfe reports he is not always compliant but not because I don't know what to do."    Time 6    Period Weeks    Status Achieved    Target Date 03/10/21               PT Long Term Goals - 08/05/21 1404       PT LONG TERM GOAL #1   Title Pt will improve BERG by at  least 5 points in order to demonstrate clinically significant improvement in balance.    Baseline 01/27/2021= 43/56; 03/18/2021=50/56    Time 12    Period Weeks    Status Achieved      PT LONG TERM GOAL #2   Title Pt will improve FOTO to target score of 55% to display perceived improvements in ability to complete ADL's.    Baseline 01/27/2021= 42%; 04/22/2021- Will assess next visit; 05/06/2021= 64%    Time 12    Period Weeks  Status Achieved      PT LONG TERM GOAL #3   Title Pt will decrease 5TSTS by at least 3 seconds in order to demonstrate clinically significant improvement in LE strength.    Baseline 01/27/2021= 30.07 without UE support; 03/18/2021= 24.78 sec without UE Support; 04/22/2021= 14.47 sec without UE support; 07/15/2021- 17.43 sec without UE support    Time 12    Period Weeks    Status Achieved      PT LONG TERM GOAL #4   Title Pt will decrease TUG to below 15  seconds/decrease in order to demonstrate decreased fall risk.    Baseline 01/27/2021= 20.97 without an AD; 03/18/2021= 19.2 sec without AD; 04/22/2021= 17 sec without AD; 05/13/2021= 14.88 sec. 07/15/2021= 14.7 sec avg without AD    Time 12    Period Weeks    Status Achieved    Target Date 07/15/21      PT LONG TERM GOAL #5   Title Pt will increase 10MWT by at least 0.13 m/s in order to demonstrate clinically significant improvement in community ambulation.    Baseline 01/27/2021= 0.67 m/s without an AD.  9/14= 0.75 m/s; 04/22/2021= 0.82 sec without an AD- WIll keep goal active to ensure patient can perform consistently. 05/13/2021= 0.89 m/s. 07/15/2021= 0.98 m/s    Time 12    Period Weeks    Status Achieved    Target Date 07/15/21      PT LONG TERM GOAL #6   Title Pt will increase 6MWT by at least 32m(1622f in order to demonstrate clinically significant improvement in cardiopulmonary endurance and community ambulation    Baseline 02/03/2021= 1100 feet; 03/18/21= 1125 feet without AD.  04/22/2021= 1175 feet without AD  07/15/2021= 1210 feet without an AD; 08/05/2021= 1190 feet without AD    Time 11    Period Weeks    Status On-going    Target Date 10/07/21      PT LONG TERM GOAL #7   Title Pt will increase 10MWT to 1.0 m/s or greater in order to demonstrate clinically significant improvement in community ambulation.    Baseline 07/15/2021= 0.98 m/s    Time 12    Period Weeks    Status New    Target Date 10/07/21                   Plan - 09/09/21 0900     Clinical Impression Statement Patient arrived today with good motivation and participated very well with all activities. He overall displayed improved static and dynamic balance- Slightly compromised during all dual task activities yet no obvious LOB except with tandem standing during hangman game. Pt will continue to benefit from skilled PT intervention in order to improve his strength, balance, mobility and QOL    Personal Factors and Comorbidities Comorbidity 3+    Comorbidities Anoxic brain injury (1999), Cardiac (pacemaker/debrillator), HTN    Examination-Activity Limitations Bathing;Caring for Others;Lift;Stairs;Transfers    Examination-Participation Restrictions Community Activity;Yard Work    Stability/Clinical Decision Making Stable/Uncomplicated    Rehab Potential Good    PT Frequency 1x / week    PT Duration 12 weeks    PT Treatment/Interventions ADLs/Self Care Home Management;Cryotherapy;Moist Heat;DME Instruction;Gait training;Stair training;Functional mobility training;Therapeutic activities;Therapeutic exercise;Balance training;Neuromuscular re-education;Patient/family education;Manual techniques;Passive range of motion;Dry needling;Canalith Repostioning;Ultrasound;Vestibular;Joint Manipulations    PT Next Visit Plan Progress LE strengthening next visit and continue with  balance exercises.    PT Home Exercise Plan No changes this visit    Consulted  and Agree with Plan of Care Patient;Family member/caregiver    Family Member  Consulted Wife- [name]             Patient will benefit from skilled therapeutic intervention in order to improve the following deficits and impairments:  Abnormal gait, Decreased activity tolerance, Decreased balance, Decreased cognition, Decreased coordination, Decreased endurance, Decreased mobility, Decreased strength, Difficulty walking  Visit Diagnosis: Abnormality of gait and mobility  Difficulty in walking, not elsewhere classified  Muscle weakness (generalized)  Other lack of coordination  Unsteadiness on feet     Problem List Patient Active Problem List   Diagnosis Date Noted   Neck pain 07/02/2021   Gait abnormality 12/16/2020   Nonintractable headache 09/30/2020   Tinnitus 09/09/2019   History of sudden cardiac arrest 01/09/2019   Viral syndrome 09/06/2018   Seizures ([place]) 03/02/2018   Syncope 12/26/2017   Other social stressor 12/26/2017   Health care maintenance 08/14/2017   Family hx of prostate cancer    Allergy    Insomnia 10/20/2015   Advance care planning 03/08/2014   Memory loss 09/14/2013   Hypoxic brain injury ([place]) 09/14/2013   Medicare annual wellness visit, subsequent 02/16/2013   Shortness of breath 08/10/2011   Implantable cardioverter-defibrillator (ICD) in situ 02/03/2009   Hypothyroidism 11/18/2008   CONTACT DERMATITIS&OTHER ECZEMA DUE TO PLANTS 02/03/2008   IDIOPATHIC URTICARIA 02/03/2008   Hypertension 02/03/2008   HLD (hyperlipidemia) 10/29/07   Attention deficit disorder 10/29/07   PVC/VT non sustained 2007-10-29   Sleep apnea Oct 29, 2007   HEADACHE 10-29-2007   SUDDEN DEATH-aborted 10/29/2007   LOW BACK PAIN SYNDROME 10/11/2007    Lewis Moccasin, PT 09/09/2021, 11:05 AM  [place] MAIN [facility] SERVICES [address] [place], [place], [address] Phone: [phone]   Fax:  [phone]  Name: Larry [name] MRN: [id number] Date of Birth: 08/09/1950

## 2021-09-16 ENCOUNTER — Other Ambulatory Visit: Payer: Self-pay

## 2021-09-16 ENCOUNTER — Ambulatory Visit: Payer: Medicare Other

## 2021-09-16 DIAGNOSIS — R262 Difficulty in walking, not elsewhere classified: Secondary | ICD-10-CM

## 2021-09-16 DIAGNOSIS — M6281 Muscle weakness (generalized): Secondary | ICD-10-CM

## 2021-09-16 DIAGNOSIS — R269 Unspecified abnormalities of gait and mobility: Secondary | ICD-10-CM | POA: Diagnosis not present

## 2021-09-16 DIAGNOSIS — R278 Other lack of coordination: Secondary | ICD-10-CM | POA: Diagnosis not present

## 2021-09-16 DIAGNOSIS — R2681 Unsteadiness on feet: Secondary | ICD-10-CM

## 2021-09-16 NOTE — Therapy (Signed)
[place] [facility] [facility] ?[place], [place], [address] ?Phone: [phone]   Fax:  [phone] ? ?Physical Therapy Treatment ? ?Patient Details  ?Name: Larry [name] ?MRN: [id number] ?Date of Birth: Sep 06, 1950 ?Referring Provider (PT): Dr. Krista Blue ? ? ?Encounter Date: 09/16/2021 ? ? PT End of Session - 09/16/21 0856   ? ? Visit Number 35   ? Number of Visits 39   ? Date for PT Re-Evaluation 10/07/21   ? Authorization Type UHC Medicare   ? Authorization Time Period 01/27/2021- 77/93/9030; Recert 03/27/3006- 62/26/3335; 07/15/2021-10/07/2021   ? PT Start Time [id number]   ? PT Stop Time 0930   ? PT Time Calculation (min) 43 min   ? Equipment Utilized During Treatment Gait belt   ? Activity Tolerance Patient tolerated treatment well   ? Behavior During Therapy [facility] for tasks assessed/performed   ? ?  ?  ? ?  ? ? ?Past Medical History:  ?Diagnosis Date  ? ADD (attention deficit disorder)   ? Allergy   ? seasonal flares of perinneal allergies  ? Cancer [facility])   ? melanoma  ? Cardiac arrest [facility]) 07/1997  ? aborted  ? Dementia ([place])   ? post resusitative  ? Depression   ? Diverticulosis   ? Family hx of prostate cancer   ? GERD (gastroesophageal reflux disease)   ? Headache(784.0)   ? Hyperlipidemia   ? Hypothyroidism   ? ICD (implantable cardiac defibrillator) in place   ? Idiopathic urticaria   ? Internal hemorrhoids   ? Low back pain syndrome   ? Melanoma in situ of skin of trunk ([place])   ? chest  ? PVC (premature ventricular contraction)   ? associated with cardiac arrest  ? Sleep apnea   ? no c-pap  ? Tubular adenoma of colon 10/2010  ? ? ?Past Surgical History:  ?Procedure Laterality Date  ?  implantation x2    ? CARDIAC DEFIBRILLATOR PLACEMENT  07/01/2010  ? Explanation of a previously implanted device, pocket revision, and insertion of a new device, and intraoperative defibrillation threshold testing Caryl Comes)  ? CARDIAC DEFIBRILLATOR REMOVAL  2004  ? Defibrillator change  Caryl Comes) 3 times changed  ? CATARACT EXTRACTION W/PHACO Left 02/04/2021  ? Procedure: CATARACT EXTRACTION PHACO AND INTRAOCULAR LENS PLACEMENT ([place]) LEFT VIVITY toric LENS 9.21 01:11.6;  Surgeon: Leandrew Koyanagi, MD;  Location: [place];  Service: Ophthalmology;  Laterality: Left;  keep patient at 11:30 arrival  ? CATARACT EXTRACTION W/PHACO Right 03/04/2021  ? Procedure: CATARACT EXTRACTION PHACO AND INTRAOCULAR LENS PLACEMENT ([place]) RIGHT VIVITY LENS 8.59 01:15.3;  Surgeon: Leandrew Koyanagi, MD;  Location: [place];  Service: Ophthalmology;  Laterality: Right;  ? HEMORRHOID SURGERY    ? ICD    ? TONSILLECTOMY    ? ? ?There were no vitals filed for this visit. ? ? Subjective Assessment - 09/16/21 0855   ? ? Subjective Patient reports being stiff today but otherwise okay   ? Patient is accompained by: Family member   ? Pertinent History Per note submitted by Dr. Krista Blue on 12/15/2020- The patient has an underlying medical history of cardiac death from ventricular fibrillation with hypoxic brain injury in Jan 1999 due to arrythmia, 15 minutes without heart beat,  status post defibrillator placement, He stayed in hospital for 35 days. On 12/16/2020-Gait abnormality, Also related to the previous anoxic brain injury, deconditioning,   ? Limitations Walking;House hold activities   ? How long can you  sit comfortably? no limitations   ? How long can you stand comfortably? Not really limited   ? How long can you walk comfortably? 15-20   ? Patient Stated Goals To improve transfers, balance and walking.   ? Currently in Pain? No/denies   ? Pain Onset More than a month ago   ? ?  ?  ? ?  ? ? ? ? ?INTERVENITIONS:  ? ?Therex:  ?ROM for reported stiffness:  ? ?Hamstring- BLE - hold 20 sec x 3 sets each ?Lumbar flex stretch- hold 20 sec x 3 sets each ?Piriformis- BLE - hold 20 sec x 2 sets each ? ?Cervical Rotation with towel during rest breaks from hangman ? ? ? ?Dual Task neuro re-ed: ? ?Hangman with  staggered stand (back foot on airex pad and front foot on 4" step)  ?X 3 games focusing on varying stances- staggered, tandem, narrowed feet. - Patient with some difficulty with all positions but particularly tandem.  ?Static standing on 1/2 foam - hold while organizing letter on dry erase/magnet board in alphabetical order x 3 times  ? ?Education provided throughout session via VC/TC and demonstration to facilitate movement at target joints and correct muscle activation for all testing and exercises performed.  ? ? ? ? ? ? ? ? ? ? ? ? ? ? ? ? ? ? PT Education - 09/16/21 0855   ? ? Education Details Stretching technique   ? Person(s) Educated Patient   ? Methods Explanation;Demonstration;Tactile cues;Verbal cues   ? Comprehension Returned demonstration;Verbal cues required;Verbalized understanding;Tactile cues required;Need further instruction   ? ?  ?  ? ?  ? ? ? PT Short Term Goals - 03/18/21 1306   ? ?  ? PT SHORT TERM GOAL #1  ? Title Pt will be independent with HEP in order to improve strength and balance in order to decrease fall risk and improve function at home and work.   ? Baseline 01/27/2021- Patient presents with no formal HEP in place. 03/18/2021- Patient is knowledgeable of HEP- WIfe reports he is not always compliant but not because I don't know what to do."   ? Time 6   ? Period Weeks   ? Status Achieved   ? Target Date 03/10/21   ? ?  ?  ? ?  ? ? ? ? PT Long Term Goals - 08/05/21 1404   ? ?  ? PT LONG TERM GOAL #1  ? Title Pt will improve BERG by at least 5 points in order to demonstrate clinically significant improvement in balance.   ? Baseline 01/27/2021= 43/56; 03/18/2021=50/56   ? Time 12   ? Period Weeks   ? Status Achieved   ?  ? PT LONG TERM GOAL #2  ? Title Pt will improve FOTO to target score of 55% to display perceived improvements in ability to complete ADL's.   ? Baseline 01/27/2021= 42%; 04/22/2021- Will assess next visit; 05/06/2021= 64%   ? Time 12   ? Period Weeks   ? Status Achieved   ?   ? PT LONG TERM GOAL #3  ? Title Pt will decrease 5TSTS by at least 3 seconds in order to demonstrate clinically significant improvement in LE strength.   ? Baseline 01/27/2021= 30.07 without UE support; 03/18/2021= 24.78 sec without UE Support; 04/22/2021= 14.47 sec without UE support; 07/15/2021- 17.43 sec without UE support   ? Time 12   ? Period Weeks   ? Status Achieved   ?  ?  PT LONG TERM GOAL #4  ? Title Pt will decrease TUG to below 15  seconds/decrease in order to demonstrate decreased fall risk.   ? Baseline 01/27/2021= 20.97 without an AD; 03/18/2021= 19.2 sec without AD; 04/22/2021= 17 sec without AD; 05/13/2021= 14.88 sec. 07/15/2021= 14.7 sec avg without AD   ? Time 12   ? Period Weeks   ? Status Achieved   ? Target Date 07/15/21   ?  ? PT LONG TERM GOAL #5  ? Title Pt will increase 10MWT by at least 0.13 m/s in order to demonstrate clinically significant improvement in community ambulation.   ? Baseline 01/27/2021= 0.67 m/s without an AD.  9/14= 0.75 m/s; 04/22/2021= 0.82 sec without an AD- WIll keep goal active to ensure patient can perform consistently. 05/13/2021= 0.89 m/s. 07/15/2021= 0.98 m/s   ? Time 12   ? Period Weeks   ? Status Achieved   ? Target Date 07/15/21   ?  ? PT LONG TERM GOAL #6  ? Title Pt will increase 6MWT by at least 25m(1644f in order to demonstrate clinically significant improvement in cardiopulmonary endurance and community ambulation   ? Baseline 02/03/2021= 1100 feet; 03/18/21= 1125 feet without AD.  04/22/2021= 1175 feet without AD 07/15/2021= 1210 feet without an AD; 08/05/2021= 1190 feet without AD   ? Time 11   ? Period Weeks   ? Status On-going   ? Target Date 10/07/21   ?  ? PT LONG TERM GOAL #7  ? Title Pt will increase 10MWT to 1.0 m/s or greater in order to demonstrate clinically significant improvement in community ambulation.   ? Baseline 07/15/2021= 0.98 m/s   ? Time 12   ? Period Weeks   ? Status New   ? Target Date 10/07/21   ? ?  ?  ? ?  ? ? ? ? ? ? ? ? Plan - 09/16/21 0858    ? ? Clinical Impression Statement Patient participated well - incorporating some ROM activities for flexibility due to reported tightness and balance activities including dual tasking. Patient performed well

## 2021-09-21 ENCOUNTER — Ambulatory Visit: Payer: Medicare Other

## 2021-09-23 ENCOUNTER — Other Ambulatory Visit: Payer: Self-pay

## 2021-09-23 ENCOUNTER — Ambulatory Visit: Payer: Medicare Other

## 2021-09-23 DIAGNOSIS — M6281 Muscle weakness (generalized): Secondary | ICD-10-CM

## 2021-09-23 DIAGNOSIS — R278 Other lack of coordination: Secondary | ICD-10-CM

## 2021-09-23 DIAGNOSIS — R2681 Unsteadiness on feet: Secondary | ICD-10-CM | POA: Diagnosis not present

## 2021-09-23 DIAGNOSIS — R269 Unspecified abnormalities of gait and mobility: Secondary | ICD-10-CM

## 2021-09-23 DIAGNOSIS — R262 Difficulty in walking, not elsewhere classified: Secondary | ICD-10-CM | POA: Diagnosis not present

## 2021-09-23 NOTE — Therapy (Signed)
[facility] ?[place], [place], [address] ?Phone: [phone]   Fax:  [phone] ? ?Physical Therapy Treatment ? ?Patient Details  ?Name: Larry [name] ?MRN: [id number] ?Date of Birth: 10-24-1950 ?Referring Provider (PT): Dr. Krista Blue ? ? ?Encounter Date: 09/23/2021 ? ? PT End of Session - 09/23/21 0846   ? ? Visit Number 36   ? Number of Visits 39   ? Date for PT Re-Evaluation 10/07/21   ? Authorization Type [employer] Medicare   ? Authorization Time Period 01/27/2021- 32/95/1884; Recert 16/60/6301- 60/04/9322; 07/15/2021-10/07/2021   ? PT Start Time [phone]   ? PT Stop Time 0929   ? PT Time Calculation (min) 43 min   ? Equipment Utilized During Treatment Gait belt   ? Activity Tolerance Patient tolerated treatment well   ? Behavior During Therapy [facility] for tasks assessed/performed   ? ?  ?  ? ?  ? ? ?Past Medical History:  ?Diagnosis Date  ? ADD (attention deficit disorder)   ? Allergy   ? seasonal flares of perinneal allergies  ? Cancer [facility])   ? melanoma  ? Cardiac arrest [facility]) 07/1997  ? aborted  ? Dementia ([place])   ? post resusitative  ? Depression   ? Diverticulosis   ? Family hx of prostate cancer   ? GERD (gastroesophageal reflux disease)   ? Headache(784.0)   ? Hyperlipidemia   ? Hypothyroidism   ? ICD (implantable cardiac defibrillator) in place   ? Idiopathic urticaria   ? Internal hemorrhoids   ? Low back pain syndrome   ? Melanoma in situ of skin of trunk ([place])   ? chest  ? PVC (premature ventricular contraction)   ? associated with cardiac arrest  ? Sleep apnea   ? no c-pap  ? Tubular adenoma of colon 10/2010  ? ? ?Past Surgical History:  ?Procedure Laterality Date  ?  implantation x2    ? CARDIAC DEFIBRILLATOR PLACEMENT  07/01/2010  ? Explanation of a previously implanted device, pocket revision, and insertion of a new device, and intraoperative defibrillation threshold testing Caryl Comes)  ? CARDIAC DEFIBRILLATOR REMOVAL  2004  ? Defibrillator change  Caryl Comes) 3 times changed  ? CATARACT EXTRACTION W/PHACO Left 02/04/2021  ? Procedure: CATARACT EXTRACTION PHACO AND INTRAOCULAR LENS PLACEMENT ([place]) LEFT VIVITY toric LENS 9.21 01:11.6;  Surgeon: Leandrew Koyanagi, MD;  Location: [place];  Service: Ophthalmology;  Laterality: Left;  keep patient at 11:30 arrival  ? CATARACT EXTRACTION W/PHACO Right 03/04/2021  ? Procedure: CATARACT EXTRACTION PHACO AND INTRAOCULAR LENS PLACEMENT ([place]) RIGHT VIVITY LENS 8.59 01:15.3;  Surgeon: Leandrew Koyanagi, MD;  Location: [place];  Service: Ophthalmology;  Laterality: Right;  ? HEMORRHOID SURGERY    ? ICD    ? TONSILLECTOMY    ? ? ?There were no vitals filed for this visit. ? ? Subjective Assessment - 09/23/21 0846   ? ? Subjective Patient reports being tired today and states he did not rest well last night- "Nothing wrong- just couldn't sleep."   ? Patient is accompained by: Family member   ? Pertinent History Per note submitted by Dr. Krista Blue on 12/15/2020- The patient has an underlying medical history of cardiac death from ventricular fibrillation with hypoxic brain injury in Jan 1999 due to arrythmia, 15 minutes without heart beat,  status post defibrillator placement, He stayed in hospital for 35 days. On 12/16/2020-Gait abnormality, Also related to the previous anoxic brain injury, deconditioning,   ?  Limitations Walking;House hold activities   ? How long can you sit comfortably? no limitations   ? How long can you stand comfortably? Not really limited   ? How long can you walk comfortably? 15-20   ? Patient Stated Goals To improve transfers, balance and walking.   ? Currently in Pain? No/denies   ? Pain Onset More than a month ago   ? ?  ?  ? ?  ? ? ? ? ?INTERVENTIONS:  ? ?Neuromuscular re-ed:  ? ?Step forward/backward over 1/2 foam x 20 reps.  ? ?Hip ER (side step to left or right) when PT calls out "left" or "right" x approx 4 min. Added "backward" and patient slowed down and mild difficultly  coordinating tasks.  ? ?Side step over 1/2 foam x 10 then added dual task- Instructed to side step to left if called out odd number and to right if even- PT called out numbers for several minutes using addition/subtraction with sum equally odd or even and patient stepping accordingly. He performed with 100% accuracy.  ? ?Side stepping on top of 1/2 foam x length of foam to left/right x 10 each.  ? ?Cone activity- 8 cones spread out- walk up to cone on left side- single leg golfers lean to touch cone, snake around to next cone on opp side and continue x 8 cones. On the way back- stop at each cone and perform squat x 8 cones.  ?2 rounds total- Much improved coordination on 2nd round ? ?Cone activity- Side stepping weaving around 8 cones and back x 2 trials. More difficulty going to left than right with short steps.  ? ?Cone activity- Side step over a cone- then turn 180 deg to side step over next cone- repeat for total of 8 cones. On the way back - performed slow deliberate squat to pick up each cone with good technique.  ? ?Education provided throughout session via VC/TC and demonstration to facilitate movement at target joints and correct muscle activation for all testing and exercises performed.  ? ? ? ? ? ? ? ? ? ? ? ? ? ? ? ? ? ? ? ? ? ? ? ? PT Education - 09/23/21 0846   ? ? Education Details Exercise technique   ? Person(s) Educated Patient   ? Methods Explanation;Demonstration;Tactile cues;Verbal cues   ? Comprehension Verbalized understanding;Returned demonstration;Verbal cues required;Tactile cues required   ? ?  ?  ? ?  ? ? ? PT Short Term Goals - 03/18/21 1306   ? ?  ? PT SHORT TERM GOAL #1  ? Title Pt will be independent with HEP in order to improve strength and balance in order to decrease fall risk and improve function at home and work.   ? Baseline 01/27/2021- Patient presents with no formal HEP in place. 03/18/2021- Patient is knowledgeable of HEP- WIfe reports he is not always compliant but not because  I don't know what to do."   ? Time 6   ? Period Weeks   ? Status Achieved   ? Target Date 03/10/21   ? ?  ?  ? ?  ? ? ? ? PT Long Term Goals - 08/05/21 1404   ? ?  ? PT LONG TERM GOAL #1  ? Title Pt will improve BERG by at least 5 points in order to demonstrate clinically significant improvement in balance.   ? Baseline 01/27/2021= 43/56; 03/18/2021=50/56   ? Time 12   ? Period Weeks   ?  Status Achieved   ?  ? PT LONG TERM GOAL #2  ? Title Pt will improve FOTO to target score of 55% to display perceived improvements in ability to complete ADL's.   ? Baseline 01/27/2021= 42%; 04/22/2021- Will assess next visit; 05/06/2021= 64%   ? Time 12   ? Period Weeks   ? Status Achieved   ?  ? PT LONG TERM GOAL #3  ? Title Pt will decrease 5TSTS by at least 3 seconds in order to demonstrate clinically significant improvement in LE strength.   ? Baseline 01/27/2021= 30.07 without UE support; 03/18/2021= 24.78 sec without UE Support; 04/22/2021= 14.47 sec without UE support; 07/15/2021- 17.43 sec without UE support   ? Time 12   ? Period Weeks   ? Status Achieved   ?  ? PT LONG TERM GOAL #4  ? Title Pt will decrease TUG to below 15  seconds/decrease in order to demonstrate decreased fall risk.   ? Baseline 01/27/2021= 20.97 without an AD; 03/18/2021= 19.2 sec without AD; 04/22/2021= 17 sec without AD; 05/13/2021= 14.88 sec. 07/15/2021= 14.7 sec avg without AD   ? Time 12   ? Period Weeks   ? Status Achieved   ? Target Date 07/15/21   ?  ? PT LONG TERM GOAL #5  ? Title Pt will increase 10MWT by at least 0.13 m/s in order to demonstrate clinically significant improvement in community ambulation.   ? Baseline 01/27/2021= 0.67 m/s without an AD.  9/14= 0.75 m/s; 04/22/2021= 0.82 sec without an AD- WIll keep goal active to ensure patient can perform consistently. 05/13/2021= 0.89 m/s. 07/15/2021= 0.98 m/s   ? Time 12   ? Period Weeks   ? Status Achieved   ? Target Date 07/15/21   ?  ? PT LONG TERM GOAL #6  ? Title Pt will increase 6MWT by at least  24m(1649f in order to demonstrate clinically significant improvement in cardiopulmonary endurance and community ambulation   ? Baseline 02/03/2021= 1100 feet; 03/18/21= 1125 feet without AD.  04/22/2021= 1175 fe

## 2021-09-24 ENCOUNTER — Other Ambulatory Visit: Payer: Self-pay | Admitting: Family Medicine

## 2021-09-24 DIAGNOSIS — E039 Hypothyroidism, unspecified: Secondary | ICD-10-CM

## 2021-09-25 NOTE — Telephone Encounter (Signed)
Patient needs AWV with provider next month. Please call to schedule ?

## 2021-09-30 ENCOUNTER — Other Ambulatory Visit: Payer: Self-pay | Admitting: Family Medicine

## 2021-09-30 ENCOUNTER — Ambulatory Visit: Payer: Medicare Other

## 2021-10-01 NOTE — Telephone Encounter (Signed)
Appts have been scheduled.  

## 2021-10-01 NOTE — Telephone Encounter (Signed)
Patient due for AWV with Dr. Damita Dunnings next month. Please call to schedule appt.  ?

## 2021-10-01 NOTE — Telephone Encounter (Signed)
Refill request for AMPHETAMINE SALTS '10MG'$  TAB(GEN ADDERALL) ? ?LOV - 10/07/20 ?Next OV - not scheduled yet ?Last refill - 06/19/21 #180/0 ? ?

## 2021-10-04 NOTE — Telephone Encounter (Signed)
Sent. Thanks.  ?Please schedule yearly visit.  ?

## 2021-10-05 ENCOUNTER — Ambulatory Visit (INDEPENDENT_AMBULATORY_CARE_PROVIDER_SITE_OTHER): Payer: Medicare Other

## 2021-10-05 VITALS — Ht 69.0 in | Wt 176.0 lb

## 2021-10-05 DIAGNOSIS — Z0000 Encounter for general adult medical examination without abnormal findings: Secondary | ICD-10-CM | POA: Diagnosis not present

## 2021-10-05 NOTE — Patient Instructions (Signed)
Larry [name] , ?Thank you for taking time to come for your Medicare Wellness Visit. I appreciate your ongoing commitment to your health goals. Please review the following plan we discussed and let me know if I can assist you in the future.  ? ?Screening recommendations/referrals: ?Colonoscopy: Done 11/24/2015 Repeat in 5 years ? ?Recommended yearly ophthalmology/optometry visit for glaucoma screening and checkup ?Recommended yearly dental visit for hygiene and checkup ? ?Vaccinations: ?Influenza vaccine: Done 06/23/2021. Repeat annually ? ?Pneumococcal vaccine: Done 08/03/2017 and 08/14/2018 ?Tdap vaccine: Done 11/20/2008 Repeat in 10 years ? ?Shingles vaccine: Done 09/19/2021.    ?Covid-19: Done 08/21/2019, 07/31/2019 ? ?Advanced directives: Please bring a copy of your health care power of attorney and living will to the office to be added to your chart at your convenience. ? ? ?Conditions/risks identified: Aim for 30 minutes of exercise or brisk walking, 6-8 glasses of water, and 5 servings of fruits and vegetables each day. ? ? ?Next appointment: Follow up in one year for your annual wellness visit. 2024. ? ?Preventive Care 42 Years and Older, Male ? ?Preventive care refers to lifestyle choices and visits with your health care provider that can promote health and wellness. ?What does preventive care include? ?A yearly physical exam. This is also called an annual well check. ?Dental exams once or twice a year. ?Routine eye exams. Ask your health care provider how often you should have your eyes checked. ?Personal lifestyle choices, including: ?Daily care of your teeth and gums. ?Regular physical activity. ?Eating a healthy diet. ?Avoiding tobacco and drug use. ?Limiting alcohol use. ?Practicing safe sex. ?Taking low doses of aspirin every day. ?Taking vitamin and mineral supplements as recommended by your health care provider. ?What happens during an annual well check? ?The services and screenings done by your health care  provider during your annual well check will depend on your age, overall health, lifestyle risk factors, and family history of disease. ?Counseling  ?Your health care provider may ask you questions about your: ?Alcohol use. ?Tobacco use. ?Drug use. ?Emotional well-being. ?Home and relationship well-being. ?Sexual activity. ?Eating habits. ?History of falls. ?Memory and ability to understand (cognition). ?Work and work Statistician. ?Screening  ?You may have the following tests or measurements: ?Height, weight, and BMI. ?Blood pressure. ?Lipid and cholesterol levels. These may be checked every 5 years, or more frequently if you are over 15 years old. ?Skin check. ?Lung cancer screening. You may have this screening every year starting at age 29 if you have a 30-pack-year history of smoking and currently smoke or have quit within the past 15 years. ?Fecal occult blood test (FOBT) of the stool. You may have this test every year starting at age 40. ?Flexible sigmoidoscopy or colonoscopy. You may have a sigmoidoscopy every 5 years or a colonoscopy every 10 years starting at age 24. ?Prostate cancer screening. Recommendations will vary depending on your family history and other risks. ?Hepatitis C blood test. ?Hepatitis B blood test. ?Sexually transmitted disease (STD) testing. ?Diabetes screening. This is done by checking your blood sugar (glucose) after you have not eaten for a while (fasting). You may have this done every 1-3 years. ?Abdominal aortic aneurysm (AAA) screening. You may need this if you are a current or former smoker. ?Osteoporosis. You may be screened starting at age 42 if you are at high risk. ?Talk with your health care provider about your test results, treatment options, and if necessary, the need for more tests. ?Vaccines  ?Your health care provider may recommend  certain vaccines, such as: ?Influenza vaccine. This is recommended every year. ?Tetanus, diphtheria, and acellular pertussis (Tdap, Td)  vaccine. You may need a Td booster every 10 years. ?Zoster vaccine. You may need this after age 34. ?Pneumococcal 13-valent conjugate (PCV13) vaccine. One dose is recommended after age 56. ?Pneumococcal polysaccharide (PPSV23) vaccine. One dose is recommended after age 31. ?Talk to your health care provider about which screenings and vaccines you need and how often you need them. ?This information is not intended to replace advice given to you by your health care provider. Make sure you discuss any questions you have with your health care provider. ?Document Released: 07/18/2015 Document Revised: 03/10/2016 Document Reviewed: 04/22/2015 ?Elsevier Interactive Patient Education ? 2017 [place]. ? ?Fall Prevention in the Home ?Falls can cause injuries. They can happen to people of all ages. There are many things you can do to make your home safe and to help prevent falls. ?What can I do on the outside of my home? ?Regularly fix the edges of walkways and driveways and fix any cracks. ?Remove anything that might make you trip as you walk through a door, such as a raised step or threshold. ?Trim any bushes or trees on the path to your home. ?Use bright outdoor lighting. ?Clear any walking paths of anything that might make someone trip, such as rocks or tools. ?Regularly check to see if handrails are loose or broken. Make sure that both sides of any steps have handrails. ?Any raised decks and porches should have guardrails on the edges. ?Have any leaves, snow, or ice cleared regularly. ?Use sand or salt on walking paths during winter. ?Clean up any spills in your garage right away. This includes oil or grease spills. ?What can I do in the bathroom? ?Use night lights. ?Install grab bars by the toilet and in the tub and shower. Do not use towel bars as grab bars. ?Use non-skid mats or decals in the tub or shower. ?If you need to sit down in the shower, use a plastic, non-slip stool. ?Keep the floor dry. Clean up any  water that spills on the floor as soon as it happens. ?Remove soap buildup in the tub or shower regularly. ?Attach bath mats securely with double-sided non-slip rug tape. ?Do not have throw rugs and other things on the floor that can make you trip. ?What can I do in the bedroom? ?Use night lights. ?Make sure that you have a light by your bed that is easy to reach. ?Do not use any sheets or blankets that are too big for your bed. They should not hang down onto the floor. ?Have a firm chair that has side arms. You can use this for support while you get dressed. ?Do not have throw rugs and other things on the floor that can make you trip. ?What can I do in the kitchen? ?Clean up any spills right away. ?Avoid walking on wet floors. ?Keep items that you use a lot in easy-to-reach places. ?If you need to reach something above you, use a strong step stool that has a grab bar. ?Keep electrical cords out of the way. ?Do not use floor polish or wax that makes floors slippery. If you must use wax, use non-skid floor wax. ?Do not have throw rugs and other things on the floor that can make you trip. ?What can I do with my stairs? ?Do not leave any items on the stairs. ?Make sure that there are handrails on both sides of the  stairs and use them. Fix handrails that are broken or loose. Make sure that handrails are as long as the stairways. ?Check any carpeting to make sure that it is firmly attached to the stairs. Fix any carpet that is loose or worn. ?Avoid having throw rugs at the top or bottom of the stairs. If you do have throw rugs, attach them to the floor with carpet tape. ?Make sure that you have a light switch at the top of the stairs and the bottom of the stairs. If you do not have them, ask someone to add them for you. ?What else can I do to help prevent falls? ?Wear shoes that: ?Do not have high heels. ?Have rubber bottoms. ?Are comfortable and fit you well. ?Are closed at the toe. Do not wear sandals. ?If you use a  stepladder: ?Make sure that it is fully opened. Do not climb a closed stepladder. ?Make sure that both sides of the stepladder are locked into place. ?Ask someone to hold it for you, if possible. ?Clearly mark a

## 2021-10-05 NOTE — Progress Notes (Signed)
? ?Subjective:  ? Larry [name] is a 71 y.o. male who presents for Medicare Annual/Subsequent preventive examination. ?Virtual Visit via Telephone Note ? ?I connected with  Larry [name] on 10/05/21 at  2:45 PM EDT by telephone and verified that I am speaking with the correct person using two identifiers. ? ?Location: ?Patient: HOME ?Provider: [facility] ?Persons participating in the virtual visit: patient/Nurse Health Advisor ?  ?I discussed the limitations, risks, security and privacy concerns of performing an evaluation and management service by telephone and the availability of in person appointments. The patient expressed understanding and agreed to proceed. ? ?Interactive audio and video telecommunications were attempted between this nurse and patient, however failed, due to patient having technical difficulties OR patient did not have access to video capability.  We continued and completed visit with audio only. ? ?Some vital signs may be absent or patient reported.  ? ?Larry Driver, LPN ? ?Review of Systems    ? ?Cardiac Risk Factors include: advanced age (>36mn, >>40women);hypertension;dyslipidemia;male gender;sedentary lifestyle ? ?   ?Objective:  ?  ?Today's Vitals  ? 10/05/21 1446  ?Weight: 176 lb (79.8 kg)  ?Height: '5\' 9"'$  (1.753 m)  ? ?Body mass index is 25.99 kg/m?. ? ? ?  10/05/2021  ?  2:54 PM 03/04/2021  ?  9:22 AM 02/04/2021  ? 11:35 AM 01/27/2021  ?  8:10 AM 10/03/2020  ?  2:41 PM 09/28/2020  ?  3:34 PM 08/29/2019  ?  1:57 PM  ?Advanced Directives  ?Does Patient Have a Medical Advance Directive? Yes Yes Yes Yes No Yes Yes  ?Type of AParamed[place]of [place]Guide [place]Living will [place]Living will [place]Living will [place]Living will  Living will [place]Living will  ?Does patient want to make changes to medical advance directive?  No - Patient declined No - Patient declined      ?Copy of [place]in Chart? No - copy requested Yes - validated most recent copy scanned in chart (See row information) Yes - validated most recent copy scanned in chart (See row information) No - copy requested   No - copy requested  ?Would patient like information on creating a medical advance directive?     Yes (MAU/Ambulatory/Procedural Areas - Information given)    ? ? ?Current Medications (verified) ?Outpatient Encounter Medications as of 10/05/2021  ?Medication Sig  ? amphetamine-dextroamphetamine (ADDERALL) 10 MG tablet TAKE ONE TABLET BY MOUTH TWICE A DAY  ? Ascorbic Acid (VITAMIN C) 500 MG tablet Take 500 mg by mouth daily.  ? aspirin 81 MG tablet Take 81 mg by mouth daily.  ? Cholecalciferol (VITAMIN D3) 10 MCG (400 UNIT) tablet Take 400 Units by mouth daily.  ? diltiazem (CARDIZEM CD) 120 MG 24 hr capsule TAKE 1 CAPSULE BY MOUTH  DAILY  ? donepezil (ARICEPT) 10 MG tablet TAKE 1 TABLET BY MOUTH  DAILY  ? fexofenadine (ALLEGRA) 180 MG tablet Take 180 mg by mouth daily.  ? Ginkgo Biloba 40 MG TABS Take by mouth as directed.  ? lamoTRIgine (LAMICTAL) 100 MG tablet TAKE 1 TABLET BY MOUTH  TWICE DAILY  ? levothyroxine (SYNTHROID) 137 MCG tablet TAKE 1 TABLET BY MOUTH  DAILY BEFORE BREAKFAST  ? Melatonin 5 MG CAPS Take by mouth as directed.  ? memantine (NAMENDA) 10 MG tablet TAKE 1 TABLET BY MOUTH  TWICE DAILY  ? metoprolol succinate (TOPROL-XL) 25 MG 24 hr tablet  TAKE 1 TABLET BY MOUTH  DAILY  ? montelukast (SINGULAIR) 10 MG tablet TAKE 1 TABLET BY MOUTH  DAILY  ? Multiple Vitamin (MULTIVITAMIN) tablet Take 700 tablets by mouth daily.  ? NON FORMULARY Focus factor ?1 tab po bid  ? rosuvastatin (CRESTOR) 20 MG tablet TAKE 1 TABLET BY MOUTH AT  BEDTIME  ? vitamin B-12 (CYANOCOBALAMIN) 1000 MCG tablet Take 1,000 mcg by mouth daily.  ? vitamin E 180 MG (400 UNITS) capsule Take 400 Units by mouth daily.  ? [DISCONTINUED] amphetamine-dextroamphetamine (ADDERALL) 10 MG tablet TAKE ONE TABLET BY MOUTH TWICE A DAY   ? ?No facility-administered encounter medications on file as of 10/05/2021.  ? ? ?Allergies (verified) ?Penicillins  ? ?History: ?Past Medical History:  ?Diagnosis Date  ? ADD (attention deficit disorder)   ? Allergy   ? seasonal flares of perinneal allergies  ? Cancer [facility])   ? melanoma  ? Cardiac arrest [facility]) 07/1997  ? aborted  ? Dementia ([place])   ? post resusitative  ? Depression   ? Diverticulosis   ? Family hx of prostate cancer   ? GERD (gastroesophageal reflux disease)   ? Headache(784.0)   ? Hyperlipidemia   ? Hypothyroidism   ? ICD (implantable cardiac defibrillator) in place   ? Idiopathic urticaria   ? Internal hemorrhoids   ? Low back pain syndrome   ? Melanoma in situ of skin of trunk ([place])   ? chest  ? PVC (premature ventricular contraction)   ? associated with cardiac arrest  ? Sleep apnea   ? no c-pap  ? Tubular adenoma of colon 10/2010  ? ?Past Surgical History:  ?Procedure Laterality Date  ?  implantation x2    ? CARDIAC DEFIBRILLATOR PLACEMENT  07/01/2010  ? Explanation of a previously implanted device, pocket revision, and insertion of a new device, and intraoperative defibrillation threshold testing Caryl Comes)  ? CARDIAC DEFIBRILLATOR REMOVAL  2004  ? Defibrillator change Caryl Comes) 3 times changed  ? CATARACT EXTRACTION W/PHACO Left 02/04/2021  ? Procedure: CATARACT EXTRACTION PHACO AND INTRAOCULAR LENS PLACEMENT ([place]) LEFT VIVITY toric LENS 9.21 01:11.6;  Surgeon: Leandrew Koyanagi, MD;  Location: [place];  Service: Ophthalmology;  Laterality: Left;  keep patient at 11:30 arrival  ? CATARACT EXTRACTION W/PHACO Right 03/04/2021  ? Procedure: CATARACT EXTRACTION PHACO AND INTRAOCULAR LENS PLACEMENT ([place]) RIGHT VIVITY LENS 8.59 01:15.3;  Surgeon: Leandrew Koyanagi, MD;  Location: [place];  Service: Ophthalmology;  Laterality: Right;  ? HEMORRHOID SURGERY    ? ICD    ? TONSILLECTOMY    ? ?Family History  ?Problem Relation Age of Onset  ? Hypertension Mother   ? Heart disease  Father   ? Diverticulosis Father   ? Coronary artery disease Father   ? Alopecia Father   ? Prostate cancer Brother   ? Lung cancer Paternal Grandfather   ?     Smoker  ? Cancer Paternal Grandfather   ?     lung cancer - smoker  ? Diabetes Brother   ? Heart disease Brother   ?     heart transplant  ? Colon cancer Neg Hx   ? ?Social History  ? ?Socioeconomic History  ? Marital status: Married  ?  Spouse name: Larry [name]  ? Number of children: 2  ? Years of education: College  ? Highest education level: Not on file  ?Occupational History  ?  Employer: DISABLED  ?Tobacco Use  ? Smoking status: Former  ?  Packs/day: 1.00  ?  Years: 10.00  ?  Pack years: 10.00  ?  Types: Cigarettes  ?  Quit date: 07/06/1995  ?  Years since quitting: 26.2  ? Smokeless tobacco: Former  ?  Types: Chew  ?  Quit date: 11/04/1994  ?Vaping Use  ? Vaping Use: Never used  ?Substance and Sexual Activity  ? Alcohol use: No  ?  Alcohol/week: 0.0 standard drinks  ?  Comment: socially  ? Drug use: No  ? Sexual activity: Not on file  ?Other Topics Concern  ? Not on file  ?Social History Narrative  ? Patient is married Larry [name]) [phone] with 2 [name]  ? 5 grandchildren  ? Daily caffeine use - one cup of coffee and one-two cokes per day  ? Patient is right-handed.  ? Patient has a college education  ? ?Social Determinants of Health  ? ?Financial Resource Strain: Low Risk   ? Difficulty of Paying Living Expenses: Not hard at all  ?Food Insecurity: No Food Insecurity  ? Worried About Charity fundraiser in the Last Year: Never true  ? Ran Out of Food in the Last Year: Never true  ?Transportation Needs: No Transportation Needs  ? Lack of Transportation (Medical): No  ? Lack of Transportation (Non-Medical): No  ?Physical Activity: Insufficiently Active  ? Days of Exercise per Week: 4 days  ? Minutes of Exercise per Session: 30 min  ?Stress: No Stress Concern Present  ? Feeling of Stress : Not at all  ?Social Connections: Socially Integrated  ? Frequency of  Communication with Friends and Family: More than three times a week  ? Frequency of Social Gatherings with Friends and Family: More than three times a week  ? Attends Religious Services: More than 4 times per year  ? A

## 2021-10-06 ENCOUNTER — Ambulatory Visit: Payer: Medicare Other | Attending: Neurology

## 2021-10-06 DIAGNOSIS — R278 Other lack of coordination: Secondary | ICD-10-CM | POA: Insufficient documentation

## 2021-10-06 DIAGNOSIS — R269 Unspecified abnormalities of gait and mobility: Secondary | ICD-10-CM | POA: Insufficient documentation

## 2021-10-06 DIAGNOSIS — R2689 Other abnormalities of gait and mobility: Secondary | ICD-10-CM | POA: Diagnosis not present

## 2021-10-06 DIAGNOSIS — R262 Difficulty in walking, not elsewhere classified: Secondary | ICD-10-CM | POA: Insufficient documentation

## 2021-10-06 DIAGNOSIS — M6281 Muscle weakness (generalized): Secondary | ICD-10-CM | POA: Insufficient documentation

## 2021-10-06 DIAGNOSIS — R2681 Unsteadiness on feet: Secondary | ICD-10-CM | POA: Diagnosis not present

## 2021-10-06 DIAGNOSIS — R296 Repeated falls: Secondary | ICD-10-CM | POA: Diagnosis not present

## 2021-10-06 NOTE — Therapy (Signed)
[place] [facility] ?[place], [place], [address] ?Phone: [phone]   Fax:  [phone] ? ?Physical Therapy Treatment/Recertification for dates 4/4/023- 12/29/2021 ? ?Patient Details  ?Name: Larry [name] ?MRN: [id number] ?Date of Birth: [birth year] ?Referring Provider (PT): Dr. Krista Blue ? ? ?Encounter Date: 10/06/2021 ? ? PT End of Session - 10/07/21 2313   ? ? Visit Number 52   ? Number of Visits 49   ? Date for PT Re-Evaluation 12/29/21   ? Authorization Type [employer] Medicare   ? Authorization Time Period 01/27/2021- 81/15/7262; Recert 03/55/9741- 63/84/5364; 07/15/2021-10/07/2021; 10/06/2021- 12/29/2021   ? Progress Note Due on Visit 40   ? PT Start Time 1430   ? PT Stop Time 6803   ? PT Time Calculation (min) 44 min   ? Equipment Utilized During Treatment Gait belt   ? Activity Tolerance Patient tolerated treatment well   ? Behavior During Therapy [facility] for tasks assessed/performed   ? ?  ?  ? ?  ? ? ? ?Past Medical History:  ?Diagnosis Date  ? ADD (attention deficit disorder)   ? Allergy   ? seasonal flares of perinneal allergies  ? Cancer [facility])   ? melanoma  ? Cardiac arrest [facility]) 07/1997  ? aborted  ? Dementia ([place])   ? post resusitative  ? Depression   ? Diverticulosis   ? Family hx of prostate cancer   ? GERD (gastroesophageal reflux disease)   ? Headache(784.0)   ? Hyperlipidemia   ? Hypothyroidism   ? ICD (implantable cardiac defibrillator) in place   ? Idiopathic urticaria   ? Internal hemorrhoids   ? Low back pain syndrome   ? Melanoma in situ of skin of trunk ([place])   ? chest  ? PVC (premature ventricular contraction)   ? associated with cardiac arrest  ? Sleep apnea   ? no c-pap  ? Tubular adenoma of colon 10/2010  ? ? ?Past Surgical History:  ?Procedure Laterality Date  ?  implantation x2    ? CARDIAC DEFIBRILLATOR PLACEMENT  07/01/2010  ? Explanation of a previously implanted device, pocket revision, and insertion of a new device, and intraoperative  defibrillation threshold testing Caryl Comes)  ? CARDIAC DEFIBRILLATOR REMOVAL  2004  ? Defibrillator change Caryl Comes) 3 times changed  ? CATARACT EXTRACTION W/PHACO Left 02/04/2021  ? Procedure: CATARACT EXTRACTION PHACO AND INTRAOCULAR LENS PLACEMENT ([place]) LEFT VIVITY toric LENS 9.21 01:11.6;  Surgeon: Leandrew Koyanagi, MD;  Location: [place];  Service: Ophthalmology;  Laterality: Left;  keep patient at 11:30 arrival  ? CATARACT EXTRACTION W/PHACO Right 03/04/2021  ? Procedure: CATARACT EXTRACTION PHACO AND INTRAOCULAR LENS PLACEMENT ([place]) RIGHT VIVITY LENS 8.59 01:15.3;  Surgeon: Leandrew Koyanagi, MD;  Location: [place];  Service: Ophthalmology;  Laterality: Right;  ? HEMORRHOID SURGERY    ? ICD    ? TONSILLECTOMY    ? ? ?There were no vitals filed for this visit. ? ? Subjective Assessment - 10/06/21 2255   ? ? Subjective Patient reports feeling very stiff overall- neck, trunk and legs and states he feels it inhibits his mobility.   ? Patient is accompained by: Family member   ? Pertinent History Per note submitted by Dr. Krista Blue on 12/15/2020- The patient has an underlying medical history of cardiac death from ventricular fibrillation with hypoxic brain injury in Jan 1999 due to arrythmia, 15 minutes without heart beat,  status post defibrillator placement, He stayed in hospital for 35 days.  On 12/16/2020-Gait abnormality, Also related to the previous anoxic brain injury, deconditioning,   ? Limitations Walking;House hold activities   ? How long can you sit comfortably? no limitations   ? How long can you stand comfortably? Not really limited   ? How long can you walk comfortably? 15-20   ? Patient Stated Goals To improve transfers, balance and walking.   ? Currently in Pain? No/denies   ? Pain Onset More than a month ago   ? ?  ?  ? ?  ? ? ? ? ?INTERVENTIONS:  ? ? ? ?Reassessed GOALS for recert visit:  ? ?Patient reports having some difficulty with flexibility feeling tight in his neck, trunk,  LE's  ? ? ? ?FOTO= 75 (improved from 64%)  ? ?10 MWT= 9.23 sec= 1.1 m/s without AD ? ?6 min walk= 1250 feet ? ?Hamstring length= 48 deg L; 55 deg R ? ?Knee to chest= tight ?Lower trunk rotation- Limited ?Piriformisl ? ?Cervical Rotation- L=22/R=28 deg ? ? ? ? ? ? ?Access Code: [email or web address] ?URL: [email or web address] ?Date: 10/06/2021 ?Prepared by: Sande Brothers ? ?Exercises ?- Seated Assisted Cervical Rotation with Towel  - 1 x daily - 7 x weekly - 3 sets - 20-30 hold ?- Seated Cervical Rotation AROM  - 1 x daily - 7 x weekly - 3 sets - 20-30 hold ?- Seated Hamstring Stretch  - 1 x daily - 7 x weekly - 3 sets - 20-30 hold ?- Supine Single Knee to Chest Stretch  - 1 x daily - 7 x weekly - 3 sets - 20-30 hold ?- Seated Piriformis Stretch with Trunk Bend  - 1 x daily - 7 x weekly - 3 sets - 20-30 hold ?- Supine Lower Trunk Rotation  - 1 x daily - 7 x weekly - 3 sets - 20-30 hold ? ? ? ?  ? ? ? PT Education - 10/07/21 2255   ? ? Education Details PT Plan of care   ? Person(s) Educated Patient   ? Methods Explanation;Demonstration;Tactile cues;Verbal cues   ? Comprehension Verbalized understanding;Returned demonstration;Verbal cues required;Tactile cues required;Need further instruction   ? ?  ?  ? ?  ? ? ? PT Short Term Goals - 03/18/21 1306   ? ?  ? PT SHORT TERM GOAL #1  ? Title Pt will be independent with HEP in order to improve strength and balance in order to decrease fall risk and improve function at home and work.   ? Baseline 01/27/2021- Patient presents with no formal HEP in place. 03/18/2021- Patient is knowledgeable of HEP- WIfe reports he is not always compliant but not because I don't know what to do."   ? Time 6   ? Period Weeks   ? Status Achieved   ? Target Date 03/10/21   ? ?  ?  ? ?  ? ? ? ? PT Long Term Goals - 10/06/21 1442   ? ?  ? PT LONG TERM GOAL #1  ? Title Pt will improve BERG by at least 5 points in order to demonstrate clinically significant improvement in balance.   ? Baseline  01/27/2021= 43/56; 03/18/2021=50/56   ? Time 12   ? Period Weeks   ? Status Achieved   ?  ? PT LONG TERM GOAL #2  ? Title Pt will improve FOTO to target score of 55% to display perceived improvements in ability to complete ADL's.   ? Baseline 01/27/2021= 42%; 04/22/2021- Will assess next visit; 05/06/2021= 64%   ?  Time 12   ? Period Weeks   ? Status Achieved   ?  ? PT LONG TERM GOAL #3  ? Title Pt will decrease 5TSTS by at least 3 seconds in order to demonstrate clinically significant improvement in LE strength.   ? Baseline 01/27/2021= 30.07 without UE support; 03/18/2021= 24.78 sec without UE Support; 04/22/2021= 14.47 sec without UE support; 07/15/2021- 17.43 sec without UE support   ? Time 12   ? Period Weeks   ? Status Achieved   ?  ? PT LONG TERM GOAL #4  ? Title Pt will decrease TUG to below 15  seconds/decrease in order to demonstrate decreased fall risk.   ? Baseline 01/27/2021= 20.97 without an AD; 03/18/2021= 19.2 sec without AD; 04/22/2021= 17 sec without AD; 05/13/2021= 14.88 sec. 07/15/2021= 14.7 sec avg without AD   ? Time 12   ? Period Weeks   ? Status Achieved   ? Target Date 07/15/21   ?  ? PT LONG TERM GOAL #5  ? Title Pt will increase 10MWT by at least 0.13 m/s in order to demonstrate clinically significant improvement in community ambulation.   ? Baseline 01/27/2021= 0.67 m/s without an AD.  9/14= 0.75 m/s; 04/22/2021= 0.82 sec without an AD- WIll keep goal active to ensure patient can perform consistently. 05/13/2021= 0.89 m/s. 07/15/2021= 0.98 m/s   ? Time 12   ? Period Weeks   ? Status Achieved   ? Target Date 07/15/21   ?  ? Additional Long Term Goals  ? Additional Long Term Goals Yes   ?  ? PT LONG TERM GOAL #6  ? Title Pt will increase 6MWT by at least 84m(1611f in order to demonstrate clinically significant improvement in cardiopulmonary endurance and community ambulation   ? Baseline 02/03/2021= 1100 feet; 03/18/21= 1125 feet without AD.  04/22/2021= 1175 feet without AD 07/15/2021= 1210 feet without an  AD; 08/05/2021= 1190 feet without AD; 10/06/2021= 1250 feet   ? Time 11   ? Period Weeks   ? Status On-going   ? Target Date 12/29/21   ?  ? PT LONG TERM GOAL #7  ? Title Pt will increase 10MWT to 1.0 m/s or gr

## 2021-10-09 ENCOUNTER — Ambulatory Visit (INDEPENDENT_AMBULATORY_CARE_PROVIDER_SITE_OTHER): Payer: Medicare Other

## 2021-10-09 DIAGNOSIS — I4729 Other ventricular tachycardia: Secondary | ICD-10-CM

## 2021-10-12 LAB — CUP PACEART REMOTE DEVICE CHECK
Battery Remaining Longevity: 30 mo
Battery Remaining Percentage: 32 %
Brady Statistic RV Percent Paced: 1 %
Date Time Interrogation Session: 20230407100400
HighPow Impedance: 62 Ohm
Implantable Lead Implant Date: 19990204
Implantable Lead Location: 753860
Implantable Lead Model: 135
Implantable Lead Serial Number: 301213
Implantable Pulse Generator Implant Date: 20111228
Lead Channel Impedance Value: 798 Ohm
Lead Channel Pacing Threshold Amplitude: 0.9 V
Lead Channel Pacing Threshold Pulse Width: 1 ms
Lead Channel Setting Pacing Amplitude: 2.4 V
Lead Channel Setting Pacing Pulse Width: 1 ms
Lead Channel Setting Sensing Sensitivity: 0.4 mV
Pulse Gen Serial Number: 277857

## 2021-10-14 ENCOUNTER — Ambulatory Visit: Payer: Medicare Other

## 2021-10-14 DIAGNOSIS — R278 Other lack of coordination: Secondary | ICD-10-CM | POA: Diagnosis not present

## 2021-10-14 DIAGNOSIS — R269 Unspecified abnormalities of gait and mobility: Secondary | ICD-10-CM

## 2021-10-14 DIAGNOSIS — R2689 Other abnormalities of gait and mobility: Secondary | ICD-10-CM | POA: Diagnosis not present

## 2021-10-14 DIAGNOSIS — R296 Repeated falls: Secondary | ICD-10-CM | POA: Diagnosis not present

## 2021-10-14 DIAGNOSIS — M6281 Muscle weakness (generalized): Secondary | ICD-10-CM | POA: Diagnosis not present

## 2021-10-14 DIAGNOSIS — R2681 Unsteadiness on feet: Secondary | ICD-10-CM

## 2021-10-14 DIAGNOSIS — R262 Difficulty in walking, not elsewhere classified: Secondary | ICD-10-CM | POA: Diagnosis not present

## 2021-10-14 NOTE — Therapy (Signed)
[facility] ?[facility] ?[place], [place], [address] ?Phone: [phone]   Fax:  [phone] ? ?Physical Therapy Treatment ? ?Patient Details  ?Name: Larry [name] ?MRN: [id number] ?Date of Birth: Sep 05, 1950 ?Referring Provider (PT): Dr. Krista Blue ? ? ?Encounter Date: 10/14/2021 ? ? PT End of Session - 10/14/21 1041   ? ? Visit Number 38   ? Number of Visits 49   ? Date for PT Re-Evaluation 12/29/21   ? Authorization Type [employer] Medicare   ? Authorization Time Period 01/27/2021- 09/73/5329; Recert 92/42/6834- 19/62/2297; 07/15/2021-10/07/2021; 10/06/2021- 12/29/2021   ? Progress Note Due on Visit 40   ? PT Start Time 1015   ? PT Stop Time 1059   ? PT Time Calculation (min) 44 min   ? Equipment Utilized During Treatment Gait belt   ? Activity Tolerance Patient tolerated treatment well   ? Behavior During Therapy [facility] for tasks assessed/performed   ? ?  ?  ? ?  ? ? ?Past Medical History:  ?Diagnosis Date  ? ADD (attention deficit disorder)   ? Allergy   ? seasonal flares of perinneal allergies  ? Cancer [facility])   ? melanoma  ? Cardiac arrest [facility]) 07/1997  ? aborted  ? Dementia ([place])   ? post resusitative  ? Depression   ? Diverticulosis   ? Family hx of prostate cancer   ? GERD (gastroesophageal reflux disease)   ? Headache(784.0)   ? Hyperlipidemia   ? Hypothyroidism   ? ICD (implantable cardiac defibrillator) in place   ? Idiopathic urticaria   ? Internal hemorrhoids   ? Low back pain syndrome   ? Melanoma in situ of skin of trunk ([place])   ? chest  ? PVC (premature ventricular contraction)   ? associated with cardiac arrest  ? Sleep apnea   ? no c-pap  ? Tubular adenoma of colon 10/2010  ? ? ?Past Surgical History:  ?Procedure Laterality Date  ?  implantation x2    ? CARDIAC DEFIBRILLATOR PLACEMENT  07/01/2010  ? Explanation of a previously implanted device, pocket revision, and insertion of a new device, and intraoperative defibrillation threshold testing Caryl Comes)  ? CARDIAC  DEFIBRILLATOR REMOVAL  2004  ? Defibrillator change Caryl Comes) 3 times changed  ? CATARACT EXTRACTION W/PHACO Left 02/04/2021  ? Procedure: CATARACT EXTRACTION PHACO AND INTRAOCULAR LENS PLACEMENT ([place]) LEFT VIVITY toric LENS 9.21 01:11.6;  Surgeon: Leandrew Koyanagi, MD;  Location: [place];  Service: Ophthalmology;  Laterality: Left;  keep patient at 11:30 arrival  ? CATARACT EXTRACTION W/PHACO Right 03/04/2021  ? Procedure: CATARACT EXTRACTION PHACO AND INTRAOCULAR LENS PLACEMENT ([place]) RIGHT VIVITY LENS 8.59 01:15.3;  Surgeon: Leandrew Koyanagi, MD;  Location: [place];  Service: Ophthalmology;  Laterality: Right;  ? HEMORRHOID SURGERY    ? ICD    ? TONSILLECTOMY    ? ? ?There were no vitals filed for this visit. ? ? Subjective Assessment - 10/14/21 1024   ? ? Subjective Patient reports his neck pain is stiff, put voltaran cream on it.   ? Patient is accompained by: Family member   ? Pertinent History Per note submitted by Dr. Krista Blue on 12/15/2020- The patient has an underlying medical history of cardiac death from ventricular fibrillation with hypoxic brain injury in Jan 1999 due to arrythmia, 15 minutes without heart beat,  status post defibrillator placement, He stayed in hospital for 35 days. On 12/16/2020-Gait abnormality, Also related to the previous anoxic brain injury,  deconditioning,   ? Limitations Walking;House hold activities   ? How long can you sit comfortably? no limitations   ? How long can you stand comfortably? Not really limited   ? How long can you walk comfortably? 15-20   ? Patient Stated Goals To improve transfers, balance and walking.   ? Currently in Pain? Yes   ? Pain Score 7    ? Pain Location Neck   ? Pain Orientation Right   ? Pain Descriptors / Indicators Aching   ? Pain Type Chronic pain   ? Pain Onset More than a month ago   ? Pain Frequency Intermittent   ? ?  ?  ? ?  ? ? ? ? ?INTERVENTIONS:  ?  ?Neuromuscular re-ed:  ?  ?Airex pad: vertical head turns with  horizontal head turns  with visual scans for dual task with word game x 4 minutes  ? ?Step forward/backward over 1/2 foam x 20 reps.  ?  ?Speed ladder: ?-one foot each square 2x length ; frequent instability ?-lateral stepping 2 feet each square 2x length ?-lateral two feet in two feet out ; very challenging  ?-diagonal in/out two feet  2 lengths  ? ? ?Bosu ball: flat side up:  ?-static stand 30 seconds ? ?Bosu ball round side up: ?-lateral lunge 10x each LE  ?-modified forward lunge 10x each LE SUE support ? ?Trigger Point Dry Needling (TDN), unbilled ?Education performed with patient regarding potential benefit of TDN. Reviewed precautions and risks with patient. Reviewed special precautions/risks over lung fields which include pneumothorax. Reviewed signs and symptoms of pneumothorax and advised pt to go to ER immediately if these symptoms develop advise them of dry needling treatment. Extensive time spent with pt to ensure full understanding of TDN risks. Pt provided verbal consent to treatment. TDN performed to  with 0.25 x 40 single needle placements with local twitch response (LTR). Pistoning technique utilized. Improved pain-free motion following intervention. L upper trap x 2 minutes  ? ?  ?Education provided throughout session via VC/TC and demonstration to facilitate movement at target joints and correct muscle activation for all testing and exercises performed.  ?  ?Patient presents to physical therapy with excellent motivation. He is challenged with dual tasks requiring additional time for task performance. Patient's static stabilization on bosu ball is very challenging and he requires UE support. Coordination sequencing tasks improve with repetition. Pt will continue to benefit from skilled PT intervention in order to improve his strength, balance, mobility and QOL ? ? ? ? ? ? ? ? ? ? ? ? ? ? ? ? ? ? ? ? ? ? ? PT Education - 10/14/21 1024   ? ? Education Details exercise technique, body mechanics   ?  Person(s) Educated Patient   ? Methods Explanation;Demonstration;Tactile cues;Verbal cues   ? Comprehension Verbalized understanding;Returned demonstration;Verbal cues required;Tactile cues required   ? ?  ?  ? ?  ? ? ? PT Short Term Goals - 03/18/21 1306   ? ?  ? PT SHORT TERM GOAL #1  ? Title Pt will be independent with HEP in order to improve strength and balance in order to decrease fall risk and improve function at home and work.   ? Baseline 01/27/2021- Patient presents with no formal HEP in place. 03/18/2021- Patient is knowledgeable of HEP- WIfe reports he is not always compliant but not because I don't know what to do."   ? Time 6   ? Period Weeks   ?  Status Achieved   ? Target Date 03/10/21   ? ?  ?  ? ?  ? ? ? ? PT Long Term Goals - 10/06/21 1442   ? ?  ? PT LONG TERM GOAL #1  ? Title Pt will improve BERG by at least 5 points in order to demonstrate clinically significant improvement in balance.   ? Baseline 01/27/2021= 43/56; 03/18/2021=50/56   ? Time 12   ? Period Weeks   ? Status Achieved   ?  ? PT LONG TERM GOAL #2  ? Title Pt will improve FOTO to target score of 55% to display perceived improvements in ability to complete ADL's.   ? Baseline 01/27/2021= 42%; 04/22/2021- Will assess next visit; 05/06/2021= 64%   ? Time 12   ? Period Weeks   ? Status Achieved   ?  ? PT LONG TERM GOAL #3  ? Title Pt will decrease 5TSTS by at least 3 seconds in order to demonstrate clinically significant improvement in LE strength.   ? Baseline 01/27/2021= 30.07 without UE support; 03/18/2021= 24.78 sec without UE Support; 04/22/2021= 14.47 sec without UE support; 07/15/2021- 17.43 sec without UE support   ? Time 12   ? Period Weeks   ? Status Achieved   ?  ? PT LONG TERM GOAL #4  ? Title Pt will decrease TUG to below 15  seconds/decrease in order to demonstrate decreased fall risk.   ? Baseline 01/27/2021= 20.97 without an AD; 03/18/2021= 19.2 sec without AD; 04/22/2021= 17 sec without AD; 05/13/2021= 14.88 sec. 07/15/2021= 14.7 sec  avg without AD   ? Time 12   ? Period Weeks   ? Status Achieved   ? Target Date 07/15/21   ?  ? PT LONG TERM GOAL #5  ? Title Pt will increase 10MWT by at least 0.13 m/s in order to demonstrate clinically s

## 2021-10-21 ENCOUNTER — Ambulatory Visit: Payer: Medicare Other

## 2021-10-21 DIAGNOSIS — R2689 Other abnormalities of gait and mobility: Secondary | ICD-10-CM | POA: Diagnosis not present

## 2021-10-21 DIAGNOSIS — R2681 Unsteadiness on feet: Secondary | ICD-10-CM

## 2021-10-21 DIAGNOSIS — R296 Repeated falls: Secondary | ICD-10-CM

## 2021-10-21 DIAGNOSIS — R269 Unspecified abnormalities of gait and mobility: Secondary | ICD-10-CM

## 2021-10-21 DIAGNOSIS — R262 Difficulty in walking, not elsewhere classified: Secondary | ICD-10-CM | POA: Diagnosis not present

## 2021-10-21 DIAGNOSIS — R278 Other lack of coordination: Secondary | ICD-10-CM | POA: Diagnosis not present

## 2021-10-21 DIAGNOSIS — M6281 Muscle weakness (generalized): Secondary | ICD-10-CM

## 2021-10-22 ENCOUNTER — Other Ambulatory Visit: Payer: Self-pay | Admitting: Family Medicine

## 2021-10-22 DIAGNOSIS — R413 Other amnesia: Secondary | ICD-10-CM

## 2021-10-22 DIAGNOSIS — I10 Essential (primary) hypertension: Secondary | ICD-10-CM

## 2021-10-22 DIAGNOSIS — E7849 Other hyperlipidemia: Secondary | ICD-10-CM

## 2021-10-22 DIAGNOSIS — Z125 Encounter for screening for malignant neoplasm of prostate: Secondary | ICD-10-CM

## 2021-10-22 DIAGNOSIS — E039 Hypothyroidism, unspecified: Secondary | ICD-10-CM

## 2021-10-22 NOTE — Therapy (Signed)
[place] ?[facility] ?[place], [place], [address] ?Phone: [phone]   Fax:  [phone] ? ?Physical Therapy Treatment ? ?Patient Details  ?Name: Larry [name] ?MRN: [id number] ?Date of Birth: April 18, 1951 ?Referring Provider (PT): Dr. Krista Blue ? ? ?Encounter Date: 10/21/2021 ? ? PT End of Session - 10/21/21 1144   ? ? Visit Number 39   ? Number of Visits 49   ? Date for PT Re-Evaluation 12/29/21   ? Authorization Type [employer]HC Medicare   ? Authorization Time Period 01/27/2021- 27/12/2374; Recert 28/31/5176- 16/01/3709; 07/15/2021-10/07/2021; 10/06/2021- 12/29/2021   ? Progress Note Due on Visit 40   ? PT Start Time 1015   ? PT Stop Time 1056   ? PT Time Calculation (min) 41 min   ? Equipment Utilized During Treatment Gait belt   ? Activity Tolerance Patient tolerated treatment well   ? Behavior During Therapy [facility] for tasks assessed/performed   ? ?  ?  ? ?  ? ? ?Past Medical History:  ?Diagnosis Date  ? ADD (attention deficit disorder)   ? Allergy   ? seasonal flares of perinneal allergies  ? Cancer [facility])   ? melanoma  ? Cardiac arrest [facility]) 07/1997  ? aborted  ? Dementia ([place])   ? post resusitative  ? Depression   ? Diverticulosis   ? Family hx of prostate cancer   ? GERD (gastroesophageal reflux disease)   ? Headache(784.0)   ? Hyperlipidemia   ? Hypothyroidism   ? ICD (implantable cardiac defibrillator) in place   ? Idiopathic urticaria   ? Internal hemorrhoids   ? Low back pain syndrome   ? Melanoma in situ of skin of trunk ([place])   ? chest  ? PVC (premature ventricular contraction)   ? associated with cardiac arrest  ? Sleep apnea   ? no c-pap  ? Tubular adenoma of colon 10/2010  ? ? ?Past Surgical History:  ?Procedure Laterality Date  ?  implantation x2    ? CARDIAC DEFIBRILLATOR PLACEMENT  07/01/2010  ? Explanation of a previously implanted device, pocket revision, and insertion of a new device, and intraoperative defibrillation threshold testing Caryl Comes)  ? CARDIAC  DEFIBRILLATOR REMOVAL  2004  ? Defibrillator change Caryl Comes) 3 times changed  ? CATARACT EXTRACTION W/PHACO Left 02/04/2021  ? Procedure: CATARACT EXTRACTION PHACO AND INTRAOCULAR LENS PLACEMENT ([place]) LEFT VIVITY toric LENS 9.21 01:11.6;  Surgeon: Leandrew Koyanagi, MD;  Location: [place];  Service: Ophthalmology;  Laterality: Left;  keep patient at 11:30 arrival  ? CATARACT EXTRACTION W/PHACO Right 03/04/2021  ? Procedure: CATARACT EXTRACTION PHACO AND INTRAOCULAR LENS PLACEMENT ([place]) RIGHT VIVITY LENS 8.59 01:15.3;  Surgeon: Leandrew Koyanagi, MD;  Location: [place];  Service: Ophthalmology;  Laterality: Right;  ? HEMORRHOID SURGERY    ? ICD    ? TONSILLECTOMY    ? ? ?There were no vitals filed for this visit. ? ? Subjective Assessment - 10/21/21 1104   ? ? Subjective Patient reports his neck felt better after dry needling last visit. He also reported falling at home last Friday-   ? Patient is accompained by: Family member   ? Pertinent History Per note submitted by Dr. Krista Blue on 12/15/2020- The patient has an underlying medical history of cardiac death from ventricular fibrillation with hypoxic brain injury in Jan 1999 due to arrythmia, 15 minutes without heart beat,  status post defibrillator placement, He stayed in hospital for 35 days. On 12/16/2020-Gait abnormality, Also  related to the previous anoxic brain injury, deconditioning,   ? Limitations Walking;House hold activities   ? How long can you sit comfortably? no limitations   ? How long can you stand comfortably? Not really limited   ? How long can you walk comfortably? 15-20   ? Patient Stated Goals To improve transfers, balance and walking.   ? Currently in Pain? Yes   ? Pain Score 5    ? Pain Location Neck   ? Pain Orientation Right;Posterior   ? Pain Descriptors / Indicators Aching;Tightness   ? Pain Type Chronic pain   ? Pain Onset More than a month ago   ? Pain Frequency Intermittent   ? Aggravating Factors  Active cervical  Movement   ? Pain Relieving Factors Rest   ? Effect of Pain on Daily Activities Difficulty with driving and any activities that required turning his head   ? ?  ?  ? ?  ? ? ?INTERVENTIONS:  ? ?Manual therapy:  ? ? ?Supine cervical PROM- Flex/ext/L and R SB/ L and R Rotation- Increased time spent with SB and rotation. Patient presented with increased stiffness throughout cervical motions but did improve with stretching.  ? ?PA vertebral mobs- in prone (C4-T7 region) grade 2-3.  ? ?STM to posterior cervical region in supine and along right levator/scalenes/Upper trap. ? ?AROM- cervical flex/ext/Rotation/Side bending ? ?Measured post treatment:  ?Cervical Rotation= L-30/R-40 ?Cervical Sidebending= L-18/R-28 deg ? ? ? ? ? ? ? ? ? ? ? ? ? ? ? ? ? ? ? ? ? ? ? PT Education - 10/21/21 1144   ? ? Education Details Exercise technique   ? Person(s) Educated Patient   ? Methods Explanation;Demonstration;Tactile cues;Verbal cues   ? Comprehension Returned demonstration;Verbalized understanding;Verbal cues required;Tactile cues required;Need further instruction   ? ?  ?  ? ?  ? ? ? PT Short Term Goals - 03/18/21 1306   ? ?  ? PT SHORT TERM GOAL #1  ? Title Pt will be independent with HEP in order to improve strength and balance in order to decrease fall risk and improve function at home and work.   ? Baseline 01/27/2021- Patient presents with no formal HEP in place. 03/18/2021- Patient is knowledgeable of HEP- WIfe reports he is not always compliant but not because I don't know what to do."   ? Time 6   ? Period Weeks   ? Status Achieved   ? Target Date 03/10/21   ? ?  ?  ? ?  ? ? ? ? PT Long Term Goals - 10/06/21 1442   ? ?  ? PT LONG TERM GOAL #1  ? Title Pt will improve BERG by at least 5 points in order to demonstrate clinically significant improvement in balance.   ? Baseline 01/27/2021= 43/56; 03/18/2021=50/56   ? Time 12   ? Period Weeks   ? Status Achieved   ?  ? PT LONG TERM GOAL #2  ? Title Pt will improve FOTO to target  score of 55% to display perceived improvements in ability to complete ADL's.   ? Baseline 01/27/2021= 42%; 04/22/2021- Will assess next visit; 05/06/2021= 64%   ? Time 12   ? Period Weeks   ? Status Achieved   ?  ? PT LONG TERM GOAL #3  ? Title Pt will decrease 5TSTS by at least 3 seconds in order to demonstrate clinically significant improvement in LE strength.   ? Baseline 01/27/2021= 30.07 without UE  support; 03/18/2021= 24.78 sec without UE Support; 04/22/2021= 14.47 sec without UE support; 07/15/2021- 17.43 sec without UE support   ? Time 12   ? Period Weeks   ? Status Achieved   ?  ? PT LONG TERM GOAL #4  ? Title Pt will decrease TUG to below 15  seconds/decrease in order to demonstrate decreased fall risk.   ? Baseline 01/27/2021= 20.97 without an AD; 03/18/2021= 19.2 sec without AD; 04/22/2021= 17 sec without AD; 05/13/2021= 14.88 sec. 07/15/2021= 14.7 sec avg without AD   ? Time 12   ? Period Weeks   ? Status Achieved   ? Target Date 07/15/21   ?  ? PT LONG TERM GOAL #5  ? Title Pt will increase 10MWT by at least 0.13 m/s in order to demonstrate clinically significant improvement in community ambulation.   ? Baseline 01/27/2021= 0.67 m/s without an AD.  9/14= 0.75 m/s; 04/22/2021= 0.82 sec without an AD- WIll keep goal active to ensure patient can perform consistently. 05/13/2021= 0.89 m/s. 07/15/2021= 0.98 m/s   ? Time 12   ? Period Weeks   ? Status Achieved   ? Target Date 07/15/21   ?  ? Additional Long Term Goals  ? Additional Long Term Goals Yes   ?  ? PT LONG TERM GOAL #6  ? Title Pt will increase 6MWT by at least 32m(1650f in order to demonstrate clinically significant improvement in cardiopulmonary endurance and community ambulation   ? Baseline 02/03/2021= 1100 feet; 03/18/21= 1125 feet without AD.  04/22/2021= 1175 feet without AD 07/15/2021= 1210 feet without an AD; 08/05/2021= 1190 feet without AD; 10/06/2021= 1250 feet   ? Time 11   ? Period Weeks   ? Status On-going   ? Target Date 12/29/21   ?  ? PT LONG TERM  GOAL #7  ? Title Pt will increase 10MWT to 1.0 m/s or greater in order to demonstrate clinically significant improvement in community ambulation.   ? Baseline 07/15/2021= 0.98 m/s; 10/06/2021= 1.1 m/s   ? Tim

## 2021-10-26 NOTE — Progress Notes (Signed)
Remote ICD transmission.   

## 2021-10-27 ENCOUNTER — Other Ambulatory Visit (INDEPENDENT_AMBULATORY_CARE_PROVIDER_SITE_OTHER): Payer: Medicare Other

## 2021-10-27 DIAGNOSIS — E7849 Other hyperlipidemia: Secondary | ICD-10-CM

## 2021-10-27 DIAGNOSIS — Z125 Encounter for screening for malignant neoplasm of prostate: Secondary | ICD-10-CM | POA: Diagnosis not present

## 2021-10-27 DIAGNOSIS — E039 Hypothyroidism, unspecified: Secondary | ICD-10-CM

## 2021-10-27 DIAGNOSIS — R413 Other amnesia: Secondary | ICD-10-CM

## 2021-10-27 DIAGNOSIS — I10 Essential (primary) hypertension: Secondary | ICD-10-CM

## 2021-10-27 LAB — CBC WITH DIFFERENTIAL/PLATELET
Basophils Absolute: 0.1 10*3/uL (ref 0.0–0.1)
Basophils Relative: 0.9 % (ref 0.0–3.0)
Eosinophils Absolute: 0.2 10*3/uL (ref 0.0–0.7)
Eosinophils Relative: 2.9 % (ref 0.0–5.0)
HCT: 42 % (ref 39.0–52.0)
Hemoglobin: 14.1 g/dL (ref 13.0–17.0)
Lymphocytes Relative: 40.2 % (ref 12.0–46.0)
Lymphs Abs: 3 10*3/uL (ref 0.7–4.0)
MCHC: 33.6 g/dL (ref 30.0–36.0)
MCV: 93 fl (ref 78.0–100.0)
Monocytes Absolute: 0.7 10*3/uL (ref 0.1–1.0)
Monocytes Relative: 9.6 % (ref 3.0–12.0)
Neutro Abs: 3.5 10*3/uL (ref 1.4–7.7)
Neutrophils Relative %: 46.4 % (ref 43.0–77.0)
Platelets: 348 10*3/uL (ref 150.0–400.0)
RBC: 4.52 Mil/uL (ref 4.22–5.81)
RDW: 13 % (ref 11.5–15.5)
WBC: 7.5 10*3/uL (ref 4.0–10.5)

## 2021-10-27 LAB — TSH: TSH: 1.66 u[IU]/mL (ref 0.35–5.50)

## 2021-10-27 LAB — LIPID PANEL
Cholesterol: 186 mg/dL (ref 0–200)
HDL: 59 mg/dL (ref >39.00)
LDL Cholesterol: 106 mg/dL — ABNORMAL HIGH (ref 0–99)
NonHDL: 126.56
Total CHOL/HDL Ratio: 3
Triglycerides: 101 mg/dL (ref 0.0–149.0)
VLDL: 20.2 mg/dL (ref 0.0–40.0)

## 2021-10-27 LAB — COMPREHENSIVE METABOLIC PANEL
ALT: 30 U/L (ref 0–53)
AST: 24 U/L (ref 0–37)
Albumin: 4.5 g/dL (ref 3.5–5.2)
Alkaline Phosphatase: 80 U/L (ref 39–117)
BUN: 12 mg/dL (ref 6–23)
CO2: 30 mEq/L (ref 19–32)
Calcium: 9.5 mg/dL (ref 8.4–10.5)
Chloride: 101 mEq/L (ref 96–112)
Creatinine, Ser: 0.93 mg/dL (ref 0.40–1.50)
GFR: 83.91 mL/min (ref >60.00)
Glucose, Bld: 94 mg/dL (ref 70–99)
Potassium: 4.2 mEq/L (ref 3.5–5.1)
Sodium: 137 mEq/L (ref 135–145)
Total Bilirubin: 0.5 mg/dL (ref 0.2–1.2)
Total Protein: 6.9 g/dL (ref 6.0–8.3)

## 2021-10-27 LAB — HEMOGLOBIN A1C: Hgb A1c MFr Bld: 6.2 % (ref 4.6–6.5)

## 2021-10-27 LAB — PSA, MEDICARE: PSA: 0.2 ng/ml (ref 0.10–4.00)

## 2021-10-28 ENCOUNTER — Ambulatory Visit: Payer: Medicare Other

## 2021-10-28 DIAGNOSIS — R2689 Other abnormalities of gait and mobility: Secondary | ICD-10-CM | POA: Diagnosis not present

## 2021-10-28 DIAGNOSIS — R296 Repeated falls: Secondary | ICD-10-CM

## 2021-10-28 DIAGNOSIS — R278 Other lack of coordination: Secondary | ICD-10-CM

## 2021-10-28 DIAGNOSIS — R262 Difficulty in walking, not elsewhere classified: Secondary | ICD-10-CM

## 2021-10-28 DIAGNOSIS — R269 Unspecified abnormalities of gait and mobility: Secondary | ICD-10-CM

## 2021-10-28 DIAGNOSIS — R2681 Unsteadiness on feet: Secondary | ICD-10-CM | POA: Diagnosis not present

## 2021-10-28 DIAGNOSIS — M6281 Muscle weakness (generalized): Secondary | ICD-10-CM

## 2021-10-28 NOTE — Therapy (Signed)
[facility] ?[place], [place], [address] ?Phone: [phone]   Fax:  [phone] ? ?Physical Therapy Treatment/Physical Therapy Progress Note ? ? ?Dates of reporting period  08/05/2021  to   10/28/2021 ? ?Patient Details  ?Name: Larry [name] ?MRN: [id number] ?Date of Birth: [birth year] ?Referring Provider (PT): Dr. Krista Blue ? ? ?Encounter Date: 10/28/2021 ? ? PT End of Session - 10/28/21 1443   ? ? Visit Number 40   ? Number of Visits 49   ? Date for PT Re-Evaluation 12/29/21   ? Authorization Type U[employer] Medicare   ? Authorization Time Period 01/27/2021- 98/92/1194; Recert 17/40/8144- 81/85/6314; 07/15/2021-10/07/2021; 10/06/2021- 12/29/2021   ? Progress Note Due on Visit 40   ? PT Start Time 1434   ? PT Stop Time 9702   ? PT Time Calculation (min) 40 min   ? Equipment Utilized During Treatment Gait belt   ? Activity Tolerance Patient tolerated treatment well   ? Behavior During Therapy [facility] for tasks assessed/performed   ? ?  ?  ? ?  ? ? ?Past Medical History:  ?Diagnosis Date  ? ADD (attention deficit disorder)   ? Allergy   ? seasonal flares of perinneal allergies  ? Cancer [facility])   ? melanoma  ? Cardiac arrest [facility]) 07/1997  ? aborted  ? Dementia ([place])   ? post resusitative  ? Depression   ? Diverticulosis   ? Family hx of prostate cancer   ? GERD (gastroesophageal reflux disease)   ? Headache(784.0)   ? Hyperlipidemia   ? Hypothyroidism   ? ICD (implantable cardiac defibrillator) in place   ? Idiopathic urticaria   ? Internal hemorrhoids   ? Low back pain syndrome   ? Melanoma in situ of skin of trunk ([place])   ? chest  ? PVC (premature ventricular contraction)   ? associated with cardiac arrest  ? Sleep apnea   ? no c-pap  ? Tubular adenoma of colon 10/2010  ? ? ?Past Surgical History:  ?Procedure Laterality Date  ?  implantation x2    ? CARDIAC DEFIBRILLATOR PLACEMENT  07/01/2010  ? Explanation of a previously implanted device, pocket revision, and insertion  of a new device, and intraoperative defibrillation threshold testing Caryl Comes)  ? CARDIAC DEFIBRILLATOR REMOVAL  2004  ? Defibrillator change Caryl Comes) 3 times changed  ? CATARACT EXTRACTION W/PHACO Left 02/04/2021  ? Procedure: CATARACT EXTRACTION PHACO AND INTRAOCULAR LENS PLACEMENT ([place]) LEFT VIVITY toric LENS 9.21 01:11.6;  Surgeon: Leandrew Koyanagi, MD;  Location: [place];  Service: Ophthalmology;  Laterality: Left;  keep patient at 11:30 arrival  ? CATARACT EXTRACTION W/PHACO Right 03/04/2021  ? Procedure: CATARACT EXTRACTION PHACO AND INTRAOCULAR LENS PLACEMENT ([place]) RIGHT VIVITY LENS 8.59 01:15.3;  Surgeon: Leandrew Koyanagi, MD;  Location: [place];  Service: Ophthalmology;  Laterality: Right;  ? HEMORRHOID SURGERY    ? ICD    ? TONSILLECTOMY    ? ? ?There were no vitals filed for this visit. ? ? Subjective Assessment - 10/28/21 1438   ? ? Subjective Patient reports feeling okay today with no new issues. Patient reports out working in the yard today.   ? Patient is accompained by: Family member   ? Pertinent History Per note submitted by Dr. Krista Blue on 12/15/2020- The patient has an underlying medical history of cardiac death from ventricular fibrillation with hypoxic brain injury in Jan 1999 due to arrythmia, 15 minutes without heart beat,  status  post defibrillator placement, He stayed in hospital for 35 days. On 12/16/2020-Gait abnormality, Also related to the previous anoxic brain injury, deconditioning,   ? Limitations Walking;House hold activities   ? How long can you sit comfortably? no limitations   ? How long can you stand comfortably? Not really limited   ? How long can you walk comfortably? 15-20   ? Patient Stated Goals To improve transfers, balance and walking.   ? Currently in Pain? No/denies   ? Pain Onset More than a month ago   ? ?  ?  ? ?  ? ? ? ? ? ? ? ? ?INTERVENTIONS:  ? ?Neuromuscular re-ed: ? ?Dynamic marching standing on airex pad x 25 reps each LE ? ?Dual task-  standing on airex pad and using dry erase board- placing magnetic letters and placing them in alphbetical order- Patient with some issues locating letters- mostly using ankle strategy with no specific LOB.  ?18  min to complete task. ? ?Dual task activity- jumbled all letters and numbers into center of dry erase board- Patient then performed tandem standing while placing letter/numbers into 5 groups (separated by color) using left and right UE.  ?Patient would perform for several min then switch feet- total time to complete= 15 min ? ?Cervical ROM: Rotation= L/R= 25/30 ? ? ?   ? ? ? ? ? ? ? ? ? ? ? ? ? ? ? ? ? ? ? ? PT Education - 10/28/21 1442   ? ? Education Details Balance exercise technique   ? Person(s) Educated Patient   ? Methods Explanation;Demonstration;Tactile cues;Verbal cues   ? Comprehension Verbalized understanding;Returned demonstration;Verbal cues required;Tactile cues required;Need further instruction   ? ?  ?  ? ?  ? ? ? PT Short Term Goals - 03/18/21 1306   ? ?  ? PT SHORT TERM GOAL #1  ? Title Pt will be independent with HEP in order to improve strength and balance in order to decrease fall risk and improve function at home and work.   ? Baseline 01/27/2021- Patient presents with no formal HEP in place. 03/18/2021- Patient is knowledgeable of HEP- WIfe reports he is not always compliant but not because I don't know what to do."   ? Time 6   ? Period Weeks   ? Status Achieved   ? Target Date 03/10/21   ? ?  ?  ? ?  ? ? ? ? PT Long Term Goals - 10/28/21 1513   ? ?  ? PT LONG TERM GOAL #1  ? Title Pt will improve BERG by at least 5 points in order to demonstrate clinically significant improvement in balance.   ? Baseline 01/27/2021= 43/56; 03/18/2021=50/56   ? Time 12   ? Period Weeks   ? Status Achieved   ?  ? PT LONG TERM GOAL #2  ? Title Pt will improve FOTO to target score of 55% to display perceived improvements in ability to complete ADL's.   ? Baseline 01/27/2021= 42%; 04/22/2021- Will assess next  visit; 05/06/2021= 64%   ? Time 12   ? Period Weeks   ? Status Achieved   ?  ? PT LONG TERM GOAL #3  ? Title Pt will decrease 5TSTS by at least 3 seconds in order to demonstrate clinically significant improvement in LE strength.   ? Baseline 01/27/2021= 30.07 without UE support; 03/18/2021= 24.78 sec without UE Support; 04/22/2021= 14.47 sec without UE support; 07/15/2021- 17.43 sec without UE support   ?  Time 12   ? Period Weeks   ? Status Achieved   ?  ? PT LONG TERM GOAL #4  ? Title Pt will decrease TUG to below 15  seconds/decrease in order to demonstrate decreased fall risk.   ? Baseline 01/27/2021= 20.97 without an AD; 03/18/2021= 19.2 sec without AD; 04/22/2021= 17 sec without AD; 05/13/2021= 14.88 sec. 07/15/2021= 14.7 sec avg without AD   ? Time 12   ? Period Weeks   ? Status Achieved   ? Target Date 07/15/21   ?  ? PT LONG TERM GOAL #5  ? Title Pt will increase 10MWT by at least 0.13 m/s in order to demonstrate clinically significant improvement in community ambulation.   ? Baseline 01/27/2021= 0.67 m/s without an AD.  9/14= 0.75 m/s; 04/22/2021= 0.82 sec without an AD- WIll keep goal active to ensure patient can perform consistently. 05/13/2021= 0.89 m/s. 07/15/2021= 0.98 m/s   ? Time 12   ? Period Weeks   ? Status Achieved   ? Target Date 07/15/21   ?  ? PT LONG TERM GOAL #6  ? Title Pt will increase 6MWT by at least 100m(165f in order to demonstrate clinically significant improvement in cardiopulmonary endurance and community ambulation   ? Baseline 02/03/2021= 1100 feet; 03/18/21= 1125 feet without AD.  04/22/2021= 1175 feet without AD 07/15/2021= 1210 feet without an AD; 08/05/2021= 1190 feet without AD; 10/06/2021= 1250 feet   ? Time 11   ? Period Weeks   ? Status On-going   ? Target Date 12/29/21   ?  ? PT LONG TERM GOAL #7  ? Title Pt will increase 10MWT to 1.0 m/s or greater in order to demonstrate clinically significant improvement in community ambulation.   ? Baseline 07/15/2021= 0.98 m/s; 10/06/2021= 1.1 m/s   ?  Time 12   ? Period Weeks   ? Status Achieved   ? Target Date 10/07/21   ?  ? PT LONG TERM GOAL #8  ? Title Patient will demonstrate improved flexibility in Cervical Rot and hamstring flexibility for impro

## 2021-11-02 DIAGNOSIS — H90A22 Sensorineural hearing loss, unilateral, left ear, with restricted hearing on the contralateral side: Secondary | ICD-10-CM | POA: Diagnosis not present

## 2021-11-02 DIAGNOSIS — H903 Sensorineural hearing loss, bilateral: Secondary | ICD-10-CM | POA: Diagnosis not present

## 2021-11-04 ENCOUNTER — Ambulatory Visit: Payer: Medicare Other | Attending: Neurology

## 2021-11-04 DIAGNOSIS — M6281 Muscle weakness (generalized): Secondary | ICD-10-CM | POA: Insufficient documentation

## 2021-11-04 DIAGNOSIS — R2681 Unsteadiness on feet: Secondary | ICD-10-CM | POA: Insufficient documentation

## 2021-11-04 DIAGNOSIS — R262 Difficulty in walking, not elsewhere classified: Secondary | ICD-10-CM | POA: Diagnosis not present

## 2021-11-04 DIAGNOSIS — R269 Unspecified abnormalities of gait and mobility: Secondary | ICD-10-CM | POA: Diagnosis not present

## 2021-11-04 DIAGNOSIS — R278 Other lack of coordination: Secondary | ICD-10-CM | POA: Insufficient documentation

## 2021-11-04 DIAGNOSIS — M542 Cervicalgia: Secondary | ICD-10-CM | POA: Insufficient documentation

## 2021-11-04 DIAGNOSIS — R2689 Other abnormalities of gait and mobility: Secondary | ICD-10-CM | POA: Insufficient documentation

## 2021-11-04 DIAGNOSIS — R296 Repeated falls: Secondary | ICD-10-CM | POA: Insufficient documentation

## 2021-11-04 NOTE — Therapy (Signed)
[facility] ?[place], [place], [address] ?Phone: [phone]   Fax:  [phone] ? ?Physical Therapy Treatment ? ?Patient Details  ?Name: Larry [name] ?MRN: [id number] ?Date of Birth: 07/31/1950 ?Referring Provider (PT): Dr. Krista Blue ? ? ?Encounter Date: 11/04/2021 ? ? PT End of Session - 11/04/21 2774   ? ? Visit Number 41   ? Number of Visits 49   ? Date for PT Re-Evaluation 12/29/21   ? Authorization Type UHC Medicare   ? Authorization Time Period 01/27/2021- 12/87/8676; Recert 72/03/4708- 62/83/6629; 07/15/2021-10/07/2021; 10/06/2021- 12/29/2021   ? Progress Note Due on Visit 40   ? PT Start Time [phone]   ? PT Stop Time 4650   ? PT Time Calculation (min) 40 min   ? Equipment Utilized During Treatment Gait belt   ? Activity Tolerance Patient tolerated treatment well   ? Behavior During Therapy [facility] for tasks assessed/performed   ? ?  ?  ? ?  ? ? ?Past Medical History:  ?Diagnosis Date  ? ADD (attention deficit disorder)   ? Allergy   ? seasonal flares of perinneal allergies  ? Cancer [facility])   ? melanoma  ? Cardiac arrest [facility]) 07/1997  ? aborted  ? Dementia ([place])   ? post resusitative  ? Depression   ? Diverticulosis   ? Family hx of prostate cancer   ? GERD (gastroesophageal reflux disease)   ? Headache(784.0)   ? Hyperlipidemia   ? Hypothyroidism   ? ICD (implantable cardiac defibrillator) in place   ? Idiopathic urticaria   ? Internal hemorrhoids   ? Low back pain syndrome   ? Melanoma in situ of skin of trunk ([place])   ? chest  ? PVC (premature ventricular contraction)   ? associated with cardiac arrest  ? Sleep apnea   ? no c-pap  ? Tubular adenoma of colon 10/2010  ? ? ?Past Surgical History:  ?Procedure Laterality Date  ?  implantation x2    ? CARDIAC DEFIBRILLATOR PLACEMENT  07/01/2010  ? Explanation of a previously implanted device, pocket revision, and insertion of a new device, and intraoperative defibrillation threshold testing Caryl Comes)  ? CARDIAC  DEFIBRILLATOR REMOVAL  2004  ? Defibrillator change Caryl Comes) 3 times changed  ? CATARACT EXTRACTION W/PHACO Left 02/04/2021  ? Procedure: CATARACT EXTRACTION PHACO AND INTRAOCULAR LENS PLACEMENT ([place]) LEFT VIVITY toric LENS 9.21 01:11.6;  Surgeon: Leandrew Koyanagi, MD;  Location: [place];  Service: Ophthalmology;  Laterality: Left;  keep patient at 11:30 arrival  ? CATARACT EXTRACTION W/PHACO Right 03/04/2021  ? Procedure: CATARACT EXTRACTION PHACO AND INTRAOCULAR LENS PLACEMENT ([place]) RIGHT VIVITY LENS 8.59 01:15.3;  Surgeon: Leandrew Koyanagi, MD;  Location: [facility];  Service: Ophthalmology;  Laterality: Right;  ? HEMORRHOID SURGERY    ? ICD    ? TONSILLECTOMY    ? ? ?There were no vitals filed for this visit. ? ? Subjective Assessment - 11/04/21 0850   ? ? Subjective Patient reports no new issues and states feeling well today.   ? Patient is accompained by: Family member   ? Pertinent History Per note submitted by Dr. Krista Blue on 12/15/2020- The patient has an underlying medical history of cardiac death from ventricular fibrillation with hypoxic brain injury in Jan 1999 due to arrythmia, 15 minutes without heart beat,  status post defibrillator placement, He stayed in hospital for 35 days. On 12/16/2020-Gait abnormality, Also related to the previous anoxic brain injury, deconditioning,   ?  Limitations Walking;House hold activities   ? How long can you sit comfortably? no limitations   ? How long can you stand comfortably? Not really limited   ? How long can you walk comfortably? 15-20   ? Patient Stated Goals To improve transfers, balance and walking.   ? Currently in Pain? No/denies   ? Pain Onset More than a month ago   ? ?  ?  ? ?  ? ? ? ? ?INTERVENTIONS:  ? ?Therapeutic exercises:  ? ?INTERVAL Nustep BUE/LE at varying intensity for cardiovasular and musculoskeletal strength. ?1 min= L3 ?1 min= L1 ?1 min= L4 ?1 min= L1 ?1 min=L5 ?1 min= L1 ?Total time= 6 min and 0.25 mi ?RPE= 7/10 ? ?Review  of active cervical ROM: Flex/ext/ Rotation/SB x 20 reps each- Patient presents ongoing right sided tightness and requires VC for reminders of all positions/motions today.  ? ?Postural strengthening: in standing  ?-snow angel against wall 2 sets x 12 reps BUE ?- Standing facing wall- Standing "Y" 2 sets of 12 reps ?-scap retraction using GTB 2 sets of 12 reps ?- Shoulder Ext using GTB 2 sets of 12 reps ?- Posterior shoulder rolls using GTB 2 sets of 12 reps ? ?Neuro re-ed in //bars ?- dynamic walk with high knees focusing on coordination of activity- down and back in // bars x 6 ?-Side step over 4 positioned hedgehogs  in // bars - initially slow for control with instruction to gradually pick up speed- improving each pass by 2-3 sec. Patient with more difficulty with faster speeds. ? ?Education provided throughout session via VC/TC and demonstration to facilitate movement at target joints and correct muscle activation for all testing and exercises performed.  ? ? ? ? ? ? ? ? ? ? ? ? ? ? ? ? ? ? ? ? ? ? PT Education - 11/04/21 0852   ? ? Education Details Exercise technique   ? Person(s) Educated Patient   ? Methods Explanation;Demonstration;Tactile cues;Verbal cues   ? Comprehension Verbalized understanding;Returned demonstration;Verbal cues required;Tactile cues required;Need further instruction   ? ?  ?  ? ?  ? ? ? PT Short Term Goals - 03/18/21 1306   ? ?  ? PT SHORT TERM GOAL #1  ? Title Pt will be independent with HEP in order to improve strength and balance in order to decrease fall risk and improve function at home and work.   ? Baseline 01/27/2021- Patient presents with no formal HEP in place. 03/18/2021- Patient is knowledgeable of HEP- WIfe reports he is not always compliant but not because I don't know what to do."   ? Time 6   ? Period Weeks   ? Status Achieved   ? Target Date 03/10/21   ? ?  ?  ? ?  ? ? ? ? PT Long Term Goals - 10/28/21 1513   ? ?  ? PT LONG TERM GOAL #1  ? Title Pt will improve BERG by at  least 5 points in order to demonstrate clinically significant improvement in balance.   ? Baseline 01/27/2021= 43/56; 03/18/2021=50/56   ? Time 12   ? Period Weeks   ? Status Achieved   ?  ? PT LONG TERM GOAL #2  ? Title Pt will improve FOTO to target score of 55% to display perceived improvements in ability to complete ADL's.   ? Baseline 01/27/2021= 42%; 04/22/2021- Will assess next visit; 05/06/2021= 64%   ? Time 12   ?  Period Weeks   ? Status Achieved   ?  ? PT LONG TERM GOAL #3  ? Title Pt will decrease 5TSTS by at least 3 seconds in order to demonstrate clinically significant improvement in LE strength.   ? Baseline 01/27/2021= 30.07 without UE support; 03/18/2021= 24.78 sec without UE Support; 04/22/2021= 14.47 sec without UE support; 07/15/2021- 17.43 sec without UE support   ? Time 12   ? Period Weeks   ? Status Achieved   ?  ? PT LONG TERM GOAL #4  ? Title Pt will decrease TUG to below 15  seconds/decrease in order to demonstrate decreased fall risk.   ? Baseline 01/27/2021= 20.97 without an AD; 03/18/2021= 19.2 sec without AD; 04/22/2021= 17 sec without AD; 05/13/2021= 14.88 sec. 07/15/2021= 14.7 sec avg without AD   ? Time 12   ? Period Weeks   ? Status Achieved   ? Target Date 07/15/21   ?  ? PT LONG TERM GOAL #5  ? Title Pt will increase 10MWT by at least 0.13 m/s in order to demonstrate clinically significant improvement in community ambulation.   ? Baseline 01/27/2021= 0.67 m/s without an AD.  9/14= 0.75 m/s; 04/22/2021= 0.82 sec without an AD- WIll keep goal active to ensure patient can perform consistently. 05/13/2021= 0.89 m/s. 07/15/2021= 0.98 m/s   ? Time 12   ? Period Weeks   ? Status Achieved   ? Target Date 07/15/21   ?  ? PT LONG TERM GOAL #6  ? Title Pt will increase 6MWT by at least 17m(1639f in order to demonstrate clinically significant improvement in cardiopulmonary endurance and community ambulation   ? Baseline 02/03/2021= 1100 feet; 03/18/21= 1125 feet without AD.  04/22/2021= 1175 feet without AD  07/15/2021= 1210 feet without an AD; 08/05/2021= 1190 feet without AD; 10/06/2021= 1250 feet   ? Time 11   ? Period Weeks   ? Status On-going   ? Target Date 12/29/21   ?  ? PT LONG TERM GOAL #7  ? Title P

## 2021-11-05 ENCOUNTER — Ambulatory Visit (INDEPENDENT_AMBULATORY_CARE_PROVIDER_SITE_OTHER): Payer: Medicare Other | Admitting: Family Medicine

## 2021-11-05 ENCOUNTER — Encounter: Payer: Self-pay | Admitting: Family Medicine

## 2021-11-05 VITALS — BP 122/80 | HR 97 | Temp 97.6°F | Ht 69.0 in | Wt 180.0 lb

## 2021-11-05 DIAGNOSIS — R413 Other amnesia: Secondary | ICD-10-CM | POA: Diagnosis not present

## 2021-11-05 DIAGNOSIS — Z1211 Encounter for screening for malignant neoplasm of colon: Secondary | ICD-10-CM

## 2021-11-05 DIAGNOSIS — T7840XS Allergy, unspecified, sequela: Secondary | ICD-10-CM | POA: Diagnosis not present

## 2021-11-05 DIAGNOSIS — E7849 Other hyperlipidemia: Secondary | ICD-10-CM

## 2021-11-05 DIAGNOSIS — E039 Hypothyroidism, unspecified: Secondary | ICD-10-CM | POA: Diagnosis not present

## 2021-11-05 DIAGNOSIS — I10 Essential (primary) hypertension: Secondary | ICD-10-CM

## 2021-11-05 DIAGNOSIS — Z0000 Encounter for general adult medical examination without abnormal findings: Secondary | ICD-10-CM

## 2021-11-05 DIAGNOSIS — Z136 Encounter for screening for cardiovascular disorders: Secondary | ICD-10-CM

## 2021-11-05 DIAGNOSIS — Z7189 Other specified counseling: Secondary | ICD-10-CM

## 2021-11-05 MED ORDER — LEVOTHYROXINE SODIUM 137 MCG PO TABS: 137.0000 ug | ORAL_TABLET | Freq: Every day | ORAL | 3 refills | Status: DC

## 2021-11-05 MED ORDER — MONTELUKAST SODIUM 10 MG PO TABS: 10.0000 mg | ORAL_TABLET | Freq: Every day | ORAL | 3 refills | Status: DC

## 2021-11-05 MED ORDER — ROSUVASTATIN CALCIUM 20 MG PO TABS: 20.0000 mg | ORAL_TABLET | Freq: Every day | ORAL | 3 refills | Status: DC

## 2021-11-05 NOTE — Patient Instructions (Addendum)
Check with your insurance to see if they will cover the tetanus shot. ?We'll call about seeing GI and getting the AAA screening done.   ?Take care.  Glad to see you. ?

## 2021-11-05 NOTE — Progress Notes (Signed)
Memory d/w pt.  Still on namenda and donepezil.  Slowly progressive.  It is harder for patient to recall specific items in a sequence, ie short term recall with doing a task at home.  Using lists to compensate.  Still on adderall, he can tell a difference when off med.  No ADE on med.  He is still in PT per neuro.   ? ?Elevated Cholesterol: ?Using medications without problems: yes ?Muscle aches: no ?Diet compliance: d/w pt.   ?Exercise: d/w pt.  ? ?Singular use d/w pt.  It helped.  Compliant.  Using a mask with mowing the lawn.   ? ?Hypothyroidism.  Complaint.  TSH wnl.  No ADE on med.  No dysphagia.   ? ?Hypertension:    ?Using medication without problems or lightheadedness: yes ?Chest pain with exertion:no ?Edema:no ?Short of breath:no ?He has occ BP elevation at home. D/w pt about checking his cuff at home.   ? ?He is still seeing dermatology at baseline.   ? ?Flu 2022 ?PNA up to date.   ?Tetanus 2010, d/w pt.   ?shingrix d/w pt.  ?covid vaccine prev done.   ?PSA wnl, d/w pt.   ?Colonoscopy 2017, referred 2023 ?Advance directive d/w pt. wife designated if patient were incapacitated ?AAA screen ordered 2023 ? ?Meds, vitals, and allergies reviewed.  ? ?ROS: Per HPI unless specifically indicated in ROS section  ? ?GEN: nad, alert and oriented ?HEENT: ncat ?NECK: supple w/o LA ?CV: rrr ?PULM: ctab, no inc wob ?ABD: soft, +bs ?EXT: no edema ?SKIN: no acute rash ?

## 2021-11-08 ENCOUNTER — Other Ambulatory Visit: Payer: Self-pay | Admitting: Neurology

## 2021-11-08 DIAGNOSIS — R413 Other amnesia: Secondary | ICD-10-CM

## 2021-11-08 NOTE — Assessment & Plan Note (Signed)
Slowly progressive, over the years.  Continue Namenda and donepezil.  He is using Adderall for attention without adverse effect on medication.  Continue using list, etc.  He will update me as needed.  No red flag events.  Still okay for outpatient follow-up. ?

## 2021-11-08 NOTE — Assessment & Plan Note (Signed)
Advance directive d/w pt. wife designated if patient were incapacitated ?

## 2021-11-08 NOTE — Assessment & Plan Note (Signed)
Continue Crestor.  Continue work on diet and exercise. 

## 2021-11-08 NOTE — Assessment & Plan Note (Signed)
History of seasonal allergies.  Continue Singulair. ?

## 2021-11-08 NOTE — Assessment & Plan Note (Signed)
Complaint.  TSH wnl.  No ADE on med.  No dysphagia.  Continue levothyroxine at baseline. ?

## 2021-11-08 NOTE — Assessment & Plan Note (Signed)
Flu 2022 ?PNA up to date.   ?Tetanus 2010, d/w pt.   ?shingrix d/w pt.  ?covid vaccine prev done.   ?PSA wnl, d/w pt.   ?Colonoscopy 2017, referred 2023 ?Advance directive d/w pt. wife designated if patient were incapacitated ?AAA screen ordered 2023 ?

## 2021-11-08 NOTE — Assessment & Plan Note (Signed)
He has occ BP elevation at home. D/w pt about checking his cuff at home.  He can update me as needed.  Continue diltiazem and metoprolol. ?

## 2021-11-09 ENCOUNTER — Ambulatory Visit: Payer: Medicare Other

## 2021-11-09 DIAGNOSIS — R262 Difficulty in walking, not elsewhere classified: Secondary | ICD-10-CM | POA: Diagnosis not present

## 2021-11-09 DIAGNOSIS — R2689 Other abnormalities of gait and mobility: Secondary | ICD-10-CM | POA: Diagnosis not present

## 2021-11-09 DIAGNOSIS — R296 Repeated falls: Secondary | ICD-10-CM | POA: Diagnosis not present

## 2021-11-09 DIAGNOSIS — R269 Unspecified abnormalities of gait and mobility: Secondary | ICD-10-CM | POA: Diagnosis not present

## 2021-11-09 DIAGNOSIS — R2681 Unsteadiness on feet: Secondary | ICD-10-CM | POA: Diagnosis not present

## 2021-11-09 DIAGNOSIS — M6281 Muscle weakness (generalized): Secondary | ICD-10-CM | POA: Diagnosis not present

## 2021-11-09 DIAGNOSIS — M542 Cervicalgia: Secondary | ICD-10-CM | POA: Diagnosis not present

## 2021-11-09 DIAGNOSIS — R278 Other lack of coordination: Secondary | ICD-10-CM | POA: Diagnosis not present

## 2021-11-09 NOTE — Therapy (Signed)
[place] [facility] ?[place], [place], [address] ?Phone: [phone]   Fax:  [phone] ? ?Physical Therapy Treatment ? ?Patient Details  ?Name: Larry [name] ?MRN: [id number] ?Date of Birth: 02-15-1951 ?Referring Provider (PT): Dr. Krista Blue ? ? ?Encounter Date: 11/09/2021 ? ? PT End of Session - 11/09/21 1252   ? ? Visit Number 42   ? Number of Visits 49   ? Date for PT Re-Evaluation 12/29/21   ? Authorization Type UHC Medicare   ? Authorization Time Period 01/27/2021- 85/27/7824; Recert 23/53/6144- 31/54/0086; 07/15/2021-10/07/2021; 10/06/2021- 12/29/2021   ? Progress Note Due on Visit 40   ? PT Start Time 1259   ? PT Stop Time 1344   ? PT Time Calculation (min) 45 min   ? Equipment Utilized During Treatment Gait belt   ? Activity Tolerance Patient tolerated treatment well   ? Behavior During Therapy [facility] for tasks assessed/performed   ? ?  ?  ? ?  ? ? ?Past Medical History:  ?Diagnosis Date  ? ADD (attention deficit disorder)   ? Allergy   ? seasonal flares of perinneal allergies  ? Cancer [facility])   ? melanoma  ? Cardiac arrest [facility]) 07/1997  ? aborted  ? Dementia ([place])   ? post resusitative  ? Depression   ? Diverticulosis   ? Family hx of prostate cancer   ? GERD (gastroesophageal reflux disease)   ? Headache(784.0)   ? Hyperlipidemia   ? Hypothyroidism   ? ICD (implantable cardiac defibrillator) in place   ? Idiopathic urticaria   ? Internal hemorrhoids   ? Low back pain syndrome   ? Melanoma in situ of skin of trunk ([place])   ? chest  ? PVC (premature ventricular contraction)   ? associated with cardiac arrest  ? Sleep apnea   ? no c-pap  ? Tubular adenoma of colon 10/2010  ? ? ?Past Surgical History:  ?Procedure Laterality Date  ?  implantation x2    ? CARDIAC DEFIBRILLATOR PLACEMENT  07/01/2010  ? Explanation of a previously implanted device, pocket revision, and insertion of a new device, and intraoperative defibrillation threshold testing Caryl Comes)  ? CARDIAC  DEFIBRILLATOR REMOVAL  2004  ? Defibrillator change Caryl Comes) 3 times changed  ? CATARACT EXTRACTION W/PHACO Left 02/04/2021  ? Procedure: CATARACT EXTRACTION PHACO AND INTRAOCULAR LENS PLACEMENT ([place]) LEFT VIVITY toric LENS 9.21 01:11.6;  Surgeon: Leandrew Koyanagi, MD;  Location: [place];  Service: Ophthalmology;  Laterality: Left;  keep patient at 11:30 arrival  ? CATARACT EXTRACTION W/PHACO Right 03/04/2021  ? Procedure: CATARACT EXTRACTION PHACO AND INTRAOCULAR LENS PLACEMENT ([place]) RIGHT VIVITY LENS 8.59 01:15.3;  Surgeon: Leandrew Koyanagi, MD;  Location: [place];  Service: Ophthalmology;  Laterality: Right;  ? HEMORRHOID SURGERY    ? ICD    ? TONSILLECTOMY    ? ? ?There were no vitals filed for this visit. ? ? Subjective Assessment - 11/09/21 1301   ? ? Subjective Patient reports his neck is tight, has had some stumbles but no falls since seen last.   ? Patient is accompained by: Family member   ? Pertinent History Per note submitted by Dr. Krista Blue on 12/15/2020- The patient has an underlying medical history of cardiac death from ventricular fibrillation with hypoxic brain injury in Jan 1999 due to arrythmia, 15 minutes without heart beat,  status post defibrillator placement, He stayed in hospital for 35 days. On 12/16/2020-Gait abnormality, Also related to the  previous anoxic brain injury, deconditioning,   ? Limitations Walking;House hold activities   ? How long can you sit comfortably? no limitations   ? How long can you stand comfortably? Not really limited   ? How long can you walk comfortably? 15-20   ? Patient Stated Goals To improve transfers, balance and walking.   ? Currently in Pain? No/denies   ? ?  ?  ? ?  ? ? ? ? ? ?Trigger Point Dry Needling (TDN), unbilled ?Education performed with patient regarding potential benefit of TDN. Reviewed precautions and risks with patient. Reviewed special precautions/risks over lung fields which include pneumothorax. Reviewed signs and  symptoms of pneumothorax and advised pt to go to ER immediately if these symptoms develop advise them of dry needling treatment. Extensive time spent with pt to ensure full understanding of TDN risks. Pt provided verbal consent to treatment. TDN performed to  with 0.3 x 30 single needle placements with local twitch response (LTR). Pistoning technique utilized. Improved pain-free motion following intervention. Muscles targeted bilateral upper trap, cervical paraspinals x 8 minutes ? ?Supine: ?Cervical side bend with scapular retraction 10x with 3 second holds each side (added to HEP) ?Cervical distraction with towel with cervical extension and flexion 10x ?Robber stretch 30 seconds x 2 trials  ?Suboccipital release 3x30 second holds ? ?Prone:  ?CPA and UPA to thoracic spine x 3 minutes ? ?Standing: ?Opposite UE/LE raises 15x ? ?ambulate in hallway: ?-horizontal head turns with cues for reading alphabet from cards 86 ftx 2 sets ?-horizontal head turns with cues for reading number and symbols from cards 86 ft x 2 sets  ?-?red light/green light? for sudden initiation/termination of ambulation with close CGA for carryover to natural environment 2x 86 ft  ? ?Seated: ?Sit to stand weighted ball raise 15x ? ?Pt educated throughout session about proper posture and technique with exercises. Improved exercise technique, movement at target joints, use of target muscles after min to mod verbal, visual, tactile cues. ? ? ? ? ?Patient educated on cervical ROM and pain reduction techniques with patient demonstrating understanding. He is highly motivated throughout session. He has immediate increase in muscle tissue length s/p TDN. Pt will continue to benefit from skilled PT intervention in order to improve his strength, balance, mobility and QOL ? ? ? ? ? ? ? ? ? ? ? ? ? ? ? ? ? ? ? ? PT Education - 11/09/21 1252   ? ? Education Details exercise technique, Arts development officer   ? Person(s) Educated Patient   ? Methods  Demonstration;Explanation;Tactile cues;Verbal cues   ? Comprehension Verbalized understanding;Returned demonstration;Verbal cues required;Tactile cues required   ? ?  ?  ? ?  ? ? ? PT Short Term Goals - 03/18/21 1306   ? ?  ? PT SHORT TERM GOAL #1  ? Title Pt will be independent with HEP in order to improve strength and balance in order to decrease fall risk and improve function at home and work.   ? Baseline 01/27/2021- Patient presents with no formal HEP in place. 03/18/2021- Patient is knowledgeable of HEP- WIfe reports he is not always compliant but not because I don't know what to do."   ? Time 6   ? Period Weeks   ? Status Achieved   ? Target Date 03/10/21   ? ?  ?  ? ?  ? ? ? ? PT Long Term Goals - 10/28/21 1513   ? ?  ? PT LONG TERM GOAL #  1  ? Title Pt will improve BERG by at least 5 points in order to demonstrate clinically significant improvement in balance.   ? Baseline 01/27/2021= 43/56; 03/18/2021=50/56   ? Time 12   ? Period Weeks   ? Status Achieved   ?  ? PT LONG TERM GOAL #2  ? Title Pt will improve FOTO to target score of 55% to display perceived improvements in ability to complete ADL's.   ? Baseline 01/27/2021= 42%; 04/22/2021- Will assess next visit; 05/06/2021= 64%   ? Time 12   ? Period Weeks   ? Status Achieved   ?  ? PT LONG TERM GOAL #3  ? Title Pt will decrease 5TSTS by at least 3 seconds in order to demonstrate clinically significant improvement in LE strength.   ? Baseline 01/27/2021= 30.07 without UE support; 03/18/2021= 24.78 sec without UE Support; 04/22/2021= 14.47 sec without UE support; 07/15/2021- 17.43 sec without UE support   ? Time 12   ? Period Weeks   ? Status Achieved   ?  ? PT LONG TERM GOAL #4  ? Title Pt will decrease TUG to below 15  seconds/decrease in order to demonstrate decreased fall risk.   ? Baseline 01/27/2021= 20.97 without an AD; 03/18/2021= 19.2 sec without AD; 04/22/2021= 17 sec without AD; 05/13/2021= 14.88 sec. 07/15/2021= 14.7 sec avg without AD   ? Time 12   ? Period  Weeks   ? Status Achieved   ? Target Date 07/15/21   ?  ? PT LONG TERM GOAL #5  ? Title Pt will increase 10MWT by at least 0.13 m/s in order to demonstrate clinically significant improvement in community ambulation.   ? [place]

## 2021-11-09 NOTE — Telephone Encounter (Signed)
Rx refilled as per last office visit note. 

## 2021-11-10 DIAGNOSIS — Z961 Presence of intraocular lens: Secondary | ICD-10-CM | POA: Diagnosis not present

## 2021-11-11 ENCOUNTER — Ambulatory Visit: Payer: Medicare Other

## 2021-11-18 ENCOUNTER — Ambulatory Visit: Payer: Medicare Other

## 2021-11-18 ENCOUNTER — Encounter: Payer: Self-pay | Admitting: *Deleted

## 2021-11-18 DIAGNOSIS — R269 Unspecified abnormalities of gait and mobility: Secondary | ICD-10-CM

## 2021-11-18 DIAGNOSIS — R2681 Unsteadiness on feet: Secondary | ICD-10-CM | POA: Diagnosis not present

## 2021-11-18 DIAGNOSIS — R296 Repeated falls: Secondary | ICD-10-CM

## 2021-11-18 DIAGNOSIS — M542 Cervicalgia: Secondary | ICD-10-CM | POA: Diagnosis not present

## 2021-11-18 DIAGNOSIS — R2689 Other abnormalities of gait and mobility: Secondary | ICD-10-CM

## 2021-11-18 DIAGNOSIS — R262 Difficulty in walking, not elsewhere classified: Secondary | ICD-10-CM | POA: Diagnosis not present

## 2021-11-18 DIAGNOSIS — R278 Other lack of coordination: Secondary | ICD-10-CM

## 2021-11-18 DIAGNOSIS — M6281 Muscle weakness (generalized): Secondary | ICD-10-CM | POA: Diagnosis not present

## 2021-11-18 NOTE — Therapy (Signed)
[place] MAIN [facility] [address] [place], [place], [address] Phone: [phone]   Fax:  [phone]  Physical Therapy Treatment  Patient Details  Name: DELON REVELO MRN: [id number] Date of Birth: 08-10-1950 Referring Provider (PT): Dr. Krista Blue   Encounter Date: 11/18/2021   PT End of Session - 11/18/21 0942     Visit Number 43    Number of Visits 2    Date for PT Re-Evaluation 12/29/21    Authorization Type UHC Medicare    Authorization Time Period 01/27/2021- 78/24/2353; Recert 61/44/3154- 00/86/7619; 07/15/2021-10/07/2021; 10/06/2021- 12/29/2021    Progress Note Due on Visit 64    PT Start Time 0932    PT Stop Time 1005    PT Time Calculation (min) 33 min    Equipment Utilized During Treatment Gait belt    Activity Tolerance Patient tolerated treatment well    Behavior During Therapy WFL for tasks assessed/performed             Past Medical History:  Diagnosis Date   ADD (attention deficit disorder)    Allergy    seasonal flares of perinneal allergies   Cancer ([place])    melanoma   Cardiac arrest ([place]) 07/1997   aborted   Dementia ([place])    post resusitative   Depression    Diverticulosis    Family hx of prostate cancer    GERD (gastroesophageal reflux disease)    Headache(784.0)    Hyperlipidemia    Hypothyroidism    ICD (implantable cardiac defibrillator) in place    Idiopathic urticaria    Internal hemorrhoids    Low back pain syndrome    Melanoma in situ of skin of trunk ([place])    chest   PVC (premature ventricular contraction)    associated with cardiac arrest   Sleep apnea    no c-pap   Tubular adenoma of colon 10/2010    Past Surgical History:  Procedure Laterality Date    implantation x2     CARDIAC DEFIBRILLATOR PLACEMENT  07/01/2010   Explanation of a previously implanted device, pocket revision, and insertion of a new device, and intraoperative defibrillation threshold testing Caryl Comes)   CARDIAC  DEFIBRILLATOR REMOVAL  2004   Defibrillator change Caryl Comes) 3 times changed   CATARACT EXTRACTION W/PHACO Left 02/04/2021   Procedure: CATARACT EXTRACTION PHACO AND INTRAOCULAR LENS PLACEMENT ([place]) LEFT VIVITY toric LENS 9.21 01:11.6;  Surgeon: Leandrew Koyanagi, MD;  Location: [place];  Service: Ophthalmology;  Laterality: Left;  keep patient at 11:30 arrival   CATARACT EXTRACTION W/PHACO Right 03/04/2021   Procedure: CATARACT EXTRACTION PHACO AND INTRAOCULAR LENS PLACEMENT ([place]) RIGHT VIVITY LENS 8.59 01:15.3;  Surgeon: Leandrew Koyanagi, MD;  Location: [place];  Service: Ophthalmology;  Laterality: Right;   HEMORRHOID SURGERY     ICD     TONSILLECTOMY      There were no vitals filed for this visit.   Subjective Assessment - 11/18/21 0935     Subjective Patient reports his neck did feel better but stiff again. He reports no falls and no pain. He states he will need to leave early today due to having another appointment.    Patient is accompained by: Family member    Pertinent History Per note submitted by Dr. Krista Blue on 12/15/2020- The patient has an underlying medical history of cardiac death from ventricular fibrillation with hypoxic brain injury in Jan 1999 due to arrythmia, 15 minutes without heart beat,  status post defibrillator  placement, He stayed in hospital for 35 days. On 12/16/2020-Gait abnormality, Also related to the previous anoxic brain injury, deconditioning,    Limitations Walking;House hold activities    How long can you sit comfortably? no limitations    How long can you stand comfortably? Not really limited    How long can you walk comfortably? 15-20    Patient Stated Goals To improve transfers, balance and walking.    Currently in Pain? No/denies              INTERVENTIONS:   Therapeutic Exercises: For LE strength and warm up for upcoming balance activities.   Seated hip march x 20 Alt LE Seated knee ext x 20 each LE Sit to stand x 20  without UE support  Neuromuscular re-ed/Coordination activities:   Alt LE step tap with no guidance- patient challenge to keep up with left/right LE  Lateral step up/down (left side up onto 6"block down to right side) then go back x 15 reps  Staggered standing at support bar- separating numbers/letters from jumbled mix into 5 colors using visual scanning and left/right arm for coordination with multiple episodes of LOB.   Dynamic side step (Tap) - initially standing on purple balance pad and side step tap onto cone place laterally on each of pad without UE support x 20 reps each.   Education provided throughout session via VC/TC and demonstration to facilitate movement at target joints and correct muscle activation for all testing and exercises performed.   *Treatment ended secondary to patient needed to leave 10 min early to go to another appointment.                        PT Education - 11/18/21 0941     Education Details Exercise technique    Person(s) Educated Patient    Methods Explanation;Demonstration;Tactile cues;Verbal cues    Comprehension Verbalized understanding;Returned demonstration;Verbal cues required;Tactile cues required;Need further instruction              PT Short Term Goals - 03/18/21 1306       PT SHORT TERM GOAL #1   Title Pt will be independent with HEP in order to improve strength and balance in order to decrease fall risk and improve function at home and work.    Baseline 01/27/2021- Patient presents with no formal HEP in place. 03/18/2021- Patient is knowledgeable of HEP- WIfe reports he is not always compliant but not because I don't know what to do."    Time 6    Period Weeks    Status Achieved    Target Date 03/10/21               PT Long Term Goals - 10/28/21 1513       PT LONG TERM GOAL #1   Title Pt will improve BERG by at least 5 points in order to demonstrate clinically significant improvement in balance.     Baseline 01/27/2021= 43/56; 03/18/2021=50/56    Time 12    Period Weeks    Status Achieved      PT LONG TERM GOAL #2   Title Pt will improve FOTO to target score of 55% to display perceived improvements in ability to complete ADL's.    Baseline 01/27/2021= 42%; 04/22/2021- Will assess next visit; 05/06/2021= 64%    Time 12    Period Weeks    Status Achieved      PT LONG TERM GOAL #3  Title Pt will decrease 5TSTS by at least 3 seconds in order to demonstrate clinically significant improvement in LE strength.    Baseline 01/27/2021= 30.07 without UE support; 03/18/2021= 24.78 sec without UE Support; 04/22/2021= 14.47 sec without UE support; 07/15/2021- 17.43 sec without UE support    Time 12    Period Weeks    Status Achieved      PT LONG TERM GOAL #4   Title Pt will decrease TUG to below 15  seconds/decrease in order to demonstrate decreased fall risk.    Baseline 01/27/2021= 20.97 without an AD; 03/18/2021= 19.2 sec without AD; 04/22/2021= 17 sec without AD; 05/13/2021= 14.88 sec. 07/15/2021= 14.7 sec avg without AD    Time 12    Period Weeks    Status Achieved    Target Date 07/15/21      PT LONG TERM GOAL #5   Title Pt will increase 10MWT by at least 0.13 m/s in order to demonstrate clinically significant improvement in community ambulation.    Baseline 01/27/2021= 0.67 m/s without an AD.  9/14= 0.75 m/s; 04/22/2021= 0.82 sec without an AD- WIll keep goal active to ensure patient can perform consistently. 05/13/2021= 0.89 m/s. 07/15/2021= 0.98 m/s    Time 12    Period Weeks    Status Achieved    Target Date 07/15/21      PT LONG TERM GOAL #6   Title Pt will increase 6MWT by at least 25m(1641f in order to demonstrate clinically significant improvement in cardiopulmonary endurance and community ambulation    Baseline 02/03/2021= 1100 feet; 03/18/21= 1125 feet without AD.  04/22/2021= 1175 feet without AD 07/15/2021= 1210 feet without an AD; 08/05/2021= 1190 feet without AD; 10/06/2021= 1250 feet     Time 11    Period Weeks    Status On-going    Target Date 12/29/21      PT LONG TERM GOAL #7   Title Pt will increase 10MWT to 1.0 m/s or greater in order to demonstrate clinically significant improvement in community ambulation.    Baseline 07/15/2021= 0.98 m/s; 10/06/2021= 1.1 m/s    Time 12    Period Weeks    Status Achieved    Target Date 10/07/21      PT LONG TERM GOAL #8   Title Patient will demonstrate improved flexibility in Cervical Rot and hamstring flexibility for improved functional independence in home and improved quality of life.    Baseline 10/06/2021=Hamstring length= 48 deg L; 55 deg R and Cervical Rotation- L=22/R=28 deg; 10/28/2021= Rot= L/R= 25/30    Time 12    Period Weeks    Status New    Target Date 12/29/21                   Plan - 11/18/21 0943     Clinical Impression Statement Patient presents with excellent motivation for today's session. He responded well to general LE and coordination based activities - He adapted well to all balance activities with less confusion or VC required today. Will resume cervical treatment next session. Pt will continue to benefit from skilled PT intervention in order to improve his strength, balance, mobility and QOL    Personal Factors and Comorbidities Comorbidity 3+    Comorbidities Anoxic brain injury (1999), Cardiac (pacemaker/debrillator), HTN    Examination-Activity Limitations Bathing;Caring for Others;Lift;Stairs;Transfers    Examination-Participation Restrictions Community Activity;Yard Work    Stability/Clinical Decision Making Stable/Uncomplicated    Rehab Potential Good    PT Frequency 1x /  week    PT Duration 12 weeks    PT Treatment/Interventions ADLs/Self Care Home Management;Cryotherapy;Moist Heat;DME Instruction;Gait training;Stair training;Functional mobility training;Therapeutic activities;Therapeutic exercise;Balance training;Neuromuscular re-education;Patient/family education;Manual  techniques;Passive range of motion;Dry needling;Canalith Repostioning;Ultrasound;Vestibular;Joint Manipulations    PT Next Visit Plan Progress cervcial ROM/LE flexibility, dry needling as appropriate,  and  LE strengthening and continue with  balance exercises.    PT Home Exercise Plan No changes this visit    Consulted and Agree with Plan of Care Patient;Family member/caregiver    Family Member Consulted Wife- [name]             Patient will benefit from skilled therapeutic intervention in order to improve the following deficits and impairments:  Abnormal gait, Decreased activity tolerance, Decreased balance, Decreased cognition, Decreased coordination, Decreased endurance, Decreased mobility, Decreased strength, Difficulty walking  Visit Diagnosis: Abnormality of gait and mobility  Difficulty in walking, not elsewhere classified  Muscle weakness (generalized)  Other abnormalities of gait and mobility  Other lack of coordination  Unsteadiness on feet  Repeated falls     Problem List Patient Active Problem List   Diagnosis Date Noted   Neck pain 07/02/2021   Gait abnormality 12/16/2020   Nonintractable headache 09/30/2020   Tinnitus 09/09/2019   History of sudden cardiac arrest 01/09/2019   Viral syndrome 09/06/2018   Seizures ([place]) 03/02/2018   Syncope 12/26/2017   Other social stressor 12/26/2017   Health care maintenance 08/14/2017   Family hx of prostate cancer    Allergy    Insomnia 10/20/2015   Advance care planning 03/08/2014   Memory loss 09/14/2013   Hypoxic brain injury ([place]) 09/14/2013   Medicare annual wellness visit, subsequent 02/16/2013   Shortness of breath 08/10/2011   Implantable cardioverter-defibrillator (ICD) in situ 02/03/2009   Hypothyroidism 11/18/2008   CONTACT DERMATITIS&OTHER ECZEMA DUE TO PLANTS 02/03/2008   IDIOPATHIC URTICARIA 02/03/2008   Hypertension 02/03/2008   HLD (hyperlipidemia) Nov 04, 2007   Attention deficit disorder  04-Nov-2007   PVC/VT non sustained 11/04/07   Sleep apnea 11/04/07   HEADACHE 11-04-07   SUDDEN DEATH-aborted 11-04-07   LOW BACK PAIN SYNDROME 10/11/2007    Lewis Moccasin, PT 11/19/2021, 5:01 PM  [place] [facility] MAIN [facility] SERVICES [address] [place], [place], [address] Phone: [phone]   Fax:  [phone]  Name: SAVIR BLANKE MRN: [id number] Date of Birth: 18-Oct-1950

## 2021-11-25 ENCOUNTER — Ambulatory Visit: Payer: Medicare Other

## 2021-11-25 DIAGNOSIS — R262 Difficulty in walking, not elsewhere classified: Secondary | ICD-10-CM

## 2021-11-25 DIAGNOSIS — R2689 Other abnormalities of gait and mobility: Secondary | ICD-10-CM

## 2021-11-25 DIAGNOSIS — R296 Repeated falls: Secondary | ICD-10-CM

## 2021-11-25 DIAGNOSIS — M6281 Muscle weakness (generalized): Secondary | ICD-10-CM | POA: Diagnosis not present

## 2021-11-25 DIAGNOSIS — R278 Other lack of coordination: Secondary | ICD-10-CM | POA: Diagnosis not present

## 2021-11-25 DIAGNOSIS — R269 Unspecified abnormalities of gait and mobility: Secondary | ICD-10-CM

## 2021-11-25 DIAGNOSIS — M542 Cervicalgia: Secondary | ICD-10-CM | POA: Diagnosis not present

## 2021-11-25 DIAGNOSIS — R2681 Unsteadiness on feet: Secondary | ICD-10-CM | POA: Diagnosis not present

## 2021-11-25 NOTE — Therapy (Signed)
[place] MAIN [facility] SERVICES [address] [place], [place], [address] Phone: [phone]   Fax:  [phone]  Physical Therapy Treatment  Patient Details  Name: Larry [name] MRN: [id number] Date of Birth: 1950-12-02 Referring Provider (PT): Dr. Krista Blue   Encounter Date: 11/25/2021   PT End of Session - 11/25/21 0901     Visit Number 44    Number of Visits 69    Date for PT Re-Evaluation 12/29/21    Authorization Type [employer] Medicare    Authorization Time Period 01/27/2021- 98/33/8250; Recert 53/97/6734- 19/37/9024; 07/15/2021-10/07/2021; 10/06/2021- 12/29/2021    Progress Note Due on Visit 82    PT Start Time 0852    PT Stop Time 0930    PT Time Calculation (min) 38 min    Equipment Utilized During Treatment Gait belt    Activity Tolerance Patient tolerated treatment well    Behavior During Therapy [facility] for tasks assessed/performed             Past Medical History:  Diagnosis Date   ADD (attention deficit disorder)    Allergy    seasonal flares of perinneal allergies   Cancer ([place])    melanoma   Cardiac arrest ([place]) 07/1997   aborted   Dementia ([place])    post resusitative   Depression    Diverticulosis    Family hx of prostate cancer    GERD (gastroesophageal reflux disease)    Headache(784.0)    Hyperlipidemia    Hypothyroidism    ICD (implantable cardiac defibrillator) in place    Idiopathic urticaria    Internal hemorrhoids    Low back pain syndrome    Melanoma in situ of skin of trunk ([place])    chest   PVC (premature ventricular contraction)    associated with cardiac arrest   Sleep apnea    no c-pap   Tubular adenoma of colon 10/2010    Past Surgical History:  Procedure Laterality Date    implantation x2     CARDIAC DEFIBRILLATOR PLACEMENT  07/01/2010   Explanation of a previously implanted device, pocket revision, and insertion of a new device, and intraoperative defibrillation threshold testing Caryl Comes)   CARDIAC  DEFIBRILLATOR REMOVAL  2004   Defibrillator change Caryl Comes) 3 times changed   CATARACT EXTRACTION W/PHACO Left 02/04/2021   Procedure: CATARACT EXTRACTION PHACO AND INTRAOCULAR LENS PLACEMENT ([place]) LEFT VIVITY toric LENS 9.21 01:11.6;  Surgeon: Leandrew Koyanagi, MD;  Location: [place];  Service: Ophthalmology;  Laterality: Left;  keep patient at 11:30 arrival   CATARACT EXTRACTION W/PHACO Right 03/04/2021   Procedure: CATARACT EXTRACTION PHACO AND INTRAOCULAR LENS PLACEMENT ([place]) RIGHT VIVITY LENS 8.59 01:15.3;  Surgeon: Leandrew Koyanagi, MD;  Location: [place];  Service: Ophthalmology;  Laterality: Right;   HEMORRHOID SURGERY     ICD     TONSILLECTOMY      There were no vitals filed for this visit.   Subjective Assessment - 11/25/21 0858     Subjective Patient reports ongoing stiffness in neck. Denies any falls.    Patient is accompained by: Family member    Pertinent History Per note submitted by Dr. Krista Blue on 12/15/2020- The patient has an underlying medical history of cardiac death from ventricular fibrillation with hypoxic brain injury in Jan 1999 due to arrythmia, 15 minutes without heart beat,  status post defibrillator placement, He stayed in hospital for 35 days. On 12/16/2020-Gait abnormality, Also related to the previous anoxic brain injury, deconditioning,  Limitations Walking;House hold activities    How long can you sit comfortably? no limitations    How long can you stand comfortably? Not really limited    How long can you walk comfortably? 15-20    Patient Stated Goals To improve transfers, balance and walking.    Currently in Pain? Yes    Pain Score 7     Pain Location Neck    Pain Orientation Right;Posterior    Pain Descriptors / Indicators Aching;Tightness    Pain Type Chronic pain    Pain Onset More than a month ago    Pain Frequency Intermittent    Aggravating Factors  Active cervical Rotation    Pain Relieving Factors Rest    Effect of  Pain on Daily Activities Difficulty with driving and any other activites involving turning his head.                INTERVENTIONS:   Moist heat to cervical region during stretching.   Due to patient arriving late- Did not obtain pre-treatment measures  Manual therapy: Extensive time performing Passive ROM to cervical Flex/ext/L&R SB/L&R Rotation.  STM to right side- UT/Levator/suboccipital region. Grade 3-4 PA and side glide to C4-T1 region x several minutes- No report of pain - only tightness.   Post treatment  cervical ROM:   L rotation= 32 deg R rotation = 38 deg L side bend= 12 deg Right Side bend= 18 deg *Patient reported feeling much better.   Therapeutic Exercises:   Reviewed AROM - Seated 20 reps each side.  -Cervical Flex -Cervical Ext -Cervical SB -Cervical Rot -Cervical retraction -Cervical Rotation with diagonal head turn (L & R x 20 reps each)    Education provided throughout session via VC/TC and demonstration to facilitate movement at target joints and correct muscle activation for all testing and exercises performed.              PT Education - 11/25/21 0900     Education Details Exercise technique    Person(s) Educated Patient    Methods Explanation;Demonstration;Tactile cues;Verbal cues    Comprehension Verbalized understanding;Returned demonstration;Verbal cues required;Tactile cues required;Need further instruction              PT Short Term Goals - 03/18/21 1306       PT SHORT TERM GOAL #1   Title Pt will be independent with HEP in order to improve strength and balance in order to decrease fall risk and improve function at home and work.    Baseline 01/27/2021- Patient presents with no formal HEP in place. 03/18/2021- Patient is knowledgeable of HEP- WIfe reports he is not always compliant but not because I don't know what to do."    Time 6    Period Weeks    Status Achieved    Target Date 03/10/21               PT  Long Term Goals - 10/28/21 1513       PT LONG TERM GOAL #1   Title Pt will improve BERG by at least 5 points in order to demonstrate clinically significant improvement in balance.    Baseline 01/27/2021= 43/56; 03/18/2021=50/56    Time 12    Period Weeks    Status Achieved      PT LONG TERM GOAL #2   Title Pt will improve FOTO to target score of 55% to display perceived improvements in ability to complete ADL's.    Baseline 01/27/2021= 42%; 04/22/2021- Will  assess next visit; 05/06/2021= 64%    Time 12    Period Weeks    Status Achieved      PT LONG TERM GOAL #3   Title Pt will decrease 5TSTS by at least 3 seconds in order to demonstrate clinically significant improvement in LE strength.    Baseline 01/27/2021= 30.07 without UE support; 03/18/2021= 24.78 sec without UE Support; 04/22/2021= 14.47 sec without UE support; 07/15/2021- 17.43 sec without UE support    Time 12    Period Weeks    Status Achieved      PT LONG TERM GOAL #4   Title Pt will decrease TUG to below 15  seconds/decrease in order to demonstrate decreased fall risk.    Baseline 01/27/2021= 20.97 without an AD; 03/18/2021= 19.2 sec without AD; 04/22/2021= 17 sec without AD; 05/13/2021= 14.88 sec. 07/15/2021= 14.7 sec avg without AD    Time 12    Period Weeks    Status Achieved    Target Date 07/15/21      PT LONG TERM GOAL #5   Title Pt will increase 10MWT by at least 0.13 m/s in order to demonstrate clinically significant improvement in community ambulation.    Baseline 01/27/2021= 0.67 m/s without an AD.  9/14= 0.75 m/s; 04/22/2021= 0.82 sec without an AD- WIll keep goal active to ensure patient can perform consistently. 05/13/2021= 0.89 m/s. 07/15/2021= 0.98 m/s    Time 12    Period Weeks    Status Achieved    Target Date 07/15/21      PT LONG TERM GOAL #6   Title Pt will increase 6MWT by at least 43m(161f in order to demonstrate clinically significant improvement in cardiopulmonary endurance and community ambulation     Baseline 02/03/2021= 1100 feet; 03/18/21= 1125 feet without AD.  04/22/2021= 1175 feet without AD 07/15/2021= 1210 feet without an AD; 08/05/2021= 1190 feet without AD; 10/06/2021= 1250 feet    Time 11    Period Weeks    Status On-going    Target Date 12/29/21      PT LONG TERM GOAL #7   Title Pt will increase 10MWT to 1.0 m/s or greater in order to demonstrate clinically significant improvement in community ambulation.    Baseline 07/15/2021= 0.98 m/s; 10/06/2021= 1.1 m/s    Time 12    Period Weeks    Status Achieved    Target Date 10/07/21      PT LONG TERM GOAL #8   Title Patient will demonstrate improved flexibility in Cervical Rot and hamstring flexibility for improved functional independence in home and improved quality of life.    Baseline 10/06/2021=Hamstring length= 48 deg L; 55 deg R and Cervical Rotation- L=22/R=28 deg; 10/28/2021= Rot= L/R= 25/30    Time 12    Period Weeks    Status New    Target Date 12/29/21                   Plan - 11/25/21 0901     Clinical Impression Statement Patient presents with good motivation for today's session. He presents with very restrictive cervical ROM and very tight with manual stretching today. He did respond well- Reported feeling looser after treatment, able to follow cues for all AROM, and demonstrated improved overall cervical ROM since initial evaluation. Pt will continue to benefit from skilled PT intervention in order to improve his strength, balance, mobility and QOL    Personal Factors and Comorbidities Comorbidity 3+    Comorbidities Anoxic brain  injury (1999), Cardiac (pacemaker/debrillator), HTN    Examination-Activity Limitations Bathing;Caring for Others;Lift;Stairs;Transfers    Examination-Participation Restrictions Community Activity;Yard Work    Stability/Clinical Decision Making Stable/Uncomplicated    Rehab Potential Good    PT Frequency 1x / week    PT Duration 12 weeks    PT Treatment/Interventions ADLs/Self Care  Home Management;Cryotherapy;Moist Heat;DME Instruction;Gait training;Stair training;Functional mobility training;Therapeutic activities;Therapeutic exercise;Balance training;Neuromuscular re-education;Patient/family education;Manual techniques;Passive range of motion;Dry needling;Canalith Repostioning;Ultrasound;Vestibular;Joint Manipulations    PT Next Visit Plan Progress cervcial ROM/LE flexibility, dry needling as appropriate,  and  LE strengthening and continue with  balance exercises.    PT Home Exercise Plan No changes this visit    Consulted and Agree with Plan of Care Patient;Family member/caregiver    Family Member Consulted Wife- [name]             Patient will benefit from skilled therapeutic intervention in order to improve the following deficits and impairments:  Abnormal gait, Decreased activity tolerance, Decreased balance, Decreased cognition, Decreased coordination, Decreased endurance, Decreased mobility, Decreased strength, Difficulty walking  Visit Diagnosis: Abnormality of gait and mobility  Difficulty in walking, not elsewhere classified  Muscle weakness (generalized)  Other abnormalities of gait and mobility  Other lack of coordination  Unsteadiness on feet  Repeated falls     Problem List Patient Active Problem List   Diagnosis Date Noted   Neck pain 07/02/2021   Gait abnormality 12/16/2020   Nonintractable headache 09/30/2020   Tinnitus 09/09/2019   History of sudden cardiac arrest 01/09/2019   Viral syndrome 09/06/2018   Seizures ([place]) 03/02/2018   Syncope 12/26/2017   Other social stressor 12/26/2017   Health care maintenance 08/14/2017   Family hx of prostate cancer    Allergy    Insomnia 10/20/2015   Advance care planning 03/08/2014   Memory loss 09/14/2013   Hypoxic brain injury ([place]) 09/14/2013   Medicare annual wellness visit, subsequent 02/16/2013   Shortness of breath 08/10/2011   Implantable cardioverter-defibrillator (ICD) in  situ 02/03/2009   Hypothyroidism 11/18/2008   CONTACT DERMATITIS&OTHER ECZEMA DUE TO PLANTS 02/03/2008   IDIOPATHIC URTICARIA 02/03/2008   Hypertension 02/03/2008   HLD (hyperlipidemia) 11-01-2007   Attention deficit disorder November 01, 2007   PVC/VT non sustained 11-01-2007   Sleep apnea Nov 01, 2007   HEADACHE 2007-11-01   SUDDEN DEATH-aborted 2007-11-01   LOW BACK PAIN SYNDROME 10/11/2007    Lewis Moccasin, PT 11/25/2021, 10:34 AM  [place] MAIN [facility] SERVICES [address] [place], [place], [address] Phone: [phone]   Fax:  [phone]  Name: Larry [name] MRN: [id number] Date of Birth: 02-15-1951

## 2021-11-26 ENCOUNTER — Encounter: Payer: Self-pay | Admitting: Internal Medicine

## 2021-12-02 ENCOUNTER — Ambulatory Visit: Payer: Medicare Other

## 2021-12-02 DIAGNOSIS — R2681 Unsteadiness on feet: Secondary | ICD-10-CM

## 2021-12-02 DIAGNOSIS — R296 Repeated falls: Secondary | ICD-10-CM

## 2021-12-02 DIAGNOSIS — M542 Cervicalgia: Secondary | ICD-10-CM

## 2021-12-02 DIAGNOSIS — M6281 Muscle weakness (generalized): Secondary | ICD-10-CM | POA: Diagnosis not present

## 2021-12-02 DIAGNOSIS — R278 Other lack of coordination: Secondary | ICD-10-CM | POA: Diagnosis not present

## 2021-12-02 DIAGNOSIS — R269 Unspecified abnormalities of gait and mobility: Secondary | ICD-10-CM

## 2021-12-02 DIAGNOSIS — R2689 Other abnormalities of gait and mobility: Secondary | ICD-10-CM | POA: Diagnosis not present

## 2021-12-02 DIAGNOSIS — R262 Difficulty in walking, not elsewhere classified: Secondary | ICD-10-CM

## 2021-12-02 NOTE — Therapy (Signed)
[place] [facility] SERVICES [address] [place], [place], [address] Phone: [phone]   Fax:  [phone]  Physical Therapy Treatment  Patient Details  Name: Larry [name] MRN: [id number] Date of Birth: 1950-12-27 Referring Provider (PT): Dr. Krista Blue   Encounter Date: 12/02/2021   PT End of Session - 12/02/21 0902     Visit Number 45    Number of Visits 66    Date for PT Re-Evaluation 12/29/21    Authorization Type [employer] Medicare    Authorization Time Period 01/27/2021- 41/66/0630; Recert 16/07/930- 35/57/3220; 07/15/2021-10/07/2021; 10/06/2021- 12/29/2021    Progress Note Due on Visit 75    PT Start Time 0846    PT Stop Time 0928    PT Time Calculation (min) 42 min    Equipment Utilized During Treatment Gait belt    Activity Tolerance Patient tolerated treatment well    Behavior During Therapy [facility] for tasks assessed/performed             Past Medical History:  Diagnosis Date   ADD (attention deficit disorder)    Allergy    seasonal flares of perinneal allergies   Cancer ([place])    melanoma   Cardiac arrest ([place]) 07/1997   aborted   Dementia ([place])    post resusitative   Depression    Diverticulosis    Family hx of prostate cancer    GERD (gastroesophageal reflux disease)    Headache(784.0)    Hyperlipidemia    Hypothyroidism    ICD (implantable cardiac defibrillator) in place    Idiopathic urticaria    Internal hemorrhoids    Low back pain syndrome    Melanoma in situ of skin of trunk ([place])    chest   PVC (premature ventricular contraction)    associated with cardiac arrest   Sleep apnea    no c-pap   Tubular adenoma of colon 10/2010    Past Surgical History:  Procedure Laterality Date    implantation x2     CARDIAC DEFIBRILLATOR PLACEMENT  07/01/2010   Explanation of a previously implanted device, pocket revision, and insertion of a new device, and intraoperative defibrillation threshold testing Caryl Comes)   CARDIAC  DEFIBRILLATOR REMOVAL  2004   Defibrillator change Caryl Comes) 3 times changed   CATARACT EXTRACTION W/PHACO Left 02/04/2021   Procedure: CATARACT EXTRACTION PHACO AND INTRAOCULAR LENS PLACEMENT ([place]) LEFT VIVITY toric LENS 9.21 01:11.6;  Surgeon: Leandrew Koyanagi, MD;  Location: [place];  Service: Ophthalmology;  Laterality: Left;  keep patient at 11:30 arrival   CATARACT EXTRACTION W/PHACO Right 03/04/2021   Procedure: CATARACT EXTRACTION PHACO AND INTRAOCULAR LENS PLACEMENT ([place]) RIGHT VIVITY LENS 8.59 01:15.3;  Surgeon: Leandrew Koyanagi, MD;  Location: [place];  Service: Ophthalmology;  Laterality: Right;   HEMORRHOID SURGERY     ICD     TONSILLECTOMY      There were no vitals filed for this visit.   Subjective Assessment - 12/02/21 0856     Subjective Patient reports ongoing soreness and stiffness in neck. Reports partial compliance with HEP.    Patient is accompained by: Family member    Pertinent History Per note submitted by Dr. Krista Blue on 12/15/2020- The patient has an underlying medical history of cardiac death from ventricular fibrillation with hypoxic brain injury in Jan 1999 due to arrythmia, 15 minutes without heart beat,  status post defibrillator placement, He stayed in hospital for 35 days. On 12/16/2020-Gait abnormality, Also related to the previous anoxic brain  injury, deconditioning,    Limitations Walking;House hold activities    How long can you sit comfortably? no limitations    How long can you stand comfortably? Not really limited    How long can you walk comfortably? 15-20    Patient Stated Goals To improve transfers, balance and walking.    Currently in Pain? Yes    Pain Score 7     Pain Location Neck    Pain Orientation Right;Upper    Pain Descriptors / Indicators Aching;Sore;Tightness    Pain Type Chronic pain    Pain Onset More than a month ago              INTERVENTIONS:   Pre-treatment cervical ROM:   R rotation= 20 L  roation= 18 R SB= 10  L SB= 10    Manual therapy:  Passive ROM to cervical Flex/ext/L&R SB/L&R Rotation.  Prolonged manual stretch with cervical Rotation and Sidebend STM to right side- UT/Levator/suboccipital region. Grade 3-4 PA and side glide to C4-T1 region Grade 2 Rotational glides with passive Rotation   Post treatment  cervical ROM:   L rotation= 28 deg R rotation = 32 deg L side bend= 12 deg Right Side bend= 20 deg *Patient reported feeling sore but loose    Therapeutic Exercises:    Instructed in supine chin tuck x 10 (hold 3 sec)  Instructed in supine pectoral stretch with   Education provided throughout session via VC/TC and demonstration to facilitate movement at target joints and correct muscle activation for all testing and exercises performed.                             PT Education - 12/02/21 0900     Education Details Review of importance of compliance with HEP for max rehab benefit    Person(s) Educated Patient    Methods Explanation;Demonstration;Tactile cues;Verbal cues    Comprehension Returned demonstration;Verbal cues required;Verbalized understanding;Tactile cues required;Need further instruction              PT Short Term Goals - 03/18/21 1306       PT SHORT TERM GOAL #1   Title Pt will be independent with HEP in order to improve strength and balance in order to decrease fall risk and improve function at home and work.    Baseline 01/27/2021- Patient presents with no formal HEP in place. 03/18/2021- Patient is knowledgeable of HEP- WIfe reports he is not always compliant but not because I don't know what to do."    Time 6    Period Weeks    Status Achieved    Target Date 03/10/21               PT Long Term Goals - 10/28/21 1513       PT LONG TERM GOAL #1   Title Pt will improve BERG by at least 5 points in order to demonstrate clinically significant improvement in balance.    Baseline 01/27/2021= 43/56;  03/18/2021=50/56    Time 12    Period Weeks    Status Achieved      PT LONG TERM GOAL #2   Title Pt will improve FOTO to target score of 55% to display perceived improvements in ability to complete ADL's.    Baseline 01/27/2021= 42%; 04/22/2021- Will assess next visit; 05/06/2021= 64%    Time 12    Period Weeks    Status Achieved  PT LONG TERM GOAL #3   Title Pt will decrease 5TSTS by at least 3 seconds in order to demonstrate clinically significant improvement in LE strength.    Baseline 01/27/2021= 30.07 without UE support; 03/18/2021= 24.78 sec without UE Support; 04/22/2021= 14.47 sec without UE support; 07/15/2021- 17.43 sec without UE support    Time 12    Period Weeks    Status Achieved      PT LONG TERM GOAL #4   Title Pt will decrease TUG to below 15  seconds/decrease in order to demonstrate decreased fall risk.    Baseline 01/27/2021= 20.97 without an AD; 03/18/2021= 19.2 sec without AD; 04/22/2021= 17 sec without AD; 05/13/2021= 14.88 sec. 07/15/2021= 14.7 sec avg without AD    Time 12    Period Weeks    Status Achieved    Target Date 07/15/21      PT LONG TERM GOAL #5   Title Pt will increase 10MWT by at least 0.13 m/s in order to demonstrate clinically significant improvement in community ambulation.    Baseline 01/27/2021= 0.67 m/s without an AD.  9/14= 0.75 m/s; 04/22/2021= 0.82 sec without an AD- WIll keep goal active to ensure patient can perform consistently. 05/13/2021= 0.89 m/s. 07/15/2021= 0.98 m/s    Time 12    Period Weeks    Status Achieved    Target Date 07/15/21      PT LONG TERM GOAL #6   Title Pt will increase 6MWT by at least 87m(167f in order to demonstrate clinically significant improvement in cardiopulmonary endurance and community ambulation    Baseline 02/03/2021= 1100 feet; 03/18/21= 1125 feet without AD.  04/22/2021= 1175 feet without AD 07/15/2021= 1210 feet without an AD; 08/05/2021= 1190 feet without AD; 10/06/2021= 1250 feet    Time 11    Period Weeks     Status On-going    Target Date 12/29/21      PT LONG TERM GOAL #7   Title Pt will increase 10MWT to 1.0 m/s or greater in order to demonstrate clinically significant improvement in community ambulation.    Baseline 07/15/2021= 0.98 m/s; 10/06/2021= 1.1 m/s    Time 12    Period Weeks    Status Achieved    Target Date 10/07/21      PT LONG TERM GOAL #8   Title Patient will demonstrate improved flexibility in Cervical Rot and hamstring flexibility for improved functional independence in home and improved quality of life.    Baseline 10/06/2021=Hamstring length= 48 deg L; 55 deg R and Cervical Rotation- L=22/R=28 deg; 10/28/2021= Rot= L/R= 25/30    Time 12    Period Weeks    Status New    Target Date 12/29/21                   Plan - 12/02/21 0902     Clinical Impression Statement Patient continues to present with ongoing increased stiffness in cervical region. He only admits to partial compliance with HEP. Spent time discussing purpose of PT and importance of compliance with daily stretching for max benefit. He continues to respond well to manual therapy in clinic with much improved overall ROM after stretching. Pt will continue to benefit from skilled PT intervention in order to improve his strength, balance, mobility and QOL    Personal Factors and Comorbidities Comorbidity 3+    Comorbidities Anoxic brain injury (1999), Cardiac (pacemaker/debrillator), HTN    Examination-Activity Limitations Bathing;Caring for Others;Lift;Stairs;Transfers    Examination-Participation Restrictions Community Activity;[employer]  Work    Stability/Clinical Decision Making Stable/Uncomplicated    Rehab Potential Good    PT Frequency 1x / week    PT Duration 12 weeks    PT Treatment/Interventions ADLs/Self Care Home Management;Cryotherapy;Moist Heat;DME Instruction;Gait training;Stair training;Functional mobility training;Therapeutic activities;Therapeutic exercise;Balance training;Neuromuscular  re-education;Patient/family education;Manual techniques;Passive range of motion;Dry needling;Canalith Repostioning;Ultrasound;Vestibular;Joint Manipulations    PT Next Visit Plan Progress cervcial ROM/LE flexibility, dry needling as appropriate,  and  LE strengthening and continue with  balance exercises.    PT Home Exercise Plan No changes this visit    Consulted and Agree with Plan of Care Patient;Family member/caregiver    Family Member Consulted Wife- [name]             Patient will benefit from skilled therapeutic intervention in order to improve the following deficits and impairments:  Abnormal gait, Decreased activity tolerance, Decreased balance, Decreased cognition, Decreased coordination, Decreased endurance, Decreased mobility, Decreased strength, Difficulty walking  Visit Diagnosis: Abnormality of gait and mobility  Difficulty in walking, not elsewhere classified  Muscle weakness (generalized)  Other abnormalities of gait and mobility  Other lack of coordination  Unsteadiness on feet  Repeated falls  Cervicalgia     Problem List Patient Active Problem List   Diagnosis Date Noted   Neck pain 07/02/2021   Gait abnormality 12/16/2020   Nonintractable headache 09/30/2020   Tinnitus 09/09/2019   History of sudden cardiac arrest 01/09/2019   Viral syndrome 09/06/2018   Seizures ([place]) 03/02/2018   Syncope 12/26/2017   Other social stressor 12/26/2017   Health care maintenance 08/14/2017   Family hx of prostate cancer    Allergy    Insomnia 10/20/2015   Advance care planning 03/08/2014   Memory loss 09/14/2013   Hypoxic brain injury ([place]) 09/14/2013   Medicare annual wellness visit, subsequent 02/16/2013   Shortness of breath 08/10/2011   Implantable cardioverter-defibrillator (ICD) in situ 02/03/2009   Hypothyroidism 11/18/2008   CONTACT DERMATITIS&OTHER ECZEMA DUE TO PLANTS 02/03/2008   IDIOPATHIC URTICARIA 02/03/2008   Hypertension 02/03/2008   HLD  (hyperlipidemia) 2007/11/21   Attention deficit disorder Nov 21, 2007   PVC/VT non sustained 21-Nov-2007   Sleep apnea Nov 21, 2007   HEADACHE 11-21-2007   SUDDEN DEATH-aborted 2007-11-21   LOW BACK PAIN SYNDROME 10/11/2007  Rationale for Evaluation and Treatment Rehabilitation   Lewis Moccasin, PT 12/02/2021, 5:49 PM  [place] MAIN [facility] SERVICES [address] [place], [place], [address] Phone: [phone]   Fax:  [phone]  Name: Larry [name] MRN: [id number] Date of Birth: 1950/12/19

## 2021-12-09 ENCOUNTER — Ambulatory Visit: Payer: Medicare Other | Attending: Neurology

## 2021-12-09 DIAGNOSIS — R278 Other lack of coordination: Secondary | ICD-10-CM | POA: Insufficient documentation

## 2021-12-09 DIAGNOSIS — R269 Unspecified abnormalities of gait and mobility: Secondary | ICD-10-CM | POA: Insufficient documentation

## 2021-12-09 DIAGNOSIS — R2689 Other abnormalities of gait and mobility: Secondary | ICD-10-CM | POA: Diagnosis not present

## 2021-12-09 DIAGNOSIS — M6281 Muscle weakness (generalized): Secondary | ICD-10-CM | POA: Insufficient documentation

## 2021-12-09 DIAGNOSIS — R262 Difficulty in walking, not elsewhere classified: Secondary | ICD-10-CM | POA: Insufficient documentation

## 2021-12-09 DIAGNOSIS — R296 Repeated falls: Secondary | ICD-10-CM | POA: Diagnosis not present

## 2021-12-09 DIAGNOSIS — R2681 Unsteadiness on feet: Secondary | ICD-10-CM | POA: Insufficient documentation

## 2021-12-09 DIAGNOSIS — M542 Cervicalgia: Secondary | ICD-10-CM | POA: Diagnosis not present

## 2021-12-09 NOTE — Therapy (Signed)
[place] MAIN [facility] SERVICES [address] [place], [place], [address] Phone: [phone]   Fax:  [phone]  Physical Therapy Treatment  Patient Details  Name: Larry [name] MRN: [id number] Date of Birth: 1951-06-19 Referring Provider (PT): Dr. Krista Blue   Encounter Date: 12/09/2021   PT End of Session - 12/09/21 0918     Visit Number 46    Number of Visits 56    Date for PT Re-Evaluation 12/29/21    Authorization Type [employer] Medicare    Authorization Time Period 01/27/2021- 29/47/6546; Recert 50/35/4656- 81/27/5170; 07/15/2021-10/07/2021; 10/06/2021- 12/29/2021    Progress Note Due on Visit 67    PT Start Time 0846    PT Stop Time 0928    PT Time Calculation (min) 42 min    Equipment Utilized During Treatment Gait belt    Activity Tolerance Patient tolerated treatment well    Behavior During Therapy [facility] for tasks assessed/performed             Past Medical History:  Diagnosis Date   ADD (attention deficit disorder)    Allergy    seasonal flares of perinneal allergies   Cancer ([place])    melanoma   Cardiac arrest ([place]) 07/1997   aborted   Dementia ([place])    post resusitative   Depression    Diverticulosis    Family hx of prostate cancer    GERD (gastroesophageal reflux disease)    Headache(784.0)    Hyperlipidemia    Hypothyroidism    ICD (implantable cardiac defibrillator) in place    Idiopathic urticaria    Internal hemorrhoids    Low back pain syndrome    Melanoma in situ of skin of trunk ([place])    chest   PVC (premature ventricular contraction)    associated with cardiac arrest   Sleep apnea    no c-pap   Tubular adenoma of colon 10/2010    Past Surgical History:  Procedure Laterality Date    implantation x2     CARDIAC DEFIBRILLATOR PLACEMENT  07/01/2010   Explanation of a previously implanted device, pocket revision, and insertion of a new device, and intraoperative defibrillation threshold testing Caryl Comes)   CARDIAC  DEFIBRILLATOR REMOVAL  2004   Defibrillator change Caryl Comes) 3 times changed   CATARACT EXTRACTION W/PHACO Left 02/04/2021   Procedure: CATARACT EXTRACTION PHACO AND INTRAOCULAR LENS PLACEMENT ([place]) LEFT VIVITY toric LENS 9.21 01:11.6;  Surgeon: Leandrew Koyanagi, MD;  Location: [place];  Service: Ophthalmology;  Laterality: Left;  keep patient at 11:30 arrival   CATARACT EXTRACTION W/PHACO Right 03/04/2021   Procedure: CATARACT EXTRACTION PHACO AND INTRAOCULAR LENS PLACEMENT ([place]) RIGHT VIVITY LENS 8.59 01:15.3;  Surgeon: Leandrew Koyanagi, MD;  Location: [place];  Service: Ophthalmology;  Laterality: Right;   HEMORRHOID SURGERY     ICD     TONSILLECTOMY      There were no vitals filed for this visit.   Subjective Assessment - 12/09/21 0917     Subjective Patient reports stiffness in his neck. Partial compliance of HEP.    Patient is accompained by: Family member    Pertinent History Per note submitted by Dr. Krista Blue on 12/15/2020- The patient has an underlying medical history of cardiac death from ventricular fibrillation with hypoxic brain injury in Jan 1999 due to arrythmia, 15 minutes without heart beat,  status post defibrillator placement, He stayed in hospital for 35 days. On 12/16/2020-Gait abnormality, Also related to the previous anoxic brain injury, deconditioning,  Limitations Walking;House hold activities    How long can you sit comfortably? no limitations    How long can you stand comfortably? Not really limited    How long can you walk comfortably? 15-20    Patient Stated Goals To improve transfers, balance and walking.    Currently in Pain? Yes    Pain Score 6     Pain Location Neck    Pain Orientation Right;Left    Pain Descriptors / Indicators Aching    Pain Type Chronic pain    Pain Onset More than a month ago             Manual therapy:    Suboccipital release 5x 30 seconds Cervical rotation with overpressure at glenohumeral joint and  occiput 2x 30 seconds Grade II mobilizations to cervical and thoracic spine x 6 minutes  Therapeutic Exercises: heat pad under neck and upper traps    Chin tuck tuck 12x 3 seconds  Overhead RTB Y 15x  Scapular retraction with cervical rotation 10x each side 5 second holds Towel under neck, distraction with cervical extension 12x x3 second holds  Seated row 15x GTB Seated cervical rotation 10x each side  Education provided throughout session via VC/TC and demonstration to facilitate movement at target joints and correct muscle activation for all testing and exercises performed.     Trigger Point Dry Needling (TDN), unbilled Education performed with patient regarding potential benefit of TDN. Reviewed precautions and risks with patient. Reviewed special precautions/risks over lung fields which include pneumothorax. Reviewed signs and symptoms of pneumothorax and advised pt to go to ER immediately if these symptoms develop advise them of dry needling treatment. Extensive time spent with pt to ensure full understanding of TDN risks. Pt provided verbal consent to treatment. TDN performed to  with 0.3 x 30 single needle placements with local twitch response (LTR). Pistoning technique utilized. Improved pain-free motion following intervention. Bilateral upper trap cervical paraspinals x 4 minutes  Patient reinforced need for stretching at home for progression of cervical pain reduction. Patient tolerates TDN, manual, and therex reporting decreased stiffness by end of session. Pt will continue to benefit from skilled PT intervention in order to improve his strength, balance, mobility and QOL                           PT Education - 12/09/21 0917     Education Details exercise technique, body mechanics, HEP compliance    Person(s) Educated Patient    Methods Explanation;Demonstration;Tactile cues;Verbal cues    Comprehension Verbalized understanding;Returned  demonstration;Verbal cues required;Tactile cues required              PT Short Term Goals - 03/18/21 1306       PT SHORT TERM GOAL #1   Title Pt will be independent with HEP in order to improve strength and balance in order to decrease fall risk and improve function at home and work.    Baseline 01/27/2021- Patient presents with no formal HEP in place. 03/18/2021- Patient is knowledgeable of HEP- WIfe reports he is not always compliant but not because I don't know what to do."    Time 6    Period Weeks    Status Achieved    Target Date 03/10/21               PT Long Term Goals - 10/28/21 1513       PT LONG TERM GOAL #1  Title Pt will improve BERG by at least 5 points in order to demonstrate clinically significant improvement in balance.    Baseline 01/27/2021= 43/56; 03/18/2021=50/56    Time 12    Period Weeks    Status Achieved      PT LONG TERM GOAL #2   Title Pt will improve FOTO to target score of 55% to display perceived improvements in ability to complete ADL's.    Baseline 01/27/2021= 42%; 04/22/2021- Will assess next visit; 05/06/2021= 64%    Time 12    Period Weeks    Status Achieved      PT LONG TERM GOAL #3   Title Pt will decrease 5TSTS by at least 3 seconds in order to demonstrate clinically significant improvement in LE strength.    Baseline 01/27/2021= 30.07 without UE support; 03/18/2021= 24.78 sec without UE Support; 04/22/2021= 14.47 sec without UE support; 07/15/2021- 17.43 sec without UE support    Time 12    Period Weeks    Status Achieved      PT LONG TERM GOAL #4   Title Pt will decrease TUG to below 15  seconds/decrease in order to demonstrate decreased fall risk.    Baseline 01/27/2021= 20.97 without an AD; 03/18/2021= 19.2 sec without AD; 04/22/2021= 17 sec without AD; 05/13/2021= 14.88 sec. 07/15/2021= 14.7 sec avg without AD    Time 12    Period Weeks    Status Achieved    Target Date 07/15/21      PT LONG TERM GOAL #5   Title Pt will  increase 10MWT by at least 0.13 m/s in order to demonstrate clinically significant improvement in community ambulation.    Baseline 01/27/2021= 0.67 m/s without an AD.  9/14= 0.75 m/s; 04/22/2021= 0.82 sec without an AD- WIll keep goal active to ensure patient can perform consistently. 05/13/2021= 0.89 m/s. 07/15/2021= 0.98 m/s    Time 12    Period Weeks    Status Achieved    Target Date 07/15/21      PT LONG TERM GOAL #6   Title Pt will increase 6MWT by at least 31m(1643f in order to demonstrate clinically significant improvement in cardiopulmonary endurance and community ambulation    Baseline 02/03/2021= 1100 feet; 03/18/21= 1125 feet without AD.  04/22/2021= 1175 feet without AD 07/15/2021= 1210 feet without an AD; 08/05/2021= 1190 feet without AD; 10/06/2021= 1250 feet    Time 11    Period Weeks    Status On-going    Target Date 12/29/21      PT LONG TERM GOAL #7   Title Pt will increase 10MWT to 1.0 m/s or greater in order to demonstrate clinically significant improvement in community ambulation.    Baseline 07/15/2021= 0.98 m/s; 10/06/2021= 1.1 m/s    Time 12    Period Weeks    Status Achieved    Target Date 10/07/21      PT LONG TERM GOAL #8   Title Patient will demonstrate improved flexibility in Cervical Rot and hamstring flexibility for improved functional independence in home and improved quality of life.    Baseline 10/06/2021=Hamstring length= 48 deg L; 55 deg R and Cervical Rotation- L=22/R=28 deg; 10/28/2021= Rot= L/R= 25/30    Time 12    Period Weeks    Status New    Target Date 12/29/21                   Plan - 12/09/21 0919     Clinical Impression Statement Patient  reinforced need for stretching at home for progression of cervical pain reduction. Patient tolerates TDN, manual, and therex reporting decreased stiffness by end of session. Pt will continue to benefit from skilled PT intervention in order to improve his strength, balance, mobility and QOL    Personal  Factors and Comorbidities Comorbidity 3+    Comorbidities Anoxic brain injury (1999), Cardiac (pacemaker/debrillator), HTN    Examination-Activity Limitations Bathing;Caring for Others;Lift;Stairs;Transfers    Examination-Participation Restrictions Community Activity;Yard Work    Stability/Clinical Decision Making Stable/Uncomplicated    Rehab Potential Good    PT Frequency 1x / week    PT Duration 12 weeks    PT Treatment/Interventions ADLs/Self Care Home Management;Cryotherapy;Moist Heat;DME Instruction;Gait training;Stair training;Functional mobility training;Therapeutic activities;Therapeutic exercise;Balance training;Neuromuscular re-education;Patient/family education;Manual techniques;Passive range of motion;Dry needling;Canalith Repostioning;Ultrasound;Vestibular;Joint Manipulations    PT Next Visit Plan Progress cervcial ROM/LE flexibility, dry needling as appropriate,  and  LE strengthening and continue with  balance exercises.    PT Home Exercise Plan No changes this visit    Consulted and Agree with Plan of Care Patient;Family member/caregiver    Family Member Consulted Wife- [name]             Patient will benefit from skilled therapeutic intervention in order to improve the following deficits and impairments:  Abnormal gait, Decreased activity tolerance, Decreased balance, Decreased cognition, Decreased coordination, Decreased endurance, Decreased mobility, Decreased strength, Difficulty walking  Visit Diagnosis: Abnormality of gait and mobility  Difficulty in walking, not elsewhere classified  Muscle weakness (generalized)  Cervicalgia     Problem List Patient Active Problem List   Diagnosis Date Noted   Neck pain 07/02/2021   Gait abnormality 12/16/2020   Nonintractable headache 09/30/2020   Tinnitus 09/09/2019   History of sudden cardiac arrest 01/09/2019   Viral syndrome 09/06/2018   Seizures ([place]) 03/02/2018   Syncope 12/26/2017   Other social stressor  12/26/2017   Health care maintenance 08/14/2017   Family hx of prostate cancer    Allergy    Insomnia 10/20/2015   Advance care planning 03/08/2014   Memory loss 09/14/2013   Hypoxic brain injury ([place]) 09/14/2013   Medicare annual wellness visit, subsequent 02/16/2013   Shortness of breath 08/10/2011   Implantable cardioverter-defibrillator (ICD) in situ 02/03/2009   Hypothyroidism 11/18/2008   CONTACT DERMATITIS&OTHER ECZEMA DUE TO PLANTS 02/03/2008   IDIOPATHIC URTICARIA 02/03/2008   Hypertension 02/03/2008   HLD (hyperlipidemia) 11/21/2007   Attention deficit disorder Nov 21, 2007   PVC/VT non sustained 11-21-07   Sleep apnea 11-21-2007   HEADACHE 11/21/07   SUDDEN DEATH-aborted 21-Nov-2007   LOW BACK PAIN SYNDROME 10/11/2007   Janna Arch, PT, DPT  12/09/2021, 9:57 AM  [place] [facility]IN [facility] SERVICES [address] [place], [place], [address] Phone: [phone]   Fax:  [phone]  Name: MONTRAE BRAITHWAITE MRN: [id number] Date of Birth: April 07, 1951

## 2021-12-16 ENCOUNTER — Ambulatory Visit: Payer: Medicare Other

## 2021-12-16 ENCOUNTER — Telehealth: Payer: Self-pay | Admitting: Cardiology

## 2021-12-16 DIAGNOSIS — R278 Other lack of coordination: Secondary | ICD-10-CM | POA: Diagnosis not present

## 2021-12-16 DIAGNOSIS — M6281 Muscle weakness (generalized): Secondary | ICD-10-CM

## 2021-12-16 DIAGNOSIS — R2689 Other abnormalities of gait and mobility: Secondary | ICD-10-CM | POA: Diagnosis not present

## 2021-12-16 DIAGNOSIS — M542 Cervicalgia: Secondary | ICD-10-CM | POA: Diagnosis not present

## 2021-12-16 DIAGNOSIS — R269 Unspecified abnormalities of gait and mobility: Secondary | ICD-10-CM

## 2021-12-16 DIAGNOSIS — R296 Repeated falls: Secondary | ICD-10-CM | POA: Diagnosis not present

## 2021-12-16 DIAGNOSIS — R262 Difficulty in walking, not elsewhere classified: Secondary | ICD-10-CM | POA: Diagnosis not present

## 2021-12-16 DIAGNOSIS — R2681 Unsteadiness on feet: Secondary | ICD-10-CM | POA: Diagnosis not present

## 2021-12-16 NOTE — Therapy (Signed)
[place] MAIN [facility] SERVICES [address] [place], [place], [address] Phone: [phone]   Fax:  [phone]  Physical Therapy Treatment  Patient Details  Name: Larry [name] MRN: [id number] Date of Birth: February 03, 1951 Referring Provider (PT): Dr. Krista Blue   Encounter Date: 12/16/2021   PT End of Session - 12/16/21 0836     Visit Number 47    Number of Visits 35    Date for PT Re-Evaluation 12/29/21    Authorization Type [employer] Medicare    Authorization Time Period 01/27/2021- 02/63/7858; Recert 85/08/7739- 28/78/6767; 07/15/2021-10/07/2021; 10/06/2021- 12/29/2021    Progress Note Due on Visit 71    PT Start Time 0802    PT Stop Time 0845    PT Time Calculation (min) 43 min    Equipment Utilized During Treatment Gait belt    Activity Tolerance Patient tolerated treatment well    Behavior During Therapy WFL for tasks assessed/performed             Past Medical History:  Diagnosis Date   ADD (attention deficit disorder)    Allergy    seasonal flares of perinneal allergies   Cancer ([name])    melanoma   Cardiac arrest ([place]) 07/1997   aborted   Dementia ([place])    post resusitative   Depression    Diverticulosis    Family hx of prostate cancer    GERD (gastroesophageal reflux disease)    Headache(784.0)    Hyperlipidemia    Hypothyroidism    ICD (implantable cardiac defibrillator) in place    Idiopathic urticaria    Internal hemorrhoids    Low back pain syndrome    Melanoma in situ of skin of trunk ([place])    chest   PVC (premature ventricular contraction)    associated with cardiac arrest   Sleep apnea    no c-pap   Tubular adenoma of colon 10/2010    Past Surgical History:  Procedure Laterality Date    implantation x2     CARDIAC DEFIBRILLATOR PLACEMENT  07/01/2010   Explanation of a previously implanted device, pocket revision, and insertion of a new device, and intraoperative defibrillation threshold testing Caryl Comes)   CARDIAC  DEFIBRILLATOR REMOVAL  2004   Defibrillator change Caryl Comes) 3 times changed   CATARACT EXTRACTION W/PHACO Left 02/04/2021   Procedure: CATARACT EXTRACTION PHACO AND INTRAOCULAR LENS PLACEMENT ([place]) LEFT VIVITY toric LENS 9.21 01:11.6;  Surgeon: Leandrew Koyanagi, MD;  Location: [place];  Service: Ophthalmology;  Laterality: Left;  keep patient at 11:30 arrival   CATARACT EXTRACTION W/PHACO Right 03/04/2021   Procedure: CATARACT EXTRACTION PHACO AND INTRAOCULAR LENS PLACEMENT ([place]) RIGHT VIVITY LENS 8.59 01:15.3;  Surgeon: Leandrew Koyanagi, MD;  Location: [place];  Service: Ophthalmology;  Laterality: Right;   HEMORRHOID SURGERY     ICD     TONSILLECTOMY      There were no vitals filed for this visit.   Subjective Assessment - 12/16/21 0806     Subjective Patient reports positive effect s/p needling that lasted for a few days but has returned.    Patient is accompained by: Family member    Pertinent History Per note submitted by Dr. Krista Blue on 12/15/2020- The patient has an underlying medical history of cardiac death from ventricular fibrillation with hypoxic brain injury in Jan 1999 due to arrythmia, 15 minutes without heart beat,  status post defibrillator placement, He stayed in hospital for 35 days. On 12/16/2020-Gait abnormality, Also related to the previous  anoxic brain injury, deconditioning,    Limitations Walking;House hold activities    How long can you sit comfortably? no limitations    How long can you stand comfortably? Not really limited    How long can you walk comfortably? 15-20    Patient Stated Goals To improve transfers, balance and walking.    Currently in Pain? Yes    Pain Score 3     Pain Location Neck    Pain Orientation Right;Left    Pain Descriptors / Indicators Aching    Pain Type Chronic pain    Pain Onset More than a month ago    Pain Frequency Intermittent                 Manual therapy:    Suboccipital release 5x 30  seconds Cervical rotation with overpressure at glenohumeral joint and occiput 3x 30 seconds Grade II mobilizations to cervical and thoracic spine x 6 minutes   Therapeutic Exercises: heat pad under neck and upper traps   Overhead GTB Y 15x  Scapular retraction with cervical rotation 10x each side 5 second holds Towel under neck, distraction with cervical extension 5x x3 second holds   Seated cervical rotation 10x each side   Education provided throughout session via VC/TC and demonstration to facilitate movement at target joints and correct muscle activation for all testing and exercises performed.      Trigger Point Dry Needling (TDN), unbilled Education performed with patient regarding potential benefit of TDN. Reviewed precautions and risks with patient. Reviewed special precautions/risks over lung fields which include pneumothorax. Reviewed signs and symptoms of pneumothorax and advised pt to go to ER immediately if these symptoms develop advise them of dry needling treatment. Extensive time spent with pt to ensure full understanding of TDN risks. Pt provided verbal consent to treatment. TDN performed to  with 0.3 x 30 single needle placements with local twitch response (LTR). Pistoning technique utilized. Improved pain-free motion following intervention. Bilateral upper trap cervical paraspinals x 16 minutes  Patient tolerated physical therapy session well with intermittent pain. He does have significant improvement of cervical ROM by end of session. Education on need for compliance with HEP. Patient does have multiple trigger point releases, discussed return to main therapist schedule with return to author as needed. Pt will continue to benefit from skilled PT intervention in order to improve his strength, balance, mobility and QOL                     PT Education - 12/16/21 0836     Education Details exercise technique, body mechanics, TDN    Person(s) Educated Patient     Methods Explanation;Demonstration;Tactile cues;Verbal cues    Comprehension Verbalized understanding;Verbal cues required;Tactile cues required;Returned demonstration              PT Short Term Goals - 03/18/21 1306       PT SHORT TERM GOAL #1   Title Pt will be independent with HEP in order to improve strength and balance in order to decrease fall risk and improve function at home and work.    Baseline 01/27/2021- Patient presents with no formal HEP in place. 03/18/2021- Patient is knowledgeable of HEP- WIfe reports he is not always compliant but not because I don't know what to do."    Time 6    Period Weeks    Status Achieved    Target Date 03/10/21  PT Long Term Goals - 10/28/21 1513       PT LONG TERM GOAL #1   Title Pt will improve BERG by at least 5 points in order to demonstrate clinically significant improvement in balance.    Baseline 01/27/2021= 43/56; 03/18/2021=50/56    Time 12    Period Weeks    Status Achieved      PT LONG TERM GOAL #2   Title Pt will improve FOTO to target score of 55% to display perceived improvements in ability to complete ADL's.    Baseline 01/27/2021= 42%; 04/22/2021- Will assess next visit; 05/06/2021= 64%    Time 12    Period Weeks    Status Achieved      PT LONG TERM GOAL #3   Title Pt will decrease 5TSTS by at least 3 seconds in order to demonstrate clinically significant improvement in LE strength.    Baseline 01/27/2021= 30.07 without UE support; 03/18/2021= 24.78 sec without UE Support; 04/22/2021= 14.47 sec without UE support; 07/15/2021- 17.43 sec without UE support    Time 12    Period Weeks    Status Achieved      PT LONG TERM GOAL #4   Title Pt will decrease TUG to below 15  seconds/decrease in order to demonstrate decreased fall risk.    Baseline 01/27/2021= 20.97 without an AD; 03/18/2021= 19.2 sec without AD; 04/22/2021= 17 sec without AD; 05/13/2021= 14.88 sec. 07/15/2021= 14.7 sec avg without AD    Time 12     Period Weeks    Status Achieved    Target Date 07/15/21      PT LONG TERM GOAL #5   Title Pt will increase 10MWT by at least 0.13 m/s in order to demonstrate clinically significant improvement in community ambulation.    Baseline 01/27/2021= 0.67 m/s without an AD.  9/14= 0.75 m/s; 04/22/2021= 0.82 sec without an AD- WIll keep goal active to ensure patient can perform consistently. 05/13/2021= 0.89 m/s. 07/15/2021= 0.98 m/s    Time 12    Period Weeks    Status Achieved    Target Date 07/15/21      PT LONG TERM GOAL #6   Title Pt will increase 6MWT by at least 41m(1666f in order to demonstrate clinically significant improvement in cardiopulmonary endurance and community ambulation    Baseline 02/03/2021= 1100 feet; 03/18/21= 1125 feet without AD.  04/22/2021= 1175 feet without AD 07/15/2021= 1210 feet without an AD; 08/05/2021= 1190 feet without AD; 10/06/2021= 1250 feet    Time 11    Period Weeks    Status On-going    Target Date 12/29/21      PT LONG TERM GOAL #7   Title Pt will increase 10MWT to 1.0 m/s or greater in order to demonstrate clinically significant improvement in community ambulation.    Baseline 07/15/2021= 0.98 m/s; 10/06/2021= 1.1 m/s    Time 12    Period Weeks    Status Achieved    Target Date 10/07/21      PT LONG TERM GOAL #8   Title Patient will demonstrate improved flexibility in Cervical Rot and hamstring flexibility for improved functional independence in home and improved quality of life.    Baseline 10/06/2021=Hamstring length= 48 deg L; 55 deg R and Cervical Rotation- L=22/R=28 deg; 10/28/2021= Rot= L/R= 25/30    Time 12    Period Weeks    Status New    Target Date 12/29/21  Plan - 12/16/21 0915     Clinical Impression Statement Patient tolerated physical therapy session well with intermittent pain. He does have significant improvement of cervical ROM by end of session. Education on need for compliance with HEP. Patient does have  multiple trigger point releases, discussed return to main therapist schedule with return to author as needed. Pt will continue to benefit from skilled PT intervention in order to improve his strength, balance, mobility and QOL    Personal Factors and Comorbidities Comorbidity 3+    Comorbidities Anoxic brain injury (1999), Cardiac (pacemaker/debrillator), HTN    Examination-Activity Limitations Bathing;Caring for Others;Lift;Stairs;Transfers    Examination-Participation Restrictions Community Activity;Yard Work    Stability/Clinical Decision Making Stable/Uncomplicated    Rehab Potential Good    PT Frequency 1x / week    PT Duration 12 weeks    PT Treatment/Interventions ADLs/Self Care Home Management;Cryotherapy;Moist Heat;DME Instruction;Gait training;Stair training;Functional mobility training;Therapeutic activities;Therapeutic exercise;Balance training;Neuromuscular re-education;Patient/family education;Manual techniques;Passive range of motion;Dry needling;Canalith Repostioning;Ultrasound;Vestibular;Joint Manipulations    PT Next Visit Plan Progress cervcial ROM/LE flexibility, dry needling as appropriate,  and  LE strengthening and continue with  balance exercises.    PT Home Exercise Plan No changes this visit    Consulted and Agree with Plan of Care Patient;Family member/caregiver    Family Member Consulted Wife- [name]             Patient will benefit from skilled therapeutic intervention in order to improve the following deficits and impairments:  Abnormal gait, Decreased activity tolerance, Decreased balance, Decreased cognition, Decreased coordination, Decreased endurance, Decreased mobility, Decreased strength, Difficulty walking  Visit Diagnosis: Abnormality of gait and mobility  Difficulty in walking, not elsewhere classified  Muscle weakness (generalized)     Problem List Patient Active Problem List   Diagnosis Date Noted   Neck pain 07/02/2021   Gait abnormality  12/16/2020   Nonintractable headache 09/30/2020   Tinnitus 09/09/2019   History of sudden cardiac arrest 01/09/2019   Viral syndrome 09/06/2018   Seizures ([place]) 03/02/2018   Syncope 12/26/2017   Other social stressor 12/26/2017   Health care maintenance 08/14/2017   Family hx of prostate cancer    Allergy    Insomnia 10/20/2015   Advance care planning 03/08/2014   Memory loss 09/14/2013   Hypoxic brain injury ([place]) 09/14/2013   Medicare annual wellness visit, subsequent 02/16/2013   Shortness of breath 08/10/2011   Implantable cardioverter-defibrillator (ICD) in situ 02/03/2009   Hypothyroidism 11/18/2008   CONTACT DERMATITIS&OTHER ECZEMA DUE TO PLANTS 02/03/2008   IDIOPATHIC URTICARIA 02/03/2008   Hypertension 02/03/2008   HLD (hyperlipidemia) 11/23/07   Attention deficit disorder 11/23/2007   PVC/VT non sustained 11-23-07   Sleep apnea 11/23/07   HEADACHE Nov 23, 2007   SUDDEN DEATH-aborted 11/23/07   LOW BACK PAIN SYNDROME 10/11/2007   Janna Arch, PT, DPT  12/16/2021, 9:16 AM  [place] [facility] MAIN [facility] SERVICES [place], [place], [address] Phone: [phone]   Fax:  [phone]  Name: ELIOTT AMPARAN MRN: [id number] Date of Birth: [birth year]

## 2021-12-16 NOTE — Telephone Encounter (Signed)
   Primary Cardiologist: Sherren Mocha, MD  Chart reviewed as part of pre-operative protocol coverage. Simple dental extractions are considered low risk procedures per guidelines and generally do not require any specific cardiac clearance. It is also generally accepted that for simple extractions and dental cleanings, there is no need to interrupt blood thinner therapy.   SBE prophylaxis is not required for the patient.  I will route this recommendation to the requesting party via Epic fax function and remove from pre-op pool.  Please call with questions.  Emmaline Life, NP-C    12/16/2021, 4:28 PM [place] [address], [address] Office [phone] Fax [phone]

## 2021-12-16 NOTE — Telephone Encounter (Signed)
   Pre-operative Risk Assessment    Patient Name: Larry [name]  DOB: 1950-11-22 MRN: [id number]     Request for Surgical Clearance    Procedure:   Dental Crown  Date of Surgery:  Clearance TBD                                 Surgeon:  Dr. Martinique Thomas Surgeon's Group or Practice Name:  [facility] Phone number:  [phone] Fax number:  [phone]   Type of Clearance Requested:   - Pharmacy:  Hold Aspirin (TBD by Cardiologist)   Type of Anesthesia:  Local    Additional requests/questions:  Please fax a copy of medical clearance to the surgeon's office.  [name]   12/16/2021, 3:03 PM

## 2021-12-21 ENCOUNTER — Encounter: Payer: Self-pay | Admitting: Family Medicine

## 2021-12-21 ENCOUNTER — Ambulatory Visit: Payer: Medicare Other | Admitting: Neurology

## 2021-12-21 ENCOUNTER — Encounter: Payer: Self-pay | Admitting: Neurology

## 2021-12-21 ENCOUNTER — Telehealth: Payer: Self-pay | Admitting: Neurology

## 2021-12-21 VITALS — BP 141/85 | HR 72 | Ht 69.0 in | Wt 185.0 lb

## 2021-12-21 DIAGNOSIS — G931 Anoxic brain damage, not elsewhere classified: Secondary | ICD-10-CM

## 2021-12-21 DIAGNOSIS — R569 Unspecified convulsions: Secondary | ICD-10-CM

## 2021-12-21 DIAGNOSIS — M542 Cervicalgia: Secondary | ICD-10-CM | POA: Diagnosis not present

## 2021-12-21 NOTE — Progress Notes (Signed)
HISTORY OF PRESENT ILLNESS:YYMr. Larry [name] is a very friendly 71 year old right-handed gentleman presents for followup consultation of his memory loss in the context of anoxic brain injury.    He is accompanied by his wife today. He was a patient of Dr. Erling Cruz, The patient has an underlying medical history of cardiac death from ventricular fibrillation with hypoxic brain injury in Jan 1999 due to arrythmia, 15 minutes without heart beat,  status post defibrillator placement, He stayed in hospital for 35 days, required prolonged rehabilitation, has to relearn walking again, significant amnesia, "as if that he did not have a past".   EEG showed diffuse slowing in January 1999, CT head and brain MRI were normal. CT head repeat in November 2000 showed slight prominence of the lateral and third ventricles. CT head in July 2003 showed prominence of the ventricular system. Psychological testing and August 1999 showed borderline performance IQ. He became depressed. In January 2014 his MMSE was 29, clock drawing was 4, animal fluency was 10.  He went to [facility] for 2 years for patient with degenerative disorder.   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 He lives at home with his wife, still driving.   He helps the household, he still has difficulty focusing.    He has had some fluctuation in his memory per wife. He has ADD and continues to take Adderall, 10 mg twice a day, without Adderall, he could not remember  the pages that he has read,he is also taking Aricept 10 mg every day, which has been very helpful too.   Up date April 14 2016YY   His overall doing very well, he likes play computer games, which has helped his eye and hands coordination, he had recent trip to [place], felt overwhelming, he continued to drive short distance,   He is taking Ritalin 10 mg twice a day, Aricept 10 mg once a day, complains of insomnia, he took  his last dose of Ritalin at evening time.   UPDATE Apr 21 2015:YY He is now taking Namenda 38m bid and aricept 17mqday, he still has trouble sleeping,  He watch movie, stay up late, he continues to have focus difficulty, tends to become agitated.   UPDATE April 27th 2017: YY He still has chronic insomnia, tends to have irregular sleep pattern, stay up late watching TV, he still very active during the day, take Adderall 10 mg twice a day, still drives short distance, mild unsteady gait, tends to hold his left arm to his chest   UPDATE Oct 17th 2017:YY He is with his wife,  Her daughter was hospital, presented with sudden loss of consciousness yesterday April 19 2016, which has brought back a lot of his memory, he has not tried clonazepam for chronic insomnia, worry about side effect, tried melatonin, still has significant insomnia, he is taking Tylenol pm,    UPDATE March 02 2018: He was follow-up by [facility] 2018, on December 19, 2017, [place], he had sudden onset dizziness, feels like going to pass out, then had transient loss of consciousness, eyes rolled back, staring, lasting for 1 minutes, followed by postevent mild confusion and slurred speech last about 40 minutes, was treated at local hospital.   I personally reviewed CT head without contrast: There was no acute abnormality, generalized atrophy, ventriculomegaly,  laboratory evaluation, EKG showed no significant abnormality.  Similar spells in March 2019, transient dizziness followed by sudden loss of muscle tone, consciousness, extreme fatigue, slept for 1 hour afterwards,   Patient reported that he actually has frequent spells about once a month, he has transient confusion, feels like he is going to pass out, short lasting,   UPDATE Jun 05 2018: He tolerated lamotrigine very well, taking 100 mg twice a day without significant side effect,   EEG was normal.  Since lamotrigine, he no  longer has recurrent spells of rising sensation transient confusion even though there was no clinical seizure activity observed during those spells, he had those spells about once a week prior to lamotrigine,  UPDATE Jun 19 2020: Is accompanied by his wife at today's clinical visit, slow worsening memory loss, when he goes to a different room, he often forgets why is he there, slight worsening gait abnormality, he also complains of worsening tinnitus, difficulty sleeping sometimes,  I personally reviewed CT temporal bone w/wo in April 2021:  1. Negative CT appearance of the bilateral temporal bones. No explanation for hearing loss. 2. Note a mildly high riding right jugular bulb, with thin but intact overlying bony covering. 3. Hyperplastic but clear visible paranasal sinuses.  UPDATE March 29th 2022: He used to have headache even before his cardiac arrest, only occasionally, mild, his 2 daughter does has typical migraine headache,  Over the years, he has occasionally headache usually short lasting, on March 26, he complains of transient sharp pain shooting from the left parietal region downward, lasting for few seconds, then had a trickle of pressure headache, take ibuprofen, which did improve  Next morning March 27 Sunday, while at church, at the end of the sermon, he was noted to holding his left side, he also noticed transient blurry vision, was brought to the emergency room,  I personally reviewed CT head with contrast, no acute intracranial abnormality, no venous sinus thrombosis, arterial branch that was visualized showed no significant abnormality  Laboratory showed normal ESR, negative troponin, normal CBC, BMP with exception of elevated glucose 140, was seen by ophthalmology today, no significant abnormality found, was told he has cataract, left side was slightly worse than the right  Update June 14th 2022: I reviewed his headache diary, few episode in March, transient sharp, only 1  episode on October 19, 2020, no longer has frequent headaches, reported worsening vision bilaterally, left worse than right, on today's near chart examination, visual acuity was 20/50 OS/OD,  Also complains of worsening gait abnormality, wife noticed worsening memory loss,  We personally reviewed CT of temporal bone with without contrast in April 2021 for evaluation of his reported hearing loss, no acute abnormality, CT venogram of brain on September 28, 2020 that was normal  Update July 02, 2021: Is accompanied by his wife, continued slow worsening, increased memory loss, slow reaction time, continue to drive, wife has to be vigilant sitting at the passenger side, MoCA examination 22/30 today,  Over the past few months, he had few episode of transient ice pricking pain, not long-lasting, He also complains of back pain, rigidity, decreased range of motion  UPDATE December 21 2021: He continues to have slowing memory loss, MoCA examination 25/30, actually improved from previous memory of 22/30, he has slower reaction time, take time to figure out things,  He did have a history of obstructive sleep apnea, tried different facial mask, including nasal cannula in the past, could not tolerate it,  He also complains of worsening neck pain, stiffness, limited  range of motion of his neck, somewhat improvement by physical therapy and dry needling, he denies bowel and bladder incontinence  Personally reviewed x-ray of cervical spine, mild to moderate multilevel cervical degenerative disc disease worse at C6-7   REVIEW OF SYSTEMS: Out of a complete 14 system review of symptoms, the patient complains only of the following symptoms, and all other reviewed systems are negative.  Memory loss, seizures  ALLERGIES: Allergies  Allergen Reactions   Penicillins     Difficult Breathing, rash    HOME MEDICATIONS: Outpatient Medications Prior to Visit  Medication Sig Dispense Refill    amphetamine-dextroamphetamine (ADDERALL) 10 MG tablet TAKE ONE TABLET BY MOUTH TWICE A DAY 180 tablet 0   Ascorbic Acid (VITAMIN C) 500 MG tablet Take 500 mg by mouth daily.     aspirin 81 MG tablet Take 81 mg by mouth daily.     Cholecalciferol (VITAMIN D3) 10 MCG (400 UNIT) tablet Take 400 Units by mouth daily.     diltiazem (CARDIZEM CD) 120 MG 24 hr capsule TAKE 1 CAPSULE BY MOUTH  DAILY 90 capsule 3   donepezil (ARICEPT) 10 MG tablet TAKE 1 TABLET BY MOUTH  DAILY 90 tablet 3   fexofenadine (ALLEGRA) 180 MG tablet Take 180 mg by mouth daily.     Ginkgo Biloba 40 MG TABS Take by mouth as directed.     lamoTRIgine (LAMICTAL) 100 MG tablet TAKE 1 TABLET BY MOUTH  TWICE DAILY 180 tablet 3   levothyroxine (SYNTHROID) 137 MCG tablet Take 1 tablet (137 mcg total) by mouth daily before breakfast. 90 tablet 3   Melatonin 5 MG CAPS Take by mouth as directed.     memantine (NAMENDA) 10 MG tablet TAKE 1 TABLET BY MOUTH  TWICE DAILY 180 tablet 3   metoprolol succinate (TOPROL-XL) 25 MG 24 hr tablet TAKE 1 TABLET BY MOUTH  DAILY 90 tablet 3   montelukast (SINGULAIR) 10 MG tablet Take 1 tablet (10 mg total) by mouth daily. 90 tablet 3   Multiple Vitamin (MULTIVITAMIN) tablet Take 700 tablets by mouth daily.     NON FORMULARY Focus factor 1 tab po bid     rosuvastatin (CRESTOR) 20 MG tablet Take 1 tablet (20 mg total) by mouth at bedtime. 90 tablet 3   vitamin B-12 (CYANOCOBALAMIN) 1000 MCG tablet Take 1,000 mcg by mouth daily.     vitamin E 180 MG (400 UNITS) capsule Take 400 Units by mouth daily.     No facility-administered medications prior to visit.    PAST MEDICAL HISTORY: Past Medical History:  Diagnosis Date   ADD (attention deficit disorder)    Allergy    seasonal flares of perinneal allergies   Cancer ([place])    melanoma   Cardiac arrest ([place]) 07/1997   aborted   Dementia ([place])    post resusitative   Depression    Diverticulosis    Family hx of prostate cancer    GERD  (gastroesophageal reflux disease)    Headache(784.0)    Hyperlipidemia    Hypothyroidism    ICD (implantable cardiac defibrillator) in place    Idiopathic urticaria    Internal hemorrhoids    Low back pain syndrome    Melanoma in situ of skin of trunk (HCC)    chest   PVC (premature ventricular contraction)    associated with cardiac arrest   Sleep apnea    no c-pap   Tubular adenoma of colon 10/2010    PAST SURGICAL HISTORY: Past  Surgical History:  Procedure Laterality Date    implantation x2     CARDIAC DEFIBRILLATOR PLACEMENT  07/01/2010   Explanation of a previously implanted device, pocket revision, and insertion of a new device, and intraoperative defibrillation threshold testing Caryl Comes)   CARDIAC DEFIBRILLATOR REMOVAL  2004   Defibrillator change Caryl Comes) 3 times changed   CATARACT EXTRACTION W/PHACO Left 02/04/2021   Procedure: CATARACT EXTRACTION PHACO AND INTRAOCULAR LENS PLACEMENT ([place]) LEFT VIVITY toric LENS 9.21 01:11.6;  Surgeon: Leandrew Koyanagi, MD;  Location: [place];  Service: Ophthalmology;  Laterality: Left;  keep patient at 11:30 arrival   CATARACT EXTRACTION W/PHACO Right 03/04/2021   Procedure: CATARACT EXTRACTION PHACO AND INTRAOCULAR LENS PLACEMENT ([place]) RIGHT VIVITY LENS 8.59 01:15.3;  Surgeon: Leandrew Koyanagi, MD;  Location: [place];  Service: Ophthalmology;  Laterality: Right;   HEMORRHOID SURGERY     ICD     TONSILLECTOMY      FAMILY HISTORY: Family History  Problem Relation Age of Onset   Hypertension Mother    Heart disease Father    Diverticulosis Father    Coronary artery disease Father    Alopecia Father    Prostate cancer Brother    Lung cancer Paternal Grandfather        Smoker   Cancer Paternal Grandfather        lung cancer - smoker   Diabetes Brother    Heart disease Brother        heart transplant   Colon cancer Neg Hx     SOCIAL HISTORY: Social History   Socioeconomic History   Marital  status: Married    Spouse name: [name]   Number of children: 2   Years of education: College   Highest education level: Not on file  Occupational History    Employer: DISABLED  Tobacco Use   Smoking status: Former    Packs/day: 1.00    Years: 10.00    Total pack years: 10.00    Types: Cigarettes    Quit date: 07/06/1995    Years since quitting: 26.4   Smokeless tobacco: Former    Types: Chew    Quit date: 11/04/1994  Vaping Use   Vaping Use: Never used  Substance and Sexual Activity   Alcohol use: No    Alcohol/week: 0.0 standard drinks of alcohol    Comment: socially   Drug use: No   Sexual activity: Not on file  Other Topics Concern   Not on file  Social History Narrative   Patient is married [name]) [phone] with 2 daughters   5 grandchildren   Daily caffeine use - one cup of coffee and one-two cokes per day   Patient is right-handed.   Patient has a college education   Social Determinants of Radio broadcast assistant Strain: Low Risk  (10/05/2021)   Overall Financial Resource Strain (CARDIA)    Difficulty of Paying Living Expenses: Not hard at all  Food Insecurity: No Food Insecurity (10/05/2021)   Hunger Vital Sign    Worried About Running Out of Food in the Last Year: Never true    Ran Out of Food in the Last Year: Never true  Transportation Needs: No Transportation Needs (10/05/2021)   PRAPARE - Hydrologist (Medical): No    Lack of Transportation (Non-Medical): No  Physical Activity: Insufficiently Active (10/05/2021)   Exercise Vital Sign    Days of Exercise per Week: 4 days    Minutes of Exercise per  Session: 30 min  Stress: No Stress Concern Present (10/05/2021)   [place]    Feeling of Stress : Not at all  Social Connections: [place] (10/05/2021)   Social Connection and Isolation Panel [NHANES]    Frequency of Communication with Friends and Family: More than  three times a week    Frequency of Social Gatherings with Friends and Family: More than three times a week    Attends Religious Services: More than 4 times per year    Active Member of [employer] or Organizations: Yes    Attends Music therapist: More than 4 times per year    Marital Status: Married  Human resources officer Violence: Not At Risk (10/05/2021)   Humiliation, Afraid, Rape, and Kick questionnaire    Fear of Current or Ex-Partner: No    Emotionally Abused: No    Physically Abused: No    Sexually Abused: No   PHYSICAL EXAM  Vitals:   12/21/21 1253  BP: (!) 141/85  Pulse: 72  Weight: 185 lb (83.9 kg)  Height: 5' 9"  (1.753 m)   Body mass index is 27.32 kg/m.  Generalized: Well developed, in no acute distress     12/21/2021   12:56 PM 07/02/2021   10:30 AM  Montreal Cognitive Assessment   Visuospatial/ Executive (0/5) 4 3  Naming (0/3) 3 3  Attention: Read list of digits (0/2) 2 2  Attention: Read list of letters (0/1) 1 1  Attention: Serial 7 subtraction starting at 100 (0/3) 3 2  Language: Repeat phrase (0/2) 1 1  Language : Fluency (0/1) 1 1  Abstraction (0/2) 2 2  Delayed Recall (0/5) 3 2  Orientation (0/6) 5 5  Total 25 22     NEUROLOGICAL EXAM:  Neck: Limited range of motion,  MENTAL STATUS: Speech/Cognition: Slow reaction time time, cooperative on examination  CRANIAL NERVES: CN II: Visual fields are full to confrontation.  Pupils are small, reactive CN III, IV, VI: extraocular movement are normal. No ptosis. CN V: Facial sensation is intact to light touch. CN VII: Face is symmetric with normal eye closure and smile. CN VIII: Hearing is normal to casual conversation CN IX, X: Palate elevates symmetrically. Phonation is normal. CN XI: Head turning and shoulder shrug are intact  MOTOR: Fixation left upper extremity on rapid rotating movement  REFLEXES: Reflexes are 2  and symmetric at the biceps, triceps, knees and ankles. Plantar responses  are flexor.  SENSORY: Intact to light touch, pinprick, positional and vibratory sensation at fingers and toes.  COORDINATION: There is no trunk or limb ataxia.    GAIT/STANCE: He needs push-up to get up from seated position, tends to hold his left arm in elbow flexion, mildly wide-based, unsteady  DIAGNOSTIC DATA (LABS, IMAGING, TESTING) - I reviewed patient records, labs, notes, testing and imaging myself where available.  Lab Results  Component Value Date   WBC 7.5 10/27/2021   HGB 14.1 10/27/2021   HCT 42.0 10/27/2021   MCV 93.0 10/27/2021   PLT 348.0 10/27/2021      Component Value Date/Time   NA 137 10/27/2021 0807   NA 140 06/12/2019 1015   K 4.2 10/27/2021 0807   CL 101 10/27/2021 0807   CO2 30 10/27/2021 0807   GLUCOSE 94 10/27/2021 0807   BUN 12 10/27/2021 0807   BUN 13 06/12/2019 1015   CREATININE 0.93 10/27/2021 0807   CREATININE 1.07 12/23/2017 1451   CALCIUM 9.5 10/27/2021  0807   PROT 6.9 10/27/2021 0807   PROT 6.6 06/12/2019 1015   ALBUMIN 4.5 10/27/2021 0807   ALBUMIN 4.4 06/12/2019 1015   AST 24 10/27/2021 0807   ALT 30 10/27/2021 0807   ALKPHOS 80 10/27/2021 0807   BILITOT 0.5 10/27/2021 0807   BILITOT 0.3 06/12/2019 1015   GFRNONAA >60 09/28/2020 1534   GFRAA 101 06/12/2019 1015   Lab Results  Component Value Date   CHOL 186 10/27/2021   HDL 59.00 10/27/2021   LDLCALC 106 (H) 10/27/2021   LDLDIRECT 127.6 03/05/2014   TRIG 101.0 10/27/2021   CHOLHDL 3 10/27/2021   Lab Results  Component Value Date   HGBA1C 6.2 10/27/2021   No results found for: "VITAMINB12" Lab Results  Component Value Date   TSH 1.66 10/27/2021      ASSESSMENT AND PLAN 71 y.o. year old male    Anoxic brain injury in 1999 due to prolonged cardiac arrest,  With residual mild cognitive impairment  Slow worsening over the years  Continue Aricept 10 mg daily, Namenda 10 mg twice a day  Encouraged moderate exercise  We also discussed intermittent about his  driving privilege,he does have slow reaction time, I have limit his driving to short distance daytime only,  Seizure  Most recent in June 2019, EEG was normal  Continue lamotrigine 100 mg twice a day  Gait abnormality  Related to the previous anoxic brain injury, deconditioning, neck pain  Worsening neck pain, limited range of motion  X-ray of cervical spine showed degenerative changes, with his gradual worsening neck pain, limited range of motion, he desires pain management evaluation,  Refer to [place] [facility], not MRI candidate, will proceed with CT cervical spine    Marcial Pacas, M.D. Ph.D.  [facility] [place], [place] [address] Phone: [phone] Fax:      [phone]

## 2021-12-21 NOTE — Telephone Encounter (Signed)
[employer] medicare NPR sent to GI 

## 2021-12-22 ENCOUNTER — Telehealth: Payer: Self-pay | Admitting: Neurology

## 2021-12-22 NOTE — Telephone Encounter (Signed)
Referral for Pain Clinic sent to [facility] [phone]. 

## 2021-12-23 ENCOUNTER — Ambulatory Visit: Payer: Medicare Other

## 2021-12-23 DIAGNOSIS — M542 Cervicalgia: Secondary | ICD-10-CM | POA: Diagnosis not present

## 2021-12-23 DIAGNOSIS — R278 Other lack of coordination: Secondary | ICD-10-CM

## 2021-12-23 DIAGNOSIS — M6281 Muscle weakness (generalized): Secondary | ICD-10-CM

## 2021-12-23 DIAGNOSIS — R262 Difficulty in walking, not elsewhere classified: Secondary | ICD-10-CM | POA: Diagnosis not present

## 2021-12-23 DIAGNOSIS — R269 Unspecified abnormalities of gait and mobility: Secondary | ICD-10-CM | POA: Diagnosis not present

## 2021-12-23 DIAGNOSIS — R296 Repeated falls: Secondary | ICD-10-CM | POA: Diagnosis not present

## 2021-12-23 DIAGNOSIS — R2689 Other abnormalities of gait and mobility: Secondary | ICD-10-CM | POA: Diagnosis not present

## 2021-12-23 DIAGNOSIS — R2681 Unsteadiness on feet: Secondary | ICD-10-CM

## 2021-12-23 NOTE — Therapy (Signed)
OUTPATIENT PHYSICAL THERAPY TREATMENT NOTE   Patient Name: Larry [name] MRN: [id number] DOB:Nov 30, 1950, 71 y.o., male Today's Date: 12/23/2021  PCP: Dr. Elsie Stain REFERRING PROVIDER: Dr. Marcial Pacas   PT End of Session - 12/23/21 0858     Visit Number 48    Number of Visits 1    Date for PT Re-Evaluation 12/29/21    Authorization Type [employer] Medicare    Authorization Time Period 01/27/2021- 62/70/3500; Recert 93/81/8299- 37/16/9678; 07/15/2021-10/07/2021; 10/06/2021- 12/29/2021    Progress Note Due on Visit 8    PT Start Time 0845    PT Stop Time 0928    PT Time Calculation (min) 43 min    Equipment Utilized During Treatment Gait belt    Activity Tolerance Patient tolerated treatment well    Behavior During Therapy [facility] for tasks assessed/performed             Past Medical History:  Diagnosis Date   ADD (attention deficit disorder)    Allergy    seasonal flares of perinneal allergies   Cancer ([place])    melanoma   Cardiac arrest ([place]) 07/1997   aborted   Dementia ([place])    post resusitative   Depression    Diverticulosis    Family hx of prostate cancer    GERD (gastroesophageal reflux disease)    Headache(784.0)    Hyperlipidemia    Hypothyroidism    ICD (implantable cardiac defibrillator) in place    Idiopathic urticaria    Internal hemorrhoids    Low back pain syndrome    Melanoma in situ of skin of trunk ([place])    chest   PVC (premature ventricular contraction)    associated with cardiac arrest   Sleep apnea    no c-pap   Tubular adenoma of colon 10/2010   Past Surgical History:  Procedure Laterality Date    implantation x2     CARDIAC DEFIBRILLATOR PLACEMENT  07/01/2010   Explanation of a previously implanted device, pocket revision, and insertion of a new device, and intraoperative defibrillation threshold testing Caryl Comes)   CARDIAC DEFIBRILLATOR REMOVAL  2004   Defibrillator change Caryl Comes) 3 times changed   CATARACT EXTRACTION W/PHACO Left 02/04/2021    Procedure: CATARACT EXTRACTION PHACO AND INTRAOCULAR LENS PLACEMENT ([place]) LEFT VIVITY toric LENS 9.21 01:11.6;  Surgeon: Leandrew Koyanagi, MD;  Location: [place];  Service: Ophthalmology;  Laterality: Left;  keep patient at 11:30 arrival   CATARACT EXTRACTION W/PHACO Right 03/04/2021   Procedure: CATARACT EXTRACTION PHACO AND INTRAOCULAR LENS PLACEMENT ([place]) RIGHT VIVITY LENS 8.59 01:15.3;  Surgeon: Leandrew Koyanagi, MD;  Location: [place];  Service: Ophthalmology;  Laterality: Right;   HEMORRHOID SURGERY     ICD     TONSILLECTOMY     Patient Active Problem List   Diagnosis Date Noted   Neck pain 07/02/2021   Gait abnormality 12/16/2020   Nonintractable headache 09/30/2020   Tinnitus 09/09/2019   History of sudden cardiac arrest 01/09/2019   Viral syndrome 09/06/2018   Seizures ([place]) 03/02/2018   Syncope 12/26/2017   Other social stressor 12/26/2017   Health care maintenance 08/14/2017   Family hx of prostate cancer    Allergy    Insomnia 10/20/2015   Advance care planning 03/08/2014   Memory loss 09/14/2013   Hypoxic brain injury ([place]) 09/14/2013   Medicare annual wellness visit, subsequent 02/16/2013   Shortness of breath 08/10/2011   Implantable cardioverter-defibrillator (ICD) in situ 02/03/2009   Hypothyroidism 11/18/2008   CONTACT DERMATITIS&OTHER ECZEMA  DUE TO PLANTS 02/03/2008   IDIOPATHIC URTICARIA 02/03/2008   Hypertension 02/03/2008   HLD (hyperlipidemia) 11/03/2007   Attention deficit disorder Nov 03, 2007   PVC/VT non sustained 11-03-07   Sleep apnea 2007-11-03   HEADACHE 11/03/07   SUDDEN DEATH-aborted Nov 03, 2007   LOW BACK PAIN SYNDROME 10/11/2007    REFERRING DIAG: Hypoxic Brain Injury  THERAPY DIAG:  Abnormality of gait and mobility  Difficulty in walking, not elsewhere classified  Muscle weakness (generalized)  Cervicalgia  Other abnormalities of gait and mobility  Other lack of coordination  Unsteadiness on  feet  Repeated falls  Rationale for Evaluation and Treatment Rehabilitation  PERTINENT HISTORY: Per note submitted by Dr. Krista Blue on 12/15/2020- The patient has an underlying medical history of cardiac death from ventricular fibrillation with hypoxic brain injury in Jan 1999 due to arrythmia, 15 minutes without heart beat,  status post defibrillator placement, He stayed in hospital for 35 days. On 12/16/2020-Gait abnormality, Also related to the previous anoxic brain injury,   PRECAUTIONS: Fall  SUBJECTIVE: Patient reports he was seen by his neurologist and now [facility]  PAIN: PAIN:  Are you having pain? Yes:   PAIN:  Are you having pain? Yes: NPRS scale: 4/10 Pain location: posterior neck Pain description: ache/tightness Aggravating factors: moving my head Relieving factors: rest    TODAY'S TREATMENT:  12/23/2021  Manual therapy:  heat pad under neck and upper traps   Suboccipital release 5x 30 seconds Cervical rotation with overpressure at glenohumeral joint and occiput 3x 30 seconds each side Cervical Side bending with overpressure at end range- hold 30 sec 4 each side Grade II PA mobilizations to cervical and thoracic spine x 5 minutes   Therapeutic Exercises:    Scapular retraction with BTB 2 sets of 10 reps Towel under neck, distraction with cervical extension 5x x3 second holds   Seated cervical rotation 10x each side  Supine chin retraction with 5 sec hold x 10 reps   Education provided throughout session via VC/TC and demonstration to facilitate movement at target joints and correct muscle activation for all testing and exercises performed.       PATIENT EDUCATION: Education details: Exercise technique Person educated: Patient Education method: Explanation, Demonstration, Tactile cues, and Verbal cues Education comprehension: verbalized understanding, returned demonstration, verbal cues required, tactile cues  required, and needs further education   HOME EXERCISE PROGRAM: No updates today   PT Short Term Goals -       PT SHORT TERM GOAL #1   Title Pt will be independent with HEP in order to improve strength and balance in order to decrease fall risk and improve function at home and work.    Baseline 01/27/2021- Patient presents with no formal HEP in place. 03/18/2021- Patient is knowledgeable of HEP- WIfe reports he is not always compliant but not because I don't know what to do."    Time 6    Period Weeks    Status Achieved    Target Date 03/10/21              PT Long Term Goals -       PT LONG TERM GOAL #1   Title Pt will improve BERG by at least 5 points in order to demonstrate clinically significant improvement in balance.    Baseline 01/27/2021= 43/56; 03/18/2021=50/56    Time 12    Period Weeks    Status Achieved      PT LONG TERM  GOAL #2   Title Pt will improve FOTO to target score of 55% to display perceived improvements in ability to complete ADL's.    Baseline 01/27/2021= 42%; 04/22/2021- Will assess next visit; 05/06/2021= 64%    Time 12    Period Weeks    Status Achieved      PT LONG TERM GOAL #3   Title Pt will decrease 5TSTS by at least 3 seconds in order to demonstrate clinically significant improvement in LE strength.    Baseline 01/27/2021= 30.07 without UE support; 03/18/2021= 24.78 sec without UE Support; 04/22/2021= 14.47 sec without UE support; 07/15/2021- 17.43 sec without UE support    Time 12    Period Weeks    Status Achieved      PT LONG TERM GOAL #4   Title Pt will decrease TUG to below 15  seconds/decrease in order to demonstrate decreased fall risk.    Baseline 01/27/2021= 20.97 without an AD; 03/18/2021= 19.2 sec without AD; 04/22/2021= 17 sec without AD; 05/13/2021= 14.88 sec. 07/15/2021= 14.7 sec avg without AD    Time 12    Period Weeks    Status Achieved    Target Date 07/15/21      PT LONG TERM GOAL #5   Title Pt will increase 10MWT by at least  0.13 m/s in order to demonstrate clinically significant improvement in community ambulation.    Baseline 01/27/2021= 0.67 m/s without an AD.  9/14= 0.75 m/s; 04/22/2021= 0.82 sec without an AD- WIll keep goal active to ensure patient can perform consistently. 05/13/2021= 0.89 m/s. 07/15/2021= 0.98 m/s    Time 12    Period Weeks    Status Achieved    Target Date 07/15/21      PT LONG TERM GOAL #6   Title Pt will increase 6MWT by at least 83m(1662f in order to demonstrate clinically significant improvement in cardiopulmonary endurance and community ambulation    Baseline 02/03/2021= 1100 feet; 03/18/21= 1125 feet without AD.  04/22/2021= 1175 feet without AD 07/15/2021= 1210 feet without an AD; 08/05/2021= 1190 feet without AD; 10/06/2021= 1250 feet    Time 11    Period Weeks    Status On-going    Target Date 12/29/21      PT LONG TERM GOAL #7   Title Pt will increase 10MWT to 1.0 m/s or greater in order to demonstrate clinically significant improvement in community ambulation.    Baseline 07/15/2021= 0.98 m/s; 10/06/2021= 1.1 m/s    Time 12    Period Weeks    Status Achieved    Target Date 10/07/21      PT LONG TERM GOAL #8   Title Patient will demonstrate improved flexibility in Cervical Rot and hamstring flexibility for improved functional independence in home and improved quality of life.    Baseline 10/06/2021=Hamstring length= 48 deg L; 55 deg R and Cervical Rotation- L=22/R=28 deg; 10/28/2021= Rot= L/R= 25/30    Time 12    Period Weeks    Status New    Target Date 12/29/21              Plan - 12/23/21 0901     Clinical Impression Statement Patient presents with continued stiffness yet improved pain relief -down to 1/10 after treatment with reported improved ROM. He continues to require VC for ROM techniques and has memory issues so will benefit from Caregiver reminders to perform his exercises. Pt will continue to benefit from skilled PT intervention in order to improve his strength,  balance, mobility and QOL    Personal Factors and Comorbidities Comorbidity 3+    Comorbidities Anoxic brain injury (1999), Cardiac (pacemaker/debrillator), HTN    Examination-Activity Limitations Bathing;Caring for Others;Lift;Stairs;Transfers    Examination-Participation Restrictions Community Activity;Yard Work    Stability/Clinical Decision Making Stable/Uncomplicated    Rehab Potential Good    PT Frequency 1x / week    PT Duration 12 weeks    PT Treatment/Interventions ADLs/Self Care Home Management;Cryotherapy;Moist Heat;DME Instruction;Gait training;Stair training;Functional mobility training;Therapeutic activities;Therapeutic exercise;Balance training;Neuromuscular re-education;Patient/family education;Manual techniques;Passive range of motion;Dry needling;Canalith Repostioning;Ultrasound;Vestibular;Joint Manipulations    PT Next Visit Plan Progress cervcial ROM/LE flexibility, dry needling as appropriate,  and  LE strengthening and continue with  balance exercises.    PT Home Exercise Plan No changes this visit    Consulted and Agree with Plan of Care Patient;Family member/caregiver    Family Member Consulted Wife- Anola Gurney, PT 12/23/2021, 2:13 PM

## 2021-12-25 ENCOUNTER — Telehealth: Payer: Self-pay

## 2021-12-25 NOTE — Telephone Encounter (Signed)
Last seen 11/05/2021 Last refilled 10/04/21

## 2021-12-27 MED ORDER — AMPHETAMINE-DEXTROAMPHETAMINE 10 MG PO TABS: 10.0000 mg | ORAL_TABLET | Freq: Two times a day (BID) | ORAL | 0 refills | Status: DC

## 2021-12-27 NOTE — Telephone Encounter (Addendum)
Sent. Thanks.  Please have him update [place] if he has trouble getting it filled.

## 2021-12-28 ENCOUNTER — Ambulatory Visit: Payer: Medicare Other | Admitting: Neurology

## 2021-12-28 ENCOUNTER — Ambulatory Visit
Admission: RE | Admit: 2021-12-28 | Discharge: 2021-12-28 | Disposition: A | Discharge status: Home / Self Care | Payer: Medicare Other | Source: Ambulatory Visit | Attending: Neurology | Admitting: Neurology

## 2021-12-28 DIAGNOSIS — M542 Cervicalgia: Secondary | ICD-10-CM

## 2021-12-30 ENCOUNTER — Ambulatory Visit: Payer: Medicare Other

## 2022-01-08 ENCOUNTER — Ambulatory Visit (INDEPENDENT_AMBULATORY_CARE_PROVIDER_SITE_OTHER): Payer: Medicare Other

## 2022-01-08 DIAGNOSIS — Z8674 Personal history of sudden cardiac arrest: Secondary | ICD-10-CM

## 2022-01-11 DIAGNOSIS — M47812 Spondylosis without myelopathy or radiculopathy, cervical region: Secondary | ICD-10-CM | POA: Diagnosis not present

## 2022-01-11 DIAGNOSIS — M542 Cervicalgia: Secondary | ICD-10-CM | POA: Diagnosis not present

## 2022-01-11 DIAGNOSIS — G8929 Other chronic pain: Secondary | ICD-10-CM | POA: Diagnosis not present

## 2022-01-12 LAB — CUP PACEART REMOTE DEVICE CHECK
Battery Remaining Longevity: 24 mo
Battery Remaining Percentage: 28 %
Brady Statistic RV Percent Paced: 1 %
Date Time Interrogation Session: 20230711041100
HighPow Impedance: 67 Ohm
Implantable Lead Implant Date: 19990204
Implantable Lead Location: 753860
Implantable Lead Model: 135
Implantable Lead Serial Number: 301213
Implantable Pulse Generator Implant Date: 20111228
Lead Channel Impedance Value: 821 Ohm
Lead Channel Pacing Threshold Amplitude: 0.9 V
Lead Channel Pacing Threshold Pulse Width: 1 ms
Lead Channel Setting Pacing Amplitude: 2.4 V
Lead Channel Setting Pacing Pulse Width: 1 ms
Lead Channel Setting Sensing Sensitivity: 0.4 mV
Pulse Gen Serial Number: 277857

## 2022-01-14 ENCOUNTER — Ambulatory Visit: Payer: Medicare Other | Attending: Neurology

## 2022-01-14 DIAGNOSIS — R262 Difficulty in walking, not elsewhere classified: Secondary | ICD-10-CM | POA: Insufficient documentation

## 2022-01-14 DIAGNOSIS — R269 Unspecified abnormalities of gait and mobility: Secondary | ICD-10-CM | POA: Diagnosis not present

## 2022-01-14 DIAGNOSIS — R278 Other lack of coordination: Secondary | ICD-10-CM | POA: Diagnosis not present

## 2022-01-14 DIAGNOSIS — M542 Cervicalgia: Secondary | ICD-10-CM | POA: Diagnosis not present

## 2022-01-14 DIAGNOSIS — R2689 Other abnormalities of gait and mobility: Secondary | ICD-10-CM | POA: Insufficient documentation

## 2022-01-14 DIAGNOSIS — M6281 Muscle weakness (generalized): Secondary | ICD-10-CM | POA: Diagnosis not present

## 2022-01-14 NOTE — Therapy (Signed)
OUTPATIENT PHYSICAL THERAPY TREATMENT NOTE/RECERTIFICATION for Dates 01/14/2022-04/08/2022   Patient Name: Larry [name] MRN: [id number] DOB:04-27-1951, 71 y.o., male Today's Date: 01/14/2022  PCP: Dr. Elsie Stain REFERRING PROVIDER: Dr. Marcial Pacas   PT End of Session - 01/14/22 0808     Visit Number 49    Number of Visits 12    Date for PT Re-Evaluation 04/08/22    Authorization Type [employer] Medicare    Authorization Time Period 01/27/2021- 70/62/3762; Recert 83/15/1761- 60/73/7106; 07/15/2021-10/07/2021; 10/06/2021- 12/29/2021; 01/14/2022-04/08/2022    Progress Note Due on Visit 57    PT Start Time 0804    PT Stop Time 2694    PT Time Calculation (min) 40 min    Equipment Utilized During Treatment Gait belt    Activity Tolerance Patient tolerated treatment well    Behavior During Therapy [facility] for tasks assessed/performed             Past Medical History:  Diagnosis Date   ADD (attention deficit disorder)    Allergy    seasonal flares of perinneal allergies   Cancer ([place])    melanoma   Cardiac arrest ([place]) 07/1997   aborted   Dementia ([place])    post resusitative   Depression    Diverticulosis    Family hx of prostate cancer    GERD (gastroesophageal reflux disease)    Headache(784.0)    Hyperlipidemia    Hypothyroidism    ICD (implantable cardiac defibrillator) in place    Idiopathic urticaria    Internal hemorrhoids    Low back pain syndrome    Melanoma in situ of skin of trunk ([place])    chest   PVC (premature ventricular contraction)    associated with cardiac arrest   Sleep apnea    no c-pap   Tubular adenoma of colon 10/2010   Past Surgical History:  Procedure Laterality Date    implantation x2     CARDIAC DEFIBRILLATOR PLACEMENT  07/01/2010   Explanation of a previously implanted device, pocket revision, and insertion of a new device, and intraoperative defibrillation threshold testing Caryl Comes)   CARDIAC DEFIBRILLATOR REMOVAL  2004   Defibrillator change  Caryl Comes) 3 times changed   CATARACT EXTRACTION W/PHACO Left 02/04/2021   Procedure: CATARACT EXTRACTION PHACO AND INTRAOCULAR LENS PLACEMENT ([place]) LEFT VIVITY toric LENS 9.21 01:11.6;  Surgeon: Leandrew Koyanagi, MD;  Location: [place];  Service: Ophthalmology;  Laterality: Left;  keep patient at 11:30 arrival   CATARACT EXTRACTION W/PHACO Right 03/04/2021   Procedure: CATARACT EXTRACTION PHACO AND INTRAOCULAR LENS PLACEMENT ([place]) RIGHT VIVITY LENS 8.59 01:15.3;  Surgeon: Leandrew Koyanagi, MD;  Location: [place];  Service: Ophthalmology;  Laterality: Right;   HEMORRHOID SURGERY     ICD     TONSILLECTOMY     Patient Active Problem List   Diagnosis Date Noted   Neck pain 07/02/2021   Gait abnormality 12/16/2020   Nonintractable headache 09/30/2020   Tinnitus 09/09/2019   History of sudden cardiac arrest 01/09/2019   Viral syndrome 09/06/2018   Seizures ([place]) 03/02/2018   Syncope 12/26/2017   Other social stressor 12/26/2017   Health care maintenance 08/14/2017   Family hx of prostate cancer    Allergy    Insomnia 10/20/2015   Advance care planning 03/08/2014   Memory loss 09/14/2013   Hypoxic brain injury ([place]) 09/14/2013   Medicare annual wellness visit, subsequent 02/16/2013   Shortness of breath 08/10/2011   Implantable cardioverter-defibrillator (ICD) in situ 02/03/2009   Hypothyroidism 11/18/2008  CONTACT DERMATITIS&OTHER ECZEMA DUE TO PLANTS 02/03/2008   IDIOPATHIC URTICARIA 02/03/2008   Hypertension 02/03/2008   HLD (hyperlipidemia) Oct 28, 2007   Attention deficit disorder 10-28-2007   PVC/VT non sustained 10/28/2007   Sleep apnea 10-28-2007   HEADACHE 2007-10-28   SUDDEN DEATH-aborted 10/28/2007   LOW BACK PAIN SYNDROME 10/11/2007    REFERRING DIAG: Hypoxic Brain Injury  THERAPY DIAG:  Abnormality of gait and mobility  Difficulty in walking, not elsewhere classified  Cervicalgia  Muscle weakness (generalized)  Other abnormalities  of gait and mobility  Other lack of coordination  Rationale for Evaluation and Treatment Rehabilitation  PERTINENT HISTORY: Per note submitted by Dr. Krista Blue on 12/15/2020- The patient has an underlying medical history of cardiac death from ventricular fibrillation with hypoxic brain injury in Jan 1999 due to arrythmia, 15 minutes without heart beat,  status post defibrillator placement, He stayed in hospital for 35 days. On 12/16/2020-Gait abnormality, Also related to the previous anoxic brain injury,   PRECAUTIONS: Fall  SUBJECTIVE: Patient reports he was seen by ortho and diagnosed with "Bad arthritis" He states the MD was pleased with the fact that he was taking PT and wants him to continue. He states going for corizone injection in early August.   PAIN: Are you having pain? Yes:   PAIN:  Are you having pain? Yes: NPRS scale: 7/10 Pain location: posterior neck Pain description: ache/tightness Aggravating factors: moving my head Relieving factors: rest    TODAY'S TREATMENT:    Manual therapy:  heat pad under neck and upper traps   Suboccipital release 5x 30 seconds Cervical rotation with overpressure at glenohumeral joint and occiput 3x 30 seconds each side Cervical Side bending with overpressure at end range- hold 30 sec 4 each side Grade II PA mobilizations to cervical and thoracic spine x 5 minutes   Trigger Point Dry Needling (TDN), unbilled Education performed with patient regarding potential benefit of TDN. Reviewed precautions and risks with patient. Reviewed special precautions/risks over lung fields which include pneumothorax. Reviewed signs and symptoms of pneumothorax and advised pt to go to ER immediately if these symptoms develop advise them of dry needling treatment. Extensive time spent with pt to ensure full understanding of TDN risks. Pt provided verbal consent to treatment. TDN performed to  with 0.3 x 30 single needle placements with local twitch response (LTR).  Pistoning technique utilized. Improved pain-free motion following intervention. Muscles targeted: bilateral upper trap and cervical paraspinals x 3 minutes  Therapeutic Exercises:      Seated cervical rotation 10x each side  Supine chin retraction with 5 sec hold x 10 reps   Education provided throughout session via VC/TC and demonstration to facilitate movement at target joints and correct muscle activation for all testing and exercises performed.       PATIENT EDUCATION: Education details: Exercise technique Person educated: Patient Education method: Explanation, Demonstration, Tactile cues, and Verbal cues Education comprehension: verbalized understanding, returned demonstration, verbal cues required, tactile cues required, and needs further education   HOME EXERCISE PROGRAM: No updates today   PT Short Term Goals -       PT SHORT TERM GOAL #1   Title Pt will be independent with HEP in order to improve strength and balance in order to decrease fall risk and improve function at home and work.    Baseline 01/27/2021- Patient presents with no formal HEP in place. 03/18/2021- Patient is knowledgeable of HEP- WIfe reports he is not always compliant but not because I don't know  what to do."    Time 6    Period Weeks    Status Achieved    Target Date 03/10/21              PT Long Term Goals -       PT LONG TERM GOAL #1   Title Pt will improve BERG by at least 5 points in order to demonstrate clinically significant improvement in balance.    Baseline 01/27/2021= 43/56; 03/18/2021=50/56    Time 12    Period Weeks    Status Achieved      PT LONG TERM GOAL #2   Title Pt will improve FOTO to target score of 55% to display perceived improvements in ability to complete ADL's.    Baseline 01/27/2021= 42%; 04/22/2021- Will assess next visit; 05/06/2021= 64%    Time 12    Period Weeks    Status Achieved      PT LONG TERM GOAL #3   Title Pt will decrease 5TSTS by at least 3  seconds in order to demonstrate clinically significant improvement in LE strength.    Baseline 01/27/2021= 30.07 without UE support; 03/18/2021= 24.78 sec without UE Support; 04/22/2021= 14.47 sec without UE support; 07/15/2021- 17.43 sec without UE support    Time 12    Period Weeks    Status Achieved      PT LONG TERM GOAL #4   Title Pt will decrease TUG to below 15  seconds/decrease in order to demonstrate decreased fall risk.    Baseline 01/27/2021= 20.97 without an AD; 03/18/2021= 19.2 sec without AD; 04/22/2021= 17 sec without AD; 05/13/2021= 14.88 sec. 07/15/2021= 14.7 sec avg without AD    Time 12    Period Weeks    Status Achieved    Target Date 07/15/21      PT LONG TERM GOAL #5   Title Pt will increase 10MWT by at least 0.13 m/s in order to demonstrate clinically significant improvement in community ambulation.    Baseline 01/27/2021= 0.67 m/s without an AD.  9/14= 0.75 m/s; 04/22/2021= 0.82 sec without an AD- WIll keep goal active to ensure patient can perform consistently. 05/13/2021= 0.89 m/s. 07/15/2021= 0.98 m/s    Time 12    Period Weeks    Status Achieved    Target Date 07/15/21      PT LONG TERM GOAL #6   Title Pt will increase 6MWT by at least 31m(1656f in order to demonstrate clinically significant improvement in cardiopulmonary endurance and community ambulation    Baseline 02/03/2021= 1100 feet; 03/18/21= 1125 feet without AD.  04/22/2021= 1175 feet without AD 07/15/2021= 1210 feet without an AD; 08/05/2021= 1190 feet without AD; 10/06/2021= 1250 feet; 01/14/2022- Deferred due to time- will reassess next visit   Time 12   Period Weeks    Status On-going    Target Date 04/08/2022     PT LONG TERM GOAL #7   Title Pt will increase 10MWT to 1.0 m/s or greater in order to demonstrate clinically significant improvement in community ambulation.    Baseline 07/15/2021= 0.98 m/s; 10/06/2021= 1.1 m/s    Time 12    Period Weeks    Status Achieved    Target Date 10/07/21      PT LONG  TERM GOAL #8   Title Patient will demonstrate improved flexibility in Cervical Rot and hamstring flexibility for improved functional independence in home and improved quality of life.    Baseline 10/06/2021=Hamstring length= 48 deg L; 55  deg R and Cervical Rotation- L=22/R=28 deg; 10/28/2021= Rot= L/R= 25/30; 01/14/2022= Rotation=L/R: 38 deg/40   Time 12    Period Weeks    Status Achieved   Target Date 01/12/2022   PT LONG TERM GOAL #9  Title Patient will report improved cervical pain consistently <5/10 for improved ability to drive, move his head and improved quality of life with household task.  Baseline 01/14/2022= 7/10 cervical pain  Time 12   Period Weeks   Status New   Target Date 04/08/2022     PT LONG TERM GOAL #10  Title Pt will demonstrate decrease in NDI by at least 19% in order to demonstrate clinically significant reduction in disability related to neck injury/pain   Baseline 01/14/2022= 60%  Time 12   Period Weeks   Status New   Target Date 04/08/2022   PT LONG TERM GOAL #11  Title Patient will demonstrate improved flexibility in Cervical Rotation and side bending for optimal head motion and ability to function  in home and improved quality of life.   Baseline  01/14/2022= Rotation=L/R: 38 deg/40 and SB=L/R 15 deg/19 deg  Time 12   Period Weeks   Status New  Target Date 01/12/2022     Plan - 12/23/21 0901     Clinical Impression Statement Patient presents with some improvement overall with ROM after manual stretching and dry needling today. Overall this cert- focus has shifted from balance and LE strength to more cervical issues including limited ROM and pain limited mobility. He has progressed slowly but most recently was seen by Ortho MD and diagnosed per patient with "bad arthritis" Unable to obtain any visit notes. Patient's condition has the potential to improve in response to therapy. Maximum improvement is yet to be obtained. The anticipated improvement is attainable and  reasonable in a generally predictable time. Updated goals to reflect focus on cervical pain and ROM.  Pt will continue to benefit from skilled PT intervention in order to improve his strength, balance, mobility and QOL    Personal Factors and Comorbidities Comorbidity 3+    Comorbidities Anoxic brain injury (1999), Cardiac (pacemaker/debrillator), HTN    Examination-Activity Limitations Bathing;Caring for Others;Lift;Stairs;Transfers    Examination-Participation Restrictions Community Activity;Yard Work    Stability/Clinical Decision Making Stable/Uncomplicated    Rehab Potential Good    PT Frequency 1x / week    PT Duration 12 weeks    PT Treatment/Interventions ADLs/Self Care Home Management;Cryotherapy;Moist Heat;DME Instruction;Gait training;Stair training;Functional mobility training;Therapeutic activities;Therapeutic exercise;Balance training;Neuromuscular re-education;Patient/family education;Manual techniques;Passive range of motion;Dry needling;Canalith Repostioning;Ultrasound;Vestibular;Joint Manipulations    PT Next Visit Plan Progress cervcial ROM/LE flexibility, dry needling as appropriate,  and  LE strengthening and continue with  balance exercises. Reassess 6 min walk for goals next visit   PT Home Exercise Plan No changes this visit    Consulted and Agree with Plan of Care Patient;Family member/caregiver    Family Member Consulted Wife- Anola Gurney, PT 01/14/2022, 4:25 PM

## 2022-01-22 ENCOUNTER — Ambulatory Visit: Payer: Medicare Other

## 2022-01-22 DIAGNOSIS — R278 Other lack of coordination: Secondary | ICD-10-CM | POA: Diagnosis not present

## 2022-01-22 DIAGNOSIS — R262 Difficulty in walking, not elsewhere classified: Secondary | ICD-10-CM | POA: Diagnosis not present

## 2022-01-22 DIAGNOSIS — R269 Unspecified abnormalities of gait and mobility: Secondary | ICD-10-CM

## 2022-01-22 DIAGNOSIS — M6281 Muscle weakness (generalized): Secondary | ICD-10-CM | POA: Diagnosis not present

## 2022-01-22 DIAGNOSIS — M542 Cervicalgia: Secondary | ICD-10-CM | POA: Diagnosis not present

## 2022-01-22 DIAGNOSIS — R2689 Other abnormalities of gait and mobility: Secondary | ICD-10-CM | POA: Diagnosis not present

## 2022-01-22 NOTE — Therapy (Signed)
OUTPATIENT PHYSICAL THERAPY TREATMENT NOTE/PROGRESS NOTE  Dates of Reporting Period: 10/28/21  - 01/22/22   Patient Name: Larry [name] MRN: [id number] DOB:Aug 16, 1950, 71 y.o., male Today's Date: 01/22/2022  PCP: Dr. Elsie Stain REFERRING PROVIDER: Dr. Marcial Pacas   PT End of Session - 01/22/22 0754     Visit Number 50    Number of Visits 12    Date for PT Re-Evaluation 04/08/22    Authorization Type [employer] Medicare    Authorization Time Period 01/27/2021- 95/18/8416; Recert 60/63/0160- 10/93/2355; 07/15/2021-10/07/2021; 10/06/2021- 12/29/2021; 01/14/2022-04/08/2022    Progress Note Due on Visit 37    PT Start Time 0800    PT Stop Time 0843    PT Time Calculation (min) 43 min    Equipment Utilized During Treatment Gait belt    Activity Tolerance Patient tolerated treatment well    Behavior During Therapy [facility] for tasks assessed/performed             Past Medical History:  Diagnosis Date   ADD (attention deficit disorder)    Allergy    seasonal flares of perinneal allergies   Cancer ([place])    melanoma   Cardiac arrest ([place]) 07/1997   aborted   Dementia ([place])    post resusitative   Depression    Diverticulosis    Family hx of prostate cancer    GERD (gastroesophageal reflux disease)    Headache(784.0)    Hyperlipidemia    Hypothyroidism    ICD (implantable cardiac defibrillator) in place    Idiopathic urticaria    Internal hemorrhoids    Low back pain syndrome    Melanoma in situ of skin of trunk ([place])    chest   PVC (premature ventricular contraction)    associated with cardiac arrest   Sleep apnea    no c-pap   Tubular adenoma of colon 10/2010   Past Surgical History:  Procedure Laterality Date    implantation x2     CARDIAC DEFIBRILLATOR PLACEMENT  07/01/2010   Explanation of a previously implanted device, pocket revision, and insertion of a new device, and intraoperative defibrillation threshold testing Caryl Comes)   CARDIAC DEFIBRILLATOR REMOVAL  2004    Defibrillator change Caryl Comes) 3 times changed   CATARACT EXTRACTION W/PHACO Left 02/04/2021   Procedure: CATARACT EXTRACTION PHACO AND INTRAOCULAR LENS PLACEMENT ([place]) LEFT VIVITY toric LENS 9.21 01:11.6;  Surgeon: Leandrew Koyanagi, MD;  Location: [place];  Service: Ophthalmology;  Laterality: Left;  keep patient at 11:30 arrival   CATARACT EXTRACTION W/PHACO Right 03/04/2021   Procedure: CATARACT EXTRACTION PHACO AND INTRAOCULAR LENS PLACEMENT ([place]) RIGHT VIVITY LENS 8.59 01:15.3;  Surgeon: Leandrew Koyanagi, MD;  Location: [place];  Service: Ophthalmology;  Laterality: Right;   HEMORRHOID SURGERY     ICD     TONSILLECTOMY     Patient Active Problem List   Diagnosis Date Noted   Neck pain 07/02/2021   Gait abnormality 12/16/2020   Nonintractable headache 09/30/2020   Tinnitus 09/09/2019   History of sudden cardiac arrest 01/09/2019   Viral syndrome 09/06/2018   Seizures ([place]) 03/02/2018   Syncope 12/26/2017   Other social stressor 12/26/2017   Health care maintenance 08/14/2017   Family hx of prostate cancer    Allergy    Insomnia 10/20/2015   Advance care planning 03/08/2014   Memory loss 09/14/2013   Hypoxic brain injury ([place]) 09/14/2013   Medicare annual wellness visit, subsequent 02/16/2013   Shortness of breath 08/10/2011   Implantable cardioverter-defibrillator (ICD) in  situ 02/03/2009   Hypothyroidism 11/18/2008   CONTACT DERMATITIS&OTHER ECZEMA DUE TO PLANTS 02/03/2008   IDIOPATHIC URTICARIA 02/03/2008   Hypertension 02/03/2008   HLD (hyperlipidemia) 2007/11/10   Attention deficit disorder 11-10-2007   PVC/VT non sustained Nov 10, 2007   Sleep apnea 11/10/07   HEADACHE Nov 10, 2007   SUDDEN DEATH-aborted 2007/11/10   LOW BACK PAIN SYNDROME 10/11/2007    REFERRING DIAG: Hypoxic Brain Injury  THERAPY DIAG:  Abnormality of gait and mobility  Difficulty in walking, not elsewhere classified  Cervicalgia  Muscle weakness  (generalized)  Other abnormalities of gait and mobility  Rationale for Evaluation and Treatment Rehabilitation  PERTINENT HISTORY: Per note submitted by Dr. Krista Blue on 12/15/2020- The patient has an underlying medical history of cardiac death from ventricular fibrillation with hypoxic brain injury in Jan 1999 due to arrythmia, 15 minutes without heart beat,  status post defibrillator placement, He stayed in hospital for 35 days. On 12/16/2020-Gait abnormality, Also related to the previous anoxic brain injury,   PRECAUTIONS: Fall  SUBJECTIVE: Pt reports neurologist referred pt to pain specialist for cervical spine. Reports significant R sided cervical pain.   PAIN: Are you having pain? Yes:   PAIN:  Are you having pain? Yes: NPRS scale: 5/10 Pain location: posterior neck Pain description: ache/tightness Aggravating factors: moving my head Relieving factors: rest    TODAY'S TREATMENT:  01/22/22   Manual therapy:  20 minutes lying in supine Suboccipital release 5 minutes Cervical rotation: x1 minute/direction with 5 sec passive holds Cervical Side bending with GHJ overpressure for upper trap stretching: 3x30 sec/side  Grade 2 side glides C2-7, 3 sec bouts/segment, x3 rounds R/L each side  Gentle cervical manual distractions: 3x30 sec    Therapeutic Exercises:  Supine cervical rotation 10x each side  Supine chin retraction with 5 sec hold x 10 reps   Education provided throughout session via VC/TC and demonstration to facilitate movement at target joints and correct muscle activation for all testing and exercises performed.       PATIENT EDUCATION: Education details: Exercise technique Person educated: Patient Education method: Explanation, Demonstration, Tactile cues, and Verbal cues Education comprehension: verbalized understanding, returned demonstration, verbal cues required, tactile cues required, and needs further education   HOME EXERCISE PROGRAM: No updates today    PT Short Term Goals -       PT SHORT TERM GOAL #1   Title Pt will be independent with HEP in order to improve strength and balance in order to decrease fall risk and improve function at home and work.    Baseline 01/27/2021- Patient presents with no formal HEP in place. 03/18/2021- Patient is knowledgeable of HEP- WIfe reports he is not always compliant but not because I don't know what to do."    Time 6    Period Weeks    Status Achieved    Target Date 03/10/21              PT Long Term Goals -       PT LONG TERM GOAL #1   Title Pt will improve BERG by at least 5 points in order to demonstrate clinically significant improvement in balance.    Baseline 01/27/2021= 43/56; 03/18/2021=50/56    Time 12    Period Weeks    Status Achieved      PT LONG TERM GOAL #2   Title Pt will improve FOTO to target score of 55% to display perceived improvements in ability to complete ADL's.    Baseline 01/27/2021= 42%;  04/22/2021- Will assess next visit; 05/06/2021= 64%    Time 12    Period Weeks    Status Achieved      PT LONG TERM GOAL #3   Title Pt will decrease 5TSTS by at least 3 seconds in order to demonstrate clinically significant improvement in LE strength.    Baseline 01/27/2021= 30.07 without UE support; 03/18/2021= 24.78 sec without UE Support; 04/22/2021= 14.47 sec without UE support; 07/15/2021- 17.43 sec without UE support    Time 12    Period Weeks    Status Achieved      PT LONG TERM GOAL #4   Title Pt will decrease TUG to below 15  seconds/decrease in order to demonstrate decreased fall risk.    Baseline 01/27/2021= 20.97 without an AD; 03/18/2021= 19.2 sec without AD; 04/22/2021= 17 sec without AD; 05/13/2021= 14.88 sec. 07/15/2021= 14.7 sec avg without AD    Time 12    Period Weeks    Status Achieved    Target Date 07/15/21      PT LONG TERM GOAL #5   Title Pt will increase 10MWT by at least 0.13 m/s in order to demonstrate clinically significant improvement in community  ambulation.    Baseline 01/27/2021= 0.67 m/s without an AD.  9/14= 0.75 m/s; 04/22/2021= 0.82 sec without an AD- WIll keep goal active to ensure patient can perform consistently. 05/13/2021= 0.89 m/s. 07/15/2021= 0.98 m/s    Time 12    Period Weeks    Status Achieved    Target Date 07/15/21      PT LONG TERM GOAL #6   Title Pt will increase 6MWT by at least 10m(1644f in order to demonstrate clinically significant improvement in cardiopulmonary endurance and community ambulation    Baseline 02/03/2021= 1100 feet; 03/18/21= 1125 feet without AD.  04/22/2021= 1175 feet without AD 07/15/2021= 1210 feet without an AD; 08/05/2021= 1190 feet without AD; 10/06/2021= 1250 feet; 01/14/2022- Deferred due to time- will reassess next visit; 01/22/22: 1,176'   Time 12   Period Weeks    Status On-going    Target Date 04/08/2022     PT LONG TERM GOAL #7   Title Pt will increase 10MWT to 1.0 m/s or greater in order to demonstrate clinically significant improvement in community ambulation.    Baseline 07/15/2021= 0.98 m/s; 10/06/2021= 1.1 m/s    Time 12    Period Weeks    Status Achieved    Target Date 10/07/21      PT LONG TERM GOAL #8   Title Patient will demonstrate improved flexibility in Cervical Rot and hamstring flexibility for improved functional independence in home and improved quality of life.    Baseline 10/06/2021=Hamstring length= 48 deg L; 55 deg R and Cervical Rotation- L=22/R=28 deg; 10/28/2021= Rot= L/R= 25/30; 01/14/2022= Rotation=L/R: 38 deg/40   Time 12    Period Weeks    Status Achieved   Target Date 01/12/2022   PT LONG TERM GOAL #9  Title Patient will report improved cervical pain consistently <5/10 for improved ability to drive, move his head and improved quality of life with household task.  Baseline 01/14/2022= 7/10 cervical pain  Time 12   Period Weeks   Status New   Target Date 04/08/2022     PT LONG TERM GOAL #10  Title Pt will demonstrate decrease in NDI by at least 19% in order to  demonstrate clinically significant reduction in disability related to neck injury/pain   Baseline 01/14/2022= 60%  Time 12  Period Weeks   Status New   Target Date 04/08/2022   PT LONG TERM GOAL #11  Title Patient will demonstrate improved flexibility in Cervical Rotation and side bending for optimal head motion and ability to function  in home and improved quality of life.   Baseline  01/14/2022= Rotation=L/R: 38 deg/40 and SB=L/R 15 deg/19 deg  Time 12   Period Weeks   Status New  Target Date 01/12/2022     Plan - 12/23/21 0901     Clinical Impression Statement Pt due for progress note. Recently recerted last session with shift in focus from gait/balance to cervical pain. Please reassess previous note for details towards those goals. Reassessed 6MWT as this was deferred last session. Pt decreased 6MWT to 1,176' but remains > 1000' indicative of decreased risk of falls for community ambulation. Primary PT has made new goals to address cervical impairments. Pt endorses decreased cervical pain and stiffness post treatment. Will continue PT POC to address cervical impairments going forward.     Personal Factors and Comorbidities Comorbidity 3+    Comorbidities Anoxic brain injury (1999), Cardiac (pacemaker/debrillator), HTN    Examination-Activity Limitations Bathing;Caring for Others;Lift;Stairs;Transfers    Examination-Participation Restrictions Community Activity;Yard Work    Stability/Clinical Decision Making Stable/Uncomplicated    Rehab Potential Good    PT Frequency 1x / week    PT Duration 12 weeks    PT Treatment/Interventions ADLs/Self Care Home Management;Cryotherapy;Moist Heat;DME Instruction;Gait training;Stair training;Functional mobility training;Therapeutic activities;Therapeutic exercise;Balance training;Neuromuscular re-education;Patient/family education;Manual techniques;Passive range of motion;Dry needling;Canalith Repostioning;Ultrasound;Vestibular;Joint Manipulations     PT Next Visit Plan Progress cervcial ROM/LE flexibility, dry needling as appropriate,  and  LE strengthening and continue with  balance exercises.   PT Home Exercise Plan No changes this visit    Consulted and Agree with Plan of Care Patient;Family member/caregiver    Family Member Consulted Wife- Vladimir Faster. Fairly IV, PT, DPT Physical Therapist- [facility]  01/22/2022, 9:28 AM

## 2022-01-25 NOTE — Progress Notes (Signed)
Remote ICD transmission.   

## 2022-01-28 ENCOUNTER — Ambulatory Visit: Payer: Medicare Other

## 2022-01-28 DIAGNOSIS — R278 Other lack of coordination: Secondary | ICD-10-CM | POA: Diagnosis not present

## 2022-01-28 DIAGNOSIS — R2689 Other abnormalities of gait and mobility: Secondary | ICD-10-CM | POA: Diagnosis not present

## 2022-01-28 DIAGNOSIS — M6281 Muscle weakness (generalized): Secondary | ICD-10-CM

## 2022-01-28 DIAGNOSIS — R269 Unspecified abnormalities of gait and mobility: Secondary | ICD-10-CM | POA: Diagnosis not present

## 2022-01-28 DIAGNOSIS — R262 Difficulty in walking, not elsewhere classified: Secondary | ICD-10-CM | POA: Diagnosis not present

## 2022-01-28 DIAGNOSIS — M542 Cervicalgia: Secondary | ICD-10-CM | POA: Diagnosis not present

## 2022-01-28 NOTE — Therapy (Signed)
OUTPATIENT PHYSICAL THERAPY TREATMENT NOTE  Patient Name: Larry [name] MRN: [id number] DOB:1950/09/27, 71 y.o., male Today's Date: 01/28/2022  PCP: Dr. Elsie Stain REFERRING PROVIDER: Dr. Marcial Pacas   PT End of Session - 01/28/22 0826     Visit Number 51    Number of Visits 12    Date for PT Re-Evaluation 04/08/22    Authorization Type [employer] Medicare    Authorization Time Period 01/27/2021- 88/41/6606; Recert 30/16/0109- 32/35/5732; 07/15/2021-10/07/2021; 10/06/2021- 12/29/2021; 01/14/2022-04/08/2022    Progress Note Due on Visit 52    PT Start Time 0802    PT Stop Time 0845    PT Time Calculation (min) 43 min    Equipment Utilized During Treatment Gait belt    Activity Tolerance Patient tolerated treatment well    Behavior During Therapy WFL for tasks assessed/performed              Past Medical History:  Diagnosis Date   ADD (attention deficit disorder)    Allergy    seasonal flares of perinneal allergies   Cancer ([place])    melanoma   Cardiac arrest ([place]) 07/1997   aborted   Dementia ([place])    post resusitative   Depression    Diverticulosis    Family hx of prostate cancer    GERD (gastroesophageal reflux disease)    Headache(784.0)    Hyperlipidemia    Hypothyroidism    ICD (implantable cardiac defibrillator) in place    Idiopathic urticaria    Internal hemorrhoids    Low back pain syndrome    Melanoma in situ of skin of trunk ([place])    chest   PVC (premature ventricular contraction)    associated with cardiac arrest   Sleep apnea    no c-pap   Tubular adenoma of colon 10/2010   Past Surgical History:  Procedure Laterality Date    implantation x2     CARDIAC DEFIBRILLATOR PLACEMENT  07/01/2010   Explanation of a previously implanted device, pocket revision, and insertion of a new device, and intraoperative defibrillation threshold testing Caryl Comes)   CARDIAC DEFIBRILLATOR REMOVAL  2004   Defibrillator change Caryl Comes) 3 times changed   CATARACT EXTRACTION  W/PHACO Left 02/04/2021   Procedure: CATARACT EXTRACTION PHACO AND INTRAOCULAR LENS PLACEMENT ([place]) LEFT VIVITY toric LENS 9.21 01:11.6;  Surgeon: Leandrew Koyanagi, MD;  Location: [facility];  Service: Ophthalmology;  Laterality: Left;  keep patient at 11:30 arrival   CATARACT EXTRACTION W/PHACO Right 03/04/2021   Procedure: CATARACT EXTRACTION PHACO AND INTRAOCULAR LENS PLACEMENT ([place]) RIGHT VIVITY LENS 8.59 01:15.3;  Surgeon: Leandrew Koyanagi, MD;  Location: [facility];  Service: Ophthalmology;  Laterality: Right;   HEMORRHOID SURGERY     ICD     TONSILLECTOMY     Patient Active Problem List   Diagnosis Date Noted   Neck pain 07/02/2021   Gait abnormality 12/16/2020   Nonintractable headache 09/30/2020   Tinnitus 09/09/2019   History of sudden cardiac arrest 01/09/2019   Viral syndrome 09/06/2018   Seizures ([place]) 03/02/2018   Syncope 12/26/2017   Other social stressor 12/26/2017   Health care maintenance 08/14/2017   Family hx of prostate cancer    Allergy    Insomnia 10/20/2015   Advance care planning 03/08/2014   Memory loss 09/14/2013   Hypoxic brain injury ([place]) 09/14/2013   Medicare annual wellness visit, subsequent 02/16/2013   Shortness of breath 08/10/2011   Implantable cardioverter-defibrillator (ICD) in situ 02/03/2009   Hypothyroidism 11/18/2008   CONTACT DERMATITIS&OTHER  ECZEMA DUE TO PLANTS 02/03/2008   IDIOPATHIC URTICARIA 02/03/2008   Hypertension 02/03/2008   HLD (hyperlipidemia) 10-31-2007   Attention deficit disorder 10/31/2007   PVC/VT non sustained 31-Oct-2007   Sleep apnea 2007/10/31   HEADACHE October 31, 2007   SUDDEN DEATH-aborted 10-31-2007   LOW BACK PAIN SYNDROME 10/11/2007    REFERRING DIAG: Hypoxic Brain Injury  THERAPY DIAG:  Abnormality of gait and mobility  Difficulty in walking, not elsewhere classified  Cervicalgia  Muscle weakness (generalized)  Rationale for Evaluation and Treatment Rehabilitation  PERTINENT  HISTORY: Per note submitted by Dr. Krista Blue on 12/15/2020- The patient has an underlying medical history of cardiac death from ventricular fibrillation with hypoxic brain injury in Jan 1999 due to arrythmia, 15 minutes without heart beat,  status post defibrillator placement, He stayed in hospital for 35 days. On 12/16/2020-Gait abnormality, Also related to the previous anoxic brain injury,   PRECAUTIONS: Fall  SUBJECTIVE: Patient reports he has been doing the exercises and using his heating pad.   PAIN: Are you having pain? Yes:   PAIN:  Are you having pain? Yes: NPRS scale: 7/10 Pain location: posterior neck Pain description: ache/tightness Aggravating factors: moving my head Relieving factors: rest    TODAY'S TREATMENT:    Manual therapy:  heat pad under neck and upper traps   J mobilization 30 seconds x3 trial Grade II PA mobilizations to cervical and thoracic spine x 6 minutes   Trigger Point Dry Needling (TDN), unbilled Education performed with patient regarding potential benefit of TDN. Reviewed precautions and risks with patient. Reviewed special precautions/risks over lung fields which include pneumothorax. Reviewed signs and symptoms of pneumothorax and advised pt to go to ER immediately if these symptoms develop advise them of dry needling treatment. Extensive time spent with pt to ensure full understanding of TDN risks. Pt provided verbal consent to treatment. TDN performed to  with 0.3 x 30 single needle placements with local twitch response (LTR). Pistoning technique utilized. Improved pain-free motion following intervention. Muscles targeted: bilateral upper trap and cervical paraspinals x 7 minutes  Therapeutic Exercises:   Supine: -scapular retraction with rotation 10x each side -towel distraction with cervical extension 10x -chin tucks 10x 5 second holds  -overhead Y raise RTB 10x   Seated cervical rotation 10x each side     Education provided throughout session via  VC/TC and demonstration to facilitate movement at target joints and correct muscle activation for all testing and exercises performed.       PATIENT EDUCATION: Education details: Exercise technique Person educated: Patient Education method: Explanation, Demonstration, Tactile cues, and Verbal cues Education comprehension: verbalized understanding, returned demonstration, verbal cues required, tactile cues required, and needs further education   HOME EXERCISE PROGRAM: No updates today   PT Short Term Goals -       PT SHORT TERM GOAL #1   Title Pt will be independent with HEP in order to improve strength and balance in order to decrease fall risk and improve function at home and work.    Baseline 01/27/2021- Patient presents with no formal HEP in place. 03/18/2021- Patient is knowledgeable of HEP- WIfe reports he is not always compliant but not because I don't know what to do."    Time 6    Period Weeks    Status Achieved    Target Date 03/10/21              PT Long Term Goals -       PT LONG TERM GOAL #  1   Title Pt will improve BERG by at least 5 points in order to demonstrate clinically significant improvement in balance.    Baseline 01/27/2021= 43/56; 03/18/2021=50/56    Time 12    Period Weeks    Status Achieved      PT LONG TERM GOAL #2   Title Pt will improve FOTO to target score of 55% to display perceived improvements in ability to complete ADL's.    Baseline 01/27/2021= 42%; 04/22/2021- Will assess next visit; 05/06/2021= 64%    Time 12    Period Weeks    Status Achieved      PT LONG TERM GOAL #3   Title Pt will decrease 5TSTS by at least 3 seconds in order to demonstrate clinically significant improvement in LE strength.    Baseline 01/27/2021= 30.07 without UE support; 03/18/2021= 24.78 sec without UE Support; 04/22/2021= 14.47 sec without UE support; 07/15/2021- 17.43 sec without UE support    Time 12    Period Weeks    Status Achieved      PT LONG TERM GOAL  #4   Title Pt will decrease TUG to below 15  seconds/decrease in order to demonstrate decreased fall risk.    Baseline 01/27/2021= 20.97 without an AD; 03/18/2021= 19.2 sec without AD; 04/22/2021= 17 sec without AD; 05/13/2021= 14.88 sec. 07/15/2021= 14.7 sec avg without AD    Time 12    Period Weeks    Status Achieved    Target Date 07/15/21      PT LONG TERM GOAL #5   Title Pt will increase 10MWT by at least 0.13 m/s in order to demonstrate clinically significant improvement in community ambulation.    Baseline 01/27/2021= 0.67 m/s without an AD.  9/14= 0.75 m/s; 04/22/2021= 0.82 sec without an AD- WIll keep goal active to ensure patient can perform consistently. 05/13/2021= 0.89 m/s. 07/15/2021= 0.98 m/s    Time 12    Period Weeks    Status Achieved    Target Date 07/15/21      PT LONG TERM GOAL #6   Title Pt will increase 6MWT by at least 57m(1661f in order to demonstrate clinically significant improvement in cardiopulmonary endurance and community ambulation    Baseline 02/03/2021= 1100 feet; 03/18/21= 1125 feet without AD.  04/22/2021= 1175 feet without AD 07/15/2021= 1210 feet without an AD; 08/05/2021= 1190 feet without AD; 10/06/2021= 1250 feet; 01/14/2022- Deferred due to time- will reassess next visit   Time 12   Period Weeks    Status On-going    Target Date 04/08/2022     PT LONG TERM GOAL #7   Title Pt will increase 10MWT to 1.0 m/s or greater in order to demonstrate clinically significant improvement in community ambulation.    Baseline 07/15/2021= 0.98 m/s; 10/06/2021= 1.1 m/s    Time 12    Period Weeks    Status Achieved    Target Date 10/07/21      PT LONG TERM GOAL #8   Title Patient will demonstrate improved flexibility in Cervical Rot and hamstring flexibility for improved functional independence in home and improved quality of life.    Baseline 10/06/2021=Hamstring length= 48 deg L; 55 deg R and Cervical Rotation- L=22/R=28 deg; 10/28/2021= Rot= L/R= 25/30; 01/14/2022=  Rotation=L/R: 38 deg/40   Time 12    Period Weeks    Status Achieved   Target Date 01/12/2022   PT LONG TERM GOAL #9  Title Patient will report improved cervical pain consistently <5/10 for  improved ability to drive, move his head and improved quality of life with household task.  Baseline 01/14/2022= 7/10 cervical pain  Time 12   Period Weeks   Status New   Target Date 04/08/2022     PT LONG TERM GOAL #10  Title Pt will demonstrate decrease in NDI by at least 19% in order to demonstrate clinically significant reduction in disability related to neck injury/pain   Baseline 01/14/2022= 60%  Time 12   Period Weeks   Status New   Target Date 04/08/2022   PT LONG TERM GOAL #11  Title Patient will demonstrate improved flexibility in Cervical Rotation and side bending for optimal head motion and ability to function  in home and improved quality of life.   Baseline  01/14/2022= Rotation=L/R: 38 deg/40 and SB=L/R 15 deg/19 deg  Time 12   Period Weeks   Status New  Target Date 01/12/2022     Plan - 12/23/21 0901     Clinical Impression Statement Patient has multiple large trigger points in bilateral upper trap with R>L. Patient has improved ROM s/p dry needling in combination with therex allowing for decreased pain with head rotation. Cueing for sequencing of interventions performed with patient following commands well.  Pt will continue to benefit from skilled PT intervention in order to improve his strength, balance, mobility and QOL    Personal Factors and Comorbidities Comorbidity 3+    Comorbidities Anoxic brain injury (1999), Cardiac (pacemaker/debrillator), HTN    Examination-Activity Limitations Bathing;Caring for Others;Lift;Stairs;Transfers    Examination-Participation Restrictions Community Activity;Yard Work    Stability/Clinical Decision Making Stable/Uncomplicated    Rehab Potential Good    PT Frequency 1x / week    PT Duration 12 weeks    PT Treatment/Interventions ADLs/Self  Care Home Management;Cryotherapy;Moist Heat;DME Instruction;Gait training;Stair training;Functional mobility training;Therapeutic activities;Therapeutic exercise;Balance training;Neuromuscular re-education;Patient/family education;Manual techniques;Passive range of motion;Dry needling;Canalith Repostioning;Ultrasound;Vestibular;Joint Manipulations    PT Next Visit Plan Progress cervcial ROM/LE flexibility, dry needling as appropriate,  and  LE strengthening and continue with  balance exercises. Reassess 6 min walk for goals next visit   PT Home Exercise Plan No changes this visit    Consulted and Agree with Plan of Care Patient;Family member/caregiver    Family Member Consulted Wife- Kym Groom, [place] 01/28/2022, 9:06 AM

## 2022-02-04 ENCOUNTER — Ambulatory Visit: Payer: Medicare Other | Attending: Neurology

## 2022-02-04 DIAGNOSIS — R2689 Other abnormalities of gait and mobility: Secondary | ICD-10-CM

## 2022-02-04 DIAGNOSIS — R262 Difficulty in walking, not elsewhere classified: Secondary | ICD-10-CM | POA: Diagnosis not present

## 2022-02-04 DIAGNOSIS — R278 Other lack of coordination: Secondary | ICD-10-CM

## 2022-02-04 DIAGNOSIS — R296 Repeated falls: Secondary | ICD-10-CM | POA: Diagnosis not present

## 2022-02-04 DIAGNOSIS — R2681 Unsteadiness on feet: Secondary | ICD-10-CM | POA: Diagnosis not present

## 2022-02-04 DIAGNOSIS — M542 Cervicalgia: Secondary | ICD-10-CM

## 2022-02-04 DIAGNOSIS — R269 Unspecified abnormalities of gait and mobility: Secondary | ICD-10-CM

## 2022-02-04 DIAGNOSIS — M6281 Muscle weakness (generalized): Secondary | ICD-10-CM | POA: Diagnosis not present

## 2022-02-04 NOTE — Therapy (Signed)
OUTPATIENT PHYSICAL THERAPY TREATMENT NOTE/Discharge Summary  Patient Name: Larry [name] MRN: [id number] DOB:June 19, 1951, 71 y.o., male Today's Date: 02/04/2022  PCP: Dr. Elsie Stain REFERRING PROVIDER: Dr. Marcial Pacas   PT End of Session - 02/04/22 0810     Visit Number 52    Number of Visits 12    Date for PT Re-Evaluation 04/08/22    Authorization Type [employer] Medicare    Authorization Time Period 01/27/2021- 45/62/5638; Recert 93/73/4287- 68/05/5725; 07/15/2021-10/07/2021; 10/06/2021- 12/29/2021; 01/14/2022-04/08/2022    Progress Note Due on Visit 39    PT Start Time 0805    PT Stop Time 2035    PT Time Calculation (min) 39 min    Equipment Utilized During Treatment Gait belt    Activity Tolerance Patient tolerated treatment well    Behavior During Therapy [facility] for tasks assessed/performed               Past Medical History:  Diagnosis Date   ADD (attention deficit disorder)    Allergy    seasonal flares of perinneal allergies   Cancer ([place])    melanoma   Cardiac arrest ([place]) 07/1997   aborted   Dementia ([place])    post resusitative   Depression    Diverticulosis    Family hx of prostate cancer    GERD (gastroesophageal reflux disease)    Headache(784.0)    Hyperlipidemia    Hypothyroidism    ICD (implantable cardiac defibrillator) in place    Idiopathic urticaria    Internal hemorrhoids    Low back pain syndrome    Melanoma in situ of skin of trunk ([place])    chest   PVC (premature ventricular contraction)    associated with cardiac arrest   Sleep apnea    no c-pap   Tubular adenoma of colon 10/2010   Past Surgical History:  Procedure Laterality Date    implantation x2     CARDIAC DEFIBRILLATOR PLACEMENT  07/01/2010   Explanation of a previously implanted device, pocket revision, and insertion of a new device, and intraoperative defibrillation threshold testing Caryl Comes)   CARDIAC DEFIBRILLATOR REMOVAL  2004   Defibrillator change Caryl Comes) 3 times changed    CATARACT EXTRACTION W/PHACO Left 02/04/2021   Procedure: CATARACT EXTRACTION PHACO AND INTRAOCULAR LENS PLACEMENT ([place]) LEFT VIVITY toric LENS 9.21 01:11.6;  Surgeon: Leandrew Koyanagi, MD;  Location: [facility];  Service: Ophthalmology;  Laterality: Left;  keep patient at 11:30 arrival   CATARACT EXTRACTION W/PHACO Right 03/04/2021   Procedure: CATARACT EXTRACTION PHACO AND INTRAOCULAR LENS PLACEMENT ([place]) RIGHT VIVITY LENS 8.59 01:15.3;  Surgeon: Leandrew Koyanagi, MD;  Location: [facility];  Service: Ophthalmology;  Laterality: Right;   HEMORRHOID SURGERY     ICD     TONSILLECTOMY     Patient Active Problem List   Diagnosis Date Noted   Neck pain 07/02/2021   Gait abnormality 12/16/2020   Nonintractable headache 09/30/2020   Tinnitus 09/09/2019   History of sudden cardiac arrest 01/09/2019   Viral syndrome 09/06/2018   Seizures ([place]) 03/02/2018   Syncope 12/26/2017   Other social stressor 12/26/2017   Health care maintenance 08/14/2017   Family hx of prostate cancer    Allergy    Insomnia 10/20/2015   Advance care planning 03/08/2014   Memory loss 09/14/2013   Hypoxic brain injury ([place]) 09/14/2013   Medicare annual wellness visit, subsequent 02/16/2013   Shortness of breath 08/10/2011   Implantable cardioverter-defibrillator (ICD) in situ 02/03/2009   Hypothyroidism 11/18/2008  CONTACT DERMATITIS&OTHER ECZEMA DUE TO PLANTS 02/03/2008   IDIOPATHIC URTICARIA 02/03/2008   Hypertension 02/03/2008   HLD (hyperlipidemia) 2007-11-09   Attention deficit disorder 09-Nov-2007   PVC/VT non sustained 2007/11/09   Sleep apnea 2007-11-09   HEADACHE Nov 09, 2007   SUDDEN DEATH-aborted 2007-11-09   LOW BACK PAIN SYNDROME 10/11/2007    REFERRING DIAG: Hypoxic Brain Injury  THERAPY DIAG:  Abnormality of gait and mobility  Difficulty in walking, not elsewhere classified  Cervicalgia  Muscle weakness (generalized)  Other abnormalities of gait and  mobility  Other lack of coordination  Unsteadiness on feet  Repeated falls  Rationale for Evaluation and Treatment Rehabilitation  PERTINENT HISTORY: Per note submitted by Dr. Krista Blue on 12/15/2020- The patient has an underlying medical history of cardiac death from ventricular fibrillation with hypoxic brain injury in Jan 1999 due to arrythmia, 15 minutes without heart beat,  status post defibrillator placement, He stayed in hospital for 35 days. On 12/16/2020-Gait abnormality, Also related to the previous anoxic brain injury,   PRECAUTIONS: Fall  SUBJECTIVE: Patient reports doing well overall with balance and denies any falls. Reports will be going to pain clinic for further treatment of ongoing neck pain. Reports 6/10 neck pain. States he has been more compliant with cervical HEP.  PAIN: Are you having pain? Yes:   PAIN:  Are you having pain? Yes: NPRS scale: 6/10 Pain location: posterior neck Pain description: ache/tightness Aggravating factors: moving my head Relieving factors: rest    TODAY'S TREATMENT:    Manual therapy:  heat pad under neck and upper traps   J mobilization 30 seconds x3 trial Grade II PA mobilizations to cervical and thoracic spine x 6 minutes Passive ROM to C-spine- Flex/ext/Rotation B/B sidebending   Therapeutic Exercises:   Seated: Reviewed AROM c-spine as patient reports he misplaced his HEP -scapular retraction -BTB 2 sets of 12 reps -chin tucks 10x 5 second holds  -AROM- Cervical flex/Ext/rotation/SB x 15 reps each    *See goal section for final assessment of all remaining goals.    Education provided throughout session via VC/TC and demonstration to facilitate movement at target joints and correct muscle activation for all testing and exercises performed.       PATIENT EDUCATION: Education details: Exercise technique Person educated: Patient Education method: Explanation, Demonstration, Tactile cues, and Verbal cues Education comprehension:  verbalized understanding, returned demonstration, verbal cues required, tactile cues required, and needs further education   HOME EXERCISE PROGRAM: Access Code: [email or web address] URL: [email or web address] Date: 02/04/2022 Prepared by: Sande Brothers  Exercises - Standing Cervical Flexion AROM  - 1 x daily - 7 x weekly - 3 sets - 10 reps - Standing Cervical Extension AROM  - 1 x daily - 7 x weekly - 3 sets - 10 reps - Standing Cervical Sidebending AROM  - 1 x daily - 7 x weekly - 3 sets - 10 reps - Standing Cervical Rotation AROM  - 1 x daily - 7 x weekly - 3 sets - 10 reps - Standing Cervical Retraction  - 1 x daily - 7 x weekly - 3 sets - 10 reps - Seated Assisted Cervical Rotation with Towel  - 1 x daily - 7 x weekly - 3 sets - 10 reps   PT Short Term Goals -       PT SHORT TERM GOAL #1   Title Pt will be independent with HEP in order to improve strength and balance in order to decrease fall risk and improve function at home  and work.    Baseline 01/27/2021- Patient presents with no formal HEP in place. 03/18/2021- Patient is knowledgeable of HEP- WIfe reports he is not always compliant but not because I don't know what to do."    Time 6    Period Weeks    Status Achieved    Target Date 03/10/21              PT Long Term Goals -       PT LONG TERM GOAL #1   Title Pt will improve BERG by at least 5 points in order to demonstrate clinically significant improvement in balance.    Baseline 01/27/2021= 43/56; 03/18/2021=50/56    Time 12    Period Weeks    Status Achieved      PT LONG TERM GOAL #2   Title Pt will improve FOTO to target score of 55% to display perceived improvements in ability to complete ADL's.    Baseline 01/27/2021= 42%; 04/22/2021- Will assess next visit; 05/06/2021= 64%    Time 12    Period Weeks    Status Achieved      PT LONG TERM GOAL #3   Title Pt will decrease 5TSTS by at least 3 seconds in order to demonstrate clinically significant  improvement in LE strength.    Baseline 01/27/2021= 30.07 without UE support; 03/18/2021= 24.78 sec without UE Support; 04/22/2021= 14.47 sec without UE support; 07/15/2021- 17.43 sec without UE support    Time 12    Period Weeks    Status Achieved      PT LONG TERM GOAL #4   Title Pt will decrease TUG to below 15  seconds/decrease in order to demonstrate decreased fall risk.    Baseline 01/27/2021= 20.97 without an AD; 03/18/2021= 19.2 sec without AD; 04/22/2021= 17 sec without AD; 05/13/2021= 14.88 sec. 07/15/2021= 14.7 sec avg without AD    Time 12    Period Weeks    Status Achieved    Target Date 07/15/21      PT LONG TERM GOAL #5   Title Pt will increase 10MWT by at least 0.13 m/s in order to demonstrate clinically significant improvement in community ambulation.    Baseline 01/27/2021= 0.67 m/s without an AD.  9/14= 0.75 m/s; 04/22/2021= 0.82 sec without an AD- WIll keep goal active to ensure patient can perform consistently. 05/13/2021= 0.89 m/s. 07/15/2021= 0.98 m/s    Time 12    Period Weeks    Status Achieved    Target Date 07/15/21      PT LONG TERM GOAL #6   Title Pt will increase 6MWT by at least 36m(1647f in order to demonstrate clinically significant improvement in cardiopulmonary endurance and community ambulation    Baseline 02/03/2021= 1100 feet; 03/18/21= 1125 feet without AD.  04/22/2021= 1175 feet without AD 07/15/2021= 1210 feet without an AD; 08/05/2021= 1190 feet without AD; 10/06/2021= 1250 feet; 01/14/2022- Deferred due to time- will reassess next visit   Time 12   Period Weeks    Status On-going    Target Date 04/08/2022     PT LONG TERM GOAL #7   Title Pt will increase 10MWT to 1.0 m/s or greater in order to demonstrate clinically significant improvement in community ambulation.    Baseline 07/15/2021= 0.98 m/s; 10/06/2021= 1.1 m/s    Time 12    Period Weeks    Status Achieved    Target Date 10/07/21      PT LONG TERM GOAL #8  Title Patient will demonstrate improved  flexibility in Cervical Rot and hamstring flexibility for improved functional independence in home and improved quality of life.    Baseline 10/06/2021=Hamstring length= 48 deg L; 55 deg R and Cervical Rotation- L=22/R=28 deg; 10/28/2021= Rot= L/R= 25/30; 01/14/2022= Rotation=L/R: 38 deg/40   Time 12    Period Weeks    Status Achieved   Target Date 01/12/2022   PT LONG TERM GOAL #9  Title Patient will report improved cervical pain consistently <5/10 for improved ability to drive, move his head and improved quality of life with household task.  Baseline 01/14/2022= 7/10 cervical pain; 02/04/2022= 6/10 with some active movement  Time 12   Period Weeks   Status Not met  Target Date 04/08/2022     PT LONG TERM GOAL #10  Title Pt will demonstrate decrease in NDI by at least 19% in order to demonstrate clinically significant reduction in disability related to neck injury/pain   Baseline 01/14/2022= 60%; 02/04/2022= 30%  Time 12   Period Weeks   Status Met  Target Date 04/08/2022   PT LONG TERM GOAL #11  Title Patient will demonstrate improved flexibility in Cervical Rotation and side bending for optimal head motion and ability to function  in home and improved quality of life.   Baseline  01/14/2022= Rotation=L/R: 38 deg/40 and SB=L/R 15 deg/19 deg; 02/04/2022= Rotation=L/R: 42 deg/50 and SB=L/R 23deg/25 deg  Time 12   Period Weeks   Status MET  Target Date 01/12/2022     Plan - 12/23/21 0901     Clinical Impression Statement Patient presents for final PT visit today. Patient was initially sent to PT for impaired balance and progressed very well throughout course of rehab meeting all mobility goals. On last certification - plan transitioned to focus on some ongoing cervical pain and limited mobility. While patient did not meet his pain goal- he did report an overall decrease in pain and important to note he reported a 50% improved in the NDI representing improved overall perceived disability associated  with his cervical spine. He also met his ROM demonstrating improved overall mobility despite ongoing pain. Patient has met majority of goals and appropriate for discharge from skilled PT services today. He plans on following up with pain management for scheduled series of injections to further manage his cervical impairments.    Personal Factors and Comorbidities Comorbidity 3+    Comorbidities Anoxic brain injury (1999), Cardiac (pacemaker/debrillator), HTN    Examination-Activity Limitations Bathing;Caring for Others;Lift;Stairs;Transfers    Examination-Participation Restrictions Community Activity;Yard Work    Stability/Clinical Decision Making Stable/Uncomplicated    Rehab Potential Good    PT Frequency 1x / week    PT Duration 12 weeks    PT Treatment/Interventions ADLs/Self Care Home Management;Cryotherapy;Moist Heat;DME Instruction;Gait training;Stair training;Functional mobility training;Therapeutic activities;Therapeutic exercise;Balance training;Neuromuscular re-education;Patient/family education;Manual techniques;Passive range of motion;Dry needling;Canalith Repostioning;Ultrasound;Vestibular;Joint Manipulations    PT Next Visit Plan Progress cervcial ROM/LE flexibility, dry needling as appropriate,  and  LE strengthening and continue with  balance exercises. Reassess 6 min walk for goals next visit   PT Home Exercise Plan No changes this visit    Consulted and Agree with Plan of Care Patient;Family member/caregiver    Family Member Consulted Wife- Anola Gurney, PT 02/04/2022, 9:26 PM

## 2022-02-07 ENCOUNTER — Other Ambulatory Visit: Payer: Self-pay | Admitting: Family Medicine

## 2022-02-09 DIAGNOSIS — M47812 Spondylosis without myelopathy or radiculopathy, cervical region: Secondary | ICD-10-CM | POA: Diagnosis not present

## 2022-02-11 ENCOUNTER — Ambulatory Visit: Payer: Medicare Other

## 2022-02-18 ENCOUNTER — Ambulatory Visit: Payer: Medicare Other

## 2022-02-20 ENCOUNTER — Other Ambulatory Visit: Payer: Self-pay | Admitting: Internal Medicine

## 2022-02-25 ENCOUNTER — Ambulatory Visit: Payer: Medicare Other

## 2022-03-01 DIAGNOSIS — M542 Cervicalgia: Secondary | ICD-10-CM | POA: Diagnosis not present

## 2022-03-01 DIAGNOSIS — M47812 Spondylosis without myelopathy or radiculopathy, cervical region: Secondary | ICD-10-CM | POA: Diagnosis not present

## 2022-03-04 ENCOUNTER — Ambulatory Visit: Payer: Medicare Other

## 2022-04-02 ENCOUNTER — Telehealth: Payer: Self-pay | Admitting: Family Medicine

## 2022-04-02 MED ORDER — AMPHETAMINE-DEXTROAMPHETAMINE 10 MG PO TABS: 10.0000 mg | ORAL_TABLET | Freq: Two times a day (BID) | ORAL | 0 refills | Status: DC

## 2022-04-02 NOTE — Telephone Encounter (Signed)
Refill request for amphetamine-dextroamphetamine (ADDERALL) 10 MG tablet  LOV - 11/05/21 Next OV - not scheduled Last refill - 12/27/21 #180/0

## 2022-04-02 NOTE — Telephone Encounter (Signed)
Sent. Thanks.   

## 2022-04-02 NOTE — Telephone Encounter (Signed)
Patient called and stated he needs the medication amphetamine-dextroamphetamine (ADDERALL) 10 MG tablet but he needs the generic brand [phone] call into  [facility] [address] [place], [place] [facility] AT [address] Phone:  [phone]  Fax:  [phone]     180 pills. Call back number [phone].

## 2022-04-05 DIAGNOSIS — L570 Actinic keratosis: Secondary | ICD-10-CM | POA: Diagnosis not present

## 2022-04-05 DIAGNOSIS — Z08 Encounter for follow-up examination after completed treatment for malignant neoplasm: Secondary | ICD-10-CM | POA: Diagnosis not present

## 2022-04-05 DIAGNOSIS — Z85820 Personal history of malignant melanoma of skin: Secondary | ICD-10-CM | POA: Diagnosis not present

## 2022-04-05 DIAGNOSIS — Z1283 Encounter for screening for malignant neoplasm of skin: Secondary | ICD-10-CM | POA: Diagnosis not present

## 2022-04-05 DIAGNOSIS — D225 Melanocytic nevi of trunk: Secondary | ICD-10-CM | POA: Diagnosis not present

## 2022-04-05 DIAGNOSIS — X32XXXD Exposure to sunlight, subsequent encounter: Secondary | ICD-10-CM | POA: Diagnosis not present

## 2022-04-09 ENCOUNTER — Ambulatory Visit (INDEPENDENT_AMBULATORY_CARE_PROVIDER_SITE_OTHER): Payer: Medicare Other

## 2022-04-09 DIAGNOSIS — Z8674 Personal history of sudden cardiac arrest: Secondary | ICD-10-CM | POA: Diagnosis not present

## 2022-04-13 LAB — CUP PACEART REMOTE DEVICE CHECK
Battery Remaining Longevity: 24 mo
Battery Remaining Percentage: 25 %
Brady Statistic RV Percent Paced: 1 %
Date Time Interrogation Session: 20231010032200
HighPow Impedance: 64 Ohm
Implantable Lead Implant Date: 19990204
Implantable Lead Location: 753860
Implantable Lead Model: 135
Implantable Lead Serial Number: 301213
Implantable Pulse Generator Implant Date: 20111228
Lead Channel Impedance Value: 822 Ohm
Lead Channel Pacing Threshold Amplitude: 0.9 V
Lead Channel Pacing Threshold Pulse Width: 1 ms
Lead Channel Setting Pacing Amplitude: 2.4 V
Lead Channel Setting Pacing Pulse Width: 1 ms
Lead Channel Setting Sensing Sensitivity: 0.4 mV
Pulse Gen Serial Number: 277857

## 2022-04-15 NOTE — Progress Notes (Signed)
Remote ICD transmission.   

## 2022-05-03 DIAGNOSIS — M542 Cervicalgia: Secondary | ICD-10-CM | POA: Diagnosis not present

## 2022-05-03 DIAGNOSIS — M47812 Spondylosis without myelopathy or radiculopathy, cervical region: Secondary | ICD-10-CM | POA: Diagnosis not present

## 2022-05-29 ENCOUNTER — Other Ambulatory Visit: Payer: Self-pay | Admitting: Cardiovascular Disease

## 2022-06-15 ENCOUNTER — Encounter: Payer: Self-pay | Admitting: Neurology

## 2022-06-15 ENCOUNTER — Ambulatory Visit: Payer: Medicare Other | Admitting: Neurology

## 2022-06-15 VITALS — BP 142/89 | HR 78 | Ht 69.0 in | Wt 179.4 lb

## 2022-06-15 DIAGNOSIS — M542 Cervicalgia: Secondary | ICD-10-CM

## 2022-06-15 DIAGNOSIS — R569 Unspecified convulsions: Secondary | ICD-10-CM

## 2022-06-15 DIAGNOSIS — R413 Other amnesia: Secondary | ICD-10-CM | POA: Diagnosis not present

## 2022-06-15 DIAGNOSIS — G931 Anoxic brain damage, not elsewhere classified: Secondary | ICD-10-CM

## 2022-06-15 DIAGNOSIS — G243 Spasmodic torticollis: Secondary | ICD-10-CM

## 2022-06-15 NOTE — Progress Notes (Signed)
ASSESSMENT AND PLAN 71 y.o. year old male    Anoxic brain injury in 1999 due to prolonged cardiac arrest,  With residual mild cognitive impairment  Slow worsening over the years  Continue Aricept 10 mg daily, Namenda 10 mg twice a day  Encouraged moderate exercise  We also discussed intermittent about his driving privilege,he does have slow reaction time, I have limit his driving to short distance daytime only,  Seizure  Most recent in June 2019, EEG was normal  Continue lamotrigine 100 mg twice a day  Gait abnormality, worsening neck pain, limited range of motion, abnormal neck posturing, mild anterocollis, right tilt, significant tenderness along cervical paraspinal muscles, worse on the right side  Personally reviewed CT cervical spine in June 2023, multilevel degenerative changes, severe right-sided C1-C2 osteoarthritis, variable degree of foraminal narrowing, no significant canal stenosis  He failed epidural injection, complains of increased confusion dizziness, unsteadiness with tizanidine, tramadol treatment,  Will try low-dose xeomin injection, preauthorize 200 units, return in 4 weeks for injection  DIAGNOSTIC DATA (LABS, IMAGING, TESTING) - I reviewed patient records, labs, notes, testing and imaging myself where available.   HISTORY OF PRESENT ILLNESS:  Larry [name] is a very friendly 71 year old right-handed gentleman presents for followup consultation of his memory loss in the context of anoxic brain injury.    He is accompanied by his wife today. He was a patient of Dr. Erling Cruz, The patient has an underlying medical history of cardiac death from ventricular fibrillation with hypoxic brain injury in Jan 1999 due to arrythmia, 15 minutes without heart beat,  status post defibrillator placement, He stayed in hospital for 35 days, required prolonged rehabilitation, has to relearn walking again, significant amnesia, "as if that he did not have a past".   EEG showed diffuse  slowing in January 1999, CT head and brain MRI were normal. CT head repeat in November 2000 showed slight prominence of the lateral and third ventricles. CT head in July 2003 showed prominence of the ventricular system. Psychological testing and August 1999 showed borderline performance IQ. He became depressed. In January 2014 his MMSE was 29, clock drawing was 4, animal fluency was 10.  He went to [facility] for 2 years for patient with degenerative disorder.   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 He lives at home with his wife, still driving.   He helps the household, he still has difficulty focusing.    He has had some fluctuation in his memory per wife. He has ADD and continues to take Adderall, 10 mg twice a day, without Adderall, he could not remember  the pages that he has read,he is also taking Aricept 10 mg every day, which has been very helpful too.   Up date April 14 2016YY   His overall doing very well, he likes play computer games, which has helped his eye and hands coordination, he had recent trip to [place], felt overwhelming, he continued to drive short distance,   He is taking Ritalin 10 mg twice a day, Aricept 10 mg once a day, complains of insomnia, he took his last dose of Ritalin at evening time.   UPDATE Apr 21 2015:YY He is now taking Namenda 56m bid and aricept 122mqday, he still has trouble sleeping,  He watch movie, stay up late, he continues to have focus difficulty, tends to become agitated.   UPDATE April 27th 2017: YY He still  has chronic insomnia, tends to have irregular sleep pattern, stay up late watching TV, he still very active during the day, take Adderall 10 mg twice a day, still drives short distance, mild unsteady gait, tends to hold his left arm to his chest   UPDATE Oct 17th 2017:YY He is with his wife,  Her daughter was hospital, presented with sudden loss of consciousness  yesterday April 19 2016, which has brought back a lot of his memory, he has not tried clonazepam for chronic insomnia, worry about side effect, tried melatonin, still has significant insomnia, he is taking Tylenol pm,    UPDATE March 02 2018: He was follow-up by Chrys Racer in 2018, on December 19, 2017, visiting his daughter at [place], in the morning time, he had sudden onset dizziness, feels like going to pass out, then had transient loss of consciousness, eyes rolled back, staring, lasting for 1 minutes, followed by postevent mild confusion and slurred speech last about 40 minutes, was treated at local hospital.   I personally reviewed CT head without contrast: There was no acute abnormality, generalized atrophy, ventriculomegaly,  laboratory evaluation, EKG showed no significant abnormality.   Similar spells in March 2019, transient dizziness followed by sudden loss of muscle tone, consciousness, extreme fatigue, slept for 1 hour afterwards,   Patient reported that he actually has frequent spells about once a month, he has transient confusion, feels like he is going to pass out, short lasting,   UPDATE Jun 05 2018: He tolerated lamotrigine very well, taking 100 mg twice a day without significant side effect,   EEG was normal.  Since lamotrigine, he no longer has recurrent spells of rising sensation transient confusion even though there was no clinical seizure activity observed during those spells, he had those spells about once a week prior to lamotrigine,  UPDATE Jun 19 2020: Is accompanied by his wife at today's clinical visit, slow worsening memory loss, when he goes to a different room, he often forgets why is he there, slight worsening gait abnormality, he also complains of worsening tinnitus, difficulty sleeping sometimes,  I personally reviewed CT temporal bone w/wo in April 2021:  1. Negative CT appearance of the bilateral temporal bones. No explanation for hearing loss. 2. Note a  mildly high riding right jugular bulb, with thin but intact overlying bony covering. 3. Hyperplastic but clear visible paranasal sinuses.  UPDATE March 29th 2022: He used to have headache even before his cardiac arrest, only occasionally, mild, his 2 daughter does has typical migraine headache,  Over the years, he has occasionally headache usually short lasting, on March 26, he complains of transient sharp pain shooting from the left parietal region downward, lasting for few seconds, then had a trickle of pressure headache, take ibuprofen, which did improve  Next morning March 27 Sunday, while at church, at the end of the sermon, he was noted to holding his left side, he also noticed transient blurry vision, was brought to the emergency room,  I personally reviewed CT head with contrast, no acute intracranial abnormality, no venous sinus thrombosis, arterial branch that was visualized showed no significant abnormality  Laboratory showed normal ESR, negative troponin, normal CBC, BMP with exception of elevated glucose 140, was seen by ophthalmology today, no significant abnormality found, was told he has cataract, left side was slightly worse than the right  Update June 14th 2022: I reviewed his headache diary, few episode in March, transient sharp, only 1 episode on October 19, 2020, no longer has  frequent headaches, reported worsening vision bilaterally, left worse than right, on today's near chart examination, visual acuity was 20/50 OS/OD,  Also complains of worsening gait abnormality, wife noticed worsening memory loss,  We personally reviewed CT of temporal bone with without contrast in April 2021 for evaluation of his reported hearing loss, no acute abnormality, CT venogram of brain on September 28, 2020 that was normal  Update July 02, 2021: Is accompanied by his wife, continued slow worsening, increased memory loss, slow reaction time, continue to drive, wife has to be vigilant sitting  at the passenger side, MoCA examination 22/30 today,  Over the past few months, he had few episode of transient ice pricking pain, not long-lasting, He also complains of back pain, rigidity, decreased range of motion  UPDATE December 21 2021: He continues to have slowing memory loss, MoCA examination 25/30, actually improved from previous memory of 22/30, he has slower reaction time, take time to figure out things,  He did have a history of obstructive sleep apnea, tried different facial mask, including nasal cannula in the past, could not tolerate it,  He also complains of worsening neck pain, stiffness, limited range of motion of his neck, somewhat improvement by physical therapy and dry needling, he denies bowel and bladder incontinence  Personally reviewed x-ray of cervical spine, mild to moderate multilevel cervical degenerative disc disease worse at C6-7  UPDATE Jun 15 2022: He is with his wife, complains of worsening short term memory loss, difficulty multitasking, feel frustrated easily,  He complains of worsening neck pain, difficulty turning his neck, 8 out of 10 constant achy pain, worsening shooting pain to right shoulder, occipital region moving his neck, especially looking to the left, underwent epidural injection without help his symptoms much.   REVIEW OF SYSTEMS: Out of a complete 14 system review of symptoms, the patient complains only of the following symptoms, and all other reviewed systems are negative.  Memory loss, seizures  PHYSICAL EXAM  Vitals:   06/15/22 1304  BP: (!) 142/89  Pulse: 78  Weight: 179 lb 6.4 oz (81.4 kg)  Height: _0  (1.753 m)   Body mass index is 26.49 kg/m.  Generalized: Well developed, in no acute distress     06/15/2022    1:06 PM 12/21/2021   12:56 PM 07/02/2021   10:30 AM  Montreal Cognitive Assessment   Visuospatial/ Executive (0/5) _1 Naming (0/3) _2 Attention: Read list of digits (0/2) _3 Attention: Read list of  letters (0/1) _4 Attention: Serial 7 subtraction starting at 100 (0/3) _5 Language: Repeat phrase (0/2) _6 Language : Fluency (0/1) _7 Abstraction (0/2) _8 Delayed Recall (0/5) _9 Orientation (0/6) _10 Total _11 Adjusted Score (based on education) 23       NEUROLOGICAL EXAM:  Neck: Limited range of motion, of neck, looking to either left or right, tenderness of cervical paraspinal muscles upon deep palpation, especially right levator scapula, upper and lower cervical regions, neck tilt to right shoulder, anterocollis,  MENTAL STATUS: Speech/Cognition: Slow reaction time time, cooperative on examination  CRANIAL NERVES: CN II: Visual fields are full to confrontation.  Pupils are small, reactive CN III, IV, VI: extraocular movement are normal. No ptosis. CN V: Facial sensation is intact to light touch. CN VII: Face is symmetric with normal eye closure and smile.  CN VIII: Hearing is normal to casual conversation CN IX, X: Palate elevates symmetrically. Phonation is normal. CN XI: Head turning and shoulder shrug are intact  MOTOR:  Fixation left upper extremity on rapid rotating movement  REFLEXES: Reflexes are 2  and symmetric at the biceps, triceps, knees and ankles. Plantar responses are flexor.  SENSORY: Intact to light touch, pinprick, positional and vibratory sensation at fingers and toes.  COORDINATION: There is no trunk or limb ataxia.    GAIT/STANCE: He needs push-up to get up from seated position, tends to hold his left arm in elbow flexion, mildly wide-based, unsteady   ALLERGIES: Allergies  Allergen Reactions   Penicillins     Difficult Breathing, rash    HOME MEDICATIONS: Outpatient Medications Prior to Visit  Medication Sig Dispense Refill   amphetamine-dextroamphetamine (ADDERALL) 10 MG tablet Take 1 tablet (10 mg total) by mouth 2 (two) times daily. 180 tablet 0   Ascorbic Acid (VITAMIN C) 500 MG tablet Take 500 mg by  mouth daily.     aspirin 81 MG tablet Take 81 mg by mouth daily.     Cholecalciferol (VITAMIN D3) 10 MCG (400 UNIT) tablet Take 400 Units by mouth daily.     diltiazem (CARDIZEM CD) 120 MG 24 hr capsule TAKE 1 CAPSULE BY MOUTH DAILY 90 capsule 1   donepezil (ARICEPT) 10 MG tablet TAKE 1 TABLET BY MOUTH  DAILY 90 tablet 3   fexofenadine (ALLEGRA) 180 MG tablet Take 180 mg by mouth daily.     Ginkgo Biloba 40 MG TABS Take by mouth as directed.     lamoTRIgine (LAMICTAL) 100 MG tablet TAKE 1 TABLET BY MOUTH  TWICE DAILY 180 tablet 3   levothyroxine (SYNTHROID) 137 MCG tablet Take 1 tablet (137 mcg total) by mouth daily before breakfast. 90 tablet 3   Melatonin 5 MG CAPS Take by mouth as directed.     memantine (NAMENDA) 10 MG tablet TAKE 1 TABLET BY MOUTH  TWICE DAILY 180 tablet 3   metoprolol succinate (TOPROL-XL) 25 MG 24 hr tablet TAKE 1 TABLET BY MOUTH ONCE DAILY 100 tablet 2   montelukast (SINGULAIR) 10 MG tablet Take 1 tablet (10 mg total) by mouth daily. 90 tablet 3   Multiple Vitamin (MULTIVITAMIN) tablet Take 700 tablets by mouth daily.     NON FORMULARY Focus factor 1 tab po bid     rosuvastatin (CRESTOR) 20 MG tablet Take 1 tablet (20 mg total) by mouth at bedtime. 90 tablet 3   tiZANidine (ZANAFLEX) 4 MG tablet Take 2-4 mg by mouth 2 (two) times daily as needed.     traMADol (ULTRAM) 50 MG tablet Take 50 mg by mouth 2 (two) times daily as needed.     vitamin B-12 (CYANOCOBALAMIN) 1000 MCG tablet Take 1,000 mcg by mouth daily.     vitamin E 180 MG (400 UNITS) capsule Take 400 Units by mouth daily.     No facility-administered medications prior to visit.    PAST MEDICAL HISTORY: Past Medical History:  Diagnosis Date   ADD (attention deficit disorder)    Allergy    seasonal flares of perinneal allergies   Cancer ([place])    melanoma   Cardiac arrest ([place]) 07/1997   aborted   Dementia ([place])    post resusitative   Depression    Diverticulosis    Family hx of prostate cancer     GERD (gastroesophageal reflux disease)    Headache(784.0)    Hyperlipidemia  Hypothyroidism    ICD (implantable cardiac defibrillator) in place    Idiopathic urticaria    Internal hemorrhoids    Low back pain syndrome    Melanoma in situ of skin of trunk (HCC)    chest   PVC (premature ventricular contraction)    associated with cardiac arrest   Sleep apnea    no c-pap   Tubular adenoma of colon 10/2010    PAST SURGICAL HISTORY: Past Surgical History:  Procedure Laterality Date    implantation x2     CARDIAC DEFIBRILLATOR PLACEMENT  07/01/2010   Explanation of a previously implanted device, pocket revision, and insertion of a new device, and intraoperative defibrillation threshold testing Caryl Comes)   CARDIAC DEFIBRILLATOR REMOVAL  2004   Defibrillator change Caryl Comes) 3 times changed   CATARACT EXTRACTION W/PHACO Left 02/04/2021   Procedure: CATARACT EXTRACTION PHACO AND INTRAOCULAR LENS PLACEMENT ([place]) LEFT VIVITY toric LENS 9.21 01:11.6;  Surgeon: Leandrew Koyanagi, MD;  Location: [facility];  Service: Ophthalmology;  Laterality: Left;  keep patient at 11:30 arrival   CATARACT EXTRACTION W/PHACO Right 03/04/2021   Procedure: CATARACT EXTRACTION PHACO AND INTRAOCULAR LENS PLACEMENT ([place]) RIGHT VIVITY LENS 8.59 01:15.3;  Surgeon: Leandrew Koyanagi, MD;  Location: [facility];  Service: Ophthalmology;  Laterality: Right;   HEMORRHOID SURGERY     ICD     TONSILLECTOMY      FAMILY HISTORY: Family History  Problem Relation Age of Onset   Hypertension Mother    Heart disease Father    Diverticulosis Father    Coronary artery disease Father    Alopecia Father    Prostate cancer Brother    Lung cancer Paternal Grandfather        Smoker   Cancer Paternal Grandfather        lung cancer - smoker   Diabetes Brother    Heart disease Brother        heart transplant   Colon cancer Neg Hx     SOCIAL HISTORY: Social History   Socioeconomic History    Marital status: Married    Spouse name: [name]   Number of children: 2   Years of education: College   Highest education level: Not on file  Occupational History    Employer: DISABLED  Tobacco Use   Smoking status: Former    Packs/day: 1.00    Years: 10.00    Total pack years: 10.00    Types: Cigarettes    Quit date: 07/06/1995    Years since quitting: 26.9   Smokeless tobacco: Former    Types: Chew    Quit date: 11/04/1994  Vaping Use   Vaping Use: Never used  Substance and Sexual Activity   Alcohol use: No    Alcohol/week: 0.0 standard drinks of alcohol    Comment: socially   Drug use: No   Sexual activity: Not on file  Other Topics Concern   Not on file  Social History Narrative   Patient is married [name]) [phone] with 2 daughters   5 grandchildren   Daily caffeine use - one cup of coffee and one-two cokes per day   Patient is right-handed.   Patient has a college education   Social Determinants of Radio broadcast assistant Strain: Low Risk  (10/05/2021)   Overall Financial Resource Strain (CARDIA)    Difficulty of Paying Living Expenses: Not hard at all  Food Insecurity: No Food Insecurity (10/05/2021)   Hunger Vital Sign    Worried About Running  Out of Food in the Last Year: Never true    Ran Out of Food in the Last Year: Never true  Transportation Needs: No Transportation Needs (10/05/2021)   PRAPARE - Hydrologist (Medical): No    Lack of Transportation (Non-Medical): No  Physical Activity: Insufficiently Active (10/05/2021)   Exercise Vital Sign    Days of Exercise per Week: 4 days    Minutes of Exercise per Session: 30 min  Stress: No Stress Concern Present (10/05/2021)   [place]    Feeling of Stress : Not at all  Social Connections: [place] (10/05/2021)   Social Connection and Isolation Panel [NHANES]    Frequency of Communication with Friends and Family: More  than three times a week    Frequency of Social Gatherings with Friends and Family: More than three times a week    Attends Religious Services: More than 4 times per year    Active Member of [employer] or Organizations: Yes    Attends Archivist Meetings: More than 4 times per year    Marital Status: Married  Human resources officer Violence: Not At Risk (10/05/2021)   Humiliation, Afraid, Rape, and Kick questionnaire    Fear of Current or Ex-Partner: No    Emotionally Abused: No    Physically Abused: No    Sexually Abused: No     Larry [name], M.D. Ph.D.  [facility] [place], [place] [address] Phone: [phone] Fax:      [phone]

## 2022-06-21 ENCOUNTER — Ambulatory Visit: Payer: Medicare Other | Admitting: Neurology

## 2022-06-23 ENCOUNTER — Telehealth: Payer: Self-pay

## 2022-06-23 NOTE — Telephone Encounter (Signed)
New PA for Xeomin 200 units [id number] CPT 64616 Dx G24.3.  Thank you.

## 2022-06-24 ENCOUNTER — Other Ambulatory Visit (HOSPITAL_COMMUNITY): Payer: Self-pay

## 2022-06-24 NOTE — Telephone Encounter (Signed)
Patient Advocate Encounter   Received notification that prior authorization for Xeomin 200UNIT solution is required.   PA submitted on 06/24/2022 Key [id number]) Status is pending       Lyndel Safe, [place] Patient Advocate Specialist [place] Patient Advocate Team Direct Number: [phone]  Fax: [phone]

## 2022-06-29 ENCOUNTER — Other Ambulatory Visit (HOSPITAL_COMMUNITY): Payer: Self-pay

## 2022-06-29 NOTE — Telephone Encounter (Signed)
Please try BOTOX 200 units prior authorization

## 2022-06-29 NOTE — Telephone Encounter (Signed)
Pt said since Xeomin was denied. Would like do Botox or Dysport that is covered, if Dr. Krista Blue approves. Would like call from the nurse.

## 2022-06-29 NOTE — Telephone Encounter (Signed)
Patient Advocate Encounter  Prior Authorization for Botox 200UNIT solution has been approved.    PA# [id number] Effective dates: 06/29/2022 through 09/28/2022  Can be filled at [place], [place] Patient [place] Patient Advocate Team Direct Number: [phone]  Fax: [phone]

## 2022-06-29 NOTE — Telephone Encounter (Signed)
Patient Advocate Encounter  Received notification that the request for prior authorization for Xeomin 200UNIT solution  has been denied due to need to try these covered drugs:  Botox or Dysport.       Lyndel Safe, [place] Patient Advocate Specialist [place] Patient Advocate Team Direct Number: [phone]  Fax: [phone]

## 2022-06-29 NOTE — Telephone Encounter (Signed)
Patient Advocate Encounter   Received notification that prior authorization for Botox 200UNIT solution is required.   PA submitted on 06/29/2022 Key [id number] Status is pending       Lyndel Safe, [place] Patient Advocate Specialist [place] Patient Advocate Team Direct Number: [phone]  Fax: [phone]

## 2022-06-30 ENCOUNTER — Other Ambulatory Visit (HOSPITAL_COMMUNITY): Payer: Self-pay

## 2022-06-30 MED ORDER — BOTOX 200 UNITS IJ SOLR
INTRAMUSCULAR | 3 refills | Status: AC
  Filled 2022-06-30: qty 1, fill #0
  Filled 2022-07-08: qty 1, 84d supply, fill #0

## 2022-06-30 NOTE — Addendum Note (Signed)
Addended by: Verlin Grills on: 06/30/2022 08:48 AM   Modules accepted: Orders

## 2022-06-30 NOTE — Telephone Encounter (Signed)
Please call pt to schedule botox appt and pharmacy to schedule shipment.  Thank you.

## 2022-06-30 NOTE — Telephone Encounter (Signed)
Pt scheduled for botox for first available appointment with Dr. Krista Blue which is 08/11/22 at 2:00pm and appointment was added to wait list

## 2022-07-03 ENCOUNTER — Other Ambulatory Visit: Payer: Self-pay | Admitting: Family Medicine

## 2022-07-06 ENCOUNTER — Other Ambulatory Visit (HOSPITAL_COMMUNITY): Payer: Self-pay

## 2022-07-06 NOTE — Telephone Encounter (Signed)
Refill request for AMPHETAMINE SALTS '10MG'$  TAB(GEN ADDERALL)   LOV - 11/05/21 Next OV - not scheduled Last refill - 04/02/22 #180/0

## 2022-07-07 NOTE — Telephone Encounter (Signed)
Sent. Thanks.   

## 2022-07-08 ENCOUNTER — Other Ambulatory Visit (HOSPITAL_COMMUNITY): Payer: Self-pay

## 2022-07-09 ENCOUNTER — Ambulatory Visit (INDEPENDENT_AMBULATORY_CARE_PROVIDER_SITE_OTHER): Payer: Medicare Other

## 2022-07-09 ENCOUNTER — Other Ambulatory Visit (HOSPITAL_COMMUNITY): Payer: Self-pay

## 2022-07-09 DIAGNOSIS — Z8674 Personal history of sudden cardiac arrest: Secondary | ICD-10-CM | POA: Diagnosis not present

## 2022-07-13 LAB — CUP PACEART REMOTE DEVICE CHECK
Battery Remaining Longevity: 18 mo
Battery Remaining Percentage: 22 %
Brady Statistic RV Percent Paced: 1 %
Date Time Interrogation Session: 20240108114600
HighPow Impedance: 67 Ohm
Implantable Lead Connection Status: 753985
Implantable Lead Implant Date: 19990204
Implantable Lead Location: 753860
Implantable Lead Model: 135
Implantable Lead Serial Number: 301213
Implantable Pulse Generator Implant Date: 20111228
Lead Channel Impedance Value: 832 Ohm
Lead Channel Pacing Threshold Amplitude: 0.9 V
Lead Channel Pacing Threshold Pulse Width: 1 ms
Lead Channel Setting Pacing Amplitude: 2.4 V
Lead Channel Setting Pacing Pulse Width: 1 ms
Lead Channel Setting Sensing Sensitivity: 0.4 mV
Pulse Gen Serial Number: 277857

## 2022-07-19 DIAGNOSIS — M47812 Spondylosis without myelopathy or radiculopathy, cervical region: Secondary | ICD-10-CM | POA: Diagnosis not present

## 2022-07-26 NOTE — Progress Notes (Signed)
Remote ICD transmission.   

## 2022-07-27 ENCOUNTER — Encounter: Payer: Medicare Other | Admitting: Internal Medicine

## 2022-08-10 ENCOUNTER — Encounter: Payer: Self-pay | Admitting: Physician Assistant

## 2022-08-11 ENCOUNTER — Telehealth: Payer: Self-pay | Admitting: Neurology

## 2022-08-11 ENCOUNTER — Encounter: Payer: Self-pay | Admitting: Neurology

## 2022-08-11 ENCOUNTER — Ambulatory Visit: Payer: Medicare Other | Admitting: Neurology

## 2022-08-11 VITALS — BP 121/74 | HR 53 | Ht 69.0 in | Wt 176.5 lb

## 2022-08-11 DIAGNOSIS — G243 Spasmodic torticollis: Secondary | ICD-10-CM

## 2022-08-11 MED ORDER — ONABOTULINUMTOXINA 200 UNITS IJ SOLR: 155.0000 [IU] | INTRAMUSCULAR | Status: DC

## 2022-08-11 NOTE — Progress Notes (Signed)
ASSESSMENT AND PLAN 72 y.o. year old male    Anoxic brain injury in 1999 due to prolonged cardiac arrest,  With residual mild cognitive impairment  Slow worsening over the years  Continue Aricept 10 mg daily, Namenda 10 mg twice a day  Encouraged moderate exercise  We also discussed intermittent about his driving privilege,he does have slow reaction time, I have limit his driving to short distance daytime only,  Seizure  Most recent in June 2019, EEG was normal  Continue lamotrigine 100 mg twice a day  Gait abnormality, worsening neck pain, limited range of motion, abnormal neck posturing, mild anterocollis, right tilt, significant tenderness along cervical paraspinal muscles, worse on the right side   CT cervical spine in June 2023, multilevel degenerative changes, severe right-sided C1-C2 osteoarthritis, variable degree of foraminal narrowing, no significant canal stenosis  He failed epidural injection, complains of increased confusion dizziness, unsteadiness with tizanidine, tramadol treatment,    EMG guided Botox injection, used 200 units today (100 units dissolving to 2 cc of normal saline)  Right levator scapular 25 Left levator scapula 25 units  Right longissimus capitis 25 units Right semispinalis 25 units Right splenius capitis 25 units Right splenius cervix 25 units Right iliocostalis 25 units  He tolerated injection well, will return to clinic in 3 months for repeat injection  DIAGNOSTIC DATA (LABS, IMAGING, TESTING) - I reviewed patient records, labs, notes, testing and imaging myself where available.   HISTORY OF PRESENT ILLNESS:  Larry [name] is a very friendly 72 year old right-handed gentleman presents for followup consultation of his memory loss in the context of anoxic brain injury.    He is accompanied by his wife today. He was a patient of Dr. Erling Cruz, The patient has an underlying medical history of cardiac death from ventricular fibrillation with hypoxic  brain injury in Jan 1999 due to arrythmia, 15 minutes without heart beat,  status post defibrillator placement, He stayed in hospital for 35 days, required prolonged rehabilitation, has to relearn walking again, significant amnesia, "as if that he did not have a past".   EEG showed diffuse slowing in January 1999, CT head and brain MRI were normal. CT head repeat in November 2000 showed slight prominence of the lateral and third ventricles. CT head in July 2003 showed prominence of the ventricular system. Psychological testing and August 1999 showed borderline performance IQ. He became depressed. In January 2014 his MMSE was 29, clock drawing was 4, animal fluency was 10.  He went to [facility] for 2 years for patient with degenerative disorder.   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 He lives at home with his wife, still driving.   He helps the household, he still has difficulty focusing.    He has had some fluctuation in his memory per wife. He has ADD and continues to take Adderall, 10 mg twice a day, without Adderall, he could not remember  the pages that he has read,he is also taking Aricept 10 mg every day, which has been very helpful too.   Up date April 14 2016YY   His overall doing very well, he likes play computer games, which has helped his eye and hands coordination, he had recent trip to [place], felt overwhelming, he continued to drive short distance,   He is taking Ritalin 10 mg twice a day, Aricept 10 mg once a day, complains of insomnia, he took his last dose  of Ritalin at evening time.   UPDATE Apr 21 2015:YY He is now taking Namenda '10mg'$  bid and aricept '10mg'$  qday, he still has trouble sleeping,  He watch movie, stay up late, he continues to have focus difficulty, tends to become agitated.   UPDATE April 27th 2017: YY He still has chronic insomnia, tends to have irregular sleep pattern, stay up late  watching TV, he still very active during the day, take Adderall 10 mg twice a day, still drives short distance, mild unsteady gait, tends to hold his left arm to his chest   UPDATE Oct 17th 2017:YY He is with his wife,  Her daughter was hospital, presented with sudden loss of consciousness yesterday April 19 2016, which has brought back a lot of his memory, he has not tried clonazepam for chronic insomnia, worry about side effect, tried melatonin, still has significant insomnia, he is taking Tylenol pm,    UPDATE March 02 2018: He was follow-up by Chrys Racer in 2018, on December 19, 2017, visiting his daughter at [place], in the morning time, he had sudden onset dizziness, feels like going to pass out, then had transient loss of consciousness, eyes rolled back, staring, lasting for 1 minutes, followed by postevent mild confusion and slurred speech last about 40 minutes, was treated at local hospital.   I personally reviewed CT head without contrast: There was no acute abnormality, generalized atrophy, ventriculomegaly,  laboratory evaluation, EKG showed no significant abnormality.   Similar spells in March 2019, transient dizziness followed by sudden loss of muscle tone, consciousness, extreme fatigue, slept for 1 hour afterwards,   Patient reported that he actually has frequent spells about once a month, he has transient confusion, feels like he is going to pass out, short lasting,   UPDATE Jun 05 2018: He tolerated lamotrigine very well, taking 100 mg twice a day without significant side effect,   EEG was normal.  Since lamotrigine, he no longer has recurrent spells of rising sensation transient confusion even though there was no clinical seizure activity observed during those spells, he had those spells about once a week prior to lamotrigine,  UPDATE Jun 19 2020: Is accompanied by his wife at today's clinical visit, slow worsening memory loss, when he goes to a different room, he often forgets why  is he there, slight worsening gait abnormality, he also complains of worsening tinnitus, difficulty sleeping sometimes,  I personally reviewed CT temporal bone w/wo in April 2021:  1. Negative CT appearance of the bilateral temporal bones. No explanation for hearing loss. 2. Note a mildly high riding right jugular bulb, with thin but intact overlying bony covering. 3. Hyperplastic but clear visible paranasal sinuses.  UPDATE March 29th 2022: He used to have headache even before his cardiac arrest, only occasionally, mild, his 2 daughter does has typical migraine headache,  Over the years, he has occasionally headache usually short lasting, on March 26, he complains of transient sharp pain shooting from the left parietal region downward, lasting for few seconds, then had a trickle of pressure headache, take ibuprofen, which did improve  Next morning March 27 Sunday, while at church, at the end of the sermon, he was noted to holding his left side, he also noticed transient blurry vision, was brought to the emergency room,  I personally reviewed CT head with contrast, no acute intracranial abnormality, no venous sinus thrombosis, arterial branch that was visualized showed no significant abnormality  Laboratory showed normal ESR, negative troponin, normal CBC, BMP with  exception of elevated glucose 140, was seen by ophthalmology today, no significant abnormality found, was told he has cataract, left side was slightly worse than the right  Update June 14th 2022: I reviewed his headache diary, few episode in March, transient sharp, only 1 episode on October 19, 2020, no longer has frequent headaches, reported worsening vision bilaterally, left worse than right, on today's near chart examination, visual acuity was 20/50 OS/OD,  Also complains of worsening gait abnormality, wife noticed worsening memory loss,  We personally reviewed CT of temporal bone with without contrast in April 2021 for  evaluation of his reported hearing loss, no acute abnormality, CT venogram of brain on September 28, 2020 that was normal  Update July 02, 2021: Is accompanied by his wife, continued slow worsening, increased memory loss, slow reaction time, continue to drive, wife has to be vigilant sitting at the passenger side, MoCA examination 22/30 today,  Over the past few months, he had few episode of transient ice pricking pain, not long-lasting, He also complains of back pain, rigidity, decreased range of motion  UPDATE December 21 2021: He continues to have slowing memory loss, MoCA examination 25/30, actually improved from previous memory of 22/30, he has slower reaction time, take time to figure out things,  He did have a history of obstructive sleep apnea, tried different facial mask, including nasal cannula in the past, could not tolerate it,  He also complains of worsening neck pain, stiffness, limited range of motion of his neck, somewhat improvement by physical therapy and dry needling, he denies bowel and bladder incontinence  Personally reviewed x-ray of cervical spine, mild to moderate multilevel cervical degenerative disc disease worse at C6-7  UPDATE Jun 15 2022: He is with his wife, complains of worsening short term memory loss, difficulty multitasking, feel frustrated easily,  He complains of worsening neck pain, difficulty turning his neck, 8 out of 10 constant achy pain, worsening shooting pain to right shoulder, occipital region moving his neck, especially looking to the left, underwent epidural injection without help his symptoms much.  UPDATE Aug 11 2022: Here for his first EMG guided Botox a injection for severe neck pain, especially at the right side CT cervical spine June 2023, multilevel degenerative changes, severe right-sided C1-C2 osteoarthritis, tight right cervical paraspinal muscles, tenderness upon deep palpitation,    REVIEW OF SYSTEMS: Out of a complete 14 system  review of symptoms, the patient complains only of the following symptoms, and all other reviewed systems are negative.  Memory loss, seizures  PHYSICAL EXAM  Vitals:   06/15/22 1304  BP: (!) 142/89  Pulse: 78  Weight: 179 lb 6.4 oz (81.4 kg)  Height: '5\' 9"'$  (1.753 m)   Body mass index is 26.49 kg/m.  Generalized: Well developed, in no acute distress     06/15/2022    1:06 PM 12/21/2021   12:56 PM 07/02/2021   10:30 AM  Montreal Cognitive Assessment   Visuospatial/ Executive (0/5) '4 4 3  '$ Naming (0/3) '3 3 3  '$ Attention: Read list of digits (0/2) '2 2 2  '$ Attention: Read list of letters (0/1) '1 1 1  '$ Attention: Serial 7 subtraction starting at 100 (0/3) '3 3 2  '$ Language: Repeat phrase (0/2) '1 1 1  '$ Language : Fluency (0/1) '1 1 1  '$ Abstraction (0/2) '2 2 2  '$ Delayed Recall (0/5) '1 3 2  '$ Orientation (0/6) '5 5 5  '$ Total '23 25 22  '$ Adjusted Score (based on education) 23  NEUROLOGICAL EXAM:  Neck: Limited range of motion, of neck, looking to either left or right, tenderness of cervical paraspinal muscles upon deep palpation, especially right levator scapula, upper and lower cervical regions, neck tilt to right shoulder, anterocollis,  MENTAL STATUS: Speech/Cognition: Slow reaction time time, cooperative on examination  CRANIAL NERVES: CN II: Visual fields are full to confrontation.  Pupils are small, reactive CN III, IV, VI: extraocular movement are normal. No ptosis. CN V: Facial sensation is intact to light touch. CN VII: Face is symmetric with normal eye closure and smile. CN VIII: Hearing is normal to casual conversation CN IX, X: Palate elevates symmetrically. Phonation is normal. CN XI: Head turning and shoulder shrug are intact  MOTOR:  Fixation left upper extremity on rapid rotating movement  REFLEXES: Reflexes are 2  and symmetric at the biceps, triceps, knees and ankles. Plantar responses are flexor.  SENSORY: Intact to light touch, pinprick, positional and  vibratory sensation at fingers and toes.  COORDINATION: There is no trunk or limb ataxia.    GAIT/STANCE: He needs push-up to get up from seated position, tends to hold his left arm in elbow flexion, mildly wide-based, unsteady   ALLERGIES: Allergies  Allergen Reactions   Penicillins     Difficult Breathing, rash    HOME MEDICATIONS: Outpatient Medications Prior to Visit  Medication Sig Dispense Refill   amphetamine-dextroamphetamine (ADDERALL) 10 MG tablet TAKE ONE TABLET BY MOUTH TWICE A DAY 180 tablet 0   Ascorbic Acid (VITAMIN C) 500 MG tablet Take 500 mg by mouth daily.     aspirin 81 MG tablet Take 81 mg by mouth daily.     botulinum toxin Type A (BOTOX) 200 units injection Inject 200 units into the muscles every 3 months 1 each 3   Cholecalciferol (VITAMIN D3) 10 MCG (400 UNIT) tablet Take 400 Units by mouth daily.     diltiazem (CARDIZEM CD) 120 MG 24 hr capsule TAKE 1 CAPSULE BY MOUTH DAILY 90 capsule 1   donepezil (ARICEPT) 10 MG tablet TAKE 1 TABLET BY MOUTH  DAILY 90 tablet 3   fexofenadine (ALLEGRA) 180 MG tablet Take 180 mg by mouth daily.     Ginkgo Biloba 40 MG TABS Take by mouth as directed.     lamoTRIgine (LAMICTAL) 100 MG tablet TAKE 1 TABLET BY MOUTH  TWICE DAILY 180 tablet 3   levothyroxine (SYNTHROID) 137 MCG tablet Take 1 tablet (137 mcg total) by mouth daily before breakfast. 90 tablet 3   Melatonin 5 MG CAPS Take by mouth as directed.     memantine (NAMENDA) 10 MG tablet TAKE 1 TABLET BY MOUTH  TWICE DAILY 180 tablet 3   metoprolol succinate (TOPROL-XL) 25 MG 24 hr tablet TAKE 1 TABLET BY MOUTH ONCE DAILY 100 tablet 2   montelukast (SINGULAIR) 10 MG tablet Take 1 tablet (10 mg total) by mouth daily. 90 tablet 3   Multiple Vitamin (MULTIVITAMIN) tablet Take 700 tablets by mouth daily.     NON FORMULARY Focus factor 1 tab po bid     rosuvastatin (CRESTOR) 20 MG tablet Take 1 tablet (20 mg total) by mouth at bedtime. 90 tablet 3   tiZANidine (ZANAFLEX) 4  MG tablet Take 2-4 mg by mouth 2 (two) times daily as needed.     traMADol (ULTRAM) 50 MG tablet Take 50 mg by mouth 2 (two) times daily as needed.     vitamin B-12 (CYANOCOBALAMIN) 1000 MCG tablet Take 1,000 mcg by mouth daily.  vitamin E 180 MG (400 UNITS) capsule Take 400 Units by mouth daily.     No facility-administered medications prior to visit.    PAST MEDICAL HISTORY: Past Medical History:  Diagnosis Date   ADD (attention deficit disorder)    Allergy    seasonal flares of perinneal allergies   Cancer ([place])    melanoma   Cardiac arrest ([place]) 07/1997   aborted   Dementia ([place])    post resusitative   Depression    Diverticulosis    Family hx of prostate cancer    GERD (gastroesophageal reflux disease)    Headache(784.0)    Hyperlipidemia    Hypothyroidism    ICD (implantable cardiac defibrillator) in place    Idiopathic urticaria    Internal hemorrhoids    Low back pain syndrome    Melanoma in situ of skin of trunk ([place])    chest   PVC (premature ventricular contraction)    associated with cardiac arrest   Sleep apnea    no c-pap   Tubular adenoma of colon 10/2010    PAST SURGICAL HISTORY: Past Surgical History:  Procedure Laterality Date    implantation x2     CARDIAC DEFIBRILLATOR PLACEMENT  07/01/2010   Explanation of a previously implanted device, pocket revision, and insertion of a new device, and intraoperative defibrillation threshold testing Caryl Comes)   CARDIAC DEFIBRILLATOR REMOVAL  2004   Defibrillator change Caryl Comes) 3 times changed   CATARACT EXTRACTION W/PHACO Left 02/04/2021   Procedure: CATARACT EXTRACTION PHACO AND INTRAOCULAR LENS PLACEMENT ([place]) LEFT VIVITY toric LENS 9.21 01:11.6;  Surgeon: Leandrew Koyanagi, MD;  Location: [facility];  Service: Ophthalmology;  Laterality: Left;  keep patient at 11:30 arrival   CATARACT EXTRACTION W/PHACO Right 03/04/2021   Procedure: CATARACT EXTRACTION PHACO AND INTRAOCULAR LENS PLACEMENT ([place])  RIGHT VIVITY LENS 8.59 01:15.3;  Surgeon: Leandrew Koyanagi, MD;  Location: [facility];  Service: Ophthalmology;  Laterality: Right;   HEMORRHOID SURGERY     ICD     TONSILLECTOMY      FAMILY HISTORY: Family History  Problem Relation Age of Onset   Hypertension Mother    Heart disease Father    Diverticulosis Father    Coronary artery disease Father    Alopecia Father    Prostate cancer Brother    Lung cancer Paternal Grandfather        Smoker   Cancer Paternal Grandfather        lung cancer - smoker   Diabetes Brother    Heart disease Brother        heart transplant   Colon cancer Neg Hx     SOCIAL HISTORY: Social History   Socioeconomic History   Marital status: Married    Spouse name: [name]   Number of children: 2   Years of education: College   Highest education level: Not on file  Occupational History    Employer: DISABLED  Tobacco Use   Smoking status: Former    Packs/day: 1.00    Years: 10.00    Total pack years: 10.00    Types: Cigarettes    Quit date: 07/06/1995    Years since quitting: 27.1   Smokeless tobacco: Former    Types: Chew    Quit date: 11/04/1994  Vaping Use   Vaping Use: Never used  Substance and Sexual Activity   Alcohol use: No    Alcohol/week: 0.0 standard drinks of alcohol    Comment: socially   Drug use: No  Sexual activity: Not on file  Other Topics Concern   Not on file  Social History Narrative   Patient is married [name]) [phone] with 2 daughters   5 grandchildren   Daily caffeine use - one cup of coffee and one-two cokes per day   Patient is right-handed.   Patient has a college education   Social Determinants of Radio broadcast assistant Strain: Low Risk  (10/05/2021)   Overall Financial Resource Strain (CARDIA)    Difficulty of Paying Living Expenses: Not hard at all  Food Insecurity: No Food Insecurity (10/05/2021)   Hunger Vital Sign    Worried About Running Out of Food in the Last Year: Never true    Ran  Out of Food in the Last Year: Never true  Transportation Needs: No Transportation Needs (10/05/2021)   PRAPARE - Hydrologist (Medical): No    Lack of Transportation (Non-Medical): No  Physical Activity: Insufficiently Active (10/05/2021)   Exercise Vital Sign    Days of Exercise per Week: 4 days    Minutes of Exercise per Session: 30 min  Stress: No Stress Concern Present (10/05/2021)   [place]    Feeling of Stress : Not at all  Social Connections: [place] (10/05/2021)   Social Connection and Isolation Panel [NHANES]    Frequency of Communication with Friends and Family: More than three times a week    Frequency of Social Gatherings with Friends and Family: More than three times a week    Attends Religious Services: More than 4 times per year    Active Member of [employer] or Organizations: Yes    Attends Archivist Meetings: More than 4 times per year    Marital Status: Married  Human resources officer Violence: Not At Risk (10/05/2021)   Humiliation, Afraid, Rape, and Kick questionnaire    Fear of Current or Ex-Partner: No    Emotionally Abused: No    Physically Abused: No    Sexually Abused: No     Marcial Pacas, M.D. Ph.D.  [facility] [place], [place] [address] Phone: [phone] Fax:      [phone]

## 2022-08-11 NOTE — Progress Notes (Signed)
Botox 200 units x 1 vial  LOT#: [id number] EXP: 10/2024 NDC: 2883-3744-51  SP

## 2022-08-11 NOTE — Telephone Encounter (Signed)
Got it, please call patient to explain the situation

## 2022-08-11 NOTE — Telephone Encounter (Signed)
Please do not ask specialty pharmacy for next injection in May 2024, instead using buy and bill Botox a 200 units  I will evaluate patient first before dissolve the toxin,

## 2022-08-13 ENCOUNTER — Other Ambulatory Visit: Payer: Self-pay | Admitting: Cardiovascular Disease

## 2022-08-16 ENCOUNTER — Other Ambulatory Visit: Payer: Self-pay | Admitting: Neurology

## 2022-08-16 DIAGNOSIS — R413 Other amnesia: Secondary | ICD-10-CM

## 2022-08-17 ENCOUNTER — Ambulatory Visit: Payer: Medicare Other | Attending: Internal Medicine | Admitting: Internal Medicine

## 2022-08-17 ENCOUNTER — Encounter: Payer: Self-pay | Admitting: Internal Medicine

## 2022-08-17 VITALS — BP 128/82 | HR 99 | Ht 69.0 in | Wt 177.4 lb

## 2022-08-17 DIAGNOSIS — I4729 Other ventricular tachycardia: Secondary | ICD-10-CM

## 2022-08-17 DIAGNOSIS — I10 Essential (primary) hypertension: Secondary | ICD-10-CM

## 2022-08-17 NOTE — Patient Instructions (Signed)
Medication Instructions:  Your physician recommends that you continue on your current medications as directed. Please refer to the Current Medication list given to you today.  *If you need a refill on your cardiac medications before your next appointment, please call your pharmacy*   Lab Work: - None ordered If you have labs (blood work) drawn today and your tests are completely normal, you will receive your results only by: [place] (if you have MyChart) OR A paper copy in the mail If you have any lab test that is abnormal or we need to change your treatment, we will call you to review the results.   Testing/Procedures: - None ordered   Follow-Up: At [facility], you and your health needs are our priority.  As part of our continuing mission to provide you with exceptional heart care, we have created designated Provider Care Teams.  These Care Teams include your primary Cardiologist (physician) and Advanced Practice Providers (APPs -  Physician Assistants and Nurse Practitioners) who all work together to provide you with the care you need, when you need it.  We recommend signing up for the patient portal called "MyChart".  Sign up information is provided on this After Visit Summary.  MyChart is used to connect with patients for Virtual Visits (Telemedicine).  Patients are able to view lab/test results, encounter notes, upcoming appointments, etc.  Non-urgent messages can be sent to your provider as well.   To learn more about what you can do with MyChart, go to [email or web address].    Your next appointment:   1 year(s)  Provider:   Virl Axe, MD    Other Instructions - None

## 2022-08-17 NOTE — Progress Notes (Signed)
Patient Care Team: [name], MD as PCP - General (Family Medicine) Deboraha Sprang, MD as PCP - Electrophysiology (Cardiology) Sherren Mocha, MD as PCP - Cardiology (Cardiology) Leandrew Koyanagi, MD as Referring Physician (Ophthalmology)   HPI  Larry [name] is a 72 y.o. male seen in followup for aborted cardiac arrest 1999 that occurred in the context of PVCs for which he was being treated with Propafenone.  ICD generator replacement  Dec 2011 .  Long term CCB and BB for tachycardia described in the note from 2011 but I cannot find the tracings.  The patient denies chest pain, shortness of breath, nocturnal dyspnea, orthopnea or peripheral edema.  There have been no palpitations, lightheadedness or syncope.    Was up on the roof and slipped.  Thankfully does not hurt   Date Cr K TSH Hgb  12/20 1.0 4.0 6.29 14.6  4/22 1.08 4.1 5.94 (6/22) 14.9  4/23 0.93 4.2  14.1     Past Medical History:  Diagnosis Date   ADD (attention deficit disorder)    Allergy    seasonal flares of perinneal allergies   Cancer ([place])    melanoma   Cardiac arrest ([place]) 07/1997   aborted   Dementia ([place])    post resusitative   Depression    Diverticulosis    Family hx of prostate cancer    GERD (gastroesophageal reflux disease)    Headache(784.0)    Hyperlipidemia    Hypothyroidism    ICD (implantable cardiac defibrillator) in place    Idiopathic urticaria    Internal hemorrhoids    Low back pain syndrome    Melanoma in situ of skin of trunk ([place])    chest   PVC (premature ventricular contraction)    associated with cardiac arrest   Sleep apnea    no c-pap   Tubular adenoma of colon 10/2010    Past Surgical History:  Procedure Laterality Date    implantation x2     CARDIAC DEFIBRILLATOR PLACEMENT  07/01/2010   Explanation of a previously implanted device, pocket revision, and insertion of a new device, and intraoperative defibrillation threshold testing Caryl Comes)    CARDIAC DEFIBRILLATOR REMOVAL  2004   Defibrillator change Caryl Comes) 3 times changed   CATARACT EXTRACTION W/PHACO Left 02/04/2021   Procedure: CATARACT EXTRACTION PHACO AND INTRAOCULAR LENS PLACEMENT ([place]) LEFT VIVITY toric LENS 9.21 01:11.6;  Surgeon: Leandrew Koyanagi, MD;  Location: [facility];  Service: Ophthalmology;  Laterality: Left;  keep patient at 11:30 arrival   CATARACT EXTRACTION W/PHACO Right 03/04/2021   Procedure: CATARACT EXTRACTION PHACO AND INTRAOCULAR LENS PLACEMENT ([place]) RIGHT VIVITY LENS 8.59 01:15.3;  Surgeon: Leandrew Koyanagi, MD;  Location: [facility];  Service: Ophthalmology;  Laterality: Right;   HEMORRHOID SURGERY     ICD     TONSILLECTOMY      Current Outpatient Medications  Medication Sig Dispense Refill   amphetamine-dextroamphetamine (ADDERALL) 10 MG tablet TAKE ONE TABLET BY MOUTH TWICE A DAY 180 tablet 0   Ascorbic Acid (VITAMIN C) 500 MG tablet Take 500 mg by mouth daily.     aspirin 81 MG tablet Take 81 mg by mouth daily.     botulinum toxin Type A (BOTOX) 200 units injection Inject 200 units into the muscles every 3 months 1 each 3   Cholecalciferol (VITAMIN D3) 10 MCG (400 UNIT) tablet Take 400 Units by mouth daily.     diltiazem (CARDIZEM CD) 120 MG 24 hr capsule TAKE 1 CAPSULE BY  MOUTH DAILY 90 capsule 1   donepezil (ARICEPT) 10 MG tablet TAKE 1 TABLET BY MOUTH  DAILY 90 tablet 3   fexofenadine (ALLEGRA) 180 MG tablet Take 180 mg by mouth daily.     Ginkgo Biloba 40 MG TABS Take by mouth as directed.     lamoTRIgine (LAMICTAL) 100 MG tablet TAKE 1 TABLET BY MOUTH  TWICE DAILY 180 tablet 3   levothyroxine (SYNTHROID) 137 MCG tablet Take 1 tablet (137 mcg total) by mouth daily before breakfast. 90 tablet 3   Melatonin 5 MG CAPS Take by mouth as directed.     memantine (NAMENDA) 10 MG tablet TAKE 1 TABLET BY MOUTH  TWICE DAILY 180 tablet 3   metoprolol succinate (TOPROL-XL) 25 MG 24 hr tablet TAKE 1 TABLET BY MOUTH ONCE DAILY 100  tablet 2   montelukast (SINGULAIR) 10 MG tablet Take 1 tablet (10 mg total) by mouth daily. 90 tablet 3   Multiple Vitamin (MULTIVITAMIN) tablet Take 700 tablets by mouth daily.     NON FORMULARY Focus factor 1 tab po bid     rosuvastatin (CRESTOR) 20 MG tablet Take 1 tablet (20 mg total) by mouth at bedtime. 90 tablet 3   tiZANidine (ZANAFLEX) 4 MG tablet Take 2-4 mg by mouth 2 (two) times daily as needed.     traMADol (ULTRAM) 50 MG tablet Take 50 mg by mouth 2 (two) times daily as needed.     vitamin B-12 (CYANOCOBALAMIN) 1000 MCG tablet Take 1,000 mcg by mouth daily.     vitamin E 180 MG (400 UNITS) capsule Take 400 Units by mouth daily.     Current Facility-Administered Medications  Medication Dose Route Frequency Provider Last Rate Last Admin   botulinum toxin Type A (BOTOX) injection 155 Units  155 Units Intramuscular Q90 days Marcial Pacas, MD        Allergies  Allergen Reactions   Penicillins     Difficult Breathing, rash    Review of Systems negative except from HPI and PMH  Physical Exam BP 128/82   Pulse 99   Ht 5' 9"$  (1.753 m)   Wt 177 lb 6.4 oz (80.5 kg)   SpO2 98%   BMI 26.20 kg/m  Well developed and well nourished in no acute distress HENT normal Neck supple with JVP-flat Clear Device pocket well healed; without hematoma or erythema.  There is no tethering  Regular rate and rhythm, no  murmur Abd-soft with active BS No Clubbing cyanosis  edema Skin-warm and dry A & Oriented  Grossly normal sensory and motor function  ECG sinus at 99 Intervals 14/09/35 heart rate histograms less than 8% of his heartbeats are faster 100 not withstanding resting rate today  Device function is normal. Programming changes none See Paceart for details     Assessment and  Plan Aborted Cardiac Arrest  Hypertension     PVCs  Implantable defibrillator-[employer]    Device is approaching ERI  Blood pressure is well-controlled on the combination of metoprolol and  diltiazem.

## 2022-08-30 ENCOUNTER — Other Ambulatory Visit: Payer: Self-pay | Admitting: Neurology

## 2022-08-30 DIAGNOSIS — R413 Other amnesia: Secondary | ICD-10-CM

## 2022-09-01 ENCOUNTER — Other Ambulatory Visit: Payer: Self-pay | Admitting: Family Medicine

## 2022-09-01 DIAGNOSIS — E7849 Other hyperlipidemia: Secondary | ICD-10-CM

## 2022-09-01 DIAGNOSIS — I10 Essential (primary) hypertension: Secondary | ICD-10-CM

## 2022-09-02 NOTE — Telephone Encounter (Signed)
LVM for patient to call and schedule

## 2022-09-02 NOTE — Telephone Encounter (Signed)
Patient needs medicare wellness scheduled with Dr. Damita Dunnings after wellness nurse visit in April. Please call to schedule.

## 2022-09-03 NOTE — Telephone Encounter (Signed)
Lvmtcb, sent mychart message

## 2022-09-08 NOTE — Telephone Encounter (Signed)
Patient has been scheduled

## 2022-09-08 NOTE — Progress Notes (Addendum)
09/09/2022 Larry [name] [id number] [birth year]  Referring provider: [name], MD Primary GI doctor: Dr. Fuller Plan  ASSESSMENT AND PLAN:   Diarrhea with AB bloating last 1.5 months, had fecal incontinence/nocturnal symptoms Seems to be improving, possible post infectious IBS at this time Due for colonoscopy 11/2022, will schedule in 1 to 2 months to give adequate time for Diatherix to return Did diatherix in the office to rule out Cdiff/GI pathogen ADDENDUM 09/13/2022 Diatherix negative for Cdiff/GI pathogen. Continue to colonoscopy.  Add on align Check CBC, CMET, thyroid, celiac, sed rate FODMAP diet given.  We have discussed the risks of bleeding, infection, perforation, medication reactions, and remote risk of death associated with colonoscopy. All questions were answered and the patient acknowledges these risk and wishes to proceed.  History of adenomatous polyp of colon 11/24/2015 Colonoscopy Dr. Fuller Plan good bowel prep moderate diverticulosis sigmoid colon, internal hemorrhoids, recall 5 years due to personal history of adenomatous polyps  However due to new guidelines it was 7 years.   Abdominal bloating Check CBC, c-Met, sed rate, celiac Add on align FODMAP diet given   Patient Care Team: [name], MD as PCP - General (Family Medicine) Deboraha Sprang, MD as PCP - Electrophysiology (Cardiology) Sherren Mocha, MD as PCP - Cardiology (Cardiology) Leandrew Koyanagi, MD as Referring Physician (Ophthalmology)  HISTORY OF PRESENT ILLNESS: 72 y.o. male with a past medical history of history of cardiac arrest 1999 context of PVCs being treated, AICD December 2011, follows with Dr. Caryl Comes, GERD, history of melanoma, history of  adenomatous polyps and others listed below presents for evaluation of abnormal stool.   11/24/2015 Colonoscopy Dr. Fuller Plan good bowel prep moderate diverticulosis sigmoid colon, internal hemorrhoids, recall 5 years due to personal  history of adenomatous polyps  However due to new guidelines it was 7 years. ( 11/2022)  Labs reviewed from 10/27/2021 showed Hgb 14.1, normal platelets, no leukocytosis.   Kidney and liver function unremarkable. No recent labs.   [name] is his wife and here with him, provides some of the history.  Started about 1-2 months ago, has been having alternating diarrhea/constipation with significant gas.  He has BM daily or up to 3-4 a day, formed stools to soft blobs.  Has had some urgency. Has had fecal incontinence in last 6 months 3-4 times, small volume, smear on undies. No urinary incontinence or lower back.  Has had significant bloating and increase flatulance.  No nocturnal symptoms. No fever, chills.  In last 4-6 months has had about 7-8 lb weight loss.  No issues with appetite.  He is on tramadol and zanaflex l09/2023 added for cervical DDD. No other new meds or supplements. No ABX in last 6 months.  He does take goody powders with HA once a month, ibuprofen 2-3 months daily but since Sept very sporadic.  No melena, no hematochezia.  Denies GERD, nausea, vomiting.   2 weeks ago had milk shake from cook out and both him and his wife had diarrhea at night.   Wt Readings from Last 5 Encounters:  09/09/22 178 lb (80.7 kg)  08/17/22 177 lb 6.4 oz (80.5 kg)  08/11/22 176 lb 8 oz (80.1 kg)  06/15/22 179 lb 6.4 oz (81.4 kg)  12/21/21 185 lb (83.9 kg)     He  reports that he quit smoking about 27 years ago. His smoking use included cigarettes. He has a 10.00 pack-year smoking history. He quit smokeless tobacco use about 27 years ago.  His smokeless tobacco use included chew. He reports that he does not drink alcohol and does not use drugs.  RELEVANT LABS AND IMAGING: CBC    Component Value Date/Time   WBC 7.5 10/27/2021 0807   RBC 4.52 10/27/2021 0807   HGB 14.1 10/27/2021 0807   HGB 14.6 06/12/2019 1015   HCT 42.0 10/27/2021 0807   HCT 41.5 06/12/2019 1015   PLT 348.0 10/27/2021  0807   PLT 310 06/12/2019 1015   MCV 93.0 10/27/2021 0807   MCV 91 06/12/2019 1015   MCH 31.4 09/28/2020 1534   MCHC 33.6 10/27/2021 0807   RDW 13.0 10/27/2021 0807   RDW 12.1 06/12/2019 1015   LYMPHSABS 3.0 10/27/2021 0807   LYMPHSABS 2.5 06/12/2019 1015   MONOABS 0.7 10/27/2021 0807   EOSABS 0.2 10/27/2021 0807   EOSABS 0.1 06/12/2019 1015   BASOSABS 0.1 10/27/2021 0807   BASOSABS 0.0 06/12/2019 1015   Recent Labs    10/27/21 0807  HGB 14.1     CMP     Component Value Date/Time   NA 137 10/27/2021 0807   NA 140 06/12/2019 1015   K 4.2 10/27/2021 0807   CL 101 10/27/2021 0807   CO2 30 10/27/2021 0807   GLUCOSE 94 10/27/2021 0807   BUN 12 10/27/2021 0807   BUN 13 06/12/2019 1015   CREATININE 0.93 10/27/2021 0807   CREATININE 1.07 12/23/2017 1451   CALCIUM 9.5 10/27/2021 0807   PROT 6.9 10/27/2021 0807   PROT 6.6 06/12/2019 1015   ALBUMIN 4.5 10/27/2021 0807   ALBUMIN 4.4 06/12/2019 1015   AST 24 10/27/2021 0807   ALT 30 10/27/2021 0807   ALKPHOS 80 10/27/2021 0807   BILITOT 0.5 10/27/2021 0807   BILITOT 0.3 06/12/2019 1015   GFRNONAA >60 09/28/2020 1534   GFRAA 101 06/12/2019 1015      Latest Ref Rng & Units 10/27/2021    8:07 AM 10/03/2020    8:04 AM 06/12/2019   10:15 AM  Hepatic Function  Total Protein 6.0 - 8.3 g/dL 6.9  6.9  6.6   Albumin 3.5 - 5.2 g/dL 4.5  4.5  4.4   AST 0 - 37 U/L 24  23  32   ALT 0 - 53 U/L 30  30  40   Alk Phosphatase 39 - 117 U/L 80  72  102   Total Bilirubin 0.2 - 1.2 mg/dL 0.5  0.6  0.3       Current Medications:   Current Outpatient Medications (Endocrine & Metabolic):    levothyroxine (SYNTHROID) 137 MCG tablet, Take 1 tablet (137 mcg total) by mouth daily before breakfast.   Current Outpatient Medications (Cardiovascular):    diltiazem (CARDIZEM CD) 120 MG 24 hr capsule, TAKE 1 CAPSULE BY MOUTH DAILY   metoprolol succinate (TOPROL-XL) 25 MG 24 hr tablet, TAKE 1 TABLET BY MOUTH ONCE DAILY   rosuvastatin (CRESTOR) 20  MG tablet, TAKE 1 TABLET BY MOUTH AT  BEDTIME   Current Outpatient Medications (Respiratory):    fexofenadine (ALLEGRA) 180 MG tablet, Take 180 mg by mouth daily.   montelukast (SINGULAIR) 10 MG tablet, TAKE 1 TABLET BY MOUTH DAILY   Current Outpatient Medications (Analgesics):    aspirin 81 MG tablet, Take 81 mg by mouth daily.   traMADol (ULTRAM) 50 MG tablet, Take 50 mg by mouth 2 (two) times daily as needed.   Current Outpatient Medications (Hematological):    vitamin B-12 (CYANOCOBALAMIN) 1000 MCG tablet, Take 1,000 mcg by mouth daily.  Current Outpatient Medications (Other):    amphetamine-dextroamphetamine (ADDERALL) 10 MG tablet, TAKE ONE TABLET BY MOUTH TWICE A DAY   Ascorbic Acid (VITAMIN C) 500 MG tablet, Take 500 mg by mouth daily.   botulinum toxin Type A (BOTOX) 200 units injection, Inject 200 units into the muscles every 3 months   Cholecalciferol (VITAMIN D3) 10 MCG (400 UNIT) tablet, Take 400 Units by mouth daily.   donepezil (ARICEPT) 10 MG tablet, TAKE 1 TABLET BY MOUTH  DAILY   Ginkgo Biloba 40 MG TABS, Take by mouth as directed.   lamoTRIgine (LAMICTAL) 100 MG tablet, TAKE 1 TABLET BY MOUTH  TWICE DAILY   Melatonin 5 MG CAPS, Take by mouth as directed.   memantine (NAMENDA) 10 MG tablet, TAKE 1 TABLET BY MOUTH  TWICE DAILY   Multiple Vitamin (MULTIVITAMIN) tablet, Take 700 tablets by mouth daily.   NON FORMULARY, Focus factor 1 tab po bid   tiZANidine (ZANAFLEX) 4 MG tablet, Take 2-4 mg by mouth 2 (two) times daily as needed.   vitamin E 180 MG (400 UNITS) capsule, Take 400 Units by mouth daily.  Current Facility-Administered Medications (Other):    botulinum toxin Type A (BOTOX) injection 155 Units  Medical History:  Past Medical History:  Diagnosis Date   ADD (attention deficit disorder)    Allergy    seasonal flares of perinneal allergies   Cancer ([place])    melanoma   Cardiac arrest ([place]) 07/1997   aborted   Dementia ([place])    post resusitative    Depression    Diverticulosis    Family hx of prostate cancer    GERD (gastroesophageal reflux disease)    Headache(784.0)    Hyperlipidemia    Hypothyroidism    ICD (implantable cardiac defibrillator) in place    Idiopathic urticaria    Internal hemorrhoids    Low back pain syndrome    Melanoma in situ of skin of trunk ([facility])    chest   PVC (premature ventricular contraction)    associated with cardiac arrest   Sleep apnea    no c-pap   Tubular adenoma of colon 10/2010   Allergies:  Allergies  Allergen Reactions   Penicillins     Difficult Breathing, rash     Surgical History:  He  has a past surgical history that includes Hemorrhoid surgery;  implantation x2; ICD; Cardiac defibrillator removal (07/05/2002); Tonsillectomy; Cardiac defibrillator placement (07/01/2010); Cataract extraction w/PHACO (Left, 02/04/2021); Cataract extraction w/PHACO (Right, 03/04/2021); and Colonoscopy. Family History:  His family history includes Alopecia in his father; Cancer in his paternal grandfather; Coronary artery disease in his father; Diabetes in his brother; Diverticulosis in his father; Heart disease in his brother, father, and mother; Hypertension in his mother; Lung cancer in his paternal grandfather; Prostate cancer in his brother.  REVIEW OF SYSTEMS  : All other systems reviewed and negative except where noted in the History of Present Illness.  PHYSICAL EXAM: BP 122/78   Pulse 90   Ht '5\' 9"'$  (1.753 m)   Wt 178 lb (80.7 kg)   SpO2 96%   BMI 26.29 kg/m  General Appearance: Well nourished, in no apparent distress. Head:   Normocephalic and atraumatic. Eyes:  sclerae anicteric,conjunctive pink  Respiratory: Respiratory effort normal, BS equal bilaterally without rales, rhonchi, wheezing. Cardio: RRR with no MRGs. Peripheral pulses intact.  Abdomen: Soft,  Obese ,active bowel sounds. No tenderness .Marland Kitchen No masses. Rectal: Not evaluated Musculoskeletal: Full ROM, Shuffling gait, able to  get on  table. Without edema. Skin:  Dry and intact without significant lesions or rashes Neuro: Alert and  oriented x4;  No focal deficits. Psych:  Cooperative. Normal mood and flat affect.    Vladimir Crofts, PA-C 11:09 AM

## 2022-09-09 ENCOUNTER — Ambulatory Visit: Payer: Medicare Other | Admitting: Physician Assistant

## 2022-09-09 ENCOUNTER — Other Ambulatory Visit (INDEPENDENT_AMBULATORY_CARE_PROVIDER_SITE_OTHER): Payer: Medicare Other

## 2022-09-09 ENCOUNTER — Encounter: Payer: Self-pay | Admitting: Physician Assistant

## 2022-09-09 VITALS — BP 122/78 | HR 90 | Ht 69.0 in | Wt 178.0 lb

## 2022-09-09 DIAGNOSIS — A09 Infectious gastroenteritis and colitis, unspecified: Secondary | ICD-10-CM

## 2022-09-09 DIAGNOSIS — R140 Abdominal distension (gaseous): Secondary | ICD-10-CM

## 2022-09-09 DIAGNOSIS — R197 Diarrhea, unspecified: Secondary | ICD-10-CM

## 2022-09-09 DIAGNOSIS — Z86010 Personal history of colonic polyps: Secondary | ICD-10-CM | POA: Diagnosis not present

## 2022-09-09 DIAGNOSIS — Z860101 Personal history of adenomatous and serrated colon polyps: Secondary | ICD-10-CM

## 2022-09-09 LAB — COMPREHENSIVE METABOLIC PANEL
ALT: 22 U/L (ref 0–53)
AST: 21 U/L (ref 0–37)
Albumin: 4.1 g/dL (ref 3.5–5.2)
Alkaline Phosphatase: 71 U/L (ref 39–117)
BUN: 14 mg/dL (ref 6–23)
CO2: 26 mEq/L (ref 19–32)
Calcium: 9.7 mg/dL (ref 8.4–10.5)
Chloride: 101 mEq/L (ref 96–112)
Creatinine, Ser: 0.91 mg/dL (ref 0.40–1.50)
GFR: 85.61 mL/min (ref >60.00)
Glucose, Bld: 114 mg/dL — ABNORMAL HIGH (ref 70–99)
Potassium: 4 mEq/L (ref 3.5–5.1)
Sodium: 136 mEq/L (ref 135–145)
Total Bilirubin: 0.4 mg/dL (ref 0.2–1.2)
Total Protein: 7 g/dL (ref 6.0–8.3)

## 2022-09-09 LAB — CBC WITH DIFFERENTIAL/PLATELET
Basophils Absolute: 0.1 10*3/uL (ref 0.0–0.1)
Basophils Relative: 0.9 % (ref 0.0–3.0)
Eosinophils Absolute: 0.2 10*3/uL (ref 0.0–0.7)
Eosinophils Relative: 2.7 % (ref 0.0–5.0)
HCT: 42.7 % (ref 39.0–52.0)
Hemoglobin: 14.5 g/dL (ref 13.0–17.0)
Lymphocytes Relative: 33.5 % (ref 12.0–46.0)
Lymphs Abs: 2.4 10*3/uL (ref 0.7–4.0)
MCHC: 33.9 g/dL (ref 30.0–36.0)
MCV: 91.6 fl (ref 78.0–100.0)
Monocytes Absolute: 0.8 10*3/uL (ref 0.1–1.0)
Monocytes Relative: 11.3 % (ref 3.0–12.0)
Neutro Abs: 3.7 10*3/uL (ref 1.4–7.7)
Neutrophils Relative %: 51.6 % (ref 43.0–77.0)
Platelets: 350 10*3/uL (ref 150.0–400.0)
RBC: 4.66 Mil/uL (ref 4.22–5.81)
RDW: 13.2 % (ref 11.5–15.5)
WBC: 7.2 10*3/uL (ref 4.0–10.5)

## 2022-09-09 LAB — SEDIMENTATION RATE: Sed Rate: 9 mm/hr (ref 0–20)

## 2022-09-09 LAB — TSH: TSH: 3.25 u[IU]/mL (ref 0.35–5.50)

## 2022-09-09 MED ORDER — NA SULFATE-K SULFATE-MG SULF 17.5-3.13-1.6 GM/177ML PO SOLN: ORAL | 0 refills | Status: DC

## 2022-09-09 NOTE — Patient Instructions (Addendum)
You have been scheduled for a colonoscopy. Please follow written instructions given to you at your visit today.  Please pick up your prep supplies at the pharmacy within the next 1-3 days. If you use inhalers (even only as needed), please bring them with you on the day of your procedure.   We are ordering a "Diatherix" stool testing for you to evaluate for infection You have received a kit from our office today containing all necessary supplies to complete this test.  Please carefully read the stool collection instructions provided in the kit before opening the accompanying materials.   Important to remember: -Place the label from the top left corner of the laboratory request sheet onto the "puritan opti-swab" TUBE. -This label should include your full name and date of birth.  -After completing the test, you should secure the tube into the specimen biohazard bag.  -The laboratory request information sheet (including date and time of specimen collection) should be placed into the outside pocket of the specimen biohazard bag and returned to the [facility] lab with 2 days of collection.   If it is greater than two days from collection you will be asked to repeat the test.  If the label is missing from the tube with your name and date of birth on it, you will have to repeat the test.  Any questions please message [place] on my chart or call the office at [phone]  Your provider has requested that you go to the basement level for lab work before leaving today. Press "B" on the elevator. The lab is located at the first door on the left as you exit the elevator.  If your blood pressure at your visit was 140/90 or greater, please contact your primary care physician to follow up on this.   If you are age 70 or older, your body mass index should be between 23-30. Your Body mass index is 26.29 kg/m. If this is out of the aforementioned range listed, please consider follow up with your Primary Care  Provider.  If you are age 18 or younger, your body mass index should be between 19-25. Your Body mass index is 26.29 kg/m. If this is out of the aformentioned range listed, please consider follow up with your Primary Care Provider.    The [facility] providers would like to encourage you to use [facility] to communicate with providers for non-urgent requests or questions.  Due to long hold times on the telephone, sending your provider a message by [facility] may be a faster and more efficient way to get a response.  Please allow 48 business hours for a response.  Please remember that this is for non-urgent requests.   Due to recent changes in healthcare laws, you may see the results of your imaging and laboratory studies on MyChart before your provider has had a chance to review them.  We understand that in some cases there may be results that are confusing or concerning to you. Not all laboratory results come back in the same time frame and the provider may be waiting for multiple results in order to interpret others.  Please give [place] 48 hours in order for your provider to thoroughly review all the results before contacting the office for clarification of your results.    Thank you for entrusting me with your care and choosing [facility].  Vicie Mutters PA-C

## 2022-09-10 ENCOUNTER — Encounter: Payer: Self-pay | Admitting: Internal Medicine

## 2022-09-10 LAB — IGA: Immunoglobulin A: 185 mg/dL (ref 70–320)

## 2022-09-10 LAB — TISSUE TRANSGLUTAMINASE, IGA: (tTG) Ab, IgA: <1 U/mL

## 2022-09-24 ENCOUNTER — Other Ambulatory Visit: Payer: Self-pay | Admitting: Family Medicine

## 2022-09-24 DIAGNOSIS — E039 Hypothyroidism, unspecified: Secondary | ICD-10-CM

## 2022-10-07 ENCOUNTER — Ambulatory Visit (INDEPENDENT_AMBULATORY_CARE_PROVIDER_SITE_OTHER): Payer: Medicare Other

## 2022-10-07 ENCOUNTER — Encounter: Payer: Self-pay | Admitting: Gastroenterology

## 2022-10-07 VITALS — Ht 69.0 in | Wt 177.0 lb

## 2022-10-07 DIAGNOSIS — Z0000 Encounter for general adult medical examination without abnormal findings: Secondary | ICD-10-CM

## 2022-10-07 NOTE — Progress Notes (Signed)
I connected with  DAMONI WHEELUS on 10/07/22 by a audio enabled telemedicine application and verified that I am speaking with the correct person using two identifiers.  Patient Location: Home  Provider Location: Home Office  I discussed the limitations of evaluation and management by telemedicine. The patient expressed understanding and agreed to proceed.  Subjective:   KAELEB PURNELL is a 72 y.o. male who presents for Medicare Annual/Subsequent preventive examination.  Review of Systems      Cardiac Risk Factors include: advanced age (>46men, >87 women);hypertension;male gender;sedentary lifestyle     Objective:    Today's Vitals   10/07/22 1147  Weight: 177 lb (80.3 kg)  Height: 5\' 9"  (1.753 m)  PainSc: 5    Body mass index is 26.14 kg/m.     10/07/2022   11:59 AM 10/05/2021    2:54 PM 03/04/2021    9:22 AM 02/04/2021   11:35 AM 01/27/2021    8:10 AM 10/03/2020    2:41 PM 09/28/2020    3:34 PM  Advanced Directives  Does Patient Have a Medical Advance Directive? Yes Yes Yes Yes Yes No Yes  Type of Paramedic of [place];Living will [place];Living will [place];Living will [place];Living will [place];Living will  Living will  Does patient want to make changes to medical advance directive?   No - Patient declined No - Patient declined     Copy of [place] in Chart? No - copy requested No - copy requested Yes - validated most recent copy scanned in chart (See row information) Yes - validated most recent copy scanned in chart (See row information) No - copy requested    Would patient like information on creating a medical advance directive?      Yes (MAU/Ambulatory/Procedural Areas - Information given)     Current Medications (verified) Outpatient Encounter Medications as of 10/07/2022  Medication Sig   amphetamine-dextroamphetamine (ADDERALL) 10 MG tablet  TAKE ONE TABLET BY MOUTH TWICE A DAY   Ascorbic Acid (VITAMIN C) 500 MG tablet Take 500 mg by mouth daily.   aspirin 81 MG tablet Take 81 mg by mouth daily.   botulinum toxin Type A (BOTOX) 200 units injection Inject 200 units into the muscles every 3 months   Cholecalciferol (VITAMIN D3) 10 MCG (400 UNIT) tablet Take 400 Units by mouth daily.   diltiazem (CARDIZEM CD) 120 MG 24 hr capsule TAKE 1 CAPSULE BY MOUTH DAILY   donepezil (ARICEPT) 10 MG tablet TAKE 1 TABLET BY MOUTH  DAILY   fexofenadine (ALLEGRA) 180 MG tablet Take 180 mg by mouth daily.   Ginkgo Biloba 40 MG TABS Take by mouth as directed.   lamoTRIgine (LAMICTAL) 100 MG tablet TAKE 1 TABLET BY MOUTH  TWICE DAILY   levothyroxine (SYNTHROID) 137 MCG tablet TAKE 1 TABLET BY MOUTH DAILY  BEFORE BREAKFAST   Melatonin 5 MG CAPS Take by mouth as directed.   memantine (NAMENDA) 10 MG tablet TAKE 1 TABLET BY MOUTH  TWICE DAILY   metoprolol succinate (TOPROL-XL) 25 MG 24 hr tablet TAKE 1 TABLET BY MOUTH ONCE DAILY   montelukast (SINGULAIR) 10 MG tablet TAKE 1 TABLET BY MOUTH DAILY   Multiple Vitamin (MULTIVITAMIN) tablet Take 700 tablets by mouth daily.   Na Sulfate-K Sulfate-Mg Sulf 17.5-3.13-1.6 GM/177ML SOLN Use as directed; may use generic; goodrx card if insurance will not cover generic   NON FORMULARY Focus factor 1 tab  po bid   rosuvastatin (CRESTOR) 20 MG tablet TAKE 1 TABLET BY MOUTH AT  BEDTIME   tiZANidine (ZANAFLEX) 4 MG tablet Take 2-4 mg by mouth 2 (two) times daily as needed.   traMADol (ULTRAM) 50 MG tablet Take 50 mg by mouth 2 (two) times daily as needed.   vitamin B-12 (CYANOCOBALAMIN) 1000 MCG tablet Take 1,000 mcg by mouth daily.   vitamin E 180 MG (400 UNITS) capsule Take 400 Units by mouth daily.   Facility-Administered Encounter Medications as of 10/07/2022  Medication   botulinum toxin Type A (BOTOX) injection 155 Units    Allergies (verified) Penicillins   History: Past Medical History:  Diagnosis Date    ADD (attention deficit disorder)    Allergy    seasonal flares of perinneal allergies   Cancer    melanoma   Cardiac arrest 07/1997   aborted   Dementia    post resusitative   Depression    Diverticulosis    Family hx of prostate cancer    GERD (gastroesophageal reflux disease)    Headache(784.0)    Hyperlipidemia    Hypothyroidism    ICD (implantable cardiac defibrillator) in place    Idiopathic urticaria    Internal hemorrhoids    Low back pain syndrome    Melanoma in situ of skin of trunk    chest   PVC (premature ventricular contraction)    associated with cardiac arrest   Sleep apnea    no c-pap   Tubular adenoma of colon 10/2010   Past Surgical History:  Procedure Laterality Date    implantation x2     CARDIAC DEFIBRILLATOR PLACEMENT  07/01/2010   Explanation of a previously implanted device, pocket revision, and insertion of a new device, and intraoperative defibrillation threshold testing Caryl Comes)   CARDIAC DEFIBRILLATOR REMOVAL  07/05/2002   Defibrillator change Caryl Comes) 3 times changed   CATARACT EXTRACTION W/PHACO Left 02/04/2021   Procedure: CATARACT EXTRACTION PHACO AND INTRAOCULAR LENS PLACEMENT ([place]) LEFT VIVITY toric LENS 9.21 01:11.6;  Surgeon: Leandrew Koyanagi, MD;  Location: [facility];  Service: Ophthalmology;  Laterality: Left;  keep patient at 11:30 arrival   CATARACT EXTRACTION W/PHACO Right 03/04/2021   Procedure: CATARACT EXTRACTION PHACO AND INTRAOCULAR LENS PLACEMENT ([place]) RIGHT VIVITY LENS 8.59 01:15.3;  Surgeon: Leandrew Koyanagi, MD;  Location: [facility];  Service: Ophthalmology;  Laterality: Right;   COLONOSCOPY     HEMORRHOID SURGERY     ICD     TONSILLECTOMY     Family History  Problem Relation Age of Onset   Heart disease Mother    Hypertension Mother    Heart disease Father    Diverticulosis Father    Coronary artery disease Father    Alopecia Father    Prostate cancer Brother    Diabetes Brother    Heart  disease Brother        heart transplant   Lung cancer Paternal Grandfather        Smoker   Cancer Paternal Grandfather        lung cancer - smoker   Colon cancer Neg Hx    Esophageal cancer Neg Hx    Pancreatic cancer Neg Hx    Stomach cancer Neg Hx    Social History   Socioeconomic History   Marital status: Married    Spouse name: [name]   Number of children: 2   Years of education: College   Highest education level: Not on file  Occupational History  Employer: DISABLED  Tobacco Use   Smoking status: Former    Packs/day: 1.00    Years: 10.00    Additional pack years: 0.00    Total pack years: 10.00    Types: Cigarettes    Quit date: 07/06/1995    Years since quitting: 27.2   Smokeless tobacco: Former    Types: Chew    Quit date: 11/04/1994  Vaping Use   Vaping Use: Never used  Substance and Sexual Activity   Alcohol use: No    Alcohol/week: 0.0 standard drinks of alcohol    Comment: socially   Drug use: No   Sexual activity: Not on file  Other Topics Concern   Not on file  Social History Narrative   Patient is married [name]) [phone] with 2 daughters   5 grandchildren   Daily caffeine use - one cup of coffee and one-two cokes per day   Patient is right-handed.   Patient has a college education   Social Determinants of Radio broadcast assistant Strain: Low Risk  (10/07/2022)   Overall Financial Resource Strain (CARDIA)    Difficulty of Paying Living Expenses: Not hard at all  Food Insecurity: No Food Insecurity (10/07/2022)   Hunger Vital Sign    Worried About Running Out of Food in the Last Year: Never true    Ran Out of Food in the Last Year: Never true  Transportation Needs: No Transportation Needs (10/07/2022)   PRAPARE - Hydrologist (Medical): No    Lack of Transportation (Non-Medical): No  Physical Activity: Inactive (10/07/2022)   Exercise Vital Sign    Days of Exercise per Week: 0 days    Minutes of Exercise per Session: 0 min   Stress: No Stress Concern Present (10/07/2022)   [place] of Stress : Not at all  Social Connections: [place] (10/07/2022)   Social Connection and Isolation Panel [NHANES]    Frequency of Communication with Friends and Family: More than three times a week    Frequency of Social Gatherings with Friends and Family: More than three times a week    Attends Religious Services: More than 4 times per year    Active Member of [employer] or Organizations: Yes    Attends Music therapist: More than 4 times per year    Marital Status: Married    Tobacco Counseling Counseling given: Not Answered   Clinical Intake:  Pre-visit preparation completed: Yes  Pain : 0-10 Pain Score: 5  Pain Type: Chronic pain Pain Location: Neck (Followed by [facility] and pain management) Pain Descriptors / Indicators: Aching, Sharp Pain Frequency: Constant     Nutritional Risks: None Diabetes: No  How often do you need to have someone help you when you read instructions, pamphlets, or other written materials from your doctor or pharmacy?: 1 - Never  Diabetic? no  Interpreter Needed?: No  Information entered by :: C.Irish Breisch LPN   Activities of Daily Living    10/07/2022   12:00 PM  In your present state of health, do you have any difficulty performing the following activities:  Hearing? 0  Vision? 0  Difficulty concentrating or making decisions? 0  Comment yes  Walking or climbing stairs? 0  Dressing or bathing? 0  Doing errands, shopping? 0  Preparing Food and eating ? N  Using the Toilet? N  In the past six months, have you accidently  leaked urine? N  Do you have problems with loss of bowel control? N  Managing your Medications? N  Managing your Finances? N  Housekeeping or managing your Housekeeping? N    Patient Care Team: [name], MD as PCP - General (Family Medicine) Deboraha Sprang, MD as PCP - Electrophysiology (Cardiology) Sherren Mocha, MD as PCP - Cardiology (Cardiology) Leandrew Koyanagi, MD as Referring Physician (Ophthalmology)  Indicate any recent Medical Services you may have received from other than [facility] providers in the past year (date may be approximate).     Assessment:   This is a routine wellness examination for [place].  Hearing/Vision screen Hearing Screening - Comments:: No aids Vision Screening - Comments:: Readers - [facility]  Dietary issues and exercise activities discussed: Current Exercise Habits: The patient does not participate in regular exercise at present, Exercise limited by: None identified   Goals Addressed             This Visit's Progress    Patient Stated       Exercise more.       Depression Screen    10/07/2022   11:59 AM 10/05/2021    2:50 PM 10/03/2020    2:49 PM 08/29/2019    1:58 PM 08/14/2018   10:43 AM 08/03/2017   10:41 AM 05/27/2015    1:05 PM  PHQ 2/9 Scores  PHQ - 2 Score 0 0 0 0 0 0 0  PHQ- 9 Score   0 0  0     Fall Risk    10/07/2022   11:53 AM 10/05/2021    2:54 PM 10/03/2020    2:48 PM 08/29/2019    1:58 PM 08/14/2018   10:43 AM  Fall Risk   Falls in the past year? 0 0 0 0 0  Number falls in past yr: 0 0 0 0   Injury with Fall? 0 0 0 0   Risk for fall due to : No Fall Risks Impaired balance/gait Medication side effect Medication side effect   Follow up Falls prevention discussed;Falls evaluation completed Falls prevention discussed Falls evaluation completed;Falls prevention discussed Falls evaluation completed;Falls prevention discussed     FALL RISK PREVENTION PERTAINING TO THE HOME:  Any stairs in or around the home? No  If so, are there any without handrails? No  Home free of loose throw rugs in walkways, pet beds, electrical cords, etc? Yes  Adequate lighting in your home to reduce risk of falls? Yes   ASSISTIVE DEVICES UTILIZED TO PREVENT FALLS:  Life alert? No  Use of a  cane, walker or w/c? No  Grab bars in the bathroom? No  Shower chair or bench in shower? No  Elevated toilet seat or a handicapped toilet? Yes    Cognitive Function:    10/03/2020    2:54 PM 12/11/2019    9:58 AM 08/29/2019    2:00 PM 06/12/2019    9:33 AM 03/02/2018    8:55 AM  MMSE - Mini Mental State Exam  Not completed: Refused      Orientation to time  5 5 5 4   Orientation to Place  5 5 5 5   Registration  3 3 3 3   Attention/ Calculation  5 5 1 5   Recall  1 3 3 1   Language- name 2 objects  2  2 2   Language- repeat  1 1 1 1   Language- follow 3 step command  3  3 3   Language- read &  follow direction  1  1 1   Write a sentence  1  1 1   Copy design  1  1 1   Copy design-comments  12 animals  named 15 animals   Total score  28  26 27       06/15/2022    1:06 PM 12/21/2021   12:56 PM 07/02/2021   10:30 AM  Montreal Cognitive Assessment   Visuospatial/ Executive (0/5) 4 4 3   Naming (0/3) 3 3 3   Attention: Read list of digits (0/2) 2 2 2   Attention: Read list of letters (0/1) 1 1 1   Attention: Serial 7 subtraction starting at 100 (0/3) 3 3 2   Language: Repeat phrase (0/2) 1 1 1   Language : Fluency (0/1) 1 1 1   Abstraction (0/2) 2 2 2   Delayed Recall (0/5) 1 3 2   Orientation (0/6) 5 5 5   Total 23 25 22   Adjusted Score (based on education) 23        10/07/2022   12:00 PM  6CIT Screen  What Year? 0 points  What month? 0 points  What time? 0 points  Count back from 20 0 points  Months in reverse 0 points  Repeat phrase 6 points  Total Score 6 points    Immunizations Immunization History  Administered Date(s) Administered   Fluad Quad(high Dose 65+) 04/10/2019, 04/12/2020, 06/23/2021   Influenza Whole 05/01/2013   Influenza,inj,Quad PF,6+ Mos 05/27/2015, 06/01/2016, 04/19/2017, 04/20/2018   PFIZER(Purple Top)SARS-COV-2 Vaccination 07/31/2019, 08/21/2019   Pneumococcal Conjugate-13 08/03/2017   Pneumococcal Polysaccharide-23 08/14/2018   Td 01/02/1997, 11/20/2008    Zoster Recombinat (Shingrix) 09/19/2021    TDAP status: Due, Education has been provided regarding the importance of this vaccine. Advised may receive this vaccine at local pharmacy or Health Dept. Aware to provide a copy of the vaccination record if obtained from local pharmacy or Health Dept. Verbalized acceptance and understanding.  Flu Vaccine status: Up to date  Pneumococcal vaccine status: Up to date  Covid-19 vaccine status: Declined, Education has been provided regarding the importance of this vaccine but patient still declined. Advised may receive this vaccine at local pharmacy or Health Dept.or vaccine clinic. Aware to provide a copy of the vaccination record if obtained from local pharmacy or Health Dept. Verbalized acceptance and understanding.  Qualifies for Shingles Vaccine? Yes   Zostavax completed Yes   Shingrix Completed?: Yes  Screening Tests Health Maintenance  Topic Date Due   DTaP/Tdap/Td (3 - Tdap) 11/21/2018   COVID-19 Vaccine (3 - 2023-24 season) 03/05/2022   COLONOSCOPY (Pts 45-77yrs Insurance coverage will need to be confirmed)  10/11/2022 (Originally 11/23/2020)   INFLUENZA VACCINE  02/03/2023   Medicare Annual Wellness (AWV)  10/07/2023   Pneumonia Vaccine 46+ Years old  Completed   Hepatitis C Screening  Completed   Zoster Vaccines- Shingrix  Completed   HPV VACCINES  Aged Out    Health Maintenance  Health Maintenance Due  Topic Date Due   DTaP/Tdap/Td (3 - Tdap) 11/21/2018   COVID-19 Vaccine (3 - 2023-24 season) 03/05/2022    Colorectal cancer screening: Type of screening: Colonoscopy. Completed 11/24/15. Repeat every 5 years appointment scheduled 10/18/22  Lung Cancer Screening: (Low Dose CT Chest recommended if Age 34-80 years, 30 pack-year currently smoking OR have quit w/in 15years.) does not qualify.   Lung Cancer Screening Referral: no  Additional Screening:  Hepatitis C Screening: does qualify; Completed 05/26/16  Vision Screening:  Recommended annual ophthalmology exams for early detection of glaucoma and other disorders of  the eye. Is the patient up to date with their annual eye exam?  Yes  Who is the provider or what is the name of the office in which the patient attends annual eye exams? [facility] If pt is not established with a provider, would they like to be referred to a provider to establish care? No .   Dental Screening: Recommended annual dental exams for proper oral hygiene  Community Resource Referral / Chronic Care Management: CRR required this visit?  No   CCM required this visit?  No      Plan:     I have personally reviewed and noted the following in the patient's chart:   Medical and social history Use of alcohol, tobacco or illicit drugs  Current medications and supplements including opioid prescriptions. Patient is currently taking opioid prescriptions. Information provided to patient regarding non-opioid alternatives. Patient advised to discuss non-opioid treatment plan with their provider. Functional ability and status Nutritional status Physical activity Advanced directives List of other physicians Hospitalizations, surgeries, and ER visits in previous 12 months Vitals Screenings to include cognitive, depression, and falls Referrals and appointments  In addition, I have reviewed and discussed with patient certain preventive protocols, quality metrics, and best practice recommendations. A written personalized care plan for preventive services as well as general preventive health recommendations were provided to patient.     Lebron Conners, LPN   [id number]   Nurse Notes: none

## 2022-10-07 NOTE — Patient Instructions (Signed)
Larry [name] , Thank you for taking time to come for your Medicare Wellness Visit. I appreciate your ongoing commitment to your health goals. Please review the following plan we discussed and let me know if I can assist you in the future.   These are the goals we discussed:  Goals      Increase physical activity     Starting 08/03/2017, I will continue to walk at least 30 minutes daily and to use [employer] as tolerated.      Patient Stated     08/29/2019, I will start exercising more daily for about 30 minutes.     Patient Stated     10/04/2018, I will maintain and continue medications as prescribed.      Patient Stated     Exercise more.        This is a list of the screening recommended for you and due dates:  Health Maintenance  Topic Date Due   DTaP/Tdap/Td vaccine (3 - Tdap) 11/21/2018   COVID-19 Vaccine (3 - 2023-24 season) 03/05/2022   Colon Cancer Screening  10/11/2022*   Flu Shot  02/03/2023   Medicare Annual Wellness Visit  10/07/2023   Pneumonia Vaccine  Completed   Hepatitis C Screening: USPSTF Recommendation to screen - Ages 18-79 yo.  Completed   Zoster (Shingles) Vaccine  Completed   HPV Vaccine  Aged Out  *Topic was postponed. The date shown is not the original due date.    Advanced directives: Please bring a copy of your health care power of attorney and living will to the office to be added to your chart at your convenience.   Conditions/risks identified: Aim for 30 minutes of exercise or brisk walking, 6-8 glasses of water, and 5 servings of fruits and vegetables each day.   Next appointment: Follow up in one year for your annual wellness visit. 10/11/23 @ 11:30 telephone visit.  Preventive Care 70 Years and Older, Male  Preventive care refers to lifestyle choices and visits with your health care provider that can promote health and wellness. What does preventive care include? A yearly physical exam. This is also called an annual well check. Dental exams  once or twice a year. Routine eye exams. Ask your health care provider how often you should have your eyes checked. Personal lifestyle choices, including: Daily care of your teeth and gums. Regular physical activity. Eating a healthy diet. Avoiding tobacco and drug use. Limiting alcohol use. Practicing safe sex. Taking low doses of aspirin every day. Taking vitamin and mineral supplements as recommended by your health care provider. What happens during an annual well check? The services and screenings done by your health care provider during your annual well check will depend on your age, overall health, lifestyle risk factors, and family history of disease. Counseling  Your health care provider may ask you questions about your: Alcohol use. Tobacco use. Drug use. Emotional well-being. Home and relationship well-being. Sexual activity. Eating habits. History of falls. Memory and ability to understand (cognition). Work and work Statistician. Screening  You may have the following tests or measurements: Height, weight, and BMI. Blood pressure. Lipid and cholesterol levels. These may be checked every 5 years, or more frequently if you are over 34 years old. Skin check. Lung cancer screening. You may have this screening every year starting at age 29 if you have a 30-pack-year history of smoking and currently smoke or have quit within the past 15 years. Fecal occult blood test (FOBT) of  the stool. You may have this test every year starting at age 5. Flexible sigmoidoscopy or colonoscopy. You may have a sigmoidoscopy every 5 years or a colonoscopy every 10 years starting at age 45. Prostate cancer screening. Recommendations will vary depending on your family history and other risks. Hepatitis C blood test. Hepatitis B blood test. Sexually transmitted disease (STD) testing. Diabetes screening. This is done by checking your blood sugar (glucose) after you have not eaten for a while  (fasting). You may have this done every 1-3 years. Abdominal aortic aneurysm (AAA) screening. You may need this if you are a current or former smoker. Osteoporosis. You may be screened starting at age 79 if you are at high risk. Talk with your health care provider about your test results, treatment options, and if necessary, the need for more tests. Vaccines  Your health care provider may recommend certain vaccines, such as: Influenza vaccine. This is recommended every year. Tetanus, diphtheria, and acellular pertussis (Tdap, Td) vaccine. You may need a Td booster every 10 years. Zoster vaccine. You may need this after age 12. Pneumococcal 13-valent conjugate (PCV13) vaccine. One dose is recommended after age 81. Pneumococcal polysaccharide (PPSV23) vaccine. One dose is recommended after age 22. Talk to your health care provider about which screenings and vaccines you need and how often you need them. This information is not intended to replace advice given to you by your health care provider. Make sure you discuss any questions you have with your health care provider. Document Released: 07/18/2015 Document Revised: 03/10/2016 Document Reviewed: 04/22/2015 Elsevier Interactive Patient Education  2017 [place] Prevention in the Home Falls can cause injuries. They can happen to people of all ages. There are many things you can do to make your home safe and to help prevent falls. What can I do on the outside of my home? Regularly fix the edges of walkways and driveways and fix any cracks. Remove anything that might make you trip as you walk through a door, such as a raised step or threshold. Trim any bushes or trees on the path to your home. Use bright outdoor lighting. Clear any walking paths of anything that might make someone trip, such as rocks or tools. Regularly check to see if handrails are loose or broken. Make sure that both sides of any steps have handrails. Any raised  decks and porches should have guardrails on the edges. Have any leaves, snow, or ice cleared regularly. Use sand or salt on walking paths during winter. Clean up any spills in your garage right away. This includes oil or grease spills. What can I do in the bathroom? Use night lights. Install grab bars by the toilet and in the tub and shower. Do not use towel bars as grab bars. Use non-skid mats or decals in the tub or shower. If you need to sit down in the shower, use a plastic, non-slip stool. Keep the floor dry. Clean up any water that spills on the floor as soon as it happens. Remove soap buildup in the tub or shower regularly. Attach bath mats securely with double-sided non-slip rug tape. Do not have throw rugs and other things on the floor that can make you trip. What can I do in the bedroom? Use night lights. Make sure that you have a light by your bed that is easy to reach. Do not use any sheets or blankets that are too big for your bed. They should not hang down onto the  floor. Have a firm chair that has side arms. You can use this for support while you get dressed. Do not have throw rugs and other things on the floor that can make you trip. What can I do in the kitchen? Clean up any spills right away. Avoid walking on wet floors. Keep items that you use a lot in easy-to-reach places. If you need to reach something above you, use a strong step stool that has a grab bar. Keep electrical cords out of the way. Do not use floor polish or wax that makes floors slippery. If you must use wax, use non-skid floor wax. Do not have throw rugs and other things on the floor that can make you trip. What can I do with my stairs? Do not leave any items on the stairs. Make sure that there are handrails on both sides of the stairs and use them. Fix handrails that are broken or loose. Make sure that handrails are as long as the stairways. Check any carpeting to make sure that it is firmly attached  to the stairs. Fix any carpet that is loose or worn. Avoid having throw rugs at the top or bottom of the stairs. If you do have throw rugs, attach them to the floor with carpet tape. Make sure that you have a light switch at the top of the stairs and the bottom of the stairs. If you do not have them, ask someone to add them for you. What else can I do to help prevent falls? Wear shoes that: Do not have high heels. Have rubber bottoms. Are comfortable and fit you well. Are closed at the toe. Do not wear sandals. If you use a stepladder: Make sure that it is fully opened. Do not climb a closed stepladder. Make sure that both sides of the stepladder are locked into place. Ask someone to hold it for you, if possible. Clearly mark and make sure that you can see: Any grab bars or handrails. First and last steps. Where the edge of each step is. Use tools that help you move around (mobility aids) if they are needed. These include: Canes. Walkers. Scooters. Crutches. Turn on the lights when you go into a dark area. Replace any light bulbs as soon as they burn out. Set up your furniture so you have a clear path. Avoid moving your furniture around. If any of your floors are uneven, fix them. If there are any pets around you, be aware of where they are. Review your medicines with your doctor. Some medicines can make you feel dizzy. This can increase your chance of falling. Ask your doctor what other things that you can do to help prevent falls. This information is not intended to replace advice given to you by your health care provider. Make sure you discuss any questions you have with your health care provider. Document Released: 04/17/2009 Document Revised: 11/27/2015 Document Reviewed: 07/26/2014 Elsevier Interactive Patient Education  2017 [employer].

## 2022-10-08 ENCOUNTER — Ambulatory Visit (INDEPENDENT_AMBULATORY_CARE_PROVIDER_SITE_OTHER): Payer: Medicare Other

## 2022-10-08 DIAGNOSIS — Z8674 Personal history of sudden cardiac arrest: Secondary | ICD-10-CM

## 2022-10-09 LAB — CUP PACEART REMOTE DEVICE CHECK
Battery Remaining Longevity: 18 mo
Battery Remaining Percentage: 21 %
Brady Statistic RV Percent Paced: 1 %
Date Time Interrogation Session: 20240405032100
HighPow Impedance: 63 Ohm
Implantable Lead Connection Status: 753985
Implantable Lead Implant Date: 19990204
Implantable Lead Location: 753860
Implantable Lead Model: 135
Implantable Lead Serial Number: 301213
Implantable Pulse Generator Implant Date: 20111228
Lead Channel Impedance Value: 832 Ohm
Lead Channel Pacing Threshold Amplitude: 1.4 V
Lead Channel Pacing Threshold Pulse Width: 1 ms
Lead Channel Setting Pacing Amplitude: 2.4 V
Lead Channel Setting Pacing Pulse Width: 1 ms
Lead Channel Setting Sensing Sensitivity: 0.4 mV
Pulse Gen Serial Number: 277857

## 2022-10-10 ENCOUNTER — Other Ambulatory Visit: Payer: Self-pay | Admitting: Neurology

## 2022-10-10 DIAGNOSIS — R413 Other amnesia: Secondary | ICD-10-CM

## 2022-10-12 ENCOUNTER — Encounter: Payer: Self-pay | Admitting: Gastroenterology

## 2022-10-14 ENCOUNTER — Telehealth: Payer: Self-pay | Admitting: Neurology

## 2022-10-14 NOTE — Telephone Encounter (Signed)
Pt needs new Botox auth before 11/08/22 appt, current auth expired 09/28/22.

## 2022-10-15 NOTE — Telephone Encounter (Signed)
Benefit Verification [id number] Submitted!  Botox One

## 2022-10-15 NOTE — Telephone Encounter (Signed)
Buy and Bill:   

## 2022-10-18 ENCOUNTER — Ambulatory Visit (AMBULATORY_SURGERY_CENTER): Payer: Medicare Other | Admitting: Gastroenterology

## 2022-10-18 ENCOUNTER — Encounter: Payer: Self-pay | Admitting: Gastroenterology

## 2022-10-18 VITALS — BP 120/81 | HR 67 | Temp 98.0°F | Resp 18 | Ht 69.0 in | Wt 178.0 lb

## 2022-10-18 DIAGNOSIS — F32A Depression, unspecified: Secondary | ICD-10-CM | POA: Diagnosis not present

## 2022-10-18 DIAGNOSIS — R194 Change in bowel habit: Secondary | ICD-10-CM

## 2022-10-18 DIAGNOSIS — Z09 Encounter for follow-up examination after completed treatment for conditions other than malignant neoplasm: Secondary | ICD-10-CM | POA: Diagnosis not present

## 2022-10-18 DIAGNOSIS — R195 Other fecal abnormalities: Secondary | ICD-10-CM

## 2022-10-18 DIAGNOSIS — I10 Essential (primary) hypertension: Secondary | ICD-10-CM | POA: Diagnosis not present

## 2022-10-18 DIAGNOSIS — Z86010 Personal history of colonic polyps: Secondary | ICD-10-CM

## 2022-10-18 DIAGNOSIS — K6389 Other specified diseases of intestine: Secondary | ICD-10-CM | POA: Diagnosis not present

## 2022-10-18 MED ORDER — SODIUM CHLORIDE 0.9 % IV SOLN: 500.0000 mL | Freq: Once | INTRAVENOUS | Status: DC

## 2022-10-18 NOTE — Progress Notes (Signed)
History & Physical  Primary Care Physician:  Joaquim Nam, MD Primary Gastroenterologist: Claudette Head, MD  Impression / Plan:  Change in bowel habits with softer, more frequent stools for 2 months associated with abdominal bloating and a remote personal history of adenomatous colon polyps for colonoscopy.  CHIEF COMPLAINT: Change in bowel habits, with softer, more frequent stools, personal history of colon polyps   HPI: Larry [name] is a 72 y.o. male with a change in bowel habits with softer, more frequent stools for 2 months associated with abdominal bloating and a remote personal history of adenomatous colon polyps for colonoscopy.   Past Medical History:  Diagnosis Date   ADD (attention deficit disorder)    Allergy    seasonal flares of perinneal allergies   Cancer    melanoma   Cardiac arrest 07/1997   aborted   Dementia    post resusitative   Depression    Diverticulosis    Family hx of prostate cancer    GERD (gastroesophageal reflux disease)    Headache(784.0)    Hyperlipidemia    Hypothyroidism    ICD (implantable cardiac defibrillator) in place    Idiopathic urticaria    Internal hemorrhoids    Low back pain syndrome    Melanoma in situ of skin of trunk    chest   PVC (premature ventricular contraction)    associated with cardiac arrest   Sleep apnea    no c-pap   Tubular adenoma of colon 10/2010    Past Surgical History:  Procedure Laterality Date    implantation x2     CARDIAC DEFIBRILLATOR PLACEMENT  07/01/2010   Explanation of a previously implanted device, pocket revision, and insertion of a new device, and intraoperative defibrillation threshold testing [name])   CARDIAC DEFIBRILLATOR REMOVAL  07/05/2002   Defibrillator change [name]) 3 times changed   CATARACT EXTRACTION W/PHACO Left 02/04/2021   Procedure: CATARACT EXTRACTION PHACO AND INTRAOCULAR LENS PLACEMENT (IOC) LEFT VIVITY toric LENS 9.21 01:11.6;  Surgeon: Lockie Mola, MD;  Location: [facility];  Service: Ophthalmology;  Laterality: Left;  keep patient at 11:30 arrival   CATARACT EXTRACTION W/PHACO Right 03/04/2021   Procedure: CATARACT EXTRACTION PHACO AND INTRAOCULAR LENS PLACEMENT (IOC) RIGHT VIVITY LENS 8.59 01:15.3;  Surgeon: Lockie Mola, MD;  Location: [facility];  Service: Ophthalmology;  Laterality: Right;   COLONOSCOPY     HEMORRHOID SURGERY     ICD     TONSILLECTOMY      Prior to Admission medications   Medication Sig Start Date End Date Taking? Authorizing Provider  amphetamine-dextroamphetamine (ADDERALL) 10 MG tablet TAKE ONE TABLET BY MOUTH TWICE A DAY 07/07/22  Yes Joaquim Nam, MD  Ascorbic Acid (VITAMIN C) 500 MG tablet Take 500 mg by mouth daily.   Yes [provider]  aspirin 81 MG tablet Take 81 mg by mouth daily.   Yes [provider]  botulinum toxin Type A (BOTOX) 200 units injection Inject 200 units into the muscles every 3 months 06/30/22  Yes Levert Feinstein, MD  Cholecalciferol (VITAMIN D3) 10 MCG (400 UNIT) tablet Take 400 Units by mouth daily.   Yes [provider]  diltiazem (CARDIZEM CD) 120 MG 24 hr capsule TAKE 1 CAPSULE BY MOUTH DAILY 08/19/22  Yes Duke Salvia, MD  donepezil (ARICEPT) 10 MG tablet TAKE 1 TABLET BY MOUTH DAILY 10/11/22  Yes Levert Feinstein, MD  fexofenadine (ALLEGRA) 180 MG tablet Take 180 mg by mouth daily.  Yes [provider]  Ginkgo Biloba 40 MG TABS Take by mouth as directed.   Yes [provider]  lamoTRIgine (LAMICTAL) 100 MG tablet TAKE 1 TABLET BY MOUTH TWICE  DAILY 10/11/22  Yes Levert Feinstein, MD  levothyroxine (SYNTHROID) 137 MCG tablet TAKE 1 TABLET BY MOUTH DAILY  BEFORE BREAKFAST 09/27/22  Yes Joaquim Nam, MD  memantine (NAMENDA) 10 MG tablet TAKE 1 TABLET BY MOUTH TWICE  DAILY 10/11/22  Yes Levert Feinstein, MD  metoprolol succinate (TOPROL-XL) 25 MG 24 hr tablet TAKE 1 TABLET BY MOUTH ONCE DAILY 02/08/22  Yes Joaquim Nam, MD   montelukast (SINGULAIR) 10 MG tablet TAKE 1 TABLET BY MOUTH DAILY 09/03/22  Yes Joaquim Nam, MD  Multiple Vitamin (MULTIVITAMIN) tablet Take 700 tablets by mouth daily.   Yes [provider]  rosuvastatin (CRESTOR) 20 MG tablet TAKE 1 TABLET BY MOUTH AT  BEDTIME 09/03/22  Yes Joaquim Nam, MD  tiZANidine (ZANAFLEX) 4 MG tablet Take 2-4 mg by mouth 2 (two) times daily as needed. 05/03/22  Yes [provider]  vitamin B-12 (CYANOCOBALAMIN) 1000 MCG tablet Take 1,000 mcg by mouth daily.   Yes [provider]  vitamin E 180 MG (400 UNITS) capsule Take 400 Units by mouth daily.   Yes [provider]  Melatonin 5 MG CAPS Take by mouth as directed.    [provider]  NON FORMULARY Focus factor 1 tab po bid    [provider]  traMADol (ULTRAM) 50 MG tablet Take 50 mg by mouth 2 (two) times daily as needed. 06/06/22   [provider]    Current Outpatient Medications  Medication Sig Dispense Refill   amphetamine-dextroamphetamine (ADDERALL) 10 MG tablet TAKE ONE TABLET BY MOUTH TWICE A DAY 180 tablet 0   Ascorbic Acid (VITAMIN C) 500 MG tablet Take 500 mg by mouth daily.     aspirin 81 MG tablet Take 81 mg by mouth daily.     botulinum toxin Type A (BOTOX) 200 units injection Inject 200 units into the muscles every 3 months 1 each 3   Cholecalciferol (VITAMIN D3) 10 MCG (400 UNIT) tablet Take 400 Units by mouth daily.     diltiazem (CARDIZEM CD) 120 MG 24 hr capsule TAKE 1 CAPSULE BY MOUTH DAILY 90 capsule 3   donepezil (ARICEPT) 10 MG tablet TAKE 1 TABLET BY MOUTH DAILY 60 tablet 5   fexofenadine (ALLEGRA) 180 MG tablet Take 180 mg by mouth daily.     Ginkgo Biloba 40 MG TABS Take by mouth as directed.     lamoTRIgine (LAMICTAL) 100 MG tablet TAKE 1 TABLET BY MOUTH TWICE  DAILY 120 tablet 5   levothyroxine (SYNTHROID) 137 MCG tablet TAKE 1 TABLET BY MOUTH DAILY  BEFORE BREAKFAST 100 tablet 0   memantine (NAMENDA) 10 MG tablet  TAKE 1 TABLET BY MOUTH TWICE  DAILY 120 tablet 5   metoprolol succinate (TOPROL-XL) 25 MG 24 hr tablet TAKE 1 TABLET BY MOUTH ONCE DAILY 100 tablet 2   montelukast (SINGULAIR) 10 MG tablet TAKE 1 TABLET BY MOUTH DAILY 90 tablet 0   Multiple Vitamin (MULTIVITAMIN) tablet Take 700 tablets by mouth daily.     rosuvastatin (CRESTOR) 20 MG tablet TAKE 1 TABLET BY MOUTH AT  BEDTIME 90 tablet 0   tiZANidine (ZANAFLEX) 4 MG tablet Take 2-4 mg by mouth 2 (two) times daily as needed.     vitamin B-12 (CYANOCOBALAMIN) 1000 MCG tablet Take 1,000 mcg by  mouth daily.     vitamin E 180 MG (400 UNITS) capsule Take 400 Units by mouth daily.     Melatonin 5 MG CAPS Take by mouth as directed.     NON FORMULARY Focus factor 1 tab po bid     traMADol (ULTRAM) 50 MG tablet Take 50 mg by mouth 2 (two) times daily as needed.     Current Facility-Administered Medications  Medication Dose Route Frequency Provider Last Rate Last Admin   0.9 %  sodium chloride infusion  500 mL Intravenous Once Meryl Dare, MD       botulinum toxin Type A (BOTOX) injection 155 Units  155 Units Intramuscular Q90 days Levert Feinstein, MD        Allergies as of 10/18/2022 - Review Complete 10/18/2022  Allergen Reaction Noted   Penicillins  08/17/2006    Family History  Problem Relation Age of Onset   Heart disease Mother    Hypertension Mother    Heart disease Father    Diverticulosis Father    Coronary artery disease Father    Alopecia Father    Prostate cancer Brother    Diabetes Brother    Heart disease Brother        heart transplant   Lung cancer Paternal Grandfather        Smoker   Cancer Paternal Grandfather        lung cancer - smoker   Colon cancer Neg Hx    Esophageal cancer Neg Hx    Pancreatic cancer Neg Hx    Stomach cancer Neg Hx     Social History   Socioeconomic History   Marital status: Married    Spouse name: [name]   Number of children: 2   Years of education: College   Highest education level:  Not on file  Occupational History    Employer: DISABLED  Tobacco Use   Smoking status: Former    Packs/day: 1.00    Years: 10.00    Additional pack years: 0.00    Total pack years: 10.00    Types: Cigarettes    Quit date: 07/06/1995    Years since quitting: 27.3   Smokeless tobacco: Former    Types: Chew    Quit date: 11/04/1994  Vaping Use   Vaping Use: Never used  Substance and Sexual Activity   Alcohol use: No    Alcohol/week: 0.0 standard drinks of alcohol    Comment: socially   Drug use: No   Sexual activity: Not on file  Other Topics Concern   Not on file  Social History Narrative   Patient is married [name]) [phone] with 2 daughters   5 grandchildren   Daily caffeine use - one cup of coffee and one-two cokes per day   Patient is right-handed.   Patient has a college education   Social Determinants of Corporate investment banker Strain: Low Risk  (10/07/2022)   Overall Financial Resource Strain (CARDIA)    Difficulty of Paying Living Expenses: Not hard at all  Food Insecurity: No Food Insecurity (10/07/2022)   Hunger Vital Sign    Worried About Running Out of Food in the Last Year: Never true    Ran Out of Food in the Last Year: Never true  Transportation Needs: No Transportation Needs (10/07/2022)   PRAPARE - Administrator, Civil Service (Medical): No    Lack of Transportation (Non-Medical): No  Physical Activity: Inactive (10/07/2022)   Exercise Vital Sign  Days of Exercise per Week: 0 days    Minutes of Exercise per Session: 0 min  Stress: No Stress Concern Present (10/07/2022)   [employer] of Occupational Health - Occupational Stress Questionnaire    Feeling of Stress : Not at all  Social Connections: Socially Integrated (10/07/2022)   Social Connection and Isolation Panel [NHANES]    Frequency of Communication with Friends and Family: More than three times a week    Frequency of Social Gatherings with Friends and Family: More than three times a week     Attends Religious Services: More than 4 times per year    Active Member of [employer] or Organizations: Yes    Attends Engineer, structural: More than 4 times per year    Marital Status: Married  Catering manager Violence: Not At Risk (10/07/2022)   Humiliation, Afraid, Rape, and Kick questionnaire    Fear of Current or Ex-Partner: No    Emotionally Abused: No    Physically Abused: No    Sexually Abused: No    Review of Systems:  All systems reviewed were negative except where noted in HPI.   Physical Exam: General:  Alert, well-developed, in NAD Head:  Normocephalic and atraumatic. Eyes:  Sclera clear, no icterus.   Conjunctiva pink. Ears:  Normal auditory acuity. Mouth:  No deformity or lesions.  Neck:  Supple; no masses. Lungs:  Clear throughout to auscultation.   No wheezes, crackles, or rhonchi.  Heart:  Regular rate and rhythm; no murmurs. Abdomen:  Soft, nondistended, nontender. No masses, hepatomegaly. No palpable masses.  Normal bowel sounds.    Rectal:  Deferred   Msk:  Symmetrical without gross deformities. Extremities:  Without edema. Neurologic:  Alert and  oriented x 4; grossly normal neurologically. Skin:  Intact without significant lesions or rashes. Psych:  Alert and cooperative. Normal mood and affect.  Venita Lick. Russella Dar  10/18/2022, 10:13 AM See Loretha Stapler, [facility], to contact our on call provider

## 2022-10-18 NOTE — Progress Notes (Signed)
Pt resting comfortably. VSS. Airway intact. SBAR complete to RN. All questions answered.   

## 2022-10-18 NOTE — Patient Instructions (Signed)
Thank your for coming in to see [place] today. Resume a regular diet and your medications today. Return to regular daily activities tomorrow. Biopsy results will be available in 1-2 weeks and recommendations will be made at that time.   YOU HAD AN ENDOSCOPIC PROCEDURE TODAY AT THE [facility]:   Refer to the procedure report that was given to you for any specific questions about what was found during the examination.  If the procedure report does not answer your questions, please call your gastroenterologist to clarify.  If you requested that your care partner not be given the details of your procedure findings, then the procedure report has been included in a sealed envelope for you to review at your convenience later.  YOU SHOULD EXPECT: Some feelings of bloating in the abdomen. Passage of more gas than usual.  Walking can help get rid of the air that was put into your GI tract during the procedure and reduce the bloating. If you had a lower endoscopy (such as a colonoscopy or flexible sigmoidoscopy) you may notice spotting of blood in your stool or on the toilet paper. If you underwent a bowel prep for your procedure, you may not have a normal bowel movement for a few days.  Please Note:  You might notice some irritation and congestion in your nose or some drainage.  This is from the oxygen used during your procedure.  There is no need for concern and it should clear up in a day or so.  SYMPTOMS TO REPORT IMMEDIATELY:  Following lower endoscopy (colonoscopy or flexible sigmoidoscopy):  Excessive amounts of blood in the stool  Significant tenderness or worsening of abdominal pains  Swelling of the abdomen that is new, acute  Fever of 100F or higher   For urgent or emergent issues, a gastroenterologist can be reached at any hour by calling [phone]. Do not use MyChart messaging for urgent concerns.    DIET:  We do recommend a small meal at first, but then you may proceed to  your regular diet.  Drink plenty of fluids but you should avoid alcoholic beverages for 24 hours.  ACTIVITY:  You should plan to take it easy for the rest of today and you should NOT DRIVE or use heavy machinery until tomorrow (because of the sedation medicines used during the test).    FOLLOW UP: Our staff will call the number listed on your records the next business day following your procedure.  We will call around 7:15- 8:00 am to check on you and address any questions or concerns that you may have regarding the information given to you following your procedure. If we do not reach you, we will leave a message.     If any biopsies were taken you will be contacted by phone or by letter within the next 1-3 weeks.  Please call us at [phone] if you have not heard about the biopsies in 3 weeks.    SIGNATURES/CONFIDENTIALITY: You and/or your care partner have signed paperwork which will be entered into your electronic medical record.  These signatures attest to the fact that that the information above on your After Visit Summary has been reviewed and is understood.  Full responsibility of the confidentiality of this discharge information lies with you and/or your care-partner.

## 2022-10-18 NOTE — Op Note (Signed)
[facility] Patient Name: Larry [name] Procedure Date: 10/18/2022 10:12 AM MRN: [id number] Endoscopist: Meryl Dare , MD, [phone] Age: 72 Referring MD:  Date of Birth: 1950/11/22 Gender: Male Account #: [id number] Procedure:                Colonoscopy Indications:              Change in bowel habits, Change in stool                            consistency, Personal history of adenomatous colon                            polyps Medicines:                Monitored Anesthesia Care Procedure:                Pre-Anesthesia Assessment:                           - Prior to the procedure, a History and Physical                            was performed, and patient medications and                            allergies were reviewed. The patient's tolerance of                            previous anesthesia was also reviewed. The risks                            and benefits of the procedure and the sedation                            options and risks were discussed with the patient.                            All questions were answered, and informed consent                            was obtained. Prior Anticoagulants: The patient has                            taken no anticoagulant or antiplatelet agents. ASA                            Grade Assessment: III - A patient with severe                            systemic disease. After reviewing the risks and                            benefits, the patient was deemed in satisfactory  condition to undergo the procedure.                           After obtaining informed consent, the colonoscope                            was passed under direct vision. Throughout the                            procedure, the patient's blood pressure, pulse, and                            oxygen saturations were monitored continuously. The                            CF [id number] #[id number] was introduced through the anus                             and advanced to the the terminal ileum, with                            identification of the appendiceal orifice and IC                            valve. The ileocecal valve, appendiceal orifice,                            and rectum were photographed. The quality of the                            bowel preparation was adequate. The colonoscopy was                            performed without difficulty. The patient tolerated                            the procedure well. Scope In: 10:24:56 AM Scope Out: 10:39:00 AM Scope Withdrawal Time: 0 hours 11 minutes 42 seconds  Total Procedure Duration: 0 hours 14 minutes 4 seconds  Findings:                 The perianal and digital rectal examinations were                            normal.                           The terminal ileum appeared normal.                           Multiple medium-mouthed and small-mouthed                            diverticula were found in the left colon. There was  evidence of an impacted diverticulum. There was no                            evidence of diverticular bleeding.                           External and internal hemorrhoids were found during                            retroflexion. The hemorrhoids were Grade I                            (internal hemorrhoids that do not prolapse).                           The exam was otherwise without abnormality on                            direct and retroflexion views. Random biopsies                            obtained throughout. Complications:            No immediate complications. Estimated blood loss:                            None. Estimated Blood Loss:     Estimated blood loss: none. Impression:               - Normal terminal ileum.                           - Moderate diverticulosis in the left colon.                           - External and internal hemorrhoids.                           - The  examination was otherwise normal on direct                            and retroflexion views. Biopsied. Recommendation:           - Considre repeat colonoscopy vs no repeat due to                            age after studies are complete for surveillance                            based on pathology results.                           - Patient has a contact number available for                            emergencies. The signs and symptoms of potential  delayed complications were discussed with the                            patient. Return to normal activities tomorrow.                            Written discharge instructions were provided to the                            patient.                           - Resume previous diet.                           - Continue present medications.                           - Await pathology results. Meryl Dare, MD 10/18/2022 10:50:06 AM This report has been signed electronically.

## 2022-10-18 NOTE — Progress Notes (Signed)
Pt's states no medical or surgical changes since previsit or office visit. 

## 2022-10-18 NOTE — Progress Notes (Signed)
Called to room to assist during endoscopic procedure.  Patient ID and intended procedure confirmed with present staff. Received instructions for my participation in the procedure from the performing physician.  

## 2022-10-19 ENCOUNTER — Telehealth: Payer: Self-pay | Admitting: *Deleted

## 2022-10-19 NOTE — Telephone Encounter (Signed)
  Follow up Call-     10/18/2022    9:14 AM  Call back number  Post procedure Call Back phone  # [phone]  Permission to leave phone message No   No answer, message not left

## 2022-10-20 ENCOUNTER — Telehealth: Payer: Self-pay | Admitting: Family Medicine

## 2022-10-20 ENCOUNTER — Other Ambulatory Visit: Payer: Self-pay | Admitting: Family Medicine

## 2022-10-20 NOTE — Telephone Encounter (Signed)
Prescription Request  10/20/2022  LOV: 11/05/2021  What is the name of the medication or equipment? amphetamine-dextroamphetamine (ADDERALL) 10 MG tablet   Have you contacted your pharmacy to request a refill? Yes pt stated he was told [facility] sent over a refill request   Which pharmacy would you like this sent to?   [facility] [address] [facility] - [place], [place] - [address] AT [facility] [address] [place] [address] Phone: [phone] Fax: [phone]     Patient notified that their request is being sent to the clinical staff for review and that they should receive a response within 2 business days.   Please advise at Mobile [phone] (mobile)

## 2022-10-20 NOTE — Telephone Encounter (Signed)
Sent. Thanks.   

## 2022-10-20 NOTE — Telephone Encounter (Signed)
Refill request for   amphetamine-dextroamphetamine (ADDERALL) 10 MG tablet    LOV - 11/05/21 NOV - 11/09/22 RF - 07/07/22 #180/0

## 2022-10-20 NOTE — Telephone Encounter (Signed)
LOV - 11/05/21 NOV - 11/09/22 RF - 07/07/22 #180/0

## 2022-10-20 NOTE — Telephone Encounter (Signed)
Prev sent in another encounter.

## 2022-10-25 ENCOUNTER — Other Ambulatory Visit (HOSPITAL_COMMUNITY): Payer: Self-pay

## 2022-10-26 ENCOUNTER — Encounter: Payer: Self-pay | Admitting: Gastroenterology

## 2022-10-31 ENCOUNTER — Other Ambulatory Visit: Payer: Self-pay | Admitting: Family Medicine

## 2022-10-31 DIAGNOSIS — E7849 Other hyperlipidemia: Secondary | ICD-10-CM

## 2022-10-31 DIAGNOSIS — R739 Hyperglycemia, unspecified: Secondary | ICD-10-CM

## 2022-10-31 DIAGNOSIS — Z125 Encounter for screening for malignant neoplasm of prostate: Secondary | ICD-10-CM

## 2022-11-02 ENCOUNTER — Other Ambulatory Visit (INDEPENDENT_AMBULATORY_CARE_PROVIDER_SITE_OTHER): Payer: Medicare Other

## 2022-11-02 DIAGNOSIS — R739 Hyperglycemia, unspecified: Secondary | ICD-10-CM | POA: Diagnosis not present

## 2022-11-02 DIAGNOSIS — H43811 Vitreous degeneration, right eye: Secondary | ICD-10-CM | POA: Diagnosis not present

## 2022-11-02 DIAGNOSIS — E7849 Other hyperlipidemia: Secondary | ICD-10-CM

## 2022-11-02 DIAGNOSIS — Z125 Encounter for screening for malignant neoplasm of prostate: Secondary | ICD-10-CM

## 2022-11-02 DIAGNOSIS — Z961 Presence of intraocular lens: Secondary | ICD-10-CM | POA: Diagnosis not present

## 2022-11-02 DIAGNOSIS — H04123 Dry eye syndrome of bilateral lacrimal glands: Secondary | ICD-10-CM | POA: Diagnosis not present

## 2022-11-02 LAB — LIPID PANEL
Cholesterol: 183 mg/dL (ref 0–200)
HDL: 62.2 mg/dL (ref >39.00)
LDL Cholesterol: 94 mg/dL (ref 0–99)
NonHDL: 120.39
Total CHOL/HDL Ratio: 3
Triglycerides: 130 mg/dL (ref 0.0–149.0)
VLDL: 26 mg/dL (ref 0.0–40.0)

## 2022-11-02 LAB — PSA, MEDICARE: PSA: 0.38 ng/ml (ref 0.10–4.00)

## 2022-11-02 LAB — HEMOGLOBIN A1C: Hgb A1c MFr Bld: 6.1 % (ref 4.6–6.5)

## 2022-11-03 ENCOUNTER — Ambulatory Visit: Payer: Medicare Other | Admitting: Neurology

## 2022-11-03 ENCOUNTER — Other Ambulatory Visit: Payer: Self-pay | Admitting: Family Medicine

## 2022-11-03 DIAGNOSIS — E7849 Other hyperlipidemia: Secondary | ICD-10-CM

## 2022-11-03 DIAGNOSIS — I10 Essential (primary) hypertension: Secondary | ICD-10-CM

## 2022-11-08 ENCOUNTER — Ambulatory Visit: Payer: Medicare Other | Admitting: Neurology

## 2022-11-08 VITALS — BP 152/91

## 2022-11-08 DIAGNOSIS — G243 Spasmodic torticollis: Secondary | ICD-10-CM | POA: Diagnosis not present

## 2022-11-08 DIAGNOSIS — M542 Cervicalgia: Secondary | ICD-10-CM

## 2022-11-08 MED ORDER — ONABOTULINUMTOXINA 200 UNITS IJ SOLR
200.0000 [IU] | Freq: Once | INTRAMUSCULAR | Status: AC
Start: 2022-11-08 — End: 2022-11-08
  Administered 2022-11-08: 200 [IU] via INTRAMUSCULAR

## 2022-11-08 NOTE — Progress Notes (Signed)
ASSESSMENT AND PLAN 72 y.o. year old male    Anoxic brain injury in 1999 due to prolonged cardiac arrest,  With residual mild cognitive impairment  Slow worsening over the years  Continue Aricept 10 mg daily, Namenda 10 mg twice a day     Seizure  Most recent in June 2019, EEG was normal  Continue lamotrigine 100 mg twice a day  Gait abnormality, worsening neck pain, limited range of motion, abnormal neck posturing, mild anterocollis, right tilt, significant tenderness along cervical paraspinal muscles, worse on the right side   CT cervical spine in June 2023, multilevel degenerative changes, severe right-sided C1-C2 osteoarthritis, variable degree of foraminal narrowing, no significant canal stenosis  He failed epidural injection, complains of increased confusion dizziness, unsteadiness with tizanidine, tramadol treatment,    EMG guided Botox injection, used 200 units today (100 units dissolving to 2 cc of normal saline)  Right levator scapular 25 Left levator scapula 25 units  Right longissimus capitis 25 units Right semispinalis 25 units Right splenius capitis 25 units Right splenius cervix 25 units Right iliocostalis 25 units  He tolerated injection well, will return to clinic in 3 months for repeat injection  DIAGNOSTIC DATA (LABS, IMAGING, TESTING) - I reviewed patient records, labs, notes, testing and imaging myself where available.   HISTORY OF PRESENT ILLNESS:  Larry [name] is a very friendly 72 year old right-handed gentleman presents for followup consultation of his memory loss in the context of anoxic brain injury.    He is accompanied by his wife today. He was a patient of Dr. Sandria Manly, The patient has an underlying medical history of cardiac death from ventricular fibrillation with hypoxic brain injury in Jan 1999 due to arrythmia, 15 minutes without heart beat,  status post defibrillator placement, He stayed in hospital for 35 days, required prolonged  rehabilitation, has to relearn walking again, significant amnesia, "as if that he did not have a past".   EEG showed diffuse slowing in January 1999, CT head and brain MRI were normal. CT head repeat in November 2000 showed slight prominence of the lateral and third ventricles. CT head in July 2003 showed prominence of the ventricular system. Psychological testing and August 1999 showed borderline performance IQ. He became depressed. In January 2014 his MMSE was 29, clock drawing was 4, animal fluency was 10.  He went to [facility] for 2 years for patient with degenerative disorder.   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 He lives at home with his wife, still driving.   He helps the household, he still has difficulty focusing.    He has had some fluctuation in his memory per wife. He has ADD and continues to take Adderall, 10 mg twice a day, without Adderall, he could not remember  the pages that he has read,he is also taking Aricept 10 mg every day, which has been very helpful too.   Up date April 14 2016YY   His overall doing very well, he likes play computer games, which has helped his eye and hands coordination, he had recent trip to [place], felt overwhelming, he continued to drive short distance,   He is taking Ritalin 10 mg twice a day, Aricept 10 mg once a day, complains of insomnia, he took his last dose of Ritalin at evening time.   UPDATE Apr 21 2015:YY He is now taking Namenda 10mg  bid and aricept 10mg  qday, he still has  trouble sleeping,  He watch movie, stay up late, he continues to have focus difficulty, tends to become agitated.   UPDATE April 27th 2017: YY He still has chronic insomnia, tends to have irregular sleep pattern, stay up late watching TV, he still very active during the day, take Adderall 10 mg twice a day, still drives short distance, mild unsteady gait, tends to hold his left arm to his  chest   UPDATE Oct 17th 2017:YY He is with his wife,  Her daughter was hospital, presented with sudden loss of consciousness yesterday April 19 2016, which has brought back a lot of his memory, he has not tried clonazepam for chronic insomnia, worry about side effect, tried melatonin, still has significant insomnia, he is taking Tylenol pm,    UPDATE March 02 2018: He was follow-up by [facility] in 2018, on December 19, 2017, visiting his daughter at [place], in the morning time, he had sudden onset dizziness, feels like going to pass out, then had transient loss of consciousness, eyes rolled back, staring, lasting for 1 minutes, followed by postevent mild confusion and slurred speech last about 40 minutes, was treated at local hospital.   I personally reviewed CT head without contrast: There was no acute abnormality, generalized atrophy, ventriculomegaly,  laboratory evaluation, EKG showed no significant abnormality.   Similar spells in March 2019, transient dizziness followed by sudden loss of muscle tone, consciousness, extreme fatigue, slept for 1 hour afterwards,   Patient reported that he actually has frequent spells about once a month, he has transient confusion, feels like he is going to pass out, short lasting,   UPDATE Jun 05 2018: He tolerated lamotrigine very well, taking 100 mg twice a day without significant side effect,   EEG was normal.  Since lamotrigine, he no longer has recurrent spells of rising sensation transient confusion even though there was no clinical seizure activity observed during those spells, he had those spells about once a week prior to lamotrigine,  UPDATE Jun 19 2020: Is accompanied by his wife at today's clinical visit, slow worsening memory loss, when he goes to a different room, he often forgets why is he there, slight worsening gait abnormality, he also complains of worsening tinnitus, difficulty sleeping sometimes,  I personally reviewed CT temporal bone  w/wo in April 2021:  1. Negative CT appearance of the bilateral temporal bones. No explanation for hearing loss. 2. Note a mildly high riding right jugular bulb, with thin but intact overlying bony covering. 3. Hyperplastic but clear visible paranasal sinuses.  UPDATE March 29th 2022: He used to have headache even before his cardiac arrest, only occasionally, mild, his 2 daughter does has typical migraine headache,  Over the years, he has occasionally headache usually short lasting, on March 26, he complains of transient sharp pain shooting from the left parietal region downward, lasting for few seconds, then had a trickle of pressure headache, take ibuprofen, which did improve  Next morning March 27 Sunday, while at church, at the end of the sermon, he was noted to holding his left side, he also noticed transient blurry vision, was brought to the emergency room,  I personally reviewed CT head with contrast, no acute intracranial abnormality, no venous sinus thrombosis, arterial branch that was visualized showed no significant abnormality  Laboratory showed normal ESR, negative troponin, normal CBC, BMP with exception of elevated glucose 140, was seen by ophthalmology today, no significant abnormality found, was told he has cataract, left side was slightly worse than  the right  Update June 14th 2022: I reviewed his headache diary, few episode in March, transient sharp, only 1 episode on October 19, 2020, no longer has frequent headaches, reported worsening vision bilaterally, left worse than right, on today's near chart examination, visual acuity was 20/50 OS/OD,  Also complains of worsening gait abnormality, wife noticed worsening memory loss,  We personally reviewed CT of temporal bone with without contrast in April 2021 for evaluation of his reported hearing loss, no acute abnormality, CT venogram of brain on September 28, 2020 that was normal  Update July 02, 2021: Is accompanied by his  wife, continued slow worsening, increased memory loss, slow reaction time, continue to drive, wife has to be vigilant sitting at the passenger side, MoCA examination 22/30 today,  Over the past few months, he had few episode of transient ice pricking pain, not long-lasting, He also complains of back pain, rigidity, decreased range of motion  UPDATE December 21 2021: He continues to have slowing memory loss, MoCA examination 25/30, actually improved from previous memory of 22/30, he has slower reaction time, take time to figure out things,  He did have a history of obstructive sleep apnea, tried different facial mask, including nasal cannula in the past, could not tolerate it,  He also complains of worsening neck pain, stiffness, limited range of motion of his neck, somewhat improvement by physical therapy and dry needling, he denies bowel and bladder incontinence  Personally reviewed x-ray of cervical spine, mild to moderate multilevel cervical degenerative disc disease worse at C6-7  UPDATE Jun 15 2022: He is with his wife, complains of worsening short term memory loss, difficulty multitasking, feel frustrated easily,  He complains of worsening neck pain, difficulty turning his neck, 8 out of 10 constant achy pain, worsening shooting pain to right shoulder, occipital region moving his neck, especially looking to the left, underwent epidural injection without help his symptoms much.  UPDATE Aug 11 2022: Here for his first EMG guided Botox a injection for severe neck pain, especially at the right side CT cervical spine June 2023, multilevel degenerative changes, severe right-sided C1-C2 osteoarthritis, tight right cervical paraspinal muscles, tenderness upon deep palpitation,  UPDATE Nov 08 2022: Moderate improvement of his neck pain following his initial Botox injection in February 2024, used 200 units, no ill effect noticed  Wife complains of worsening cognitive impairment, climbing up a ladder,  almost fell  REVIEW OF SYSTEMS: Out of a complete 14 system review of symptoms, the patient complains only of the following symptoms, and all other reviewed systems are negative.  Memory loss, seizures  PHYSICAL EXAM  Vitals:   06/15/22 1304  BP: (!) 142/89  Pulse: 78  Weight: 179 lb 6.4 oz (81.4 kg)  Height: 5\' 9"  (1.753 m)   Body mass index is 26.49 kg/m.  Generalized: Well developed, in no acute distress     06/15/2022    1:06 PM 12/21/2021   12:56 PM 07/02/2021   10:30 AM  Montreal Cognitive Assessment   Visuospatial/ Executive (0/5) 4 4 3   Naming (0/3) 3 3 3   Attention: Read list of digits (0/2) 2 2 2   Attention: Read list of letters (0/1) 1 1 1   Attention: Serial 7 subtraction starting at 100 (0/3) 3 3 2   Language: Repeat phrase (0/2) 1 1 1   Language : Fluency (0/1) 1 1 1   Abstraction (0/2) 2 2 2   Delayed Recall (0/5) 1 3 2   Orientation (0/6) 5 5 5   Total 23 25  22  Adjusted Score (based on education) 23       NEUROLOGICAL EXAM:  Neck: Limited range of motion, of neck, looking to either left or right, tenderness of cervical paraspinal muscles upon deep palpation, especially right levator scapula, upper and lower cervical regions, neck tilt to right shoulder, anterocollis,  MENTAL STATUS: Speech/Cognition: Slow reaction time time, cooperative on examination  CRANIAL NERVES: CN II: Visual fields are full to confrontation.  Pupils are small, reactive CN III, IV, VI: extraocular movement are normal. No ptosis. CN V: Facial sensation is intact to light touch. CN VII: Face is symmetric with normal eye closure and smile. CN VIII: Hearing is normal to casual conversation CN IX, X: Palate elevates symmetrically. Phonation is normal. CN XI: Head turning and shoulder shrug are intact  MOTOR:  Fixation left upper extremity on rapid rotating movement  REFLEXES: Reflexes are 2  and symmetric at the biceps, triceps, knees and ankles. Plantar responses are  flexor.  SENSORY: Intact to light touch, pinprick, positional and vibratory sensation at fingers and toes.  COORDINATION: There is no trunk or limb ataxia.    GAIT/STANCE: He needs push-up to get up from seated position, tends to hold his left arm in elbow flexion, mildly wide-based, unsteady   ALLERGIES: Allergies  Allergen Reactions   Penicillins     Difficult Breathing, rash    HOME MEDICATIONS: Outpatient Medications Prior to Visit  Medication Sig Dispense Refill   amphetamine-dextroamphetamine (ADDERALL) 10 MG tablet TAKE ONE TABLET BY MOUTH TWICE A DAY 180 tablet 0   Ascorbic Acid (VITAMIN C) 500 MG tablet Take 500 mg by mouth daily.     aspirin 81 MG tablet Take 81 mg by mouth daily.     botulinum toxin Type A (BOTOX) 200 units injection Inject 200 units into the muscles every 3 months 1 each 3   Cholecalciferol (VITAMIN D3) 10 MCG (400 UNIT) tablet Take 400 Units by mouth daily.     diltiazem (CARDIZEM CD) 120 MG 24 hr capsule TAKE 1 CAPSULE BY MOUTH DAILY 90 capsule 3   donepezil (ARICEPT) 10 MG tablet TAKE 1 TABLET BY MOUTH DAILY 60 tablet 5   fexofenadine (ALLEGRA) 180 MG tablet Take 180 mg by mouth daily.     Ginkgo Biloba 40 MG TABS Take by mouth as directed.     lamoTRIgine (LAMICTAL) 100 MG tablet TAKE 1 TABLET BY MOUTH TWICE  DAILY 120 tablet 5   levothyroxine (SYNTHROID) 137 MCG tablet TAKE 1 TABLET BY MOUTH DAILY  BEFORE BREAKFAST 100 tablet 0   Melatonin 5 MG CAPS Take by mouth as directed.     memantine (NAMENDA) 10 MG tablet TAKE 1 TABLET BY MOUTH TWICE  DAILY 120 tablet 5   metoprolol succinate (TOPROL-XL) 25 MG 24 hr tablet TAKE 1 TABLET BY MOUTH ONCE DAILY 100 tablet 2   montelukast (SINGULAIR) 10 MG tablet TAKE 1 TABLET BY MOUTH DAILY 90 tablet 0   Multiple Vitamin (MULTIVITAMIN) tablet Take 700 tablets by mouth daily.     NON FORMULARY Focus factor 1 tab po bid     rosuvastatin (CRESTOR) 20 MG tablet TAKE 1 TABLET BY MOUTH AT  BEDTIME 90 tablet 0    tiZANidine (ZANAFLEX) 4 MG tablet Take 2-4 mg by mouth 2 (two) times daily as needed.     traMADol (ULTRAM) 50 MG tablet Take 50 mg by mouth 2 (two) times daily as needed.     vitamin B-12 (CYANOCOBALAMIN) 1000 MCG tablet Take 1,000  mcg by mouth daily.     vitamin E 180 MG (400 UNITS) capsule Take 400 Units by mouth daily.     Facility-Administered Medications Prior to Visit  Medication Dose Route Frequency Provider Last Rate Last Admin   botulinum toxin Type A (BOTOX) injection 155 Units  155 Units Intramuscular Q90 days Levert Feinstein, MD        PAST MEDICAL HISTORY: Past Medical History:  Diagnosis Date   ADD (attention deficit disorder)    Allergy    seasonal flares of perinneal allergies   Cancer (HCC)    melanoma   Cardiac arrest (HCC) 07/1997   aborted   Dementia (HCC)    post resusitative   Depression    Diverticulosis    Family hx of prostate cancer    GERD (gastroesophageal reflux disease)    Headache(784.0)    Hyperlipidemia    Hypothyroidism    ICD (implantable cardiac defibrillator) in place    Idiopathic urticaria    Internal hemorrhoids    Low back pain syndrome    Melanoma in situ of skin of trunk (HCC)    chest   PVC (premature ventricular contraction)    associated with cardiac arrest   Sleep apnea    no c-pap   Tubular adenoma of colon 10/2010    PAST SURGICAL HISTORY: Past Surgical History:  Procedure Laterality Date    implantation x2     CARDIAC DEFIBRILLATOR PLACEMENT  07/01/2010   Explanation of a previously implanted device, pocket revision, and insertion of a new device, and intraoperative defibrillation threshold testing [name])   CARDIAC DEFIBRILLATOR REMOVAL  07/05/2002   Defibrillator change [name]) 3 times changed   CATARACT EXTRACTION W/PHACO Left 02/04/2021   Procedure: CATARACT EXTRACTION PHACO AND INTRAOCULAR LENS PLACEMENT (IOC) LEFT VIVITY toric LENS 9.21 01:11.6;  Surgeon: Lockie Mola, MD;  Location: [facility];   Service: Ophthalmology;  Laterality: Left;  keep patient at 11:30 arrival   CATARACT EXTRACTION W/PHACO Right 03/04/2021   Procedure: CATARACT EXTRACTION PHACO AND INTRAOCULAR LENS PLACEMENT (IOC) RIGHT VIVITY LENS 8.59 01:15.3;  Surgeon: Lockie Mola, MD;  [facility];  Service: Ophthalmology;  Laterality: Right;   COLONOSCOPY     HEMORRHOID SURGERY     ICD     TONSILLECTOMY      FAMILY HISTORY: Family History  Problem Relation Age of Onset   Heart disease Mother    Hypertension Mother    Heart disease Father    Diverticulosis Father    Coronary artery disease Father    Alopecia Father    Prostate cancer Brother    Diabetes Brother    Heart disease Brother        heart transplant   Lung cancer Paternal Grandfather        Smoker   Cancer Paternal Grandfather        lung cancer - smoker   Colon cancer Neg Hx    Esophageal cancer Neg Hx    Pancreatic cancer Neg Hx    Stomach cancer Neg Hx     SOCIAL HISTORY: Social History   Socioeconomic History   Marital status: Married    Spouse name: Larry [name]   Number of children: 2   Years of education: College   Highest education level: Not on file  Occupational History    Employer: DISABLED  Tobacco Use   Smoking status: Former    Packs/day: 1.00    Years: 10.00    Additional pack years: 0.00  Total pack years: 10.00    Types: Cigarettes    Quit date: 07/06/1995    Years since quitting: 27.3   Smokeless tobacco: Former    Types: Chew    Quit date: 11/04/1994  Vaping Use   Vaping Use: Never used  Substance and Sexual Activity   Alcohol use: No    Alcohol/week: 0.0 standard drinks of alcohol    Comment: socially   Drug use: No   Sexual activity: Not on file  Other Topics Concern   Not on file  Social History Narrative   Patient is married Larry [name]) [phone] with 2 daughters   5 grandchildren   Daily caffeine use - one cup of coffee and one-two cokes per day   Patient is right-handed.   Patient  has a college education   Social Determinants of Corporate investment banker Strain: Low Risk  (10/07/2022)   Overall Financial Resource Strain (CARDIA)    Difficulty of Paying Living Expenses: Not hard at all  Food Insecurity: No Food Insecurity (10/07/2022)   Hunger Vital Sign    Worried About Running Out of Food in the Last Year: Never true    Ran Out of Food in the Last Year: Never true  Transportation Needs: No Transportation Needs (10/07/2022)   PRAPARE - Administrator, Civil Service (Medical): No    Lack of Transportation (Non-Medical): No  Physical Activity: Inactive (10/07/2022)   Exercise Vital Sign    Days of Exercise per Week: 0 days    Minutes of Exercise per Session: 0 min  Stress: No Stress Concern Present (10/07/2022)   [employer] of Occupational Health - Occupational Stress Questionnaire    Feeling of Stress : Not at all  Social Connections: Socially Integrated (10/07/2022)   Social Connection and Isolation Panel [NHANES]    Frequency of Communication with Friends and Family: More than three times a week    Frequency of Social Gatherings with Friends and Family: More than three times a week    Attends Religious Services: More than 4 times per year    Active Member of [employer] or Organizations: Yes    Attends Banker Meetings: More than 4 times per year    Marital Status: Married  Catering manager Violence: Not At Risk (10/07/2022)   Humiliation, Afraid, Rape, and Kick questionnaire    Fear of Current or Ex-Partner: No    Emotionally Abused: No    Physically Abused: No    Sexually Abused: No     Levert Feinstein, M.D. Ph.D.  [facility] [address] [place], [place] [address] Phone: [phone] Fax:      [phone]

## 2022-11-08 NOTE — Progress Notes (Signed)
Botox- 200 units x 1 vial Lot: [id number] Expiration: 12/2024 NDC: 1478-2956-21  Bacteriostatic 0.9% Sodium Chloride- 4 mL  Lot: [id number] Expiration: 05/2024 NDC: 46962-952-84  Dx: G24.3  B/B Witnessed by Karma Lew

## 2022-11-09 ENCOUNTER — Encounter: Payer: Self-pay | Admitting: Family Medicine

## 2022-11-09 ENCOUNTER — Ambulatory Visit (INDEPENDENT_AMBULATORY_CARE_PROVIDER_SITE_OTHER): Payer: Medicare Other | Admitting: Family Medicine

## 2022-11-09 ENCOUNTER — Encounter: Payer: Self-pay | Admitting: *Deleted

## 2022-11-09 VITALS — BP 120/70 | HR 76 | Temp 97.2°F | Ht 69.0 in | Wt 173.0 lb

## 2022-11-09 DIAGNOSIS — I10 Essential (primary) hypertension: Secondary | ICD-10-CM | POA: Diagnosis not present

## 2022-11-09 DIAGNOSIS — Z0000 Encounter for general adult medical examination without abnormal findings: Secondary | ICD-10-CM

## 2022-11-09 DIAGNOSIS — E039 Hypothyroidism, unspecified: Secondary | ICD-10-CM | POA: Diagnosis not present

## 2022-11-09 DIAGNOSIS — E7849 Other hyperlipidemia: Secondary | ICD-10-CM | POA: Diagnosis not present

## 2022-11-09 DIAGNOSIS — Z7189 Other specified counseling: Secondary | ICD-10-CM

## 2022-11-09 DIAGNOSIS — Z136 Encounter for screening for cardiovascular disorders: Secondary | ICD-10-CM

## 2022-11-09 DIAGNOSIS — R413 Other amnesia: Secondary | ICD-10-CM | POA: Diagnosis not present

## 2022-11-09 MED ORDER — MONTELUKAST SODIUM 10 MG PO TABS: 10.0000 mg | ORAL_TABLET | Freq: Every day | ORAL | 3 refills | Status: DC

## 2022-11-09 MED ORDER — LEVOTHYROXINE SODIUM 137 MCG PO TABS: 137.0000 ug | ORAL_TABLET | Freq: Every day | ORAL | 3 refills | Status: DC

## 2022-11-09 MED ORDER — ROSUVASTATIN CALCIUM 20 MG PO TABS: 20.0000 mg | ORAL_TABLET | Freq: Every day | ORAL | 3 refills | Status: DC

## 2022-11-09 NOTE — Progress Notes (Unsigned)
Hypertension:    Using medication without problems or lightheadedness: yes Chest pain with exertion:no Edema:no Short of breath:no  Elevated Cholesterol: Using medications without problems:yes Muscle aches: no Diet compliance: d/w pt.   Exercise: d/w pt.    Hypothyroidism.  TSH wnl.  No dysphagia.  Compliant.    Flu prev done.  PNA up to date.   Tetanus 2010, d/w pt.   shingrix prev done.  covid vaccine prev done.   PSA wnl, d/w pt.   Colonoscopy 2024.   Advance directive d/w pt. Wife designated if patient were incapacitated AAA screening d/w pt.  Ordered 2024.   He has routine dermatology f/u.    Memory/concentration d/w pt.  Still on namenda, donepezil, adderall.  Gradually worse, with short term changes.  Repeating some questions.  No MVA.    Botox per outside clinic.    He had weight loss attributed to diarrhea.  Diarrhea is better in the meantime.   Meds, vitals, and allergies reviewed.   PMH and SH reviewed  ROS: Per HPI unless specifically indicated in ROS section   GEN: nad, alert HEENT: ncat NECK: supple w/o LA CV: rrr. PULM: ctab, no inc wob ABD: soft, +bs EXT: no edema SKIN: no acute rash

## 2022-11-09 NOTE — Patient Instructions (Signed)
Let me know if you don't get a call about the AAA ultrasound for screening.  Take care.  Glad to see you. Don't change your meds for now.

## 2022-11-10 NOTE — Assessment & Plan Note (Signed)
Flu prev done.  PNA up to date.   Tetanus 2010, d/w pt.   shingrix prev done.  covid vaccine prev done.   PSA wnl, d/w pt.   Colonoscopy 2024.   Advance directive d/w pt. Wife designated if patient were incapacitated AAA screening d/w pt.  Ordered 2024.

## 2022-11-10 NOTE — Assessment & Plan Note (Signed)
TSH wnl.  No dysphagia.  Compliant.  Continue levothyroxine as is.

## 2022-11-10 NOTE — Assessment & Plan Note (Signed)
Continue Crestor.  Continue work on diet and exercise. ?

## 2022-11-10 NOTE — Progress Notes (Signed)
Remote ICD transmission.   

## 2022-11-10 NOTE — Assessment & Plan Note (Signed)
Continue metoprolol diltiazem, diet and exercise.  Labs discussed with patient.

## 2022-11-10 NOTE — Assessment & Plan Note (Signed)
Advance directive d/w pt.  Wife designated if patient were incapacitated.   

## 2022-11-10 NOTE — Assessment & Plan Note (Signed)
Memory/concentration d/w pt.  Still on namenda, donepezil, adderall.  Gradually worse, with short term changes.  Repeating some questions.  No MVA. no acute changes.  I think it makes sense to continue with compensatory strategy, making lists, etc. as he is already doing.  Driving cautions discussed with patient.  No change in medications at this point.  He agrees to plan.  Recheck periodically.

## 2022-11-15 ENCOUNTER — Encounter: Payer: Self-pay | Admitting: Family Medicine

## 2022-11-15 DIAGNOSIS — M47812 Spondylosis without myelopathy or radiculopathy, cervical region: Secondary | ICD-10-CM | POA: Diagnosis not present

## 2022-11-15 DIAGNOSIS — M542 Cervicalgia: Secondary | ICD-10-CM | POA: Diagnosis not present

## 2022-11-17 ENCOUNTER — Other Ambulatory Visit: Payer: Self-pay | Admitting: Family Medicine

## 2022-11-30 ENCOUNTER — Ambulatory Visit: Payer: Medicare Other

## 2022-12-07 ENCOUNTER — Ambulatory Visit
Admission: RE | Admit: 2022-12-07 | Discharge: 2022-12-07 | Disposition: A | Discharge status: Home / Self Care | Payer: Medicare Other | Source: Ambulatory Visit | Attending: Family Medicine | Admitting: Family Medicine

## 2022-12-07 DIAGNOSIS — Z136 Encounter for screening for cardiovascular disorders: Secondary | ICD-10-CM | POA: Diagnosis not present

## 2022-12-07 DIAGNOSIS — F17210 Nicotine dependence, cigarettes, uncomplicated: Secondary | ICD-10-CM | POA: Diagnosis not present

## 2022-12-22 DIAGNOSIS — H6121 Impacted cerumen, right ear: Secondary | ICD-10-CM | POA: Diagnosis not present

## 2022-12-22 DIAGNOSIS — H903 Sensorineural hearing loss, bilateral: Secondary | ICD-10-CM | POA: Diagnosis not present

## 2022-12-22 DIAGNOSIS — H9312 Tinnitus, left ear: Secondary | ICD-10-CM | POA: Diagnosis not present

## 2023-01-07 ENCOUNTER — Ambulatory Visit (INDEPENDENT_AMBULATORY_CARE_PROVIDER_SITE_OTHER): Payer: Medicare Other

## 2023-01-07 DIAGNOSIS — I4729 Other ventricular tachycardia: Secondary | ICD-10-CM | POA: Diagnosis not present

## 2023-01-11 LAB — CUP PACEART REMOTE DEVICE CHECK
Battery Remaining Longevity: 18 mo
Battery Remaining Percentage: 20 %
Brady Statistic RV Percent Paced: 1 %
Date Time Interrogation Session: 20240708155200
HighPow Impedance: 67 Ohm
Implantable Lead Connection Status: 753985
Implantable Lead Implant Date: 19990204
Implantable Lead Location: 753860
Implantable Lead Model: 135
Implantable Lead Serial Number: 301213
Implantable Pulse Generator Implant Date: 20111228
Lead Channel Impedance Value: 888 Ohm
Lead Channel Pacing Threshold Amplitude: 1.4 V
Lead Channel Pacing Threshold Pulse Width: 1 ms
Lead Channel Setting Pacing Amplitude: 2.4 V
Lead Channel Setting Pacing Pulse Width: 1 ms
Lead Channel Setting Sensing Sensitivity: 0.4 mV
Pulse Gen Serial Number: 277857

## 2023-01-14 DIAGNOSIS — H43813 Vitreous degeneration, bilateral: Secondary | ICD-10-CM | POA: Diagnosis not present

## 2023-01-14 DIAGNOSIS — Z961 Presence of intraocular lens: Secondary | ICD-10-CM | POA: Diagnosis not present

## 2023-01-18 ENCOUNTER — Telehealth: Payer: Self-pay | Admitting: Family Medicine

## 2023-01-18 NOTE — Telephone Encounter (Signed)
Prescription Request  01/18/2023  LOV: 11/09/2022  What is the name of the medication or equipment? amphetamine-dextroamphetamine (ADDERALL) 10 MG tablet   Have you contacted your pharmacy to request a refill? Yes   Which pharmacy would you like this sent to?   [facility] [address] [facility] - [place], [place] - [address] AT [address] [facility] [address] [place] [address] Phone: [phone] Fax: [phone]     Patient notified that their request is being sent to the clinical staff for review and that they should receive a response within 2 business days.   Please advise at [facility] [phone]

## 2023-01-18 NOTE — Telephone Encounter (Signed)
LOV - 11/09/22 NOV - not scheduled RF - 10/20/22 #180/0

## 2023-01-19 MED ORDER — AMPHETAMINE-DEXTROAMPHETAMINE 10 MG PO TABS: 10.0000 mg | ORAL_TABLET | Freq: Two times a day (BID) | ORAL | 0 refills | Status: DC

## 2023-01-19 NOTE — Telephone Encounter (Signed)
Sent. Thanks.   

## 2023-01-26 NOTE — Progress Notes (Signed)
Remote ICD transmission.   

## 2023-02-07 ENCOUNTER — Ambulatory Visit: Payer: Medicare Other | Admitting: Neurology

## 2023-02-07 VITALS — BP 148/93

## 2023-02-07 DIAGNOSIS — G243 Spasmodic torticollis: Secondary | ICD-10-CM | POA: Diagnosis not present

## 2023-02-07 MED ORDER — ONABOTULINUMTOXINA 200 UNITS IJ SOLR
200.0000 [IU] | Freq: Once | INTRAMUSCULAR | Status: AC
Start: 2023-02-07 — End: 2023-02-07
  Administered 2023-02-07: 200 [IU] via INTRAMUSCULAR

## 2023-02-07 NOTE — Progress Notes (Signed)
   Gait abnormality, worsening neck pain, limited range of motion, abnormal neck posturing, mild anterocollis, right tilt, significant tenderness along cervical paraspinal muscles, worse on the right side   CT cervical spine in June 2023, multilevel degenerative changes, severe right-sided C1-C2 osteoarthritis, variable degree of foraminal narrowing, no significant canal stenosis  He failed epidural injection, complains of increased confusion dizziness, unsteadiness with tizanidine, tramadol treatment,  Responded well to EMG guided Botox injection    He did well with previous Botox injection for his right neck pain, no significant side effect noted, better range of motion  EMG guided Botox injection, used 200 units today (100 units dissolving to 2 cc of normal saline)  Right levator scapular 25 Left levator scapula 25 units  Right longissimus capitis 25 units Right semispinalis 25 units Right splenius capitis 25 units Right splenius cervix 25 unitsx2=50 Right iliocostalis 25 units  He tolerated injection well, will return to clinic in 3 months for repeat injection   Larry [name], M.D. Ph.D.  [facility] [address] [place], [place] [address] Phone: [phone] Fax:      [phone]

## 2023-02-07 NOTE — Progress Notes (Signed)
Botox- 200 units x 1 vial Lot: [id number] Expiration: 2026/11 NDC: 0023-3921-02  Bacteriostatic 0.9% Sodium Chloride- 4mL  Lot: [id number] Expiration: 05/11/24 NDC: 2440-1027-25  Dx: D66.4, M54.2 B/B Witnessed by Karma Lew RN

## 2023-02-21 DIAGNOSIS — Z85820 Personal history of malignant melanoma of skin: Secondary | ICD-10-CM | POA: Diagnosis not present

## 2023-02-21 DIAGNOSIS — Z1283 Encounter for screening for malignant neoplasm of skin: Secondary | ICD-10-CM | POA: Diagnosis not present

## 2023-02-21 DIAGNOSIS — D225 Melanocytic nevi of trunk: Secondary | ICD-10-CM | POA: Diagnosis not present

## 2023-02-21 DIAGNOSIS — L570 Actinic keratosis: Secondary | ICD-10-CM | POA: Diagnosis not present

## 2023-02-21 DIAGNOSIS — Z08 Encounter for follow-up examination after completed treatment for malignant neoplasm: Secondary | ICD-10-CM | POA: Diagnosis not present

## 2023-02-21 DIAGNOSIS — X32XXXD Exposure to sunlight, subsequent encounter: Secondary | ICD-10-CM | POA: Diagnosis not present

## 2023-02-21 DIAGNOSIS — L820 Inflamed seborrheic keratosis: Secondary | ICD-10-CM | POA: Diagnosis not present

## 2023-02-28 DIAGNOSIS — Z961 Presence of intraocular lens: Secondary | ICD-10-CM | POA: Diagnosis not present

## 2023-02-28 DIAGNOSIS — H43813 Vitreous degeneration, bilateral: Secondary | ICD-10-CM | POA: Diagnosis not present

## 2023-04-05 ENCOUNTER — Other Ambulatory Visit: Payer: Self-pay | Admitting: Family Medicine

## 2023-04-05 DIAGNOSIS — E039 Hypothyroidism, unspecified: Secondary | ICD-10-CM

## 2023-04-11 ENCOUNTER — Ambulatory Visit: Payer: Medicare Other

## 2023-04-11 DIAGNOSIS — Z8674 Personal history of sudden cardiac arrest: Secondary | ICD-10-CM

## 2023-04-12 LAB — CUP PACEART REMOTE DEVICE CHECK
Battery Remaining Longevity: 12 mo
Battery Remaining Percentage: 16 %
Brady Statistic RV Percent Paced: 1 %
Date Time Interrogation Session: 20241008155900
HighPow Impedance: 69 Ohm
Implantable Lead Connection Status: 753985
Implantable Lead Implant Date: 19990204
Implantable Lead Location: 753860
Implantable Lead Model: 135
Implantable Lead Serial Number: 301213
Implantable Pulse Generator Implant Date: 20111228
Lead Channel Impedance Value: 960 Ohm
Lead Channel Pacing Threshold Amplitude: 1.4 V
Lead Channel Pacing Threshold Pulse Width: 1 ms
Lead Channel Setting Pacing Amplitude: 2.4 V
Lead Channel Setting Pacing Pulse Width: 1 ms
Lead Channel Setting Sensing Sensitivity: 0.4 mV
Pulse Gen Serial Number: 277857

## 2023-04-26 NOTE — Progress Notes (Signed)
Remote ICD transmission.   

## 2023-04-27 ENCOUNTER — Telehealth: Payer: Self-pay | Admitting: Family Medicine

## 2023-04-27 ENCOUNTER — Other Ambulatory Visit: Payer: Self-pay | Admitting: Family Medicine

## 2023-04-27 NOTE — Telephone Encounter (Signed)
Sent. Thanks.   

## 2023-04-27 NOTE — Telephone Encounter (Signed)
Last OV: 11/09/2022  Pending OV: Nothing scheduled at this time Medication: Adderall 10mg  Directions: Take one tablet BID Last Refill: 01/19/2023 Qty: #180 with 0 refills

## 2023-04-27 NOTE — Telephone Encounter (Signed)
Duplicate message one sent under med refill.

## 2023-04-27 NOTE — Telephone Encounter (Signed)
Prescription Request  04/27/2023  LOV: 11/09/2022  What is the name of the medication or equipment? amphetamine-dextroamphetamine (ADDERALL) 10 MG tablet   Have you contacted your pharmacy to request a refill? Yes   Which pharmacy would you like this sent to?  [facility] [address] [facility] - [place], [place] - [address] [facility] [address] [place] [address] Phone: [phone] Fax: [phone]     Patient notified that their request is being sent to the clinical staff for review and that they should receive a response within 2 business days.   Please advise at [facility] [phone]

## 2023-05-02 ENCOUNTER — Ambulatory Visit: Payer: Medicare Other | Admitting: Neurology

## 2023-05-02 ENCOUNTER — Telehealth: Payer: Self-pay | Admitting: Neurology

## 2023-05-02 VITALS — BP 159/92 | HR 63

## 2023-05-02 DIAGNOSIS — G243 Spasmodic torticollis: Secondary | ICD-10-CM

## 2023-05-02 MED ORDER — ONABOTULINUMTOXINA 200 UNITS IJ SOLR
200.0000 [IU] | Freq: Once | INTRAMUSCULAR | Status: AC
Start: 2023-05-02 — End: 2023-05-02
  Administered 2023-05-02: 200 [IU] via INTRAMUSCULAR

## 2023-05-02 NOTE — Progress Notes (Signed)
   Gait abnormality, worsening neck pain, limited range of motion, abnormal neck posturing, mild anterocollis, right tilt, significant tenderness along cervical paraspinal muscles, worse on the right side   CT cervical spine in June 2023, multilevel degenerative changes, severe right-sided C1-C2 osteoarthritis, variable degree of foraminal narrowing, no significant canal stenosis  He failed epidural injection, complains of increased confusion dizziness, unsteadiness with tizanidine, tramadol treatment,  Responded well to EMG guided Botox injection    He did well with previous Botox injection for his right neck pain, no significant side effect noted, better range of motion  EMG guided Botox injection, used 200 units today (100 units dissolving to 2 cc of normal saline)  Right levator scapular 25 Left levator scapula 25 units  Right longissimus capitis 25 units Right semispinalis 25 units Right splenius capitis 25 units Right splenius cervix 25 units  Right iliocostalis 25 units  Left semispinalis 25 units  He tolerated injection well, will return to clinic in 3 months for repeat injection, will decrease Botox from 200 to 100 units at next injection   Levert Feinstein, M.D. Ph.D.  [facility] [address] [place], [place] [address] Phone: [phone] Fax:      [phone]

## 2023-05-02 NOTE — Telephone Encounter (Signed)
[employer] auth for 100 units: [id number] (05/02/23-05/01/24).

## 2023-05-02 NOTE — Progress Notes (Signed)
Botox- 100 units x 2 vial Lot: [id number] Expiration: 12.2026 NDC: 0023-3921-02  Bacteriostatic 0.9% Sodium Chloride-  mL  Lot: [id number] Expiration: 04.01.2025 NDC: 2130865784  Dx: G24.3 B/B Witnessed by Aundra Millet, CMA

## 2023-05-02 NOTE — Telephone Encounter (Signed)
Change to BOTOX A 100 units (from 200 units) on next injection in Jan 2025.

## 2023-05-11 ENCOUNTER — Other Ambulatory Visit: Payer: Self-pay

## 2023-05-11 MED ORDER — METOPROLOL SUCCINATE ER 25 MG PO TB24: 25.0000 mg | ORAL_TABLET | Freq: Every day | ORAL | 1 refills | Status: DC

## 2023-07-12 ENCOUNTER — Ambulatory Visit (INDEPENDENT_AMBULATORY_CARE_PROVIDER_SITE_OTHER): Payer: Medicare Other

## 2023-07-12 DIAGNOSIS — Z8674 Personal history of sudden cardiac arrest: Secondary | ICD-10-CM

## 2023-07-13 LAB — CUP PACEART REMOTE DEVICE CHECK
Battery Remaining Longevity: 12 mo
Battery Remaining Percentage: 15 %
Brady Statistic RV Percent Paced: 1 %
Date Time Interrogation Session: 20250107032100
HighPow Impedance: 65 Ohm
Implantable Lead Connection Status: 753985
Implantable Lead Implant Date: 19990204
Implantable Lead Location: 753860
Implantable Lead Model: 135
Implantable Lead Serial Number: 301213
Implantable Pulse Generator Implant Date: 20111228
Lead Channel Impedance Value: 963 Ohm
Lead Channel Pacing Threshold Amplitude: 1.4 V
Lead Channel Pacing Threshold Pulse Width: 1 ms
Lead Channel Setting Pacing Amplitude: 2.4 V
Lead Channel Setting Pacing Pulse Width: 1 ms
Lead Channel Setting Sensing Sensitivity: 0.4 mV
Pulse Gen Serial Number: 277857

## 2023-07-26 ENCOUNTER — Telehealth: Payer: Self-pay | Admitting: Neurology

## 2023-07-26 NOTE — Telephone Encounter (Signed)
Appointment details confirmed

## 2023-08-02 ENCOUNTER — Ambulatory Visit: Payer: Medicare Other | Admitting: Neurology

## 2023-08-02 DIAGNOSIS — R413 Other amnesia: Secondary | ICD-10-CM

## 2023-08-02 DIAGNOSIS — G243 Spasmodic torticollis: Secondary | ICD-10-CM | POA: Diagnosis not present

## 2023-08-02 DIAGNOSIS — M542 Cervicalgia: Secondary | ICD-10-CM

## 2023-08-02 DIAGNOSIS — G931 Anoxic brain damage, not elsewhere classified: Secondary | ICD-10-CM

## 2023-08-02 MED ORDER — ONABOTULINUMTOXINA 100 UNITS IJ SOLR
100.0000 [IU] | Freq: Once | INTRAMUSCULAR | Status: AC
Start: 2023-08-02 — End: 2023-08-02
  Administered 2023-08-02: 100 [IU] via INTRAMUSCULAR

## 2023-08-02 MED ORDER — DONEPEZIL HCL 10 MG PO TABS
10.0000 mg | ORAL_TABLET | Freq: Every day | ORAL | 3 refills | Status: DC
Start: 2023-08-02 — End: 2024-04-06

## 2023-08-02 MED ORDER — MEMANTINE HCL 10 MG PO TABS: 10.0000 mg | ORAL_TABLET | Freq: Two times a day (BID) | ORAL | 3 refills | Status: DC

## 2023-08-02 MED ORDER — LAMOTRIGINE 100 MG PO TABS: 100.0000 mg | ORAL_TABLET | Freq: Two times a day (BID) | ORAL | 3 refills | Status: DC

## 2023-08-02 NOTE — Progress Notes (Signed)
Botox- 100 units x 1 vial Lot: [id number] Expiration: 10/2025 NDC: 4540-9811-91  Bacteriostatic 0.9% Sodium Chloride- 2 mL  Lot: [id number] Expiration: 05/05/2024 NDC: 6213-0865-78  Dx: Mayer Camel B/B Witnessed by Duffy Bruce CMA

## 2023-08-02 NOTE — Progress Notes (Signed)
ASSESSMENT AND PLAN 73 y.o. year old male    Anoxic brain injury in 1999 due to prolonged cardiac arrest,  With residual mild cognitive impairment  Slow worsening over the years  Continue Aricept 10 mg daily, Namenda 10 mg twice a day     Seizure  Most recent in June 2019, EEG was normal  Continue lamotrigine 100 mg twice a day  Gait abnormality, worsening neck pain, limited range of motion, abnormal neck posturing, mild anterocollis, right tilt, significant tenderness along cervical paraspinal muscles, worse on the right side   CT cervical spine in June 2023, multilevel degenerative changes, severe right-sided C1-C2 osteoarthritis, variable degree of foraminal narrowing, no significant canal stenosis  He failed epidural injection, complains of increased confusion dizziness, unsteadiness with tizanidine, tramadol treatment,  Responded well to low-dose Botox injection,  Refer to physical therapy    EMG guided Botox injection, used 100 units today (100 units dissolving to 2 cc of normal saline)  Right levator scapular 25 units Right longissimus capitis 12.5 units Right semispinalis 12.5 units Right splenius capitis 12.5 units Right splenius cervix 12.5 units Right iliocostalis 12.5 units Right upper trapezius 12.5 units  He tolerated injection well, will return to clinic in 3 months for repeat injection  DIAGNOSTIC DATA (LABS, IMAGING, TESTING) - I reviewed patient records, labs, notes, testing and imaging myself where available.   HISTORY OF PRESENT ILLNESS:  Larry [name] is a very friendly 73 year old right-handed gentleman presents for followup consultation of his memory loss in the context of anoxic brain injury.    He is accompanied by his wife today. He was a patient of Dr. Sandria Manly, The patient has an underlying medical history of cardiac death from ventricular fibrillation with hypoxic brain injury in Jan 1999 due to arrythmia, 15 minutes without heart beat,  status post  defibrillator placement, He stayed in hospital for 35 days, required prolonged rehabilitation, has to relearn walking again, significant amnesia, "as if that he did not have a past".   EEG showed diffuse slowing in January 1999, CT head and brain MRI were normal. CT head repeat in November 2000 showed slight prominence of the lateral and third ventricles. CT head in July 2003 showed prominence of the ventricular system. Psychological testing and August 1999 showed borderline performance IQ. He became depressed. In January 2014 his MMSE was 29, clock drawing was 4, animal fluency was 10.  He went to [facility] for 2 years for patient with degenerative disorder.   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 He lives at home with his wife, still driving.   He helps the household, he still has difficulty focusing.    He has had some fluctuation in his memory per wife. He has ADD and continues to take Adderall, 10 mg twice a day, without Adderall, he could not remember  the pages that he has read,he is also taking Aricept 10 mg every day, which has been very helpful too.   Up date April 14 2016YY   His overall doing very well, he likes play computer games, which has helped his eye and hands coordination, he had recent trip to [place], felt overwhelming, he continued to drive short distance,   He is taking Ritalin 10 mg twice a day, Aricept 10 mg once a day, complains of insomnia, he took his last dose of Ritalin at evening time.   UPDATE Apr 21 2015:YY He is  now taking Namenda 10mg  bid and aricept 10mg  qday, he still has trouble sleeping,  He watch movie, stay up late, he continues to have focus difficulty, tends to become agitated.   UPDATE April 27th 2017: YY He still has chronic insomnia, tends to have irregular sleep pattern, stay up late watching TV, he still very active during the day, take Adderall 10 mg twice a day, still  drives short distance, mild unsteady gait, tends to hold his left arm to his chest   UPDATE Oct 17th 2017:YY He is with his wife,  Her daughter was hospital, presented with sudden loss of consciousness yesterday April 19 2016, which has brought back a lot of his memory, he has not tried clonazepam for chronic insomnia, worry about side effect, tried melatonin, still has significant insomnia, he is taking Tylenol pm,    UPDATE March 02 2018: He was follow-up by [facility] in 2018, on December 19, 2017, visiting his daughter at [place], in the morning time, he had sudden onset dizziness, feels like going to pass out, then had transient loss of consciousness, eyes rolled back, staring, lasting for 1 minutes, followed by postevent mild confusion and slurred speech last about 40 minutes, was treated at local hospital.   I personally reviewed CT head without contrast: There was no acute abnormality, generalized atrophy, ventriculomegaly,  laboratory evaluation, EKG showed no significant abnormality.   Similar spells in March 2019, transient dizziness followed by sudden loss of muscle tone, consciousness, extreme fatigue, slept for 1 hour afterwards,   Patient reported that he actually has frequent spells about once a month, he has transient confusion, feels like he is going to pass out, short lasting,   UPDATE Jun 05 2018: He tolerated lamotrigine very well, taking 100 mg twice a day without significant side effect,   EEG was normal.  Since lamotrigine, he no longer has recurrent spells of rising sensation transient confusion even though there was no clinical seizure activity observed during those spells, he had those spells about once a week prior to lamotrigine,  UPDATE Jun 19 2020: Is accompanied by his wife at today's clinical visit, slow worsening memory loss, when he goes to a different room, he often forgets why is he there, slight worsening gait abnormality, he also complains of worsening tinnitus,  difficulty sleeping sometimes,  I personally reviewed CT temporal bone w/wo in April 2021:  1. Negative CT appearance of the bilateral temporal bones. No explanation for hearing loss. 2. Note a mildly high riding right jugular bulb, with thin but intact overlying bony covering. 3. Hyperplastic but clear visible paranasal sinuses.  UPDATE March 29th 2022: He used to have headache even before his cardiac arrest, only occasionally, mild, his 2 daughter does has typical migraine headache,  Over the years, he has occasionally headache usually short lasting, on March 26, he complains of transient sharp pain shooting from the left parietal region downward, lasting for few seconds, then had a trickle of pressure headache, take ibuprofen, which did improve  Next morning March 27 Sunday, while at church, at the end of the sermon, he was noted to holding his left side, he also noticed transient blurry vision, was brought to the emergency room,  I personally reviewed CT head with contrast, no acute intracranial abnormality, no venous sinus thrombosis, arterial branch that was visualized showed no significant abnormality  Laboratory showed normal ESR, negative troponin, normal CBC, BMP with exception of elevated glucose 140, was seen by ophthalmology today, no significant abnormality  found, was told he has cataract, left side was slightly worse than the right  Update June 14th 2022: I reviewed his headache diary, few episode in March, transient sharp, only 1 episode on October 19, 2020, no longer has frequent headaches, reported worsening vision bilaterally, left worse than right, on today's near chart examination, visual acuity was 20/50 OS/OD,  Also complains of worsening gait abnormality, wife noticed worsening memory loss,  We personally reviewed CT of temporal bone with without contrast in April 2021 for evaluation of his reported hearing loss, no acute abnormality, CT venogram of brain on September 28, 2020 that was normal  Update July 02, 2021: Is accompanied by his wife, continued slow worsening, increased memory loss, slow reaction time, continue to drive, wife has to be vigilant sitting at the passenger side, MoCA examination 22/30 today,  Over the past few months, he had few episode of transient ice pricking pain, not long-lasting, He also complains of back pain, rigidity, decreased range of motion  UPDATE December 21 2021: He continues to have slowing memory loss, MoCA examination 25/30, actually improved from previous memory of 22/30, he has slower reaction time, take time to figure out things,  He did have a history of obstructive sleep apnea, tried different facial mask, including nasal cannula in the past, could not tolerate it,  He also complains of worsening neck pain, stiffness, limited range of motion of his neck, somewhat improvement by physical therapy and dry needling, he denies bowel and bladder incontinence  Personally reviewed x-ray of cervical spine, mild to moderate multilevel cervical degenerative disc disease worse at C6-7  UPDATE Jun 15 2022: He is with his wife, complains of worsening short term memory loss, difficulty multitasking, feel frustrated easily,  He complains of worsening neck pain, difficulty turning his neck, 8 out of 10 constant achy pain, worsening shooting pain to right shoulder, occipital region moving his neck, especially looking to the left, underwent epidural injection without help his symptoms much.  UPDATE Aug 11 2022: Here for his first EMG guided Botox a injection for severe neck pain, especially at the right side CT cervical spine June 2023, multilevel degenerative changes, severe right-sided C1-C2 osteoarthritis, tight right cervical paraspinal muscles, tenderness upon deep palpitation,  UPDATE Nov 08 2022: Moderate improvement of his neck pain following his initial Botox injection in February 2024, used 200 units, no ill effect  noticed  Wife complains of worsening cognitive impairment, climbing up a ladder, almost fell  UPDATE Jan 28th 2025: Every 3 months low-dose Botox injection did help his neck pain, no significant side effect, he tends to have right cervical paraspinal muscle tension, achy pain  Wife reported to him having worsening memory loss, gait abnormality, was not sure about the benefit of Aricept, Namenda, but tolerating it well  REVIEW OF SYSTEMS: Out of a complete 14 system review of symptoms, the patient complains only of the following symptoms, and all other reviewed systems are negative.  Memory loss, seizures  PHYSICAL EXAM  Vitals:   06/15/22 1304  BP: (!) 142/89  Pulse: 78  Weight: 179 lb 6.4 oz (81.4 kg)  Height: 5\' 9"  (1.753 m)   Body mass index is 26.49 kg/m.  Generalized: Well developed, in no acute distress     06/15/2022    1:06 PM 12/21/2021   12:56 PM 07/02/2021   10:30 AM  Montreal Cognitive Assessment   Visuospatial/ Executive (0/5) 4 4 3   Naming (0/3) 3 3 3   Attention: Read list  of digits (0/2) 2 2 2   Attention: Read list of letters (0/1) 1 1 1   Attention: Serial 7 subtraction starting at 100 (0/3) 3 3 2   Language: Repeat phrase (0/2) 1 1 1   Language : Fluency (0/1) 1 1 1   Abstraction (0/2) 2 2 2   Delayed Recall (0/5) 1 3 2   Orientation (0/6) 5 5 5   Total 23 25 22   Adjusted Score (based on education) 23       NEUROLOGICAL EXAM:  Neck: Limited range of motion, of neck, looking to either left or right, tenderness of cervical paraspinal muscles upon deep palpation, especially right levator scapula, upper and lower cervical regions, neck tilt to right shoulder, anterocollis,  MENTAL STATUS: Speech/Cognition: Slow reaction time time, cooperative on examination  CRANIAL NERVES: CN II: Visual fields are full to confrontation.  Pupils are small, reactive CN III, IV, VI: extraocular movement are normal. No ptosis. CN V: Facial sensation is intact to light  touch. CN VII: Face is symmetric with normal eye closure and smile. CN VIII: Hearing is normal to casual conversation CN IX, X: Palate elevates symmetrically. Phonation is normal. CN XI: Head turning and shoulder shrug are intact  MOTOR:  Fixation left upper extremity on rapid rotating movement  REFLEXES: Reflexes are 2  and symmetric at the biceps, triceps, knees and ankles. Plantar responses are flexor.  SENSORY: Intact to light touch, pinprick, positional and vibratory sensation at fingers and toes.  COORDINATION: There is no trunk or limb ataxia.    GAIT/STANCE: He needs push-up to get up from seated position, tends to hold his left arm in elbow flexion, mildly wide-based, unsteady   ALLERGIES: Allergies  Allergen Reactions   Penicillins     Difficult Breathing, rash    HOME MEDICATIONS: Outpatient Medications Prior to Visit  Medication Sig Dispense Refill   amphetamine-dextroamphetamine (ADDERALL) 10 MG tablet TAKE ONE TABLET BY MOUTH TWICE A DAY 180 tablet 0   Ascorbic Acid (VITAMIN C) 500 MG tablet Take 500 mg by mouth daily.     aspirin 81 MG tablet Take 81 mg by mouth daily.     botulinum toxin Type A (BOTOX) 200 units injection Inject 200 units into the muscles every 3 months 1 each 3   Cholecalciferol (VITAMIN D3) 10 MCG (400 UNIT) tablet Take 400 Units by mouth daily.     diltiazem (CARDIZEM CD) 120 MG 24 hr capsule TAKE 1 CAPSULE BY MOUTH DAILY 90 capsule 3   donepezil (ARICEPT) 10 MG tablet TAKE 1 TABLET BY MOUTH DAILY 60 tablet 5   fexofenadine (ALLEGRA) 180 MG tablet Take 180 mg by mouth daily.     Ginkgo Biloba 40 MG TABS Take by mouth as directed.     lamoTRIgine (LAMICTAL) 100 MG tablet TAKE 1 TABLET BY MOUTH TWICE  DAILY 120 tablet 5   levothyroxine (SYNTHROID) 137 MCG tablet Take 1 tablet (137 mcg total) by mouth daily before breakfast. 100 tablet 3   Melatonin 5 MG CAPS Take by mouth as directed.     memantine (NAMENDA) 10 MG tablet TAKE 1 TABLET BY  MOUTH TWICE  DAILY 120 tablet 5   metoprolol succinate (TOPROL-XL) 25 MG 24 hr tablet Take 1 tablet (25 mg total) by mouth daily. Will need visit for further refills. 100 tablet 1   montelukast (SINGULAIR) 10 MG tablet Take 1 tablet (10 mg total) by mouth daily. 90 tablet 3   Multiple Vitamin (MULTIVITAMIN) tablet Take 700 tablets by mouth daily.  NON FORMULARY Focus factor 1 tab po bid     rosuvastatin (CRESTOR) 20 MG tablet Take 1 tablet (20 mg total) by mouth at bedtime. 90 tablet 3   tiZANidine (ZANAFLEX) 4 MG tablet Take 2-4 mg by mouth 2 (two) times daily as needed.     traMADol (ULTRAM) 50 MG tablet Take 50 mg by mouth 2 (two) times daily as needed.     vitamin B-12 (CYANOCOBALAMIN) 1000 MCG tablet Take 1,000 mcg by mouth daily.     vitamin E 180 MG (400 UNITS) capsule Take 400 Units by mouth daily.     No facility-administered medications prior to visit.    PAST MEDICAL HISTORY: Past Medical History:  Diagnosis Date   ADD (attention deficit disorder)    Allergy    seasonal flares of perinneal allergies   Cancer (HCC)    melanoma   Cardiac arrest (HCC) 07/1997   aborted   Dementia (HCC)    post resusitative   Depression    Diverticulosis    Family hx of prostate cancer    GERD (gastroesophageal reflux disease)    Headache(784.0)    Hyperlipidemia    Hypothyroidism    ICD (implantable cardiac defibrillator) in place    Idiopathic urticaria    Internal hemorrhoids    Low back pain syndrome    Melanoma in situ of skin of trunk (HCC)    chest   PVC (premature ventricular contraction)    associated with cardiac arrest   Sleep apnea    no c-pap   Tubular adenoma of colon 10/2010    PAST SURGICAL HISTORY: Past Surgical History:  Procedure Laterality Date    implantation x2     CARDIAC DEFIBRILLATOR PLACEMENT  07/01/2010   Explanation of a previously implanted device, pocket revision, and insertion of a new device, and intraoperative defibrillation threshold  testing [name])   CARDIAC DEFIBRILLATOR REMOVAL  07/05/2002   Defibrillator change [name]) 3 times changed   CATARACT EXTRACTION W/PHACO Left 02/04/2021   Procedure: CATARACT EXTRACTION PHACO AND INTRAOCULAR LENS PLACEMENT (IOC) LEFT VIVITY toric LENS 9.21 01:11.6;  Surgeon: Lockie Mola, MD;  Location: [facility];  Service: Ophthalmology;  Laterality: Left;  keep patient at 11:30 arrival   CATARACT EXTRACTION W/PHACO Right 03/04/2021   Procedure: CATARACT EXTRACTION PHACO AND INTRAOCULAR LENS PLACEMENT (IOC) RIGHT VIVITY LENS 8.59 01:15.3;  Surgeon: Lockie Mola, MD;  Location: [facility];  Service: Ophthalmology;  Laterality: Right;   COLONOSCOPY     HEMORRHOID SURGERY     ICD     TONSILLECTOMY      FAMILY HISTORY: Family History  Problem Relation Age of Onset   Heart disease Mother    Hypertension Mother    Heart disease Father    Diverticulosis Father    Coronary artery disease Father    Alopecia Father    Prostate cancer Brother    Diabetes Brother    Heart disease Brother        heart transplant   Lung cancer Paternal Grandfather        Smoker   Cancer Paternal Grandfather        lung cancer - smoker   Colon cancer Neg Hx    Esophageal cancer Neg Hx    Pancreatic cancer Neg Hx    Stomach cancer Neg Hx     SOCIAL HISTORY: Social History   Socioeconomic History   Marital status: Married    Spouse name: [name]   Number of children: 2  Years of education: College   Highest education level: Not on file  Occupational History    Employer: DISABLED  Tobacco Use   Smoking status: Former    Current packs/day: 0.00    Average packs/day: 1 pack/day for 10.0 years (10.0 ttl pk-yrs)    Types: Cigarettes    Start date: 07/05/1985    Quit date: 07/06/1995    Years since quitting: 28.0   Smokeless tobacco: Former    Types: Chew    Quit date: 11/04/1994  Vaping Use   Vaping status: Never Used  Substance and Sexual Activity   Alcohol use: No     Alcohol/week: 0.0 standard drinks of alcohol    Comment: socially   Drug use: No   Sexual activity: Not on file  Other Topics Concern   Not on file  Social History Narrative   Patient is married [name]) [phone] with 2 daughters   5 grandchildren   Daily caffeine use - one cup of coffee and one-two cokes per day   Patient is right-handed.   Patient has a college education   Social Drivers of Corporate investment banker Strain: Low Risk  (10/07/2022)   Overall Financial Resource Strain (CARDIA)    Difficulty of Paying Living Expenses: Not hard at all  Food Insecurity: No Food Insecurity (10/07/2022)   Hunger Vital Sign    Worried About Running Out of Food in the Last Year: Never true    Ran Out of Food in the Last Year: Never true  Transportation Needs: No Transportation Needs (10/07/2022)   PRAPARE - Administrator, Civil Service (Medical): No    Lack of Transportation (Non-Medical): No  Physical Activity: Inactive (10/07/2022)   Exercise Vital Sign    Days of Exercise per Week: 0 days    Minutes of Exercise per Session: 0 min  Stress: No Stress Concern Present (10/07/2022)   [employer] of Occupational Health - Occupational Stress Questionnaire    Feeling of Stress : Not at all  Social Connections: Socially Integrated (10/07/2022)   Social Connection and Isolation Panel [NHANES]    Frequency of Communication with Friends and Family: More than three times a week    Frequency of Social Gatherings with Friends and Family: More than three times a week    Attends Religious Services: More than 4 times per year    Active Member of [employer] or Organizations: Yes    Attends Banker Meetings: More than 4 times per year    Marital Status: Married  Catering manager Violence: Not At Risk (10/07/2022)   Humiliation, Afraid, Rape, and Kick questionnaire    Fear of Current or Ex-Partner: No    Emotionally Abused: No    Physically Abused: No    Sexually Abused: No      Levert Feinstein, M.D. Ph.D.  [facility] [address] [place], [place] [address] Phone: [phone] Fax:      [phone]

## 2023-08-03 ENCOUNTER — Encounter: Payer: Self-pay | Admitting: Internal Medicine

## 2023-08-04 ENCOUNTER — Other Ambulatory Visit: Payer: Self-pay | Admitting: Family Medicine

## 2023-08-19 ENCOUNTER — Other Ambulatory Visit: Payer: Self-pay | Admitting: Family Medicine

## 2023-08-23 NOTE — Addendum Note (Signed)
 Addended by: Geralyn Flash D on: 08/23/2023 04:17 PM   Modules accepted: Orders

## 2023-08-23 NOTE — Progress Notes (Signed)
 Remote ICD transmission.

## 2023-08-25 ENCOUNTER — Other Ambulatory Visit: Payer: Self-pay | Admitting: Internal Medicine

## 2023-08-25 NOTE — Telephone Encounter (Signed)
 This is a Educational psychologist pt

## 2023-09-08 ENCOUNTER — Telehealth: Payer: Self-pay

## 2023-09-08 NOTE — Telephone Encounter (Signed)
 Call to patient, reports neck is improving but still needs tramadol at times. Advised to contact prescribing provider [facility])  first and if needed, will see if Dr. Terrace Arabia willing to prescribe. Patient appreciative of call

## 2023-09-08 NOTE — Telephone Encounter (Signed)
 Patient called in and advised that he has some questions for Dr Terrace Arabia :  A pain clinic prescribed tramadol for his neck. He is running out of this medication so he wanted to see if Dr Terrace Arabia would be able to rx this for him?  Patient states that Dr Terrace Arabia was going to order PT through [facility] - looks like the referral was sent to [facility]. This was sent on 2/5. I called and left a vm for them to check on the status of this referral. Requested a call back.  Patient can be called back at [phone] (cell phone).

## 2023-09-13 ENCOUNTER — Telehealth: Payer: Self-pay | Admitting: Neurology

## 2023-09-13 NOTE — Telephone Encounter (Signed)
 Pt called wanting to have an update on his PT referral. Please advise.

## 2023-10-06 ENCOUNTER — Ambulatory Visit: Attending: Neurology

## 2023-10-06 DIAGNOSIS — G931 Anoxic brain damage, not elsewhere classified: Secondary | ICD-10-CM | POA: Insufficient documentation

## 2023-10-06 DIAGNOSIS — M5382 Other specified dorsopathies, cervical region: Secondary | ICD-10-CM | POA: Diagnosis not present

## 2023-10-06 DIAGNOSIS — G243 Spasmodic torticollis: Secondary | ICD-10-CM | POA: Insufficient documentation

## 2023-10-06 DIAGNOSIS — M6281 Muscle weakness (generalized): Secondary | ICD-10-CM | POA: Diagnosis not present

## 2023-10-06 DIAGNOSIS — M542 Cervicalgia: Secondary | ICD-10-CM | POA: Diagnosis not present

## 2023-10-06 NOTE — Therapy (Signed)
 OUTPATIENT PHYSICAL THERAPY CERVICAL EVALUATION   Patient Name: Larry [name] MRN: [id number] DOB:Oct 12, 1950, 73 y.o., male Today's Date: 10/07/2023  END OF SESSION:  PT End of Session - 10/07/23 1108     Visit Number 1    Number of Visits 24    Date for PT Re-Evaluation 12/29/23    Progress Note Due on Visit 10    PT Start Time 1616    PT Stop Time 1701    PT Time Calculation (min) 45 min    Activity Tolerance Patient tolerated treatment well;No increased pain    Behavior During Therapy WFL for tasks assessed/performed             Past Medical History:  Diagnosis Date   ADD (attention deficit disorder)    Allergy    seasonal flares of perinneal allergies   Cancer (HCC)    melanoma   Cardiac arrest (HCC) 07/1997   aborted   Dementia (HCC)    post resusitative   Depression    Diverticulosis    Family hx of prostate cancer    GERD (gastroesophageal reflux disease)    Headache(784.0)    Hyperlipidemia    Hypothyroidism    ICD (implantable cardiac defibrillator) in place    Idiopathic urticaria    Internal hemorrhoids    Low back pain syndrome    Melanoma in situ of skin of trunk (HCC)    chest   PVC (premature ventricular contraction)    associated with cardiac arrest   Sleep apnea    no c-pap   Tubular adenoma of colon 10/2010   Past Surgical History:  Procedure Laterality Date    implantation x2     CARDIAC DEFIBRILLATOR PLACEMENT  07/01/2010   Explanation of a previously implanted device, pocket revision, and insertion of a new device, and intraoperative defibrillation threshold testing [name])   CARDIAC DEFIBRILLATOR REMOVAL  07/05/2002   Defibrillator change [name]) 3 times changed   CATARACT EXTRACTION W/PHACO Left 02/04/2021   Procedure: CATARACT EXTRACTION PHACO AND INTRAOCULAR LENS PLACEMENT (IOC) LEFT VIVITY toric LENS 9.21 01:11.6;  Surgeon: Lockie Mola, MD;  Location: [facility];  Service: Ophthalmology;  Laterality: Left;   keep patient at 11:30 arrival   CATARACT EXTRACTION W/PHACO Right 03/04/2021   Procedure: CATARACT EXTRACTION PHACO AND INTRAOCULAR LENS PLACEMENT (IOC) RIGHT VIVITY LENS 8.59 01:15.3;  Surgeon: Lockie Mola, MD;  Location: [facility];  Service: Ophthalmology;  Laterality: Right;   COLONOSCOPY     HEMORRHOID SURGERY     ICD     TONSILLECTOMY     Patient Active Problem List   Diagnosis Date Noted   Cervical dystonia 06/15/2022   Neck pain 07/02/2021   Gait abnormality 12/16/2020   Nonintractable headache 09/30/2020   Tinnitus 09/09/2019   History of sudden cardiac arrest 01/09/2019   Viral syndrome 09/06/2018   Seizures (HCC) 03/02/2018   Syncope 12/26/2017   Other social stressor 12/26/2017   Health care maintenance 08/14/2017   Family hx of prostate cancer    Allergy    Insomnia 10/20/2015   Advance care planning 03/08/2014   Memory loss 09/14/2013   Hypoxic brain injury (HCC) 09/14/2013   Medicare annual wellness visit, subsequent 02/16/2013   Shortness of breath 08/10/2011   Implantable cardioverter-defibrillator (ICD) in situ 02/03/2009   Hypothyroidism 11/18/2008   CONTACT DERMATITIS&OTHER ECZEMA DUE TO PLANTS 02/03/2008   IDIOPATHIC URTICARIA 02/03/2008   Hypertension 02/03/2008   HLD (hyperlipidemia) 10/27/2007   Attention deficit disorder 10/27/2007  PVC/VT non sustained 2007-11-08   Sleep apnea 11-08-07   HEADACHE 11/08/07   SUDDEN DEATH-aborted 2007-11-08   LOW BACK PAIN SYNDROME 10/11/2007    PCP: Dr. Crawford Givens  REFERRING PROVIDER: Dr. Levert Feinstein  REFERRING DIAG:  G24.3 (ICD-10-CM) - Cervical dystonia  M54.2 (ICD-10-CM) - Neck pain    THERAPY DIAG:   Rationale for Evaluation and Treatment: Rehabilitation  ONSET DATE: 2020  SUBJECTIVE:                                                                                                                                                                                                          SUBJECTIVE STATEMENT: Patient reports having long standing neck tightness/pain. Does receive botox injection with some relief yet very limited motion. If I try to move my neck- it just hurts.    Hand dominance: Right  PERTINENT HISTORY:  Anoxic brain injury in 1999 due to prolonged cardiac arrest,             With residual mild cognitive impairment             Slow worsening over the years             Continue Aricept 10 mg daily, Namenda 10 mg twice a day                 Seizure             Most recent in June 2019, EEG was normal             Continue lamotrigine 100 mg twice a day   Gait abnormality, worsening neck pain, limited range of motion, abnormal neck posturing, mild anterocollis, right tilt, significant tenderness along cervical paraspinal muscles, worse on the right side              CT cervical spine in June 2023, multilevel degenerative changes, severe right-sided C1-C2 osteoarthritis, variable degree of foraminal narrowing, no significant canal stenosis             He failed epidural injection, complains of increased confusion dizziness, unsteadiness with tizanidine, tramadol treatment,             Responded well to low-dose Botox injection,             Refer to physical therapy                EMG guided Botox injection, used 100 units today (100 units dissolving to 2 cc of normal saline)   Right levator scapular 25 units Right  longissimus capitis 12.5 units Right semispinalis 12.5 units Right splenius capitis 12.5 units Right splenius cervix 12.5 units Right iliocostalis 12.5 units Right upper trapezius 12.5 units   He tolerated injection well, will return to clinic in 3 months for repeat injection    PAIN:  Are you having pain? Yes: NPRS scale: 4/10 neck pain R> L and upper trap and having some low back pain as well.   Pain location: posterior neck  Pain description: ache yet sharp Aggravating factors: active head motion, lifting,  Relieving  factors: rest, Injections  PRECAUTIONS: Fall  RED FLAGS: None     WEIGHT BEARING RESTRICTIONS: No  FALLS:  Has patient fallen in last 6 months? Yes. Number of falls 3-4  LIVING ENVIRONMENT: Lives with: lives with their spouse Lives in: House/apartment Stairs: No Has following equipment at home: None  OCCUPATION: not working  PLOF: Independent  PATIENT GOALS: Move my neck better for safety and improve my pain  NEXT MD VISIT: unsure  OBJECTIVE:  Note: Objective measures were completed at Evaluation unless otherwise noted.  DIAGNOSTIC FINDINGS:  CLINICAL DATA:  Chronic, progressively worsening neck pain.   EXAM: CT CERVICAL SPINE WITHOUT CONTRAST   TECHNIQUE: Multidetector CT imaging of the cervical spine was performed without intravenous contrast. Multiplanar CT image reconstructions were also generated.   RADIATION DOSE REDUCTION: This exam was performed according to the departmental dose-optimization program which includes automated exposure control, adjustment of the mA and/or kV according to patient size and/or use of iterative reconstruction technique.   COMPARISON:  Cervical spine x-rays dated July 16, 2021.   FINDINGS: Alignment: Straightening of the normal cervical lordosis. Trace anterolisthesis at C3-C4 and C7-T1.   Skull base and vertebrae: No acute fracture. Severe right-sided C1-C2 osteoarthritis. No primary bone lesion or focal pathologic process. C4 vertebral bone island extending into the left posterior elements.   Soft tissues and spinal canal: No prevertebral fluid or swelling. No visible canal hematoma.   Disc levels:   C2-C3: Small central disc protrusion. Moderate right facet arthropathy. No stenosis.   C3-C4: Small central disc protrusion. Mild bilateral uncovertebral hypertrophy. Moderate bilateral facet arthropathy. No stenosis.   C4-C5: Small posterior disc osteophyte complex. Moderate right and mild left uncovertebral  hypertrophy. Moderate right facet arthropathy. Mild right neuroforaminal stenosis. No spinal canal or left neuroforaminal stenosis.   C5-C6: Small posterior disc osteophyte complex and moderate bilateral uncovertebral hypertrophy. No stenosis.   C6-C7: Small posterior disc osteophyte complex asymmetric to the right. Moderate left and mild right uncovertebral hypertrophy. Mild left neuroforaminal stenosis. No spinal canal or right neuroforaminal stenosis.   C7-T1: Negative disc. Mild bilateral facet uncovertebral hypertrophy. No stenosis.   Upper chest: Negative.   Other: None.   IMPRESSION: 1. Multilevel degenerative changes of the cervical spine as described above. No high-grade spinal canal or neuroforaminal stenosis. 2. Severe right-sided C1-C2 osteoarthritis.     Electronically Signed   By: Obie Dredge M.D.   On: 12/28/2021 13:55    PATIENT SURVEYS:  NDI 46%  COGNITION: Overall cognitive status: History of cognitive impairments - at baseline  SENSATION: WFL  POSTURE:  Very stiff c-spine  PALPATION: (+) right sided tenderness occiput down to UT region   CERVICAL ROM:   Active ROM A/PROM (deg) eval  Flexion 25  Extension 6  Right lateral flexion 8  Left lateral flexion 12  Right rotation 25  Left rotation 28   (Blank rows = not tested)  UPPER EXTREMITY ROM:  Active ROM Right  eval Left eval  Shoulder flexion    Shoulder extension    Shoulder abduction    Shoulder adduction    Shoulder extension    Shoulder internal rotation    Shoulder external rotation    Elbow flexion    Elbow extension    Wrist flexion    Wrist extension    Wrist ulnar deviation    Wrist radial deviation    Wrist pronation    Wrist supination     (Blank rows = not tested)  UPPER EXTREMITY MMT:  MMT Right eval Left eval  Shoulder flexion 2- 2-  Shoulder extension    Shoulder abduction 2- 2-  Shoulder adduction    Shoulder extension    Shoulder internal  rotation    Shoulder external rotation    Middle trapezius    Lower trapezius    Elbow flexion 4 4  Elbow extension 4 4  Wrist flexion 4 4  Wrist extension 4 4  Wrist ulnar deviation    Wrist radial deviation    Wrist pronation 4 4  Wrist supination 4 4  Grip strength     (Blank rows = not tested)  CERVICAL SPECIAL TESTS:  Pain with all active movement - unable to test  FUNCTIONAL TESTS:  none  TREATMENT DATE: 10/06/2023- PT EVAL ONLY                                                                                                                                 PATIENT EDUCATION:  Education details: purpose of PT; Anatomy of cervical spine and muscle associated.  Person educated: Patient and Spouse Education method: Explanation, Demonstration, Tactile cues, and Verbal cues Education comprehension: verbalized understanding and returned demonstration  HOME EXERCISE PROGRAM: To be initiated HEP  ASSESSMENT:  CLINICAL IMPRESSION: Patient is a 73 y.o. male who was seen today for physical therapy evaluation and treatment for cervical dystonia and neck pain. He presents today with pain limited mobility and increased tightness with minimal cervical mobility. He will benefit from skilled PT to assist with pain relief, ROM and improved functional mobility for optimal quality of life.    OBJECTIVE IMPAIRMENTS: Abnormal gait, decreased balance, decreased ROM, decreased strength, hypomobility, impaired flexibility, impaired UE functional use, and pain.   ACTIVITY LIMITATIONS: carrying, lifting, bending, and sleeping  PARTICIPATION LIMITATIONS: meal prep, cleaning, laundry, driving, shopping, community activity, and yard work  PERSONAL FACTORS: 1 comorbidity: anoxic brain injury  are also affecting patient's functional outcome.   REHAB POTENTIAL: Good  CLINICAL DECISION MAKING: Stable/uncomplicated  EVALUATION COMPLEXITY: Low   GOALS: Goals reviewed with patient? Yes  SHORT  TERM GOALS: Target date: 11/17/2023  Pt will be independent with HEP in order to improve strength and decrease back pain in order to improve pain-free function at home and work. Baseline: EVAL- No formal HEP in place Goal status: INITIAL  LONG TERM GOALS: Target date: 12/29/2023  Pt will demonstrate decrease  in NDI by at least 19% in order to demonstrate clinically significant reduction in disability related to neck injury/pain Baseline: EVAL= 46% Goal status: INITIAL  2.  Pt will decrease worst neck pain as reported on NPRS by at least 2 points in order to demonstrate clinically significant reduction in back pain Baseline: EVAL= 4/10 Goal status: INITIAL  3.  Patient will demonstrate improved cervical ROM by 15 deg each motion for improved head motion and ability to drive.  Baseline: EVAL- See cervical AROM section for values. Goal status: INITIAL   PLAN:  PT FREQUENCY: 1-2x/week  PT DURATION: 12 weeks  PLANNED INTERVENTIONS: 97164- PT Re-evaluation, 97110-Therapeutic exercises, 97530- Therapeutic activity, 97112- Neuromuscular re-education, 97535- Self Care, [id number]- Manual therapy, [phone]- Electrical stimulation (manual), Patient/Family education, Taping, Dry Needling, Joint mobilization, Joint manipulation, Spinal manipulation, Spinal mobilization, Cryotherapy, and Moist heat  PLAN FOR NEXT SESSION: Manual therapy for pain relief and ROM, Instruct in self stretching and add to HEP next visit.   Lenda Kelp, PT 10/07/2023, 11:46 AM

## 2023-10-10 NOTE — Therapy (Signed)
 OUTPATIENT PHYSICAL THERAPY CERVICAL TREATMENT   Patient Name: Larry [name] MRN: [id number] DOB:1950-08-11, 73 y.o., male Today's Date: 10/11/2023  END OF SESSION:  PT End of Session - 10/11/23 0803     Visit Number 2    Number of Visits 24    Date for PT Re-Evaluation 12/29/23    Progress Note Due on Visit 10    PT Start Time 0802    PT Stop Time 0843    PT Time Calculation (min) 41 min    Activity Tolerance Patient tolerated treatment well;No increased pain    Behavior During Therapy WFL for tasks assessed/performed              Past Medical History:  Diagnosis Date   ADD (attention deficit disorder)    Allergy    seasonal flares of perinneal allergies   Cancer (HCC)    melanoma   Cardiac arrest (HCC) 07/1997   aborted   Dementia (HCC)    post resusitative   Depression    Diverticulosis    Family hx of prostate cancer    GERD (gastroesophageal reflux disease)    Headache(784.0)    Hyperlipidemia    Hypothyroidism    ICD (implantable cardiac defibrillator) in place    Idiopathic urticaria    Internal hemorrhoids    Low back pain syndrome    Melanoma in situ of skin of trunk (HCC)    chest   PVC (premature ventricular contraction)    associated with cardiac arrest   Sleep apnea    no c-pap   Tubular adenoma of colon 10/2010   Past Surgical History:  Procedure Laterality Date    implantation x2     CARDIAC DEFIBRILLATOR PLACEMENT  07/01/2010   Explanation of a previously implanted device, pocket revision, and insertion of a new device, and intraoperative defibrillation threshold testing [name])   CARDIAC DEFIBRILLATOR REMOVAL  07/05/2002   Defibrillator change [name]) 3 times changed   CATARACT EXTRACTION W/PHACO Left 02/04/2021   Procedure: CATARACT EXTRACTION PHACO AND INTRAOCULAR LENS PLACEMENT (IOC) LEFT VIVITY toric LENS 9.21 01:11.6;  Surgeon: Lockie Mola, MD;  Location: [facility];  Service: Ophthalmology;  Laterality: Left;   keep patient at 11:30 arrival   CATARACT EXTRACTION W/PHACO Right 03/04/2021   Procedure: CATARACT EXTRACTION PHACO AND INTRAOCULAR LENS PLACEMENT (IOC) RIGHT VIVITY LENS 8.59 01:15.3;  Surgeon: Lockie Mola, MD;  Location: [facility];  Service: Ophthalmology;  Laterality: Right;   COLONOSCOPY     HEMORRHOID SURGERY     ICD     TONSILLECTOMY     Patient Active Problem List   Diagnosis Date Noted   Cervical dystonia 06/15/2022   Neck pain 07/02/2021   Gait abnormality 12/16/2020   Nonintractable headache 09/30/2020   Tinnitus 09/09/2019   History of sudden cardiac arrest 01/09/2019   Viral syndrome 09/06/2018   Seizures (HCC) 03/02/2018   Syncope 12/26/2017   Other social stressor 12/26/2017   Health care maintenance 08/14/2017   Family hx of prostate cancer    Allergy    Insomnia 10/20/2015   Advance care planning 03/08/2014   Memory loss 09/14/2013   Hypoxic brain injury (HCC) 09/14/2013   Medicare annual wellness visit, subsequent 02/16/2013   Shortness of breath 08/10/2011   Implantable cardioverter-defibrillator (ICD) in situ 02/03/2009   Hypothyroidism 11/18/2008   CONTACT DERMATITIS&OTHER ECZEMA DUE TO PLANTS 02/03/2008   IDIOPATHIC URTICARIA 02/03/2008   Hypertension 02/03/2008   HLD (hyperlipidemia) 10/27/2007   Attention deficit disorder 10/27/2007  PVC/VT non sustained Nov 14, 2007   Sleep apnea 14-Nov-2007   HEADACHE 2007-11-14   SUDDEN DEATH-aborted 2007/11/14   LOW BACK PAIN SYNDROME 10/11/2007    PCP: Dr. Crawford Givens  REFERRING PROVIDER: Dr. Levert Feinstein  REFERRING DIAG:  G24.3 (ICD-10-CM) - Cervical dystonia  M54.2 (ICD-10-CM) - Neck pain    THERAPY DIAG:   Rationale for Evaluation and Treatment: Rehabilitation  ONSET DATE: 2020  SUBJECTIVE:                                                                                                                                                                                                          SUBJECTIVE STATEMENT: From Today: Patient reports a fall over the weekend - coming down the steps- last 2 steps - lost his footing. He denies pain after fall and endorses ongoing Neck pain.  From EVAL:  Patient reports having long standing neck tightness/pain. Does receive botox injection with some relief yet very limited motion. If I try to move my neck- it just hurts.    Hand dominance: Right  PERTINENT HISTORY:  Anoxic brain injury in 1999 due to prolonged cardiac arrest,             With residual mild cognitive impairment             Slow worsening over the years             Continue Aricept 10 mg daily, Namenda 10 mg twice a day                 Seizure             Most recent in June 2019, EEG was normal             Continue lamotrigine 100 mg twice a day   Gait abnormality, worsening neck pain, limited range of motion, abnormal neck posturing, mild anterocollis, right tilt, significant tenderness along cervical paraspinal muscles, worse on the right side              CT cervical spine in June 2023, multilevel degenerative changes, severe right-sided C1-C2 osteoarthritis, variable degree of foraminal narrowing, no significant canal stenosis             He failed epidural injection, complains of increased confusion dizziness, unsteadiness with tizanidine, tramadol treatment,             Responded well to low-dose Botox injection,             Refer to physical therapy  EMG guided Botox injection, used 100 units today (100 units dissolving to 2 cc of normal saline)   Right levator scapular 25 units Right longissimus capitis 12.5 units Right semispinalis 12.5 units Right splenius capitis 12.5 units Right splenius cervix 12.5 units Right iliocostalis 12.5 units Right upper trapezius 12.5 units   He tolerated injection well, will return to clinic in 3 months for repeat injection    PAIN:  Are you having pain? Yes: NPRS scale: 4/10 neck pain R> L and upper  trap and having some low back pain as well.   Pain location: posterior neck  Pain description: ache yet sharp Aggravating factors: active head motion, lifting,  Relieving factors: rest, Injections  PRECAUTIONS: Fall  RED FLAGS: None     WEIGHT BEARING RESTRICTIONS: No  FALLS:  Has patient fallen in last 6 months? Yes. Number of falls 3-4  LIVING ENVIRONMENT: Lives with: lives with their spouse Lives in: House/apartment Stairs: No Has following equipment at home: None  OCCUPATION: not working  PLOF: Independent  PATIENT GOALS: Move my neck better for safety and improve my pain  NEXT MD VISIT: unsure  OBJECTIVE:  Note: Objective measures were completed at Evaluation unless otherwise noted.  DIAGNOSTIC FINDINGS:  CLINICAL DATA:  Chronic, progressively worsening neck pain.   EXAM: CT CERVICAL SPINE WITHOUT CONTRAST   TECHNIQUE: Multidetector CT imaging of the cervical spine was performed without intravenous contrast. Multiplanar CT image reconstructions were also generated.   RADIATION DOSE REDUCTION: This exam was performed according to the departmental dose-optimization program which includes automated exposure control, adjustment of the mA and/or kV according to patient size and/or use of iterative reconstruction technique.   COMPARISON:  Cervical spine x-rays dated July 16, 2021.   FINDINGS: Alignment: Straightening of the normal cervical lordosis. Trace anterolisthesis at C3-C4 and C7-T1.   Skull base and vertebrae: No acute fracture. Severe right-sided C1-C2 osteoarthritis. No primary bone lesion or focal pathologic process. C4 vertebral bone island extending into the left posterior elements.   Soft tissues and spinal canal: No prevertebral fluid or swelling. No visible canal hematoma.   Disc levels:   C2-C3: Small central disc protrusion. Moderate right facet arthropathy. No stenosis.   C3-C4: Small central disc protrusion. Mild bilateral  uncovertebral hypertrophy. Moderate bilateral facet arthropathy. No stenosis.   C4-C5: Small posterior disc osteophyte complex. Moderate right and mild left uncovertebral hypertrophy. Moderate right facet arthropathy. Mild right neuroforaminal stenosis. No spinal canal or left neuroforaminal stenosis.   C5-C6: Small posterior disc osteophyte complex and moderate bilateral uncovertebral hypertrophy. No stenosis.   C6-C7: Small posterior disc osteophyte complex asymmetric to the right. Moderate left and mild right uncovertebral hypertrophy. Mild left neuroforaminal stenosis. No spinal canal or right neuroforaminal stenosis.   C7-T1: Negative disc. Mild bilateral facet uncovertebral hypertrophy. No stenosis.   Upper chest: Negative.   Other: None.   IMPRESSION: 1. Multilevel degenerative changes of the cervical spine as described above. No high-grade spinal canal or neuroforaminal stenosis. 2. Severe right-sided C1-C2 osteoarthritis.     Electronically Signed   By: Obie Dredge M.D.   On: 12/28/2021 13:55    PATIENT SURVEYS:  NDI 46%  COGNITION: Overall cognitive status: History of cognitive impairments - at baseline  SENSATION: WFL  POSTURE:  Very stiff c-spine  PALPATION: (+) right sided tenderness occiput down to UT region   CERVICAL ROM:   Active ROM A/PROM (deg) eval  Flexion 25  Extension 6  Right lateral flexion 8  Left lateral  flexion 12  Right rotation 25  Left rotation 28   (Blank rows = not tested)  UPPER EXTREMITY ROM:  Active ROM Right eval Left eval  Shoulder flexion    Shoulder extension    Shoulder abduction    Shoulder adduction    Shoulder extension    Shoulder internal rotation    Shoulder external rotation    Elbow flexion    Elbow extension    Wrist flexion    Wrist extension    Wrist ulnar deviation    Wrist radial deviation    Wrist pronation    Wrist supination     (Blank rows = not tested)  UPPER EXTREMITY  MMT:  MMT Right eval Left eval  Shoulder flexion 2- 2-  Shoulder extension    Shoulder abduction 2- 2-  Shoulder adduction    Shoulder extension    Shoulder internal rotation    Shoulder external rotation    Middle trapezius    Lower trapezius    Elbow flexion 4 4  Elbow extension 4 4  Wrist flexion 4 4  Wrist extension 4 4  Wrist ulnar deviation    Wrist radial deviation    Wrist pronation 4 4  Wrist supination 4 4  Grip strength     (Blank rows = not tested)  CERVICAL SPECIAL TESTS:  Pain with all active movement - unable to test  FUNCTIONAL TESTS:  none  TREATMENT DATE: 10/11/2023-  Manual Therapy: STM to B posterior occiput/cervical into B levator/UT trap region x 25 min  Suboccipital release- 60 sec hold x 3   Therex:  PROM to C-Spine- Rotation, SB, Flex/ext x several min. Active scap retraction (supine) hold 3 sec x 10  Active Chin tuck (supine) -Hold 5 sec (difficulty breathing- instructed to count out loud)  Supine Deep neck flexor- (chin tuck + lift head off bed) x 10 rep (VC for technique)  AROM- Cervical Rotation x 10 reps (slow and controlled)   Self care/Home management- Instructed in below and reviewed videos and how to access via Iphone with patient/wife.                                                                                      Access Code: [email or web address] URL: [email or web address] Date: 10/11/2023 Prepared by: Maureen Ralphs  Exercises - Supine Chin Tuck  - 1 x daily - 3 sets - 10 reps - Supine Deep Neck Flexor Training - Repetitions  - 1 x daily - 3 sets - 10 reps - Seated Cervical Retraction  - 1 x daily - 3 sets - 10 reps   PATIENT EDUCATION:  Education details: purpose of PT; Anatomy of cervical spine and muscle associated.  Person educated: Patient and Spouse Education method: Explanation, Demonstration, Tactile cues, and Verbal cues Education comprehension: verbalized understanding and returned  demonstration  HOME EXERCISE PROGRAM: Access Code: [email or web address] URL: [email or web address] Date: 10/11/2023 Prepared by: Maureen Ralphs  Exercises - Supine Chin Tuck  - 1 x daily - 3 sets - 10 reps - Supine Deep Neck Flexor Training - Repetitions  - 1 x daily - 3 sets - 10 reps - Seated Cervical Retraction  -  1 x daily - 3 sets - 10 reps  ASSESSMENT:  CLINICAL IMPRESSION: Patient is a 73 y.o. male who was seen today for physical therapy evaluation and treatment for cervical dystonia and neck pain. He responded very well to [facility] for loosening some tight musculature and reported feeling much better after session. Initiated some beginner cervical exercises today and instructed both patient and wife. He will benefit from skilled PT to assist with pain relief, ROM and improved functional mobility for optimal quality of life.    OBJECTIVE IMPAIRMENTS: Abnormal gait, decreased balance, decreased ROM, decreased strength, hypomobility, impaired flexibility, impaired UE functional use, and pain.   ACTIVITY LIMITATIONS: carrying, lifting, bending, and sleeping  PARTICIPATION LIMITATIONS: meal prep, cleaning, laundry, driving, shopping, community activity, and yard work  PERSONAL FACTORS: 1 comorbidity: anoxic brain injury  are also affecting patient's functional outcome.   REHAB POTENTIAL: Good  CLINICAL DECISION MAKING: Stable/uncomplicated  EVALUATION COMPLEXITY: Low   GOALS: Goals reviewed with patient? Yes  SHORT TERM GOALS: Target date: 11/17/2023  Pt will be independent with HEP in order to improve strength and decrease back pain in order to improve pain-free function at home and work. Baseline: EVAL- No formal HEP in place Goal status: INITIAL  LONG TERM GOALS: Target date: 12/29/2023  Pt will demonstrate decrease in NDI by at least 19% in order to demonstrate clinically significant reduction in disability related to neck injury/pain Baseline: EVAL= 46% Goal  status: INITIAL  2.  Pt will decrease worst neck pain as reported on NPRS by at least 2 points in order to demonstrate clinically significant reduction in back pain Baseline: EVAL= 4/10 Goal status: INITIAL  3.  Patient will demonstrate improved cervical ROM by 15 deg each motion for improved head motion and ability to drive.  Baseline: EVAL- See cervical AROM section for values. Goal status: INITIAL   PLAN:  PT FREQUENCY: 1-2x/week  PT DURATION: 12 weeks  PLANNED INTERVENTIONS: 97164- PT Re-evaluation, 97110-Therapeutic exercises, 97530- Therapeutic activity, 97112- Neuromuscular re-education, 97535- Self Care, [address]- Manual therapy, [id number]- Electrical stimulation (manual), Patient/Family education, Taping, Dry Needling, Joint mobilization, Joint manipulation, Spinal manipulation, Spinal mobilization, Cryotherapy, and Moist heat  PLAN FOR NEXT SESSION: Manual therapy for pain relief and ROM, Instruct in self stretching and add to HEP next visit.   Lenda Kelp, PT 10/11/2023, 9:53 AM

## 2023-10-11 ENCOUNTER — Other Ambulatory Visit: Payer: Self-pay | Admitting: Family Medicine

## 2023-10-11 ENCOUNTER — Ambulatory Visit (INDEPENDENT_AMBULATORY_CARE_PROVIDER_SITE_OTHER): Payer: Medicare Other

## 2023-10-11 ENCOUNTER — Ambulatory Visit

## 2023-10-11 VITALS — Ht 69.0 in | Wt 172.0 lb

## 2023-10-11 DIAGNOSIS — Z0000 Encounter for general adult medical examination without abnormal findings: Secondary | ICD-10-CM

## 2023-10-11 DIAGNOSIS — G931 Anoxic brain damage, not elsewhere classified: Secondary | ICD-10-CM | POA: Diagnosis not present

## 2023-10-11 DIAGNOSIS — M542 Cervicalgia: Secondary | ICD-10-CM

## 2023-10-11 DIAGNOSIS — G243 Spasmodic torticollis: Secondary | ICD-10-CM | POA: Diagnosis not present

## 2023-10-11 DIAGNOSIS — E7849 Other hyperlipidemia: Secondary | ICD-10-CM

## 2023-10-11 DIAGNOSIS — I10 Essential (primary) hypertension: Secondary | ICD-10-CM

## 2023-10-11 DIAGNOSIS — M5382 Other specified dorsopathies, cervical region: Secondary | ICD-10-CM

## 2023-10-11 DIAGNOSIS — M6281 Muscle weakness (generalized): Secondary | ICD-10-CM

## 2023-10-11 NOTE — Patient Instructions (Signed)
 Mr. Mctavish , Thank you for taking time to come for your Medicare Wellness Visit. I appreciate your ongoing commitment to your health goals. Please review the following plan we discussed and let me know if I can assist you in the future.   Referrals/Orders/Follow-Ups/Clinician Recommendations: none  This is a list of the screening recommended for you and due dates:  Health Maintenance  Topic Date Due   DTaP/Tdap/Td vaccine (3 - Tdap) 11/21/2018   COVID-19 Vaccine (3 - 2024-25 season) 03/06/2023   Flu Shot  02/03/2024   Medicare Annual Wellness Visit  10/10/2024   Pneumonia Vaccine  Completed   Hepatitis C Screening  Completed   Zoster (Shingles) Vaccine  Completed   HPV Vaccine  Aged Out   Colon Cancer Screening  Discontinued    Advanced directives: (Copy Requested) Please bring a copy of your health care power of attorney and living will to the office to be added to your chart at your convenience. You can mail to [facility] [address] [place], [place] [address] or email to [email or web address]  Next Medicare Annual Wellness Visit scheduled for next year: Yes 10/12/23 @ 11:30am televisit

## 2023-10-11 NOTE — Progress Notes (Signed)
 Subjective:   Larry [name] is a 73 y.o. who presents for a Medicare Wellness preventive visit.  Visit Complete: Virtual I connected with  Larry [name] on 10/11/23 by a audio enabled telemedicine application and verified that I am speaking with the correct person using two identifiers.  Patient Location: Home  Provider Location: Office/Clinic  I discussed the limitations of evaluation and management by telemedicine. The patient expressed understanding and agreed to proceed.  Vital Signs: Because this visit was a virtual/telehealth visit, some criteria may be missing or patient reported. Any vitals not documented were not able to be obtained and vitals that have been documented are patient reported.  VideoError- Librarian, academic were attempted between this provider and patient, however failed, due to patient having technical difficulties OR patient did not have access to video capability.  We continued and completed visit with audio only.   Persons Participating in Visit: Patient assisted by wife, Larry [name]  AWV Questionnaire: No: Patient Medicare AWV questionnaire was not completed prior to this visit.  Cardiac Risk Factors include: advanced age (>58men, >80 women);dyslipidemia;hypertension;male gender;sedentary lifestyle     Objective:    Today's Vitals   10/11/23 1126 10/11/23 1128  Weight: 172 lb (78 kg)   Height: 5\' 9"  (1.753 m)   PainSc:  7    Body mass index is 25.4 kg/m.     10/11/2023   11:50 AM 10/07/2022   11:59 AM 10/05/2021    2:54 PM 03/04/2021    9:22 AM 02/04/2021   11:35 AM 01/27/2021    8:10 AM 10/03/2020    2:41 PM  Advanced Directives  Does Patient Have a Medical Advance Directive? Yes Yes Yes Yes Yes Yes No  Type of Estate agent of [place];Living will Healthcare Power of [place];Living will Healthcare Power of [place];Living will Healthcare Power of [place];Living will Healthcare Power of  [place];Living will Healthcare Power of [place];Living will   Does patient want to make changes to medical advance directive?    No - Patient declined No - Patient declined    Copy of Healthcare Power of Attorney in Chart? No - copy requested No - copy requested No - copy requested Yes - validated most recent copy scanned in chart (See row information) Yes - validated most recent copy scanned in chart (See row information) No - copy requested   Would patient like information on creating a medical advance directive?       Yes (MAU/Ambulatory/Procedural Areas - Information given)    Current Medications (verified) Outpatient Encounter Medications as of 10/11/2023  Medication Sig   amphetamine-dextroamphetamine (ADDERALL) 10 MG tablet TAKE ONE TABLET BY MOUTH TWICE A DAY   Ascorbic Acid (VITAMIN C) 500 MG tablet Take 500 mg by mouth daily.   aspirin 81 MG tablet Take 81 mg by mouth daily.   botulinum toxin Type A (BOTOX) 200 units injection Inject 200 units into the muscles every 3 months   Cholecalciferol (VITAMIN D3) 10 MCG (400 UNIT) tablet Take 400 Units by mouth daily.   diltiazem (CARDIZEM CD) 120 MG 24 hr capsule TAKE 1 CAPSULE BY MOUTH DAILY   donepezil (ARICEPT) 10 MG tablet Take 1 tablet (10 mg total) by mouth daily.   fexofenadine (ALLEGRA) 180 MG tablet Take 180 mg by mouth daily.   Ginkgo Biloba 40 MG TABS Take by mouth as directed.   lamoTRIgine (LAMICTAL) 100 MG tablet Take 1 tablet (100 mg total) by mouth 2 (two) times daily.  levothyroxine (SYNTHROID) 137 MCG tablet Take 1 tablet (137 mcg total) by mouth daily before breakfast.   Melatonin 5 MG CAPS Take by mouth as directed.   memantine (NAMENDA) 10 MG tablet Take 1 tablet (10 mg total) by mouth 2 (two) times daily.   metoprolol succinate (TOPROL-XL) 25 MG 24 hr tablet Take 1 tablet (25 mg total) by mouth daily. Will need visit for further refills.   montelukast (SINGULAIR) 10 MG tablet TAKE 1 TABLET BY MOUTH DAILY   Multiple  Vitamin (MULTIVITAMIN) tablet Take 700 tablets by mouth daily.   NON FORMULARY Focus factor 1 tab po bid   rosuvastatin (CRESTOR) 20 MG tablet Take 1 tablet (20 mg total) by mouth at bedtime.   tiZANidine (ZANAFLEX) 4 MG tablet Take 2-4 mg by mouth 2 (two) times daily as needed.   traMADol (ULTRAM) 50 MG tablet Take 50 mg by mouth 2 (two) times daily as needed.   vitamin B-12 (CYANOCOBALAMIN) 1000 MCG tablet Take 1,000 mcg by mouth daily.   vitamin E 180 MG (400 UNITS) capsule Take 400 Units by mouth daily.   No facility-administered encounter medications on file as of 10/11/2023.    Allergies (verified) Penicillins   History: Past Medical History:  Diagnosis Date   ADD (attention deficit disorder)    Allergy    seasonal flares of perinneal allergies   Cancer (HCC)    melanoma   Cardiac arrest (HCC) 07/1997   aborted   Dementia (HCC)    post resusitative   Depression    Diverticulosis    Family hx of prostate cancer    GERD (gastroesophageal reflux disease)    Headache(784.0)    Hyperlipidemia    Hypothyroidism    ICD (implantable cardiac defibrillator) in place    Idiopathic urticaria    Internal hemorrhoids    Low back pain syndrome    Melanoma in situ of skin of trunk (HCC)    chest   PVC (premature ventricular contraction)    associated with cardiac arrest   Sleep apnea    no c-pap   Tubular adenoma of colon 10/2010   Past Surgical History:  Procedure Laterality Date    implantation x2     CARDIAC DEFIBRILLATOR PLACEMENT  07/01/2010   Explanation of a previously implanted device, pocket revision, and insertion of a new device, and intraoperative defibrillation threshold testing [name])   CARDIAC DEFIBRILLATOR REMOVAL  07/05/2002   Defibrillator change [name]) 3 times changed   CATARACT EXTRACTION W/PHACO Left 02/04/2021   Procedure: CATARACT EXTRACTION PHACO AND INTRAOCULAR LENS PLACEMENT (IOC) LEFT VIVITY toric LENS 9.21 01:11.6;  Surgeon: Lockie Mola, MD;  Location: [facility];  Service: Ophthalmology;  Laterality: Left;  keep patient at 11:30 arrival   CATARACT EXTRACTION W/PHACO Right 03/04/2021   Procedure: CATARACT EXTRACTION PHACO AND INTRAOCULAR LENS PLACEMENT (IOC) RIGHT VIVITY LENS 8.59 01:15.3;  Surgeon: Lockie Mola, MD;  Location[facility];  Service: Ophthalmology;  Laterality: Right;   COLONOSCOPY     HEMORRHOID SURGERY     ICD     TONSILLECTOMY     Family History  Problem Relation Age of Onset   Heart disease Mother    Hypertension Mother    Heart disease Father    Diverticulosis Father    Coronary artery disease Father    Alopecia Father    Prostate cancer Brother    Diabetes Brother    Heart disease Brother        heart transplant  Lung cancer Paternal Grandfather        Smoker   Cancer Paternal Grandfather        lung cancer - smoker   Colon cancer Neg Hx    Esophageal cancer Neg Hx    Pancreatic cancer Neg Hx    Stomach cancer Neg Hx    Social History   Socioeconomic History   Marital status: Married    Spouse name: Larry [name]   Number of children: 2   Years of education: College   Highest education level: Not on file  Occupational History    Employer: DISABLED  Tobacco Use   Smoking status: Former    Current packs/day: 0.00    Average packs/day: 1 pack/day for 10.0 years (10.0 ttl pk-yrs)    Types: Cigarettes    Start date: 07/05/1985    Quit date: 07/06/1995    Years since quitting: 28.2   Smokeless tobacco: Former    Types: Chew    Quit date: 11/04/1994  Vaping Use   Vaping status: Never Used  Substance and Sexual Activity   Alcohol use: No    Alcohol/week: 0.0 standard drinks of alcohol    Comment: socially   Drug use: No   Sexual activity: Not on file  Other Topics Concern   Not on file  Social History Narrative   Patient is married Larry [name]) [phone] with 2 daughters   5 grandchildren   Daily caffeine use - one cup of coffee and one-two cokes per day    Patient is right-handed.   Patient has a college education   Social Drivers of Corporate investment banker Strain: Low Risk  (10/11/2023)   Overall Financial Resource Strain (CARDIA)    Difficulty of Paying Living Expenses: Not hard at all  Food Insecurity: No Food Insecurity (10/11/2023)   Hunger Vital Sign    Worried About Running Out of Food in the Last Year: Never true    Ran Out of Food in the Last Year: Never true  Transportation Needs: No Transportation Needs (10/11/2023)   PRAPARE - Administrator, Civil Service (Medical): No    Lack of Transportation (Non-Medical): No  Physical Activity: Inactive (10/11/2023)   Exercise Vital Sign    Days of Exercise per Week: 0 days    Minutes of Exercise per Session: 0 min  Stress: No Stress Concern Present (10/11/2023)   [employer] of Occupational Health - Occupational Stress Questionnaire    Feeling of Stress : Not at all  Social Connections: Socially Integrated (10/11/2023)   Social Connection and Isolation Panel [NHANES]    Frequency of Communication with Friends and Family: More than three times a week    Frequency of Social Gatherings with Friends and Family: More than three times a week    Attends Religious Services: More than 4 times per year    Active Member of [employer] or Organizations: Yes    Attends Engineer, structural: More than 4 times per year    Marital Status: Married    Tobacco Counseling Counseling given: Not Answered  Clinical Intake:  Pre-visit preparation completed: Yes  Pain : 0-10 Pain Score: 7  Pain Type: Chronic pain Pain Location: Back Pain Orientation: Lower Pain Descriptors / Indicators: Aching Pain Onset: More than a month ago Pain Frequency: Intermittent Pain Relieving Factors: muscle relaxer and tramadol given;does not take often;pt will schedule f/u with [facility]  Pain Relieving Factors: muscle relaxer and tramadol given;does not take  often;pt will  schedule f/u with [facility]  BMI - recorded: 25.4 Nutritional Status: BMI 25 -29 Overweight Nutritional Risks: None Diabetes: No  Lab Results  Component Value Date   HGBA1C 6.1 11/02/2022   HGBA1C 6.2 10/27/2021   HGBA1C 6.0 10/03/2020     How often do you need to have someone help you when you read instructions, pamphlets, or other written materials from your doctor or pharmacy?: 1 - Never  Interpreter Needed?: No  Comments: lives with wife Information entered by :: B.Keitha Kolk,LPN   Activities of Daily Living     10/11/2023   11:50 AM  In your present state of health, do you have any difficulty performing the following activities:  Hearing? 1  Vision? 1  Difficulty concentrating or making decisions? 1  Walking or climbing stairs? 1  Dressing or bathing? 0  Doing errands, shopping? 0  Preparing Food and eating ? N  Using the Toilet? N  In the past six months, have you accidently leaked urine? N  Do you have problems with loss of bowel control? N  Managing your Medications? N  Managing your Finances? N  Housekeeping or managing your Housekeeping? N    Patient Care Team: Joaquim Nam, MD as PCP - General (Family Medicine) Duke Salvia, MD as PCP - Electrophysiology (Cardiology) Tonny Bollman, MD as PCP - Cardiology (Cardiology) Lockie Mola, MD as Referring Physician (Ophthalmology)  Indicate any recent Medical Services you may have received from other than [facility] providers in the past year (date may be approximate).     Assessment:   This is a routine wellness examination for Randall.  Hearing/Vision screen Hearing Screening - Comments:: Pt says left ear w/constant ocean sounds; rt ear ok Vision Screening - Comments:: Pt says his vision is great other floaters in left eye (has had cataract surgery) Dr Inez Pilgrim   Goals Addressed             This Visit's Progress    COMPLETED: Patient Stated   On track     08/29/2019, I will start exercising more daily for about 30 minutes.     Patient Stated   On track    10/11/23- I will maintain and continue medications as prescribed.      Patient Stated   Not on track    10/11/23-Exercise more.       Depression Screen     10/11/2023   11:45 AM 11/09/2022   12:06 PM 10/07/2022   11:59 AM 10/05/2021    2:50 PM 10/03/2020    2:49 PM 08/29/2019    1:58 PM 08/14/2018   10:43 AM  PHQ 2/9 Scores  PHQ - 2 Score 0 0 0 0 0 0 0  PHQ- 9 Score  2   0 0     Fall Risk     10/11/2023   11:42 AM 11/09/2022   12:05 PM 10/07/2022   11:53 AM 10/05/2021    2:54 PM 10/03/2020    2:48 PM  Fall Risk   Falls in the past year? 1 0 0 0 0  Number falls in past yr: 1 0 0 0 0  Injury with Fall? 0 0 0 0 0  Risk for fall due to : Impaired balance/gait;Impaired vision No Fall Risks No Fall Risks Impaired balance/gait Medication side effect  Follow up Education provided;Falls prevention discussed Falls evaluation completed Falls prevention discussed;Falls evaluation completed Falls prevention discussed Falls evaluation completed;Falls prevention discussed  MEDICARE RISK AT HOME:  Medicare Risk at Home Any stairs in or around the home?: No If so, are there any without handrails?: No Home free of loose throw rugs in walkways, pet beds, electrical cords, etc?: Yes Adequate lighting in your home to reduce risk of falls?: Yes Life alert?: No Use of a cane, walker or w/c?: No Grab bars in the bathroom?: No Shower chair or bench in shower?: No Elevated toilet seat or a handicapped toilet?: Yes  TIMED UP AND GO:  Was the test performed?  No  Cognitive Function: 6CIT completed    10/03/2020    2:54 PM 12/11/2019    9:58 AM 08/29/2019    2:00 PM 06/12/2019    9:33 AM 03/02/2018    8:55 AM  MMSE - Mini Mental State Exam  Not completed: Refused      Orientation to time  5 5 5 4   Orientation to Place  5 5 5 5   Registration  3 3 3 3   Attention/ Calculation  5 5 1 5   Recall  1 3 3 1    Language- name 2 objects  2  2 2   Language- repeat  1 1 1 1   Language- follow 3 step command  3  3 3   Language- read & follow direction  1  1 1   Write a sentence  1  1 1   Copy design  1  1 1   Copy design-comments  12 animals  named 15 animals   Total score  28  26 27       06/15/2022    1:06 PM 12/21/2021   12:56 PM 07/02/2021   10:30 AM  Montreal Cognitive Assessment   Visuospatial/ Executive (0/5) 4 4 3   Naming (0/3) 3 3 3   Attention: Read list of digits (0/2) 2 2 2   Attention: Read list of letters (0/1) 1 1 1   Attention: Serial 7 subtraction starting at 100 (0/3) 3 3 2   Language: Repeat phrase (0/2) 1 1 1   Language : Fluency (0/1) 1 1 1   Abstraction (0/2) 2 2 2   Delayed Recall (0/5) 1 3 2   Orientation (0/6) 5 5 5   Total 23 25 22   Adjusted Score (based on education) 23        10/11/2023   11:51 AM 10/07/2022   12:00 PM  6CIT Screen  What Year? 0 points 0 points  What month? 0 points 0 points  What time? 0 points 0 points  Count back from 20 0 points 0 points  Months in reverse 2 points 0 points  Repeat phrase 10 points 6 points  Total Score 12 points 6 points    Immunizations Immunization History  Administered Date(s) Administered   Fluad Quad(high Dose 65+) 04/10/2019, 04/12/2020, 06/23/2021   Influenza Whole 05/01/2013   Influenza,inj,Quad PF,6+ Mos 05/27/2015, 06/01/2016, 04/19/2017, 04/20/2018   PFIZER(Purple Top)SARS-COV-2 Vaccination 07/31/2019, 08/21/2019   Pneumococcal Conjugate-13 08/03/2017   Pneumococcal Polysaccharide-23 08/14/2018   Td 01/02/1997, 11/20/2008   Zoster Recombinant(Shingrix) 09/19/2021, 01/30/2022    Screening Tests Health Maintenance  Topic Date Due   DTaP/Tdap/Td (3 - Tdap) 11/21/2018   COVID-19 Vaccine (3 - 2024-25 season) 03/06/2023   INFLUENZA VACCINE  02/03/2024   Medicare Annual Wellness (AWV)  10/10/2024   Pneumonia Vaccine 12+ Years old  Completed   Hepatitis C Screening  Completed   Zoster Vaccines- Shingrix  Completed    HPV VACCINES  Aged Out   Colonoscopy  Discontinued    Health Maintenance  Health Maintenance  Due  Topic Date Due   DTaP/Tdap/Td (3 - Tdap) 11/21/2018   COVID-19 Vaccine (3 - 2024-25 season) 03/06/2023   Health Maintenance Items Addressed: None needed  Additional Screening:  Vision Screening: Recommended annual ophthalmology exams for early detection of glaucoma and other disorders of the eye.  Dental Screening: Recommended annual dental exams for proper oral hygiene  Community Resource Referral / Chronic Care Management: CRR required this visit?  No   CCM required this visit?  No    Plan:     I have personally reviewed and noted the following in the patient's chart:   Medical and social history Use of alcohol, tobacco or illicit drugs  Current medications and supplements including opioid prescriptions. Patient is not currently taking opioid prescriptions. Functional ability and status Nutritional status Physical activity Advanced directives List of other physicians Hospitalizations, surgeries, and ER visits in previous 12 months Vitals Screenings to include cognitive, depression, and falls Referrals and appointments  In addition, I have reviewed and discussed with patient certain preventive protocols, quality metrics, and best practice recommendations. A written personalized care plan for preventive services as well as general preventive health recommendations were provided to patient.    Sue Lush, LPN   09/10/8826   After Visit Summary: (MyChart) Due to this being a telephonic visit, the after visit summary with patients personalized plan was offered to patient via MyChart   Notes: Please refer to Routing Comments.  Pt/wife says he has fallen 4 times in the last 2 months(denies any injuries). Pt relays he is trying to manage back pain. He will make an appt with [facility] to refill medications as he relays the injections did not  work. Pt has no other concerns/questions.Thanks you!Steward Drone

## 2023-10-12 ENCOUNTER — Ambulatory Visit (INDEPENDENT_AMBULATORY_CARE_PROVIDER_SITE_OTHER): Payer: Medicare Other

## 2023-10-12 DIAGNOSIS — Z8674 Personal history of sudden cardiac arrest: Secondary | ICD-10-CM | POA: Diagnosis not present

## 2023-10-12 LAB — CUP PACEART REMOTE DEVICE CHECK
Battery Remaining Longevity: 12 mo
Battery Remaining Percentage: 12 %
Brady Statistic RV Percent Paced: 1 %
Date Time Interrogation Session: 20250409032100
HighPow Impedance: 69 Ohm
Implantable Lead Connection Status: 753985
Implantable Lead Implant Date: 19990204
Implantable Lead Location: 753860
Implantable Lead Model: 135
Implantable Lead Serial Number: 301213
Implantable Pulse Generator Implant Date: 20111228
Lead Channel Impedance Value: 983 Ohm
Lead Channel Pacing Threshold Amplitude: 1.4 V
Lead Channel Pacing Threshold Pulse Width: 1 ms
Lead Channel Setting Pacing Amplitude: 2.4 V
Lead Channel Setting Pacing Pulse Width: 1 ms
Lead Channel Setting Sensing Sensitivity: 0.4 mV
Pulse Gen Serial Number: 277857

## 2023-10-13 ENCOUNTER — Ambulatory Visit

## 2023-10-13 DIAGNOSIS — M5382 Other specified dorsopathies, cervical region: Secondary | ICD-10-CM | POA: Diagnosis not present

## 2023-10-13 DIAGNOSIS — G931 Anoxic brain damage, not elsewhere classified: Secondary | ICD-10-CM | POA: Diagnosis not present

## 2023-10-13 DIAGNOSIS — M6281 Muscle weakness (generalized): Secondary | ICD-10-CM | POA: Diagnosis not present

## 2023-10-13 DIAGNOSIS — G243 Spasmodic torticollis: Secondary | ICD-10-CM | POA: Diagnosis not present

## 2023-10-13 DIAGNOSIS — M542 Cervicalgia: Secondary | ICD-10-CM | POA: Diagnosis not present

## 2023-10-14 NOTE — Therapy (Signed)
 OUTPATIENT PHYSICAL THERAPY CERVICAL TREATMENT   Patient Name: Larry [name] MRN: [id number] DOB:[birth year], 73 y.o., male Today's Date: 10/14/2023  END OF SESSION:  PT End of Session - 10/13/23 1029     Visit Number 3    Number of Visits 24    Date for PT Re-Evaluation 12/29/23    Progress Note Due on Visit 10    PT Start Time 1027    PT Stop Time 1102    PT Time Calculation (min) 35 min    Activity Tolerance Patient tolerated treatment well;No increased pain    Behavior During Therapy WFL for tasks assessed/performed              Past Medical History:  Diagnosis Date   ADD (attention deficit disorder)    Allergy    seasonal flares of perinneal allergies   Cancer (HCC)    melanoma   Cardiac arrest (HCC) 07/1997   aborted   Dementia (HCC)    post resusitative   Depression    Diverticulosis    Family hx of prostate cancer    GERD (gastroesophageal reflux disease)    Headache(784.0)    Hyperlipidemia    Hypothyroidism    ICD (implantable cardiac defibrillator) in place    Idiopathic urticaria    Internal hemorrhoids    Low back pain syndrome    Melanoma in situ of skin of trunk (HCC)    chest   PVC (premature ventricular contraction)    associated with cardiac arrest   Sleep apnea    no c-pap   Tubular adenoma of colon 10/2010   Past Surgical History:  Procedure Laterality Date    implantation x2     CARDIAC DEFIBRILLATOR PLACEMENT  07/01/2010   Explanation of a previously implanted device, pocket revision, and insertion of a new device, and intraoperative defibrillation threshold testing [name])   CARDIAC DEFIBRILLATOR REMOVAL  07/05/2002   Defibrillator change [name]) 3 times changed   CATARACT EXTRACTION W/PHACO Left 02/04/2021   Procedure: CATARACT EXTRACTION PHACO AND INTRAOCULAR LENS PLACEMENT (IOC) LEFT VIVITY toric LENS 9.21 01:11.6;  Surgeon: Lockie Mola, MD;  Location: [facility];  Service: Ophthalmology;  Laterality: Left;   keep patient at 11:30 arrival   CATARACT EXTRACTION W/PHACO Right 03/04/2021   Procedure: CATARACT EXTRACTION PHACO AND INTRAOCULAR LENS PLACEMENT (IOC) RIGHT VIVITY LENS 8.59 01:15.3;  Surgeon: Lockie Mola, MD;  Location: [facility];  Service: Ophthalmology;  Laterality: Right;   COLONOSCOPY     HEMORRHOID SURGERY     ICD     TONSILLECTOMY     Patient Active Problem List   Diagnosis Date Noted   Cervical dystonia 06/15/2022   Neck pain 07/02/2021   Gait abnormality 12/16/2020   Nonintractable headache 09/30/2020   Tinnitus 09/09/2019   History of sudden cardiac arrest 01/09/2019   Viral syndrome 09/06/2018   Seizures (HCC) 03/02/2018   Syncope 12/26/2017   Other social stressor 12/26/2017   Health care maintenance 08/14/2017   Family hx of prostate cancer    Allergy    Insomnia 10/20/2015   Advance care planning 03/08/2014   Memory loss 09/14/2013   Hypoxic brain injury (HCC) 09/14/2013   Medicare annual wellness visit, subsequent 02/16/2013   Shortness of breath 08/10/2011   Implantable cardioverter-defibrillator (ICD) in situ 02/03/2009   Hypothyroidism 11/18/2008   CONTACT DERMATITIS&OTHER ECZEMA DUE TO PLANTS 02/03/2008   IDIOPATHIC URTICARIA 02/03/2008   Hypertension 02/03/2008   HLD (hyperlipidemia) 10/27/2007   Attention deficit disorder 10/27/2007  PVC/VT non sustained 11/23/2007   Sleep apnea 11/23/2007   HEADACHE 11/23/2007   SUDDEN DEATH-aborted 2007/11/23   LOW BACK PAIN SYNDROME 10/11/2007    PCP: Dr. Crawford Givens  REFERRING PROVIDER: Dr. Levert Feinstein  REFERRING DIAG:  G24.3 (ICD-10-CM) - Cervical dystonia  M54.2 (ICD-10-CM) - Neck pain    THERAPY DIAG:   Rationale for Evaluation and Treatment: Rehabilitation  ONSET DATE: 2020  SUBJECTIVE:                                                                                                                                                                                                          SUBJECTIVE STATEMENT: From Today: Patient reports a fall over the weekend - coming down the steps- last 2 steps - lost his footing. He denies pain after fall and endorses ongoing Neck pain.  From EVAL:  Patient reports having long standing neck tightness/pain. Does receive botox injection with some relief yet very limited motion. If I try to move my neck- it just hurts.    Hand dominance: Right  PERTINENT HISTORY:  Anoxic brain injury in 1999 due to prolonged cardiac arrest,             With residual mild cognitive impairment             Slow worsening over the years             Continue Aricept 10 mg daily, Namenda 10 mg twice a day                 Seizure             Most recent in June 2019, EEG was normal             Continue lamotrigine 100 mg twice a day   Gait abnormality, worsening neck pain, limited range of motion, abnormal neck posturing, mild anterocollis, right tilt, significant tenderness along cervical paraspinal muscles, worse on the right side              CT cervical spine in June 2023, multilevel degenerative changes, severe right-sided C1-C2 osteoarthritis, variable degree of foraminal narrowing, no significant canal stenosis             He failed epidural injection, complains of increased confusion dizziness, unsteadiness with tizanidine, tramadol treatment,             Responded well to low-dose Botox injection,             Refer to physical therapy  EMG guided Botox injection, used 100 units today (100 units dissolving to 2 cc of normal saline)   Right levator scapular 25 units Right longissimus capitis 12.5 units Right semispinalis 12.5 units Right splenius capitis 12.5 units Right splenius cervix 12.5 units Right iliocostalis 12.5 units Right upper trapezius 12.5 units   He tolerated injection well, will return to clinic in 3 months for repeat injection    PAIN:  Are you having pain? Yes: NPRS scale: 4/10 neck pain R> L and  upper trap and having some low back pain as well.   Pain location: posterior neck  Pain description: ache yet sharp Aggravating factors: active head motion, lifting,  Relieving factors: rest, Injections  PRECAUTIONS: Fall  RED FLAGS: None     WEIGHT BEARING RESTRICTIONS: No  FALLS:  Has patient fallen in last 6 months? Yes. Number of falls 3-4  LIVING ENVIRONMENT: Lives with: lives with their spouse Lives in: House/apartment Stairs: No Has following equipment at home: None  OCCUPATION: not working  PLOF: Independent  PATIENT GOALS: Move my neck better for safety and improve my pain  NEXT MD VISIT: unsure  OBJECTIVE:  Note: Objective measures were completed at Evaluation unless otherwise noted.  DIAGNOSTIC FINDINGS:  CLINICAL DATA:  Chronic, progressively worsening neck pain.   EXAM: CT CERVICAL SPINE WITHOUT CONTRAST   TECHNIQUE: Multidetector CT imaging of the cervical spine was performed without intravenous contrast. Multiplanar CT image reconstructions were also generated.   RADIATION DOSE REDUCTION: This exam was performed according to the departmental dose-optimization program which includes automated exposure control, adjustment of the mA and/or kV according to patient size and/or use of iterative reconstruction technique.   COMPARISON:  Cervical spine x-rays dated July 16, 2021.   FINDINGS: Alignment: Straightening of the normal cervical lordosis. Trace anterolisthesis at C3-C4 and C7-T1.   Skull base and vertebrae: No acute fracture. Severe right-sided C1-C2 osteoarthritis. No primary bone lesion or focal pathologic process. C4 vertebral bone island extending into the left posterior elements.   Soft tissues and spinal canal: No prevertebral fluid or swelling. No visible canal hematoma.   Disc levels:   C2-C3: Small central disc protrusion. Moderate right facet arthropathy. No stenosis.   C3-C4: Small central disc protrusion. Mild  bilateral uncovertebral hypertrophy. Moderate bilateral facet arthropathy. No stenosis.   C4-C5: Small posterior disc osteophyte complex. Moderate right and mild left uncovertebral hypertrophy. Moderate right facet arthropathy. Mild right neuroforaminal stenosis. No spinal canal or left neuroforaminal stenosis.   C5-C6: Small posterior disc osteophyte complex and moderate bilateral uncovertebral hypertrophy. No stenosis.   C6-C7: Small posterior disc osteophyte complex asymmetric to the right. Moderate left and mild right uncovertebral hypertrophy. Mild left neuroforaminal stenosis. No spinal canal or right neuroforaminal stenosis.   C7-T1: Negative disc. Mild bilateral facet uncovertebral hypertrophy. No stenosis.   Upper chest: Negative.   Other: None.   IMPRESSION: 1. Multilevel degenerative changes of the cervical spine as described above. No high-grade spinal canal or neuroforaminal stenosis. 2. Severe right-sided C1-C2 osteoarthritis.     Electronically Signed   By: Obie Dredge M.D.   On: 12/28/2021 13:55    PATIENT SURVEYS:  NDI 46%  COGNITION: Overall cognitive status: History of cognitive impairments - at baseline  SENSATION: WFL  POSTURE:  Very stiff c-spine  PALPATION: (+) right sided tenderness occiput down to UT region   CERVICAL ROM:   Active ROM A/PROM (deg) eval  Flexion 25  Extension 6  Right lateral flexion 8  Left lateral  flexion 12  Right rotation 25  Left rotation 28   (Blank rows = not tested)  UPPER EXTREMITY ROM:  Active ROM Right eval Left eval  Shoulder flexion    Shoulder extension    Shoulder abduction    Shoulder adduction    Shoulder extension    Shoulder internal rotation    Shoulder external rotation    Elbow flexion    Elbow extension    Wrist flexion    Wrist extension    Wrist ulnar deviation    Wrist radial deviation    Wrist pronation    Wrist supination     (Blank rows = not tested)  UPPER  EXTREMITY MMT:  MMT Right eval Left eval  Shoulder flexion 2- 2-  Shoulder extension    Shoulder abduction 2- 2-  Shoulder adduction    Shoulder extension    Shoulder internal rotation    Shoulder external rotation    Middle trapezius    Lower trapezius    Elbow flexion 4 4  Elbow extension 4 4  Wrist flexion 4 4  Wrist extension 4 4  Wrist ulnar deviation    Wrist radial deviation    Wrist pronation 4 4  Wrist supination 4 4  Grip strength     (Blank rows = not tested)  CERVICAL SPECIAL TESTS:  Pain with all active movement - unable to test  FUNCTIONAL TESTS:  none  TREATMENT DATE: 10/11/2023-  Manual Therapy: STM to B posterior occiput/cervical into B levator/UT trap region x 20 min  Suboccipital release- 60 sec hold x 3   Therex:  PROM to C-Spine- Rotation, SB, Flex/ext x several min. Active scap retraction (supine) hold 3 sec x 10  Active Chin tuck (supine) -Hold 5 sec (difficulty breathing- instructed to count out loud)  Supine scapular retraction 2 sets x 10 rep (VC for technique)  AROM- Cervical Rotation x 10 reps (slow and controlled)   Self care/Home management- Reviewed cervical stretching and                                                                                      Postural strengthening   PATIENT EDUCATION:  Education details: purpose of PT; Anatomy of cervical spine and muscle associated.  Person educated: Patient and Spouse Education method: Explanation, Demonstration, Tactile cues, and Verbal cues Education comprehension: verbalized understanding and returned demonstration  HOME EXERCISE PROGRAM: Access Code: [email or web address] URL: [email or web address] Date: 10/11/2023 Prepared by: Maureen Ralphs  Exercises - Supine Chin Tuck  - 1 x daily - 3 sets - 10 reps - Supine Deep Neck Flexor Training - Repetitions  - 1 x daily - 3 sets - 10 reps - Seated Cervical Retraction  - 1 x daily - 3 sets - 10  reps  ASSESSMENT:  CLINICAL IMPRESSION: Patient is a 73 y.o. male who was seen today for physical therapy evaluation and treatment for cervical dystonia and neck pain. He continues to respond very well to [facility] for relieving hypomobility and reported feeling much better after session. Reviewed some cervical stretching and deep neck flexor strengthening and patient required extensive assist and review for optimal comprehension.He will benefit  from skilled PT to assist with pain relief, ROM and improved functional mobility for optimal quality of life.    OBJECTIVE IMPAIRMENTS: Abnormal gait, decreased balance, decreased ROM, decreased strength, hypomobility, impaired flexibility, impaired UE functional use, and pain.   ACTIVITY LIMITATIONS: carrying, lifting, bending, and sleeping  PARTICIPATION LIMITATIONS: meal prep, cleaning, laundry, driving, shopping, community activity, and yard work  PERSONAL FACTORS: 1 comorbidity: anoxic brain injury  are also affecting patient's functional outcome.   REHAB POTENTIAL: Good  CLINICAL DECISION MAKING: Stable/uncomplicated  EVALUATION COMPLEXITY: Low   GOALS: Goals reviewed with patient? Yes  SHORT TERM GOALS: Target date: 11/17/2023  Pt will be independent with HEP in order to improve strength and decrease back pain in order to improve pain-free function at home and work. Baseline: EVAL- No formal HEP in place Goal status: INITIAL  LONG TERM GOALS: Target date: 12/29/2023  Pt will demonstrate decrease in NDI by at least 19% in order to demonstrate clinically significant reduction in disability related to neck injury/pain Baseline: EVAL= 46% Goal status: INITIAL  2.  Pt will decrease worst neck pain as reported on NPRS by at least 2 points in order to demonstrate clinically significant reduction in back pain Baseline: EVAL= 4/10 Goal status: INITIAL  3.  Patient will demonstrate improved cervical ROM by 15 deg each motion for improved head  motion and ability to drive.  Baseline: EVAL- See cervical AROM section for values. Goal status: INITIAL   PLAN:  PT FREQUENCY: 1-2x/week  PT DURATION: 12 weeks  PLANNED INTERVENTIONS: 97164- PT Re-evaluation, 97110-Therapeutic exercises, 97530- Therapeutic activity, 97112- Neuromuscular re-education, 97535- Self Care, [id number]- Manual therapy, [id number]- Electrical stimulation (manual), Patient/Family education, Taping, Dry Needling, Joint mobilization, Joint manipulation, Spinal manipulation, Spinal mobilization, Cryotherapy, and Moist heat  PLAN FOR NEXT SESSION: Manual therapy for pain relief and ROM, Instruct in self stretching and add to HEP next visit.   Lenda Kelp, PT 10/14/2023, 8:25 AM

## 2023-10-15 IMAGING — CR DG CERVICAL SPINE 2 OR 3 VIEWS
4 series · 4 of 4 positions shown · non-contrast
Comparison: None.

CLINICAL DATA: Right-sided neck pain radiating to the scapula for
the past several years. No known injury.

EXAM:
CERVICAL SPINE - 2-3 VIEW

[w cervical spine lat]
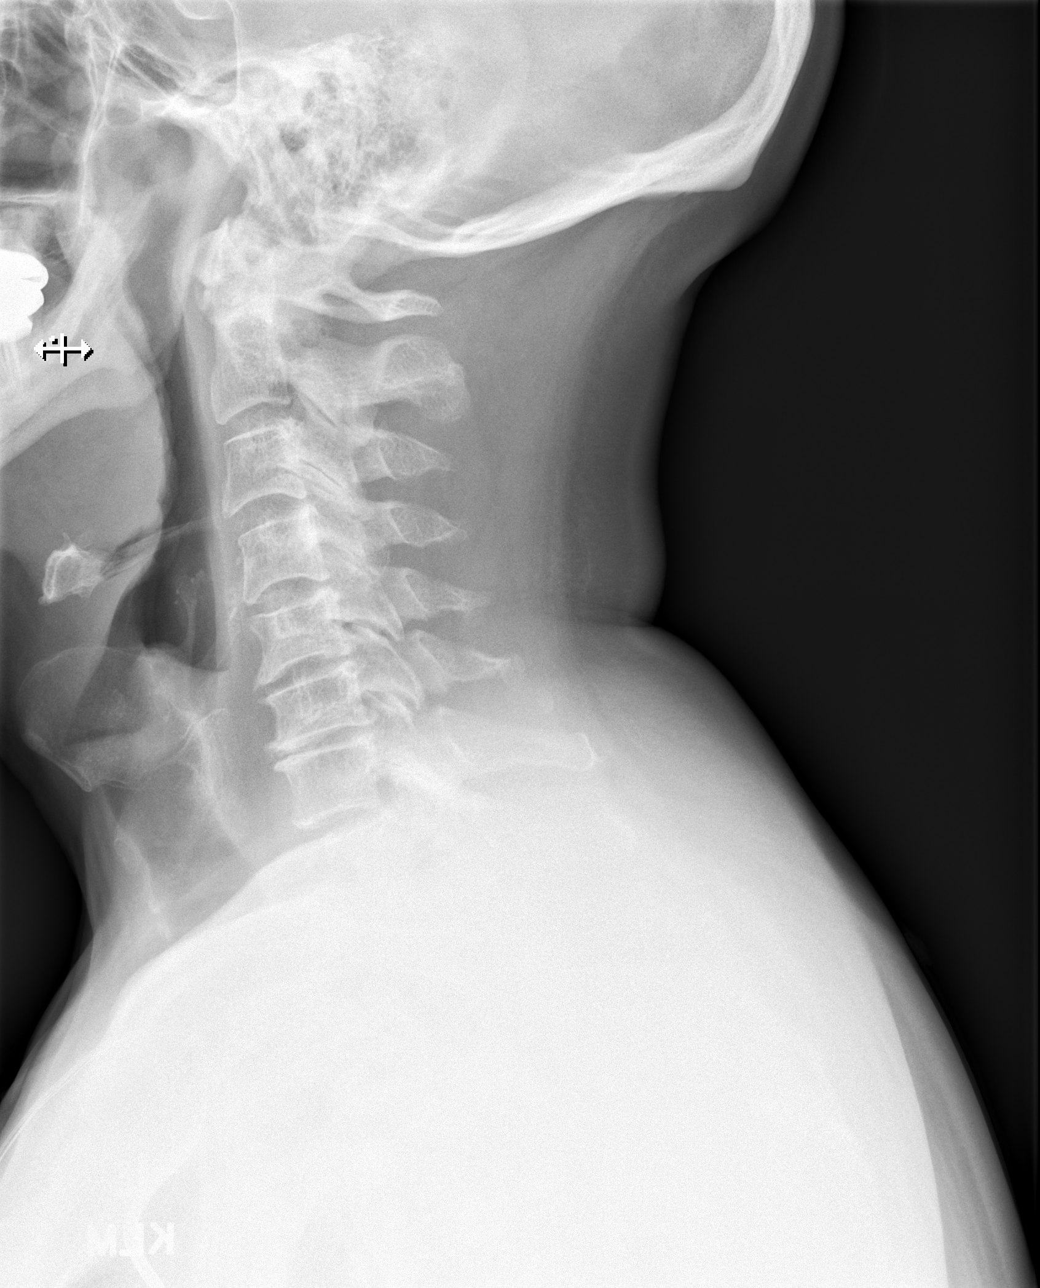

[w cervical spine ap]
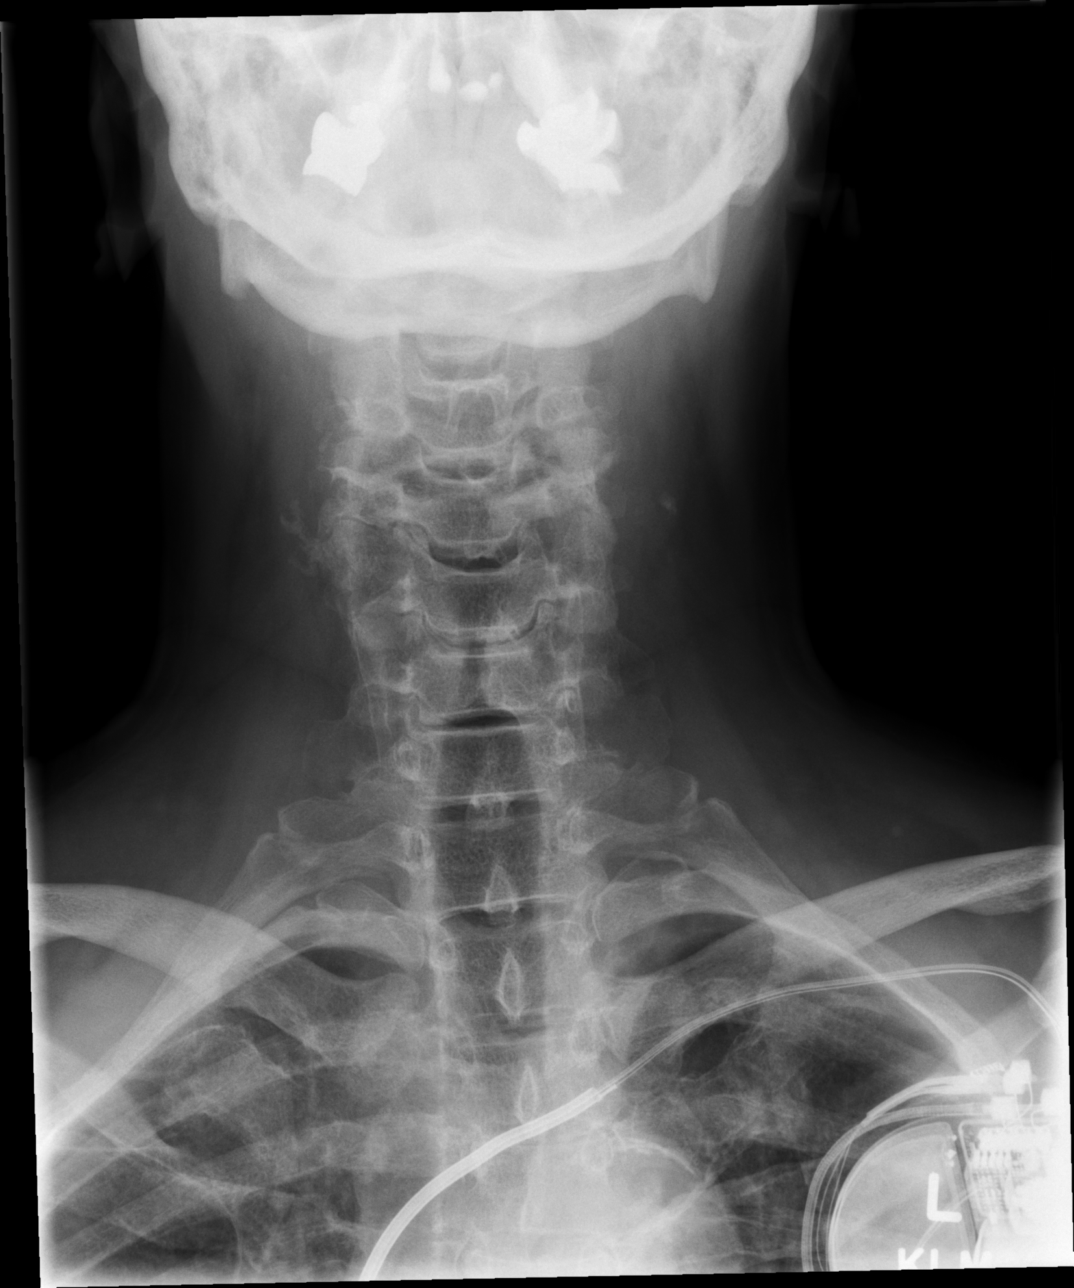

[w cervical spine odontoid]
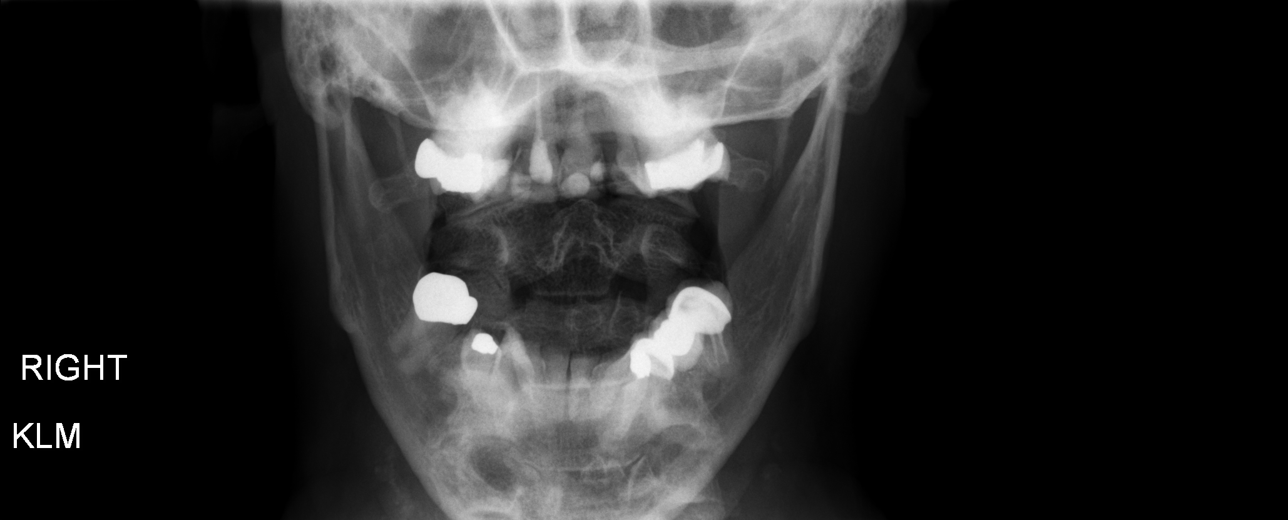

[w cervical swimmers]
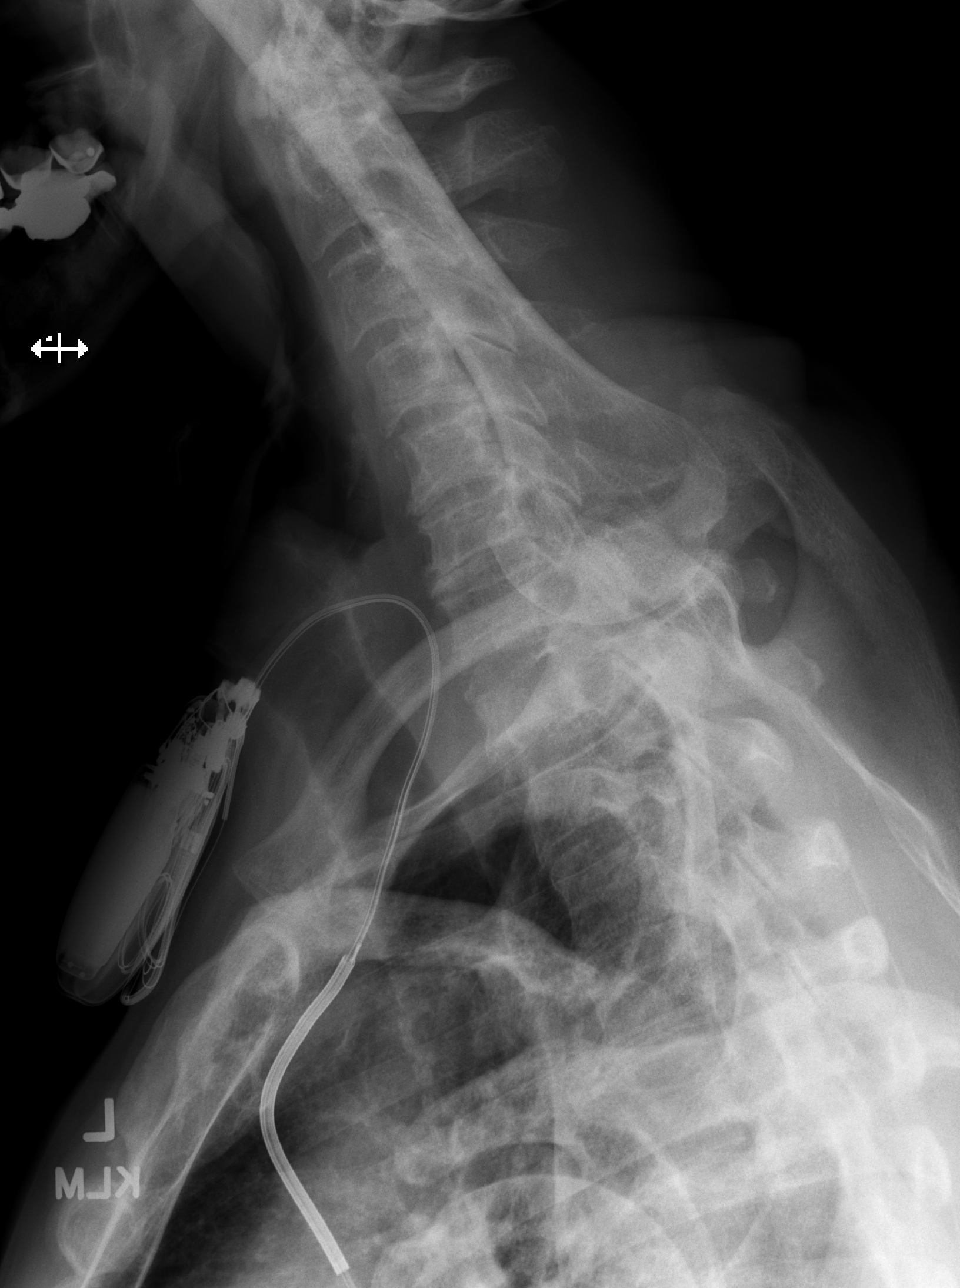

[4 of 4 positions shown; findings below may reference images not displayed]

FINDINGS: C1 to the superior endplate of T1 is imaged on the provided lateral
radiograph.

Straightening of the expected cervical lordosis. No anterolisthesis
or retrolisthesis.

The open mouth odontoid radiograph is degraded due to obliquity
however the dens appears normally positioned between the lateral
masses of C1.

Cervical vertebral body heights appear preserved. Prevertebral soft
tissues appear normal.

Mild to moderate multilevel cervical spine DDD, worse at C6-C7, and
to a lesser extent, C5-C6 with disc space height loss, endplate
irregularity and sclerosis.

Atherosclerotic plaque within the bilateral carotid bulbs.

Limited visualization of the lung apices demonstrates left anterior
chest wall AICD/pacemaker.
IMPRESSION: Mild-to-moderate multilevel cervical spine DDD, worse at C6-C7.

## 2023-10-17 ENCOUNTER — Ambulatory Visit

## 2023-10-17 DIAGNOSIS — M542 Cervicalgia: Secondary | ICD-10-CM | POA: Diagnosis not present

## 2023-10-17 DIAGNOSIS — M47816 Spondylosis without myelopathy or radiculopathy, lumbar region: Secondary | ICD-10-CM | POA: Diagnosis not present

## 2023-10-19 NOTE — Therapy (Signed)
 OUTPATIENT PHYSICAL THERAPY CERVICAL TREATMENT   Patient Name: Larry [name] MRN: [id number] DOB:25-Jun-1951, 73 y.o., male Today's Date: 10/20/2023  END OF SESSION:  PT End of Session - 10/20/23 0811     Visit Number 4    Number of Visits 24    Date for PT Re-Evaluation 12/29/23    Progress Note Due on Visit 10    PT Start Time 0805    PT Stop Time 0845    PT Time Calculation (min) 40 min    Activity Tolerance Patient tolerated treatment well;No increased pain    Behavior During Therapy WFL for tasks assessed/performed               Past Medical History:  Diagnosis Date   ADD (attention deficit disorder)    Allergy    seasonal flares of perinneal allergies   Cancer (HCC)    melanoma   Cardiac arrest (HCC) 07/1997   aborted   Dementia (HCC)    post resusitative   Depression    Diverticulosis    Family hx of prostate cancer    GERD (gastroesophageal reflux disease)    Headache(784.0)    Hyperlipidemia    Hypothyroidism    ICD (implantable cardiac defibrillator) in place    Idiopathic urticaria    Internal hemorrhoids    Low back pain syndrome    Melanoma in situ of skin of trunk (HCC)    chest   PVC (premature ventricular contraction)    associated with cardiac arrest   Sleep apnea    no c-pap   Tubular adenoma of colon 10/2010   Past Surgical History:  Procedure Laterality Date    implantation x2     CARDIAC DEFIBRILLATOR PLACEMENT  07/01/2010   Explanation of a previously implanted device, pocket revision, and insertion of a new device, and intraoperative defibrillation threshold testing [name])   CARDIAC DEFIBRILLATOR REMOVAL  07/05/2002   Defibrillator change [name]) 3 times changed   CATARACT EXTRACTION W/PHACO Left 02/04/2021   Procedure: CATARACT EXTRACTION PHACO AND INTRAOCULAR LENS PLACEMENT (IOC) LEFT VIVITY toric LENS 9.21 01:11.6;  Surgeon: Lockie Mola, MD;  Location: [facility];  Service: Ophthalmology;  Laterality:  Left;  keep patient at 11:30 arrival   CATARACT EXTRACTION W/PHACO Right 03/04/2021   Procedure: CATARACT EXTRACTION PHACO AND INTRAOCULAR LENS PLACEMENT (IOC) RIGHT VIVITY LENS 8.59 01:15.3;  Surgeon: Lockie Mola, MD;  Location: [facility];  Service: Ophthalmology;  Laterality: Right;   COLONOSCOPY     HEMORRHOID SURGERY     ICD     TONSILLECTOMY     Patient Active Problem List   Diagnosis Date Noted   Cervical dystonia 06/15/2022   Neck pain 07/02/2021   Gait abnormality 12/16/2020   Nonintractable headache 09/30/2020   Tinnitus 09/09/2019   History of sudden cardiac arrest 01/09/2019   Viral syndrome 09/06/2018   Seizures (HCC) 03/02/2018   Syncope 12/26/2017   Other social stressor 12/26/2017   Health care maintenance 08/14/2017   Family hx of prostate cancer    Allergy    Insomnia 10/20/2015   Advance care planning 03/08/2014   Memory loss 09/14/2013   Hypoxic brain injury (HCC) 09/14/2013   Medicare annual wellness visit, subsequent 02/16/2013   Shortness of breath 08/10/2011   Implantable cardioverter-defibrillator (ICD) in situ 02/03/2009   Hypothyroidism 11/18/2008   CONTACT DERMATITIS&OTHER ECZEMA DUE TO PLANTS 02/03/2008   IDIOPATHIC URTICARIA 02/03/2008   Hypertension 02/03/2008   HLD (hyperlipidemia) 10/27/2007   Attention deficit disorder  26-Nov-2007   PVC/VT non sustained 2007/11/26   Sleep apnea 11-26-2007   HEADACHE 11-26-07   SUDDEN DEATH-aborted 11-26-07   LOW BACK PAIN SYNDROME 10/11/2007    PCP: Dr. Crawford Givens  REFERRING PROVIDER: Dr. Levert Feinstein  REFERRING DIAG:  G24.3 (ICD-10-CM) - Cervical dystonia  M54.2 (ICD-10-CM) - Neck pain    THERAPY DIAG:   Rationale for Evaluation and Treatment: Rehabilitation  ONSET DATE: 2020  SUBJECTIVE:                                                                                                                                                                                                          SUBJECTIVE STATEMENT: From Today: Patient reports neck is still stiff but he and his wife reports compliant with HEP to date.   From EVAL:  Patient reports having long standing neck tightness/pain. Does receive botox injection with some relief yet very limited motion. If I try to move my neck- it just hurts.    Hand dominance: Right  PERTINENT HISTORY:  Anoxic brain injury in 1999 due to prolonged cardiac arrest,             With residual mild cognitive impairment             Slow worsening over the years             Continue Aricept 10 mg daily, Namenda 10 mg twice a day                 Seizure             Most recent in June 2019, EEG was normal             Continue lamotrigine 100 mg twice a day   Gait abnormality, worsening neck pain, limited range of motion, abnormal neck posturing, mild anterocollis, right tilt, significant tenderness along cervical paraspinal muscles, worse on the right side              CT cervical spine in June 2023, multilevel degenerative changes, severe right-sided C1-C2 osteoarthritis, variable degree of foraminal narrowing, no significant canal stenosis             He failed epidural injection, complains of increased confusion dizziness, unsteadiness with tizanidine, tramadol treatment,             Responded well to low-dose Botox injection,             Refer to physical therapy  EMG guided Botox injection, used 100 units today (100 units dissolving to 2 cc of normal saline)   Right levator scapular 25 units Right longissimus capitis 12.5 units Right semispinalis 12.5 units Right splenius capitis 12.5 units Right splenius cervix 12.5 units Right iliocostalis 12.5 units Right upper trapezius 12.5 units   He tolerated injection well, will return to clinic in 3 months for repeat injection    PAIN:  Are you having pain? Yes: NPRS scale: 4/10 neck pain R> L and upper trap and having some low back pain as well.   Pain  location: posterior neck  Pain description: ache yet sharp Aggravating factors: active head motion, lifting,  Relieving factors: rest, Injections  PRECAUTIONS: Fall  RED FLAGS: None     WEIGHT BEARING RESTRICTIONS: No  FALLS:  Has patient fallen in last 6 months? Yes. Number of falls 3-4  LIVING ENVIRONMENT: Lives with: lives with their spouse Lives in: House/apartment Stairs: No Has following equipment at home: None  OCCUPATION: not working  PLOF: Independent  PATIENT GOALS: Move my neck better for safety and improve my pain  NEXT MD VISIT: unsure  OBJECTIVE:  Note: Objective measures were completed at Evaluation unless otherwise noted.  DIAGNOSTIC FINDINGS:  CLINICAL DATA:  Chronic, progressively worsening neck pain.   EXAM: CT CERVICAL SPINE WITHOUT CONTRAST   TECHNIQUE: Multidetector CT imaging of the cervical spine was performed without intravenous contrast. Multiplanar CT image reconstructions were also generated.   RADIATION DOSE REDUCTION: This exam was performed according to the departmental dose-optimization program which includes automated exposure control, adjustment of the mA and/or kV according to patient size and/or use of iterative reconstruction technique.   COMPARISON:  Cervical spine x-rays dated July 16, 2021.   FINDINGS: Alignment: Straightening of the normal cervical lordosis. Trace anterolisthesis at C3-C4 and C7-T1.   Skull base and vertebrae: No acute fracture. Severe right-sided C1-C2 osteoarthritis. No primary bone lesion or focal pathologic process. C4 vertebral bone island extending into the left posterior elements.   Soft tissues and spinal canal: No prevertebral fluid or swelling. No visible canal hematoma.   Disc levels:   C2-C3: Small central disc protrusion. Moderate right facet arthropathy. No stenosis.   C3-C4: Small central disc protrusion. Mild bilateral uncovertebral hypertrophy. Moderate bilateral facet  arthropathy. No stenosis.   C4-C5: Small posterior disc osteophyte complex. Moderate right and mild left uncovertebral hypertrophy. Moderate right facet arthropathy. Mild right neuroforaminal stenosis. No spinal canal or left neuroforaminal stenosis.   C5-C6: Small posterior disc osteophyte complex and moderate bilateral uncovertebral hypertrophy. No stenosis.   C6-C7: Small posterior disc osteophyte complex asymmetric to the right. Moderate left and mild right uncovertebral hypertrophy. Mild left neuroforaminal stenosis. No spinal canal or right neuroforaminal stenosis.   C7-T1: Negative disc. Mild bilateral facet uncovertebral hypertrophy. No stenosis.   Upper chest: Negative.   Other: None.   IMPRESSION: 1. Multilevel degenerative changes of the cervical spine as described above. No high-grade spinal canal or neuroforaminal stenosis. 2. Severe right-sided C1-C2 osteoarthritis.     Electronically Signed   By: Aleta Anda M.D.   On: 12/28/2021 13:55    PATIENT SURVEYS:  NDI 46%  COGNITION: Overall cognitive status: History of cognitive impairments - at baseline  SENSATION: WFL  POSTURE:  Very stiff c-spine  PALPATION: (+) right sided tenderness occiput down to UT region   CERVICAL ROM:   Active ROM A/PROM (deg) eval  Flexion 25  Extension 6  Right lateral flexion 8  Left lateral  flexion 12  Right rotation 25  Left rotation 28   (Blank rows = not tested)  UPPER EXTREMITY ROM:  Active ROM Right eval Left eval  Shoulder flexion    Shoulder extension    Shoulder abduction    Shoulder adduction    Shoulder extension    Shoulder internal rotation    Shoulder external rotation    Elbow flexion    Elbow extension    Wrist flexion    Wrist extension    Wrist ulnar deviation    Wrist radial deviation    Wrist pronation    Wrist supination     (Blank rows = not tested)  UPPER EXTREMITY MMT:  MMT Right eval Left eval  Shoulder flexion 2-  2-  Shoulder extension    Shoulder abduction 2- 2-  Shoulder adduction    Shoulder extension    Shoulder internal rotation    Shoulder external rotation    Middle trapezius    Lower trapezius    Elbow flexion 4 4  Elbow extension 4 4  Wrist flexion 4 4  Wrist extension 4 4  Wrist ulnar deviation    Wrist radial deviation    Wrist pronation 4 4  Wrist supination 4 4  Grip strength     (Blank rows = not tested)  CERVICAL SPECIAL TESTS:  Pain with all active movement - unable to test  FUNCTIONAL TESTS:  none  TREATMENT DATE: 10/20/2023-   Manual Therapy:  Suboccipital release- 60 sec hold x 3  STM to B posterior occiput/cervical into B levator/UT trap region x 20 min     Therex:  PROM to C-Spine- Rotation, SB, Flex/ext x several min. Instruction in sitting resistive scap retraction-hold 3 sec 2x 10 (1st with wide elbows and 2nd with narrow) Active Chin tuck (supine) -Hold 5 sec (difficulty breathing- instructed to count out loud)   NMR:  Standing shoulder horizontal abd- RTB- 2x 10 reps   AROM- Cervical Rotation x 10 reps (slow and controlled)  Standing wall angels- x 10 reps  Standing wall posture- static hold x 60 sec x 3.   Self care/Home management- ADDED below to HEP today. Issued handout and reviewed with patient/wife    PATIENT EDUCATION:  Education details: Exercise technique; importance of postural training Person educated: Patient and Spouse Education method: Explanation, Demonstration, Tactile cues, and Verbal cues Education comprehension: verbalized understanding and returned demonstration  HOME EXERCISE PROGRAM: Access Code: [email or web address] URL: [email or web address] Date: 10/20/2023 Prepared by: Ferrell Hu  Exercises - Standing Shoulder Row with Anchored Resistance  - 3 x weekly - 3 sets - 10-12 reps - Standing Row with Resistance with Anchored Resistance at Chest Height Palms Down  - 3 x weekly - 3 sets - 10-12 reps - Wall  Angels  - 3 x weekly - 3 sets - 10 reps    Access Code: [email or web address] URL: [email or web address] Date: 10/11/2023 Prepared by: Ferrell Hu  Exercises - Supine Chin Tuck  - 1 x daily - 3 sets - 10 reps - Supine Deep Neck Flexor Training - Repetitions  - 1 x daily - 3 sets - 10 reps - Seated Cervical Retraction  - 1 x daily - 3 sets - 10 reps  ASSESSMENT:  CLINICAL IMPRESSION: Patient is a 73 y.o. male who was seen today for physical therapy treatment for cervical dystonia and neck pain. He continues to respond very well to [facility] for relieving hypomobility as he reported feeling loose and pain down. Yet as he  started some postural strengthening he reported some "popping in neck" which he states is very common. Instructed to keep shoulder relaxed as much as possible to avoid Upper trap compensation.  He will benefit from skilled PT to assist with pain relief, ROM and improved functional mobility for optimal quality of life.    OBJECTIVE IMPAIRMENTS: Abnormal gait, decreased balance, decreased ROM, decreased strength, hypomobility, impaired flexibility, impaired UE functional use, and pain.   ACTIVITY LIMITATIONS: carrying, lifting, bending, and sleeping  PARTICIPATION LIMITATIONS: meal prep, cleaning, laundry, driving, shopping, community activity, and yard work  PERSONAL FACTORS: 1 comorbidity: anoxic brain injury  are also affecting patient's functional outcome.   REHAB POTENTIAL: Good  CLINICAL DECISION MAKING: Stable/uncomplicated  EVALUATION COMPLEXITY: Low   GOALS: Goals reviewed with patient? Yes  SHORT TERM GOALS: Target date: 11/17/2023  Pt will be independent with HEP in order to improve strength and decrease back pain in order to improve pain-free function at home and work. Baseline: EVAL- No formal HEP in place Goal status: INITIAL  LONG TERM GOALS: Target date: 12/29/2023  Pt will demonstrate decrease in NDI by at least 19% in order to demonstrate  clinically significant reduction in disability related to neck injury/pain Baseline: EVAL= 46% Goal status: INITIAL  2.  Pt will decrease worst neck pain as reported on NPRS by at least 2 points in order to demonstrate clinically significant reduction in back pain Baseline: EVAL= 4/10 Goal status: INITIAL  3.  Patient will demonstrate improved cervical ROM by 15 deg each motion for improved head motion and ability to drive.  Baseline: EVAL- See cervical AROM section for values. Goal status: INITIAL   PLAN:  PT FREQUENCY: 1-2x/week  PT DURATION: 12 weeks  PLANNED INTERVENTIONS: 97164- PT Re-evaluation, 97110-Therapeutic exercises, 97530- Therapeutic activity, 97112- Neuromuscular re-education, 97535- Self Care, [id number]- Manual therapy, [phone]- Electrical stimulation (manual), Patient/Family education, Taping, Dry Needling, Joint mobilization, Joint manipulation, Spinal manipulation, Spinal mobilization, Cryotherapy, and Moist heat  PLAN FOR NEXT SESSION:  Manual therapy for pain relief and ROM,Progress in education in Cervical ROM and postural strengthening and add to HEP next visit.   Murlene Army, PT 10/20/2023, 9:58 AM

## 2023-10-20 ENCOUNTER — Ambulatory Visit

## 2023-10-20 DIAGNOSIS — M5382 Other specified dorsopathies, cervical region: Secondary | ICD-10-CM | POA: Diagnosis not present

## 2023-10-20 DIAGNOSIS — G243 Spasmodic torticollis: Secondary | ICD-10-CM | POA: Diagnosis not present

## 2023-10-20 DIAGNOSIS — M542 Cervicalgia: Secondary | ICD-10-CM

## 2023-10-20 DIAGNOSIS — M6281 Muscle weakness (generalized): Secondary | ICD-10-CM | POA: Diagnosis not present

## 2023-10-20 DIAGNOSIS — G931 Anoxic brain damage, not elsewhere classified: Secondary | ICD-10-CM | POA: Diagnosis not present

## 2023-10-22 ENCOUNTER — Encounter: Payer: Self-pay | Admitting: Internal Medicine

## 2023-10-24 ENCOUNTER — Ambulatory Visit

## 2023-10-24 DIAGNOSIS — M6281 Muscle weakness (generalized): Secondary | ICD-10-CM | POA: Diagnosis not present

## 2023-10-24 DIAGNOSIS — M5382 Other specified dorsopathies, cervical region: Secondary | ICD-10-CM | POA: Diagnosis not present

## 2023-10-24 DIAGNOSIS — M542 Cervicalgia: Secondary | ICD-10-CM

## 2023-10-24 DIAGNOSIS — G931 Anoxic brain damage, not elsewhere classified: Secondary | ICD-10-CM | POA: Diagnosis not present

## 2023-10-24 DIAGNOSIS — G243 Spasmodic torticollis: Secondary | ICD-10-CM | POA: Diagnosis not present

## 2023-10-24 NOTE — Therapy (Signed)
 OUTPATIENT PHYSICAL THERAPY CERVICAL TREATMENT   Patient Name: Larry [name] MRN: [id number] DOB:December 27, 1950, 73 y.o., male Today's Date: 10/24/2023  END OF SESSION:  PT End of Session - 10/24/23 0850     Visit Number 5    Number of Visits 24    Date for PT Re-Evaluation 12/29/23    Progress Note Due on Visit 10    PT Start Time 0848    PT Stop Time 0928    PT Time Calculation (min) 40 min    Activity Tolerance Patient tolerated treatment well;No increased pain    Behavior During Therapy WFL for tasks assessed/performed                Past Medical History:  Diagnosis Date   ADD (attention deficit disorder)    Allergy    seasonal flares of perinneal allergies   Cancer (HCC)    melanoma   Cardiac arrest (HCC) 07/1997   aborted   Dementia (HCC)    post resusitative   Depression    Diverticulosis    Family hx of prostate cancer    GERD (gastroesophageal reflux disease)    Headache(784.0)    Hyperlipidemia    Hypothyroidism    ICD (implantable cardiac defibrillator) in place    Idiopathic urticaria    Internal hemorrhoids    Low back pain syndrome    Melanoma in situ of skin of trunk (HCC)    chest   PVC (premature ventricular contraction)    associated with cardiac arrest   Sleep apnea    no c-pap   Tubular adenoma of colon 10/2010   Past Surgical History:  Procedure Laterality Date    implantation x2     CARDIAC DEFIBRILLATOR PLACEMENT  07/01/2010   Explanation of a previously implanted device, pocket revision, and insertion of a new device, and intraoperative defibrillation threshold testing Rodolfo Clan)   CARDIAC DEFIBRILLATOR REMOVAL  07/05/2002   Defibrillator change Rodolfo Clan) 3 times changed   CATARACT EXTRACTION W/PHACO Left 02/04/2021   Procedure: CATARACT EXTRACTION PHACO AND INTRAOCULAR LENS PLACEMENT (IOC) LEFT VIVITY toric LENS 9.21 01:11.6;  Surgeon: Annell Kidney, MD;  Location: [facility];  Service: Ophthalmology;  Laterality:  Left;  keep patient at 11:30 arrival   CATARACT EXTRACTION W/PHACO Right 03/04/2021   Procedure: CATARACT EXTRACTION PHACO AND INTRAOCULAR LENS PLACEMENT (IOC) RIGHT VIVITY LENS 8.59 01:15.3;  Surgeon: Annell Kidney, MD;  Location: [facility];  Service: Ophthalmology;  Laterality: Right;   COLONOSCOPY     HEMORRHOID SURGERY     ICD     TONSILLECTOMY     Patient Active Problem List   Diagnosis Date Noted   Cervical dystonia 06/15/2022   Neck pain 07/02/2021   Gait abnormality 12/16/2020   Nonintractable headache 09/30/2020   Tinnitus 09/09/2019   History of sudden cardiac arrest 01/09/2019   Viral syndrome 09/06/2018   Seizures (HCC) 03/02/2018   Syncope 12/26/2017   Other social stressor 12/26/2017   Health care maintenance 08/14/2017   Family hx of prostate cancer    Allergy    Insomnia 10/20/2015   Advance care planning 03/08/2014   Memory loss 09/14/2013   Hypoxic brain injury (HCC) 09/14/2013   Medicare annual wellness visit, subsequent 02/16/2013   Shortness of breath 08/10/2011   Implantable cardioverter-defibrillator (ICD) in situ 02/03/2009   Hypothyroidism 11/18/2008   CONTACT DERMATITIS&OTHER ECZEMA DUE TO PLANTS 02/03/2008   IDIOPATHIC URTICARIA 02/03/2008   Hypertension 02/03/2008   HLD (hyperlipidemia) 10/27/2007   Attention deficit  disorder 11-05-07   PVC/VT non sustained 2007-11-05   Sleep apnea 11/05/07   HEADACHE 11/05/07   SUDDEN DEATH-aborted 11/05/07   LOW BACK PAIN SYNDROME 10/11/2007    PCP: Dr. Richrd Char  REFERRING PROVIDER: Dr. Phebe Brasil  REFERRING DIAG:  G24.3 (ICD-10-CM) - Cervical dystonia  M54.2 (ICD-10-CM) - Neck pain    THERAPY DIAG:   Rationale for Evaluation and Treatment: Rehabilitation  ONSET DATE: 2020  SUBJECTIVE:                                                                                                                                                                                                          SUBJECTIVE STATEMENT: From Today: Patient  Patient reports neck is doing okay so far today but also states he already took a pain pill and muscle relaxer.   From EVAL:  Patient reports having long standing neck tightness/pain. Does receive botox  injection with some relief yet very limited motion. If I try to move my neck- it just hurts.    Hand dominance: Right  PERTINENT HISTORY:  Anoxic brain injury in 1999 due to prolonged cardiac arrest,             With residual mild cognitive impairment             Slow worsening over the years             Continue Aricept  10 mg daily, Namenda  10 mg twice a day                 Seizure             Most recent in June 2019, EEG was normal             Continue lamotrigine  100 mg twice a day   Gait abnormality, worsening neck pain, limited range of motion, abnormal neck posturing, mild anterocollis, right tilt, significant tenderness along cervical paraspinal muscles, worse on the right side              CT cervical spine in June 2023, multilevel degenerative changes, severe right-sided C1-C2 osteoarthritis, variable degree of foraminal narrowing, no significant canal stenosis             He failed epidural injection, complains of increased confusion dizziness, unsteadiness with tizanidine, tramadol treatment,             Responded well to low-dose Botox  injection,             Refer to physical therapy  EMG guided Botox  injection, used 100 units today (100 units dissolving to 2 cc of normal saline)   Right levator scapular 25 units Right longissimus capitis 12.5 units Right semispinalis 12.5 units Right splenius capitis 12.5 units Right splenius cervix 12.5 units Right iliocostalis 12.5 units Right upper trapezius 12.5 units   He tolerated injection well, will return to clinic in 3 months for repeat injection    PAIN:  Are you having pain? Yes: NPRS scale: 4/10 neck pain R> L and upper trap and having some low  back pain as well.   Pain location: posterior neck  Pain description: ache yet sharp Aggravating factors: active head motion, lifting,  Relieving factors: rest, Injections  PRECAUTIONS: Fall  RED FLAGS: None     WEIGHT BEARING RESTRICTIONS: No  FALLS:  Has patient fallen in last 6 months? Yes. Number of falls 3-4  LIVING ENVIRONMENT: Lives with: lives with their spouse Lives in: House/apartment Stairs: No Has following equipment at home: None  OCCUPATION: not working  PLOF: Independent  PATIENT GOALS: Move my neck better for safety and improve my pain  NEXT MD VISIT: unsure  OBJECTIVE:  Note: Objective measures were completed at Evaluation unless otherwise noted.  DIAGNOSTIC FINDINGS:  CLINICAL DATA:  Chronic, progressively worsening neck pain.   EXAM: CT CERVICAL SPINE WITHOUT CONTRAST   TECHNIQUE: Multidetector CT imaging of the cervical spine was performed without intravenous contrast. Multiplanar CT image reconstructions were also generated.   RADIATION DOSE REDUCTION: This exam was performed according to the departmental dose-optimization program which includes automated exposure control, adjustment of the mA and/or kV according to patient size and/or use of iterative reconstruction technique.   COMPARISON:  Cervical spine x-rays dated July 16, 2021.   FINDINGS: Alignment: Straightening of the normal cervical lordosis. Trace anterolisthesis at C3-C4 and C7-T1.   Skull base and vertebrae: No acute fracture. Severe right-sided C1-C2 osteoarthritis. No primary bone lesion or focal pathologic process. C4 vertebral bone island extending into the left posterior elements.   Soft tissues and spinal canal: No prevertebral fluid or swelling. No visible canal hematoma.   Disc levels:   C2-C3: Small central disc protrusion. Moderate right facet arthropathy. No stenosis.   C3-C4: Small central disc protrusion. Mild bilateral uncovertebral hypertrophy.  Moderate bilateral facet arthropathy. No stenosis.   C4-C5: Small posterior disc osteophyte complex. Moderate right and mild left uncovertebral hypertrophy. Moderate right facet arthropathy. Mild right neuroforaminal stenosis. No spinal canal or left neuroforaminal stenosis.   C5-C6: Small posterior disc osteophyte complex and moderate bilateral uncovertebral hypertrophy. No stenosis.   C6-C7: Small posterior disc osteophyte complex asymmetric to the right. Moderate left and mild right uncovertebral hypertrophy. Mild left neuroforaminal stenosis. No spinal canal or right neuroforaminal stenosis.   C7-T1: Negative disc. Mild bilateral facet uncovertebral hypertrophy. No stenosis.   Upper chest: Negative.   Other: None.   IMPRESSION: 1. Multilevel degenerative changes of the cervical spine as described above. No high-grade spinal canal or neuroforaminal stenosis. 2. Severe right-sided C1-C2 osteoarthritis.     Electronically Signed   By: Aleta Anda M.D.   On: 12/28/2021 13:55    PATIENT SURVEYS:  NDI 46%  COGNITION: Overall cognitive status: History of cognitive impairments - at baseline  SENSATION: WFL  POSTURE:  Very stiff c-spine  PALPATION: (+) right sided tenderness occiput down to UT region   CERVICAL ROM:   Active ROM A/PROM (deg) eval  Flexion 25  Extension 6  Right lateral flexion 8  Left lateral  flexion 12  Right rotation 25  Left rotation 28   (Blank rows = not tested)  UPPER EXTREMITY ROM:  Active ROM Right eval Left eval  Shoulder flexion    Shoulder extension    Shoulder abduction    Shoulder adduction    Shoulder extension    Shoulder internal rotation    Shoulder external rotation    Elbow flexion    Elbow extension    Wrist flexion    Wrist extension    Wrist ulnar deviation    Wrist radial deviation    Wrist pronation    Wrist supination     (Blank rows = not tested)  UPPER EXTREMITY MMT:  MMT Right eval  Left eval  Shoulder flexion 2- 2-  Shoulder extension    Shoulder abduction 2- 2-  Shoulder adduction    Shoulder extension    Shoulder internal rotation    Shoulder external rotation    Middle trapezius    Lower trapezius    Elbow flexion 4 4  Elbow extension 4 4  Wrist flexion 4 4  Wrist extension 4 4  Wrist ulnar deviation    Wrist radial deviation    Wrist pronation 4 4  Wrist supination 4 4  Grip strength     (Blank rows = not tested)  CERVICAL SPECIAL TESTS:  Pain with all active movement - unable to test  FUNCTIONAL TESTS:  none  TREATMENT DATE: 10/24/23    Manual Therapy:  Suboccipital release- 60 sec hold x 3  STM to B posterior occiput/cervical into B levator/UT trap region x 10 min   Therex:  PROM to C-Spine- Rotation, SB, Flex/ext x  10 min. Scap retraction Supine-hold 3 sec 2x 10 Active Chin tuck (supine) -Hold 5 sec (difficulty breathing- instructed to count out loud)  Active chin tuck + head lift off x 10  Active ROM cervical Rotation and SB to end limits of ROM and hold 3 sec x 15 reps each direction.       PATIENT EDUCATION:  Education details: Exercise technique; importance of postural training Person educated: Patient and Spouse Education method: Explanation, Demonstration, Tactile cues, and Verbal cues Education comprehension: verbalized understanding and returned demonstration  HOME EXERCISE PROGRAM: Access Code: [email or web address] URL: [email or web address] Date: 10/20/2023 Prepared by: Ferrell Hu  Exercises - Standing Shoulder Row with Anchored Resistance  - 3 x weekly - 3 sets - 10-12 reps - Standing Row with Resistance with Anchored Resistance at Chest Height Palms Down  - 3 x weekly - 3 sets - 10-12 reps - Wall Angels  - 3 x weekly - 3 sets - 10 reps    Access Code: [email or web address] URL: [email or web address] Date: 10/11/2023 Prepared by: Ferrell Hu  Exercises - Supine Chin Tuck  - 1 x daily -  3 sets - 10 reps - Supine Deep Neck Flexor Training - Repetitions  - 1 x daily - 3 sets - 10 reps - Seated Cervical Retraction  - 1 x daily - 3 sets - 10 reps  ASSESSMENT:  CLINICAL IMPRESSION: Patient is a 73 y.o. male who was seen today for physical therapy treatment for cervical dystonia and neck pain. Patient was able to demo some progress with less stretching and more cervical mobility activities. He denied any worsening of pain with all activities today.  He will benefit from skilled PT to assist with pain relief, ROM and improved functional mobility for optimal quality of life.    OBJECTIVE IMPAIRMENTS: Abnormal gait, decreased balance,  decreased ROM, decreased strength, hypomobility, impaired flexibility, impaired UE functional use, and pain.   ACTIVITY LIMITATIONS: carrying, lifting, bending, and sleeping  PARTICIPATION LIMITATIONS: meal prep, cleaning, laundry, driving, shopping, community activity, and yard work  PERSONAL FACTORS: 1 comorbidity: anoxic brain injury  are also affecting patient's functional outcome.   REHAB POTENTIAL: Good  CLINICAL DECISION MAKING: Stable/uncomplicated  EVALUATION COMPLEXITY: Low   GOALS: Goals reviewed with patient? Yes  SHORT TERM GOALS: Target date: 11/17/2023  Pt will be independent with HEP in order to improve strength and decrease back pain in order to improve pain-free function at home and work. Baseline: EVAL- No formal HEP in place Goal status: INITIAL  LONG TERM GOALS: Target date: 12/29/2023  Pt will demonstrate decrease in NDI by at least 19% in order to demonstrate clinically significant reduction in disability related to neck injury/pain Baseline: EVAL= 46% Goal status: INITIAL  2.  Pt will decrease worst neck pain as reported on NPRS by at least 2 points in order to demonstrate clinically significant reduction in back pain Baseline: EVAL= 4/10 Goal status: INITIAL  3.  Patient will demonstrate improved cervical ROM by  15 deg each motion for improved head motion and ability to drive.  Baseline: EVAL- See cervical AROM section for values. Goal status: INITIAL   PLAN:  PT FREQUENCY: 1-2x/week  PT DURATION: 12 weeks  PLANNED INTERVENTIONS: 97164- PT Re-evaluation, 97110-Therapeutic exercises, 97530- Therapeutic activity, 97112- Neuromuscular re-education, 97535- Self Care, 16109- Manual therapy, [phone]- Electrical stimulation (manual), Patient/Family education, Taping, Dry Needling, Joint mobilization, Joint manipulation, Spinal manipulation, Spinal mobilization, Cryotherapy, and Moist heat  PLAN FOR NEXT SESSION:  Manual therapy for pain relief and ROM,Progress in education in Cervical ROM and postural strengthening and add to HEP next visit. RE-measure cervical ROM- next visit   Murlene Army, PT 10/24/2023, 1:15 PM

## 2023-10-26 ENCOUNTER — Ambulatory Visit: Payer: Medicare Other | Admitting: Neurology

## 2023-10-26 ENCOUNTER — Ambulatory Visit

## 2023-10-26 DIAGNOSIS — M542 Cervicalgia: Secondary | ICD-10-CM | POA: Diagnosis not present

## 2023-10-26 DIAGNOSIS — M5382 Other specified dorsopathies, cervical region: Secondary | ICD-10-CM

## 2023-10-26 DIAGNOSIS — G243 Spasmodic torticollis: Secondary | ICD-10-CM | POA: Diagnosis not present

## 2023-10-26 DIAGNOSIS — M6281 Muscle weakness (generalized): Secondary | ICD-10-CM

## 2023-10-26 DIAGNOSIS — G931 Anoxic brain damage, not elsewhere classified: Secondary | ICD-10-CM | POA: Diagnosis not present

## 2023-10-26 NOTE — Therapy (Signed)
 OUTPATIENT PHYSICAL THERAPY CERVICAL TREATMENT   Patient Name: Larry [name] MRN: [id number] DOB:1951/02/20, 73 y.o., male Today's Date: 10/26/2023  END OF SESSION:  PT End of Session - 10/26/23 0846     Visit Number 6    Number of Visits 24    Date for PT Re-Evaluation 12/29/23    Progress Note Due on Visit 10    PT Start Time 0846    PT Stop Time 0925    PT Time Calculation (min) 39 min    Activity Tolerance Patient tolerated treatment well;No increased pain    Behavior During Therapy WFL for tasks assessed/performed                 Past Medical History:  Diagnosis Date   ADD (attention deficit disorder)    Allergy    seasonal flares of perinneal allergies   Cancer (HCC)    melanoma   Cardiac arrest (HCC) 07/1997   aborted   Dementia (HCC)    post resusitative   Depression    Diverticulosis    Family hx of prostate cancer    GERD (gastroesophageal reflux disease)    Headache(784.0)    Hyperlipidemia    Hypothyroidism    ICD (implantable cardiac defibrillator) in place    Idiopathic urticaria    Internal hemorrhoids    Low back pain syndrome    Melanoma in situ of skin of trunk (HCC)    chest   PVC (premature ventricular contraction)    associated with cardiac arrest   Sleep apnea    no c-pap   Tubular adenoma of colon 10/2010   Past Surgical History:  Procedure Laterality Date    implantation x2     CARDIAC DEFIBRILLATOR PLACEMENT  07/01/2010   Explanation of a previously implanted device, pocket revision, and insertion of a new device, and intraoperative defibrillation threshold testing Rodolfo Clan)   CARDIAC DEFIBRILLATOR REMOVAL  07/05/2002   Defibrillator change Rodolfo Clan) 3 times changed   CATARACT EXTRACTION W/PHACO Left 02/04/2021   Procedure: CATARACT EXTRACTION PHACO AND INTRAOCULAR LENS PLACEMENT (IOC) LEFT VIVITY toric LENS 9.21 01:11.6;  Surgeon: Annell Kidney, MD;  Location: [facility];  Service: Ophthalmology;  Laterality:  Left;  keep patient at 11:30 arrival   CATARACT EXTRACTION W/PHACO Right 03/04/2021   Procedure: CATARACT EXTRACTION PHACO AND INTRAOCULAR LENS PLACEMENT (IOC) RIGHT VIVITY LENS 8.59 01:15.3;  Surgeon: Annell Kidney, MD;  Location: [facility];  Service: Ophthalmology;  Laterality: Right;   COLONOSCOPY     HEMORRHOID SURGERY     ICD     TONSILLECTOMY     Patient Active Problem List   Diagnosis Date Noted   Cervical dystonia 06/15/2022   Neck pain 07/02/2021   Gait abnormality 12/16/2020   Nonintractable headache 09/30/2020   Tinnitus 09/09/2019   History of sudden cardiac arrest 01/09/2019   Viral syndrome 09/06/2018   Seizures (HCC) 03/02/2018   Syncope 12/26/2017   Other social stressor 12/26/2017   Health care maintenance 08/14/2017   Family hx of prostate cancer    Allergy    Insomnia 10/20/2015   Advance care planning 03/08/2014   Memory loss 09/14/2013   Hypoxic brain injury (HCC) 09/14/2013   Medicare annual wellness visit, subsequent 02/16/2013   Shortness of breath 08/10/2011   Implantable cardioverter-defibrillator (ICD) in situ 02/03/2009   Hypothyroidism 11/18/2008   CONTACT DERMATITIS&OTHER ECZEMA DUE TO PLANTS 02/03/2008   IDIOPATHIC URTICARIA 02/03/2008   Hypertension 02/03/2008   HLD (hyperlipidemia) 10/27/2007   Attention  deficit disorder 11/08/2007   PVC/VT non sustained 2007/11/08   Sleep apnea 2007-11-08   HEADACHE Nov 08, 2007   SUDDEN DEATH-aborted 11-08-2007   LOW BACK PAIN SYNDROME 10/11/2007    PCP: Dr. Richrd Char  REFERRING PROVIDER: Dr. Phebe Brasil  REFERRING DIAG:  G24.3 (ICD-10-CM) - Cervical dystonia  M54.2 (ICD-10-CM) - Neck pain    THERAPY DIAG:   Rationale for Evaluation and Treatment: Rehabilitation  ONSET DATE: 2020  SUBJECTIVE:                                                                                                                                                                                                          SUBJECTIVE STATEMENT: From Today: Patient reports taking his meds more consistent and feels that has made a difference- Feeling a little better- rates at 6/10.   From EVAL:  Patient reports having long standing neck tightness/pain. Does receive botox  injection with some relief yet very limited motion. If I try to move my neck- it just hurts.    Hand dominance: Right  PERTINENT HISTORY:  Anoxic brain injury in 1999 due to prolonged cardiac arrest,             With residual mild cognitive impairment             Slow worsening over the years             Continue Aricept  10 mg daily, Namenda  10 mg twice a day                 Seizure             Most recent in June 2019, EEG was normal             Continue lamotrigine  100 mg twice a day   Gait abnormality, worsening neck pain, limited range of motion, abnormal neck posturing, mild anterocollis, right tilt, significant tenderness along cervical paraspinal muscles, worse on the right side              CT cervical spine in June 2023, multilevel degenerative changes, severe right-sided C1-C2 osteoarthritis, variable degree of foraminal narrowing, no significant canal stenosis             He failed epidural injection, complains of increased confusion dizziness, unsteadiness with tizanidine, tramadol treatment,             Responded well to low-dose Botox  injection,             Refer to physical therapy  EMG guided Botox  injection, used 100 units today (100 units dissolving to 2 cc of normal saline)   Right levator scapular 25 units Right longissimus capitis 12.5 units Right semispinalis 12.5 units Right splenius capitis 12.5 units Right splenius cervix 12.5 units Right iliocostalis 12.5 units Right upper trapezius 12.5 units   He tolerated injection well, will return to clinic in 3 months for repeat injection    PAIN:  Are you having pain? Yes: NPRS scale: 4/10 neck pain R> L and upper trap and having some  low back pain as well.   Pain location: posterior neck  Pain description: ache yet sharp Aggravating factors: active head motion, lifting,  Relieving factors: rest, Injections  PRECAUTIONS: Fall  RED FLAGS: None     WEIGHT BEARING RESTRICTIONS: No  FALLS:  Has patient fallen in last 6 months? Yes. Number of falls 3-4  LIVING ENVIRONMENT: Lives with: lives with their spouse Lives in: House/apartment Stairs: No Has following equipment at home: None  OCCUPATION: not working  PLOF: Independent  PATIENT GOALS: Move my neck better for safety and improve my pain  NEXT MD VISIT: unsure  OBJECTIVE:  Note: Objective measures were completed at Evaluation unless otherwise noted.  DIAGNOSTIC FINDINGS:  CLINICAL DATA:  Chronic, progressively worsening neck pain.   EXAM: CT CERVICAL SPINE WITHOUT CONTRAST   TECHNIQUE: Multidetector CT imaging of the cervical spine was performed without intravenous contrast. Multiplanar CT image reconstructions were also generated.   RADIATION DOSE REDUCTION: This exam was performed according to the departmental dose-optimization program which includes automated exposure control, adjustment of the mA and/or kV according to patient size and/or use of iterative reconstruction technique.   COMPARISON:  Cervical spine x-rays dated July 16, 2021.   FINDINGS: Alignment: Straightening of the normal cervical lordosis. Trace anterolisthesis at C3-C4 and C7-T1.   Skull base and vertebrae: No acute fracture. Severe right-sided C1-C2 osteoarthritis. No primary bone lesion or focal pathologic process. C4 vertebral bone island extending into the left posterior elements.   Soft tissues and spinal canal: No prevertebral fluid or swelling. No visible canal hematoma.   Disc levels:   C2-C3: Small central disc protrusion. Moderate right facet arthropathy. No stenosis.   C3-C4: Small central disc protrusion. Mild bilateral  uncovertebral hypertrophy. Moderate bilateral facet arthropathy. No stenosis.   C4-C5: Small posterior disc osteophyte complex. Moderate right and mild left uncovertebral hypertrophy. Moderate right facet arthropathy. Mild right neuroforaminal stenosis. No spinal canal or left neuroforaminal stenosis.   C5-C6: Small posterior disc osteophyte complex and moderate bilateral uncovertebral hypertrophy. No stenosis.   C6-C7: Small posterior disc osteophyte complex asymmetric to the right. Moderate left and mild right uncovertebral hypertrophy. Mild left neuroforaminal stenosis. No spinal canal or right neuroforaminal stenosis.   C7-T1: Negative disc. Mild bilateral facet uncovertebral hypertrophy. No stenosis.   Upper chest: Negative.   Other: None.   IMPRESSION: 1. Multilevel degenerative changes of the cervical spine as described above. No high-grade spinal canal or neuroforaminal stenosis. 2. Severe right-sided C1-C2 osteoarthritis.     Electronically Signed   By: Aleta Anda M.D.   On: 12/28/2021 13:55    PATIENT SURVEYS:  NDI 46%  COGNITION: Overall cognitive status: History of cognitive impairments - at baseline  SENSATION: WFL  POSTURE:  Very stiff c-spine  PALPATION: (+) right sided tenderness occiput down to UT region   CERVICAL ROM:   Active ROM A/PROM (deg) eval  Flexion 25  Extension 6  Right lateral flexion 8  Left lateral  flexion 12  Right rotation 25  Left rotation 28   (Blank rows = not tested)  UPPER EXTREMITY ROM:  Active ROM Right eval Left eval  Shoulder flexion    Shoulder extension    Shoulder abduction    Shoulder adduction    Shoulder extension    Shoulder internal rotation    Shoulder external rotation    Elbow flexion    Elbow extension    Wrist flexion    Wrist extension    Wrist ulnar deviation    Wrist radial deviation    Wrist pronation    Wrist supination     (Blank rows = not tested)  UPPER EXTREMITY  MMT:  MMT Right eval Left eval  Shoulder flexion 2- 2-  Shoulder extension    Shoulder abduction 2- 2-  Shoulder adduction    Shoulder extension    Shoulder internal rotation    Shoulder external rotation    Middle trapezius    Lower trapezius    Elbow flexion 4 4  Elbow extension 4 4  Wrist flexion 4 4  Wrist extension 4 4  Wrist ulnar deviation    Wrist radial deviation    Wrist pronation 4 4  Wrist supination 4 4  Grip strength     (Blank rows = not tested)  CERVICAL SPECIAL TESTS:  Pain with all active movement - unable to test  FUNCTIONAL TESTS:  none  TREATMENT DATE: 10/26/23   Pre-Treatment Cervical ROM:   Flex= 15 Ext=22 R ROT=20 L ROT=22 R SB=10 L SB=12   Manual Therapy:  Suboccipital release- 60 sec hold x 2  PIVM- C-spine- C3-6- with rotation mobs with rotation and side glide with sidebending x 10  STM to B posterior occiput/cervical into B levator/UT trap region x 16 min   Therex:  PROM to C-Spine- Rotation, SB, Flex/ext x  10 min.  Scap retraction Supine-hold 3 sec 2x 10  Active Chin tuck (supine) -Hold 5 sec  x 10   Active ROM cervical Rotation and SB to end limits of ROM and hold 3 sec x 15 reps each direction.  Post-Treatment Cervical ROM:   Flex= 23 Ext=26 R ROT=30 L ROT=32 R SB=14 L SB=12     PATIENT EDUCATION:  Education details: Exercise technique; importance of postural training Person educated: Patient and Spouse Education method: Explanation, Demonstration, Tactile cues, and Verbal cues Education comprehension: verbalized understanding and returned demonstration  HOME EXERCISE PROGRAM: Access Code: [email or web address] URL: [email or web address] Date: 10/20/2023 Prepared by: Ferrell Hu  Exercises - Standing Shoulder Row with Anchored Resistance  - 3 x weekly - 3 sets - 10-12 reps - Standing Row with Resistance with Anchored Resistance at Chest Height Palms Down  - 3 x weekly - 3 sets - 10-12 reps -  Wall Angels  - 3 x weekly - 3 sets - 10 reps    Access Code: [email or web address] URL: [email or web address] Date: 10/11/2023 Prepared by: Ferrell Hu  Exercises - Supine Chin Tuck  - 1 x daily - 3 sets - 10 reps - Supine Deep Neck Flexor Training - Repetitions  - 1 x daily - 3 sets - 10 reps - Seated Cervical Retraction  - 1 x daily - 3 sets - 10 reps  ASSESSMENT:  CLINICAL IMPRESSION: Patient is a 73 y.o. male who was seen today for physical therapy treatment for cervical dystonia and neck pain. Patient presents with much improved cervical mobility/ROM after treatment vs. Pretreatment. Patient exhibited most stiffness with left side side  bending. Encouraged him in front of spouse to be as compliant with HEP for max therapy benefit. Will continue to progress ROM and postural training as appropriate.   He will benefit from skilled PT to assist with pain relief, ROM and improved functional mobility for optimal quality of life.    OBJECTIVE IMPAIRMENTS: Abnormal gait, decreased balance, decreased ROM, decreased strength, hypomobility, impaired flexibility, impaired UE functional use, and pain.   ACTIVITY LIMITATIONS: carrying, lifting, bending, and sleeping  PARTICIPATION LIMITATIONS: meal prep, cleaning, laundry, driving, shopping, community activity, and yard work  PERSONAL FACTORS: 1 comorbidity: anoxic brain injury  are also affecting patient's functional outcome.   REHAB POTENTIAL: Good  CLINICAL DECISION MAKING: Stable/uncomplicated  EVALUATION COMPLEXITY: Low   GOALS: Goals reviewed with patient? Yes  SHORT TERM GOALS: Target date: 11/17/2023  Pt will be independent with HEP in order to improve strength and decrease back pain in order to improve pain-free function at home and work. Baseline: EVAL- No formal HEP in place Goal status: INITIAL  LONG TERM GOALS: Target date: 12/29/2023  Pt will demonstrate decrease in NDI by at least 19% in order to demonstrate  clinically significant reduction in disability related to neck injury/pain Baseline: EVAL= 46% Goal status: INITIAL  2.  Pt will decrease worst neck pain as reported on NPRS by at least 2 points in order to demonstrate clinically significant reduction in back pain Baseline: EVAL= 4/10 Goal status: INITIAL  3.  Patient will demonstrate improved cervical ROM by 15 deg each motion for improved head motion and ability to drive.  Baseline: EVAL- See cervical AROM section for values. Goal status: INITIAL   PLAN:  PT FREQUENCY: 1-2x/week  PT DURATION: 12 weeks  PLANNED INTERVENTIONS: 97164- PT Re-evaluation, 97110-Therapeutic exercises, 97530- Therapeutic activity, 97112- Neuromuscular re-education, 97535- Self Care, 08657- Manual therapy, [phone]- Electrical stimulation (manual), Patient/Family education, Taping, Dry Needling, Joint mobilization, Joint manipulation, Spinal manipulation, Spinal mobilization, Cryotherapy, and Moist heat  PLAN FOR NEXT SESSION:  Manual therapy for pain relief and ROM,Progress in education in Cervical ROM and postural strengthening and add to HEP next visit.   Murlene Army, PT 10/26/2023, 10:53 AM

## 2023-10-31 ENCOUNTER — Ambulatory Visit

## 2023-10-31 ENCOUNTER — Ambulatory Visit: Payer: Medicare Other | Admitting: Neurology

## 2023-10-31 VITALS — BP 177/103

## 2023-10-31 DIAGNOSIS — G931 Anoxic brain damage, not elsewhere classified: Secondary | ICD-10-CM

## 2023-10-31 DIAGNOSIS — M542 Cervicalgia: Secondary | ICD-10-CM

## 2023-10-31 DIAGNOSIS — G243 Spasmodic torticollis: Secondary | ICD-10-CM

## 2023-10-31 MED ORDER — ONABOTULINUMTOXINA 100 UNITS IJ SOLR
100.0000 [IU] | Freq: Once | INTRAMUSCULAR | Status: AC
  Administered 2023-10-31: 100 [IU] via INTRAMUSCULAR

## 2023-10-31 NOTE — Progress Notes (Signed)
 Botox - 100 units x 1 vials Lot: [id number] Expiration: 2027/05 NDC: 8469-6295-28  Bacteriostatic 0.9% Sodium Chloride - 2 mL  Lot: [id number] Expiration: 05/05/24 NDC: 0102725366  Dx: G32.3  B/B Witnessed by Anthony Bateman CMA

## 2023-10-31 NOTE — Progress Notes (Signed)
 ASSESSMENT AND PLAN 73 y.o. year old male    Anoxic brain injury in 1999 due to prolonged cardiac arrest,  With residual mild cognitive impairment  Slow worsening over the years  Continue Aricept  10 mg daily, Namenda  10 mg twice a day    Seizure  Most recent in June 2019, EEG was normal  Continue lamotrigine  100 mg twice a day  Gait abnormality, worsening neck pain, limited range of motion, abnormal neck posturing, mild anterocollis, right tilt, significant tenderness along cervical paraspinal muscles, worse on the right side   CT cervical spine in June 2023, multilevel degenerative changes, severe right-sided C1-C2 osteoarthritis, variable degree of foraminal narrowing, no significant canal stenosis  He failed epidural injection, complains of increased confusion dizziness, unsteadiness with tizanidine, tramadol treatment,  Responded well to low-dose Botox  injection,  Refer to physical therapy    EMG guided Botox  injection, used 100 units today (100 units dissolving to 2 cc of normal saline)  Right levator scapular 25 units  Right semispinalis 12.5 units Right splenius capitis 12.5 units Right splenius cervix 12.5 units    Left levator scapular 25 units  Left semispinalis 12.5 units Left splenius capitis 12.5 units Left splenius cervix 12.5 units   He tolerated injection well, will return to clinic in 3 months for repeat injection  DIAGNOSTIC DATA (LABS, IMAGING, TESTING) - I reviewed patient records, labs, notes, testing and imaging myself where available.   HISTORY OF PRESENT ILLNESS:  [name] is a very friendly 73 year old right-handed gentleman presents for followup consultation of his memory loss in the context of anoxic brain injury.    He is accompanied by his wife today. He was a patient of Dr. Dania Dupre, The patient has an underlying medical history of cardiac death from ventricular fibrillation with hypoxic brain injury in Jan 1999 due to arrythmia, 15 minutes  without heart beat,  status post defibrillator placement, He stayed in hospital for 35 days, required prolonged rehabilitation, has to relearn walking again, significant amnesia, "as if that he did not have a past".   EEG showed diffuse slowing in January 1999, CT head and brain MRI were normal. CT head repeat in November 2000 showed slight prominence of the lateral and third ventricles. CT head in July 2003 showed prominence of the ventricular system. Psychological testing and August 1999 showed borderline performance IQ. He became depressed. In January 2014 his MMSE was 29, clock drawing was 4, animal fluency was 10.  He went to [facility] for 2 years for patient with degenerative disorder.   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 He lives at home with his wife, still driving.   He helps the household, he still has difficulty focusing.    He has had some fluctuation in his memory per wife. He has ADD and continues to take Adderall, 10 mg twice a day, without Adderall, he could not remember  the pages that he has read,he is also taking Aricept  10 mg every day, which has been very helpful too.   Up date April 14 2016YY   His overall doing very well, he likes play computer games, which has helped his eye and hands coordination, he had recent trip to [place], felt overwhelming, he continued to drive short distance,   He is taking Ritalin 10 mg twice a day, Aricept  10 mg once a day, complains of insomnia, he took his last dose of Ritalin at  evening time.   UPDATE Apr 21 2015:YY He is now taking Namenda  10mg  bid and aricept  10mg  qday, he still has trouble sleeping,  He watch movie, stay up late, he continues to have focus difficulty, tends to become agitated.   UPDATE April 27th 2017: YY He still has chronic insomnia, tends to have irregular sleep pattern, stay up late watching TV, he still very active during the day, take  Adderall 10 mg twice a day, still drives short distance, mild unsteady gait, tends to hold his left arm to his chest   UPDATE Oct 17th 2017:YY He is with his wife,  Her daughter was hospital, presented with sudden loss of consciousness yesterday April 19 2016, which has brought back a lot of his memory, he has not tried clonazepam  for chronic insomnia, worry about side effect, tried melatonin, still has significant insomnia, he is taking Tylenol  pm,    UPDATE March 02 2018: He was follow-up by Deadra Everts in 2018, on December 19, 2017, visiting his daughter at [place] , in the morning time, he had sudden onset dizziness, feels like going to pass out, then had transient loss of consciousness, eyes rolled back, staring, lasting for 1 minutes, followed by postevent mild confusion and slurred speech last about 40 minutes, was treated at local hospital.   I personally reviewed CT head without contrast: There was no acute abnormality, generalized atrophy, ventriculomegaly,  laboratory evaluation, EKG showed no significant abnormality.   Similar spells in March 2019, transient dizziness followed by sudden loss of muscle tone, consciousness, extreme fatigue, slept for 1 hour afterwards,   Patient reported that he actually has frequent spells about once a month, he has transient confusion, feels like he is going to pass out, short lasting,   UPDATE Jun 05 2018: He tolerated lamotrigine  very well, taking 100 mg twice a day without significant side effect,   EEG was normal.  Since lamotrigine , he no longer has recurrent spells of rising sensation transient confusion even though there was no clinical seizure activity observed during those spells, he had those spells about once a week prior to lamotrigine ,  UPDATE Jun 19 2020: Is accompanied by his wife at today's clinical visit, slow worsening memory loss, when he goes to a different room, he often forgets why is he there, slight worsening gait abnormality, he  also complains of worsening tinnitus, difficulty sleeping sometimes,  I personally reviewed CT temporal bone w/wo in April 2021:  1. Negative CT appearance of the bilateral temporal bones. No explanation for hearing loss. 2. Note a mildly high riding right jugular bulb, with thin but intact overlying bony covering. 3. Hyperplastic but clear visible paranasal sinuses.  UPDATE March 29th 2022: He used to have headache even before his cardiac arrest, only occasionally, mild, his 2 daughter does has typical migraine headache,  Over the years, he has occasionally headache usually short lasting, on March 26, he complains of transient sharp pain shooting from the left parietal region downward, lasting for few seconds, then had a trickle of pressure headache, take ibuprofen, which did improve  Next morning March 27 Sunday, while at church, at the end of the sermon, he was noted to holding his left side, he also noticed transient blurry vision, was brought to the emergency room,  I personally reviewed CT head with contrast, no acute intracranial abnormality, no venous sinus thrombosis, arterial branch that was visualized showed no significant abnormality  Laboratory showed normal ESR, negative troponin, normal CBC, BMP with exception of elevated  glucose 140, was seen by ophthalmology today, no significant abnormality found, was told he has cataract, left side was slightly worse than the right  Update June 14th 2022: I reviewed his headache diary, few episode in March, transient sharp, only 1 episode on October 19, 2020, no longer has frequent headaches, reported worsening vision bilaterally, left worse than right, on today's near chart examination, visual acuity was 20/50 OS/OD,  Also complains of worsening gait abnormality, wife noticed worsening memory loss,  We personally reviewed CT of temporal bone with without contrast in April 2021 for evaluation of his reported hearing loss, no acute  abnormality, CT venogram of brain on September 28, 2020 that was normal  Update July 02, 2021: Is accompanied by his wife, continued slow worsening, increased memory loss, slow reaction time, continue to drive, wife has to be vigilant sitting at the passenger side, MoCA examination 22/30 today,  Over the past few months, he had few episode of transient ice pricking pain, not long-lasting, He also complains of back pain, rigidity, decreased range of motion  UPDATE December 21 2021: He continues to have slowing memory loss, MoCA examination 25/30, actually improved from previous memory of 22/30, he has slower reaction time, take time to figure out things,  He did have a history of obstructive sleep apnea, tried different facial mask, including nasal cannula in the past, could not tolerate it,  He also complains of worsening neck pain, stiffness, limited range of motion of his neck, somewhat improvement by physical therapy and dry needling, he denies bowel and bladder incontinence  Personally reviewed x-ray of cervical spine, mild to moderate multilevel cervical degenerative disc disease worse at C6-7  UPDATE Jun 15 2022: He is with his wife, complains of worsening short term memory loss, difficulty multitasking, feel frustrated easily,  He complains of worsening neck pain, difficulty turning his neck, 8 out of 10 constant achy pain, worsening shooting pain to right shoulder, occipital region moving his neck, especially looking to the left, underwent epidural injection without help his symptoms much.  UPDATE Aug 11 2022: Here for his first EMG guided Botox  a injection for severe neck pain, especially at the right side CT cervical spine June 2023, multilevel degenerative changes, severe right-sided C1-C2 osteoarthritis, tight right cervical paraspinal muscles, tenderness upon deep palpitation,  UPDATE Nov 08 2022: Moderate improvement of his neck pain following his initial Botox  injection in  February 2024, used 200 units, no ill effect noticed  Wife complains of worsening cognitive impairment, climbing up a ladder, almost fell  UPDATE Jan 28th 2025: Every 3 months low-dose Botox  injection did help his neck pain, no significant side effect, he tends to have right cervical paraspinal muscle tension, achy pain  Wife reported to him having worsening memory loss, gait abnormality, was not sure about the benefit of Aricept , Namenda , but tolerating it well  UPDATE April 28th 2025: He did well with previous injection, fell couple times, now with low back pain, doing physical therapy   REVIEW OF SYSTEMS: Out of a complete 14 system review of symptoms, the patient complains only of the following symptoms, and all other reviewed systems are negative.  Memory loss, seizures  PHYSICAL EXAM  Vitals:   06/15/22 1304  BP: (!) 142/89  Pulse: 78  Weight: 179 lb 6.4 oz (81.4 kg)  Height: 5\' 9"  (1.753 m)   Body mass index is 26.49 kg/m.  Generalized: Well developed, in no acute distress     06/15/2022    1:06  PM 12/21/2021   12:56 PM 07/02/2021   10:30 AM  Montreal Cognitive Assessment   Visuospatial/ Executive (0/5) 4 4 3   Naming (0/3) 3 3 3   Attention: Read list of digits (0/2) 2 2 2   Attention: Read list of letters (0/1) 1 1 1   Attention: Serial 7 subtraction starting at 100 (0/3) 3 3 2   Language: Repeat phrase (0/2) 1 1 1   Language : Fluency (0/1) 1 1 1   Abstraction (0/2) 2 2 2   Delayed Recall (0/5) 1 3 2   Orientation (0/6) 5 5 5   Total 23 25 22   Adjusted Score (based on education) 23       NEUROLOGICAL EXAM:  Neck: Limited range of motion, of neck, looking to either left or right, tenderness of cervical paraspinal muscles upon deep palpation, especially right levator scapula, upper and lower cervical regions, neck tilt to right shoulder, anterocollis,  MENTAL STATUS: Speech/Cognition: Slow reaction time time, cooperative on examination  CRANIAL NERVES: CN II:  Visual fields are full to confrontation.  Pupils are small, reactive CN III, IV, VI: extraocular movement are normal. No ptosis. CN V: Facial sensation is intact to light touch. CN VII: Face is symmetric with normal eye closure and smile. CN VIII: Hearing is normal to casual conversation CN IX, X: Palate elevates symmetrically. Phonation is normal. CN XI: Head turning and shoulder shrug are intact  MOTOR:  Fixation left upper extremity on rapid rotating movement  REFLEXES: Reflexes are 2  and symmetric at the biceps, triceps, knees and ankles. Plantar responses are flexor.  SENSORY: Intact to light touch, pinprick, positional and vibratory sensation at fingers and toes.  COORDINATION: There is no trunk or limb ataxia.    GAIT/STANCE: He needs push-up to get up from seated position, tends to hold his left arm in elbow flexion, mildly wide-based, unsteady   ALLERGIES: Allergies  Allergen Reactions   Penicillins     Difficult Breathing, rash    HOME MEDICATIONS: Outpatient Medications Prior to Visit  Medication Sig Dispense Refill   amphetamine -dextroamphetamine  (ADDERALL) 10 MG tablet TAKE ONE TABLET BY MOUTH TWICE A DAY 180 tablet 0   Ascorbic Acid (VITAMIN C) 500 MG tablet Take 500 mg by mouth daily.     aspirin 81 MG tablet Take 81 mg by mouth daily.     botulinum toxin Type A  (BOTOX ) 200 units injection Inject 200 units into the muscles every 3 months 1 each 3   Cholecalciferol (VITAMIN D3) 10 MCG (400 UNIT) tablet Take 400 Units by mouth daily.     diltiazem  (CARDIZEM  CD) 120 MG 24 hr capsule TAKE 1 CAPSULE BY MOUTH DAILY 100 capsule 2   donepezil  (ARICEPT ) 10 MG tablet Take 1 tablet (10 mg total) by mouth daily. 90 tablet 3   fexofenadine (ALLEGRA) 180 MG tablet Take 180 mg by mouth daily.     Ginkgo Biloba 40 MG TABS Take by mouth as directed.     lamoTRIgine  (LAMICTAL ) 100 MG tablet Take 1 tablet (100 mg total) by mouth 2 (two) times daily. 180 tablet 3    levothyroxine  (SYNTHROID ) 137 MCG tablet Take 1 tablet (137 mcg total) by mouth daily before breakfast. 100 tablet 3   Melatonin 5 MG CAPS Take by mouth as directed.     memantine  (NAMENDA ) 10 MG tablet Take 1 tablet (10 mg total) by mouth 2 (two) times daily. 180 tablet 3   metoprolol  succinate (TOPROL -XL) 25 MG 24 hr tablet Take 1 tablet (25 mg total) by  mouth daily. Will need visit for further refills. 100 tablet 1   montelukast  (SINGULAIR ) 10 MG tablet TAKE 1 TABLET BY MOUTH DAILY 100 tablet 2   Multiple Vitamin (MULTIVITAMIN) tablet Take 700 tablets by mouth daily.     NON FORMULARY Focus factor 1 tab po bid     rosuvastatin  (CRESTOR ) 20 MG tablet TAKE 1 TABLET BY MOUTH AT  BEDTIME 100 tablet 0   tiZANidine (ZANAFLEX) 4 MG tablet Take 2-4 mg by mouth 2 (two) times daily as needed.     traMADol (ULTRAM) 50 MG tablet Take 50 mg by mouth 2 (two) times daily as needed.     vitamin B-12 (CYANOCOBALAMIN) 1000 MCG tablet Take 1,000 mcg by mouth daily.     vitamin E 180 MG (400 UNITS) capsule Take 400 Units by mouth daily.     No facility-administered medications prior to visit.    PAST MEDICAL HISTORY: Past Medical History:  Diagnosis Date   ADD (attention deficit disorder)    Allergy    seasonal flares of perinneal allergies   Cancer (HCC)    melanoma   Cardiac arrest (HCC) 07/1997   aborted   Dementia (HCC)    post resusitative   Depression    Diverticulosis    Family hx of prostate cancer    GERD (gastroesophageal reflux disease)    Headache(784.0)    Hyperlipidemia    Hypothyroidism    ICD (implantable cardiac defibrillator) in place    Idiopathic urticaria    Internal hemorrhoids    Low back pain syndrome    Melanoma in situ of skin of trunk (HCC)    chest   PVC (premature ventricular contraction)    associated with cardiac arrest   Sleep apnea    no c-pap   Tubular adenoma of colon 10/2010    PAST SURGICAL HISTORY: Past Surgical History:  Procedure Laterality  Date    implantation x2     CARDIAC DEFIBRILLATOR PLACEMENT  07/01/2010   Explanation of a previously implanted device, pocket revision, and insertion of a new device, and intraoperative defibrillation threshold testing Rodolfo Clan)   CARDIAC DEFIBRILLATOR REMOVAL  07/05/2002   Defibrillator change Rodolfo Clan) 3 times changed   CATARACT EXTRACTION W/PHACO Left 02/04/2021   Procedure: CATARACT EXTRACTION PHACO AND INTRAOCULAR LENS PLACEMENT (IOC) LEFT VIVITY toric LENS 9.21 01:11.6;  Surgeon: Annell Kidney, MD;  Location: [facility];  Service: Ophthalmology;  Laterality: Left;  keep patient at 11:30 arrival   CATARACT EXTRACTION W/PHACO Right 03/04/2021   Procedure: CATARACT EXTRACTION PHACO AND INTRAOCULAR LENS PLACEMENT (IOC) RIGHT VIVITY LENS 8.59 01:15.3;  Surgeon: Annell Kidney, MD;  Location: [facility];  Service: Ophthalmology;  Laterality: Right;   COLONOSCOPY     HEMORRHOID SURGERY     ICD     TONSILLECTOMY      FAMILY HISTORY: Family History  Problem Relation Age of Onset   Heart disease Mother    Hypertension Mother    Heart disease Father    Diverticulosis Father    Coronary artery disease Father    Alopecia Father    Prostate cancer Brother    Diabetes Brother    Heart disease Brother        heart transplant   Lung cancer Paternal Grandfather        Smoker   Cancer Paternal Grandfather        lung cancer - smoker   Colon cancer Neg Hx    Esophageal cancer Neg Hx  Pancreatic cancer Neg Hx    Stomach cancer Neg Hx     SOCIAL HISTORY: Social History   Socioeconomic History   Marital status: Married    Spouse name: Thersia Flax   Number of children: 2   Years of education: College   Highest education level: Not on file  Occupational History    Employer: DISABLED  Tobacco Use   Smoking status: Former    Current packs/day: 0.00    Average packs/day: 1 pack/day for 10.0 years (10.0 ttl pk-yrs)    Types: Cigarettes    Start date: 07/05/1985     Quit date: 07/06/1995    Years since quitting: 28.3   Smokeless tobacco: Former    Types: Chew    Quit date: 11/04/1994  Vaping Use   Vaping status: Never Used  Substance and Sexual Activity   Alcohol use: No    Alcohol/week: 0.0 standard drinks of alcohol    Comment: socially   Drug use: No   Sexual activity: Not on file  Other Topics Concern   Not on file  Social History Narrative   Patient is married Thersia Flax) [phone] with 2 daughters   5 grandchildren   Daily caffeine use - one cup of coffee and one-two cokes per day   Patient is right-handed.   Patient has a college education   Social Drivers of Corporate investment banker Strain: Low Risk  (10/11/2023)   Overall Financial Resource Strain (CARDIA)    Difficulty of Paying Living Expenses: Not hard at all  Food Insecurity: No Food Insecurity (10/11/2023)   Hunger Vital Sign    Worried About Running Out of Food in the Last Year: Never true    Ran Out of Food in the Last Year: Never true  Transportation Needs: No Transportation Needs (10/11/2023)   PRAPARE - Administrator, Civil Service (Medical): No    Lack of Transportation (Non-Medical): No  Physical Activity: Inactive (10/11/2023)   Exercise Vital Sign    Days of Exercise per Week: 0 days    Minutes of Exercise per Session: 0 min  Stress: No Stress Concern Present (10/11/2023)   [employer] of Occupational Health - Occupational Stress Questionnaire    Feeling of Stress : Not at all  Social Connections: Socially Integrated (10/11/2023)   Social Connection and Isolation Panel [NHANES]    Frequency of Communication with Friends and Family: More than three times a week    Frequency of Social Gatherings with Friends and Family: More than three times a week    Attends Religious Services: More than 4 times per year    Active Member of [employer] or Organizations: Yes    Attends Banker Meetings: More than 4 times per year    Marital Status: Married  Careers information officer Violence: Not At Risk (10/11/2023)   Humiliation, Afraid, Rape, and Kick questionnaire    Fear of Current or Ex-Partner: No    Emotionally Abused: No    Physically Abused: No    Sexually Abused: No     Phebe Brasil, M.D. Ph.D.  [facility] [address] [place], [place] [address] Phone: [phone] Fax:      [phone]

## 2023-11-01 ENCOUNTER — Ambulatory Visit

## 2023-11-01 DIAGNOSIS — M542 Cervicalgia: Secondary | ICD-10-CM

## 2023-11-01 DIAGNOSIS — M5382 Other specified dorsopathies, cervical region: Secondary | ICD-10-CM | POA: Diagnosis not present

## 2023-11-01 DIAGNOSIS — G243 Spasmodic torticollis: Secondary | ICD-10-CM | POA: Diagnosis not present

## 2023-11-01 DIAGNOSIS — M6281 Muscle weakness (generalized): Secondary | ICD-10-CM | POA: Diagnosis not present

## 2023-11-01 DIAGNOSIS — G931 Anoxic brain damage, not elsewhere classified: Secondary | ICD-10-CM | POA: Diagnosis not present

## 2023-11-01 NOTE — Therapy (Signed)
 OUTPATIENT PHYSICAL THERAPY CERVICAL TREATMENT   Patient Name: Larry [name] MRN: [id number] DOB:[birth year], 73 y.o., male Today's Date: 11/01/2023  END OF SESSION:  PT End of Session - 11/01/23 0819     Visit Number 7    Number of Visits 24    Date for PT Re-Evaluation 12/29/23    Progress Note Due on Visit 10    PT Start Time 0802    PT Stop Time 0834    PT Time Calculation (min) 32 min    Activity Tolerance Patient tolerated treatment well;No increased pain    Behavior During Therapy WFL for tasks assessed/performed                  Past Medical History:  Diagnosis Date   ADD (attention deficit disorder)    Allergy    seasonal flares of perinneal allergies   Cancer (HCC)    melanoma   Cardiac arrest (HCC) 07/1997   aborted   Dementia (HCC)    post resusitative   Depression    Diverticulosis    Family hx of prostate cancer    GERD (gastroesophageal reflux disease)    Headache(784.0)    Hyperlipidemia    Hypothyroidism    ICD (implantable cardiac defibrillator) in place    Idiopathic urticaria    Internal hemorrhoids    Low back pain syndrome    Melanoma in situ of skin of trunk (HCC)    chest   PVC (premature ventricular contraction)    associated with cardiac arrest   Sleep apnea    no c-pap   Tubular adenoma of colon 10/2010   Past Surgical History:  Procedure Laterality Date    implantation x2     CARDIAC DEFIBRILLATOR PLACEMENT  07/01/2010   Explanation of a previously implanted device, pocket revision, and insertion of a new device, and intraoperative defibrillation threshold testing Rodolfo Clan)   CARDIAC DEFIBRILLATOR REMOVAL  07/05/2002   Defibrillator change Rodolfo Clan) 3 times changed   CATARACT EXTRACTION W/PHACO Left 02/04/2021   Procedure: CATARACT EXTRACTION PHACO AND INTRAOCULAR LENS PLACEMENT (IOC) LEFT VIVITY toric LENS 9.21 01:11.6;  Surgeon: Annell Kidney, MD;  Location: [facility];  Service: Ophthalmology;   Laterality: Left;  keep patient at 11:30 arrival   CATARACT EXTRACTION W/PHACO Right 03/04/2021   Procedure: CATARACT EXTRACTION PHACO AND INTRAOCULAR LENS PLACEMENT (IOC) RIGHT VIVITY LENS 8.59 01:15.3;  Surgeon: Annell Kidney, MD;  Location: [facility];  Service: Ophthalmology;  Laterality: Right;   COLONOSCOPY     HEMORRHOID SURGERY     ICD     TONSILLECTOMY     Patient Active Problem List   Diagnosis Date Noted   Cervical dystonia 06/15/2022   Neck pain 07/02/2021   Gait abnormality 12/16/2020   Nonintractable headache 09/30/2020   Tinnitus 09/09/2019   History of sudden cardiac arrest 01/09/2019   Viral syndrome 09/06/2018   Seizures (HCC) 03/02/2018   Syncope 12/26/2017   Other social stressor 12/26/2017   Health care maintenance 08/14/2017   Family hx of prostate cancer    Allergy    Insomnia 10/20/2015   Advance care planning 03/08/2014   Memory loss 09/14/2013   Hypoxic brain injury (HCC) 09/14/2013   Medicare annual wellness visit, subsequent 02/16/2013   Shortness of breath 08/10/2011   Implantable cardioverter-defibrillator (ICD) in situ 02/03/2009   Hypothyroidism 11/18/2008   CONTACT DERMATITIS&OTHER ECZEMA DUE TO PLANTS 02/03/2008   IDIOPATHIC URTICARIA 02/03/2008   Hypertension 02/03/2008   HLD (hyperlipidemia) 10/27/2007  Attention deficit disorder Nov 18, 2007   PVC/VT non sustained 2007/11/18   Sleep apnea 11-18-2007   HEADACHE 2007-11-18   SUDDEN DEATH-aborted 2007-11-18   LOW BACK PAIN SYNDROME 10/11/2007    PCP: Dr. Richrd Char  REFERRING PROVIDER: Dr. Phebe Brasil  REFERRING DIAG:  G24.3 (ICD-10-CM) - Cervical dystonia  M54.2 (ICD-10-CM) - Neck pain    THERAPY DIAG:   Rationale for Evaluation and Treatment: Rehabilitation  ONSET DATE: 2020  SUBJECTIVE:                                                                                                                                                                                                          SUBJECTIVE STATEMENT: From Today: Patient reports doing some better- feels the exercises are helping. Received botox  to neck region and feeling okay today.   From EVAL:  Patient reports having long standing neck tightness/pain. Does receive botox  injection with some relief yet very limited motion. If I try to move my neck- it just hurts.    Hand dominance: Right  PERTINENT HISTORY:  Anoxic brain injury in 1999 due to prolonged cardiac arrest,             With residual mild cognitive impairment             Slow worsening over the years             Continue Aricept  10 mg daily, Namenda  10 mg twice a day                 Seizure             Most recent in June 2019, EEG was normal             Continue lamotrigine  100 mg twice a day   Gait abnormality, worsening neck pain, limited range of motion, abnormal neck posturing, mild anterocollis, right tilt, significant tenderness along cervical paraspinal muscles, worse on the right side              CT cervical spine in June 2023, multilevel degenerative changes, severe right-sided C1-C2 osteoarthritis, variable degree of foraminal narrowing, no significant canal stenosis             He failed epidural injection, complains of increased confusion dizziness, unsteadiness with tizanidine, tramadol treatment,             Responded well to low-dose Botox  injection,             Refer to physical therapy  EMG guided Botox  injection, used 100 units today (100 units dissolving to 2 cc of normal saline)   Right levator scapular 25 units Right longissimus capitis 12.5 units Right semispinalis 12.5 units Right splenius capitis 12.5 units Right splenius cervix 12.5 units Right iliocostalis 12.5 units Right upper trapezius 12.5 units   He tolerated injection well, will return to clinic in 3 months for repeat injection    PAIN:  Are you having pain? Yes: NPRS scale: 4/10 neck pain R> L and upper trap and  having some low back pain as well.   Pain location: posterior neck  Pain description: ache yet sharp Aggravating factors: active head motion, lifting,  Relieving factors: rest, Injections  PRECAUTIONS: Fall  RED FLAGS: None     WEIGHT BEARING RESTRICTIONS: No  FALLS:  Has patient fallen in last 6 months? Yes. Number of falls 3-4  LIVING ENVIRONMENT: Lives with: lives with their spouse Lives in: House/apartment Stairs: No Has following equipment at home: None  OCCUPATION: not working  PLOF: Independent  PATIENT GOALS: Move my neck better for safety and improve my pain  NEXT MD VISIT: unsure  OBJECTIVE:  Note: Objective measures were completed at Evaluation unless otherwise noted.  DIAGNOSTIC FINDINGS:  CLINICAL DATA:  Chronic, progressively worsening neck pain.   EXAM: CT CERVICAL SPINE WITHOUT CONTRAST   TECHNIQUE: Multidetector CT imaging of the cervical spine was performed without intravenous contrast. Multiplanar CT image reconstructions were also generated.   RADIATION DOSE REDUCTION: This exam was performed according to the departmental dose-optimization program which includes automated exposure control, adjustment of the mA and/or kV according to patient size and/or use of iterative reconstruction technique.   COMPARISON:  Cervical spine x-rays dated July 16, 2021.   FINDINGS: Alignment: Straightening of the normal cervical lordosis. Trace anterolisthesis at C3-C4 and C7-T1.   Skull base and vertebrae: No acute fracture. Severe right-sided C1-C2 osteoarthritis. No primary bone lesion or focal pathologic process. C4 vertebral bone island extending into the left posterior elements.   Soft tissues and spinal canal: No prevertebral fluid or swelling. No visible canal hematoma.   Disc levels:   C2-C3: Small central disc protrusion. Moderate right facet arthropathy. No stenosis.   C3-C4: Small central disc protrusion. Mild bilateral  uncovertebral hypertrophy. Moderate bilateral facet arthropathy. No stenosis.   C4-C5: Small posterior disc osteophyte complex. Moderate right and mild left uncovertebral hypertrophy. Moderate right facet arthropathy. Mild right neuroforaminal stenosis. No spinal canal or left neuroforaminal stenosis.   C5-C6: Small posterior disc osteophyte complex and moderate bilateral uncovertebral hypertrophy. No stenosis.   C6-C7: Small posterior disc osteophyte complex asymmetric to the right. Moderate left and mild right uncovertebral hypertrophy. Mild left neuroforaminal stenosis. No spinal canal or right neuroforaminal stenosis.   C7-T1: Negative disc. Mild bilateral facet uncovertebral hypertrophy. No stenosis.   Upper chest: Negative.   Other: None.   IMPRESSION: 1. Multilevel degenerative changes of the cervical spine as described above. No high-grade spinal canal or neuroforaminal stenosis. 2. Severe right-sided C1-C2 osteoarthritis.     Electronically Signed   By: Aleta Anda M.D.   On: 12/28/2021 13:55    PATIENT SURVEYS:  NDI 46%  COGNITION: Overall cognitive status: History of cognitive impairments - at baseline  SENSATION: WFL  POSTURE:  Very stiff c-spine  PALPATION: (+) right sided tenderness occiput down to UT region   CERVICAL ROM:   Active ROM A/PROM (deg) eval  Flexion 25  Extension 6  Right lateral flexion 8  Left lateral  flexion 12  Right rotation 25  Left rotation 28   (Blank rows = not tested)  UPPER EXTREMITY ROM:  Active ROM Right eval Left eval  Shoulder flexion    Shoulder extension    Shoulder abduction    Shoulder adduction    Shoulder extension    Shoulder internal rotation    Shoulder external rotation    Elbow flexion    Elbow extension    Wrist flexion    Wrist extension    Wrist ulnar deviation    Wrist radial deviation    Wrist pronation    Wrist supination     (Blank rows = not tested)  UPPER EXTREMITY  MMT:  MMT Right eval Left eval  Shoulder flexion 2- 2-  Shoulder extension    Shoulder abduction 2- 2-  Shoulder adduction    Shoulder extension    Shoulder internal rotation    Shoulder external rotation    Middle trapezius    Lower trapezius    Elbow flexion 4 4  Elbow extension 4 4  Wrist flexion 4 4  Wrist extension 4 4  Wrist ulnar deviation    Wrist radial deviation    Wrist pronation 4 4  Wrist supination 4 4  Grip strength     (Blank rows = not tested)  CERVICAL SPECIAL TESTS:  Pain with all active movement - unable to test  FUNCTIONAL TESTS:  none  TREATMENT DATE: 11/01/23      Manual Therapy:  Suboccipital release- 60 sec hold x 3  PIVM- C-spine- C3-6- with rotation mobs with rotation and side glide with sidebending x 10  STM to B posterior occiput/cervical into B levator/UT trap region x 15 min   *Treatment ended early secondary to patient had another appointment.    PATIENT EDUCATION:  Education details: Exercise technique; importance of postural training Person educated: Patient and Spouse Education method: Explanation, Demonstration, Tactile cues, and Verbal cues Education comprehension: verbalized understanding and returned demonstration  HOME EXERCISE PROGRAM: Access Code: [email or web address] URL: [email or web address] Date: 10/20/2023 Prepared by: Ferrell Hu  Exercises - Standing Shoulder Row with Anchored Resistance  - 3 x weekly - 3 sets - 10-12 reps - Standing Row with Resistance with Anchored Resistance at Chest Height Palms Down  - 3 x weekly - 3 sets - 10-12 reps - Wall Angels  - 3 x weekly - 3 sets - 10 reps    Access Code: [email or web address] URL: [email or web address] Date: 10/11/2023 Prepared by: Ferrell Hu  Exercises - Supine Chin Tuck  - 1 x daily - 3 sets - 10 reps - Supine Deep Neck Flexor Training - Repetitions  - 1 x daily - 3 sets - 10 reps - Seated Cervical Retraction  - 1 x daily - 3  sets - 10 reps  ASSESSMENT:  CLINICAL IMPRESSION: Patient is a 73 y.o. male who was seen today for physical therapy treatment for cervical dystonia and neck pain. Patient presents with less mm guarding this visit- most likely related to yesterday's botox  injections in cervical region. Treatment was modified due to acute injection and ended early due to patient having another appointment. He reported no increased muscle soreness after session.  Will continue to progress ROM and postural training as appropriate.   He will benefit from skilled PT to assist with pain relief, ROM and improved functional mobility for optimal quality of life.    OBJECTIVE IMPAIRMENTS: Abnormal gait, decreased balance, decreased ROM, decreased strength, hypomobility, impaired flexibility, impaired UE functional use,  and pain.   ACTIVITY LIMITATIONS: carrying, lifting, bending, and sleeping  PARTICIPATION LIMITATIONS: meal prep, cleaning, laundry, driving, shopping, community activity, and yard work  PERSONAL FACTORS: 1 comorbidity: anoxic brain injury  are also affecting patient's functional outcome.   REHAB POTENTIAL: Good  CLINICAL DECISION MAKING: Stable/uncomplicated  EVALUATION COMPLEXITY: Low   GOALS: Goals reviewed with patient? Yes  SHORT TERM GOALS: Target date: 11/17/2023  Pt will be independent with HEP in order to improve strength and decrease back pain in order to improve pain-free function at home and work. Baseline: EVAL- No formal HEP in place Goal status: INITIAL  LONG TERM GOALS: Target date: 12/29/2023  Pt will demonstrate decrease in NDI by at least 19% in order to demonstrate clinically significant reduction in disability related to neck injury/pain Baseline: EVAL= 46% Goal status: INITIAL  2.  Pt will decrease worst neck pain as reported on NPRS by at least 2 points in order to demonstrate clinically significant reduction in back pain Baseline: EVAL= 4/10 Goal status: INITIAL  3.   Patient will demonstrate improved cervical ROM by 15 deg each motion for improved head motion and ability to drive.  Baseline: EVAL- See cervical AROM section for values. Goal status: INITIAL   PLAN:  PT FREQUENCY: 1-2x/week  PT DURATION: 12 weeks  PLANNED INTERVENTIONS: 97164- PT Re-evaluation, 97110-Therapeutic exercises, 97530- Therapeutic activity, 97112- Neuromuscular re-education, 97535- Self Care, 16109- Manual therapy, [phone]- Electrical stimulation (manual), Patient/Family education, Taping, Dry Needling, Joint mobilization, Joint manipulation, Spinal manipulation, Spinal mobilization, Cryotherapy, and Moist heat  PLAN FOR NEXT SESSION:  Manual therapy for pain relief and ROM,Progress in education in Cervical ROM and postural strengthening and add to HEP next visit.   Murlene Army, PT 11/01/2023, 8:40 AM

## 2023-11-03 ENCOUNTER — Ambulatory Visit

## 2023-11-07 DIAGNOSIS — H43813 Vitreous degeneration, bilateral: Secondary | ICD-10-CM | POA: Diagnosis not present

## 2023-11-07 DIAGNOSIS — Z961 Presence of intraocular lens: Secondary | ICD-10-CM | POA: Diagnosis not present

## 2023-11-07 DIAGNOSIS — H04123 Dry eye syndrome of bilateral lacrimal glands: Secondary | ICD-10-CM | POA: Diagnosis not present

## 2023-11-08 ENCOUNTER — Ambulatory Visit: Attending: Neurology

## 2023-11-08 DIAGNOSIS — M6281 Muscle weakness (generalized): Secondary | ICD-10-CM | POA: Insufficient documentation

## 2023-11-08 DIAGNOSIS — M5382 Other specified dorsopathies, cervical region: Secondary | ICD-10-CM | POA: Diagnosis not present

## 2023-11-08 DIAGNOSIS — M542 Cervicalgia: Secondary | ICD-10-CM | POA: Diagnosis not present

## 2023-11-08 NOTE — Therapy (Signed)
 OUTPATIENT PHYSICAL THERAPY CERVICAL TREATMENT   Patient Name: Larry [name] MRN: [id number] DOB:September 15, 1950, 73 y.o., male Today's Date: 11/08/2023  END OF SESSION:  PT End of Session - 11/08/23 0853     Visit Number 8    Number of Visits 24    Date for PT Re-Evaluation 12/29/23    Progress Note Due on Visit 10    PT Start Time [phone]    Activity Tolerance Patient tolerated treatment well;No increased pain    Behavior During Therapy WFL for tasks assessed/performed                  Past Medical History:  Diagnosis Date   ADD (attention deficit disorder)    Allergy    seasonal flares of perinneal allergies   Cancer (HCC)    melanoma   Cardiac arrest (HCC) 07/1997   aborted   Dementia (HCC)    post resusitative   Depression    Diverticulosis    Family hx of prostate cancer    GERD (gastroesophageal reflux disease)    Headache(784.0)    Hyperlipidemia    Hypothyroidism    ICD (implantable cardiac defibrillator) in place    Idiopathic urticaria    Internal hemorrhoids    Low back pain syndrome    Melanoma in situ of skin of trunk (HCC)    chest   PVC (premature ventricular contraction)    associated with cardiac arrest   Sleep apnea    no c-pap   Tubular adenoma of colon 10/2010   Past Surgical History:  Procedure Laterality Date    implantation x2     CARDIAC DEFIBRILLATOR PLACEMENT  07/01/2010   Explanation of a previously implanted device, pocket revision, and insertion of a new device, and intraoperative defibrillation threshold testing Rodolfo Clan)   CARDIAC DEFIBRILLATOR REMOVAL  07/05/2002   Defibrillator change Rodolfo Clan) 3 times changed   CATARACT EXTRACTION W/PHACO Left 02/04/2021   Procedure: CATARACT EXTRACTION PHACO AND INTRAOCULAR LENS PLACEMENT (IOC) LEFT VIVITY toric LENS 9.21 01:11.6;  Surgeon: Annell Kidney, MD;  Location[facility];  Service: Ophthalmology;  Laterality: Left;  keep patient at 11:30 arrival   CATARACT  EXTRACTION W/PHACO Right 03/04/2021   Procedure: CATARACT EXTRACTION PHACO AND INTRAOCULAR LENS PLACEMENT (IOC) RIGHT VIVITY LENS 8.59 01:15.3;  Surgeon: Annell Kidney, MD;  Location: [facility];  Service: Ophthalmology;  Laterality: Right;   COLONOSCOPY     HEMORRHOID SURGERY     ICD     TONSILLECTOMY     Patient Active Problem List   Diagnosis Date Noted   Cervical dystonia 06/15/2022   Neck pain 07/02/2021   Gait abnormality 12/16/2020   Nonintractable headache 09/30/2020   Tinnitus 09/09/2019   History of sudden cardiac arrest 01/09/2019   Viral syndrome 09/06/2018   Seizures (HCC) 03/02/2018   Syncope 12/26/2017   Other social stressor 12/26/2017   Health care maintenance 08/14/2017   Family hx of prostate cancer    Allergy    Insomnia 10/20/2015   Advance care planning 03/08/2014   Memory loss 09/14/2013   Hypoxic brain injury (HCC) 09/14/2013   Medicare annual wellness visit, subsequent 02/16/2013   Shortness of breath 08/10/2011   Implantable cardioverter-defibrillator (ICD) in situ 02/03/2009   Hypothyroidism 11/18/2008   CONTACT DERMATITIS&OTHER ECZEMA DUE TO PLANTS 02/03/2008   IDIOPATHIC URTICARIA 02/03/2008   Hypertension 02/03/2008   HLD (hyperlipidemia) 10/27/2007   Attention deficit disorder 10/27/2007   PVC/VT non sustained 10/27/2007   Sleep apnea 10/27/2007  HEADACHE 2007/11/03   SUDDEN DEATH-aborted 11/03/07   LOW BACK PAIN SYNDROME 10/11/2007    PCP: Dr. Richrd Char  REFERRING PROVIDER: Dr. Phebe Brasil  REFERRING DIAG:  G24.3 (ICD-10-CM) - Cervical dystonia  M54.2 (ICD-10-CM) - Neck pain    THERAPY DIAG:   Rationale for Evaluation and Treatment: Rehabilitation  ONSET DATE: 2020  SUBJECTIVE:                                                                                                                                                                                                         SUBJECTIVE STATEMENT: From  Today: Patient reports doing some better- feels the exercises are helping. Received botox  to neck region and feeling okay today.   From EVAL:  Patient reports having long standing neck tightness/pain. Does receive botox  injection with some relief yet very limited motion. If I try to move my neck- it just hurts.    Hand dominance: Right  PERTINENT HISTORY:  Anoxic brain injury in 1999 due to prolonged cardiac arrest,             With residual mild cognitive impairment             Slow worsening over the years             Continue Aricept  10 mg daily, Namenda  10 mg twice a day                 Seizure             Most recent in June 2019, EEG was normal             Continue lamotrigine  100 mg twice a day   Gait abnormality, worsening neck pain, limited range of motion, abnormal neck posturing, mild anterocollis, right tilt, significant tenderness along cervical paraspinal muscles, worse on the right side              CT cervical spine in June 2023, multilevel degenerative changes, severe right-sided C1-C2 osteoarthritis, variable degree of foraminal narrowing, no significant canal stenosis             He failed epidural injection, complains of increased confusion dizziness, unsteadiness with tizanidine, tramadol treatment,             Responded well to low-dose Botox  injection,             Refer to physical therapy                EMG guided Botox  injection, used 100 units today (100 units  dissolving to 2 cc of normal saline)   Right levator scapular 25 units Right longissimus capitis 12.5 units Right semispinalis 12.5 units Right splenius capitis 12.5 units Right splenius cervix 12.5 units Right iliocostalis 12.5 units Right upper trapezius 12.5 units   He tolerated injection well, will return to clinic in 3 months for repeat injection    PAIN:  Are you having pain? Yes: NPRS scale: 4/10 neck pain R> L and upper trap and having some low back pain as well.   Pain location:  posterior neck  Pain description: ache yet sharp Aggravating factors: active head motion, lifting,  Relieving factors: rest, Injections  PRECAUTIONS: Fall  RED FLAGS: None     WEIGHT BEARING RESTRICTIONS: No  FALLS:  Has patient fallen in last 6 months? Yes. Number of falls 3-4  LIVING ENVIRONMENT: Lives with: lives with their spouse Lives in: House/apartment Stairs: No Has following equipment at home: None  OCCUPATION: not working  PLOF: Independent  PATIENT GOALS: Move my neck better for safety and improve my pain  NEXT MD VISIT: unsure  OBJECTIVE:  Note: Objective measures were completed at Evaluation unless otherwise noted.  DIAGNOSTIC FINDINGS:  CLINICAL DATA:  Chronic, progressively worsening neck pain.   EXAM: CT CERVICAL SPINE WITHOUT CONTRAST   TECHNIQUE: Multidetector CT imaging of the cervical spine was performed without intravenous contrast. Multiplanar CT image reconstructions were also generated.   RADIATION DOSE REDUCTION: This exam was performed according to the departmental dose-optimization program which includes automated exposure control, adjustment of the mA and/or kV according to patient size and/or use of iterative reconstruction technique.   COMPARISON:  Cervical spine x-rays dated July 16, 2021.   FINDINGS: Alignment: Straightening of the normal cervical lordosis. Trace anterolisthesis at C3-C4 and C7-T1.   Skull base and vertebrae: No acute fracture. Severe right-sided C1-C2 osteoarthritis. No primary bone lesion or focal pathologic process. C4 vertebral bone island extending into the left posterior elements.   Soft tissues and spinal canal: No prevertebral fluid or swelling. No visible canal hematoma.   Disc levels:   C2-C3: Small central disc protrusion. Moderate right facet arthropathy. No stenosis.   C3-C4: Small central disc protrusion. Mild bilateral uncovertebral hypertrophy. Moderate bilateral facet arthropathy.  No stenosis.   C4-C5: Small posterior disc osteophyte complex. Moderate right and mild left uncovertebral hypertrophy. Moderate right facet arthropathy. Mild right neuroforaminal stenosis. No spinal canal or left neuroforaminal stenosis.   C5-C6: Small posterior disc osteophyte complex and moderate bilateral uncovertebral hypertrophy. No stenosis.   C6-C7: Small posterior disc osteophyte complex asymmetric to the right. Moderate left and mild right uncovertebral hypertrophy. Mild left neuroforaminal stenosis. No spinal canal or right neuroforaminal stenosis.   C7-T1: Negative disc. Mild bilateral facet uncovertebral hypertrophy. No stenosis.   Upper chest: Negative.   Other: None.   IMPRESSION: 1. Multilevel degenerative changes of the cervical spine as described above. No high-grade spinal canal or neuroforaminal stenosis. 2. Severe right-sided C1-C2 osteoarthritis.     Electronically Signed   By: Aleta Anda M.D.   On: 12/28/2021 13:55    PATIENT SURVEYS:  NDI 46%  COGNITION: Overall cognitive status: History of cognitive impairments - at baseline  SENSATION: WFL  POSTURE:  Very stiff c-spine  PALPATION: (+) right sided tenderness occiput down to UT region   CERVICAL ROM:   Active ROM A/PROM (deg) eval  Flexion 25  Extension 6  Right lateral flexion 8  Left lateral flexion 12  Right rotation 25  Left rotation 28   (  Blank rows = not tested)  UPPER EXTREMITY ROM:  Active ROM Right eval Left eval  Shoulder flexion    Shoulder extension    Shoulder abduction    Shoulder adduction    Shoulder extension    Shoulder internal rotation    Shoulder external rotation    Elbow flexion    Elbow extension    Wrist flexion    Wrist extension    Wrist ulnar deviation    Wrist radial deviation    Wrist pronation    Wrist supination     (Blank rows = not tested)  UPPER EXTREMITY MMT:  MMT Right eval Left eval  Shoulder flexion 2- 2-   Shoulder extension    Shoulder abduction 2- 2-  Shoulder adduction    Shoulder extension    Shoulder internal rotation    Shoulder external rotation    Middle trapezius    Lower trapezius    Elbow flexion 4 4  Elbow extension 4 4  Wrist flexion 4 4  Wrist extension 4 4  Wrist ulnar deviation    Wrist radial deviation    Wrist pronation 4 4  Wrist supination 4 4  Grip strength     (Blank rows = not tested)  CERVICAL SPECIAL TESTS:  Pain with all active movement - unable to test  FUNCTIONAL TESTS:  none  TREATMENT DATE: 11/08/23      Manual Therapy:  Suboccipital release- 60 sec hold x 4 (emphasis on R side due to tightness)   PIVM- C-spine- C3-6- with rotation mobs with rotation and side glide with sidebending x 10  STM to B posterior occiput/cervical into B levator/UT trap region x 15 min    THEREX  Passive ROM to C- spine- Flex/ext/Rotation/SB    PATIENT EDUCATION:  Education details: Exercise technique; importance of postural training Person educated: Patient and Spouse Education method: Explanation, Demonstration, Tactile cues, and Verbal cues Education comprehension: verbalized understanding and returned demonstration  HOME EXERCISE PROGRAM: Access Code: [email or web address] URL: [email or web address] Date: 10/20/2023 Prepared by: Ferrell Hu  Exercises - Standing Shoulder Row with Anchored Resistance  - 3 x weekly - 3 sets - 10-12 reps - Standing Row with Resistance with Anchored Resistance at Chest Height Palms Down  - 3 x weekly - 3 sets - 10-12 reps - Wall Angels  - 3 x weekly - 3 sets - 10 reps    Access Code: [email or web address] URL: [email or web address] Date: 10/11/2023 Prepared by: Ferrell Hu  Exercises - Supine Chin Tuck  - 1 x daily - 3 sets - 10 reps - Supine Deep Neck Flexor Training - Repetitions  - 1 x daily - 3 sets - 10 reps - Seated Cervical Retraction  - 1 x daily - 3 sets - 10  reps  ASSESSMENT:  CLINICAL IMPRESSION: Patient is a 73 y.o. male who was seen today for physical therapy treatment for cervical dystonia and neck pain. He is presenting with improved ROM and improved pain relief post session stating "I feel pretty good and able to turn my head better." Will plan to resume more postural strengthening/awareness next visit.  He will benefit from skilled PT to assist with pain relief, ROM and improved functional mobility for optimal quality of life.    OBJECTIVE IMPAIRMENTS: Abnormal gait, decreased balance, decreased ROM, decreased strength, hypomobility, impaired flexibility, impaired UE functional use, and pain.   ACTIVITY LIMITATIONS: carrying, lifting, bending, and sleeping  PARTICIPATION LIMITATIONS: meal prep, cleaning, laundry, driving, shopping, community activity, and yard work  PERSONAL FACTORS: 1 comorbidity: anoxic brain injury  are also affecting patient's functional outcome.   REHAB POTENTIAL: Good  CLINICAL DECISION MAKING: Stable/uncomplicated  EVALUATION COMPLEXITY: Low   GOALS: Goals reviewed with patient? Yes  SHORT TERM GOALS: Target date: 11/17/2023  Pt will be independent with HEP in order to improve strength and decrease back pain in order to improve pain-free function at home and work. Baseline: EVAL- No formal HEP in place Goal status: INITIAL  LONG TERM GOALS: Target date: 12/29/2023  Pt will demonstrate decrease in NDI by at least 19% in order to demonstrate clinically significant reduction in disability related to neck injury/pain Baseline: EVAL= 46% Goal status: INITIAL  2.  Pt will decrease worst neck pain as reported on NPRS by at least 2 points in order to demonstrate clinically significant reduction in back pain Baseline: EVAL= 4/10 Goal status: INITIAL  3.  Patient will demonstrate improved cervical ROM by 15 deg each motion for improved head motion and ability to drive.  Baseline: EVAL- See cervical AROM  section for values. Goal status: INITIAL   PLAN:  PT FREQUENCY: 1-2x/week  PT DURATION: 12 weeks  PLANNED INTERVENTIONS: [id number]- PT Re-evaluation, 97110-Therapeutic exercises, 97530- Therapeutic activity, 97112- Neuromuscular re-education, 97535- Self Care, [id number]- Manual therapy, [phone]- Electrical stimulation (manual), Patient/Family education, Taping, Dry Needling, Joint mobilization, Joint manipulation, Spinal manipulation, Spinal mobilization, Cryotherapy, and Moist heat  PLAN FOR NEXT SESSION:  Manual therapy for pain relief and ROM,Progress in education in Cervical ROM and postural strengthening and add to HEP next visit.   Murlene Army, PT 11/08/2023, 8:55 AM

## 2023-11-10 ENCOUNTER — Other Ambulatory Visit: Payer: Self-pay | Admitting: Family Medicine

## 2023-11-10 ENCOUNTER — Ambulatory Visit

## 2023-11-10 DIAGNOSIS — M5382 Other specified dorsopathies, cervical region: Secondary | ICD-10-CM

## 2023-11-10 DIAGNOSIS — M542 Cervicalgia: Secondary | ICD-10-CM

## 2023-11-10 DIAGNOSIS — M6281 Muscle weakness (generalized): Secondary | ICD-10-CM

## 2023-11-10 DIAGNOSIS — Z125 Encounter for screening for malignant neoplasm of prostate: Secondary | ICD-10-CM

## 2023-11-10 DIAGNOSIS — I10 Essential (primary) hypertension: Secondary | ICD-10-CM

## 2023-11-10 DIAGNOSIS — E7849 Other hyperlipidemia: Secondary | ICD-10-CM

## 2023-11-10 DIAGNOSIS — R739 Hyperglycemia, unspecified: Secondary | ICD-10-CM

## 2023-11-10 DIAGNOSIS — E039 Hypothyroidism, unspecified: Secondary | ICD-10-CM

## 2023-11-10 NOTE — Therapy (Signed)
 OUTPATIENT PHYSICAL THERAPY CERVICAL TREATMENT   Patient Name: Larry [name] MRN: [id number] DOB:[birth year], 73 y.o., male Today's Date: 11/10/2023  END OF SESSION:  PT End of Session - 11/10/23 0810     Visit Number 9    Number of Visits 24    Date for PT Re-Evaluation 12/29/23    Progress Note Due on Visit 10    PT Start Time 0804    PT Stop Time 0845    PT Time Calculation (min) 41 min    Activity Tolerance Patient tolerated treatment well;No increased pain    Behavior During Therapy WFL for tasks assessed/performed                  Past Medical History:  Diagnosis Date   ADD (attention deficit disorder)    Allergy    seasonal flares of perinneal allergies   Cancer (HCC)    melanoma   Cardiac arrest (HCC) 07/1997   aborted   Dementia (HCC)    post resusitative   Depression    Diverticulosis    Family hx of prostate cancer    GERD (gastroesophageal reflux disease)    Headache(784.0)    Hyperlipidemia    Hypothyroidism    ICD (implantable cardiac defibrillator) in place    Idiopathic urticaria    Internal hemorrhoids    Low back pain syndrome    Melanoma in situ of skin of trunk (HCC)    chest   PVC (premature ventricular contraction)    associated with cardiac arrest   Sleep apnea    no c-pap   Tubular adenoma of colon 10/2010   Past Surgical History:  Procedure Laterality Date    implantation x2     CARDIAC DEFIBRILLATOR PLACEMENT  07/01/2010   Explanation of a previously implanted device, pocket revision, and insertion of a new device, and intraoperative defibrillation threshold testing Rodolfo Clan)   CARDIAC DEFIBRILLATOR REMOVAL  07/05/2002   Defibrillator change Rodolfo Clan) 3 times changed   CATARACT EXTRACTION W/PHACO Left 02/04/2021   Procedure: CATARACT EXTRACTION PHACO AND INTRAOCULAR LENS PLACEMENT (IOC) LEFT VIVITY toric LENS 9.21 01:11.6;  Surgeon: Annell Kidney, MD;  Location: [facility];  Service: Ophthalmology;   Laterality: Left;  keep patient at 11:30 arrival   CATARACT EXTRACTION W/PHACO Right 03/04/2021   Procedure: CATARACT EXTRACTION PHACO AND INTRAOCULAR LENS PLACEMENT (IOC) RIGHT VIVITY LENS 8.59 01:15.3;  Surgeon: Annell Kidney, MD;  Location: [facility];  Service: Ophthalmology;  Laterality: Right;   COLONOSCOPY     HEMORRHOID SURGERY     ICD     TONSILLECTOMY     Patient Active Problem List   Diagnosis Date Noted   Cervical dystonia 06/15/2022   Neck pain 07/02/2021   Gait abnormality 12/16/2020   Nonintractable headache 09/30/2020   Tinnitus 09/09/2019   History of sudden cardiac arrest 01/09/2019   Viral syndrome 09/06/2018   Seizures (HCC) 03/02/2018   Syncope 12/26/2017   Other social stressor 12/26/2017   Health care maintenance 08/14/2017   Family hx of prostate cancer    Allergy    Insomnia 10/20/2015   Advance care planning 03/08/2014   Memory loss 09/14/2013   Hypoxic brain injury (HCC) 09/14/2013   Medicare annual wellness visit, subsequent 02/16/2013   Shortness of breath 08/10/2011   Implantable cardioverter-defibrillator (ICD) in situ 02/03/2009   Hypothyroidism 11/18/2008   CONTACT DERMATITIS&OTHER ECZEMA DUE TO PLANTS 02/03/2008   IDIOPATHIC URTICARIA 02/03/2008   Hypertension 02/03/2008   HLD (hyperlipidemia) 10/27/2007  Attention deficit disorder 10/28/2007   PVC/VT non sustained 28-Oct-2007   Sleep apnea 2007-10-28   HEADACHE 10/28/2007   SUDDEN DEATH-aborted 10-28-2007   LOW BACK PAIN SYNDROME 10/11/2007    PCP: Dr. Richrd Char  REFERRING PROVIDER: Dr. Phebe Brasil  REFERRING DIAG:  G24.3 (ICD-10-CM) - Cervical dystonia  M54.2 (ICD-10-CM) - Neck pain    THERAPY DIAG:   Rationale for Evaluation and Treatment: Rehabilitation  ONSET DATE: 2020  SUBJECTIVE:                                                                                                                                                                                                          SUBJECTIVE STATEMENT: From Today: Patient reports compliance with HEP and no issues today.  From EVAL:  Patient reports having long standing neck tightness/pain. Does receive botox  injection with some relief yet very limited motion. If I try to move my neck- it just hurts.    Hand dominance: Right  PERTINENT HISTORY:  Anoxic brain injury in 1999 due to prolonged cardiac arrest,             With residual mild cognitive impairment             Slow worsening over the years             Continue Aricept  10 mg daily, Namenda  10 mg twice a day                 Seizure             Most recent in June 2019, EEG was normal             Continue lamotrigine  100 mg twice a day   Gait abnormality, worsening neck pain, limited range of motion, abnormal neck posturing, mild anterocollis, right tilt, significant tenderness along cervical paraspinal muscles, worse on the right side              CT cervical spine in June 2023, multilevel degenerative changes, severe right-sided C1-C2 osteoarthritis, variable degree of foraminal narrowing, no significant canal stenosis             He failed epidural injection, complains of increased confusion dizziness, unsteadiness with tizanidine, tramadol treatment,             Responded well to low-dose Botox  injection,             Refer to physical therapy                EMG guided Botox  injection,  used 100 units today (100 units dissolving to 2 cc of normal saline)   Right levator scapular 25 units Right longissimus capitis 12.5 units Right semispinalis 12.5 units Right splenius capitis 12.5 units Right splenius cervix 12.5 units Right iliocostalis 12.5 units Right upper trapezius 12.5 units   He tolerated injection well, will return to clinic in 3 months for repeat injection    PAIN:  Are you having pain? Yes: NPRS scale: 4/10 neck pain R> L and upper trap and having some low back pain as well.   Pain location: posterior neck   Pain description: ache yet sharp Aggravating factors: active head motion, lifting,  Relieving factors: rest, Injections  PRECAUTIONS: Fall  RED FLAGS: None     WEIGHT BEARING RESTRICTIONS: No  FALLS:  Has patient fallen in last 6 months? Yes. Number of falls 3-4  LIVING ENVIRONMENT: Lives with: lives with their spouse Lives in: House/apartment Stairs: No Has following equipment at home: None  OCCUPATION: not working  PLOF: Independent  PATIENT GOALS: Move my neck better for safety and improve my pain  NEXT MD VISIT: unsure  OBJECTIVE:  Note: Objective measures were completed at Evaluation unless otherwise noted.  DIAGNOSTIC FINDINGS:  CLINICAL DATA:  Chronic, progressively worsening neck pain.   EXAM: CT CERVICAL SPINE WITHOUT CONTRAST   TECHNIQUE: Multidetector CT imaging of the cervical spine was performed without intravenous contrast. Multiplanar CT image reconstructions were also generated.   RADIATION DOSE REDUCTION: This exam was performed according to the departmental dose-optimization program which includes automated exposure control, adjustment of the mA and/or kV according to patient size and/or use of iterative reconstruction technique.   COMPARISON:  Cervical spine x-rays dated July 16, 2021.   FINDINGS: Alignment: Straightening of the normal cervical lordosis. Trace anterolisthesis at C3-C4 and C7-T1.   Skull base and vertebrae: No acute fracture. Severe right-sided C1-C2 osteoarthritis. No primary bone lesion or focal pathologic process. C4 vertebral bone island extending into the left posterior elements.   Soft tissues and spinal canal: No prevertebral fluid or swelling. No visible canal hematoma.   Disc levels:   C2-C3: Small central disc protrusion. Moderate right facet arthropathy. No stenosis.   C3-C4: Small central disc protrusion. Mild bilateral uncovertebral hypertrophy. Moderate bilateral facet arthropathy. No stenosis.    C4-C5: Small posterior disc osteophyte complex. Moderate right and mild left uncovertebral hypertrophy. Moderate right facet arthropathy. Mild right neuroforaminal stenosis. No spinal canal or left neuroforaminal stenosis.   C5-C6: Small posterior disc osteophyte complex and moderate bilateral uncovertebral hypertrophy. No stenosis.   C6-C7: Small posterior disc osteophyte complex asymmetric to the right. Moderate left and mild right uncovertebral hypertrophy. Mild left neuroforaminal stenosis. No spinal canal or right neuroforaminal stenosis.   C7-T1: Negative disc. Mild bilateral facet uncovertebral hypertrophy. No stenosis.   Upper chest: Negative.   Other: None.   IMPRESSION: 1. Multilevel degenerative changes of the cervical spine as described above. No high-grade spinal canal or neuroforaminal stenosis. 2. Severe right-sided C1-C2 osteoarthritis.     Electronically Signed   By: Aleta Anda M.D.   On: 12/28/2021 13:55    PATIENT SURVEYS:  NDI 46%  COGNITION: Overall cognitive status: History of cognitive impairments - at baseline  SENSATION: WFL  POSTURE: Very stiff c-spine  PALPATION: (+) right sided tenderness occiput down to UT region   CERVICAL ROM:   Active ROM A/PROM (deg) eval  Flexion 25  Extension 6  Right lateral flexion 8  Left lateral flexion 12  Right rotation  25  Left rotation 28   (Blank rows = not tested)  UPPER EXTREMITY ROM:  Active ROM Right eval Left eval  Shoulder flexion    Shoulder extension    Shoulder abduction    Shoulder adduction    Shoulder extension    Shoulder internal rotation    Shoulder external rotation    Elbow flexion    Elbow extension    Wrist flexion    Wrist extension    Wrist ulnar deviation    Wrist radial deviation    Wrist pronation    Wrist supination     (Blank rows = not tested)  UPPER EXTREMITY MMT:  MMT Right eval Left eval  Shoulder flexion 2- 2-  Shoulder extension     Shoulder abduction 2- 2-  Shoulder adduction    Shoulder extension    Shoulder internal rotation    Shoulder external rotation    Middle trapezius    Lower trapezius    Elbow flexion 4 4  Elbow extension 4 4  Wrist flexion 4 4  Wrist extension 4 4  Wrist ulnar deviation    Wrist radial deviation    Wrist pronation 4 4  Wrist supination 4 4  Grip strength     (Blank rows = not tested)  CERVICAL SPECIAL TESTS:  Pain with all active movement - unable to test  FUNCTIONAL TESTS:  none  TREATMENT DATE: 11/10/23      THEREX: AROM: Cervical Rotation 2x10 AROM: Cervical SB 2 x 10 AROM: Cervical Flex/Ext 2 x 10    NMR: for postural awareness Chin tuck- hold 5 sec 2 sets of 10 reps  Active Scap retraction 2 x 10 reps Resistive scap retraction 17.5 # 2 x 10 reps Resistive sh ER 2.5 on left 2 x 10 and 7.5# on right 2x10 Posterior shoulder rolls 3 x 10  Wall posture stretch hold 60 sec x 2 Wall chin tuck x 10 reps (3-5 sec hold)  Wall angels x 10 reps     PATIENT EDUCATION:  Education details: Exercise technique; importance of postural training Person educated: Patient and Spouse Education method: Explanation, Demonstration, Tactile cues, and Verbal cues Education comprehension: verbalized understanding and returned demonstration  HOME EXERCISE PROGRAM: Access Code: [email or web address] URL: [email or web address] Date: 10/20/2023 Prepared by: Ferrell Hu  Exercises - Standing Shoulder Row with Anchored Resistance  - 3 x weekly - 3 sets - 10-12 reps - Standing Row with Resistance with Anchored Resistance at Chest Height Palms Down  - 3 x weekly - 3 sets - 10-12 reps - Wall Angels  - 3 x weekly - 3 sets - 10 reps    Access Code: [email or web address] URL: [email or web address] Date: 10/11/2023 Prepared by: Ferrell Hu  Exercises - Supine Chin Tuck  - 1 x daily - 3 sets - 10 reps - Supine Deep Neck Flexor Training - Repetitions  - 1 x daily  - 3 sets - 10 reps - Seated Cervical Retraction  - 1 x daily - 3 sets - 10 reps  ASSESSMENT:  CLINICAL IMPRESSION: Patient is a 73 y.o. male who was seen today for physical therapy treatment for cervical dystonia and neck pain. Patient was able to progress to more postural strengthening and responded well- no increased report of pain. He was able to advance to more active and resistive strengthening and only min VC and visual cues for correct technique.   He will benefit from skilled PT to assist with pain relief, ROM and improved functional mobility  for optimal quality of life.    OBJECTIVE IMPAIRMENTS: Abnormal gait, decreased balance, decreased ROM, decreased strength, hypomobility, impaired flexibility, impaired UE functional use, and pain.   ACTIVITY LIMITATIONS: carrying, lifting, bending, and sleeping  PARTICIPATION LIMITATIONS: meal prep, cleaning, laundry, driving, shopping, community activity, and yard work  PERSONAL FACTORS: 1 comorbidity: anoxic brain injury are also affecting patient's functional outcome.   REHAB POTENTIAL: Good  CLINICAL DECISION MAKING: Stable/uncomplicated  EVALUATION COMPLEXITY: Low   GOALS: Goals reviewed with patient? Yes  SHORT TERM GOALS: Target date: 11/17/2023  Pt will be independent with HEP in order to improve strength and decrease back pain in order to improve pain-free function at home and work. Baseline: EVAL- No formal HEP in place Goal status: INITIAL  LONG TERM GOALS: Target date: 12/29/2023  Pt will demonstrate decrease in NDI by at least 19% in order to demonstrate clinically significant reduction in disability related to neck injury/pain Baseline: EVAL= 46% Goal status: INITIAL  2.  Pt will decrease worst neck pain as reported on NPRS by at least 2 points in order to demonstrate clinically significant reduction in back pain Baseline: EVAL= 4/10 Goal status: INITIAL  3.  Patient will demonstrate improved cervical ROM by 15 deg  each motion for improved head motion and ability to drive.  Baseline: EVAL- See cervical AROM section for values. Goal status: INITIAL   PLAN:  PT FREQUENCY: 1-2x/week  PT DURATION: 12 weeks  PLANNED INTERVENTIONS: 97164- PT Re-evaluation, 97110-Therapeutic exercises, 97530- Therapeutic activity, 97112- Neuromuscular re-education, 97535- Self Care, [id number]- Manual therapy, [phone]- Electrical stimulation (manual), Patient/Family education, Taping, Dry Needling, Joint mobilization, Joint manipulation, Spinal manipulation, Spinal mobilization, Cryotherapy, and Moist heat  PLAN FOR NEXT SESSION:  Manual therapy for pain relief and ROM,Progress in education in Cervical ROM and postural strengthening and add to HEP next visit.  Progress report due next visit  Murlene Army, PT 11/10/2023, 8:50 AM

## 2023-11-11 ENCOUNTER — Ambulatory Visit (INDEPENDENT_AMBULATORY_CARE_PROVIDER_SITE_OTHER): Admitting: Family Medicine

## 2023-11-11 ENCOUNTER — Encounter: Payer: Self-pay | Admitting: Family Medicine

## 2023-11-11 VITALS — BP 124/68 | HR 76 | Temp 98.6°F | Ht 68.0 in | Wt 179.4 lb

## 2023-11-11 DIAGNOSIS — R269 Unspecified abnormalities of gait and mobility: Secondary | ICD-10-CM

## 2023-11-11 DIAGNOSIS — Z125 Encounter for screening for malignant neoplasm of prostate: Secondary | ICD-10-CM

## 2023-11-11 DIAGNOSIS — R739 Hyperglycemia, unspecified: Secondary | ICD-10-CM | POA: Diagnosis not present

## 2023-11-11 DIAGNOSIS — I10 Essential (primary) hypertension: Secondary | ICD-10-CM

## 2023-11-11 DIAGNOSIS — M549 Dorsalgia, unspecified: Secondary | ICD-10-CM | POA: Diagnosis not present

## 2023-11-11 DIAGNOSIS — E039 Hypothyroidism, unspecified: Secondary | ICD-10-CM | POA: Diagnosis not present

## 2023-11-11 DIAGNOSIS — E7849 Other hyperlipidemia: Secondary | ICD-10-CM

## 2023-11-11 DIAGNOSIS — M542 Cervicalgia: Secondary | ICD-10-CM

## 2023-11-11 DIAGNOSIS — Z9181 History of falling: Secondary | ICD-10-CM | POA: Diagnosis not present

## 2023-11-11 DIAGNOSIS — Z7189 Other specified counseling: Secondary | ICD-10-CM

## 2023-11-11 DIAGNOSIS — R413 Other amnesia: Secondary | ICD-10-CM

## 2023-11-11 DIAGNOSIS — Z0000 Encounter for general adult medical examination without abnormal findings: Secondary | ICD-10-CM

## 2023-11-11 MED ORDER — MONTELUKAST SODIUM 10 MG PO TABS: 10.0000 mg | ORAL_TABLET | Freq: Every day | ORAL | 3 refills | Status: AC

## 2023-11-11 MED ORDER — AMPHETAMINE-DEXTROAMPHETAMINE 10 MG PO TABS: 10.0000 mg | ORAL_TABLET | Freq: Two times a day (BID) | ORAL | 0 refills | Status: DC

## 2023-11-11 MED ORDER — ROSUVASTATIN CALCIUM 20 MG PO TABS: 20.0000 mg | ORAL_TABLET | Freq: Every day | ORAL | 3 refills | Status: AC

## 2023-11-11 MED ORDER — LEVOTHYROXINE SODIUM 137 MCG PO TABS: 137.0000 ug | ORAL_TABLET | Freq: Every day | ORAL | 3 refills | Status: DC

## 2023-11-11 MED ORDER — AMPHETAMINE-DEXTROAMPHETAMINE 10 MG PO TABS: 10.0000 mg | ORAL_TABLET | Freq: Two times a day (BID) | ORAL | Status: DC

## 2023-11-11 MED ORDER — METOPROLOL SUCCINATE ER 25 MG PO TB24: 25.0000 mg | ORAL_TABLET | Freq: Every day | ORAL | 3 refills | Status: AC

## 2023-11-11 NOTE — Patient Instructions (Addendum)
 Ask about add on PT for gait and back pain.  Ask the pharmacy about a Tdap/tetanus shot.  I would get a flu shot each fall.

## 2023-11-11 NOTE — Progress Notes (Unsigned)
 Hypertension:               Using medication without problems or lightheadedness: yes Chest pain with exertion:no Edema:no Short of breath:no He had some elevated BP readings at other clinics.  BP improved today.    History of hyperglycemia.  See notes on labs.  Has been in PT for his neck.  He is still doing HEP.     Elevated Cholesterol: Using medications without problems:yes Muscle aches: no Diet compliance: d/w pt.   Exercise: d/w pt.  Affected by his neck.    He had spine clinic re: back pain after a fall.  D/w pt about PT for his balance.     Hypothyroidism.  TSH pending.  No dysphagia.  Compliant.     Flu prev done.  PNA up to date.   Tetanus 2010, d/w pt.   shingrix prev done.  covid vaccine prev done.   PSA pending 2025.   Colonoscopy 2024.   Advance directive d/w pt. Wife designated if patient were incapacitated AAA screening wnl 2024.    He has routine dermatology f/u.     Memory/concentration d/w pt.  Still on namenda , donepezil , adderall.  Noted by patient and wife that his memory is gradually worse, with short term changes.  Repeating some questions.  No MVA.  He is still playing cards, singing, and trying to be mentally active.  Driving cautions discussed with patient.   Meds, vitals, and allergies reviewed.   ROS: Per HPI unless specifically indicated in ROS section   GEN: nad, alert and oriented HEENT: ncat NECK: supple w/o LA CV: rrr PULM: ctab, no inc wob ABD: soft, +bs EXT: no edema SKIN: no acute rash Lower back sore on standing.  Gait is symmetric at time of office visit without obvious limp, but this is a noted improvement compared to recent days per patient and wife's report.

## 2023-11-12 LAB — CBC WITH DIFFERENTIAL/PLATELET
Absolute Lymphocytes: 3443 {cells}/uL (ref 850–3900)
Absolute Monocytes: 850 {cells}/uL (ref 200–950)
Basophils Absolute: 60 {cells}/uL (ref 0–200)
Basophils Relative: 0.7 %
Eosinophils Absolute: 349 {cells}/uL (ref 15–500)
Eosinophils Relative: 4.1 %
HCT: 38.6 % (ref 38.5–50.0)
Hemoglobin: 13.2 g/dL (ref 13.2–17.1)
MCH: 31.4 pg (ref 27.0–33.0)
MCHC: 34.2 g/dL (ref 32.0–36.0)
MCV: 91.9 fL (ref 80.0–100.0)
MPV: 10.5 fL (ref 7.5–12.5)
Monocytes Relative: 10 %
Neutro Abs: 3800 {cells}/uL (ref 1500–7800)
Neutrophils Relative %: 44.7 %
Platelets: 344 10*3/uL (ref 140–400)
RBC: 4.2 10*6/uL (ref 4.20–5.80)
RDW: 11.9 % (ref 11.0–15.0)
Total Lymphocyte: 40.5 %
WBC: 8.5 10*3/uL (ref 3.8–10.8)

## 2023-11-12 LAB — COMPREHENSIVE METABOLIC PANEL WITH GFR
AG Ratio: 1.8 (calc) (ref 1.0–2.5)
ALT: 23 U/L (ref 9–46)
AST: 24 U/L (ref 10–35)
Albumin: 4.4 g/dL (ref 3.6–5.1)
Alkaline phosphatase (APISO): 73 U/L (ref 35–144)
BUN: 12 mg/dL (ref 7–25)
CO2: 28 mmol/L (ref 20–32)
Calcium: 9.3 mg/dL (ref 8.6–10.3)
Chloride: 102 mmol/L (ref 98–110)
Creat: 0.96 mg/dL (ref 0.70–1.28)
Globulin: 2.4 g/dL (ref 1.9–3.7)
Glucose, Bld: 91 mg/dL (ref 65–99)
Potassium: 4.6 mmol/L (ref 3.5–5.3)
Sodium: 138 mmol/L (ref 135–146)
Total Bilirubin: 0.3 mg/dL (ref 0.2–1.2)
Total Protein: 6.8 g/dL (ref 6.1–8.1)
eGFR: 85 mL/min/{1.73_m2} (ref >=60)

## 2023-11-12 LAB — LIPID PANEL
Cholesterol: 188 mg/dL (ref <200)
HDL: 59 mg/dL (ref >=40)
LDL Cholesterol (Calc): 99 mg/dL
Non-HDL Cholesterol (Calc): 129 mg/dL (ref <130)
Total CHOL/HDL Ratio: 3.2 (calc) (ref <5.0)
Triglycerides: 201 mg/dL — ABNORMAL HIGH (ref <150)

## 2023-11-12 LAB — PSA: PSA: 0.2 ng/mL (ref <=4.00)

## 2023-11-12 LAB — TSH: TSH: 5.47 m[IU]/L — ABNORMAL HIGH (ref 0.40–4.50)

## 2023-11-13 ENCOUNTER — Encounter: Payer: Self-pay | Admitting: Family Medicine

## 2023-11-13 ENCOUNTER — Other Ambulatory Visit: Payer: Self-pay | Admitting: Family Medicine

## 2023-11-13 DIAGNOSIS — E039 Hypothyroidism, unspecified: Secondary | ICD-10-CM

## 2023-11-13 DIAGNOSIS — M549 Dorsalgia, unspecified: Secondary | ICD-10-CM | POA: Insufficient documentation

## 2023-11-13 MED ORDER — LEVOTHYROXINE SODIUM 150 MCG PO TABS: 150.0000 ug | ORAL_TABLET | Freq: Every day | ORAL | 3 refills | Status: DC

## 2023-11-13 NOTE — Assessment & Plan Note (Signed)
 Refer to PT

## 2023-11-13 NOTE — Assessment & Plan Note (Signed)
Continue Crestor.  See notes on labs. 

## 2023-11-13 NOTE — Assessment & Plan Note (Signed)
Refer to PT for balance training.

## 2023-11-13 NOTE — Assessment & Plan Note (Signed)
 Flu prev done.  PNA up to date.   Tetanus 2010, d/w pt.   shingrix prev done.  covid vaccine prev done.   PSA pending 2025.   Colonoscopy 2024.   Advance directive d/w pt. Wife designated if patient were incapacitated AAA screening wnl 2024.

## 2023-11-13 NOTE — Assessment & Plan Note (Signed)
 Gradually worse. No MVA.  He is still playing cards, singing, and trying to be mentally active.  Driving cautions discussed with patient. Continue Namenda  donepezil  and Adderall at this point.

## 2023-11-13 NOTE — Assessment & Plan Note (Signed)
Advance directive d/w pt.  Wife designated if patient were incapacitated.   

## 2023-11-13 NOTE — Assessment & Plan Note (Signed)
 He had some elevated BP readings at other clinics.  BP improved today.  Continue diltiazem  and metoprolol .

## 2023-11-13 NOTE — Assessment & Plan Note (Signed)
 Continue PT for neck

## 2023-11-13 NOTE — Assessment & Plan Note (Signed)
Continue levothyroxine.  See notes on labs. 

## 2023-11-15 ENCOUNTER — Ambulatory Visit

## 2023-11-15 DIAGNOSIS — M5382 Other specified dorsopathies, cervical region: Secondary | ICD-10-CM | POA: Diagnosis not present

## 2023-11-15 DIAGNOSIS — M542 Cervicalgia: Secondary | ICD-10-CM

## 2023-11-15 DIAGNOSIS — M6281 Muscle weakness (generalized): Secondary | ICD-10-CM

## 2023-11-15 NOTE — Therapy (Signed)
 OUTPATIENT PHYSICAL THERAPY CERVICAL TREATMENT/Physical Therapy Progress Note   Dates of reporting period  10/06/2023   to   11/15/2023    Patient Name: Larry [name] MRN: [id number] DOB:1951-05-02, 73 y.o., male Today's Date: 11/15/2023  END OF SESSION:  PT End of Session - 11/15/23 0928     Visit Number 10    Number of Visits 24    Date for PT Re-Evaluation 12/29/23    Progress Note Due on Visit 10    PT Start Time 0930    PT Stop Time 1014    PT Time Calculation (min) 44 min    Activity Tolerance Patient tolerated treatment well;No increased pain    Behavior During Therapy WFL for tasks assessed/performed                  Past Medical History:  Diagnosis Date   ADD (attention deficit disorder)    Allergy    seasonal flares of perinneal allergies   Cancer (HCC)    melanoma   Cardiac arrest (HCC) 07/1997   aborted   Dementia (HCC)    post resusitative   Depression    Diverticulosis    Family hx of prostate cancer    GERD (gastroesophageal reflux disease)    Headache(784.0)    Hyperlipidemia    Hypothyroidism    ICD (implantable cardiac defibrillator) in place    Idiopathic urticaria    Internal hemorrhoids    Low back pain syndrome    Melanoma in situ of skin of trunk (HCC)    chest   PVC (premature ventricular contraction)    associated with cardiac arrest   Sleep apnea    no c-pap   Tubular adenoma of colon 10/2010   Past Surgical History:  Procedure Laterality Date    implantation x2     CARDIAC DEFIBRILLATOR PLACEMENT  07/01/2010   Explanation of a previously implanted device, pocket revision, and insertion of a new device, and intraoperative defibrillation threshold testing Rodolfo Clan)   CARDIAC DEFIBRILLATOR REMOVAL  07/05/2002   Defibrillator change Rodolfo Clan) 3 times changed   CATARACT EXTRACTION W/PHACO Left 02/04/2021   Procedure: CATARACT EXTRACTION PHACO AND INTRAOCULAR LENS PLACEMENT (IOC) LEFT VIVITY toric LENS 9.21 01:11.6;  Surgeon:  Annell Kidney, MD;  Location: [facility];  Service: Ophthalmology;  Laterality: Left;  keep patient at 11:30 arrival   CATARACT EXTRACTION W/PHACO Right 03/04/2021   Procedure: CATARACT EXTRACTION PHACO AND INTRAOCULAR LENS PLACEMENT (IOC) RIGHT VIVITY LENS 8.59 01:15.3;  Surgeon: Annell Kidney, MD;  Location: [facility];  Service: Ophthalmology;  Laterality: Right;   COLONOSCOPY     HEMORRHOID SURGERY     ICD     TONSILLECTOMY     Patient Active Problem List   Diagnosis Date Noted   Back pain 11/13/2023   Cervical dystonia 06/15/2022   Neck pain 07/02/2021   Gait abnormality 12/16/2020   Nonintractable headache 09/30/2020   Tinnitus 09/09/2019   History of sudden cardiac arrest 01/09/2019   Viral syndrome 09/06/2018   Seizures (HCC) 03/02/2018   Syncope 12/26/2017   Other social stressor 12/26/2017   Health care maintenance 08/14/2017   Family hx of prostate cancer    Allergy    Insomnia 10/20/2015   Advance care planning 03/08/2014   Memory loss 09/14/2013   Hypoxic brain injury (HCC) 09/14/2013   Medicare annual wellness visit, subsequent 02/16/2013   Shortness of breath 08/10/2011   Implantable cardioverter-defibrillator (ICD) in situ 02/03/2009   Hypothyroidism 11/18/2008  CONTACT DERMATITIS&OTHER ECZEMA DUE TO PLANTS 02/03/2008   IDIOPATHIC URTICARIA 02/03/2008   Hypertension 02/03/2008   HLD (hyperlipidemia) 02-Nov-2007   Attention deficit disorder 11-02-2007   PVC/VT non sustained 11-02-2007   Sleep apnea November 02, 2007   HEADACHE 02-Nov-2007   SUDDEN DEATH-aborted 2007-11-02   LOW BACK PAIN SYNDROME 10/11/2007    PCP: Dr. Richrd Char  REFERRING PROVIDER: Dr. Phebe Brasil  REFERRING DIAG:  G24.3 (ICD-10-CM) - Cervical dystonia  M54.2 (ICD-10-CM) - Neck pain    THERAPY DIAG:   Rationale for Evaluation and Treatment: Rehabilitation  ONSET DATE: 2020  SUBJECTIVE:                                                                                                                                                                                                          SUBJECTIVE STATEMENT: From Today: Patient reports compliance with HEP and no issues today.  From EVAL:  Patient reports having long standing neck tightness/pain. Does receive botox  injection with some relief yet very limited motion. If I try to move my neck- it just hurts.    Hand dominance: Right  PERTINENT HISTORY:  Anoxic brain injury in 1999 due to prolonged cardiac arrest,             With residual mild cognitive impairment             Slow worsening over the years             Continue Aricept  10 mg daily, Namenda  10 mg twice a day                 Seizure             Most recent in June 2019, EEG was normal             Continue lamotrigine  100 mg twice a day   Gait abnormality, worsening neck pain, limited range of motion, abnormal neck posturing, mild anterocollis, right tilt, significant tenderness along cervical paraspinal muscles, worse on the right side              CT cervical spine in June 2023, multilevel degenerative changes, severe right-sided C1-C2 osteoarthritis, variable degree of foraminal narrowing, no significant canal stenosis             He failed epidural injection, complains of increased confusion dizziness, unsteadiness with tizanidine, tramadol treatment,             Responded well to low-dose Botox  injection,  Refer to physical therapy                EMG guided Botox  injection, used 100 units today (100 units dissolving to 2 cc of normal saline)   Right levator scapular 25 units Right longissimus capitis 12.5 units Right semispinalis 12.5 units Right splenius capitis 12.5 units Right splenius cervix 12.5 units Right iliocostalis 12.5 units Right upper trapezius 12.5 units   He tolerated injection well, will return to clinic in 3 months for repeat injection    PAIN:  Are you having pain? Yes: NPRS scale:  4/10 neck pain R> L and upper trap and having some low back pain as well.   Pain location: posterior neck  Pain description: ache yet sharp Aggravating factors: active head motion, lifting,  Relieving factors: rest, Injections  PRECAUTIONS: Fall  RED FLAGS: None     WEIGHT BEARING RESTRICTIONS: No  FALLS:  Has patient fallen in last 6 months? Yes. Number of falls 3-4  LIVING ENVIRONMENT: Lives with: lives with their spouse Lives in: House/apartment Stairs: No Has following equipment at home: None  OCCUPATION: not working  PLOF: Independent  PATIENT GOALS: Move my neck better for safety and improve my pain  NEXT MD VISIT: unsure  OBJECTIVE:  Note: Objective measures were completed at Evaluation unless otherwise noted.  DIAGNOSTIC FINDINGS:  CLINICAL DATA:  Chronic, progressively worsening neck pain.   EXAM: CT CERVICAL SPINE WITHOUT CONTRAST   TECHNIQUE: Multidetector CT imaging of the cervical spine was performed without intravenous contrast. Multiplanar CT image reconstructions were also generated.   RADIATION DOSE REDUCTION: This exam was performed according to the departmental dose-optimization program which includes automated exposure control, adjustment of the mA and/or kV according to patient size and/or use of iterative reconstruction technique.   COMPARISON:  Cervical spine x-rays dated July 16, 2021.   FINDINGS: Alignment: Straightening of the normal cervical lordosis. Trace anterolisthesis at C3-C4 and C7-T1.   Skull base and vertebrae: No acute fracture. Severe right-sided C1-C2 osteoarthritis. No primary bone lesion or focal pathologic process. C4 vertebral bone island extending into the left posterior elements.   Soft tissues and spinal canal: No prevertebral fluid or swelling. No visible canal hematoma.   Disc levels:   C2-C3: Small central disc protrusion. Moderate right facet arthropathy. No stenosis.   C3-C4: Small central disc  protrusion. Mild bilateral uncovertebral hypertrophy. Moderate bilateral facet arthropathy. No stenosis.   C4-C5: Small posterior disc osteophyte complex. Moderate right and mild left uncovertebral hypertrophy. Moderate right facet arthropathy. Mild right neuroforaminal stenosis. No spinal canal or left neuroforaminal stenosis.   C5-C6: Small posterior disc osteophyte complex and moderate bilateral uncovertebral hypertrophy. No stenosis.   C6-C7: Small posterior disc osteophyte complex asymmetric to the right. Moderate left and mild right uncovertebral hypertrophy. Mild left neuroforaminal stenosis. No spinal canal or right neuroforaminal stenosis.   C7-T1: Negative disc. Mild bilateral facet uncovertebral hypertrophy. No stenosis.   Upper chest: Negative.   Other: None.   IMPRESSION: 1. Multilevel degenerative changes of the cervical spine as described above. No high-grade spinal canal or neuroforaminal stenosis. 2. Severe right-sided C1-C2 osteoarthritis.     Electronically Signed   By: Aleta Anda M.D.   On: 12/28/2021 13:55    PATIENT SURVEYS:  NDI 46%  COGNITION: Overall cognitive status: History of cognitive impairments - at baseline  SENSATION: WFL  POSTURE: Very stiff c-spine  PALPATION: (+) right sided tenderness occiput down to UT region   CERVICAL ROM:   Active  ROM A/PROM (deg) eval AROM 11/15/2023  Flexion 25   Extension 6   Right lateral flexion 8 18  Left lateral flexion 12 14  Right rotation 25 28  Left rotation 28 28   (Blank rows = not tested)  UPPER EXTREMITY ROM:  Active ROM Right eval Left eval  Shoulder flexion    Shoulder extension    Shoulder abduction    Shoulder adduction    Shoulder extension    Shoulder internal rotation    Shoulder external rotation    Elbow flexion    Elbow extension    Wrist flexion    Wrist extension    Wrist ulnar deviation    Wrist radial deviation    Wrist pronation    Wrist  supination     (Blank rows = not tested)  UPPER EXTREMITY MMT:  MMT Right eval Left eval  Shoulder flexion 2- 2-  Shoulder extension    Shoulder abduction 2- 2-  Shoulder adduction    Shoulder extension    Shoulder internal rotation    Shoulder external rotation    Middle trapezius    Lower trapezius    Elbow flexion 4 4  Elbow extension 4 4  Wrist flexion 4 4  Wrist extension 4 4  Wrist ulnar deviation    Wrist radial deviation    Wrist pronation 4 4  Wrist supination 4 4  Grip strength     (Blank rows = not tested)  CERVICAL SPECIAL TESTS:  Pain with all active movement - unable to test  FUNCTIONAL TESTS:  none  TREATMENT DATE: 11/15/23   Physical therapy treatment session today consisted of completing assessment of goals and administration of testing as demonstrated and documented in flow sheet, treatment, and goals section of this note. Addition treatments may be found below.    THEREX: AROM: Cervical Rotation 2x10 AROM: Cervical SB 2 x 10 AROM: Cervical Flex/Ext 2 x 10  PROM to cervical Rotation and SB   NMR: for postural awareness Chin tuck- hold 5 sec 2 sets of 10 reps  Active Scap retraction 2 x 10 reps Posterior shoulder rolls 3 x 10  Wall posture stretch hold 60 sec x 2 Wall chin tuck x 10 reps (3-5 sec hold)  Wall angels 2 x 10 reps     PATIENT EDUCATION:  Education details: Exercise technique; importance of postural training Person educated: Patient and Spouse Education method: Explanation, Demonstration, Tactile cues, and Verbal cues Education comprehension: verbalized understanding and returned demonstration  HOME EXERCISE PROGRAM: Access Code: [email or web address] URL: [email or web address] Date: 10/20/2023 Prepared by: Ferrell Hu  Exercises - Standing Shoulder Row with Anchored Resistance  - 3 x weekly - 3 sets - 10-12 reps - Standing Row with Resistance with Anchored Resistance at Chest Height Palms Down  - 3 x weekly -  3 sets - 10-12 reps - Wall Angels  - 3 x weekly - 3 sets - 10 reps    Access Code: [email or web address] URL: [email or web address] Date: 10/11/2023 Prepared by: Ferrell Hu  Exercises - Supine Chin Tuck  - 1 x daily - 3 sets - 10 reps - Supine Deep Neck Flexor Training - Repetitions  - 1 x daily - 3 sets - 10 reps - Seated Cervical Retraction  - 1 x daily - 3 sets - 10 reps  ASSESSMENT:  CLINICAL IMPRESSION: Patient is a 73 y.o. male who was seen today for physical therapy treatment for cervical dystonia and neck pain. Progress report reveals pain is more  today than eval but patient reports today is just a bad day and the majority of the time he is at a 4/10 or less. He presents with continued stiffness in neck- only slightly improved today vs. Initial evaluation. He reports compliant with HEP and maybe today is "Just a bad day" per patient which is why is values did not improve more. May warrant retesting again next visit if he is feeling better for more accurate portrayal of progress to date overall. Patient's condition has the potential to improve in response to therapy. Maximum improvement is yet to be obtained. The anticipated improvement is attainable and reasonable in a generally predictable time. He will benefit from skilled PT to assist with pain relief, ROM and improved functional mobility for optimal quality of life.    OBJECTIVE IMPAIRMENTS: Abnormal gait, decreased balance, decreased ROM, decreased strength, hypomobility, impaired flexibility, impaired UE functional use, and pain.   ACTIVITY LIMITATIONS: carrying, lifting, bending, and sleeping  PARTICIPATION LIMITATIONS: meal prep, cleaning, laundry, driving, shopping, community activity, and yard work  PERSONAL FACTORS: 1 comorbidity: anoxic brain injury are also affecting patient's functional outcome.   REHAB POTENTIAL: Good  CLINICAL DECISION MAKING: Stable/uncomplicated  EVALUATION COMPLEXITY:  Low   GOALS: Goals reviewed with patient? Yes  SHORT TERM GOALS: Target date: 11/17/2023  Pt will be independent with HEP in order to improve strength and decrease back pain in order to improve pain-free function at home and work. Baseline: EVAL- No formal HEP in place; 11/15/2023= Patient reports more compliant with current HEP- and using heat prior to exercises.   Goal status: MET  LONG TERM GOALS: Target date: 12/29/2023  Pt will demonstrate decrease in NDI by at least 19% in order to demonstrate clinically significant reduction in disability related to neck injury/pain Baseline: EVAL= 46% Goal status: INITIAL  2.  Pt will decrease worst neck pain as reported on NPRS by at least 2 points in order to demonstrate clinically significant reduction in back pain Baseline: EVAL= 4/10; 11/15/2023= 5-6/10 at worst but feeling a little looser Goal status: ONGOING  3.  Patient will demonstrate improved cervical ROM by 15 deg each motion for improved head motion and ability to drive.  Baseline: EVAL- See cervical AROM section for values. 11/15/2023- See above Chart- slight improvement to date. Goal status: PROGRESSING   PLAN:  PT FREQUENCY: 1-2x/week  PT DURATION: 12 weeks  PLANNED INTERVENTIONS: 97164- PT Re-evaluation, 97110-Therapeutic exercises, 97530- Therapeutic activity, 97112- Neuromuscular re-education, 97535- Self Care, 16109- Manual therapy, [phone]- Electrical stimulation (manual), Patient/Family education, Taping, Dry Needling, Joint mobilization, Joint manipulation, Spinal manipulation, Spinal mobilization, Cryotherapy, and Moist heat  PLAN FOR NEXT SESSION:  Manual therapy for pain relief and ROM,Progress in education in Cervical ROM and postural strengthening and add to HEP next visit.   Murlene Army, PT 11/15/2023, 11:37 AM

## 2023-11-17 ENCOUNTER — Ambulatory Visit

## 2023-11-17 DIAGNOSIS — M542 Cervicalgia: Secondary | ICD-10-CM | POA: Diagnosis not present

## 2023-11-17 DIAGNOSIS — M5382 Other specified dorsopathies, cervical region: Secondary | ICD-10-CM

## 2023-11-17 DIAGNOSIS — M6281 Muscle weakness (generalized): Secondary | ICD-10-CM | POA: Diagnosis not present

## 2023-11-17 NOTE — Therapy (Signed)
 OUTPATIENT PHYSICAL THERAPY CERVICAL TREATMENT   Patient Name: Larry [name] MRN: [id number] DOB:[birth year], 73 y.o., male Today's Date: 11/17/2023  END OF SESSION:  PT End of Session - 11/17/23 0858     Visit Number 11    Number of Visits 24    Date for PT Re-Evaluation 12/29/23    Progress Note Due on Visit 20    PT Start Time 0847    PT Stop Time 0929    PT Time Calculation (min) 42 min    Activity Tolerance Patient tolerated treatment well;No increased pain    Behavior During Therapy WFL for tasks assessed/performed                  Past Medical History:  Diagnosis Date   ADD (attention deficit disorder)    Allergy    seasonal flares of perinneal allergies   Cancer (HCC)    melanoma   Cardiac arrest (HCC) 07/1997   aborted   Dementia (HCC)    post resusitative   Depression    Diverticulosis    Family hx of prostate cancer    GERD (gastroesophageal reflux disease)    Headache(784.0)    Hyperlipidemia    Hypothyroidism    ICD (implantable cardiac defibrillator) in place    Idiopathic urticaria    Internal hemorrhoids    Low back pain syndrome    Melanoma in situ of skin of trunk (HCC)    chest   PVC (premature ventricular contraction)    associated with cardiac arrest   Sleep apnea    no c-pap   Tubular adenoma of colon 10/2010   Past Surgical History:  Procedure Laterality Date    implantation x2     CARDIAC DEFIBRILLATOR PLACEMENT  07/01/2010   Explanation of a previously implanted device, pocket revision, and insertion of a new device, and intraoperative defibrillation threshold testing Rodolfo Clan)   CARDIAC DEFIBRILLATOR REMOVAL  07/05/2002   Defibrillator change Rodolfo Clan) 3 times changed   CATARACT EXTRACTION W/PHACO Left 02/04/2021   Procedure: CATARACT EXTRACTION PHACO AND INTRAOCULAR LENS PLACEMENT (IOC) LEFT VIVITY toric LENS 9.21 01:11.6;  Surgeon: Annell Kidney, MD;  Location: [facility];  Service: Ophthalmology;   Laterality: Left;  keep patient at 11:30 arrival   CATARACT EXTRACTION W/PHACO Right 03/04/2021   Procedure: CATARACT EXTRACTION PHACO AND INTRAOCULAR LENS PLACEMENT (IOC) RIGHT VIVITY LENS 8.59 01:15.3;  Surgeon: Annell Kidney, MD;  Location: [facility];  Service: Ophthalmology;  Laterality: Right;   COLONOSCOPY     HEMORRHOID SURGERY     ICD     TONSILLECTOMY     Patient Active Problem List   Diagnosis Date Noted   Back pain 11/13/2023   Cervical dystonia 06/15/2022   Neck pain 07/02/2021   Gait abnormality 12/16/2020   Nonintractable headache 09/30/2020   Tinnitus 09/09/2019   History of sudden cardiac arrest 01/09/2019   Viral syndrome 09/06/2018   Seizures (HCC) 03/02/2018   Syncope 12/26/2017   Other social stressor 12/26/2017   Health care maintenance 08/14/2017   Family hx of prostate cancer    Allergy    Insomnia 10/20/2015   Advance care planning 03/08/2014   Memory loss 09/14/2013   Hypoxic brain injury (HCC) 09/14/2013   Medicare annual wellness visit, subsequent 02/16/2013   Shortness of breath 08/10/2011   Implantable cardioverter-defibrillator (ICD) in situ 02/03/2009   Hypothyroidism 11/18/2008   CONTACT DERMATITIS&OTHER ECZEMA DUE TO PLANTS 02/03/2008   IDIOPATHIC URTICARIA 02/03/2008   Hypertension 02/03/2008  HLD (hyperlipidemia) 11/09/07   Attention deficit disorder Nov 09, 2007   PVC/VT non sustained Nov 09, 2007   Sleep apnea Nov 09, 2007   HEADACHE 11-09-07   SUDDEN DEATH-aborted Nov 09, 2007   LOW BACK PAIN SYNDROME 10/11/2007    PCP: Dr. Richrd Char  REFERRING PROVIDER: Dr. Phebe Brasil  REFERRING DIAG:  G24.3 (ICD-10-CM) - Cervical dystonia  M54.2 (ICD-10-CM) - Neck pain    THERAPY DIAG:   Rationale for Evaluation and Treatment: Rehabilitation  ONSET DATE: 2020  SUBJECTIVE:                                                                                                                                                                                                          SUBJECTIVE STATEMENT: From Today: Patient reports doing better with HEP as he is using heat and states the combo helps.  From EVAL:  Patient reports having long standing neck tightness/pain. Does receive botox  injection with some relief yet very limited motion. If I try to move my neck- it just hurts.    Hand dominance: Right  PERTINENT HISTORY:  Anoxic brain injury in 1999 due to prolonged cardiac arrest,             With residual mild cognitive impairment             Slow worsening over the years             Continue Aricept  10 mg daily, Namenda  10 mg twice a day                 Seizure             Most recent in June 2019, EEG was normal             Continue lamotrigine  100 mg twice a day   Gait abnormality, worsening neck pain, limited range of motion, abnormal neck posturing, mild anterocollis, right tilt, significant tenderness along cervical paraspinal muscles, worse on the right side              CT cervical spine in June 2023, multilevel degenerative changes, severe right-sided C1-C2 osteoarthritis, variable degree of foraminal narrowing, no significant canal stenosis             He failed epidural injection, complains of increased confusion dizziness, unsteadiness with tizanidine, tramadol treatment,             Responded well to low-dose Botox  injection,             Refer to physical therapy  EMG guided Botox  injection, used 100 units today (100 units dissolving to 2 cc of normal saline)   Right levator scapular 25 units Right longissimus capitis 12.5 units Right semispinalis 12.5 units Right splenius capitis 12.5 units Right splenius cervix 12.5 units Right iliocostalis 12.5 units Right upper trapezius 12.5 units   He tolerated injection well, will return to clinic in 3 months for repeat injection    PAIN:  Are you having pain? Yes: NPRS scale: 4/10 neck pain R> L and upper trap and having some low  back pain as well.   Pain location: posterior neck  Pain description: ache yet sharp Aggravating factors: active head motion, lifting,  Relieving factors: rest, Injections  PRECAUTIONS: Fall  RED FLAGS: None     WEIGHT BEARING RESTRICTIONS: No  FALLS:  Has patient fallen in last 6 months? Yes. Number of falls 3-4  LIVING ENVIRONMENT: Lives with: lives with their spouse Lives in: House/apartment Stairs: No Has following equipment at home: None  OCCUPATION: not working  PLOF: Independent  PATIENT GOALS: Move my neck better for safety and improve my pain  NEXT MD VISIT: unsure  OBJECTIVE:  Note: Objective measures were completed at Evaluation unless otherwise noted.  DIAGNOSTIC FINDINGS:  CLINICAL DATA:  Chronic, progressively worsening neck pain.   EXAM: CT CERVICAL SPINE WITHOUT CONTRAST   TECHNIQUE: Multidetector CT imaging of the cervical spine was performed without intravenous contrast. Multiplanar CT image reconstructions were also generated.   RADIATION DOSE REDUCTION: This exam was performed according to the departmental dose-optimization program which includes automated exposure control, adjustment of the mA and/or kV according to patient size and/or use of iterative reconstruction technique.   COMPARISON:  Cervical spine x-rays dated July 16, 2021.   FINDINGS: Alignment: Straightening of the normal cervical lordosis. Trace anterolisthesis at C3-C4 and C7-T1.   Skull base and vertebrae: No acute fracture. Severe right-sided C1-C2 osteoarthritis. No primary bone lesion or focal pathologic process. C4 vertebral bone island extending into the left posterior elements.   Soft tissues and spinal canal: No prevertebral fluid or swelling. No visible canal hematoma.   Disc levels:   C2-C3: Small central disc protrusion. Moderate right facet arthropathy. No stenosis.   C3-C4: Small central disc protrusion. Mild bilateral uncovertebral hypertrophy.  Moderate bilateral facet arthropathy. No stenosis.   C4-C5: Small posterior disc osteophyte complex. Moderate right and mild left uncovertebral hypertrophy. Moderate right facet arthropathy. Mild right neuroforaminal stenosis. No spinal canal or left neuroforaminal stenosis.   C5-C6: Small posterior disc osteophyte complex and moderate bilateral uncovertebral hypertrophy. No stenosis.   C6-C7: Small posterior disc osteophyte complex asymmetric to the right. Moderate left and mild right uncovertebral hypertrophy. Mild left neuroforaminal stenosis. No spinal canal or right neuroforaminal stenosis.   C7-T1: Negative disc. Mild bilateral facet uncovertebral hypertrophy. No stenosis.   Upper chest: Negative.   Other: None.   IMPRESSION: 1. Multilevel degenerative changes of the cervical spine as described above. No high-grade spinal canal or neuroforaminal stenosis. 2. Severe right-sided C1-C2 osteoarthritis.     Electronically Signed   By: Aleta Anda M.D.   On: 12/28/2021 13:55    PATIENT SURVEYS:  NDI 46%  COGNITION: Overall cognitive status: History of cognitive impairments - at baseline  SENSATION: WFL  POSTURE: Very stiff c-spine  PALPATION: (+) right sided tenderness occiput down to UT region   CERVICAL ROM:   Active ROM A/PROM (deg) eval AROM 11/15/2023 AROM 11/17/2023  Flexion 25    Extension 6  Right lateral flexion 8 18 23   Left lateral flexion 12 14 20   Right rotation 25 28 32  Left rotation 28 28 40   (Blank rows = not tested)  UPPER EXTREMITY ROM:  Active ROM Right eval Left eval  Shoulder flexion    Shoulder extension    Shoulder abduction    Shoulder adduction    Shoulder extension    Shoulder internal rotation    Shoulder external rotation    Elbow flexion    Elbow extension    Wrist flexion    Wrist extension    Wrist ulnar deviation    Wrist radial deviation    Wrist pronation    Wrist supination     (Blank rows = not  tested)  UPPER EXTREMITY MMT:  MMT Right eval Left eval  Shoulder flexion 2- 2-  Shoulder extension    Shoulder abduction 2- 2-  Shoulder adduction    Shoulder extension    Shoulder internal rotation    Shoulder external rotation    Middle trapezius    Lower trapezius    Elbow flexion 4 4  Elbow extension 4 4  Wrist flexion 4 4  Wrist extension 4 4  Wrist ulnar deviation    Wrist radial deviation    Wrist pronation 4 4  Wrist supination 4 4  Grip strength     (Blank rows = not tested)  CERVICAL SPECIAL TESTS:  Pain with all active movement - unable to test  FUNCTIONAL TESTS:  none  TREATMENT DATE: 11/17/23   Manual Therapy:  STM to cervical paraspinals (multifidus) and R UT x 8 min.  Gentle manual traction (cervical) hold 60 sec x 3 Occipital release - 60 sec hold x 3    Remeasured cervical values- See above ROM section  THEREX: AROM: Cervical Rotation 2x10 AROM: Cervical SB 2 x 10 AROM: Cervical Flex/Ext 2 x 10  PROM to cervical Rotation and SB x 6 min   NMR: for postural awareness Chin tuck- hold 5 sec 2 sets of 10 reps  Active Scap retraction 2 x 10 reps Wall posture stretch hold 60 sec x 2 Wall chin tuck x 10 reps (3-5 sec hold)  Wall angels 2 x 10 reps     PATIENT EDUCATION:  Education details: Exercise technique; importance of postural training Person educated: Patient and Spouse Education method: Explanation, Demonstration, Tactile cues, and Verbal cues Education comprehension: verbalized understanding and returned demonstration  HOME EXERCISE PROGRAM: Access Code: [email or web address] URL: [email or web address] Date: 10/20/2023 Prepared by: Ferrell Hu  Exercises - Standing Shoulder Row with Anchored Resistance  - 3 x weekly - 3 sets - 10-12 reps - Standing Row with Resistance with Anchored Resistance at Chest Height Palms Down  - 3 x weekly - 3 sets - 10-12 reps - Wall Angels  - 3 x weekly - 3 sets - 10 reps    Access  Code: [email or web address] URL: [email or web address] Date: 10/11/2023 Prepared by: Ferrell Hu  Exercises - Supine Chin Tuck  - 1 x daily - 3 sets - 10 reps - Supine Deep Neck Flexor Training - Repetitions  - 1 x daily - 3 sets - 10 reps - Seated Cervical Retraction  - 1 x daily - 3 sets - 10 reps  ASSESSMENT:  CLINICAL IMPRESSION: Patient is a 73 y.o. male who was seen today for physical therapy treatment for cervical dystonia and neck pain. Patient presented with improved overall ROM after treatment (see above measurements). Wife has been reinforcing his  HEP which appears to be making a difference. He was responsive again to all postural activities and reported pain down to 4/10 at end of session. Patient will benefit from skilled PT to assist with pain relief, ROM and improved functional mobility for optimal quality of life.    OBJECTIVE IMPAIRMENTS: Abnormal gait, decreased balance, decreased ROM, decreased strength, hypomobility, impaired flexibility, impaired UE functional use, and pain.   ACTIVITY LIMITATIONS: carrying, lifting, bending, and sleeping  PARTICIPATION LIMITATIONS: meal prep, cleaning, laundry, driving, shopping, community activity, and yard work  PERSONAL FACTORS: 1 comorbidity: anoxic brain injury are also affecting patient's functional outcome.   REHAB POTENTIAL: Good  CLINICAL DECISION MAKING: Stable/uncomplicated  EVALUATION COMPLEXITY: Low   GOALS: Goals reviewed with patient? Yes  SHORT TERM GOALS: Target date: 11/17/2023  Pt will be independent with HEP in order to improve strength and decrease back pain in order to improve pain-free function at home and work. Baseline: EVAL- No formal HEP in place; 11/15/2023= Patient reports more compliant with current HEP- and using heat prior to exercises.   Goal status: MET  LONG TERM GOALS: Target date: 12/29/2023  Pt will demonstrate decrease in NDI by at least 19% in order to demonstrate clinically  significant reduction in disability related to neck injury/pain Baseline: EVAL= 46% Goal status: INITIAL  2.  Pt will decrease worst neck pain as reported on NPRS by at least 2 points in order to demonstrate clinically significant reduction in back pain Baseline: EVAL= 4/10; 11/15/2023= 5-6/10 at worst but feeling a little looser Goal status: ONGOING  3.  Patient will demonstrate improved cervical ROM by 15 deg each motion for improved head motion and ability to drive.  Baseline: EVAL- See cervical AROM section for values. 11/15/2023- See above Chart- slight improvement to date. Goal status: PROGRESSING   PLAN:  PT FREQUENCY: 1-2x/week  PT DURATION: 12 weeks  PLANNED INTERVENTIONS: 97164- PT Re-evaluation, 97110-Therapeutic exercises, 97530- Therapeutic activity, 97112- Neuromuscular re-education, 97535- Self Care, 16109- Manual therapy, [phone]- Electrical stimulation (manual), Patient/Family education, Taping, Dry Needling, Joint mobilization, Joint manipulation, Spinal manipulation, Spinal mobilization, Cryotherapy, and Moist heat  PLAN FOR NEXT SESSION:  Manual therapy for pain relief and ROM,Progress in education in Cervical ROM and postural strengthening and add to HEP next visit.   Murlene Army, PT 11/17/2023, 11:11 PM

## 2023-11-22 ENCOUNTER — Ambulatory Visit

## 2023-11-22 DIAGNOSIS — M5382 Other specified dorsopathies, cervical region: Secondary | ICD-10-CM

## 2023-11-22 DIAGNOSIS — M6281 Muscle weakness (generalized): Secondary | ICD-10-CM | POA: Diagnosis not present

## 2023-11-22 DIAGNOSIS — M542 Cervicalgia: Secondary | ICD-10-CM

## 2023-11-22 NOTE — Therapy (Signed)
 OUTPATIENT PHYSICAL THERAPY CERVICAL TREATMENT   Patient Name: Larry [name] MRN: [id number] DOB:26-Oct-1950, 73 y.o., male Today's Date: 11/22/2023  END OF SESSION:  PT End of Session - 11/22/23 0854     Visit Number 12    Number of Visits 24    Date for PT Re-Evaluation 12/29/23    Progress Note Due on Visit 20    PT Start Time 0848    PT Stop Time 0921    PT Time Calculation (min) 33 min    Activity Tolerance Patient tolerated treatment well;No increased pain    Behavior During Therapy WFL for tasks assessed/performed                  Past Medical History:  Diagnosis Date   ADD (attention deficit disorder)    Allergy    seasonal flares of perinneal allergies   Cancer (HCC)    melanoma   Cardiac arrest (HCC) 07/1997   aborted   Dementia (HCC)    post resusitative   Depression    Diverticulosis    Family hx of prostate cancer    GERD (gastroesophageal reflux disease)    Headache(784.0)    Hyperlipidemia    Hypothyroidism    ICD (implantable cardiac defibrillator) in place    Idiopathic urticaria    Internal hemorrhoids    Low back pain syndrome    Melanoma in situ of skin of trunk (HCC)    chest   PVC (premature ventricular contraction)    associated with cardiac arrest   Sleep apnea    no c-pap   Tubular adenoma of colon 10/2010   Past Surgical History:  Procedure Laterality Date    implantation x2     CARDIAC DEFIBRILLATOR PLACEMENT  07/01/2010   Explanation of a previously implanted device, pocket revision, and insertion of a new device, and intraoperative defibrillation threshold testing Rodolfo Clan)   CARDIAC DEFIBRILLATOR REMOVAL  07/05/2002   Defibrillator change Rodolfo Clan) 3 times changed   CATARACT EXTRACTION W/PHACO Left 02/04/2021   Procedure: CATARACT EXTRACTION PHACO AND INTRAOCULAR LENS PLACEMENT (IOC) LEFT VIVITY toric LENS 9.21 01:11.6;  Surgeon: Annell Kidney, MD;  Location: [facility];  Service: Ophthalmology;   Laterality: Left;  keep patient at 11:30 arrival   CATARACT EXTRACTION W/PHACO Right 03/04/2021   Procedure: CATARACT EXTRACTION PHACO AND INTRAOCULAR LENS PLACEMENT (IOC) RIGHT VIVITY LENS 8.59 01:15.3;  Surgeon: Annell Kidney, MD;  Location: [facility];  Service: Ophthalmology;  Laterality: Right;   COLONOSCOPY     HEMORRHOID SURGERY     ICD     TONSILLECTOMY     Patient Active Problem List   Diagnosis Date Noted   Back pain 11/13/2023   Cervical dystonia 06/15/2022   Neck pain 07/02/2021   Gait abnormality 12/16/2020   Nonintractable headache 09/30/2020   Tinnitus 09/09/2019   History of sudden cardiac arrest 01/09/2019   Viral syndrome 09/06/2018   Seizures (HCC) 03/02/2018   Syncope 12/26/2017   Other social stressor 12/26/2017   Health care maintenance 08/14/2017   Family hx of prostate cancer    Allergy    Insomnia 10/20/2015   Advance care planning 03/08/2014   Memory loss 09/14/2013   Hypoxic brain injury (HCC) 09/14/2013   Medicare annual wellness visit, subsequent 02/16/2013   Shortness of breath 08/10/2011   Implantable cardioverter-defibrillator (ICD) in situ 02/03/2009   Hypothyroidism 11/18/2008   CONTACT DERMATITIS&OTHER ECZEMA DUE TO PLANTS 02/03/2008   IDIOPATHIC URTICARIA 02/03/2008   Hypertension 02/03/2008  HLD (hyperlipidemia) 11/22/2007   Attention deficit disorder 11-22-2007   PVC/VT non sustained 11-22-07   Sleep apnea Nov 22, 2007   HEADACHE 11/22/2007   SUDDEN DEATH-aborted 22-Nov-2007   LOW BACK PAIN SYNDROME 10/11/2007    PCP: Dr. Richrd Char  REFERRING PROVIDER: Dr. Phebe Brasil  REFERRING DIAG:  G24.3 (ICD-10-CM) - Cervical dystonia  M54.2 (ICD-10-CM) - Neck pain    THERAPY DIAG:   Rationale for Evaluation and Treatment: Rehabilitation  ONSET DATE: 2020  SUBJECTIVE:                                                                                                                                                                                                          SUBJECTIVE STATEMENT: From Today: Patient reports doing okay- using the heat and then performing the HEP.   From EVAL:  Patient reports having long standing neck tightness/pain. Does receive botox  injection with some relief yet very limited motion. If I try to move my neck- it just hurts.    Hand dominance: Right  PERTINENT HISTORY:  Anoxic brain injury in 1999 due to prolonged cardiac arrest,             With residual mild cognitive impairment             Slow worsening over the years             Continue Aricept  10 mg daily, Namenda  10 mg twice a day                 Seizure             Most recent in June 2019, EEG was normal             Continue lamotrigine  100 mg twice a day   Gait abnormality, worsening neck pain, limited range of motion, abnormal neck posturing, mild anterocollis, right tilt, significant tenderness along cervical paraspinal muscles, worse on the right side              CT cervical spine in June 2023, multilevel degenerative changes, severe right-sided C1-C2 osteoarthritis, variable degree of foraminal narrowing, no significant canal stenosis             He failed epidural injection, complains of increased confusion dizziness, unsteadiness with tizanidine, tramadol treatment,             Responded well to low-dose Botox  injection,             Refer to physical therapy  EMG guided Botox  injection, used 100 units today (100 units dissolving to 2 cc of normal saline)   Right levator scapular 25 units Right longissimus capitis 12.5 units Right semispinalis 12.5 units Right splenius capitis 12.5 units Right splenius cervix 12.5 units Right iliocostalis 12.5 units Right upper trapezius 12.5 units   He tolerated injection well, will return to clinic in 3 months for repeat injection    PAIN:  Are you having pain? Yes: NPRS scale: 4/10 neck pain R> L and upper trap and having some low back pain as  well.   Pain location: posterior neck  Pain description: ache yet sharp Aggravating factors: active head motion, lifting,  Relieving factors: rest, Injections  PRECAUTIONS: Fall  RED FLAGS: None     WEIGHT BEARING RESTRICTIONS: No  FALLS:  Has patient fallen in last 6 months? Yes. Number of falls 3-4  LIVING ENVIRONMENT: Lives with: lives with their spouse Lives in: House/apartment Stairs: No Has following equipment at home: None  OCCUPATION: not working  PLOF: Independent  PATIENT GOALS: Move my neck better for safety and improve my pain  NEXT MD VISIT: unsure  OBJECTIVE:  Note: Objective measures were completed at Evaluation unless otherwise noted.  DIAGNOSTIC FINDINGS:  CLINICAL DATA:  Chronic, progressively worsening neck pain.   EXAM: CT CERVICAL SPINE WITHOUT CONTRAST   TECHNIQUE: Multidetector CT imaging of the cervical spine was performed without intravenous contrast. Multiplanar CT image reconstructions were also generated.   RADIATION DOSE REDUCTION: This exam was performed according to the departmental dose-optimization program which includes automated exposure control, adjustment of the mA and/or kV according to patient size and/or use of iterative reconstruction technique.   COMPARISON:  Cervical spine x-rays dated July 16, 2021.   FINDINGS: Alignment: Straightening of the normal cervical lordosis. Trace anterolisthesis at C3-C4 and C7-T1.   Skull base and vertebrae: No acute fracture. Severe right-sided C1-C2 osteoarthritis. No primary bone lesion or focal pathologic process. C4 vertebral bone island extending into the left posterior elements.   Soft tissues and spinal canal: No prevertebral fluid or swelling. No visible canal hematoma.   Disc levels:   C2-C3: Small central disc protrusion. Moderate right facet arthropathy. No stenosis.   C3-C4: Small central disc protrusion. Mild bilateral uncovertebral hypertrophy. Moderate  bilateral facet arthropathy. No stenosis.   C4-C5: Small posterior disc osteophyte complex. Moderate right and mild left uncovertebral hypertrophy. Moderate right facet arthropathy. Mild right neuroforaminal stenosis. No spinal canal or left neuroforaminal stenosis.   C5-C6: Small posterior disc osteophyte complex and moderate bilateral uncovertebral hypertrophy. No stenosis.   C6-C7: Small posterior disc osteophyte complex asymmetric to the right. Moderate left and mild right uncovertebral hypertrophy. Mild left neuroforaminal stenosis. No spinal canal or right neuroforaminal stenosis.   C7-T1: Negative disc. Mild bilateral facet uncovertebral hypertrophy. No stenosis.   Upper chest: Negative.   Other: None.   IMPRESSION: 1. Multilevel degenerative changes of the cervical spine as described above. No high-grade spinal canal or neuroforaminal stenosis. 2. Severe right-sided C1-C2 osteoarthritis.     Electronically Signed   By: Aleta Anda M.D.   On: 12/28/2021 13:55    PATIENT SURVEYS:  NDI 46%  COGNITION: Overall cognitive status: History of cognitive impairments - at baseline  SENSATION: WFL  POSTURE: Very stiff c-spine  PALPATION: (+) right sided tenderness occiput down to UT region   CERVICAL ROM:   Active ROM A/PROM (deg) eval AROM 11/15/2023 AROM 11/17/2023  Flexion 25    Extension 6  Right lateral flexion 8 18 23   Left lateral flexion 12 14 20   Right rotation 25 28 32  Left rotation 28 28 40   (Blank rows = not tested)  UPPER EXTREMITY ROM:  Active ROM Right eval Left eval  Shoulder flexion    Shoulder extension    Shoulder abduction    Shoulder adduction    Shoulder extension    Shoulder internal rotation    Shoulder external rotation    Elbow flexion    Elbow extension    Wrist flexion    Wrist extension    Wrist ulnar deviation    Wrist radial deviation    Wrist pronation    Wrist supination     (Blank rows = not  tested)  UPPER EXTREMITY MMT:  MMT Right eval Left eval  Shoulder flexion 2- 2-  Shoulder extension    Shoulder abduction 2- 2-  Shoulder adduction    Shoulder extension    Shoulder internal rotation    Shoulder external rotation    Middle trapezius    Lower trapezius    Elbow flexion 4 4  Elbow extension 4 4  Wrist flexion 4 4  Wrist extension 4 4  Wrist ulnar deviation    Wrist radial deviation    Wrist pronation 4 4  Wrist supination 4 4  Grip strength     (Blank rows = not tested)  CERVICAL SPECIAL TESTS:  Pain with all active movement - unable to test  FUNCTIONAL TESTS:  none  TREATMENT DATE: 11/22/23   Manual Therapy:  STM to cervical paraspinals (multifidus) and R UT x 12 min.  Gentle manual traction (cervical) hold 60 sec x 3   THEREX:  PROM to cervical Rotation and SB x 8 min   NMR: for postural awareness Chin tuck at wall hold 5 sec 2 sets of 10 reps  Resistive  Scap retraction with BTB x 15 reps Wall posture stretch hold 60 sec Wall angels 2 x 10 reps     PATIENT EDUCATION:  Education details: Exercise technique; importance of postural training Person educated: Patient and Spouse Education method: Explanation, Demonstration, Tactile cues, and Verbal cues Education comprehension: verbalized understanding and returned demonstration  HOME EXERCISE PROGRAM: Access Code: [email or web address] URL: [email or web address] Date: 10/20/2023 Prepared by: Ferrell Hu  Exercises - Standing Shoulder Row with Anchored Resistance  - 3 x weekly - 3 sets - 10-12 reps - Standing Row with Resistance with Anchored Resistance at Chest Height Palms Down  - 3 x weekly - 3 sets - 10-12 reps - Wall Angels  - 3 x weekly - 3 sets - 10 reps    Access Code: [email or web address] URL: [email or web address] Date: 10/11/2023 Prepared by: Ferrell Hu  Exercises - Supine Chin Tuck  - 1 x daily - 3 sets - 10 reps - Supine Deep Neck Flexor  Training - Repetitions  - 1 x daily - 3 sets - 10 reps - Seated Cervical Retraction  - 1 x daily - 3 sets - 10 reps  ASSESSMENT:  CLINICAL IMPRESSION: Patient is a 73 y.o. male who was seen today for physical therapy treatment for cervical dystonia and neck pain. Patient continues to present with less tightness and improved PROM today- > 50 deg of rotation. Patient reported at end of session that "This is the best my neck has moved and felt in quite some time." He was able to progress to more resistive activities today without increased pain. Patient will benefit from skilled PT to  assist with pain relief, ROM and improved functional mobility for optimal quality of life.    OBJECTIVE IMPAIRMENTS: Abnormal gait, decreased balance, decreased ROM, decreased strength, hypomobility, impaired flexibility, impaired UE functional use, and pain.   ACTIVITY LIMITATIONS: carrying, lifting, bending, and sleeping  PARTICIPATION LIMITATIONS: meal prep, cleaning, laundry, driving, shopping, community activity, and yard work  PERSONAL FACTORS: 1 comorbidity: anoxic brain injury are also affecting patient's functional outcome.   REHAB POTENTIAL: Good  CLINICAL DECISION MAKING: Stable/uncomplicated  EVALUATION COMPLEXITY: Low   GOALS: Goals reviewed with patient? Yes  SHORT TERM GOALS: Target date: 11/17/2023  Pt will be independent with HEP in order to improve strength and decrease back pain in order to improve pain-free function at home and work. Baseline: EVAL- No formal HEP in place; 11/15/2023= Patient reports more compliant with current HEP- and using heat prior to exercises.   Goal status: MET  LONG TERM GOALS: Target date: 12/29/2023  Pt will demonstrate decrease in NDI by at least 19% in order to demonstrate clinically significant reduction in disability related to neck injury/pain Baseline: EVAL= 46% Goal status: INITIAL  2.  Pt will decrease worst neck pain as reported on NPRS by at least  2 points in order to demonstrate clinically significant reduction in back pain Baseline: EVAL= 4/10; 11/15/2023= 5-6/10 at worst but feeling a little looser Goal status: ONGOING  3.  Patient will demonstrate improved cervical ROM by 15 deg each motion for improved head motion and ability to drive.  Baseline: EVAL- See cervical AROM section for values. 11/15/2023- See above Chart- slight improvement to date. Goal status: PROGRESSING   PLAN:  PT FREQUENCY: 1-2x/week  PT DURATION: 12 weeks  PLANNED INTERVENTIONS: 97164- PT Re-evaluation, 97110-Therapeutic exercises, 97530- Therapeutic activity, 97112- Neuromuscular re-education, 97535- Self Care, 16109- Manual therapy, [phone]- Electrical stimulation (manual), Patient/Family education, Taping, Dry Needling, Joint mobilization, Joint manipulation, Spinal manipulation, Spinal mobilization, Cryotherapy, and Moist heat  PLAN FOR NEXT SESSION:  Manual therapy for pain relief and ROM,Progress in education in Cervical ROM and postural strengthening and add to HEP next visit.   Murlene Army, PT 11/22/2023, 9:28 AM

## 2023-11-24 ENCOUNTER — Ambulatory Visit

## 2023-11-24 DIAGNOSIS — M5382 Other specified dorsopathies, cervical region: Secondary | ICD-10-CM

## 2023-11-24 DIAGNOSIS — M542 Cervicalgia: Secondary | ICD-10-CM

## 2023-11-24 DIAGNOSIS — M6281 Muscle weakness (generalized): Secondary | ICD-10-CM | POA: Diagnosis not present

## 2023-11-24 NOTE — Therapy (Signed)
 OUTPATIENT PHYSICAL THERAPY CERVICAL TREATMENT   Patient Name: Larry [name] MRN: [id number] DOB:1950-09-16, 73 y.o., male Today's Date: 11/24/2023  END OF SESSION:  PT End of Session - 11/24/23 0937     Visit Number 13    Number of Visits 24    Date for PT Re-Evaluation 12/29/23    Progress Note Due on Visit 20    PT Start Time 0930    Activity Tolerance Patient tolerated treatment well;No increased pain    Behavior During Therapy WFL for tasks assessed/performed                  Past Medical History:  Diagnosis Date   ADD (attention deficit disorder)    Allergy    seasonal flares of perinneal allergies   Cancer (HCC)    melanoma   Cardiac arrest (HCC) 07/1997   aborted   Dementia (HCC)    post resusitative   Depression    Diverticulosis    Family hx of prostate cancer    GERD (gastroesophageal reflux disease)    Headache(784.0)    Hyperlipidemia    Hypothyroidism    ICD (implantable cardiac defibrillator) in place    Idiopathic urticaria    Internal hemorrhoids    Low back pain syndrome    Melanoma in situ of skin of trunk (HCC)    chest   PVC (premature ventricular contraction)    associated with cardiac arrest   Sleep apnea    no c-pap   Tubular adenoma of colon 10/2010   Past Surgical History:  Procedure Laterality Date    implantation x2     CARDIAC DEFIBRILLATOR PLACEMENT  07/01/2010   Explanation of a previously implanted device, pocket revision, and insertion of a new device, and intraoperative defibrillation threshold testing Rodolfo Clan)   CARDIAC DEFIBRILLATOR REMOVAL  07/05/2002   Defibrillator change Rodolfo Clan) 3 times changed   CATARACT EXTRACTION W/PHACO Left 02/04/2021   Procedure: CATARACT EXTRACTION PHACO AND INTRAOCULAR LENS PLACEMENT (IOC) LEFT VIVITY toric LENS 9.21 01:11.6;  Surgeon: Annell Kidney, MD;  Location: [facility];  Service: Ophthalmology;  Laterality: Left;  keep patient at 11:30 arrival   CATARACT  EXTRACTION W/PHACO Right 03/04/2021   Procedure: CATARACT EXTRACTION PHACO AND INTRAOCULAR LENS PLACEMENT (IOC) RIGHT VIVITY LENS 8.59 01:15.3;  Surgeon: Annell Kidney, MD;  Location: [facility];  Service: Ophthalmology;  Laterality: Right;   COLONOSCOPY     HEMORRHOID SURGERY     ICD     TONSILLECTOMY     Patient Active Problem List   Diagnosis Date Noted   Back pain 11/13/2023   Cervical dystonia 06/15/2022   Neck pain 07/02/2021   Gait abnormality 12/16/2020   Nonintractable headache 09/30/2020   Tinnitus 09/09/2019   History of sudden cardiac arrest 01/09/2019   Viral syndrome 09/06/2018   Seizures (HCC) 03/02/2018   Syncope 12/26/2017   Other social stressor 12/26/2017   Health care maintenance 08/14/2017   Family hx of prostate cancer    Allergy    Insomnia 10/20/2015   Advance care planning 03/08/2014   Memory loss 09/14/2013   Hypoxic brain injury (HCC) 09/14/2013   Medicare annual wellness visit, subsequent 02/16/2013   Shortness of breath 08/10/2011   Implantable cardioverter-defibrillator (ICD) in situ 02/03/2009   Hypothyroidism 11/18/2008   CONTACT DERMATITIS&OTHER ECZEMA DUE TO PLANTS 02/03/2008   IDIOPATHIC URTICARIA 02/03/2008   Hypertension 02/03/2008   HLD (hyperlipidemia) 10/27/2007   Attention deficit disorder 10/27/2007   PVC/VT non sustained 10/27/2007  Sleep apnea 10-30-07   HEADACHE Oct 30, 2007   SUDDEN DEATH-aborted 30-Oct-2007   LOW BACK PAIN SYNDROME 10/11/2007    PCP: Dr. Richrd Char  REFERRING PROVIDER: Dr. Phebe Brasil  REFERRING DIAG:  G24.3 (ICD-10-CM) - Cervical dystonia  M54.2 (ICD-10-CM) - Neck pain    THERAPY DIAG:   Rationale for Evaluation and Treatment: Rehabilitation  ONSET DATE: 2020  SUBJECTIVE:                                                                                                                                                                                                          SUBJECTIVE STATEMENT: From Today: Patient reports no new issues- neck is bothering him more today for some reason- Rates at 5/10.   From EVAL:  Patient reports having long standing neck tightness/pain. Does receive botox  injection with some relief yet very limited motion. If I try to move my neck- it just hurts.    Hand dominance: Right  PERTINENT HISTORY:  Anoxic brain injury in 1999 due to prolonged cardiac arrest,             With residual mild cognitive impairment             Slow worsening over the years             Continue Aricept  10 mg daily, Namenda  10 mg twice a day                 Seizure             Most recent in June 2019, EEG was normal             Continue lamotrigine  100 mg twice a day   Gait abnormality, worsening neck pain, limited range of motion, abnormal neck posturing, mild anterocollis, right tilt, significant tenderness along cervical paraspinal muscles, worse on the right side              CT cervical spine in June 2023, multilevel degenerative changes, severe right-sided C1-C2 osteoarthritis, variable degree of foraminal narrowing, no significant canal stenosis             He failed epidural injection, complains of increased confusion dizziness, unsteadiness with tizanidine, tramadol treatment,             Responded well to low-dose Botox  injection,             Refer to physical therapy                EMG guided Botox  injection, used 100 units  today (100 units dissolving to 2 cc of normal saline)   Right levator scapular 25 units Right longissimus capitis 12.5 units Right semispinalis 12.5 units Right splenius capitis 12.5 units Right splenius cervix 12.5 units Right iliocostalis 12.5 units Right upper trapezius 12.5 units   He tolerated injection well, will return to clinic in 3 months for repeat injection    PAIN:  Are you having pain? Yes: NPRS scale: 4/10 neck pain R> L and upper trap and having some low back pain as well.   Pain location:  posterior neck  Pain description: ache yet sharp Aggravating factors: active head motion, lifting,  Relieving factors: rest, Injections  PRECAUTIONS: Fall  RED FLAGS: None     WEIGHT BEARING RESTRICTIONS: No  FALLS:  Has patient fallen in last 6 months? Yes. Number of falls 3-4  LIVING ENVIRONMENT: Lives with: lives with their spouse Lives in: House/apartment Stairs: No Has following equipment at home: None  OCCUPATION: not working  PLOF: Independent  PATIENT GOALS: Move my neck better for safety and improve my pain  NEXT MD VISIT: unsure  OBJECTIVE:  Note: Objective measures were completed at Evaluation unless otherwise noted.  DIAGNOSTIC FINDINGS:  CLINICAL DATA:  Chronic, progressively worsening neck pain.   EXAM: CT CERVICAL SPINE WITHOUT CONTRAST   TECHNIQUE: Multidetector CT imaging of the cervical spine was performed without intravenous contrast. Multiplanar CT image reconstructions were also generated.   RADIATION DOSE REDUCTION: This exam was performed according to the departmental dose-optimization program which includes automated exposure control, adjustment of the mA and/or kV according to patient size and/or use of iterative reconstruction technique.   COMPARISON:  Cervical spine x-rays dated July 16, 2021.   FINDINGS: Alignment: Straightening of the normal cervical lordosis. Trace anterolisthesis at C3-C4 and C7-T1.   Skull base and vertebrae: No acute fracture. Severe right-sided C1-C2 osteoarthritis. No primary bone lesion or focal pathologic process. C4 vertebral bone island extending into the left posterior elements.   Soft tissues and spinal canal: No prevertebral fluid or swelling. No visible canal hematoma.   Disc levels:   C2-C3: Small central disc protrusion. Moderate right facet arthropathy. No stenosis.   C3-C4: Small central disc protrusion. Mild bilateral uncovertebral hypertrophy. Moderate bilateral facet arthropathy.  No stenosis.   C4-C5: Small posterior disc osteophyte complex. Moderate right and mild left uncovertebral hypertrophy. Moderate right facet arthropathy. Mild right neuroforaminal stenosis. No spinal canal or left neuroforaminal stenosis.   C5-C6: Small posterior disc osteophyte complex and moderate bilateral uncovertebral hypertrophy. No stenosis.   C6-C7: Small posterior disc osteophyte complex asymmetric to the right. Moderate left and mild right uncovertebral hypertrophy. Mild left neuroforaminal stenosis. No spinal canal or right neuroforaminal stenosis.   C7-T1: Negative disc. Mild bilateral facet uncovertebral hypertrophy. No stenosis.   Upper chest: Negative.   Other: None.   IMPRESSION: 1. Multilevel degenerative changes of the cervical spine as described above. No high-grade spinal canal or neuroforaminal stenosis. 2. Severe right-sided C1-C2 osteoarthritis.     Electronically Signed   By: Aleta Anda M.D.   On: 12/28/2021 13:55    PATIENT SURVEYS:  NDI 46%  COGNITION: Overall cognitive status: History of cognitive impairments - at baseline  SENSATION: WFL  POSTURE: Very stiff c-spine  PALPATION: (+) right sided tenderness occiput down to UT region   CERVICAL ROM:   Active ROM A/PROM (deg) eval AROM 11/15/2023 AROM 11/17/2023  Flexion 25    Extension 6    Right lateral flexion 8 18 23  Left lateral flexion 12 14 20   Right rotation 25 28 32  Left rotation 28 28 40   (Blank rows = not tested)  UPPER EXTREMITY ROM:  Active ROM Right eval Left eval  Shoulder flexion    Shoulder extension    Shoulder abduction    Shoulder adduction    Shoulder extension    Shoulder internal rotation    Shoulder external rotation    Elbow flexion    Elbow extension    Wrist flexion    Wrist extension    Wrist ulnar deviation    Wrist radial deviation    Wrist pronation    Wrist supination     (Blank rows = not tested)  UPPER EXTREMITY  MMT:  MMT Right eval Left eval  Shoulder flexion 2- 2-  Shoulder extension    Shoulder abduction 2- 2-  Shoulder adduction    Shoulder extension    Shoulder internal rotation    Shoulder external rotation    Middle trapezius    Lower trapezius    Elbow flexion 4 4  Elbow extension 4 4  Wrist flexion 4 4  Wrist extension 4 4  Wrist ulnar deviation    Wrist radial deviation    Wrist pronation 4 4  Wrist supination 4 4  Grip strength     (Blank rows = not tested)  CERVICAL SPECIAL TESTS:  Pain with all active movement - unable to test  FUNCTIONAL TESTS:  none  TREATMENT DATE: 11/24/23      Self care/Home management: provided HEP handout below Instructed in the following for HEP- Review of all musculature (cervical and thoracic) and their role with posture.   NMR:  -Towel AAROM cervical Rotation- hold 30 sec x 4 today ea direction -Towel mob with cervical ext x 10 reps x 2 -Scap retraction with BTB 3 x 10  -Shoulder ext with BTB 3 x 10  -Shoulder Horizontal ABD RTB 3 x 10 reps -Posterior shoulder roll x 20 reps       PATIENT EDUCATION:  Education details: Exercise technique; importance of postural training Person educated: Patient and Spouse Education method: Explanation, Demonstration, Tactile cues, and Verbal cues Education comprehension: verbalized understanding and returned demonstration  HOME EXERCISE PROGRAM:   Access Code: [email or web address] URL: [email or web address] Date: 11/24/2023 Prepared by: Ferrell Hu  Exercises - Seated Assisted Cervical Rotation with Towel  - 1 x daily - 3 sets - 30 sec  hold - Cervical Extension AROM with Strap  - 1 x daily - 3 sets - 10 reps - Scapular Retraction with Resistance  - 3 x weekly - 3 sets - 10 reps - Shoulder extension with resistance - Neutral  - 3 x weekly - 3 sets - 10 reps - Standing Shoulder Horizontal Abduction with Resistance  - 3 x weekly - 3 sets - 10 reps      Access Code:  [email or web address] URL: [email or web address] Date: 10/20/2023 Prepared by: Ferrell Hu  Exercises - Standing Shoulder Row with Anchored Resistance  - 3 x weekly - 3 sets - 10-12 reps - Standing Row with Resistance with Anchored Resistance at Chest Height Palms Down  - 3 x weekly - 3 sets - 10-12 reps - Wall Angels  - 3 x weekly - 3 sets - 10 reps    Access Code: [email or web address] URL: [email or web address] Date: 10/11/2023 Prepared by: Ferrell Hu  Exercises - Supine Chin Tuck  - 1 x daily - 3 sets - 10 reps - Supine Deep Neck Flexor  Training - Repetitions  - 1 x daily - 3 sets - 10 reps - Seated Cervical Retraction  - 1 x daily - 3 sets - 10 reps  ASSESSMENT:  CLINICAL IMPRESSION: Patient is a 73 y.o. male who was seen today for physical therapy treatment for cervical dystonia and neck pain. Patient presented with good response to more resistive postural activities. HE was also instructed in some AAROM to improve his stiffness with cervical rotation but will benefit from review to ensure he is performing correctly.  Patient will benefit from skilled PT to assist with pain relief, ROM and improved functional mobility for optimal quality of life.    OBJECTIVE IMPAIRMENTS: Abnormal gait, decreased balance, decreased ROM, decreased strength, hypomobility, impaired flexibility, impaired UE functional use, and pain.   ACTIVITY LIMITATIONS: carrying, lifting, bending, and sleeping  PARTICIPATION LIMITATIONS: meal prep, cleaning, laundry, driving, shopping, community activity, and yard work  PERSONAL FACTORS: 1 comorbidity: anoxic brain injury are also affecting patient's functional outcome.   REHAB POTENTIAL: Good  CLINICAL DECISION MAKING: Stable/uncomplicated  EVALUATION COMPLEXITY: Low   GOALS: Goals reviewed with patient? Yes  SHORT TERM GOALS: Target date: 11/17/2023  Pt will be independent with HEP in order to improve strength and decrease back  pain in order to improve pain-free function at home and work. Baseline: EVAL- No formal HEP in place; 11/15/2023= Patient reports more compliant with current HEP- and using heat prior to exercises.   Goal status: MET  LONG TERM GOALS: Target date: 12/29/2023  Pt will demonstrate decrease in NDI by at least 19% in order to demonstrate clinically significant reduction in disability related to neck injury/pain Baseline: EVAL= 46% Goal status: INITIAL  2.  Pt will decrease worst neck pain as reported on NPRS by at least 2 points in order to demonstrate clinically significant reduction in back pain Baseline: EVAL= 4/10; 11/15/2023= 5-6/10 at worst but feeling a little looser Goal status: ONGOING  3.  Patient will demonstrate improved cervical ROM by 15 deg each motion for improved head motion and ability to drive.  Baseline: EVAL- See cervical AROM section for values. 11/15/2023- See above Chart- slight improvement to date. Goal status: PROGRESSING   PLAN:  PT FREQUENCY: 1-2x/week  PT DURATION: 12 weeks  PLANNED INTERVENTIONS: 97164- PT Re-evaluation, 97110-Therapeutic exercises, 97530- Therapeutic activity, 97112- Neuromuscular re-education, 97535- Self Care, 16109- Manual therapy, [phone]- Electrical stimulation (manual), Patient/Family education, Taping, Dry Needling, Joint mobilization, Joint manipulation, Spinal manipulation, Spinal mobilization, Cryotherapy, and Moist heat  PLAN FOR NEXT SESSION:  Manual therapy for pain relief and ROM,Progress in education in Cervical ROM and postural strengthening and add to HEP next visit.   Murlene Army, PT 11/24/2023, 10:12 AM

## 2023-11-25 NOTE — Progress Notes (Signed)
 Remote ICD transmission.

## 2023-11-29 ENCOUNTER — Ambulatory Visit

## 2023-11-29 DIAGNOSIS — M542 Cervicalgia: Secondary | ICD-10-CM

## 2023-11-29 DIAGNOSIS — M5382 Other specified dorsopathies, cervical region: Secondary | ICD-10-CM

## 2023-11-29 DIAGNOSIS — M6281 Muscle weakness (generalized): Secondary | ICD-10-CM | POA: Diagnosis not present

## 2023-11-29 NOTE — Therapy (Signed)
 OUTPATIENT PHYSICAL THERAPY CERVICAL TREATMENT   Patient Name: Larry [name] MRN: [id number] DOB:[birth year], 73 y.o., male Today's Date: 11/29/2023  END OF SESSION:  PT End of Session - 11/29/23 1027     Visit Number 14    Number of Visits 24    Date for PT Re-Evaluation 12/29/23    Progress Note Due on Visit 20    PT Start Time 0930    PT Stop Time 1011    PT Time Calculation (min) 41 min    Activity Tolerance Patient tolerated treatment well;No increased pain    Behavior During Therapy WFL for tasks assessed/performed                   Past Medical History:  Diagnosis Date   ADD (attention deficit disorder)    Allergy    seasonal flares of perinneal allergies   Cancer (HCC)    melanoma   Cardiac arrest (HCC) 07/1997   aborted   Dementia (HCC)    post resusitative   Depression    Diverticulosis    Family hx of prostate cancer    GERD (gastroesophageal reflux disease)    Headache(784.0)    Hyperlipidemia    Hypothyroidism    ICD (implantable cardiac defibrillator) in place    Idiopathic urticaria    Internal hemorrhoids    Low back pain syndrome    Melanoma in situ of skin of trunk (HCC)    chest   PVC (premature ventricular contraction)    associated with cardiac arrest   Sleep apnea    no c-pap   Tubular adenoma of colon 10/2010   Past Surgical History:  Procedure Laterality Date    implantation x2     CARDIAC DEFIBRILLATOR PLACEMENT  07/01/2010   Explanation of a previously implanted device, pocket revision, and insertion of a new device, and intraoperative defibrillation threshold testing Rodolfo Clan)   CARDIAC DEFIBRILLATOR REMOVAL  07/05/2002   Defibrillator change Rodolfo Clan) 3 times changed   CATARACT EXTRACTION W/PHACO Left 02/04/2021   Procedure: CATARACT EXTRACTION PHACO AND INTRAOCULAR LENS PLACEMENT (IOC) LEFT VIVITY toric LENS 9.21 01:11.6;  Surgeon: Annell Kidney, MD;  Location: [facility];  Service: Ophthalmology;   Laterality: Left;  keep patient at 11:30 arrival   CATARACT EXTRACTION W/PHACO Right 03/04/2021   Procedure: CATARACT EXTRACTION PHACO AND INTRAOCULAR LENS PLACEMENT (IOC) RIGHT VIVITY LENS 8.59 01:15.3;  Surgeon: Annell Kidney, MD;  Location: [facility];  Service: Ophthalmology;  Laterality: Right;   COLONOSCOPY     HEMORRHOID SURGERY     ICD     TONSILLECTOMY     Patient Active Problem List   Diagnosis Date Noted   Back pain 11/13/2023   Cervical dystonia 06/15/2022   Neck pain 07/02/2021   Gait abnormality 12/16/2020   Nonintractable headache 09/30/2020   Tinnitus 09/09/2019   History of sudden cardiac arrest 01/09/2019   Viral syndrome 09/06/2018   Seizures (HCC) 03/02/2018   Syncope 12/26/2017   Other social stressor 12/26/2017   Health care maintenance 08/14/2017   Family hx of prostate cancer    Allergy    Insomnia 10/20/2015   Advance care planning 03/08/2014   Memory loss 09/14/2013   Hypoxic brain injury (HCC) 09/14/2013   Medicare annual wellness visit, subsequent 02/16/2013   Shortness of breath 08/10/2011   Implantable cardioverter-defibrillator (ICD) in situ 02/03/2009   Hypothyroidism 11/18/2008   CONTACT DERMATITIS&OTHER ECZEMA DUE TO PLANTS 02/03/2008   IDIOPATHIC URTICARIA 02/03/2008   Hypertension 02/03/2008  HLD (hyperlipidemia) 2007/11/11   Attention deficit disorder 11/11/2007   PVC/VT non sustained 11/11/07   Sleep apnea 11-11-07   HEADACHE 2007/11/11   SUDDEN DEATH-aborted 11/11/07   LOW BACK PAIN SYNDROME 10/11/2007    PCP: Dr. Richrd Char  REFERRING PROVIDER: Dr. Phebe Brasil  REFERRING DIAG:  G24.3 (ICD-10-CM) - Cervical dystonia  M54.2 (ICD-10-CM) - Neck pain    THERAPY DIAG:   Rationale for Evaluation and Treatment: Rehabilitation  ONSET DATE: 2020  SUBJECTIVE:                                                                                                                                                                                                          SUBJECTIVE STATEMENT: From Today: Patient reports he did try the newer activities with the towel. States he is doing okay today- rates pain at 6/10.   From EVAL:  Patient reports having long standing neck tightness/pain. Does receive botox  injection with some relief yet very limited motion. If I try to move my neck- it just hurts.    Hand dominance: Right  PERTINENT HISTORY:  Anoxic brain injury in 1999 due to prolonged cardiac arrest,             With residual mild cognitive impairment             Slow worsening over the years             Continue Aricept  10 mg daily, Namenda  10 mg twice a day                 Seizure             Most recent in June 2019, EEG was normal             Continue lamotrigine  100 mg twice a day   Gait abnormality, worsening neck pain, limited range of motion, abnormal neck posturing, mild anterocollis, right tilt, significant tenderness along cervical paraspinal muscles, worse on the right side              CT cervical spine in June 2023, multilevel degenerative changes, severe right-sided C1-C2 osteoarthritis, variable degree of foraminal narrowing, no significant canal stenosis             He failed epidural injection, complains of increased confusion dizziness, unsteadiness with tizanidine, tramadol treatment,             Responded well to low-dose Botox  injection,             Refer to physical therapy  EMG guided Botox  injection, used 100 units today (100 units dissolving to 2 cc of normal saline)   Right levator scapular 25 units Right longissimus capitis 12.5 units Right semispinalis 12.5 units Right splenius capitis 12.5 units Right splenius cervix 12.5 units Right iliocostalis 12.5 units Right upper trapezius 12.5 units   He tolerated injection well, will return to clinic in 3 months for repeat injection    PAIN:  Are you having pain? Yes: NPRS scale: 4/10 neck pain R> L and  upper trap and having some low back pain as well.   Pain location: posterior neck  Pain description: ache yet sharp Aggravating factors: active head motion, lifting,  Relieving factors: rest, Injections  PRECAUTIONS: Fall  RED FLAGS: None     WEIGHT BEARING RESTRICTIONS: No  FALLS:  Has patient fallen in last 6 months? Yes. Number of falls 3-4  LIVING ENVIRONMENT: Lives with: lives with their spouse Lives in: House/apartment Stairs: No Has following equipment at home: None  OCCUPATION: not working  PLOF: Independent  PATIENT GOALS: Move my neck better for safety and improve my pain  NEXT MD VISIT: unsure  OBJECTIVE:  Note: Objective measures were completed at Evaluation unless otherwise noted.  DIAGNOSTIC FINDINGS:  CLINICAL DATA:  Chronic, progressively worsening neck pain.   EXAM: CT CERVICAL SPINE WITHOUT CONTRAST   TECHNIQUE: Multidetector CT imaging of the cervical spine was performed without intravenous contrast. Multiplanar CT image reconstructions were also generated.   RADIATION DOSE REDUCTION: This exam was performed according to the departmental dose-optimization program which includes automated exposure control, adjustment of the mA and/or kV according to patient size and/or use of iterative reconstruction technique.   COMPARISON:  Cervical spine x-rays dated July 16, 2021.   FINDINGS: Alignment: Straightening of the normal cervical lordosis. Trace anterolisthesis at C3-C4 and C7-T1.   Skull base and vertebrae: No acute fracture. Severe right-sided C1-C2 osteoarthritis. No primary bone lesion or focal pathologic process. C4 vertebral bone island extending into the left posterior elements.   Soft tissues and spinal canal: No prevertebral fluid or swelling. No visible canal hematoma.   Disc levels:   C2-C3: Small central disc protrusion. Moderate right facet arthropathy. No stenosis.   C3-C4: Small central disc protrusion. Mild  bilateral uncovertebral hypertrophy. Moderate bilateral facet arthropathy. No stenosis.   C4-C5: Small posterior disc osteophyte complex. Moderate right and mild left uncovertebral hypertrophy. Moderate right facet arthropathy. Mild right neuroforaminal stenosis. No spinal canal or left neuroforaminal stenosis.   C5-C6: Small posterior disc osteophyte complex and moderate bilateral uncovertebral hypertrophy. No stenosis.   C6-C7: Small posterior disc osteophyte complex asymmetric to the right. Moderate left and mild right uncovertebral hypertrophy. Mild left neuroforaminal stenosis. No spinal canal or right neuroforaminal stenosis.   C7-T1: Negative disc. Mild bilateral facet uncovertebral hypertrophy. No stenosis.   Upper chest: Negative.   Other: None.   IMPRESSION: 1. Multilevel degenerative changes of the cervical spine as described above. No high-grade spinal canal or neuroforaminal stenosis. 2. Severe right-sided C1-C2 osteoarthritis.     Electronically Signed   By: Aleta Anda M.D.   On: 12/28/2021 13:55    PATIENT SURVEYS:  NDI 46%  COGNITION: Overall cognitive status: History of cognitive impairments - at baseline  SENSATION: WFL  POSTURE: Very stiff c-spine  PALPATION: (+) right sided tenderness occiput down to UT region   CERVICAL ROM:   Active ROM A/PROM (deg) eval AROM 11/15/2023 AROM 11/17/2023  Flexion 25    Extension 6  Right lateral flexion 8 18 23   Left lateral flexion 12 14 20   Right rotation 25 28 32  Left rotation 28 28 40   (Blank rows = not tested)  UPPER EXTREMITY ROM:  Active ROM Right eval Left eval  Shoulder flexion    Shoulder extension    Shoulder abduction    Shoulder adduction    Shoulder extension    Shoulder internal rotation    Shoulder external rotation    Elbow flexion    Elbow extension    Wrist flexion    Wrist extension    Wrist ulnar deviation    Wrist radial deviation    Wrist pronation     Wrist supination     (Blank rows = not tested)  UPPER EXTREMITY MMT:  MMT Right eval Left eval  Shoulder flexion 2- 2-  Shoulder extension    Shoulder abduction 2- 2-  Shoulder adduction    Shoulder extension    Shoulder internal rotation    Shoulder external rotation    Middle trapezius    Lower trapezius    Elbow flexion 4 4  Elbow extension 4 4  Wrist flexion 4 4  Wrist extension 4 4  Wrist ulnar deviation    Wrist radial deviation    Wrist pronation 4 4  Wrist supination 4 4  Grip strength     (Blank rows = not tested)  CERVICAL SPECIAL TESTS:  Pain with all active movement - unable to test  FUNCTIONAL TESTS:  none  TREATMENT DATE: 11/29/23      THEREX: Georga Killings AAROM cervical Rotation- hold 10 sec x 10 today ea direction -Towel mob with cervical ext x 10 reps x 2 -Towel AAROM cervical SP - hold 10 sec x 10 today ea direction  NMR:  -Scap retraction with BTB 3 x 10  -Shoulder ext with BTB 3 x 10  -Shoulder Horizontal ABD RTB 3 x 10 reps -Posterior shoulder roll x 20 reps  Self management:  Added Wall push up 2 x 10  Added Standing Y lift off- 2 x 10  *added to HEP and issued handout       PATIENT EDUCATION:  Education details: Exercise technique; importance of postural training Person educated: Patient and Spouse Education method: Explanation, Demonstration, Tactile cues, and Verbal cues Education comprehension: verbalized understanding and returned demonstration  HOME EXERCISE PROGRAM:  Access Code: [email or web address] URL: [email or web address] Date: 11/29/2023 Prepared by: Ferrell Hu  Exercises - Wall Push Up  - 3 x weekly - 3 sets - 10 reps - Low Trap Setting at Wall  - 3 x weekly - 3 sets - 10 reps       Access Code: [email or web address] URL: [email or web address] Date: 11/24/2023 Prepared by: Ferrell Hu  Exercises - Seated Assisted Cervical Rotation with Towel  - 1 x daily - 3 sets - 30 sec  hold -  Cervical Extension AROM with Strap  - 1 x daily - 3 sets - 10 reps - Scapular Retraction with Resistance  - 3 x weekly - 3 sets - 10 reps - Shoulder extension with resistance - Neutral  - 3 x weekly - 3 sets - 10 reps - Standing Shoulder Horizontal Abduction with Resistance  - 3 x weekly - 3 sets - 10 reps      Access Code: [email or web address] URL: [email or web address] Date: 10/20/2023 Prepared by: Ferrell Hu  Exercises - Standing Shoulder Row with Anchored Resistance  - 3 x weekly - 3 sets - 10-12 reps - Standing  Row with Resistance with Anchored Resistance at Chest Height Palms Down  - 3 x weekly - 3 sets - 10-12 reps - Wall Angels  - 3 x weekly - 3 sets - 10 reps    Access Code: [email or web address] URL: [email or web address] Date: 10/11/2023 Prepared by: Ferrell Hu  Exercises - Supine Chin Tuck  - 1 x daily - 3 sets - 10 reps - Supine Deep Neck Flexor Training - Repetitions  - 1 x daily - 3 sets - 10 reps - Seated Cervical Retraction  - 1 x daily - 3 sets - 10 reps  ASSESSMENT:  CLINICAL IMPRESSION: Patient is a 73 y.o. male who was seen today for physical therapy treatment for cervical dystonia and neck pain. Patient continues to respond well overall to ROM and postural training. He still requires some VC to perform correctly but able to progress and improve with coordination with using towel.  Progressed more postural strengthening/awareness activities without report of pain. Patient will benefit from skilled PT to assist with pain relief, ROM and improved functional mobility for optimal quality of life.    OBJECTIVE IMPAIRMENTS: Abnormal gait, decreased balance, decreased ROM, decreased strength, hypomobility, impaired flexibility, impaired UE functional use, and pain.   ACTIVITY LIMITATIONS: carrying, lifting, bending, and sleeping  PARTICIPATION LIMITATIONS: meal prep, cleaning, laundry, driving, shopping, community activity, and yard  work  PERSONAL FACTORS: 1 comorbidity: anoxic brain injury are also affecting patient's functional outcome.   REHAB POTENTIAL: Good  CLINICAL DECISION MAKING: Stable/uncomplicated  EVALUATION COMPLEXITY: Low   GOALS: Goals reviewed with patient? Yes  SHORT TERM GOALS: Target date: 11/17/2023  Pt will be independent with HEP in order to improve strength and decrease back pain in order to improve pain-free function at home and work. Baseline: EVAL- No formal HEP in place; 11/15/2023= Patient reports more compliant with current HEP- and using heat prior to exercises.   Goal status: MET  LONG TERM GOALS: Target date: 12/29/2023  Pt will demonstrate decrease in NDI by at least 19% in order to demonstrate clinically significant reduction in disability related to neck injury/pain Baseline: EVAL= 46% Goal status: INITIAL  2.  Pt will decrease worst neck pain as reported on NPRS by at least 2 points in order to demonstrate clinically significant reduction in back pain Baseline: EVAL= 4/10; 11/15/2023= 5-6/10 at worst but feeling a little looser Goal status: ONGOING  3.  Patient will demonstrate improved cervical ROM by 15 deg each motion for improved head motion and ability to drive.  Baseline: EVAL- See cervical AROM section for values. 11/15/2023- See above Chart- slight improvement to date. Goal status: PROGRESSING   PLAN:  PT FREQUENCY: 1-2x/week  PT DURATION: 12 weeks  PLANNED INTERVENTIONS: 97164- PT Re-evaluation, 97110-Therapeutic exercises, 97530- Therapeutic activity, 97112- Neuromuscular re-education, 97535- Self Care, [id number]- Manual therapy, [phone]- Electrical stimulation (manual), Patient/Family education, Taping, Dry Needling, Joint mobilization, Joint manipulation, Spinal manipulation, Spinal mobilization, Cryotherapy, and Moist heat  PLAN FOR NEXT SESSION:  Manual therapy for pain relief and ROM,Progress in education in Cervical ROM and postural strengthening and add to HEP  next visit.   Murlene Army, PT 11/29/2023, 10:32 AM

## 2023-11-30 NOTE — Therapy (Signed)
 OUTPATIENT PHYSICAL THERAPY CERVICAL TREATMENT   Patient Name: Larry [name] MRN: [id number] DOB:August 14, 1950, 73 y.o., male Today's Date: 12/01/2023  END OF SESSION:  PT End of Session - 12/01/23 0920     Visit Number 15    Number of Visits 24    Date for PT Re-Evaluation 12/29/23    Authorization Type 4/8- 12/06/23- for 16 visits    Progress Note Due on Visit 20    PT Start Time 0930    PT Stop Time 1010    PT Time Calculation (min) 40 min    Activity Tolerance Patient tolerated treatment well;No increased pain    Behavior During Therapy WFL for tasks assessed/performed                    Past Medical History:  Diagnosis Date   ADD (attention deficit disorder)    Allergy    seasonal flares of perinneal allergies   Cancer (HCC)    melanoma   Cardiac arrest (HCC) 07/1997   aborted   Dementia (HCC)    post resusitative   Depression    Diverticulosis    Family hx of prostate cancer    GERD (gastroesophageal reflux disease)    Headache(784.0)    Hyperlipidemia    Hypothyroidism    ICD (implantable cardiac defibrillator) in place    Idiopathic urticaria    Internal hemorrhoids    Low back pain syndrome    Melanoma in situ of skin of trunk (HCC)    chest   PVC (premature ventricular contraction)    associated with cardiac arrest   Sleep apnea    no c-pap   Tubular adenoma of colon 10/2010   Past Surgical History:  Procedure Laterality Date    implantation x2     CARDIAC DEFIBRILLATOR PLACEMENT  07/01/2010   Explanation of a previously implanted device, pocket revision, and insertion of a new device, and intraoperative defibrillation threshold testing Rodolfo Clan)   CARDIAC DEFIBRILLATOR REMOVAL  07/05/2002   Defibrillator change Rodolfo Clan) 3 times changed   CATARACT EXTRACTION W/PHACO Left 02/04/2021   Procedure: CATARACT EXTRACTION PHACO AND INTRAOCULAR LENS PLACEMENT (IOC) LEFT VIVITY toric LENS 9.21 01:11.6;  Surgeon: Annell Kidney, MD;  Location:  [facility];  Service: Ophthalmology;  Laterality: Left;  keep patient at 11:30 arrival   CATARACT EXTRACTION W/PHACO Right 03/04/2021   Procedure: CATARACT EXTRACTION PHACO AND INTRAOCULAR LENS PLACEMENT (IOC) RIGHT VIVITY LENS 8.59 01:15.3;  Surgeon: Annell Kidney, MD;  Location: [facility];  Service: Ophthalmology;  Laterality: Right;   COLONOSCOPY     HEMORRHOID SURGERY     ICD     TONSILLECTOMY     Patient Active Problem List   Diagnosis Date Noted   Back pain 11/13/2023   Cervical dystonia 06/15/2022   Neck pain 07/02/2021   Gait abnormality 12/16/2020   Nonintractable headache 09/30/2020   Tinnitus 09/09/2019   History of sudden cardiac arrest 01/09/2019   Viral syndrome 09/06/2018   Seizures (HCC) 03/02/2018   Syncope 12/26/2017   Other social stressor 12/26/2017   Health care maintenance 08/14/2017   Family hx of prostate cancer    Allergy    Insomnia 10/20/2015   Advance care planning 03/08/2014   Memory loss 09/14/2013   Hypoxic brain injury (HCC) 09/14/2013   Medicare annual wellness visit, subsequent 02/16/2013   Shortness of breath 08/10/2011   Implantable cardioverter-defibrillator (ICD) in situ 02/03/2009   Hypothyroidism 11/18/2008   CONTACT DERMATITIS&OTHER ECZEMA DUE TO PLANTS  02/03/2008   IDIOPATHIC URTICARIA 02/03/2008   Hypertension 02/03/2008   HLD (hyperlipidemia) Oct 29, 2007   Attention deficit disorder 10/29/07   PVC/VT non sustained 10-29-07   Sleep apnea October 29, 2007   HEADACHE October 29, 2007   SUDDEN DEATH-aborted 2007/10/29   LOW BACK PAIN SYNDROME 10/11/2007    PCP: Dr. Richrd Char  REFERRING PROVIDER: Dr. Phebe Brasil  REFERRING DIAG:  G24.3 (ICD-10-CM) - Cervical dystonia  M54.2 (ICD-10-CM) - Neck pain    THERAPY DIAG:   Rationale for Evaluation and Treatment: Rehabilitation  ONSET DATE: 2020  SUBJECTIVE:                                                                                                                                                                                                          SUBJECTIVE STATEMENT: From Today: Patient reports feeling good overall today and states moving his head better.   From EVAL:  Patient reports having long standing neck tightness/pain. Does receive botox  injection with some relief yet very limited motion. If I try to move my neck- it just hurts.    Hand dominance: Right  PERTINENT HISTORY:  Anoxic brain injury in 1999 due to prolonged cardiac arrest,             With residual mild cognitive impairment             Slow worsening over the years             Continue Aricept  10 mg daily, Namenda  10 mg twice a day                 Seizure             Most recent in June 2019, EEG was normal             Continue lamotrigine  100 mg twice a day   Gait abnormality, worsening neck pain, limited range of motion, abnormal neck posturing, mild anterocollis, right tilt, significant tenderness along cervical paraspinal muscles, worse on the right side              CT cervical spine in June 2023, multilevel degenerative changes, severe right-sided C1-C2 osteoarthritis, variable degree of foraminal narrowing, no significant canal stenosis             He failed epidural injection, complains of increased confusion dizziness, unsteadiness with tizanidine, tramadol treatment,             Responded well to low-dose Botox  injection,             Refer to  physical therapy                EMG guided Botox  injection, used 100 units today (100 units dissolving to 2 cc of normal saline)   Right levator scapular 25 units Right longissimus capitis 12.5 units Right semispinalis 12.5 units Right splenius capitis 12.5 units Right splenius cervix 12.5 units Right iliocostalis 12.5 units Right upper trapezius 12.5 units   He tolerated injection well, will return to clinic in 3 months for repeat injection    PAIN:  Are you having pain? Yes: NPRS scale: 4/10 neck pain  R> L and upper trap and having some low back pain as well.   Pain location: posterior neck  Pain description: ache yet sharp Aggravating factors: active head motion, lifting,  Relieving factors: rest, Injections  PRECAUTIONS: Fall  RED FLAGS: None     WEIGHT BEARING RESTRICTIONS: No  FALLS:  Has patient fallen in last 6 months? Yes. Number of falls 3-4  LIVING ENVIRONMENT: Lives with: lives with their spouse Lives in: House/apartment Stairs: No Has following equipment at home: None  OCCUPATION: not working  PLOF: Independent  PATIENT GOALS: Move my neck better for safety and improve my pain  NEXT MD VISIT: unsure  OBJECTIVE:  Note: Objective measures were completed at Evaluation unless otherwise noted.  DIAGNOSTIC FINDINGS:  CLINICAL DATA:  Chronic, progressively worsening neck pain.   EXAM: CT CERVICAL SPINE WITHOUT CONTRAST   TECHNIQUE: Multidetector CT imaging of the cervical spine was performed without intravenous contrast. Multiplanar CT image reconstructions were also generated.   RADIATION DOSE REDUCTION: This exam was performed according to the departmental dose-optimization program which includes automated exposure control, adjustment of the mA and/or kV according to patient size and/or use of iterative reconstruction technique.   COMPARISON:  Cervical spine x-rays dated July 16, 2021.   FINDINGS: Alignment: Straightening of the normal cervical lordosis. Trace anterolisthesis at C3-C4 and C7-T1.   Skull base and vertebrae: No acute fracture. Severe right-sided C1-C2 osteoarthritis. No primary bone lesion or focal pathologic process. C4 vertebral bone island extending into the left posterior elements.   Soft tissues and spinal canal: No prevertebral fluid or swelling. No visible canal hematoma.   Disc levels:   C2-C3: Small central disc protrusion. Moderate right facet arthropathy. No stenosis.   C3-C4: Small central disc protrusion.  Mild bilateral uncovertebral hypertrophy. Moderate bilateral facet arthropathy. No stenosis.   C4-C5: Small posterior disc osteophyte complex. Moderate right and mild left uncovertebral hypertrophy. Moderate right facet arthropathy. Mild right neuroforaminal stenosis. No spinal canal or left neuroforaminal stenosis.   C5-C6: Small posterior disc osteophyte complex and moderate bilateral uncovertebral hypertrophy. No stenosis.   C6-C7: Small posterior disc osteophyte complex asymmetric to the right. Moderate left and mild right uncovertebral hypertrophy. Mild left neuroforaminal stenosis. No spinal canal or right neuroforaminal stenosis.   C7-T1: Negative disc. Mild bilateral facet uncovertebral hypertrophy. No stenosis.   Upper chest: Negative.   Other: None.   IMPRESSION: 1. Multilevel degenerative changes of the cervical spine as described above. No high-grade spinal canal or neuroforaminal stenosis. 2. Severe right-sided C1-C2 osteoarthritis.     Electronically Signed   By: Aleta Anda M.D.   On: 12/28/2021 13:55    PATIENT SURVEYS:  NDI 46%  COGNITION: Overall cognitive status: History of cognitive impairments - at baseline  SENSATION: WFL  POSTURE: Very stiff c-spine  PALPATION: (+) right sided tenderness occiput down to UT region   CERVICAL ROM:   Active ROM A/PROM (  deg) eval AROM 11/15/2023 AROM 11/17/2023 AROM  12/01/2023  Flexion 25   34  Extension 6   18  Right lateral flexion 8 18 23 26   Left lateral flexion 12 14 20 22   Right rotation 25 28 32 36  Left rotation 28 28 40 30   (Blank rows = not tested)  UPPER EXTREMITY ROM:  Active ROM Right eval Left eval  Shoulder flexion    Shoulder extension    Shoulder abduction    Shoulder adduction    Shoulder extension    Shoulder internal rotation    Shoulder external rotation    Elbow flexion    Elbow extension    Wrist flexion    Wrist extension    Wrist ulnar deviation    Wrist  radial deviation    Wrist pronation    Wrist supination     (Blank rows = not tested)  UPPER EXTREMITY MMT:  MMT Right eval Left eval  Shoulder flexion 2- 2-  Shoulder extension    Shoulder abduction 2- 2-  Shoulder adduction    Shoulder extension    Shoulder internal rotation    Shoulder external rotation    Middle trapezius    Lower trapezius    Elbow flexion 4 4  Elbow extension 4 4  Wrist flexion 4 4  Wrist extension 4 4  Wrist ulnar deviation    Wrist radial deviation    Wrist pronation 4 4  Wrist supination 4 4  Grip strength     (Blank rows = not tested)  CERVICAL SPECIAL TESTS:  Pain with all active movement - unable to test  FUNCTIONAL TESTS:  none  TREATMENT DATE: 12/01/23    Manual:   STM to cervical paraspinals (multifidus) and R UT x 8 min.  Gentle manual traction (cervical) hold 60 sec x 3 Occipital release - 60 sec hold x 3  THEREX: - AROM Cervical Flex/ext/SB/Rotation 20 reps each  NMR:  -Scap retraction (active)  3 x 10  -Posterior shoulder roll x 20 reps  Self management:  Review of wall push up and standing Y lift off from last visit- Min VC to perform correctly.  Also reviewed Chin tuck- seated and standing against wall        PATIENT EDUCATION:  Education details: Exercise technique; importance of postural training Person educated: Patient and Spouse Education method: Explanation, Demonstration, Tactile cues, and Verbal cues Education comprehension: verbalized understanding and returned demonstration  HOME EXERCISE PROGRAM:  Access Code: [email or web address] URL: [email or web address] Date: 11/29/2023 Prepared by: Ferrell Hu  Exercises - Wall Push Up  - 3 x weekly - 3 sets - 10 reps - Low Trap Setting at Wall  - 3 x weekly - 3 sets - 10 reps       Access Code: [email or web address] URL: [email or web address] Date: 11/24/2023 Prepared by: Ferrell Hu  Exercises - Seated Assisted Cervical  Rotation with Towel  - 1 x daily - 3 sets - 30 sec  hold - Cervical Extension AROM with Strap  - 1 x daily - 3 sets - 10 reps - Scapular Retraction with Resistance  - 3 x weekly - 3 sets - 10 reps - Shoulder extension with resistance - Neutral  - 3 x weekly - 3 sets - 10 reps - Standing Shoulder Horizontal Abduction with Resistance  - 3 x weekly - 3 sets - 10 reps      Access Code: [email or web address] URL: [email or web address] Date: 10/20/2023 Prepared by: Ferrell Hu  Exercises -  Standing Shoulder Row with Anchored Resistance  - 3 x weekly - 3 sets - 10-12 reps - Standing Row with Resistance with Anchored Resistance at Chest Height Palms Down  - 3 x weekly - 3 sets - 10-12 reps - Wall Angels  - 3 x weekly - 3 sets - 10 reps    Access Code: [email or web address] URL: [email or web address] Date: 10/11/2023 Prepared by: Ferrell Hu  Exercises - Supine Chin Tuck  - 1 x daily - 3 sets - 10 reps - Supine Deep Neck Flexor Training - Repetitions  - 1 x daily - 3 sets - 10 reps - Seated Cervical Retraction  - 1 x daily - 3 sets - 10 reps  ASSESSMENT:  CLINICAL IMPRESSION: Patient is a 73 y.o. male who was seen today for physical therapy treatment for cervical dystonia and neck pain. Treatment focused more on ROM and manual techniques then measured his AROM. He is demonstrating improved cervical ROM than initial eval and reports feeling better with improved ability to drive.  Patient will benefit from skilled PT to assist with pain relief, ROM and improved functional mobility for optimal quality of life.    OBJECTIVE IMPAIRMENTS: Abnormal gait, decreased balance, decreased ROM, decreased strength, hypomobility, impaired flexibility, impaired UE functional use, and pain.   ACTIVITY LIMITATIONS: carrying, lifting, bending, and sleeping  PARTICIPATION LIMITATIONS: meal prep, cleaning, laundry, driving, shopping, community activity, and yard work  PERSONAL FACTORS: 1  comorbidity: anoxic brain injury are also affecting patient's functional outcome.   REHAB POTENTIAL: Good  CLINICAL DECISION MAKING: Stable/uncomplicated  EVALUATION COMPLEXITY: Low   GOALS: Goals reviewed with patient? Yes  SHORT TERM GOALS: Target date: 11/17/2023  Pt will be independent with HEP in order to improve strength and decrease back pain in order to improve pain-free function at home and work. Baseline: EVAL- No formal HEP in place; 11/15/2023= Patient reports more compliant with current HEP- and using heat prior to exercises.   Goal status: MET  LONG TERM GOALS: Target date: 12/29/2023  Pt will demonstrate decrease in NDI by at least 19% in order to demonstrate clinically significant reduction in disability related to neck injury/pain Baseline: EVAL= 46% Goal status: INITIAL  2.  Pt will decrease worst neck pain as reported on NPRS by at least 2 points in order to demonstrate clinically significant reduction in back pain Baseline: EVAL= 4/10; 11/15/2023= 5-6/10 at worst but feeling a little looser Goal status: ONGOING  3.  Patient will demonstrate improved cervical ROM by 15 deg each motion for improved head motion and ability to drive.  Baseline: EVAL- See cervical AROM section for values. 11/15/2023- See above Chart- slight improvement to date. Goal status: PROGRESSING   PLAN:  PT FREQUENCY: 1-2x/week  PT DURATION: 12 weeks  PLANNED INTERVENTIONS: 97164- PT Re-evaluation, 97110-Therapeutic exercises, 97530- Therapeutic activity, 97112- Neuromuscular re-education, 97535- Self Care, [id number]- Manual therapy, [phone]- Electrical stimulation (manual), Patient/Family education, Taping, Dry Needling, Joint mobilization, Joint manipulation, Spinal manipulation, Spinal mobilization, Cryotherapy, and Moist heat  PLAN FOR NEXT SESSION:  Manual therapy for pain relief and ROM,Progress in education in Cervical ROM and postural strengthening and add to HEP next visit.   Murlene Army, PT 12/01/2023, 10:44 AM

## 2023-12-01 ENCOUNTER — Ambulatory Visit

## 2023-12-01 DIAGNOSIS — M6281 Muscle weakness (generalized): Secondary | ICD-10-CM

## 2023-12-01 DIAGNOSIS — M5382 Other specified dorsopathies, cervical region: Secondary | ICD-10-CM

## 2023-12-01 DIAGNOSIS — M542 Cervicalgia: Secondary | ICD-10-CM | POA: Diagnosis not present

## 2023-12-06 ENCOUNTER — Ambulatory Visit: Attending: Neurology

## 2023-12-06 DIAGNOSIS — M5442 Lumbago with sciatica, left side: Secondary | ICD-10-CM | POA: Diagnosis not present

## 2023-12-06 DIAGNOSIS — G8929 Other chronic pain: Secondary | ICD-10-CM | POA: Diagnosis not present

## 2023-12-06 DIAGNOSIS — R269 Unspecified abnormalities of gait and mobility: Secondary | ICD-10-CM | POA: Diagnosis not present

## 2023-12-06 DIAGNOSIS — M5382 Other specified dorsopathies, cervical region: Secondary | ICD-10-CM | POA: Diagnosis not present

## 2023-12-06 DIAGNOSIS — R278 Other lack of coordination: Secondary | ICD-10-CM | POA: Insufficient documentation

## 2023-12-06 DIAGNOSIS — R2681 Unsteadiness on feet: Secondary | ICD-10-CM | POA: Insufficient documentation

## 2023-12-06 DIAGNOSIS — M542 Cervicalgia: Secondary | ICD-10-CM | POA: Diagnosis not present

## 2023-12-06 DIAGNOSIS — M6281 Muscle weakness (generalized): Secondary | ICD-10-CM | POA: Insufficient documentation

## 2023-12-06 DIAGNOSIS — R296 Repeated falls: Secondary | ICD-10-CM | POA: Diagnosis not present

## 2023-12-06 DIAGNOSIS — R262 Difficulty in walking, not elsewhere classified: Secondary | ICD-10-CM | POA: Diagnosis not present

## 2023-12-06 DIAGNOSIS — R2689 Other abnormalities of gait and mobility: Secondary | ICD-10-CM | POA: Diagnosis not present

## 2023-12-06 NOTE — Therapy (Signed)
 OUTPATIENT PHYSICAL THERAPY CERVICAL TREATMENT/PT DISCHARGE SUMMARY   Patient Name: Larry [name] MRN: [id number] DOB:[birth year], 73 y.o., male Today's Date: 12/07/2023  END OF SESSION:  PT End of Session - 12/06/23 0947     Visit Number 16    Number of Visits 24    Date for PT Re-Evaluation 12/29/23    Authorization Type 4/8- 12/06/23- for 16 visits    Progress Note Due on Visit 20    PT Start Time 0934    PT Stop Time 1014    PT Time Calculation (min) 40 min    Activity Tolerance Patient tolerated treatment well;No increased pain    Behavior During Therapy WFL for tasks assessed/performed                     Past Medical History:  Diagnosis Date   ADD (attention deficit disorder)    Allergy    seasonal flares of perinneal allergies   Cancer (HCC)    melanoma   Cardiac arrest (HCC) 07/1997   aborted   Dementia (HCC)    post resusitative   Depression    Diverticulosis    Family hx of prostate cancer    GERD (gastroesophageal reflux disease)    Headache(784.0)    Hyperlipidemia    Hypothyroidism    ICD (implantable cardiac defibrillator) in place    Idiopathic urticaria    Internal hemorrhoids    Low back pain syndrome    Melanoma in situ of skin of trunk (HCC)    chest   PVC (premature ventricular contraction)    associated with cardiac arrest   Sleep apnea    no c-pap   Tubular adenoma of colon 10/2010   Past Surgical History:  Procedure Laterality Date    implantation x2     CARDIAC DEFIBRILLATOR PLACEMENT  07/01/2010   Explanation of a previously implanted device, pocket revision, and insertion of a new device, and intraoperative defibrillation threshold testing Rodolfo Clan)   CARDIAC DEFIBRILLATOR REMOVAL  07/05/2002   Defibrillator change Rodolfo Clan) 3 times changed   CATARACT EXTRACTION W/PHACO Left 02/04/2021   Procedure: CATARACT EXTRACTION PHACO AND INTRAOCULAR LENS PLACEMENT (IOC) LEFT VIVITY toric LENS 9.21 01:11.6;  Surgeon: Annell Kidney, MD;  Location: [facility];  Service: Ophthalmology;  Laterality: Left;  keep patient at 11:30 arrival   CATARACT EXTRACTION W/PHACO Right 03/04/2021   Procedure: CATARACT EXTRACTION PHACO AND INTRAOCULAR LENS PLACEMENT (IOC) RIGHT VIVITY LENS 8.59 01:15.3;  Surgeon: Annell Kidney, MD;  Location: [facility];  Service: Ophthalmology;  Laterality: Right;   COLONOSCOPY     HEMORRHOID SURGERY     ICD     TONSILLECTOMY     Patient Active Problem List   Diagnosis Date Noted   Back pain 11/13/2023   Cervical dystonia 06/15/2022   Neck pain 07/02/2021   Gait abnormality 12/16/2020   Nonintractable headache 09/30/2020   Tinnitus 09/09/2019   History of sudden cardiac arrest 01/09/2019   Viral syndrome 09/06/2018   Seizures (HCC) 03/02/2018   Syncope 12/26/2017   Other social stressor 12/26/2017   Health care maintenance 08/14/2017   Family hx of prostate cancer    Allergy    Insomnia 10/20/2015   Advance care planning 03/08/2014   Memory loss 09/14/2013   Hypoxic brain injury (HCC) 09/14/2013   Medicare annual wellness visit, subsequent 02/16/2013   Shortness of breath 08/10/2011   Implantable cardioverter-defibrillator (ICD) in situ 02/03/2009   Hypothyroidism 11/18/2008   CONTACT DERMATITIS&OTHER ECZEMA  DUE TO PLANTS 02/03/2008   IDIOPATHIC URTICARIA 02/03/2008   Hypertension 02/03/2008   HLD (hyperlipidemia) 09-Nov-2007   Attention deficit disorder 11/09/07   PVC/VT non sustained 2007-11-09   Sleep apnea 11-09-07   HEADACHE 11/09/2007   SUDDEN DEATH-aborted 11/09/2007   LOW BACK PAIN SYNDROME 10/11/2007    PCP: Dr. Richrd Char  REFERRING PROVIDER: Dr. Phebe Brasil  REFERRING DIAG:  G24.3 (ICD-10-CM) - Cervical dystonia  M54.2 (ICD-10-CM) - Neck pain    THERAPY DIAG:   Rationale for Evaluation and Treatment: Rehabilitation  ONSET DATE: 2020  SUBJECTIVE:                                                                                                                                                                                                          SUBJECTIVE STATEMENT: From Today: Patient reports still having neck tightness but much improved. "I know its a long process but I feel like I know the exercises and should be able to continue at home with the exercises I have learned here."  Patient reports having another referral for gait instability and low back pain and would like to transition from neck diagnosis to new problems after today.   From EVAL:  Patient reports having long standing neck tightness/pain. Does receive botox  injection with some relief yet very limited motion. If I try to move my neck- it just hurts.    Hand dominance: Right  PERTINENT HISTORY:  Anoxic brain injury in 1999 due to prolonged cardiac arrest,             With residual mild cognitive impairment             Slow worsening over the years             Continue Aricept  10 mg daily, Namenda  10 mg twice a day                 Seizure             Most recent in June 2019, EEG was normal             Continue lamotrigine  100 mg twice a day   Gait abnormality, worsening neck pain, limited range of motion, abnormal neck posturing, mild anterocollis, right tilt, significant tenderness along cervical paraspinal muscles, worse on the right side              CT cervical spine in June 2023, multilevel degenerative changes, severe right-sided C1-C2 osteoarthritis, variable degree of foraminal narrowing, no significant canal stenosis  He failed epidural injection, complains of increased confusion dizziness, unsteadiness with tizanidine, tramadol treatment,             Responded well to low-dose Botox  injection,             Refer to physical therapy                EMG guided Botox  injection, used 100 units today (100 units dissolving to 2 cc of normal saline)   Right levator scapular 25 units Right longissimus capitis 12.5 units Right  semispinalis 12.5 units Right splenius capitis 12.5 units Right splenius cervix 12.5 units Right iliocostalis 12.5 units Right upper trapezius 12.5 units   He tolerated injection well, will return to clinic in 3 months for repeat injection    PAIN:  Are you having pain? Yes: NPRS scale: 4/10 neck pain R> L and upper trap and having some low back pain as well.   Pain location: posterior neck  Pain description: ache yet sharp Aggravating factors: active head motion, lifting,  Relieving factors: rest, Injections  PRECAUTIONS: Fall  RED FLAGS: None     WEIGHT BEARING RESTRICTIONS: No  FALLS:  Has patient fallen in last 6 months? Yes. Number of falls 3-4  LIVING ENVIRONMENT: Lives with: lives with their spouse Lives in: House/apartment Stairs: No Has following equipment at home: None  OCCUPATION: not working  PLOF: Independent  PATIENT GOALS: Move my neck better for safety and improve my pain  NEXT MD VISIT: unsure  OBJECTIVE:  Note: Objective measures were completed at Evaluation unless otherwise noted.  DIAGNOSTIC FINDINGS:  CLINICAL DATA:  Chronic, progressively worsening neck pain.   EXAM: CT CERVICAL SPINE WITHOUT CONTRAST   TECHNIQUE: Multidetector CT imaging of the cervical spine was performed without intravenous contrast. Multiplanar CT image reconstructions were also generated.   RADIATION DOSE REDUCTION: This exam was performed according to the departmental dose-optimization program which includes automated exposure control, adjustment of the mA and/or kV according to patient size and/or use of iterative reconstruction technique.   COMPARISON:  Cervical spine x-rays dated July 16, 2021.   FINDINGS: Alignment: Straightening of the normal cervical lordosis. Trace anterolisthesis at C3-C4 and C7-T1.   Skull base and vertebrae: No acute fracture. Severe right-sided C1-C2 osteoarthritis. No primary bone lesion or focal pathologic process. C4  vertebral bone island extending into the left posterior elements.   Soft tissues and spinal canal: No prevertebral fluid or swelling. No visible canal hematoma.   Disc levels:   C2-C3: Small central disc protrusion. Moderate right facet arthropathy. No stenosis.   C3-C4: Small central disc protrusion. Mild bilateral uncovertebral hypertrophy. Moderate bilateral facet arthropathy. No stenosis.   C4-C5: Small posterior disc osteophyte complex. Moderate right and mild left uncovertebral hypertrophy. Moderate right facet arthropathy. Mild right neuroforaminal stenosis. No spinal canal or left neuroforaminal stenosis.   C5-C6: Small posterior disc osteophyte complex and moderate bilateral uncovertebral hypertrophy. No stenosis.   C6-C7: Small posterior disc osteophyte complex asymmetric to the right. Moderate left and mild right uncovertebral hypertrophy. Mild left neuroforaminal stenosis. No spinal canal or right neuroforaminal stenosis.   C7-T1: Negative disc. Mild bilateral facet uncovertebral hypertrophy. No stenosis.   Upper chest: Negative.   Other: None.   IMPRESSION: 1. Multilevel degenerative changes of the cervical spine as described above. No high-grade spinal canal or neuroforaminal stenosis. 2. Severe right-sided C1-C2 osteoarthritis.     Electronically Signed   By: Aleta Anda M.D.   On: 12/28/2021 13:55  PATIENT SURVEYS:  NDI 46%  COGNITION: Overall cognitive status: History of cognitive impairments - at baseline  SENSATION: WFL  POSTURE: Very stiff c-spine  PALPATION: (+) right sided tenderness occiput down to UT region   CERVICAL ROM:   Active ROM A/PROM (deg) eval AROM 11/15/2023 AROM 11/17/2023 AROM  12/01/2023 AROM 12/06/2023  Flexion 25   34 33  Extension 6   18 20   Right lateral flexion 8 18 23 26 28   Left lateral flexion 12 14 20 22 26   Right rotation 25 28 32 36 35  Left rotation 28 28 40 30 32   (Blank rows = not  tested)  UPPER EXTREMITY ROM:  Active ROM Right eval Left eval  Shoulder flexion    Shoulder extension    Shoulder abduction    Shoulder adduction    Shoulder extension    Shoulder internal rotation    Shoulder external rotation    Elbow flexion    Elbow extension    Wrist flexion    Wrist extension    Wrist ulnar deviation    Wrist radial deviation    Wrist pronation    Wrist supination     (Blank rows = not tested)  UPPER EXTREMITY MMT:  MMT Right eval Left eval  Shoulder flexion 2- 2-  Shoulder extension    Shoulder abduction 2- 2-  Shoulder adduction    Shoulder extension    Shoulder internal rotation    Shoulder external rotation    Middle trapezius    Lower trapezius    Elbow flexion 4 4  Elbow extension 4 4  Wrist flexion 4 4  Wrist extension 4 4  Wrist ulnar deviation    Wrist radial deviation    Wrist pronation 4 4  Wrist supination 4 4  Grip strength     (Blank rows = not tested)  CERVICAL SPECIAL TESTS:  Pain with all active movement - unable to test  FUNCTIONAL TESTS:  none  TREATMENT DATE: 12/07/23   Physical therapy treatment session today consisted of completing assessment of goals and administration of testing as demonstrated and documented in flow sheet, treatment, and goals section of this note. Addition treatments may be found below.   NDI= 26% PAIN= 3/10 cervical pain Cervical ROM: See above section  Manual:   STM to cervical paraspinals (multifidus) and R UT x 8 min.  Gentle manual traction (cervical) hold 60 sec x 3 Occipital release - 60 sec hold x 3  THEREX: - AROM Cervical Flex/ext/SB/Rotation 20 reps each  Self management:  Review of finalized HEP for cervical spine and posture.  Discussed all below activities       PATIENT EDUCATION:  Education details: Exercise technique with HEP.  Person educated: Patient and Spouse Education method: Explanation, Demonstration, Tactile cues, and Verbal cues Education  comprehension: verbalized understanding and returned demonstration  HOME EXERCISE PROGRAM:  Access Code: [email or web address] URL: [email or web address] Date: 11/29/2023 Prepared by: Ferrell Hu  Exercises - Wall Push Up  - 3 x weekly - 3 sets - 10 reps - Low Trap Setting at Wall  - 3 x weekly - 3 sets - 10 reps       Access Code: [email or web address] URL: [email or web address] Date: 11/24/2023 Prepared by: Ferrell Hu  Exercises - Seated Assisted Cervical Rotation with Towel  - 1 x daily - 3 sets - 30 sec  hold - Cervical Extension AROM with Strap  - 1 x daily - 3 sets - 10 reps - Scapular Retraction  with Resistance  - 3 x weekly - 3 sets - 10 reps - Shoulder extension with resistance - Neutral  - 3 x weekly - 3 sets - 10 reps - Standing Shoulder Horizontal Abduction with Resistance  - 3 x weekly - 3 sets - 10 reps      Access Code: [email or web address] URL: [email or web address] Date: 10/20/2023 Prepared by: Ferrell Hu  Exercises - Standing Shoulder Row with Anchored Resistance  - 3 x weekly - 3 sets - 10-12 reps - Standing Row with Resistance with Anchored Resistance at Chest Height Palms Down  - 3 x weekly - 3 sets - 10-12 reps - Wall Angels  - 3 x weekly - 3 sets - 10 reps    Access Code: [email or web address] URL: [email or web address] Date: 10/11/2023 Prepared by: Ferrell Hu  Exercises - Supine Chin Tuck  - 1 x daily - 3 sets - 10 reps - Supine Deep Neck Flexor Training - Repetitions  - 1 x daily - 3 sets - 10 reps - Seated Cervical Retraction  - 1 x daily - 3 sets - 10 reps  ASSESSMENT:  CLINICAL IMPRESSION: Patient presents with improving pain overall. He has a chronic condition and continues to present with ongoing cervical tightness yet has learned some exercises to assist long term. He has made progress with some reported pain reduction and much improved overall cervical mobility and functional abilities  including reporting improved driving and just turning his head in general. He met 2/3 goals and appropriate for discharge. He will continue to work on cervical HEP at home. He has another referral for gait abnormality and low back and will begin PT for those issues in a couple of weeks after he returns from vacation.     OBJECTIVE IMPAIRMENTS: Abnormal gait, decreased balance, decreased ROM, decreased strength, hypomobility, impaired flexibility, impaired UE functional use, and pain.   ACTIVITY LIMITATIONS: carrying, lifting, bending, and sleeping  PARTICIPATION LIMITATIONS: meal prep, cleaning, laundry, driving, shopping, community activity, and yard work  PERSONAL FACTORS: 1 comorbidity: anoxic brain injury are also affecting patient's functional outcome.   REHAB POTENTIAL: Good  CLINICAL DECISION MAKING: Stable/uncomplicated  EVALUATION COMPLEXITY: Low   GOALS: Goals reviewed with patient? Yes  SHORT TERM GOALS: Target date: 11/17/2023  Pt will be independent with HEP in order to improve strength and decrease back pain in order to improve pain-free function at home and work. Baseline: EVAL- No formal HEP in place; 11/15/2023= Patient reports more compliant with current HEP- and using heat prior to exercises.   Goal status: MET  LONG TERM GOALS: Target date: 12/29/2023  Pt will demonstrate decrease in NDI by at least 19% in order to demonstrate clinically significant reduction in disability related to neck injury/pain Baseline: EVAL= 46%; 12/06/2023= 26% Goal status: MET  2.  Pt will decrease worst neck pain as reported on NPRS by at least 2 points in order to demonstrate clinically significant reduction in back pain Baseline: EVAL= 4/10; 11/15/2023= 5-6/10 at worst but feeling a little looser; 12/06/2023= 3/10 Goal status: PROGRESSING BUT NOT MET  3.  Patient will demonstrate improved cervical ROM by 15 deg each motion for improved head motion and ability to drive.  Baseline: EVAL-  See cervical AROM section for values. 11/15/2023- See above Chart- slight improvement to date. 12/06/2023 Goal status: PROGRESSING   PLAN:  PT FREQUENCY: 1-2x/week  PT DURATION: 12 weeks  PLANNED INTERVENTIONS: 97164- PT Re-evaluation, 97110-Therapeutic exercises, 97530- Therapeutic activity, 97112- Neuromuscular re-education, 97535- Self Care, 36644- Manual therapy,  16109- Electrical stimulation (manual), Patient/Family education, Taping, Dry Needling, Joint mobilization, Joint manipulation, Spinal manipulation, Spinal mobilization, Cryotherapy, and Moist heat  PLAN FOR NEXT SESSION:   Discharge patient today.    Murlene Army, PT 12/07/2023, 10:37 AM

## 2023-12-08 ENCOUNTER — Ambulatory Visit

## 2023-12-12 ENCOUNTER — Ambulatory Visit

## 2023-12-13 ENCOUNTER — Ambulatory Visit

## 2023-12-15 ENCOUNTER — Ambulatory Visit

## 2023-12-19 ENCOUNTER — Ambulatory Visit

## 2023-12-19 NOTE — Therapy (Incomplete)
 OUTPATIENT PHYSICAL THERAPY NEURO EVALUATION   Patient Name: Larry [name] MRN: [id number] DOB:10/22/1950, 73 y.o., male Today's Date: 12/19/2023   PCP: Dr. Richrd Char REFERRING PROVIDER: Dr. Richrd Char  END OF SESSION:   Past Medical History:  Diagnosis Date   ADD (attention deficit disorder)    Allergy    seasonal flares of perinneal allergies   Cancer (HCC)    melanoma   Cardiac arrest (HCC) 07/1997   aborted   Dementia (HCC)    post resusitative   Depression    Diverticulosis    Family hx of prostate cancer    GERD (gastroesophageal reflux disease)    Headache(784.0)    Hyperlipidemia    Hypothyroidism    ICD (implantable cardiac defibrillator) in place    Idiopathic urticaria    Internal hemorrhoids    Low back pain syndrome    Melanoma in situ of skin of trunk (HCC)    chest   PVC (premature ventricular contraction)    associated with cardiac arrest   Sleep apnea    no c-pap   Tubular adenoma of colon 10/2010   Past Surgical History:  Procedure Laterality Date    implantation x2     CARDIAC DEFIBRILLATOR PLACEMENT  07/01/2010   Explanation of a previously implanted device, pocket revision, and insertion of a new device, and intraoperative defibrillation threshold testing Rodolfo Clan)   CARDIAC DEFIBRILLATOR REMOVAL  07/05/2002   Defibrillator change Rodolfo Clan) 3 times changed   CATARACT EXTRACTION W/PHACO Left 02/04/2021   Procedure: CATARACT EXTRACTION PHACO AND INTRAOCULAR LENS PLACEMENT (IOC) LEFT VIVITY toric LENS 9.21 01:11.6;  Surgeon: Annell Kidney, MD;  Location: [facility];  Service: Ophthalmology;  Laterality: Left;  keep patient at 11:30 arrival   CATARACT EXTRACTION W/PHACO Right 03/04/2021   Procedure: CATARACT EXTRACTION PHACO AND INTRAOCULAR LENS PLACEMENT (IOC) RIGHT VIVITY LENS 8.59 01:15.3;  Surgeon: Annell Kidney, MD;  Location: [facility];  Service: Ophthalmology;  Laterality: Right;   COLONOSCOPY      HEMORRHOID SURGERY     ICD     TONSILLECTOMY     Patient Active Problem List   Diagnosis Date Noted   Back pain 11/13/2023   Cervical dystonia 06/15/2022   Neck pain 07/02/2021   Gait abnormality 12/16/2020   Nonintractable headache 09/30/2020   Tinnitus 09/09/2019   History of sudden cardiac arrest 01/09/2019   Viral syndrome 09/06/2018   Seizures (HCC) 03/02/2018   Syncope 12/26/2017   Other social stressor 12/26/2017   Health care maintenance 08/14/2017   Family hx of prostate cancer    Allergy    Insomnia 10/20/2015   Advance care planning 03/08/2014   Memory loss 09/14/2013   Hypoxic brain injury (HCC) 09/14/2013   Medicare annual wellness visit, subsequent 02/16/2013   Shortness of breath 08/10/2011   Implantable cardioverter-defibrillator (ICD) in situ 02/03/2009   Hypothyroidism 11/18/2008   CONTACT DERMATITIS&OTHER ECZEMA DUE TO PLANTS 02/03/2008   IDIOPATHIC URTICARIA 02/03/2008   Hypertension 02/03/2008   HLD (hyperlipidemia) Nov 06, 2007   Attention deficit disorder 11/06/2007   PVC/VT non sustained 2007/11/06   Sleep apnea 2007-11-06   HEADACHE 11/06/07   SUDDEN DEATH-aborted 06-Nov-2007   LOW BACK PAIN SYNDROME 10/11/2007    ONSET DATE: ***  REFERRING DIAG:  Z91.81 (ICD-10-CM) - History of fall  R26.9 (ICD-10-CM) - Gait abnormality  M54.9 (ICD-10-CM) - Back pain, unspecified back location, unspecified back pain laterality, unspecified chronicity    THERAPY DIAG:  No diagnosis found.  Rationale for Evaluation and  Treatment: Rehabilitation  SUBJECTIVE:                                                                                                                                                                                             SUBJECTIVE STATEMENT: *** Pt accompanied by: {accompnied:27141}  PERTINENT HISTORY: ***  PAIN:  Are you having pain? {OPRCPAIN:27236}  PRECAUTIONS: {Therapy precautions:24002}  RED FLAGS: {PT Red  Flags:29287}   WEIGHT BEARING RESTRICTIONS: No  FALLS: Has patient fallen in last 6 months? Yes. Number of falls ***  LIVING ENVIRONMENT: Lives with: lives with their spouse Lives in: House/apartment Stairs: No Has following equipment at home: None  PLOF: Independent  PATIENT GOALS: ***  OBJECTIVE:  Note: Objective measures were completed at Evaluation unless otherwise noted.  DIAGNOSTIC FINDINGS: None recent  COGNITION: Overall cognitive status: History of cognitive impairments - at baseline   SENSATION: Light touch: WFL  COORDINATION: ***  EDEMA:  {edema:24020}  MUSCLE TONE: {LE tone:25568}  MUSCLE LENGTH: Hamstrings: Right *** deg; Left *** deg Andy Bannister test: Right *** deg; Left *** deg  DTRs:  {DTR SITE:24025}  POSTURE: {posture:25561}  LOWER EXTREMITY ROM:     {AROM/PROM:27142}  Right Eval Left Eval  Hip flexion    Hip extension    Hip abduction    Hip adduction    Hip internal rotation    Hip external rotation    Knee flexion    Knee extension    Ankle dorsiflexion    Ankle plantarflexion    Ankle inversion    Ankle eversion     (Blank rows = not tested)  LOWER EXTREMITY MMT:    MMT Right Eval Left Eval  Hip flexion    Hip extension    Hip abduction    Hip adduction    Hip internal rotation    Hip external rotation    Knee flexion    Knee extension    Ankle dorsiflexion    Ankle plantarflexion    Ankle inversion    Ankle eversion    (Blank rows = not tested)  BED MOBILITY:  {bed mobility:32615:p}  TRANSFERS: {transfers eval:32620}  RAMP:  {ramp eval:32616}  CURB:  {curb eval:32617}  STAIRS: {stairs eval:32618} GAIT: Findings: {GaitneuroPT:32644::Distance walked: ***,Comments: ***}  FUNCTIONAL TESTS:  {Functional tests:24029}  PATIENT SURVEYS:  {rehab surveys:24030}  TREATMENT DATE:  ***    PATIENT EDUCATION: Education details: *** Person educated: {Person educated:25204} Education method: {Education Method:25205} Education comprehension: {Education Comprehension:25206}  HOME EXERCISE PROGRAM: ***  GOALS: Goals reviewed with patient? {yes/no:20286}  SHORT TERM GOALS: Target date: ***  *** Baseline: Goal status: INITIAL  2.  *** Baseline:  Goal status: INITIAL  3.  *** Baseline:  Goal status: INITIAL  4.  *** Baseline:  Goal status: INITIAL  5.  *** Baseline:  Goal status: INITIAL  6.  *** Baseline:  Goal status: INITIAL  LONG TERM GOALS: Target date: ***  ***.  Patient will complete five times sit to stand test in < 15 seconds indicating an increased LE strength and improved balance. Baseline: *** Goal status: INITIAL  ***.  Patient will improve *** score to ***   to demonstrate statistically significant improvement in mobility and quality of life as it relates to their ***.  Baseline: *** Goal status: INITIAL   ***.  Patient will increase Berg Balance score by > 6 points to demonstrate decreased fall risk during functional activities. Baseline: *** Goal status: INITIAL   ***.   Patient will reduce timed up and go to <11 seconds to reduce fall risk and demonstrate improved transfer/gait ability. Baseline: *** Goal status: INITIAL  ***.   Patient will increase 10 meter walk test to >1.53m/s as to improve gait speed for better community ambulation and to reduce fall risk. Baseline: *** Goal status: INITIAL  ***.   Patient will increase six minute walk test distance to >1000 for progression to community ambulator and improve gait ability Baseline: *** Goal status: INITIAL     ASSESSMENT:  CLINICAL IMPRESSION: Patient is a 73 y.o. male who was seen today for physical therapy evaluation and treatment for ***.   OBJECTIVE IMPAIRMENTS: {opptimpairments:25111}.   ACTIVITY LIMITATIONS:  {activitylimitations:27494}  PARTICIPATION LIMITATIONS: {participationrestrictions:25113}  PERSONAL FACTORS: {Personal factors:25162} are also affecting patient's functional outcome.   REHAB POTENTIAL: {rehabpotential:25112}  CLINICAL DECISION MAKING: {clinical decision making:25114}  EVALUATION COMPLEXITY: {Evaluation complexity:25115}  PLAN:  PT FREQUENCY: {rehab frequency:25116}  PT DURATION: {rehab duration:25117}  PLANNED INTERVENTIONS: {rehab planned interventions:25118::97110-Therapeutic exercises,97530- Therapeutic [phone]- Neuromuscular re-education,97535- Self JXBJ,47829- Manual therapy}  PLAN FOR NEXT SESSION: ***   Murlene Army, PT 12/19/2023, 1:39 PM

## 2023-12-20 ENCOUNTER — Ambulatory Visit

## 2023-12-22 ENCOUNTER — Ambulatory Visit

## 2023-12-22 DIAGNOSIS — M6281 Muscle weakness (generalized): Secondary | ICD-10-CM | POA: Diagnosis not present

## 2023-12-22 DIAGNOSIS — M5442 Lumbago with sciatica, left side: Secondary | ICD-10-CM | POA: Diagnosis not present

## 2023-12-22 DIAGNOSIS — R269 Unspecified abnormalities of gait and mobility: Secondary | ICD-10-CM

## 2023-12-22 DIAGNOSIS — R262 Difficulty in walking, not elsewhere classified: Secondary | ICD-10-CM | POA: Diagnosis not present

## 2023-12-22 DIAGNOSIS — M542 Cervicalgia: Secondary | ICD-10-CM | POA: Diagnosis not present

## 2023-12-22 DIAGNOSIS — M5382 Other specified dorsopathies, cervical region: Secondary | ICD-10-CM | POA: Diagnosis not present

## 2023-12-22 DIAGNOSIS — R2681 Unsteadiness on feet: Secondary | ICD-10-CM

## 2023-12-22 DIAGNOSIS — R278 Other lack of coordination: Secondary | ICD-10-CM

## 2023-12-22 DIAGNOSIS — G8929 Other chronic pain: Secondary | ICD-10-CM

## 2023-12-22 DIAGNOSIS — R2689 Other abnormalities of gait and mobility: Secondary | ICD-10-CM | POA: Diagnosis not present

## 2023-12-22 DIAGNOSIS — R296 Repeated falls: Secondary | ICD-10-CM | POA: Diagnosis not present

## 2023-12-22 NOTE — Therapy (Signed)
 OUTPATIENT PHYSICAL THERAPY NEURO AND LOW BACK EVALUATION   Patient Name: Larry [name] MRN: [id number] DOB:10/13/1950, 73 y.o., male Today's Date: 12/23/2023   PCP: Dr. Arlyss Solian REFERRING PROVIDER: Dr. Arlyss Solian  END OF SESSION:  PT End of Session - 12/23/23 1034     Visit Number 1    Number of Visits 24    Date for PT Re-Evaluation 03/15/24    Progress Note Due on Visit 10    PT Start Time 0930    PT Stop Time 1014    PT Time Calculation (min) 44 min    Equipment Utilized During Treatment Gait belt    Activity Tolerance Patient tolerated treatment well    Behavior During Therapy WFL for tasks assessed/performed          Past Medical History:  Diagnosis Date   ADD (attention deficit disorder)    Allergy    seasonal flares of perinneal allergies   Cancer (HCC)    melanoma   Cardiac arrest (HCC) 07/1997   aborted   Dementia (HCC)    post resusitative   Depression    Diverticulosis    Family hx of prostate cancer    GERD (gastroesophageal reflux disease)    Headache(784.0)    Hyperlipidemia    Hypothyroidism    ICD (implantable cardiac defibrillator) in place    Idiopathic urticaria    Internal hemorrhoids    Low back pain syndrome    Melanoma in situ of skin of trunk (HCC)    chest   PVC (premature ventricular contraction)    associated with cardiac arrest   Sleep apnea    no c-pap   Tubular adenoma of colon 10/2010   Past Surgical History:  Procedure Laterality Date    implantation x2     CARDIAC DEFIBRILLATOR PLACEMENT  07/01/2010   Explanation of a previously implanted device, pocket revision, and insertion of a new device, and intraoperative defibrillation threshold testing Robyne)   CARDIAC DEFIBRILLATOR REMOVAL  07/05/2002   Defibrillator change Robyne) 3 times changed   CATARACT EXTRACTION W/PHACO Left 02/04/2021   Procedure: CATARACT EXTRACTION PHACO AND INTRAOCULAR LENS PLACEMENT (IOC) LEFT VIVITY toric LENS 9.21 01:11.6;   Surgeon: Mittie Gaskin, MD;  Location: [facility];  Service: Ophthalmology;  Laterality: Left;  keep patient at 11:30 arrival   CATARACT EXTRACTION W/PHACO Right 03/04/2021   Procedure: CATARACT EXTRACTION PHACO AND INTRAOCULAR LENS PLACEMENT (IOC) RIGHT VIVITY LENS 8.59 01:15.3;  Surgeon: Mittie Gaskin, MD;  Location: [facility];  Service: Ophthalmology;  Laterality: Right;   COLONOSCOPY     HEMORRHOID SURGERY     ICD     TONSILLECTOMY     Patient Active Problem List   Diagnosis Date Noted   Back pain 11/13/2023   Cervical dystonia 06/15/2022   Neck pain 07/02/2021   Gait abnormality 12/16/2020   Nonintractable headache 09/30/2020   Tinnitus 09/09/2019   History of sudden cardiac arrest 01/09/2019   Viral syndrome 09/06/2018   Seizures (HCC) 03/02/2018   Syncope 12/26/2017   Other social stressor 12/26/2017   Health care maintenance 08/14/2017   Family hx of prostate cancer    Allergy    Insomnia 10/20/2015   Advance care planning 03/08/2014   Memory loss 09/14/2013   Hypoxic brain injury (HCC) 09/14/2013   Medicare annual wellness visit, subsequent 02/16/2013   Shortness of breath 08/10/2011   Implantable cardioverter-defibrillator (ICD) in situ 02/03/2009   Hypothyroidism 11/18/2008   CONTACT DERMATITIS&OTHER ECZEMA DUE TO  PLANTS 02/03/2008   IDIOPATHIC URTICARIA 02/03/2008   Hypertension 02/03/2008   HLD (hyperlipidemia) 13-Nov-2007   Attention deficit disorder 2007/11/13   PVC/VT non sustained 11-13-2007   Sleep apnea Nov 13, 2007   HEADACHE Nov 13, 2007   SUDDEN DEATH-aborted 2007-11-13   LOW BACK PAIN SYNDROME 10/11/2007    ONSET DATE: worse since April 2025  REFERRING DIAG:  Z91.81 (ICD-10-CM) - History of fall  R26.9 (ICD-10-CM) - Gait abnormality  M54.9 (ICD-10-CM) - Back pain, unspecified back location, unspecified back pain laterality, unspecified chronicity    THERAPY DIAG:  Muscle weakness (generalized) - Plan: PT plan of  care cert/re-cert  Abnormality of gait and mobility - Plan: PT plan of care cert/re-cert  Difficulty in walking, not elsewhere classified - Plan: PT plan of care cert/re-cert  Other lack of coordination - Plan: PT plan of care cert/re-cert  Unsteadiness on feet - Plan: PT plan of care cert/re-cert  Repeated falls - Plan: PT plan of care cert/re-cert  Chronic midline low back pain with left-sided sciatica - Plan: PT plan of care cert/re-cert  Rationale for Evaluation and Treatment: Rehabilitation  SUBJECTIVE:                                                                                                                                                                                             SUBJECTIVE STATEMENT: Patient reports increased low back - worse since April 2025- pain with transfers and bending over. Reports at least 3 falls due to imbalance.    Pt accompanied by: significant other  PERTINENT HISTORY: Patient reports fall in April with subsequent report of low back. PMH includes: Anoxic brain injury in 1999 due to prolonged cardiac arrest, with residual mild cognitive impairment- Slow worsening over the years. He was most recently seen here in outpatient PT clinic for chronic neck pain with good results - now here with order for abnormal gait and low back pain.   PAIN:  Are you having pain? Yes: NPRS scale: current=2 /10; worst 8/10; best= 0/10 Pain location: Low back; Neck pain Pain description: Sharp with movement Aggravating factors: Transfers, bending, prolonged sitting/standing, any twisting; lifting Relieving factors: pain meds, rest, heat  PRECAUTIONS: Fall  RED FLAGS: None   WEIGHT BEARING RESTRICTIONS: No  FALLS: Has patient fallen in last 6 months? Yes. Number of falls 3  LIVING ENVIRONMENT: Lives with: lives with their spouse Lives in: House/apartment Stairs: No Has following equipment at home: None  PLOF: Independent  PATIENT GOALS: Patient  reports wanting to feel better and have less pain and not fall  OBJECTIVE:  Note: Objective measures were completed at Evaluation unless otherwise  noted.  DIAGNOSTIC FINDINGS: None recent  COGNITION: Overall cognitive status: History of cognitive impairments - at baseline   SENSATION: Light touch: WFL  COORDINATION: Delay reaction time with heel to shin and several other tasks during balance testing today.   EDEMA:  None observed  POSTURE: rounded shoulders, forward head, and stiffness c-spine  LOWER EXTREMITY ROM:     Active  Right Eval Left Eval  Hip flexion    Hip extension    Hip abduction    Hip adduction    Hip internal rotation    Hip external rotation    Knee flexion    Knee extension    Ankle dorsiflexion    Ankle plantarflexion    Ankle inversion    Ankle eversion     (Blank rows = not tested)  LOWER EXTREMITY MMT:    MMT Right Eval Left Eval  Hip flexion 4 4-  Hip extension    Hip abduction 4 4-  Hip adduction    Hip internal rotation 4 4-  Hip external rotation 4 4-  Knee flexion 4 4-  Knee extension 4 4-  Ankle dorsiflexion 4 4  Ankle plantarflexion 4 4  Ankle inversion 4 4  Ankle eversion 4 4  (Blank rows = not tested)  BED MOBILITY:  Findings: Sit to supine Complete Independence Supine to sit Complete Independence Rolling to Right Complete Independence Rolling to Left Complete Independence  TRANSFERS: Sit to stand: SBA  Assistive device utilized: None     Stand to sit: SBA  Assistive device utilized: None      RAMP:  Not tested  CURB:  Not tested  STAIRS: Findings: Number of Stairs: 4, Height of Stairs: 6   , and Comments: use of railings GAIT: Findings: Gait Characteristics: decreased arm swing- Right, decreased arm swing- Left, decreased step length- Right, decreased step length- Left, decreased stance time- Right, decreased stance time- Left, decreased stride length, and shuffling, Distance walked: <100 feet, Assistive  device utilized:None, Level of assistance: SBA, and Comments: shuffling- unsteady- stiff with minimal UE sway and stiff neck/posture  FUNCTIONAL TESTS:   5 times sit to stand: 23.62 sec without UE Support *pain LBP Timed up and go (TUG): 23.12 and 23.56 sec without AD= 23.34 sec avg 6 minute walk test: to be assessed next 1-2 visits 10 meter walk test: 12.36 sec= 0.81 m/s Berg Balance Scale:  Item Test date: 12/22/2023 Test date:  Test date:   Sitting to standing 4. able to stand without using hands and stabilize independently Insert OPRCBERGREEVAL SmartPhrase at re-test date Insert OPRCBERGREEVAL SmartPhrase at re-test date  2. Standing unsupported 4. able to stand safely for 2 minutes    3. Sitting with back unsupported, feet supported 4. able to sit safely and securely for 2 minutes    4. Standing to sitting 4. sits safely with minimal use of hands    5. Pivot transfer  4. able to transfer safely with minor use of hands    6. Standing unsupported with eyes closed 3. able to stand 10 seconds with supervision    7. Standing unsupported with feet together 3. able to place feet together independently and stand 1 minute with supervision    8. Reaching forward with outstretched arms while standing 3. can reach forward 12 cm (5 inches)    9. Pick up object from the floor from standing 3. able to pick up slipper but needs supervision    10. Turning to look behind over left  and right shoulders while standing 2. turns sideways but only maintains balance    11. Turn 360 degrees 2. able to turn 360 degrees safely but slowly    12. Place alternate foot on step or stool while standing unsupported 4. ble to stand independently and safely and complete 8 steps in 20 seconds    13. Standing unsupported one foot in front 2. able to take small step independently and hold 30 seconds    14. Standing on one leg 1. tries to lift leg unable to hold 3 seconds but remains standing independently.      Total Score  43/56 Total Score /56 Total Score /56    Functional gait assessment: To be assessed next 1-2 visits  PATIENT SURVEYS:  ABC scale 58.13%                                                                                                                              TREATMENT DATE: 12/22/2023 PT evaluation of LE and balance today.  Review of some LE balance activities including tandem standing- instructed to hold up to 30 sec x 3 each  LE 3x/week and SLS- up to 20 sec each LE x 3-5 times each LE. Will need handout next 1-2 session.    PATIENT EDUCATION: Education details: PT plan of care; Principles/components of balance and how each plays a vital role; purpose of PT and functional outcome measures. Ed in HEP consisting of tandem standing and SLS. Person educated: Patient and Spouse Education method: Explanation, Demonstration, Tactile cues, and Verbal cues Education comprehension: verbalized understanding, returned demonstration, verbal cues required, tactile cues required, and needs further education  HOME EXERCISE PROGRAM: Added Tandem standing and SLS- will need to issue handout next visit along with any Low back stretches  GOALS: Goals reviewed with patient? Yes  SHORT TERM GOALS: Target date: 02/02/2024  Pt will be independent with HEP in order to improve strength and balance in order to decrease fall risk and improve function at home. Baseline: EVAL- No formal HEP for balance Goal status: INITIAL  LONG TERM GOALS: Target date: 03/15/2024  1.  Patient will complete five times sit to stand test in < 15 seconds indicating an increased LE strength and improved balance. Baseline: EVAL= 23.62 sec Goal status: INITIAL  2.  Patient will improve ABC score to >70%  to demonstrate statistically significant improvement in mobility and quality of life as it relates to their confidence in balance.  Baseline: EVAL= 58.13% Goal status: INITIAL   3.  Patient will increase Berg Balance score by  > 6 points to demonstrate decreased fall risk during functional activities. Baseline: EVAL= 43/56 Goal status: INITIAL   4.   Patient will reduce timed up and go to <11 seconds to reduce fall risk and demonstrate improved transfer/gait ability. Baseline: EVAL= 23.34 sec without AD Goal status: INITIAL  5.   Patient will increase 10 meter walk test to >1.70m/s as to  improve gait speed for better community ambulation and to reduce fall risk. Baseline: EVAL=0.81 m/s without AD Goal status: INITIAL  6.   Patient will increase six minute walk test distance to >1000 for progression to community ambulator and improve gait ability Baseline: EVAL= To be assessed next visit Goal status: INITIAL     ASSESSMENT:  CLINICAL IMPRESSION: Patient is a 73 y.o. male who was seen today for physical therapy evaluation and treatment for abnormal gait and chronic back pain. For time purposes- only assessed his LE's and his balance/mobility and will further assess low back next session and make goals as appropriate. Today he presents with some BLE muscle weakness as seen by MMT and 5xSTS test. He also presents with difficulty with walking with fall history. He presents with 43/56 on BERG indicating an increased risk of falling.  Pt presents with deficits in strength, gait and balance and will benefit from skilled PT services to address these deficits, improve balance, and decrease risk for future falls.   OBJECTIVE IMPAIRMENTS: Abnormal gait, decreased activity tolerance, decreased balance, decreased cognition, decreased coordination, decreased endurance, decreased mobility, difficulty walking, decreased strength, and pain.   ACTIVITY LIMITATIONS: carrying, lifting, bending, sitting, standing, squatting, stairs, and transfers  PARTICIPATION LIMITATIONS: meal prep, cleaning, laundry, shopping, community activity, and yard work  PERSONAL FACTORS: 1-2 comorbidities: anoxic brain injury; depression are also  affecting patient's functional outcome.   REHAB POTENTIAL: Good  CLINICAL DECISION MAKING: Evolving/moderate complexity  EVALUATION COMPLEXITY: Moderate  PLAN:  PT FREQUENCY: 1-2x/week  PT DURATION: 12 weeks  PLANNED INTERVENTIONS: 97164- PT Re-evaluation, 97750- Physical Performance Testing, 97110-Therapeutic exercises, 97530- Therapeutic activity, [id number]- Neuromuscular re-education, 97535- Self Care, [id number]- Manual therapy, [id number]- Gait training, [phone]- Orthotic Initial, [phone]- Orthotic/Prosthetic subsequent, [phone]- Canalith repositioning, Patient/Family education, Balance training, Stair training, Taping, Joint mobilization, Joint manipulation, Spinal manipulation, Spinal mobilization, Compression bandaging, Vestibular training, DME instructions, Cryotherapy, and Moist heat  PLAN FOR NEXT SESSION:  Plan to continue with assessment next visit- Low back pain assessement- add goals as appropriate.  Print handout and issued to patient for balance activities and any low back activities next visit.  FGA next 1-2 visits 6 min walk next 1-2 visits   Reyes LOISE London, PT 12/23/2023, 11:20 AM

## 2023-12-25 NOTE — Progress Notes (Signed)
 Agree.  Thanks.  Arlyss EDISON Cleatus, MD 12/25/23  10:16 PM

## 2023-12-26 ENCOUNTER — Ambulatory Visit

## 2023-12-27 ENCOUNTER — Ambulatory Visit

## 2023-12-27 DIAGNOSIS — R278 Other lack of coordination: Secondary | ICD-10-CM | POA: Diagnosis not present

## 2023-12-27 DIAGNOSIS — R296 Repeated falls: Secondary | ICD-10-CM

## 2023-12-27 DIAGNOSIS — R269 Unspecified abnormalities of gait and mobility: Secondary | ICD-10-CM | POA: Diagnosis not present

## 2023-12-27 DIAGNOSIS — G8929 Other chronic pain: Secondary | ICD-10-CM

## 2023-12-27 DIAGNOSIS — M6281 Muscle weakness (generalized): Secondary | ICD-10-CM

## 2023-12-27 DIAGNOSIS — R262 Difficulty in walking, not elsewhere classified: Secondary | ICD-10-CM | POA: Diagnosis not present

## 2023-12-27 DIAGNOSIS — M542 Cervicalgia: Secondary | ICD-10-CM | POA: Diagnosis not present

## 2023-12-27 DIAGNOSIS — M5442 Lumbago with sciatica, left side: Secondary | ICD-10-CM | POA: Diagnosis not present

## 2023-12-27 DIAGNOSIS — M5382 Other specified dorsopathies, cervical region: Secondary | ICD-10-CM | POA: Diagnosis not present

## 2023-12-27 DIAGNOSIS — R2681 Unsteadiness on feet: Secondary | ICD-10-CM | POA: Diagnosis not present

## 2023-12-27 DIAGNOSIS — R2689 Other abnormalities of gait and mobility: Secondary | ICD-10-CM

## 2023-12-27 NOTE — Therapy (Signed)
 OUTPATIENT PHYSICAL THERAPY NEURO AND LOW BACK TREATMENT   Patient Name: Larry [name] MRN: [id number] DOB:12/04/1950, 73 y.o., male Today's Date: 12/27/2023   PCP: Dr. Arlyss Solian REFERRING PROVIDER: Dr. Arlyss Solian  END OF SESSION:  PT End of Session - 12/27/23 0935     Visit Number 2    Number of Visits 24    Date for PT Re-Evaluation 03/15/24    Progress Note Due on Visit 10    PT Start Time 0931    PT Stop Time 1018    PT Time Calculation (min) 47 min    Equipment Utilized During Treatment Gait belt    Activity Tolerance Patient tolerated treatment well    Behavior During Therapy WFL for tasks assessed/performed           Past Medical History:  Diagnosis Date   ADD (attention deficit disorder)    Allergy    seasonal flares of perinneal allergies   Cancer (HCC)    melanoma   Cardiac arrest (HCC) 07/1997   aborted   Dementia (HCC)    post resusitative   Depression    Diverticulosis    Family hx of prostate cancer    GERD (gastroesophageal reflux disease)    Headache(784.0)    Hyperlipidemia    Hypothyroidism    ICD (implantable cardiac defibrillator) in place    Idiopathic urticaria    Internal hemorrhoids    Low back pain syndrome    Melanoma in situ of skin of trunk (HCC)    chest   PVC (premature ventricular contraction)    associated with cardiac arrest   Sleep apnea    no c-pap   Tubular adenoma of colon 10/2010   Past Surgical History:  Procedure Laterality Date    implantation x2     CARDIAC DEFIBRILLATOR PLACEMENT  07/01/2010   Explanation of a previously implanted device, pocket revision, and insertion of a new device, and intraoperative defibrillation threshold testing Robyne)   CARDIAC DEFIBRILLATOR REMOVAL  07/05/2002   Defibrillator change Robyne) 3 times changed   CATARACT EXTRACTION W/PHACO Left 02/04/2021   Procedure: CATARACT EXTRACTION PHACO AND INTRAOCULAR LENS PLACEMENT (IOC) LEFT VIVITY toric LENS 9.21 01:11.6;   Surgeon: Mittie Gaskin, MD;  Location: [facility];  Service: Ophthalmology;  Laterality: Left;  keep patient at 11:30 arrival   CATARACT EXTRACTION W/PHACO Right 03/04/2021   Procedure: CATARACT EXTRACTION PHACO AND INTRAOCULAR LENS PLACEMENT (IOC) RIGHT VIVITY LENS 8.59 01:15.3;  Surgeon: Mittie Gaskin, MD;  Location: [facility];  Service: Ophthalmology;  Laterality: Right;   COLONOSCOPY     HEMORRHOID SURGERY     ICD     TONSILLECTOMY     Patient Active Problem List   Diagnosis Date Noted   Back pain 11/13/2023   Cervical dystonia 06/15/2022   Neck pain 07/02/2021   Gait abnormality 12/16/2020   Nonintractable headache 09/30/2020   Tinnitus 09/09/2019   History of sudden cardiac arrest 01/09/2019   Viral syndrome 09/06/2018   Seizures (HCC) 03/02/2018   Syncope 12/26/2017   Other social stressor 12/26/2017   Health care maintenance 08/14/2017   Family hx of prostate cancer    Allergy    Insomnia 10/20/2015   Advance care planning 03/08/2014   Memory loss 09/14/2013   Hypoxic brain injury (HCC) 09/14/2013   Medicare annual wellness visit, subsequent 02/16/2013   Shortness of breath 08/10/2011   Implantable cardioverter-defibrillator (ICD) in situ 02/03/2009   Hypothyroidism 11/18/2008   CONTACT DERMATITIS&OTHER ECZEMA DUE  TO PLANTS 02/03/2008   IDIOPATHIC URTICARIA 02/03/2008   Hypertension 02/03/2008   HLD (hyperlipidemia) 22-Nov-2007   Attention deficit disorder 11/22/07   PVC/VT non sustained 22-Nov-2007   Sleep apnea 2007/11/22   HEADACHE 2007/11/22   SUDDEN DEATH-aborted 2007/11/22   LOW BACK PAIN SYNDROME 10/11/2007    ONSET DATE: worse since April 2025  REFERRING DIAG:  Z91.81 (ICD-10-CM) - History of fall  R26.9 (ICD-10-CM) - Gait abnormality  M54.9 (ICD-10-CM) - Back pain, unspecified back location, unspecified back pain laterality, unspecified chronicity    THERAPY DIAG:  Muscle weakness (generalized)  Abnormality of  gait and mobility  Difficulty in walking, not elsewhere classified  Other lack of coordination  Unsteadiness on feet  Repeated falls  Chronic midline low back pain with left-sided sciatica  Other abnormalities of gait and mobility  Rationale for Evaluation and Treatment: Rehabilitation  SUBJECTIVE:                                                                                                                                                                                             SUBJECTIVE STATEMENT: Patient reports increased low back - worse since April 2025- pain with transfers and bending over. Reports at least 3 falls due to imbalance.    Pt accompanied by: significant other  PERTINENT HISTORY: Patient reports fall in April with subsequent report of low back. PMH includes: Anoxic brain injury in 1999 due to prolonged cardiac arrest, with residual mild cognitive impairment- Slow worsening over the years. He was most recently seen here in outpatient PT clinic for chronic neck pain with good results - now here with order for abnormal gait and low back pain.   PAIN:  Are you having pain? Yes: NPRS scale: current=2 /10; worst 8/10; best= 0/10 Pain location: Low back; Neck pain Pain description: Sharp with movement Aggravating factors: Transfers, bending, prolonged sitting/standing, any twisting; lifting Relieving factors: pain meds, rest, heat  PRECAUTIONS: Fall  RED FLAGS: None   WEIGHT BEARING RESTRICTIONS: No  FALLS: Has patient fallen in last 6 months? Yes. Number of falls 3  LIVING ENVIRONMENT: Lives with: lives with their spouse Lives in: House/apartment Stairs: No Has following equipment at home: None  PLOF: Independent  PATIENT GOALS: Patient reports wanting to feel better and have less pain and not fall  OBJECTIVE:  Note: Objective measures were completed at Evaluation unless otherwise noted.  DIAGNOSTIC FINDINGS: None recent  COGNITION: Overall  cognitive status: History of cognitive impairments - at baseline   SENSATION: Light touch: WFL  COORDINATION: Delay reaction time with heel to shin and several other tasks during balance testing today.  EDEMA:  None observed  POSTURE: rounded shoulders, forward head, and stiffness c-spine  LOWER EXTREMITY ROM:     Active  Right Eval Left Eval  Hip flexion    Hip extension    Hip abduction    Hip adduction    Hip internal rotation    Hip external rotation    Knee flexion    Knee extension    Ankle dorsiflexion    Ankle plantarflexion    Ankle inversion    Ankle eversion     (Blank rows = not tested)  LOWER EXTREMITY MMT:    MMT Right Eval Left Eval  Hip flexion 4 4-  Hip extension    Hip abduction 4 4-  Hip adduction    Hip internal rotation 4 4-  Hip external rotation 4 4-  Knee flexion 4 4-  Knee extension 4 4-  Ankle dorsiflexion 4 4  Ankle plantarflexion 4 4  Ankle inversion 4 4  Ankle eversion 4 4  (Blank rows = not tested)  BED MOBILITY:  Findings: Sit to supine Complete Independence Supine to sit Complete Independence Rolling to Right Complete Independence Rolling to Left Complete Independence  TRANSFERS: Sit to stand: SBA  Assistive device utilized: None     Stand to sit: SBA  Assistive device utilized: None      RAMP:  Not tested  CURB:  Not tested  STAIRS: Findings: Number of Stairs: 4, Height of Stairs: 6   , and Comments: use of railings GAIT: Findings: Gait Characteristics: decreased arm swing- Right, decreased arm swing- Left, decreased step length- Right, decreased step length- Left, decreased stance time- Right, decreased stance time- Left, decreased stride length, and shuffling, Distance walked: <100 feet, Assistive device utilized:None, Level of assistance: SBA, and Comments: shuffling- unsteady- stiff with minimal UE sway and stiff neck/posture  FUNCTIONAL TESTS:   5 times sit to stand: 23.62 sec without UE Support *pain  LBP Timed up and go (TUG): 23.12 and 23.56 sec without AD= 23.34 sec avg 6 minute walk test: to be assessed next 1-2 visits 10 meter walk test: 12.36 sec= 0.81 m/s Berg Balance Scale:  Item Test date: 12/22/2023 Test date:  Test date:   Sitting to standing 4. able to stand without using hands and stabilize independently Insert OPRCBERGREEVAL SmartPhrase at re-test date Insert OPRCBERGREEVAL SmartPhrase at re-test date  2. Standing unsupported 4. able to stand safely for 2 minutes    3. Sitting with back unsupported, feet supported 4. able to sit safely and securely for 2 minutes    4. Standing to sitting 4. sits safely with minimal use of hands    5. Pivot transfer  4. able to transfer safely with minor use of hands    6. Standing unsupported with eyes closed 3. able to stand 10 seconds with supervision    7. Standing unsupported with feet together 3. able to place feet together independently and stand 1 minute with supervision    8. Reaching forward with outstretched arms while standing 3. can reach forward 12 cm (5 inches)    9. Pick up object from the floor from standing 3. able to pick up slipper but needs supervision    10. Turning to look behind over left and right shoulders while standing 2. turns sideways but only maintains balance    11. Turn 360 degrees 2. able to turn 360 degrees safely but slowly    12. Place alternate foot on step or stool while standing unsupported 4.  ble to stand independently and safely and complete 8 steps in 20 seconds    13. Standing unsupported one foot in front 2. able to take small step independently and hold 30 seconds    14. Standing on one leg 1. tries to lift leg unable to hold 3 seconds but remains standing independently.      Total Score 43/56 Total Score /56 Total Score /56    Functional gait assessment: To be assessed next 1-2 visits  PATIENT SURVEYS:  ABC scale 58.13%                                                                                                                               TREATMENT DATE: 12/27/2023  PT evaluation of low back  today:  .    PATIENT EDUCATION: SUBJECTIVE Chief complaint:  Low back  History: chronic but worse after fall earlier this year.  Red flags None of the following: (bowel/bladder changes, saddle paresthesia, personal history of cancer, chills/fever, night sweats, unrelenting pain, first onset of insidious LBP <20 y/o) Negative  Pain location: low back - central L4-S1 Pain: Present 0/10, Best 0/10, Worst 8/10 Pain quality: aching, dull, and stabbing Radiating pain: No  Numbness/Tingling: No 24 hour pain behavior: Pain with transfers and reaching/bending over Aggravating factors: transfers/reaching/bending Easing factors: rest in recliner, Medication How long can you sit: as long as I want How long can you stand: 10 min before pain stops me How long can you walk: unlimited  History of prior back injury, pain, surgery, or therapy: No Follow-up appointment with MD: No Dominant hand: right Imaging: Yes -X ray Falls in the last 6 months: Yes  Occupational demands: Not currently working  Hobbies: gardening; playing cards Goals: To be as painfree   OBJECTIVE     Gait Wide based gait- no device- stiff nature with decreased UE swing  Posture Lumbar lordosis: WNL Iliac crest height: equal bilaterally Lumbar lateral shift: negative   AROM (degrees) R/L (all movements include overpressure unless otherwise stated) Lumbar forward flexion: 25* Lumbar extension: 10* Lumbar lateral flexion: R: fingers to 2 above knee jt line L:  fingers approx 3 above lateral knee jt. Line*  *Indicates pain     Repeated Movements No centralization or peripheralization of symptoms with repeated lumbar extension or flexion.      Sensation intact to light touch bilateral LEs as determined by testing dermatomes L2-S2.    R Palpation Graded on 0-4 scale (0 = no pain, 1 = pain, 2 = pain  with wincing/grimacing/flinching, 3 = pain with withdrawal, 4 = unwilling to allow palpation) Location LEFT  RIGHT           Lumbar paraspinals 2 2  Quadratus Lumborum 0 0  Gluteus Maximus 0 0  Gluteus Medius 0 0  Deep hip external rotators 0 0  PSIS 0 0  Fortin's Area (SIJ) 0 0  Greater Trochanter 0 0  Muscle Length Hamstrings: R: 33 degrees L: 22 degrees      Passive Accessory Intervertebral Motion (PAIVM) Pt reports some reproduction of back pain with CPA L1-L5 and UPA bilaterally L1-L5. Generally hypomobile throughout    SPECIAL TESTS Lumbar Radiculopathy and Discogenic:  Slump (SN 83, -LR 0.32): R: Negative L: Negative SLR (SN 92, -LR 0.29): R: Negative L:  Negative      SIJ:  Thigh Thrust (SN 88, -LR 0.18) : R: Negative L: Negative    Functional Tasks Lifting: limited due to pain Deep squat: not tested Sit to stand: slow and guarded  6 Min Walk Test:  Instructed patient to ambulate as quickly and as safely as possible for 6 minutes using LRAD. Patient was allowed to take standing rest breaks without stopping the test, but if the patient required a sitting rest break the clock would be stopped and the test would be over.  Results: 950 feet (290 meters, Avg speed .81 m/s) using no AD with CGA. Results indicate that the patient has reduced endurance with ambulation compared to age matched norms.  Age Matched Norms: 68-69 yo M: 29 F: 82, 6-79 yo M: 62 F: 471, 61-89 yo M: 417 F: 392 MDC: 58.21 meters (190.98 feet) or 50 meters (ANPTA Core Set of Outcome Measures for Adults with Neurologic Conditions, 2018)    Education details: PT plan of care; Principles/components of balance and how each plays a vital role; purpose of PT and functional outcome measures. Ed in HEP consisting of tandem standing and SLS. Person educated: Patient and Spouse Education method: Explanation, Demonstration, Tactile cues, and Verbal cues Education comprehension: verbalized  understanding, returned demonstration, verbal cues required, tactile cues required, and needs further education  HOME EXERCISE PROGRAM: 12/27/2023:Added seated hamstring, Lower trunk rotation, and knee to chest- will update and issue HEP next session. EVAL: Added Tandem standing and SLS- will need to issue handout next visit along with any Low back stretches  GOALS: Goals reviewed with patient? Yes  SHORT TERM GOALS: Target date: 02/02/2024  Pt will be independent with HEP in order to improve strength and balance in order to decrease fall risk and improve function at home. Baseline: EVAL- No formal HEP for balance Goal status: INITIAL  LONG TERM GOALS: Target date: 03/15/2024  1.  Patient will complete five times sit to stand test in < 15 seconds indicating an increased LE strength and improved balance. Baseline: EVAL= 23.62 sec Goal status: INITIAL  2.  Patient will improve ABC score to >70%  to demonstrate statistically significant improvement in mobility and quality of life as it relates to their confidence in balance.  Baseline: EVAL= 58.13% Goal status: INITIAL   3.  Patient will increase Berg Balance score by > 6 points to demonstrate decreased fall risk during functional activities. Baseline: EVAL= 43/56 Goal status: INITIAL   4.   Patient will reduce timed up and go to <11 seconds to reduce fall risk and demonstrate improved transfer/gait ability. Baseline: EVAL= 23.34 sec without AD Goal status: INITIAL  5.   Patient will increase 10 meter walk test to >1.105m/s as to improve gait speed for better community ambulation and to reduce fall risk. Baseline: EVAL=0.81 m/s without AD Goal status: INITIAL  6.   Patient will increase six minute walk test distance to >1000 for progression to community ambulator and improve gait ability Baseline: EVAL= To be assessed next visit; 12/27/2023= 950 feet without AD Goal status: INITIAL  7. Pt will decrease mODI score  by at least 13  points in order demonstrate clinically significant reduction in back pain/disability.   Baseline: will issue next visit  Goal status: NEW  8. Pt will decrease worst back pain as reported on NPRS by at least 2 points in order to demonstrate clinically significant reduction in back pain.   Baseline: 12/27/2023= 8/10 worst low back pain  Goal status: NEW 9. Patient will demonstrate 50% improved lumbar AROM for improved transfers/bed mobility, and standing posture.   Baseline: 12/27/2023- very restricted ROM (see table above)   Goal status: NEW  ASSESSMENT:  CLINICAL IMPRESSION: Patient is a 73 y.o. male who was seen today for physical therapy evaluation and treatment for chronic back pain. For time purposes- assessed LE an balance last visit and low back today. PT examination reveals deficits . Pt presents with deficits including limited lumbar/LE ROM/flexibility overall today with some tenderness in central low back/paraspinals. He also presents with decreased functional endurance as seen by 6 min walk test findings. He was introduced to some general Low back stretching and benefit from review before adding to HEP - will plan to review and issue HEP next visit.   Pt presents with deficits in strength, gait and balance and will benefit from skilled PT services to address these deficits, improve balance, and decrease risk for future falls.   OBJECTIVE IMPAIRMENTS: Abnormal gait, decreased activity tolerance, decreased balance, decreased cognition, decreased coordination, decreased endurance, decreased mobility, difficulty walking, decreased strength, and pain.   ACTIVITY LIMITATIONS: carrying, lifting, bending, sitting, standing, squatting, stairs, and transfers  PARTICIPATION LIMITATIONS: meal prep, cleaning, laundry, shopping, community activity, and yard work  PERSONAL FACTORS: 1-2 comorbidities: anoxic brain injury; depression are also affecting patient's functional outcome.   REHAB POTENTIAL:  Good  CLINICAL DECISION MAKING: Evolving/moderate complexity  EVALUATION COMPLEXITY: Moderate  PLAN:  PT FREQUENCY: 1-2x/week  PT DURATION: 12 weeks  PLANNED INTERVENTIONS: 97164- PT Re-evaluation, 97750- Physical Performance Testing, 97110-Therapeutic exercises, 97530- Therapeutic activity, [id number]- Neuromuscular re-education, 97535- Self Care, [id number]- Manual therapy, [id number]- Gait training, [id number]- Orthotic Initial, [phone]- Orthotic/Prosthetic subsequent, [phone]- Canalith repositioning, Patient/Family education, Balance training, Stair training, Taping, Joint mobilization, Joint manipulation, Spinal manipulation, Spinal mobilization, Compression bandaging, Vestibular training, DME instructions, Cryotherapy, and Moist heat  PLAN FOR NEXT SESSION:  Print handout and issued to patient for balance activities and any low back activities next visit.  FGA next 1-2 visits MOD oswestry next visit    Larry [name], PT 12/27/2023, 11:32 AM

## 2023-12-29 ENCOUNTER — Ambulatory Visit

## 2023-12-29 DIAGNOSIS — M5382 Other specified dorsopathies, cervical region: Secondary | ICD-10-CM | POA: Diagnosis not present

## 2023-12-29 DIAGNOSIS — R278 Other lack of coordination: Secondary | ICD-10-CM | POA: Diagnosis not present

## 2023-12-29 DIAGNOSIS — R262 Difficulty in walking, not elsewhere classified: Secondary | ICD-10-CM

## 2023-12-29 DIAGNOSIS — M6281 Muscle weakness (generalized): Secondary | ICD-10-CM | POA: Diagnosis not present

## 2023-12-29 DIAGNOSIS — R269 Unspecified abnormalities of gait and mobility: Secondary | ICD-10-CM | POA: Diagnosis not present

## 2023-12-29 DIAGNOSIS — M542 Cervicalgia: Secondary | ICD-10-CM | POA: Diagnosis not present

## 2023-12-29 DIAGNOSIS — R2681 Unsteadiness on feet: Secondary | ICD-10-CM

## 2023-12-29 DIAGNOSIS — R296 Repeated falls: Secondary | ICD-10-CM | POA: Diagnosis not present

## 2023-12-29 DIAGNOSIS — R2689 Other abnormalities of gait and mobility: Secondary | ICD-10-CM | POA: Diagnosis not present

## 2023-12-29 DIAGNOSIS — G8929 Other chronic pain: Secondary | ICD-10-CM | POA: Diagnosis not present

## 2023-12-29 DIAGNOSIS — M5442 Lumbago with sciatica, left side: Secondary | ICD-10-CM | POA: Diagnosis not present

## 2023-12-29 NOTE — Therapy (Signed)
 OUTPATIENT PHYSICAL THERAPY NEURO AND LOW BACK TREATMENT   Patient Name: Larry [name] MRN: [id number] DOB:05/25/1951, 73 y.o., male Today's Date: 12/29/2023   PCP: Dr. Arlyss Solian REFERRING PROVIDER: Dr. Arlyss Solian  END OF SESSION:  PT End of Session - 12/29/23 0937     Visit Number 3    Number of Visits 24    Date for PT Re-Evaluation 03/15/24    Progress Note Due on Visit 10    PT Start Time 0933    Equipment Utilized During Treatment Gait belt    Activity Tolerance Patient tolerated treatment well    Behavior During Therapy [facility] for tasks assessed/performed           Past Medical History:  Diagnosis Date   ADD (attention deficit disorder)    Allergy    seasonal flares of perinneal allergies   Cancer (HCC)    melanoma   Cardiac arrest (HCC) 07/1997   aborted   Dementia (HCC)    post resusitative   Depression    Diverticulosis    Family hx of prostate cancer    GERD (gastroesophageal reflux disease)    Headache(784.0)    Hyperlipidemia    Hypothyroidism    ICD (implantable cardiac defibrillator) in place    Idiopathic urticaria    Internal hemorrhoids    Low back pain syndrome    Melanoma in situ of skin of trunk (HCC)    chest   PVC (premature ventricular contraction)    associated with cardiac arrest   Sleep apnea    no c-pap   Tubular adenoma of colon 10/2010   Past Surgical History:  Procedure Laterality Date    implantation x2     CARDIAC DEFIBRILLATOR PLACEMENT  07/01/2010   Explanation of a previously implanted device, pocket revision, and insertion of a new device, and intraoperative defibrillation threshold testing Robyne)   CARDIAC DEFIBRILLATOR REMOVAL  07/05/2002   Defibrillator change Robyne) 3 times changed   CATARACT EXTRACTION W/PHACO Left 02/04/2021   Procedure: CATARACT EXTRACTION PHACO AND INTRAOCULAR LENS PLACEMENT (IOC) LEFT VIVITY toric LENS 9.21 01:11.6;  Surgeon: Mittie Gaskin, MD;  Location: [facility];  Service: Ophthalmology;  Laterality: Left;  keep patient at 11:30 arrival   CATARACT EXTRACTION W/PHACO Right 03/04/2021   Procedure: CATARACT EXTRACTION PHACO AND INTRAOCULAR LENS PLACEMENT (IOC) RIGHT VIVITY LENS 8.59 01:15.3;  Surgeon: Mittie Gaskin, MD;  Location: [facility];  Service: Ophthalmology;  Laterality: Right;   COLONOSCOPY     HEMORRHOID SURGERY     ICD     TONSILLECTOMY     Patient Active Problem List   Diagnosis Date Noted   Back pain 11/13/2023   Cervical dystonia 06/15/2022   Neck pain 07/02/2021   Gait abnormality 12/16/2020   Nonintractable headache 09/30/2020   Tinnitus 09/09/2019   History of sudden cardiac arrest 01/09/2019   Viral syndrome 09/06/2018   Seizures (HCC) 03/02/2018   Syncope 12/26/2017   Other social stressor 12/26/2017   Health care maintenance 08/14/2017   Family hx of prostate cancer    Allergy    Insomnia 10/20/2015   Advance care planning 03/08/2014   Memory loss 09/14/2013   Hypoxic brain injury (HCC) 09/14/2013   Medicare annual wellness visit, subsequent 02/16/2013   Shortness of breath 08/10/2011   Implantable cardioverter-defibrillator (ICD) in situ 02/03/2009   Hypothyroidism 11/18/2008   CONTACT DERMATITIS&OTHER ECZEMA DUE TO PLANTS 02/03/2008   IDIOPATHIC URTICARIA 02/03/2008   Hypertension 02/03/2008   HLD (hyperlipidemia)  2007-11-02   Attention deficit disorder 11-02-07   PVC/VT non sustained November 02, 2007   Sleep apnea November 02, 2007   HEADACHE Nov 02, 2007   SUDDEN DEATH-aborted November 02, 2007   LOW BACK PAIN SYNDROME 10/11/2007    ONSET DATE: worse since April 2025  REFERRING DIAG:  Z91.81 (ICD-10-CM) - History of fall  R26.9 (ICD-10-CM) - Gait abnormality  M54.9 (ICD-10-CM) - Back pain, unspecified back location, unspecified back pain laterality, unspecified chronicity    THERAPY DIAG:  Muscle weakness (generalized)  Abnormality of gait and mobility  Difficulty in walking, not elsewhere  classified  Other lack of coordination  Unsteadiness on feet  Rationale for Evaluation and Treatment: Rehabilitation  SUBJECTIVE:                                                                                                                                                                                             SUBJECTIVE STATEMENT: Patient reports feeling tight today. States hoping for a good beach trip next week.   Pt accompanied by: significant other  PERTINENT HISTORY: Patient reports fall in April with subsequent report of low back. PMH includes: Anoxic brain injury in 1999 due to prolonged cardiac arrest, with residual mild cognitive impairment- Slow worsening over the years. He was most recently seen here in outpatient PT clinic for chronic neck pain with good results - now here with order for abnormal gait and low back pain.   PAIN:  Are you having pain? Yes: NPRS scale: current=2 /10; worst 8/10; best= 0/10 Pain location: Low back; Neck pain Pain description: Sharp with movement Aggravating factors: Transfers, bending, prolonged sitting/standing, any twisting; lifting Relieving factors: pain meds, rest, heat  PRECAUTIONS: Fall  RED FLAGS: None   WEIGHT BEARING RESTRICTIONS: No  FALLS: Has patient fallen in last 6 months? Yes. Number of falls 3  LIVING ENVIRONMENT: Lives with: lives with their spouse Lives in: House/apartment Stairs: No Has following equipment at home: None  PLOF: Independent  PATIENT GOALS: Patient reports wanting to feel better and have less pain and not fall  OBJECTIVE:  Note: Objective measures were completed at Evaluation unless otherwise noted.  DIAGNOSTIC FINDINGS: None recent  COGNITION: Overall cognitive status: History of cognitive impairments - at baseline   SENSATION: Light touch: WFL  COORDINATION: Delay reaction time with heel to shin and several other tasks during balance testing today.   EDEMA:  None  observed  POSTURE: rounded shoulders, forward head, and stiffness c-spine  LOWER EXTREMITY ROM:     Active  Right Eval Left Eval  Hip flexion    Hip extension    Hip abduction    Hip  adduction    Hip internal rotation    Hip external rotation    Knee flexion    Knee extension    Ankle dorsiflexion    Ankle plantarflexion    Ankle inversion    Ankle eversion     (Blank rows = not tested)  LOWER EXTREMITY MMT:    MMT Right Eval Left Eval  Hip flexion 4 4-  Hip extension    Hip abduction 4 4-  Hip adduction    Hip internal rotation 4 4-  Hip external rotation 4 4-  Knee flexion 4 4-  Knee extension 4 4-  Ankle dorsiflexion 4 4  Ankle plantarflexion 4 4  Ankle inversion 4 4  Ankle eversion 4 4  (Blank rows = not tested)  BED MOBILITY:  Findings: Sit to supine Complete Independence Supine to sit Complete Independence Rolling to Right Complete Independence Rolling to Left Complete Independence  TRANSFERS: Sit to stand: SBA  Assistive device utilized: None     Stand to sit: SBA  Assistive device utilized: None      RAMP:  Not tested  CURB:  Not tested  STAIRS: Findings: Number of Stairs: 4, Height of Stairs: 6   , and Comments: use of railings GAIT: Findings: Gait Characteristics: decreased arm swing- Right, decreased arm swing- Left, decreased step length- Right, decreased step length- Left, decreased stance time- Right, decreased stance time- Left, decreased stride length, and shuffling, Distance walked: <100 feet, Assistive device utilized:None, Level of assistance: SBA, and Comments: shuffling- unsteady- stiff with minimal UE sway and stiff neck/posture  FUNCTIONAL TESTS:   5 times sit to stand: 23.62 sec without UE Support *pain LBP Timed up and go (TUG): 23.12 and 23.56 sec without AD= 23.34 sec avg 6 minute walk test: to be assessed next 1-2 visits 10 meter walk test: 12.36 sec= 0.81 m/s Berg Balance Scale:  Item Test date: 12/22/2023 Test date:   Test date:   Sitting to standing 4. able to stand without using hands and stabilize independently Insert OPRCBERGREEVAL SmartPhrase at re-test date Insert OPRCBERGREEVAL SmartPhrase at re-test date  2. Standing unsupported 4. able to stand safely for 2 minutes    3. Sitting with back unsupported, feet supported 4. able to sit safely and securely for 2 minutes    4. Standing to sitting 4. sits safely with minimal use of hands    5. Pivot transfer  4. able to transfer safely with minor use of hands    6. Standing unsupported with eyes closed 3. able to stand 10 seconds with supervision    7. Standing unsupported with feet together 3. able to place feet together independently and stand 1 minute with supervision    8. Reaching forward with outstretched arms while standing 3. can reach forward 12 cm (5 inches)    9. Pick up object from the floor from standing 3. able to pick up slipper but needs supervision    10. Turning to look behind over left and right shoulders while standing 2. turns sideways but only maintains balance    11. Turn 360 degrees 2. able to turn 360 degrees safely but slowly    12. Place alternate foot on step or stool while standing unsupported 4. ble to stand independently and safely and complete 8 steps in 20 seconds    13. Standing unsupported one foot in front 2. able to take small step independently and hold 30 seconds    14. Standing on one leg 1. tries  to lift leg unable to hold 3 seconds but remains standing independently.      Total Score 43/56 Total Score /56 Total Score /56    Functional gait assessment: To be assessed next 1-2 visits  PATIENT SURVEYS:  ABC scale 58.13%    SUBJECTIVE Chief complaint:  Low back  History: chronic but worse after fall earlier this year.  Red flags None of the following: (bowel/bladder changes, saddle paresthesia, personal history of cancer, chills/fever, night sweats, unrelenting pain, first onset of insidious LBP <71 y/o)  Negative  Pain location: low back - central L4-S1 Pain: Present 0/10, Best 0/10, Worst 8/10 Pain quality: aching, dull, and stabbing Radiating pain: No  Numbness/Tingling: No 24 hour pain behavior: Pain with transfers and reaching/bending over Aggravating factors: transfers/reaching/bending Easing factors: rest in recliner, Medication How long can you sit: as long as I want How long can you stand: 10 min before pain stops me How long can you walk: unlimited  History of prior back injury, pain, surgery, or therapy: No Follow-up appointment with MD: No Dominant hand: right Imaging: Yes -X ray Falls in the last 6 months: Yes  Occupational demands: Not currently working  Hobbies: gardening; playing cards Goals: To be as painfree   OBJECTIVE     Gait Wide based gait- no device- stiff nature with decreased UE swing  Posture Lumbar lordosis: WNL Iliac crest height: equal bilaterally Lumbar lateral shift: negative   AROM (degrees) R/L (all movements include overpressure unless otherwise stated) Lumbar forward flexion: 25* Lumbar extension: 10* Lumbar lateral flexion: R: fingers to 2 above knee jt line L:  fingers approx 3 above lateral knee jt. Line*  *Indicates pain     Repeated Movements No centralization or peripheralization of symptoms with repeated lumbar extension or flexion.      Sensation intact to light touch bilateral LEs as determined by testing dermatomes L2-S2.    R Palpation Graded on 0-4 scale (0 = no pain, 1 = pain, 2 = pain with wincing/grimacing/flinching, 3 = pain with withdrawal, 4 = unwilling to allow palpation) Location LEFT  RIGHT           Lumbar paraspinals 2 2  Quadratus Lumborum 0 0  Gluteus Maximus 0 0  Gluteus Medius 0 0  Deep hip external rotators 0 0  PSIS 0 0  Fortin's Area (SIJ) 0 0  Greater Trochanter 0 0     Muscle Length Hamstrings: R: 33 degrees L: 22 degrees      Passive Accessory Intervertebral Motion  (PAIVM) Pt reports some reproduction of back pain with CPA L1-L5 and UPA bilaterally L1-L5. Generally hypomobile throughout    SPECIAL TESTS Lumbar Radiculopathy and Discogenic:  Slump (SN 83, -LR 0.32): R: Negative L: Negative SLR (SN 92, -LR 0.29): R: Negative L:  Negative      SIJ:  Thigh Thrust (SN 88, -LR 0.18) : R: Negative L: Negative    Functional Tasks Lifting: limited due to pain Deep squat: not tested Sit to stand: slow and guarded  6 Min Walk Test:  Instructed patient to ambulate as quickly and as safely as possible for 6 minutes using LRAD. Patient was allowed to take standing rest breaks without stopping the test, but if the patient required a sitting rest break the clock would be stopped and the test would be over.  Results: 950 feet (290 meters, Avg speed .81 m/s) using no AD with CGA. Results indicate that the patient has reduced endurance with ambulation compared to  age matched norms.  Age Matched Norms: 39-69 yo M: 17 F: 41, 29-79 yo M: 37 F: 471, 10-89 yo M: 417 F: 392 MDC: 58.21 meters (190.98 feet) or 50 meters (ANPTA Core Set of Outcome Measures for Adults with Neurologic Conditions, 2018)                                                                                                                              TREATMENT DATE: 12/27/2023  Self care/Home management:  Issued and completed the MOD Oswestry- 42% -Supine (hooklye) knee to chest- hold 30 sec x 3 each LE -Supine (hooklye) lower trunk rotation- hold 30 sec x 3  -Supine Piriformis stretch- hold 30 sec x 3 -Supine bridging x 12 reps  -Seated lumbar flex - 3 way (diagonal to left, center, right) x 10  -Seated hamstring stretch- hold 30 sec x2    .    PATIENT EDUCATION:    Education details: PT plan of care; Principles/components of balance and how each plays a vital role; purpose of PT and functional outcome measures. Ed in HEP consisting of tandem standing and SLS. Person educated:  Patient and Spouse Education method: Explanation, Demonstration, Tactile cues, and Verbal cues Education comprehension: verbalized understanding, returned demonstration, verbal cues required, tactile cues required, and needs further education  HOME EXERCISE PROGRAM: Access Code: [email or web address] URL: [email or web address] Date: 12/29/2023 Prepared by: Reyes London  Exercises - Supine Single Knee to Chest Stretch  - 1 x daily - 3 sets - 30 hold - Supine Lower Trunk Rotation  - 1 x daily - 3 sets - 30 sec  hold - Seated Hamstring Stretch  - 1 x daily - 3 sets - 30 hold - Supine Piriformis Stretch with Leg Straight  - 1 x daily - 3 sets - 30 hold - Seated 3 Way Exercise Ball Roll Out Stretch  - 1 x daily - 3 sets - 10 reps - Supine Bridge  - 3 x weekly - 3 sets - 10 reps    12/27/2023:Added seated hamstring, Lower trunk rotation, and knee to chest- will update and issue HEP next session. EVAL: Added Tandem standing and SLS- will need to issue handout next visit along with any Low back stretches  GOALS: Goals reviewed with patient? Yes  SHORT TERM GOALS: Target date: 02/02/2024  Pt will be independent with HEP in order to improve strength and balance in order to decrease fall risk and improve function at home. Baseline: EVAL- No formal HEP for balance Goal status: INITIAL  LONG TERM GOALS: Target date: 03/15/2024  1.  Patient will complete five times sit to stand test in < 15 seconds indicating an increased LE strength and improved balance. Baseline: EVAL= 23.62 sec Goal status: INITIAL  2.  Patient will improve ABC score to >70%  to demonstrate statistically significant improvement in mobility and quality of life as it relates to their confidence in balance.  Baseline: EVAL= 58.13% Goal status:  INITIAL   3.  Patient will increase Berg Balance score by > 6 points to demonstrate decreased fall risk during functional activities. Baseline: EVAL= 43/56 Goal status:  INITIAL   4.   Patient will reduce timed up and go to <11 seconds to reduce fall risk and demonstrate improved transfer/gait ability. Baseline: EVAL= 23.34 sec without AD Goal status: INITIAL  5.   Patient will increase 10 meter walk test to >1.59m/s as to improve gait speed for better community ambulation and to reduce fall risk. Baseline: EVAL=0.81 m/s without AD Goal status: INITIAL  6.   Patient will increase six minute walk test distance to >1000 for progression to community ambulator and improve gait ability Baseline: EVAL= To be assessed next visit; 12/27/2023= 950 feet without AD Goal status: INITIAL  7. Pt will decrease mODI score by at least 13 points in order demonstrate clinically significant reduction in back pain/disability.   Baseline: will issue next visit; 12/29/2023= 42%  Goal status: NEW  8. Pt will decrease worst back pain as reported on NPRS by at least 2 points in order to demonstrate clinically significant reduction in back pain.   Baseline: 12/27/2023= 8/10 worst low back pain  Goal status: NEW 9. Patient will demonstrate 50% improved lumbar AROM for improved transfers/bed mobility, and standing posture.   Baseline: 12/27/2023- very restricted ROM (see table above)   Goal status: NEW  ASSESSMENT:  CLINICAL IMPRESSION: Patient is a 73 y.o. male who was seen today for physical therapy evaluation and treatment for chronic back pain. Patient completed the Modified Oswestry scale indicating significant self perceived limitations with daily activities because of back pain. He was instructed in home program to begin as he plans to be off next week and going to beach. He required constant cues for breathing and for all technique and issued handout- reviewed withpatient and his wife today. Will advance HEP and continue with progression of core and low back stretching next visit.   Pt pwill benefit from skilled PT services to address his low back symptoms, improve balance, and  decrease risk for future falls.   OBJECTIVE IMPAIRMENTS: Abnormal gait, decreased activity tolerance, decreased balance, decreased cognition, decreased coordination, decreased endurance, decreased mobility, difficulty walking, decreased strength, and pain.   ACTIVITY LIMITATIONS: carrying, lifting, bending, sitting, standing, squatting, stairs, and transfers  PARTICIPATION LIMITATIONS: meal prep, cleaning, laundry, shopping, community activity, and yard work  PERSONAL FACTORS: 1-2 comorbidities: anoxic brain injury; depression are also affecting patient's functional outcome.   REHAB POTENTIAL: Good  CLINICAL DECISION MAKING: Evolving/moderate complexity  EVALUATION COMPLEXITY: Moderate  PLAN:  PT FREQUENCY: 1-2x/week  PT DURATION: 12 weeks  PLANNED INTERVENTIONS: 97164- PT Re-evaluation, 97750- Physical Performance Testing, 97110-Therapeutic exercises, 97530- Therapeutic activity, [id number]- Neuromuscular re-education, 97535- Self Care, [id number]- Manual therapy, [id number]- Gait training, [phone]- Orthotic Initial, [phone]- Orthotic/Prosthetic subsequent, [phone]- Canalith repositioning, Patient/Family education, Balance training, Stair training, Taping, Joint mobilization, Joint manipulation, Spinal manipulation, Spinal mobilization, Compression bandaging, Vestibular training, DME instructions, Cryotherapy, and Moist heat  PLAN FOR NEXT SESSION:  Review and progress HEP FGA next 1-2 visits    Reyes LOISE London, PT 12/29/2023, 9:38 AM

## 2024-01-02 ENCOUNTER — Ambulatory Visit

## 2024-01-03 ENCOUNTER — Ambulatory Visit

## 2024-01-05 ENCOUNTER — Ambulatory Visit

## 2024-01-09 ENCOUNTER — Ambulatory Visit

## 2024-01-10 ENCOUNTER — Ambulatory Visit

## 2024-01-12 ENCOUNTER — Ambulatory Visit: Attending: Neurology

## 2024-01-12 DIAGNOSIS — R296 Repeated falls: Secondary | ICD-10-CM | POA: Insufficient documentation

## 2024-01-12 DIAGNOSIS — R262 Difficulty in walking, not elsewhere classified: Secondary | ICD-10-CM | POA: Diagnosis not present

## 2024-01-12 DIAGNOSIS — M5442 Lumbago with sciatica, left side: Secondary | ICD-10-CM | POA: Insufficient documentation

## 2024-01-12 DIAGNOSIS — G8929 Other chronic pain: Secondary | ICD-10-CM | POA: Insufficient documentation

## 2024-01-12 DIAGNOSIS — R2681 Unsteadiness on feet: Secondary | ICD-10-CM | POA: Insufficient documentation

## 2024-01-12 DIAGNOSIS — R2689 Other abnormalities of gait and mobility: Secondary | ICD-10-CM | POA: Insufficient documentation

## 2024-01-12 DIAGNOSIS — R269 Unspecified abnormalities of gait and mobility: Secondary | ICD-10-CM | POA: Insufficient documentation

## 2024-01-12 DIAGNOSIS — R278 Other lack of coordination: Secondary | ICD-10-CM | POA: Diagnosis not present

## 2024-01-12 DIAGNOSIS — M6281 Muscle weakness (generalized): Secondary | ICD-10-CM | POA: Insufficient documentation

## 2024-01-12 NOTE — Therapy (Signed)
 OUTPATIENT PHYSICAL THERAPY NEURO AND LOW BACK TREATMENT   Patient Name: Larry [name] MRN: [id number] DOB:02/20/1951, 73 y.o., male Today's Date: 01/12/2024   PCP: Dr. Arlyss Solian REFERRING PROVIDER: Dr. Arlyss Solian  END OF SESSION:  PT End of Session - 01/12/24 0932     Visit Number 4    Number of Visits 24    Date for PT Re-Evaluation 03/15/24    Progress Note Due on Visit 10    PT Start Time 0932    PT Stop Time 1014    PT Time Calculation (min) 42 min    Equipment Utilized During Treatment Gait belt    Activity Tolerance Patient tolerated treatment well    Behavior During Therapy WFL for tasks assessed/performed           Past Medical History:  Diagnosis Date   ADD (attention deficit disorder)    Allergy    seasonal flares of perinneal allergies   Cancer (HCC)    melanoma   Cardiac arrest (HCC) 07/1997   aborted   Dementia (HCC)    post resusitative   Depression    Diverticulosis    Family hx of prostate cancer    GERD (gastroesophageal reflux disease)    Headache(784.0)    Hyperlipidemia    Hypothyroidism    ICD (implantable cardiac defibrillator) in place    Idiopathic urticaria    Internal hemorrhoids    Low back pain syndrome    Melanoma in situ of skin of trunk (HCC)    chest   PVC (premature ventricular contraction)    associated with cardiac arrest   Sleep apnea    no c-pap   Tubular adenoma of colon 10/2010   Past Surgical History:  Procedure Laterality Date    implantation x2     CARDIAC DEFIBRILLATOR PLACEMENT  07/01/2010   Explanation of a previously implanted device, pocket revision, and insertion of a new device, and intraoperative defibrillation threshold testing Robyne)   CARDIAC DEFIBRILLATOR REMOVAL  07/05/2002   Defibrillator change Robyne) 3 times changed   CATARACT EXTRACTION W/PHACO Left 02/04/2021   Procedure: CATARACT EXTRACTION PHACO AND INTRAOCULAR LENS PLACEMENT (IOC) LEFT VIVITY toric LENS 9.21 01:11.6;   Surgeon: Mittie Gaskin, MD;  Location: [facility];  Service: Ophthalmology;  Laterality: Left;  keep patient at 11:30 arrival   CATARACT EXTRACTION W/PHACO Right 03/04/2021   Procedure: CATARACT EXTRACTION PHACO AND INTRAOCULAR LENS PLACEMENT (IOC) RIGHT VIVITY LENS 8.59 01:15.3;  Surgeon: Mittie Gaskin, MD;  Location: [facility];  Service: Ophthalmology;  Laterality: Right;   COLONOSCOPY     HEMORRHOID SURGERY     ICD     TONSILLECTOMY     Patient Active Problem List   Diagnosis Date Noted   Back pain 11/13/2023   Cervical dystonia 06/15/2022   Neck pain 07/02/2021   Gait abnormality 12/16/2020   Nonintractable headache 09/30/2020   Tinnitus 09/09/2019   History of sudden cardiac arrest 01/09/2019   Viral syndrome 09/06/2018   Seizures (HCC) 03/02/2018   Syncope 12/26/2017   Other social stressor 12/26/2017   Health care maintenance 08/14/2017   Family hx of prostate cancer    Allergy    Insomnia 10/20/2015   Advance care planning 03/08/2014   Memory loss 09/14/2013   Hypoxic brain injury (HCC) 09/14/2013   Medicare annual wellness visit, subsequent 02/16/2013   Shortness of breath 08/10/2011   Implantable cardioverter-defibrillator (ICD) in situ 02/03/2009   Hypothyroidism 11/18/2008   CONTACT DERMATITIS&OTHER ECZEMA DUE  TO PLANTS 02/03/2008   IDIOPATHIC URTICARIA 02/03/2008   Hypertension 02/03/2008   HLD (hyperlipidemia) 2007-11-10   Attention deficit disorder 10-Nov-2007   PVC/VT non sustained 10-Nov-2007   Sleep apnea Nov 10, 2007   HEADACHE Nov 10, 2007   SUDDEN DEATH-aborted 11-10-2007   LOW BACK PAIN SYNDROME 10/11/2007    ONSET DATE: worse since April 2025  REFERRING DIAG:  Z91.81 (ICD-10-CM) - History of fall  R26.9 (ICD-10-CM) - Gait abnormality  M54.9 (ICD-10-CM) - Back pain, unspecified back location, unspecified back pain laterality, unspecified chronicity    THERAPY DIAG:  Muscle weakness (generalized)  Abnormality of  gait and mobility  Difficulty in walking, not elsewhere classified  Other lack of coordination  Unsteadiness on feet  Repeated falls  Chronic midline low back pain with left-sided sciatica  Rationale for Evaluation and Treatment: Rehabilitation  SUBJECTIVE:                                                                                                                                                                                             SUBJECTIVE STATEMENT: Patient reports had a good beach vacation but admits to not really performing his new HEP and reports feeling stiff today.   Pt accompanied by: significant other  PERTINENT HISTORY: Patient reports fall in April with subsequent report of low back. PMH includes: Anoxic brain injury in 1999 due to prolonged cardiac arrest, with residual mild cognitive impairment- Slow worsening over the years. He was most recently seen here in outpatient PT clinic for chronic neck pain with good results - now here with order for abnormal gait and low back pain.   PAIN:  Are you having pain? Yes: NPRS scale: current=2 /10; worst 8/10; best= 0/10 Pain location: Low back; Neck pain Pain description: Sharp with movement Aggravating factors: Transfers, bending, prolonged sitting/standing, any twisting; lifting Relieving factors: pain meds, rest, heat  PRECAUTIONS: Fall  RED FLAGS: None   WEIGHT BEARING RESTRICTIONS: No  FALLS: Has patient fallen in last 6 months? Yes. Number of falls 3  LIVING ENVIRONMENT: Lives with: lives with their spouse Lives in: House/apartment Stairs: No Has following equipment at home: None  PLOF: Independent  PATIENT GOALS: Patient reports wanting to feel better and have less pain and not fall  OBJECTIVE:  Note: Objective measures were completed at Evaluation unless otherwise noted.  DIAGNOSTIC FINDINGS: None recent  COGNITION: Overall cognitive status: History of cognitive impairments - at  baseline   SENSATION: Light touch: WFL  COORDINATION: Delay reaction time with heel to shin and several other tasks during balance testing today.   EDEMA:  None observed  POSTURE: rounded shoulders, forward  head, and stiffness c-spine  LOWER EXTREMITY ROM:     Active  Right Eval Left Eval  Hip flexion    Hip extension    Hip abduction    Hip adduction    Hip internal rotation    Hip external rotation    Knee flexion    Knee extension    Ankle dorsiflexion    Ankle plantarflexion    Ankle inversion    Ankle eversion     (Blank rows = not tested)  LOWER EXTREMITY MMT:    MMT Right Eval Left Eval  Hip flexion 4 4-  Hip extension    Hip abduction 4 4-  Hip adduction    Hip internal rotation 4 4-  Hip external rotation 4 4-  Knee flexion 4 4-  Knee extension 4 4-  Ankle dorsiflexion 4 4  Ankle plantarflexion 4 4  Ankle inversion 4 4  Ankle eversion 4 4  (Blank rows = not tested)  BED MOBILITY:  Findings: Sit to supine Complete Independence Supine to sit Complete Independence Rolling to Right Complete Independence Rolling to Left Complete Independence  TRANSFERS: Sit to stand: SBA  Assistive device utilized: None     Stand to sit: SBA  Assistive device utilized: None      RAMP:  Not tested  CURB:  Not tested  STAIRS: Findings: Number of Stairs: 4, Height of Stairs: 6   , and Comments: use of railings GAIT: Findings: Gait Characteristics: decreased arm swing- Right, decreased arm swing- Left, decreased step length- Right, decreased step length- Left, decreased stance time- Right, decreased stance time- Left, decreased stride length, and shuffling, Distance walked: <100 feet, Assistive device utilized:None, Level of assistance: SBA, and Comments: shuffling- unsteady- stiff with minimal UE sway and stiff neck/posture  FUNCTIONAL TESTS:   5 times sit to stand: 23.62 sec without UE Support *pain LBP Timed up and go (TUG): 23.12 and 23.56 sec without  AD= 23.34 sec avg 6 minute walk test: to be assessed next 1-2 visits 10 meter walk test: 12.36 sec= 0.81 m/s Berg Balance Scale:  Item Test date: 12/22/2023 Test date:  Test date:   Sitting to standing 4. able to stand without using hands and stabilize independently Insert OPRCBERGREEVAL SmartPhrase at re-test date Insert OPRCBERGREEVAL SmartPhrase at re-test date  2. Standing unsupported 4. able to stand safely for 2 minutes    3. Sitting with back unsupported, feet supported 4. able to sit safely and securely for 2 minutes    4. Standing to sitting 4. sits safely with minimal use of hands    5. Pivot transfer  4. able to transfer safely with minor use of hands    6. Standing unsupported with eyes closed 3. able to stand 10 seconds with supervision    7. Standing unsupported with feet together 3. able to place feet together independently and stand 1 minute with supervision    8. Reaching forward with outstretched arms while standing 3. can reach forward 12 cm (5 inches)    9. Pick up object from the floor from standing 3. able to pick up slipper but needs supervision    10. Turning to look behind over left and right shoulders while standing 2. turns sideways but only maintains balance    11. Turn 360 degrees 2. able to turn 360 degrees safely but slowly    12. Place alternate foot on step or stool while standing unsupported 4. ble to stand independently and safely and complete 8  steps in 20 seconds    13. Standing unsupported one foot in front 2. able to take small step independently and hold 30 seconds    14. Standing on one leg 1. tries to lift leg unable to hold 3 seconds but remains standing independently.      Total Score 43/56 Total Score /56 Total Score /56    Functional gait assessment: To be assessed next 1-2 visits  PATIENT SURVEYS:  ABC scale 58.13%    SUBJECTIVE Chief complaint:  Low back  History: chronic but worse after fall earlier this year.  Red flags None of the  following: (bowel/bladder changes, saddle paresthesia, personal history of cancer, chills/fever, night sweats, unrelenting pain, first onset of insidious LBP <20 y/o) Negative  Pain location: low back - central L4-S1 Pain: Present 0/10, Best 0/10, Worst 8/10 Pain quality: aching, dull, and stabbing Radiating pain: No  Numbness/Tingling: No 24 hour pain behavior: Pain with transfers and reaching/bending over Aggravating factors: transfers/reaching/bending Easing factors: rest in recliner, Medication How long can you sit: as long as I want How long can you stand: 10 min before pain stops me How long can you walk: unlimited  History of prior back injury, pain, surgery, or therapy: No Follow-up appointment with MD: No Dominant hand: right Imaging: Yes -X ray Falls in the last 6 months: Yes  Occupational demands: Not currently working  Hobbies: gardening; playing cards Goals: To be as painfree   OBJECTIVE     Gait Wide based gait- no device- stiff nature with decreased UE swing  Posture Lumbar lordosis: WNL Iliac crest height: equal bilaterally Lumbar lateral shift: negative   AROM (degrees) R/L (all movements include overpressure unless otherwise stated) Lumbar forward flexion: 25* Lumbar extension: 10* Lumbar lateral flexion: R: fingers to 2 above knee jt line L:  fingers approx 3 above lateral knee jt. Line*  *Indicates pain     Repeated Movements No centralization or peripheralization of symptoms with repeated lumbar extension or flexion.      Sensation intact to light touch bilateral LEs as determined by testing dermatomes L2-S2.    R Palpation Graded on 0-4 scale (0 = no pain, 1 = pain, 2 = pain with wincing/grimacing/flinching, 3 = pain with withdrawal, 4 = unwilling to allow palpation) Location LEFT  RIGHT           Lumbar paraspinals 2 2  Quadratus Lumborum 0 0  Gluteus Maximus 0 0  Gluteus Medius 0 0  Deep hip external rotators 0 0  PSIS 0 0   Fortin's Area (SIJ) 0 0  Greater Trochanter 0 0     Muscle Length Hamstrings: R: 33 degrees L: 22 degrees      Passive Accessory Intervertebral Motion (PAIVM) Pt reports some reproduction of back pain with CPA L1-L5 and UPA bilaterally L1-L5. Generally hypomobile throughout    SPECIAL TESTS Lumbar Radiculopathy and Discogenic:  Slump (SN 83, -LR 0.32): R: Negative L: Negative SLR (SN 92, -LR 0.29): R: Negative L:  Negative      SIJ:  Thigh Thrust (SN 88, -LR 0.18) : R: Negative L: Negative    Functional Tasks Lifting: limited due to pain Deep squat: not tested Sit to stand: slow and guarded  6 Min Walk Test:  Instructed patient to ambulate as quickly and as safely as possible for 6 minutes using LRAD. Patient was allowed to take standing rest breaks without stopping the test, but if the patient required a sitting rest break the clock would  be stopped and the test would be over.  Results: 950 feet (290 meters, Avg speed .81 m/s) using no AD with CGA. Results indicate that the patient has reduced endurance with ambulation compared to age matched norms.  Age Matched Norms: 44-69 yo M: 70 F: 36, 40-79 yo M: 37 F: 471, 21-89 yo M: 417 F: 392 MDC: 58.21 meters (190.98 feet) or 50 meters (ANPTA Core Set of Outcome Measures for Adults with Neurologic Conditions, 2018)                                                                                                                              TREATMENT DATE: 01/12/2024   Physical Performance Test or Measurement: a  physical performance test or measurement (eg,  musculoskeletal, functional capacity), with written report,  each 15 mins (Reassessment Test and Measures    [facility] PT Assessment - 01/12/24 0936       Standardized Balance Assessment   Standardized Balance Assessment --      Functional Gait  Assessment   Gait assessed  Yes    Gait Level Surface Walks 20 ft in less than 7 sec but greater than 5.5 sec, uses  assistive device, slower speed, mild gait deviations, or deviates 6-10 in outside of the 12 in walkway width.    Change in Gait Speed Able to change speed, demonstrates mild gait deviations, deviates 6-10 in outside of the 12 in walkway width, or no gait deviations, unable to achieve a major change in velocity, or uses a change in velocity, or uses an assistive device.    Gait with Horizontal Head Turns Performs head turns with moderate changes in gait velocity, slows down, deviates 10-15 in outside 12 in walkway width but recovers, can continue to walk.    Gait with Vertical Head Turns Performs task with moderate change in gait velocity, slows down, deviates 10-15 in outside 12 in walkway width but recovers, can continue to walk.    Gait and Pivot Turn Pivot turns safely in greater than 3 sec and stops with no loss of balance, or pivot turns safely within 3 sec and stops with mild imbalance, requires small steps to catch balance.    Step Over Obstacle Is able to step over one shoe box (4.5 in total height) but must slow down and adjust steps to clear box safely. May require verbal cueing.    Gait with Narrow Base of Support Ambulates 4-7 steps.    Gait with Eyes Closed Walks 20 ft, uses assistive device, slower speed, mild gait deviations, deviates 6-10 in outside 12 in walkway width. Ambulates 20 ft in less than 9 sec but greater than 7 sec.    Ambulating Backwards Walks 20 ft, slow speed, abnormal gait pattern, evidence for imbalance, deviates 10-15 in outside 12 in walkway width.    Steps Alternating feet, must use rail.    Total Score 15  THEREX:   -Supine (hooklye) single knee to chest- hold 30 sec x 3 each LE -Supine (hooklye) lower trunk rotation- hold 30 sec x 3  -Supine Piriformis stretch- hold 30 sec x 3 -Supine bridging x 12 reps  -Seated hamstring stretch- hold 30 sec x2    .    PATIENT EDUCATION:    Education details: PT plan of care; Principles/components of  balance and how each plays a vital role; purpose of PT and functional outcome measures. Ed in HEP consisting of tandem standing and SLS. Person educated: Patient and Spouse Education method: Explanation, Demonstration, Tactile cues, and Verbal cues Education comprehension: verbalized understanding, returned demonstration, verbal cues required, tactile cues required, and needs further education  HOME EXERCISE PROGRAM: Access Code: [email or web address] URL: [email or web address] Date: 12/29/2023 Prepared by: Reyes London  Exercises - Supine Single Knee to Chest Stretch  - 1 x daily - 3 sets - 30 hold - Supine Lower Trunk Rotation  - 1 x daily - 3 sets - 30 sec  hold - Seated Hamstring Stretch  - 1 x daily - 3 sets - 30 hold - Supine Piriformis Stretch with Leg Straight  - 1 x daily - 3 sets - 30 hold - Seated 3 Way Exercise Ball Roll Out Stretch  - 1 x daily - 3 sets - 10 reps - Supine Bridge  - 3 x weekly - 3 sets - 10 reps    12/27/2023:Added seated hamstring, Lower trunk rotation, and knee to chest- will update and issue HEP next session. EVAL: Added Tandem standing and SLS- will need to issue handout next visit along with any Low back stretches  GOALS: Goals reviewed with patient? Yes  SHORT TERM GOALS: Target date: 02/02/2024  Pt will be independent with HEP in order to improve strength and balance in order to decrease fall risk and improve function at home. Baseline: EVAL- No formal HEP for balance Goal status: INITIAL  LONG TERM GOALS: Target date: 03/15/2024  1.  Patient will complete five times sit to stand test in < 15 seconds indicating an increased LE strength and improved balance. Baseline: EVAL= 23.62 sec Goal status: INITIAL  2.  Patient will improve ABC score to >70%  to demonstrate statistically significant improvement in mobility and quality of life as it relates to their confidence in balance.  Baseline: EVAL= 58.13% Goal status: INITIAL   3.  Patient  will increase Berg Balance score by > 6 points to demonstrate decreased fall risk during functional activities. Baseline: EVAL= 43/56 Goal status: INITIAL   4.   Patient will reduce timed up and go to <11 seconds to reduce fall risk and demonstrate improved transfer/gait ability. Baseline: EVAL= 23.34 sec without AD Goal status: INITIAL  5.   Patient will increase 10 meter walk test to >1.68m/s as to improve gait speed for better community ambulation and to reduce fall risk. Baseline: EVAL=0.81 m/s without AD Goal status: INITIAL  6.   Patient will increase six minute walk test distance to >1000 for progression to community ambulator and improve gait ability Baseline: EVAL= To be assessed next visit; 12/27/2023= 950 feet without AD Goal status: INITIAL  7. Pt will decrease mODI score by at least 13 points in order demonstrate clinically significant reduction in back pain/disability.   Baseline: will issue next visit; 12/29/2023= 42%  Goal status: NEW  8. Pt will decrease worst back pain as reported on NPRS by at least 2 points in order to demonstrate clinically significant reduction in  back pain.   Baseline: 12/27/2023= 8/10 worst low back pain  Goal status: NEW 9. Patient will demonstrate 50% improved lumbar AROM for improved transfers/bed mobility, and standing posture.   Baseline: 12/27/2023- very restricted ROM (see table above)   Goal status: NEW  Pt will improve FGA by at least 3 points in order to demonstrate clinically significant improvement in balance and decreased risk for falls.  Baseline: 01/12/2024= 15  Goal Status: NEW   ASSESSMENT:  CLINICAL IMPRESSION: Patient is a 73 y.o. male who was seen today for physical therapy evaluation and treatment for chronic back pain. Further assessed dynamic balance and patient presented with impaired balance scoring 15 on FGA indicating increased risk of falling. Reviewed previously instructed low back stretching to improve consistently  and understanding of HEP. Patient will benefit from skilled PT services to address his low back symptoms, improve balance, and decrease risk for future falls.   OBJECTIVE IMPAIRMENTS: Abnormal gait, decreased activity tolerance, decreased balance, decreased cognition, decreased coordination, decreased endurance, decreased mobility, difficulty walking, decreased strength, and pain.   ACTIVITY LIMITATIONS: carrying, lifting, bending, sitting, standing, squatting, stairs, and transfers  PARTICIPATION LIMITATIONS: meal prep, cleaning, laundry, shopping, community activity, and yard work  PERSONAL FACTORS: 1-2 comorbidities: anoxic brain injury; depression are also affecting patient's functional outcome.   REHAB POTENTIAL: Good  CLINICAL DECISION MAKING: Evolving/moderate complexity  EVALUATION COMPLEXITY: Moderate  PLAN:  PT FREQUENCY: 1-2x/week  PT DURATION: 12 weeks  PLANNED INTERVENTIONS: [id number]- PT Re-evaluation, 97750- Physical Performance Testing, 97110-Therapeutic exercises, 97530- Therapeutic activity, [id number]- Neuromuscular re-education, 97535- Self Care, [id number]- Manual therapy, [id number]- Gait training, [phone]- Orthotic Initial, [phone]- Orthotic/Prosthetic subsequent, [phone]- Canalith repositioning, Patient/Family education, Balance training, Stair training, Taping, Joint mobilization, Joint manipulation, Spinal manipulation, Spinal mobilization, Compression bandaging, Vestibular training, DME instructions, Cryotherapy, and Moist heat  PLAN FOR NEXT SESSION:  Review and progress HEP FGA next 1-2 visits    Reyes LOISE London, PT 01/12/2024, 2:06 PM

## 2024-01-16 ENCOUNTER — Ambulatory Visit

## 2024-01-17 ENCOUNTER — Ambulatory Visit

## 2024-01-17 DIAGNOSIS — R2689 Other abnormalities of gait and mobility: Secondary | ICD-10-CM | POA: Diagnosis not present

## 2024-01-17 DIAGNOSIS — R296 Repeated falls: Secondary | ICD-10-CM | POA: Diagnosis not present

## 2024-01-17 DIAGNOSIS — G8929 Other chronic pain: Secondary | ICD-10-CM

## 2024-01-17 DIAGNOSIS — R262 Difficulty in walking, not elsewhere classified: Secondary | ICD-10-CM | POA: Diagnosis not present

## 2024-01-17 DIAGNOSIS — M6281 Muscle weakness (generalized): Secondary | ICD-10-CM | POA: Diagnosis not present

## 2024-01-17 DIAGNOSIS — R2681 Unsteadiness on feet: Secondary | ICD-10-CM

## 2024-01-17 DIAGNOSIS — R269 Unspecified abnormalities of gait and mobility: Secondary | ICD-10-CM

## 2024-01-17 DIAGNOSIS — R278 Other lack of coordination: Secondary | ICD-10-CM | POA: Diagnosis not present

## 2024-01-17 DIAGNOSIS — M5442 Lumbago with sciatica, left side: Secondary | ICD-10-CM | POA: Diagnosis not present

## 2024-01-17 NOTE — Therapy (Signed)
 OUTPATIENT PHYSICAL THERAPY NEURO AND LOW BACK TREATMENT   Patient Name: Larry [name] MRN: [id number] DOB:1950/11/06, 73 y.o., male Today's Date: 01/17/2024   PCP: Dr. Arlyss Solian REFERRING PROVIDER: Dr. Arlyss Solian  END OF SESSION:  PT End of Session - 01/17/24 0942     Visit Number 5    Number of Visits 24    Date for PT Re-Evaluation 03/15/24    Progress Note Due on Visit 10    PT Start Time 0932    PT Stop Time 1013    PT Time Calculation (min) 41 min    Equipment Utilized During Treatment Gait belt    Activity Tolerance Patient tolerated treatment well    Behavior During Therapy WFL for tasks assessed/performed            Past Medical History:  Diagnosis Date   ADD (attention deficit disorder)    Allergy    seasonal flares of perinneal allergies   Cancer (HCC)    melanoma   Cardiac arrest (HCC) 07/1997   aborted   Dementia (HCC)    post resusitative   Depression    Diverticulosis    Family hx of prostate cancer    GERD (gastroesophageal reflux disease)    Headache(784.0)    Hyperlipidemia    Hypothyroidism    ICD (implantable cardiac defibrillator) in place    Idiopathic urticaria    Internal hemorrhoids    Low back pain syndrome    Melanoma in situ of skin of trunk (HCC)    chest   PVC (premature ventricular contraction)    associated with cardiac arrest   Sleep apnea    no c-pap   Tubular adenoma of colon 10/2010   Past Surgical History:  Procedure Laterality Date    implantation x2     CARDIAC DEFIBRILLATOR PLACEMENT  07/01/2010   Explanation of a previously implanted device, pocket revision, and insertion of a new device, and intraoperative defibrillation threshold testing Robyne)   CARDIAC DEFIBRILLATOR REMOVAL  07/05/2002   Defibrillator change Robyne) 3 times changed   CATARACT EXTRACTION W/PHACO Left 02/04/2021   Procedure: CATARACT EXTRACTION PHACO AND INTRAOCULAR LENS PLACEMENT (IOC) LEFT VIVITY toric LENS 9.21 01:11.6;   Surgeon: Mittie Gaskin, MD;  Location: [facility];  Service: Ophthalmology;  Laterality: Left;  keep patient at 11:30 arrival   CATARACT EXTRACTION W/PHACO Right 03/04/2021   Procedure: CATARACT EXTRACTION PHACO AND INTRAOCULAR LENS PLACEMENT (IOC) RIGHT VIVITY LENS 8.59 01:15.3;  Surgeon: Mittie Gaskin, MD;  Location: [facility];  Service: Ophthalmology;  Laterality: Right;   COLONOSCOPY     HEMORRHOID SURGERY     ICD     TONSILLECTOMY     Patient Active Problem List   Diagnosis Date Noted   Back pain 11/13/2023   Cervical dystonia 06/15/2022   Neck pain 07/02/2021   Gait abnormality 12/16/2020   Nonintractable headache 09/30/2020   Tinnitus 09/09/2019   History of sudden cardiac arrest 01/09/2019   Viral syndrome 09/06/2018   Seizures (HCC) 03/02/2018   Syncope 12/26/2017   Other social stressor 12/26/2017   Health care maintenance 08/14/2017   Family hx of prostate cancer    Allergy    Insomnia 10/20/2015   Advance care planning 03/08/2014   Memory loss 09/14/2013   Hypoxic brain injury (HCC) 09/14/2013   Medicare annual wellness visit, subsequent 02/16/2013   Shortness of breath 08/10/2011   Implantable cardioverter-defibrillator (ICD) in situ 02/03/2009   Hypothyroidism 11/18/2008   CONTACT DERMATITIS&OTHER ECZEMA  DUE TO PLANTS 02/03/2008   IDIOPATHIC URTICARIA 02/03/2008   Hypertension 02/03/2008   HLD (hyperlipidemia) 15-Nov-2007   Attention deficit disorder 11/15/2007   PVC/VT non sustained 15-Nov-2007   Sleep apnea 15-Nov-2007   HEADACHE 11/15/2007   SUDDEN DEATH-aborted Nov 15, 2007   LOW BACK PAIN SYNDROME 10/11/2007    ONSET DATE: worse since April 2025  REFERRING DIAG:  Z91.81 (ICD-10-CM) - History of fall  R26.9 (ICD-10-CM) - Gait abnormality  M54.9 (ICD-10-CM) - Back pain, unspecified back location, unspecified back pain laterality, unspecified chronicity    THERAPY DIAG:  Muscle weakness (generalized)  Abnormality of  gait and mobility  Difficulty in walking, not elsewhere classified  Other lack of coordination  Unsteadiness on feet  Repeated falls  Chronic midline low back pain with left-sided sciatica  Other abnormalities of gait and mobility  Rationale for Evaluation and Treatment: Rehabilitation  SUBJECTIVE:                                                                                                                                                                                             SUBJECTIVE STATEMENT: Patient reports back has been bothering him but that he has been very active over past week. Wife present to review the low back stretches for HEP compliance.   Pt accompanied by: significant other  PERTINENT HISTORY: Patient reports fall in April with subsequent report of low back. PMH includes: Anoxic brain injury in 1999 due to prolonged cardiac arrest, with residual mild cognitive impairment- Slow worsening over the years. He was most recently seen here in outpatient PT clinic for chronic neck pain with good results - now here with order for abnormal gait and low back pain.   PAIN:  Are you having pain? Yes: NPRS scale: current=2 /10; worst 8/10; best= 0/10 Pain location: Low back; Neck pain Pain description: Sharp with movement Aggravating factors: Transfers, bending, prolonged sitting/standing, any twisting; lifting Relieving factors: pain meds, rest, heat  PRECAUTIONS: Fall  RED FLAGS: None   WEIGHT BEARING RESTRICTIONS: No  FALLS: Has patient fallen in last 6 months? Yes. Number of falls 3  LIVING ENVIRONMENT: Lives with: lives with their spouse Lives in: House/apartment Stairs: No Has following equipment at home: None  PLOF: Independent  PATIENT GOALS: Patient reports wanting to feel better and have less pain and not fall  OBJECTIVE:  Note: Objective measures were completed at Evaluation unless otherwise noted.  DIAGNOSTIC FINDINGS: None  recent  COGNITION: Overall cognitive status: History of cognitive impairments - at baseline   SENSATION: Light touch: WFL  COORDINATION: Delay reaction time with heel to shin and several other tasks  during balance testing today.   EDEMA:  None observed  POSTURE: rounded shoulders, forward head, and stiffness c-spine  LOWER EXTREMITY ROM:     Active  Right Eval Left Eval  Hip flexion    Hip extension    Hip abduction    Hip adduction    Hip internal rotation    Hip external rotation    Knee flexion    Knee extension    Ankle dorsiflexion    Ankle plantarflexion    Ankle inversion    Ankle eversion     (Blank rows = not tested)  LOWER EXTREMITY MMT:    MMT Right Eval Left Eval  Hip flexion 4 4-  Hip extension    Hip abduction 4 4-  Hip adduction    Hip internal rotation 4 4-  Hip external rotation 4 4-  Knee flexion 4 4-  Knee extension 4 4-  Ankle dorsiflexion 4 4  Ankle plantarflexion 4 4  Ankle inversion 4 4  Ankle eversion 4 4  (Blank rows = not tested)  BED MOBILITY:  Findings: Sit to supine Complete Independence Supine to sit Complete Independence Rolling to Right Complete Independence Rolling to Left Complete Independence  TRANSFERS: Sit to stand: SBA  Assistive device utilized: None     Stand to sit: SBA  Assistive device utilized: None      RAMP:  Not tested  CURB:  Not tested  STAIRS: Findings: Number of Stairs: 4, Height of Stairs: 6   , and Comments: use of railings GAIT: Findings: Gait Characteristics: decreased arm swing- Right, decreased arm swing- Left, decreased step length- Right, decreased step length- Left, decreased stance time- Right, decreased stance time- Left, decreased stride length, and shuffling, Distance walked: <100 feet, Assistive device utilized:None, Level of assistance: SBA, and Comments: shuffling- unsteady- stiff with minimal UE sway and stiff neck/posture  FUNCTIONAL TESTS:   5 times sit to stand: 23.62  sec without UE Support *pain LBP Timed up and go (TUG): 23.12 and 23.56 sec without AD= 23.34 sec avg 6 minute walk test: to be assessed next 1-2 visits 10 meter walk test: 12.36 sec= 0.81 m/s Berg Balance Scale:  Item Test date: 12/22/2023 Test date:  Test date:   Sitting to standing 4. able to stand without using hands and stabilize independently Insert OPRCBERGREEVAL SmartPhrase at re-test date Insert OPRCBERGREEVAL SmartPhrase at re-test date  2. Standing unsupported 4. able to stand safely for 2 minutes    3. Sitting with back unsupported, feet supported 4. able to sit safely and securely for 2 minutes    4. Standing to sitting 4. sits safely with minimal use of hands    5. Pivot transfer  4. able to transfer safely with minor use of hands    6. Standing unsupported with eyes closed 3. able to stand 10 seconds with supervision    7. Standing unsupported with feet together 3. able to place feet together independently and stand 1 minute with supervision    8. Reaching forward with outstretched arms while standing 3. can reach forward 12 cm (5 inches)    9. Pick up object from the floor from standing 3. able to pick up slipper but needs supervision    10. Turning to look behind over left and right shoulders while standing 2. turns sideways but only maintains balance    11. Turn 360 degrees 2. able to turn 360 degrees safely but slowly    12. Place alternate foot on step  or stool while standing unsupported 4. ble to stand independently and safely and complete 8 steps in 20 seconds    13. Standing unsupported one foot in front 2. able to take small step independently and hold 30 seconds    14. Standing on one leg 1. tries to lift leg unable to hold 3 seconds but remains standing independently.      Total Score 43/56 Total Score /56 Total Score /56    Functional gait assessment: To be assessed next 1-2 visits  PATIENT SURVEYS:  ABC scale 58.13%    SUBJECTIVE Chief complaint:  Low back   History: chronic but worse after fall earlier this year.  Red flags None of the following: (bowel/bladder changes, saddle paresthesia, personal history of cancer, chills/fever, night sweats, unrelenting pain, first onset of insidious LBP <20 y/o) Negative  Pain location: low back - central L4-S1 Pain: Present 0/10, Best 0/10, Worst 8/10 Pain quality: aching, dull, and stabbing Radiating pain: No  Numbness/Tingling: No 24 hour pain behavior: Pain with transfers and reaching/bending over Aggravating factors: transfers/reaching/bending Easing factors: rest in recliner, Medication How long can you sit: as long as I want How long can you stand: 10 min before pain stops me How long can you walk: unlimited  History of prior back injury, pain, surgery, or therapy: No Follow-up appointment with MD: No Dominant hand: right Imaging: Yes -X ray Falls in the last 6 months: Yes  Occupational demands: Not currently working  Hobbies: gardening; playing cards Goals: To be as painfree   OBJECTIVE     Gait Wide based gait- no device- stiff nature with decreased UE swing  Posture Lumbar lordosis: WNL Iliac crest height: equal bilaterally Lumbar lateral shift: negative   AROM (degrees) R/L (all movements include overpressure unless otherwise stated) Lumbar forward flexion: 25* Lumbar extension: 10* Lumbar lateral flexion: R: fingers to 2 above knee jt line L:  fingers approx 3 above lateral knee jt. Line*  *Indicates pain     Repeated Movements No centralization or peripheralization of symptoms with repeated lumbar extension or flexion.      Sensation intact to light touch bilateral LEs as determined by testing dermatomes L2-S2.    R Palpation Graded on 0-4 scale (0 = no pain, 1 = pain, 2 = pain with wincing/grimacing/flinching, 3 = pain with withdrawal, 4 = unwilling to allow palpation) Location LEFT  RIGHT           Lumbar paraspinals 2 2  Quadratus Lumborum 0 0   Gluteus Maximus 0 0  Gluteus Medius 0 0  Deep hip external rotators 0 0  PSIS 0 0  Fortin's Area (SIJ) 0 0  Greater Trochanter 0 0     Muscle Length Hamstrings: R: 33 degrees L: 22 degrees      Passive Accessory Intervertebral Motion (PAIVM) Pt reports some reproduction of back pain with CPA L1-L5 and UPA bilaterally L1-L5. Generally hypomobile throughout    SPECIAL TESTS Lumbar Radiculopathy and Discogenic:  Slump (SN 83, -LR 0.32): R: Negative L: Negative SLR (SN 92, -LR 0.29): R: Negative L:  Negative      SIJ:  Thigh Thrust (SN 88, -LR 0.18) : R: Negative L: Negative    Functional Tasks Lifting: limited due to pain Deep squat: not tested Sit to stand: slow and guarded  6 Min Walk Test:  Instructed patient to ambulate as quickly and as safely as possible for 6 minutes using LRAD. Patient was allowed to take standing rest breaks without  stopping the test, but if the patient required a sitting rest break the clock would be stopped and the test would be over.  Results: 950 feet (290 meters, Avg speed .81 m/s) using no AD with CGA. Results indicate that the patient has reduced endurance with ambulation compared to age matched norms.  Age Matched Norms: 17-69 yo M: 50 F: 1, 9-79 yo M: 34 F: 471, 35-89 yo M: 417 F: 392 MDC: 58.21 meters (190.98 feet) or 50 meters (ANPTA Core Set of Outcome Measures for Adults with Neurologic Conditions, 2018)                                                                                                                              TREATMENT DATE: 01/17/2024        THEREX:   -Supine (hooklye) single knee to chest- hold 30 sec x 3 each LE -Supine (hooklye) lower trunk rotation- hold 30 sec x 3  -Seated Piriformis stretch- hold 30 sec x 3 each LE -Seated fig 4 stretch- hold 30 sec x 3 ea LE -Supine bridging x 12 reps  -Seated hamstring stretch- hold 30 sec x2  - Seated lumbar stretch- hold 30 sec x 2 -Sidelye thoracic  rotation (open/closed book) 30 sec x 2   .    PATIENT EDUCATION:    Education details: Exercise technique for HEP  Person educated: Patient and Spouse Education method: Explanation, Demonstration, Tactile cues, and Verbal cues Education comprehension: verbalized understanding, returned demonstration, verbal cues required, tactile cues required, and needs further education  HOME EXERCISE PROGRAM: Access Code: [email or web address] URL: [email or web address] Date: 12/29/2023 Prepared by: Reyes London  Exercises - Supine Single Knee to Chest Stretch  - 1 x daily - 3 sets - 30 hold - Supine Lower Trunk Rotation  - 1 x daily - 3 sets - 30 sec  hold - Seated Hamstring Stretch  - 1 x daily - 3 sets - 30 hold - Supine Piriformis Stretch with Leg Straight  - 1 x daily - 3 sets - 30 hold - Seated 3 Way Exercise Ball Roll Out Stretch  - 1 x daily - 3 sets - 10 reps - Supine Bridge  - 3 x weekly - 3 sets - 10 reps    12/27/2023:Added seated hamstring, Lower trunk rotation, and knee to chest- will update and issue HEP next session. EVAL: Added Tandem standing and SLS- will need to issue handout next visit along with any Low back stretches  GOALS: Goals reviewed with patient? Yes  SHORT TERM GOALS: Target date: 02/02/2024  Pt will be independent with HEP in order to improve strength and balance in order to decrease fall risk and improve function at home. Baseline: EVAL- No formal HEP for balance Goal status: INITIAL  LONG TERM GOALS: Target date: 03/15/2024  1.  Patient will complete five times sit to stand test in < 15 seconds indicating an increased LE strength and improved balance. Baseline: EVAL=  23.62 sec Goal status: INITIAL  2.  Patient will improve ABC score to >70%  to demonstrate statistically significant improvement in mobility and quality of life as it relates to their confidence in balance.  Baseline: EVAL= 58.13% Goal status: INITIAL   3.  Patient will increase  Berg Balance score by > 6 points to demonstrate decreased fall risk during functional activities. Baseline: EVAL= 43/56 Goal status: INITIAL   4.   Patient will reduce timed up and go to <11 seconds to reduce fall risk and demonstrate improved transfer/gait ability. Baseline: EVAL= 23.34 sec without AD Goal status: INITIAL  5.   Patient will increase 10 meter walk test to >1.16m/s as to improve gait speed for better community ambulation and to reduce fall risk. Baseline: EVAL=0.81 m/s without AD Goal status: INITIAL  6.   Patient will increase six minute walk test distance to >1000 for progression to community ambulator and improve gait ability Baseline: EVAL= To be assessed next visit; 12/27/2023= 950 feet without AD Goal status: INITIAL  7. Pt will decrease mODI score by at least 13 points in order demonstrate clinically significant reduction in back pain/disability.   Baseline: will issue next visit; 12/29/2023= 42%  Goal status: NEW  8. Pt will decrease worst back pain as reported on NPRS by at least 2 points in order to demonstrate clinically significant reduction in back pain.   Baseline: 12/27/2023= 8/10 worst low back pain  Goal status: NEW 9. Patient will demonstrate 50% improved lumbar AROM for improved transfers/bed mobility, and standing posture.   Baseline: 12/27/2023- very restricted ROM (see table above)   Goal status: NEW  Pt will improve FGA by at least 3 points in order to demonstrate clinically significant improvement in balance and decreased risk for falls.  Baseline: 01/12/2024= 15  Goal Status: NEW   ASSESSMENT:  CLINICAL IMPRESSION: Patient is a 73 y.o. male who was seen today for physical therapy  treatment for chronic back pain. Patient presented with increased overall low back tightness with some difficulty performing specific stretching including figure 4 and thoracic rotation. He did demonstrate some ability to relax and loosen up but overall still very  limited due to tightness. Wife present for review of therex for HEP compliance. Patient will benefit from skilled PT services to address his low back symptoms, improve balance, and decrease risk for future falls.   OBJECTIVE IMPAIRMENTS: Abnormal gait, decreased activity tolerance, decreased balance, decreased cognition, decreased coordination, decreased endurance, decreased mobility, difficulty walking, decreased strength, and pain.   ACTIVITY LIMITATIONS: carrying, lifting, bending, sitting, standing, squatting, stairs, and transfers  PARTICIPATION LIMITATIONS: meal prep, cleaning, laundry, shopping, community activity, and yard work  PERSONAL FACTORS: 1-2 comorbidities: anoxic brain injury; depression are also affecting patient's functional outcome.   REHAB POTENTIAL: Good  CLINICAL DECISION MAKING: Evolving/moderate complexity  EVALUATION COMPLEXITY: Moderate  PLAN:  PT FREQUENCY: 1-2x/week  PT DURATION: 12 weeks  PLANNED INTERVENTIONS: [id number]- PT Re-evaluation, 97750- Physical Performance Testing, 97110-Therapeutic exercises, 97530- Therapeutic activity, [id number]- Neuromuscular re-education, 97535- Self Care, [id number]- Manual therapy, [id number]- Gait training, [phone]- Orthotic Initial, [phone]- Orthotic/Prosthetic subsequent, [phone]- Canalith repositioning, Patient/Family education, Balance training, Stair training, Taping, Joint mobilization, Joint manipulation, Spinal manipulation, Spinal mobilization, Compression bandaging, Vestibular training, DME instructions, Cryotherapy, and Moist heat  PLAN FOR NEXT SESSION:  Review and progress HEP FGA next 1-2 visits    Reyes LOISE London, PT 01/17/2024, 2:12 PM

## 2024-01-18 ENCOUNTER — Ambulatory Visit

## 2024-01-18 DIAGNOSIS — Z8674 Personal history of sudden cardiac arrest: Secondary | ICD-10-CM | POA: Diagnosis not present

## 2024-01-18 DIAGNOSIS — H9313 Tinnitus, bilateral: Secondary | ICD-10-CM | POA: Diagnosis not present

## 2024-01-18 DIAGNOSIS — H903 Sensorineural hearing loss, bilateral: Secondary | ICD-10-CM | POA: Diagnosis not present

## 2024-01-19 ENCOUNTER — Ambulatory Visit

## 2024-01-19 DIAGNOSIS — R269 Unspecified abnormalities of gait and mobility: Secondary | ICD-10-CM

## 2024-01-19 DIAGNOSIS — G8929 Other chronic pain: Secondary | ICD-10-CM | POA: Diagnosis not present

## 2024-01-19 DIAGNOSIS — R262 Difficulty in walking, not elsewhere classified: Secondary | ICD-10-CM | POA: Diagnosis not present

## 2024-01-19 DIAGNOSIS — M6281 Muscle weakness (generalized): Secondary | ICD-10-CM | POA: Diagnosis not present

## 2024-01-19 DIAGNOSIS — M5442 Lumbago with sciatica, left side: Secondary | ICD-10-CM | POA: Diagnosis not present

## 2024-01-19 DIAGNOSIS — R296 Repeated falls: Secondary | ICD-10-CM | POA: Diagnosis not present

## 2024-01-19 DIAGNOSIS — R2689 Other abnormalities of gait and mobility: Secondary | ICD-10-CM | POA: Diagnosis not present

## 2024-01-19 DIAGNOSIS — R278 Other lack of coordination: Secondary | ICD-10-CM

## 2024-01-19 DIAGNOSIS — R2681 Unsteadiness on feet: Secondary | ICD-10-CM

## 2024-01-19 LAB — CUP PACEART REMOTE DEVICE CHECK
Battery Remaining Longevity: 5 mo
Battery Remaining Percentage: 4 %
Brady Statistic RV Percent Paced: 1 %
Date Time Interrogation Session: 20250716181900
HighPow Impedance: 66 Ohm
Implantable Lead Connection Status: 753985
Implantable Lead Implant Date: 19990204
Implantable Lead Location: 753860
Implantable Lead Model: 135
Implantable Lead Serial Number: 301213
Implantable Pulse Generator Implant Date: 20111228
Lead Channel Impedance Value: 996 Ohm
Lead Channel Pacing Threshold Amplitude: 1.4 V
Lead Channel Pacing Threshold Pulse Width: 1 ms
Lead Channel Setting Pacing Amplitude: 2.4 V
Lead Channel Setting Pacing Pulse Width: 1 ms
Lead Channel Setting Sensing Sensitivity: 0.4 mV
Pulse Gen Serial Number: 277857

## 2024-01-19 NOTE — Therapy (Signed)
 OUTPATIENT PHYSICAL THERAPY NEURO AND LOW BACK TREATMENT   Patient Name: Larry [name] MRN: [id number] DOB:1950/10/08, 73 y.o., male Today's Date: 01/20/2024   PCP: Dr. Arlyss Solian REFERRING PROVIDER: Dr. Arlyss Solian  END OF SESSION:  PT End of Session - 01/19/24 0936     Visit Number 6    Number of Visits 24    Date for PT Re-Evaluation 03/15/24    Progress Note Due on Visit 10    PT Start Time 0932    PT Stop Time 1014    PT Time Calculation (min) 42 min    Equipment Utilized During Treatment Gait belt    Activity Tolerance Patient tolerated treatment well    Behavior During Therapy WFL for tasks assessed/performed            Past Medical History:  Diagnosis Date   ADD (attention deficit disorder)    Allergy    seasonal flares of perinneal allergies   Cancer (HCC)    melanoma   Cardiac arrest (HCC) 07/1997   aborted   Dementia (HCC)    post resusitative   Depression    Diverticulosis    Family hx of prostate cancer    GERD (gastroesophageal reflux disease)    Headache(784.0)    Hyperlipidemia    Hypothyroidism    ICD (implantable cardiac defibrillator) in place    Idiopathic urticaria    Internal hemorrhoids    Low back pain syndrome    Melanoma in situ of skin of trunk (HCC)    chest   PVC (premature ventricular contraction)    associated with cardiac arrest   Sleep apnea    no c-pap   Tubular adenoma of colon 10/2010   Past Surgical History:  Procedure Laterality Date    implantation x2     CARDIAC DEFIBRILLATOR PLACEMENT  07/01/2010   Explanation of a previously implanted device, pocket revision, and insertion of a new device, and intraoperative defibrillation threshold testing Robyne)   CARDIAC DEFIBRILLATOR REMOVAL  07/05/2002   Defibrillator change Robyne) 3 times changed   CATARACT EXTRACTION W/PHACO Left 02/04/2021   Procedure: CATARACT EXTRACTION PHACO AND INTRAOCULAR LENS PLACEMENT (IOC) LEFT VIVITY toric LENS 9.21 01:11.6;   Surgeon: Mittie Gaskin, MD;  Location: [facility];  Service: Ophthalmology;  Laterality: Left;  keep patient at 11:30 arrival   CATARACT EXTRACTION W/PHACO Right 03/04/2021   Procedure: CATARACT EXTRACTION PHACO AND INTRAOCULAR LENS PLACEMENT (IOC) RIGHT VIVITY LENS 8.59 01:15.3;  Surgeon: Mittie Gaskin, MD;  Location: [facility];  Service: Ophthalmology;  Laterality: Right;   COLONOSCOPY     HEMORRHOID SURGERY     ICD     TONSILLECTOMY     Patient Active Problem List   Diagnosis Date Noted   Back pain 11/13/2023   Cervical dystonia 06/15/2022   Neck pain 07/02/2021   Gait abnormality 12/16/2020   Nonintractable headache 09/30/2020   Tinnitus 09/09/2019   History of sudden cardiac arrest 01/09/2019   Viral syndrome 09/06/2018   Seizures (HCC) 03/02/2018   Syncope 12/26/2017   Other social stressor 12/26/2017   Health care maintenance 08/14/2017   Family hx of prostate cancer    Allergy    Insomnia 10/20/2015   Advance care planning 03/08/2014   Memory loss 09/14/2013   Hypoxic brain injury (HCC) 09/14/2013   Medicare annual wellness visit, subsequent 02/16/2013   Shortness of breath 08/10/2011   Implantable cardioverter-defibrillator (ICD) in situ 02/03/2009   Hypothyroidism 11/18/2008   CONTACT DERMATITIS&OTHER ECZEMA  DUE TO PLANTS 02/03/2008   IDIOPATHIC URTICARIA 02/03/2008   Hypertension 02/03/2008   HLD (hyperlipidemia) 11-25-2007   Attention deficit disorder 11-25-2007   PVC/VT non sustained Nov 25, 2007   Sleep apnea 11-25-2007   HEADACHE Nov 25, 2007   SUDDEN DEATH-aborted Nov 25, 2007   LOW BACK PAIN SYNDROME 10/11/2007    ONSET DATE: worse since April 2025  REFERRING DIAG:  Z91.81 (ICD-10-CM) - History of fall  R26.9 (ICD-10-CM) - Gait abnormality  M54.9 (ICD-10-CM) - Back pain, unspecified back location, unspecified back pain laterality, unspecified chronicity    THERAPY DIAG:  Muscle weakness (generalized)  Abnormality of  gait and mobility  Difficulty in walking, not elsewhere classified  Other lack of coordination  Unsteadiness on feet  Rationale for Evaluation and Treatment: Rehabilitation  SUBJECTIVE:                                                                                                                                                                                             SUBJECTIVE STATEMENT: Patient reports doing better and wife reports she helped him stretch some last night.   Pt accompanied by: significant other  PERTINENT HISTORY: Patient reports fall in April with subsequent report of low back. PMH includes: Anoxic brain injury in 1999 due to prolonged cardiac arrest, with residual mild cognitive impairment- Slow worsening over the years. He was most recently seen here in outpatient PT clinic for chronic neck pain with good results - now here with order for abnormal gait and low back pain.   PAIN:  Are you having pain? Yes: NPRS scale: current=2 /10; worst 8/10; best= 0/10 Pain location: Low back; Neck pain Pain description: Sharp with movement Aggravating factors: Transfers, bending, prolonged sitting/standing, any twisting; lifting Relieving factors: pain meds, rest, heat  PRECAUTIONS: Fall  RED FLAGS: None   WEIGHT BEARING RESTRICTIONS: No  FALLS: Has patient fallen in last 6 months? Yes. Number of falls 3  LIVING ENVIRONMENT: Lives with: lives with their spouse Lives in: House/apartment Stairs: No Has following equipment at home: None  PLOF: Independent  PATIENT GOALS: Patient reports wanting to feel better and have less pain and not fall  OBJECTIVE:  Note: Objective measures were completed at Evaluation unless otherwise noted.  DIAGNOSTIC FINDINGS: None recent  COGNITION: Overall cognitive status: History of cognitive impairments - at baseline   SENSATION: Light touch: WFL  COORDINATION: Delay reaction time with heel to shin and several other tasks  during balance testing today.   EDEMA:  None observed  POSTURE: rounded shoulders, forward head, and stiffness c-spine  LOWER EXTREMITY ROM:     Active  Right Eval Left Eval  Hip flexion    Hip extension    Hip abduction    Hip adduction    Hip internal rotation    Hip external rotation    Knee flexion    Knee extension    Ankle dorsiflexion    Ankle plantarflexion    Ankle inversion    Ankle eversion     (Blank rows = not tested)  LOWER EXTREMITY MMT:    MMT Right Eval Left Eval  Hip flexion 4 4-  Hip extension    Hip abduction 4 4-  Hip adduction    Hip internal rotation 4 4-  Hip external rotation 4 4-  Knee flexion 4 4-  Knee extension 4 4-  Ankle dorsiflexion 4 4  Ankle plantarflexion 4 4  Ankle inversion 4 4  Ankle eversion 4 4  (Blank rows = not tested)  BED MOBILITY:  Findings: Sit to supine Complete Independence Supine to sit Complete Independence Rolling to Right Complete Independence Rolling to Left Complete Independence  TRANSFERS: Sit to stand: SBA  Assistive device utilized: None     Stand to sit: SBA  Assistive device utilized: None      RAMP:  Not tested  CURB:  Not tested  STAIRS: Findings: Number of Stairs: 4, Height of Stairs: 6   , and Comments: use of railings GAIT: Findings: Gait Characteristics: decreased arm swing- Right, decreased arm swing- Left, decreased step length- Right, decreased step length- Left, decreased stance time- Right, decreased stance time- Left, decreased stride length, and shuffling, Distance walked: <100 feet, Assistive device utilized:None, Level of assistance: SBA, and Comments: shuffling- unsteady- stiff with minimal UE sway and stiff neck/posture  FUNCTIONAL TESTS:   5 times sit to stand: 23.62 sec without UE Support *pain LBP Timed up and go (TUG): 23.12 and 23.56 sec without AD= 23.34 sec avg 6 minute walk test: to be assessed next 1-2 visits 10 meter walk test: 12.36 sec= 0.81 m/s Berg Balance  Scale:  Item Test date: 12/22/2023 Test date:  Test date:   Sitting to standing 4. able to stand without using hands and stabilize independently Insert OPRCBERGREEVAL SmartPhrase at re-test date Insert OPRCBERGREEVAL SmartPhrase at re-test date  2. Standing unsupported 4. able to stand safely for 2 minutes    3. Sitting with back unsupported, feet supported 4. able to sit safely and securely for 2 minutes    4. Standing to sitting 4. sits safely with minimal use of hands    5. Pivot transfer  4. able to transfer safely with minor use of hands    6. Standing unsupported with eyes closed 3. able to stand 10 seconds with supervision    7. Standing unsupported with feet together 3. able to place feet together independently and stand 1 minute with supervision    8. Reaching forward with outstretched arms while standing 3. can reach forward 12 cm (5 inches)    9. Pick up object from the floor from standing 3. able to pick up slipper but needs supervision    10. Turning to look behind over left and right shoulders while standing 2. turns sideways but only maintains balance    11. Turn 360 degrees 2. able to turn 360 degrees safely but slowly    12. Place alternate foot on step or stool while standing unsupported 4. ble to stand independently and safely and complete 8 steps in 20 seconds    13. Standing unsupported one foot in front 2. able to take small  step independently and hold 30 seconds    14. Standing on one leg 1. tries to lift leg unable to hold 3 seconds but remains standing independently.      Total Score 43/56 Total Score /56 Total Score /56    Functional gait assessment: To be assessed next 1-2 visits  PATIENT SURVEYS:  ABC scale 58.13%    SUBJECTIVE Chief complaint:  Low back  History: chronic but worse after fall earlier this year.  Red flags None of the following: (bowel/bladder changes, saddle paresthesia, personal history of cancer, chills/fever, night sweats, unrelenting pain,  first onset of insidious LBP <20 y/o) Negative  Pain location: low back - central L4-S1 Pain: Present 0/10, Best 0/10, Worst 8/10 Pain quality: aching, dull, and stabbing Radiating pain: No  Numbness/Tingling: No 24 hour pain behavior: Pain with transfers and reaching/bending over Aggravating factors: transfers/reaching/bending Easing factors: rest in recliner, Medication How long can you sit: as long as I want How long can you stand: 10 min before pain stops me How long can you walk: unlimited  History of prior back injury, pain, surgery, or therapy: No Follow-up appointment with MD: No Dominant hand: right Imaging: Yes -X ray Falls in the last 6 months: Yes  Occupational demands: Not currently working  Hobbies: gardening; playing cards Goals: To be as painfree   OBJECTIVE     Gait Wide based gait- no device- stiff nature with decreased UE swing  Posture Lumbar lordosis: WNL Iliac crest height: equal bilaterally Lumbar lateral shift: negative   AROM (degrees) R/L (all movements include overpressure unless otherwise stated) Lumbar forward flexion: 25* Lumbar extension: 10* Lumbar lateral flexion: R: fingers to 2 above knee jt line L:  fingers approx 3 above lateral knee jt. Line*  *Indicates pain     Repeated Movements No centralization or peripheralization of symptoms with repeated lumbar extension or flexion.      Sensation intact to light touch bilateral LEs as determined by testing dermatomes L2-S2.    R Palpation Graded on 0-4 scale (0 = no pain, 1 = pain, 2 = pain with wincing/grimacing/flinching, 3 = pain with withdrawal, 4 = unwilling to allow palpation) Location LEFT  RIGHT           Lumbar paraspinals 2 2  Quadratus Lumborum 0 0  Gluteus Maximus 0 0  Gluteus Medius 0 0  Deep hip external rotators 0 0  PSIS 0 0  Fortin's Area (SIJ) 0 0  Greater Trochanter 0 0     Muscle Length Hamstrings: R: 33 degrees L: 22 degrees      Passive  Accessory Intervertebral Motion (PAIVM) Pt reports some reproduction of back pain with CPA L1-L5 and UPA bilaterally L1-L5. Generally hypomobile throughout    SPECIAL TESTS Lumbar Radiculopathy and Discogenic:  Slump (SN 83, -LR 0.32): R: Negative L: Negative SLR (SN 92, -LR 0.29): R: Negative L:  Negative      SIJ:  Thigh Thrust (SN 88, -LR 0.18) : R: Negative L: Negative    Functional Tasks Lifting: limited due to pain Deep squat: not tested Sit to stand: slow and guarded  6 Min Walk Test:  Instructed patient to ambulate as quickly and as safely as possible for 6 minutes using LRAD. Patient was allowed to take standing rest breaks without stopping the test, but if the patient required a sitting rest break the clock would be stopped and the test would be over.  Results: 950 feet (290 meters, Avg speed .81 m/s) using  no AD with CGA. Results indicate that the patient has reduced endurance with ambulation compared to age matched norms.  Age Matched Norms: 32-69 yo M: 55 F: 69, 2-79 yo M: 103 F: 471, 50-89 yo M: 417 F: 392 MDC: 58.21 meters (190.98 feet) or 50 meters (ANPTA Core Set of Outcome Measures for Adults with Neurologic Conditions, 2018)                                                                                                                              TREATMENT DATE: 01/19/2024        THEREX:  -Seated hamstring stretch- hold 30 sec x2  -Seated Piriformis stretch- hold 30 sec x 3 each LE -Seated fig 4 stretch- hold 30 sec x 3 ea LE -Supine bridging 2 x 12 reps  - Seated lumbar flex x 10 reps x 2 -Sit to stand x 10 reps with TrA contraction -Supine- Lower trunk rotation x 20 reps     .    PATIENT EDUCATION:    Education details: Exercise technique for HEP  Person educated: Patient and Spouse Education method: Explanation, Demonstration, Tactile cues, and Verbal cues Education comprehension: verbalized understanding, returned demonstration,  verbal cues required, tactile cues required, and needs further education  HOME EXERCISE PROGRAM: Access Code: [email or web address] URL: [email or web address] Date: 01/19/2024 Prepared by: Reyes London  Exercises - Seated Hamstring Stretch  - 1 x daily - 3 sets - 30 sec hold - Seated Piriformis Stretch with Trunk Bend  - 1 x daily - 3 sets - 30 sec  hold - Seated Piriformis Stretch  - 1 x daily - 3 sets - 30 hold - Seated Lumbar Flexion Stretch  - 1 x daily - 3 sets - 30 hold - Sidelying Thoracic Lumbar Rotation  - 1 x daily - 3 sets - 30 hold - Supine Bridge  - 3 x weekly - 3 sets - 10 reps - Sit to Stand with Arms Crossed  - 3 x weekly - 3 sets - 10 reps       Access Code: [email or web address] URL: [email or web address] Date: 12/29/2023 Prepared by: Reyes London  Exercises - Supine Single Knee to Chest Stretch  - 1 x daily - 3 sets - 30 hold - Supine Lower Trunk Rotation  - 1 x daily - 3 sets - 30 sec  hold - Seated Hamstring Stretch  - 1 x daily - 3 sets - 30 hold - Supine Piriformis Stretch with Leg Straight  - 1 x daily - 3 sets - 30 hold - Seated 3 Way Exercise Ball Roll Out Stretch  - 1 x daily - 3 sets - 10 reps - Supine Bridge  - 3 x weekly - 3 sets - 10 reps    12/27/2023:Added seated hamstring, Lower trunk rotation, and knee to chest- will update and issue HEP next session. EVAL: Added Tandem standing and SLS- will need to issue handout next visit along with any  Low back stretches  GOALS: Goals reviewed with patient? Yes  SHORT TERM GOALS: Target date: 02/02/2024  Pt will be independent with HEP in order to improve strength and balance in order to decrease fall risk and improve function at home. Baseline: EVAL- No formal HEP for balance Goal status: INITIAL  LONG TERM GOALS: Target date: 03/15/2024  1.  Patient will complete five times sit to stand test in < 15 seconds indicating an increased LE strength and improved balance. Baseline: EVAL=  23.62 sec Goal status: INITIAL  2.  Patient will improve ABC score to >70%  to demonstrate statistically significant improvement in mobility and quality of life as it relates to their confidence in balance.  Baseline: EVAL= 58.13% Goal status: INITIAL   3.  Patient will increase Berg Balance score by > 6 points to demonstrate decreased fall risk during functional activities. Baseline: EVAL= 43/56 Goal status: INITIAL   4.   Patient will reduce timed up and go to <11 seconds to reduce fall risk and demonstrate improved transfer/gait ability. Baseline: EVAL= 23.34 sec without AD Goal status: INITIAL  5.   Patient will increase 10 meter walk test to >1.24m/s as to improve gait speed for better community ambulation and to reduce fall risk. Baseline: EVAL=0.81 m/s without AD Goal status: INITIAL  6.   Patient will increase six minute walk test distance to >1000 for progression to community ambulator and improve gait ability Baseline: EVAL= To be assessed next visit; 12/27/2023= 950 feet without AD Goal status: INITIAL  7. Pt will decrease mODI score by at least 13 points in order demonstrate clinically significant reduction in back pain/disability.   Baseline: will issue next visit; 12/29/2023= 42%  Goal status: NEW  8. Pt will decrease worst back pain as reported on NPRS by at least 2 points in order to demonstrate clinically significant reduction in back pain.   Baseline: 12/27/2023= 8/10 worst low back pain  Goal status: NEW 9. Patient will demonstrate 50% improved lumbar AROM for improved transfers/bed mobility, and standing posture.   Baseline: 12/27/2023- very restricted ROM (see table above)   Goal status: NEW  Pt will improve FGA by at least 3 points in order to demonstrate clinically significant improvement in balance and decreased risk for falls.  Baseline: 01/12/2024= 15  Goal Status: NEW   ASSESSMENT:  CLINICAL IMPRESSION: Patient is a 73 y.o. male who was seen today for  physical therapy treatment for chronic back pain. Patient able to demonstrate improved ability to count reps and breathe today. He was still extremely tight and some apraxia - multiple cues for correct technique with activities.  Patient will benefit from skilled PT services to address his low back symptoms, improve balance, and decrease risk for future falls.   OBJECTIVE IMPAIRMENTS: Abnormal gait, decreased activity tolerance, decreased balance, decreased cognition, decreased coordination, decreased endurance, decreased mobility, difficulty walking, decreased strength, and pain.   ACTIVITY LIMITATIONS: carrying, lifting, bending, sitting, standing, squatting, stairs, and transfers  PARTICIPATION LIMITATIONS: meal prep, cleaning, laundry, shopping, community activity, and yard work  PERSONAL FACTORS: 1-2 comorbidities: anoxic brain injury; depression are also affecting patient's functional outcome.   REHAB POTENTIAL: Good  CLINICAL DECISION MAKING: Evolving/moderate complexity  EVALUATION COMPLEXITY: Moderate  PLAN:  PT FREQUENCY: 1-2x/week  PT DURATION: 12 weeks  PLANNED INTERVENTIONS: [id number]- PT Re-evaluation, 97750- Physical Performance Testing, 97110-Therapeutic exercises, 97530- Therapeutic activity, [id number]- Neuromuscular re-education, 97535- Self Care, [id number]- Manual therapy, [id number]- Gait training, [id number]- Orthotic Initial, [id number]- Orthotic/Prosthetic subsequent, [phone]- Canalith  repositioning, Patient/Family education, Balance training, Stair training, Taping, Joint mobilization, Joint manipulation, Spinal manipulation, Spinal mobilization, Compression bandaging, Vestibular training, DME instructions, Cryotherapy, and Moist heat  PLAN FOR NEXT SESSION:  Review and progress HEP FGA next 1-2 visits    Reyes LOISE London, PT 01/20/2024, 8:18 AM

## 2024-01-21 ENCOUNTER — Ambulatory Visit: Payer: Self-pay | Admitting: Cardiology

## 2024-01-23 ENCOUNTER — Ambulatory Visit

## 2024-01-23 ENCOUNTER — Ambulatory Visit: Admitting: Neurology

## 2024-01-23 VITALS — BP 138/89

## 2024-01-23 DIAGNOSIS — G931 Anoxic brain damage, not elsewhere classified: Secondary | ICD-10-CM

## 2024-01-23 DIAGNOSIS — G243 Spasmodic torticollis: Secondary | ICD-10-CM | POA: Diagnosis not present

## 2024-01-23 DIAGNOSIS — R569 Unspecified convulsions: Secondary | ICD-10-CM | POA: Diagnosis not present

## 2024-01-23 DIAGNOSIS — M542 Cervicalgia: Secondary | ICD-10-CM

## 2024-01-23 MED ORDER — ONABOTULINUMTOXINA 100 UNITS IJ SOLR
100.0000 [IU] | Freq: Once | INTRAMUSCULAR | Status: AC
  Administered 2024-01-23: 100 [IU] via INTRAMUSCULAR

## 2024-01-23 NOTE — Progress Notes (Signed)
 ASSESSMENT AND PLAN 73 y.o. year old male   Anoxic brain injury in 1999 due to prolonged cardiac arrest, Slow worsening cognitive impairment  Slow worsening over the years, MMSE 23/30 in July 2025.  Continue Aricept  10 mg daily, Namenda  10 mg twice a day  Lab evaluation in 2025 showed slight elevation of TSH, on thyroid  supplement, otherwise no treatable etiology    Seizure  Most recent in June 2019, EEG was normal  Continue lamotrigine  100 mg twice a day  Gait abnormality, worsening neck pain, limited range of motion, abnormal neck posturing, mild anterocollis, right tilt, significant tenderness along cervical paraspinal muscles, worse on the right side   CT cervical spine in June 2023, multilevel degenerative changes, severe right-sided C1-C2 osteoarthritis, variable degree of foraminal narrowing, no significant canal stenosis  He failed epidural injection, complains of increased confusion dizziness, unsteadiness with tizanidine, tramadol treatment,  Responded well to low-dose Botox  injection, and physical therapy     EMG guided Botox  injection, used 100 units today (100 units dissolving to 2 cc of normal saline)  Right levator scapular 25 units  Right semispinalis 12.5 units Right splenius capitis 12.5 units Right splenius cervix 12.5 units    Left levator scapular 25 units  Left semispinalis 12.5 units Left splenius capitis 12.5 units Left splenius cervix 12.5 units   He tolerated injection well, will return to clinic in 3 months for repeat injection  DIAGNOSTIC DATA (LABS, IMAGING, TESTING) - I reviewed patient records, labs, notes, testing and imaging myself where available.   HISTORY OF PRESENT ILLNESS:  Mr. Culton is a very friendly 73 year old right-handed gentleman presents for followup consultation of his memory loss in the context of anoxic brain injury.    He is accompanied by his wife today. He was a patient of Dr. Maurice, The patient has an underlying  medical history of cardiac death from ventricular fibrillation with hypoxic brain injury in Jan 1999 due to arrythmia, 15 minutes without heart beat,  status post defibrillator placement, He stayed in hospital for 35 days, required prolonged rehabilitation, has to relearn walking again, significant amnesia, as if that he did not have a past.   EEG showed diffuse slowing in January 1999, CT head and brain MRI were normal. CT head repeat in November 2000 showed slight prominence of the lateral and third ventricles. CT head in July 2003 showed prominence of the ventricular system. Psychological testing and August 1999 showed borderline performance IQ. He became depressed. In January 2014 his MMSE was 29, clock drawing was 4, animal fluency was 10.  He went to [facility] for 2 years for patient with degenerative disorder.   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 He lives at home with his wife, still driving.   He helps the household, he still has difficulty focusing.    He has had some fluctuation in his memory per wife. He has ADD and continues to take Adderall, 10 mg twice a day, without Adderall, he could not remember  the pages that he has read,he is also taking Aricept  10 mg every day, which has been very helpful too.   Up date April 14 2016YY   His overall doing very well, he likes play computer games, which has helped his eye and hands coordination, he had recent trip to [place], felt overwhelming, he continued to drive short distance,   He is taking Ritalin 10 mg twice a  day, Aricept  10 mg once a day, complains of insomnia, he took his last dose of Ritalin at evening time.   UPDATE Apr 21 2015:YY He is now taking Namenda  10mg  bid and aricept  10mg  qday, he still has trouble sleeping,  He watch movie, stay up late, he continues to have focus difficulty, tends to become agitated.   UPDATE April 27th 2017: YY He still  has chronic insomnia, tends to have irregular sleep pattern, stay up late watching TV, he still very active during the day, take Adderall 10 mg twice a day, still drives short distance, mild unsteady gait, tends to hold his left arm to his chest   UPDATE Oct 17th 2017:YY He is with his wife,  Her daughter was hospital, presented with sudden loss of consciousness yesterday April 19 2016, which has brought back a lot of his memory, he has not tried clonazepam  for chronic insomnia, worry about side effect, tried melatonin, still has significant insomnia, he is taking Tylenol  pm,    UPDATE March 02 2018: He was follow-up by Aleck in 2018, on December 19, 2017, visiting his daughter at [place] , in the morning time, he had sudden onset dizziness, feels like going to pass out, then had transient loss of consciousness, eyes rolled back, staring, lasting for 1 minutes, followed by postevent mild confusion and slurred speech last about 40 minutes, was treated at local hospital.   I personally reviewed CT head without contrast: There was no acute abnormality, generalized atrophy, ventriculomegaly,  laboratory evaluation, EKG showed no significant abnormality.   Similar spells in March 2019, transient dizziness followed by sudden loss of muscle tone, consciousness, extreme fatigue, slept for 1 hour afterwards,   Patient reported that he actually has frequent spells about once a month, he has transient confusion, feels like he is going to pass out, short lasting,   UPDATE Jun 05 2018: He tolerated lamotrigine  very well, taking 100 mg twice a day without significant side effect,   EEG was normal.  Since lamotrigine , he no longer has recurrent spells of rising sensation transient confusion even though there was no clinical seizure activity observed during those spells, he had those spells about once a week prior to lamotrigine ,  UPDATE Jun 19 2020: Is accompanied by his wife at today's clinical visit, slow  worsening memory loss, when he goes to a different room, he often forgets why is he there, slight worsening gait abnormality, he also complains of worsening tinnitus, difficulty sleeping sometimes,  I personally reviewed CT temporal bone w/wo in April 2021:  1. Negative CT appearance of the bilateral temporal bones. No explanation for hearing loss. 2. Note a mildly high riding right jugular bulb, with thin but intact overlying bony covering. 3. Hyperplastic but clear visible paranasal sinuses.  UPDATE March 29th 2022: He used to have headache even before his cardiac arrest, only occasionally, mild, his 2 daughter does has typical migraine headache,  Over the years, he has occasionally headache usually short lasting, on March 26, he complains of transient sharp pain shooting from the left parietal region downward, lasting for few seconds, then had a trickle of pressure headache, take ibuprofen, which did improve  Next morning March 27 Sunday, while at church, at the end of the sermon, he was noted to holding his left side, he also noticed transient blurry vision, was brought to the emergency room,  I personally reviewed CT head with contrast, no acute intracranial abnormality, no venous sinus thrombosis, arterial branch that was visualized  showed no significant abnormality  Laboratory showed normal ESR, negative troponin, normal CBC, BMP with exception of elevated glucose 140, was seen by ophthalmology today, no significant abnormality found, was told he has cataract, left side was slightly worse than the right  Update June 14th 2022: I reviewed his headache diary, few episode in March, transient sharp, only 1 episode on October 19, 2020, no longer has frequent headaches, reported worsening vision bilaterally, left worse than right, on today's near chart examination, visual acuity was 20/50 OS/OD,  Also complains of worsening gait abnormality, wife noticed worsening memory loss,  We personally  reviewed CT of temporal bone with without contrast in April 2021 for evaluation of his reported hearing loss, no acute abnormality, CT venogram of brain on September 28, 2020 that was normal  Update July 02, 2021: Is accompanied by his wife, continued slow worsening, increased memory loss, slow reaction time, continue to drive, wife has to be vigilant sitting at the passenger side, MoCA examination 22/30 today,  Over the past few months, he had few episode of transient ice pricking pain, not long-lasting, He also complains of back pain, rigidity, decreased range of motion  UPDATE December 21 2021: He continues to have slowing memory loss, MoCA examination 25/30, actually improved from previous memory of 22/30, he has slower reaction time, take time to figure out things,  He did have a history of obstructive sleep apnea, tried different facial mask, including nasal cannula in the past, could not tolerate it,  He also complains of worsening neck pain, stiffness, limited range of motion of his neck, somewhat improvement by physical therapy and dry needling, he denies bowel and bladder incontinence  Personally reviewed x-ray of cervical spine, mild to moderate multilevel cervical degenerative disc disease worse at C6-7  UPDATE Jun 15 2022: He is with his wife, complains of worsening short term memory loss, difficulty multitasking, feel frustrated easily,  He complains of worsening neck pain, difficulty turning his neck, 8 out of 10 constant achy pain, worsening shooting pain to right shoulder, occipital region moving his neck, especially looking to the left, underwent epidural injection without help his symptoms much.  UPDATE Aug 11 2022: Here for his first EMG guided Botox  a injection for severe neck pain, especially at the right side CT cervical spine June 2023, multilevel degenerative changes, severe right-sided C1-C2 osteoarthritis, tight right cervical paraspinal muscles, tenderness upon deep  palpitation,  UPDATE Nov 08 2022: Moderate improvement of his neck pain following his initial Botox  injection in February 2024, used 200 units, no ill effect noticed  Wife complains of worsening cognitive impairment, climbing up a ladder, almost fell  UPDATE Jan 28th 2025: Every 3 months low-dose Botox  injection did help his neck pain, no significant side effect, he tends to have right cervical paraspinal muscle tension, achy pain  Wife reported to him having worsening memory loss, gait abnormality, was not sure about the benefit of Aricept , Namenda , but tolerating it well  UPDATE April 28th 2025: He did well with previous injection, fell couple times, now with low back pain, doing physical therapy   UPDATE January 23 2024: Low-dose Botox  injection every 3 months has helped his neck pain, he continue has slowly worsening memory loss, Mini-Mental status examination 23/30  REVIEW OF SYSTEMS: Out of a complete 14 system review of symptoms, the patient complains only of the following symptoms, and all other reviewed systems are negative.  Memory loss, seizures  PHYSICAL EXAM  Vitals:   06/15/22 1304  BP: (!) 142/89  Pulse: 78  Weight: 179 lb 6.4 oz (81.4 kg)  Height: 5' 9 (1.753 m)   Body mass index is 26.49 kg/m.  Generalized: Well developed, in no acute distress      01/23/2024   10:00 AM 10/03/2020    2:54 PM 12/11/2019    9:58 AM  MMSE - Mini Mental State Exam  Not completed:  Refused   Orientation to time 5  5  Orientation to Place 5  5  Registration 3  3  Attention/ Calculation 2  5  Recall 1  1  Language- name 2 objects 2  2  Language- repeat 1  1  Language- follow 3 step command 3  3  Language- read & follow direction 1  1  Write a sentence 0  1  Copy design 0  1  Copy design-comments   12 animals  Total score 23  28    NEUROLOGICAL EXAM:  Neck: Limited range of motion, of neck, looking to either left or right, tenderness of cervical paraspinal muscles upon deep  palpation, especially right levator scapula, upper and lower cervical regions, neck tilt to right shoulder, anterocollis,  MENTAL STATUS: Speech/Cognition: Slow reaction time time, cooperative on examination  CRANIAL NERVES: CN II: Visual fields are full to confrontation.  Pupils are small, reactive CN III, IV, VI: extraocular movement are normal. No ptosis. CN V: Facial sensation is intact to light touch. CN VII: Face is symmetric with normal eye closure and smile. CN VIII: Hearing is normal to casual conversation CN IX, X: Palate elevates symmetrically. Phonation is normal. CN XI: Head turning and shoulder shrug are intact  MOTOR:  Fixation left upper extremity on rapid rotating movement  REFLEXES: Reflexes are 2  and symmetric at the biceps, triceps, knees and ankles. Plantar responses are flexor.  SENSORY: Intact to light touch, pinprick, positional and vibratory sensation at fingers and toes.  COORDINATION: There is no trunk or limb ataxia.    GAIT/STANCE: He needs push-up to get up from seated position, tends to hold his left arm in elbow flexion, mildly wide-based, unsteady   ALLERGIES: Allergies  Allergen Reactions   Penicillins     Difficult Breathing, rash    HOME MEDICATIONS: Outpatient Medications Prior to Visit  Medication Sig Dispense Refill   amphetamine -dextroamphetamine  (ADDERALL) 10 MG tablet Take 1 tablet (10 mg total) by mouth 2 (two) times daily. 180 tablet 0   Ascorbic Acid (VITAMIN C) 500 MG tablet Take 500 mg by mouth daily.     aspirin 81 MG tablet Take 81 mg by mouth daily.     botulinum toxin Type A  (BOTOX ) 200 units injection Inject 200 units into the muscles every 3 months 1 each 3   Cholecalciferol (VITAMIN D3) 10 MCG (400 UNIT) tablet Take 400 Units by mouth daily.     diltiazem  (CARDIZEM  CD) 120 MG 24 hr capsule TAKE 1 CAPSULE BY MOUTH DAILY 100 capsule 2   donepezil  (ARICEPT ) 10 MG tablet Take 1 tablet (10 mg total) by mouth daily. 90  tablet 3   fexofenadine (ALLEGRA) 180 MG tablet Take 180 mg by mouth daily.     Ginkgo Biloba 40 MG TABS Take by mouth as directed.     lamoTRIgine  (LAMICTAL ) 100 MG tablet Take 1 tablet (100 mg total) by mouth 2 (two) times daily. 180 tablet 3   levothyroxine  (SYNTHROID ) 150 MCG tablet Take 1 tablet (150 mcg total) by mouth daily before breakfast. 90 tablet 3  Melatonin 5 MG CAPS Take by mouth as directed.     memantine  (NAMENDA ) 10 MG tablet Take 1 tablet (10 mg total) by mouth 2 (two) times daily. 180 tablet 3   metoprolol  succinate (TOPROL -XL) 25 MG 24 hr tablet Take 1 tablet (25 mg total) by mouth daily. Will need visit for further refills. 100 tablet 3   montelukast  (SINGULAIR ) 10 MG tablet Take 1 tablet (10 mg total) by mouth daily. 100 tablet 3   Multiple Vitamin (MULTIVITAMIN) tablet Take 700 tablets by mouth daily.     NON FORMULARY Focus factor 1 tab po bid     rosuvastatin  (CRESTOR ) 20 MG tablet Take 1 tablet (20 mg total) by mouth at bedtime. 100 tablet 3   tiZANidine (ZANAFLEX) 4 MG tablet Take 2-4 mg by mouth 2 (two) times daily as needed.     traMADol (ULTRAM) 50 MG tablet Take 50 mg by mouth 2 (two) times daily as needed.     vitamin B-12 (CYANOCOBALAMIN) 1000 MCG tablet Take 1,000 mcg by mouth daily.     vitamin E 180 MG (400 UNITS) capsule Take 400 Units by mouth daily.     No facility-administered medications prior to visit.    PAST MEDICAL HISTORY: Past Medical History:  Diagnosis Date   ADD (attention deficit disorder)    Allergy    seasonal flares of perinneal allergies   Cancer (HCC)    melanoma   Cardiac arrest (HCC) 07/1997   aborted   Dementia (HCC)    post resusitative   Depression    Diverticulosis    Family hx of prostate cancer    GERD (gastroesophageal reflux disease)    Headache(784.0)    Hyperlipidemia    Hypothyroidism    ICD (implantable cardiac defibrillator) in place    Idiopathic urticaria    Internal hemorrhoids    Low back pain  syndrome    Melanoma in situ of skin of trunk (HCC)    chest   PVC (premature ventricular contraction)    associated with cardiac arrest   Sleep apnea    no c-pap   Tubular adenoma of colon 10/2010    PAST SURGICAL HISTORY: Past Surgical History:  Procedure Laterality Date    implantation x2     CARDIAC DEFIBRILLATOR PLACEMENT  07/01/2010   Explanation of a previously implanted device, pocket revision, and insertion of a new device, and intraoperative defibrillation threshold testing Robyne)   CARDIAC DEFIBRILLATOR REMOVAL  07/05/2002   Defibrillator change Robyne) 3 times changed   CATARACT EXTRACTION W/PHACO Left 02/04/2021   Procedure: CATARACT EXTRACTION PHACO AND INTRAOCULAR LENS PLACEMENT (IOC) LEFT VIVITY toric LENS 9.21 01:11.6;  Surgeon: Mittie Gaskin, MD;  Location[facility];  Service: Ophthalmology;  Laterality: Left;  keep patient at 11:30 arrival   CATARACT EXTRACTION W/PHACO Right 03/04/2021   Procedure: CATARACT EXTRACTION PHACO AND INTRAOCULAR LENS PLACEMENT (IOC) RIGHT VIVITY LENS 8.59 01:15.3;  Surgeon: Mittie Gaskin, MD;  Location: [facility];  Service: Ophthalmology;  Laterality: Right;   COLONOSCOPY     HEMORRHOID SURGERY     ICD     TONSILLECTOMY      FAMILY HISTORY: Family History  Problem Relation Age of Onset   Heart disease Mother    Hypertension Mother    Heart disease Father    Diverticulosis Father    Coronary artery disease Father    Alopecia Father    Prostate cancer Brother    Diabetes Brother    Heart disease  Brother        heart transplant   Lung cancer Paternal Grandfather        Smoker   Cancer Paternal Grandfather        lung cancer - smoker   Colon cancer Neg Hx    Esophageal cancer Neg Hx    Pancreatic cancer Neg Hx    Stomach cancer Neg Hx     SOCIAL HISTORY: Social History   Socioeconomic History   Marital status: Married    Spouse name: Nathanel   Number of children: 2   Years of  education: College   Highest education level: Not on file  Occupational History    Employer: DISABLED  Tobacco Use   Smoking status: Former    Current packs/day: 0.00    Average packs/day: 1 pack/day for 10.0 years (10.0 ttl pk-yrs)    Types: Cigarettes    Start date: 07/05/1985    Quit date: 07/06/1995    Years since quitting: 28.5   Smokeless tobacco: Former    Types: Chew    Quit date: 11/04/1994  Vaping Use   Vaping status: Never Used  Substance and Sexual Activity   Alcohol use: No    Alcohol/week: 0.0 standard drinks of alcohol    Comment: socially   Drug use: No   Sexual activity: Not on file  Other Topics Concern   Not on file  Social History Narrative   Patient is married Bobbetta) 1974 with 2 daughters   5 grandchildren   1 great granddaughter.     Daily caffeine use - one cup of coffee and one-two cokes per day   Patient is right-handed.   Patient has a college education   Social Drivers of Corporate investment banker Strain: Low Risk  (10/11/2023)   Overall Financial Resource Strain (CARDIA)    Difficulty of Paying Living Expenses: Not hard at all  Food Insecurity: No Food Insecurity (10/11/2023)   Hunger Vital Sign    Worried About Running Out of Food in the Last Year: Never true    Ran Out of Food in the Last Year: Never true  Transportation Needs: No Transportation Needs (10/11/2023)   PRAPARE - Administrator, Civil Service (Medical): No    Lack of Transportation (Non-Medical): No  Physical Activity: Inactive (10/11/2023)   Exercise Vital Sign    Days of Exercise per Week: 0 days    Minutes of Exercise per Session: 0 min  Stress: No Stress Concern Present (10/11/2023)   [employer] of Occupational Health - Occupational Stress Questionnaire    Feeling of Stress : Not at all  Social Connections: Socially Integrated (10/11/2023)   Social Connection and Isolation Panel    Frequency of Communication with Friends and Family: More than three times a week     Frequency of Social Gatherings with Friends and Family: More than three times a week    Attends Religious Services: More than 4 times per year    Active Member of [employer] or Organizations: Yes    Attends Banker Meetings: More than 4 times per year    Marital Status: Married  Catering manager Violence: Not At Risk (10/11/2023)   Humiliation, Afraid, Rape, and Kick questionnaire    Fear of Current or Ex-Partner: No    Emotionally Abused: No    Physically Abused: No    Sexually Abused: No     Modena Callander, M.D. Ph.D.  [facility] [address] [place],  [place] [address] Phone: [phone] Fax:      [phone]

## 2024-01-23 NOTE — Progress Notes (Signed)
 Botox - 100 units x 1 vials Lot: [id number]  Expiration: 2027/10 NDC: 9976-8854-98  Bacteriostatic 0.9% Sodium Chloride - 2 mL  Lot: [id number] Expiration: 05-04-25 NDC: 9590803397  Dx: G24.3   B/B Witnessed by DELENA MOLT RN

## 2024-01-24 ENCOUNTER — Ambulatory Visit

## 2024-01-26 ENCOUNTER — Ambulatory Visit

## 2024-01-26 DIAGNOSIS — M5442 Lumbago with sciatica, left side: Secondary | ICD-10-CM | POA: Diagnosis not present

## 2024-01-26 DIAGNOSIS — R296 Repeated falls: Secondary | ICD-10-CM

## 2024-01-26 DIAGNOSIS — R2689 Other abnormalities of gait and mobility: Secondary | ICD-10-CM | POA: Diagnosis not present

## 2024-01-26 DIAGNOSIS — R2681 Unsteadiness on feet: Secondary | ICD-10-CM | POA: Diagnosis not present

## 2024-01-26 DIAGNOSIS — R278 Other lack of coordination: Secondary | ICD-10-CM | POA: Diagnosis not present

## 2024-01-26 DIAGNOSIS — R269 Unspecified abnormalities of gait and mobility: Secondary | ICD-10-CM

## 2024-01-26 DIAGNOSIS — R262 Difficulty in walking, not elsewhere classified: Secondary | ICD-10-CM

## 2024-01-26 DIAGNOSIS — M6281 Muscle weakness (generalized): Secondary | ICD-10-CM | POA: Diagnosis not present

## 2024-01-26 DIAGNOSIS — G8929 Other chronic pain: Secondary | ICD-10-CM | POA: Diagnosis not present

## 2024-01-26 NOTE — Therapy (Signed)
 OUTPATIENT PHYSICAL THERAPY NEURO AND LOW BACK TREATMENT   Patient Name: Larry [name] MRN: [id number] DOB:Aug 03, 1950, 73 y.o., male Today's Date: 01/26/2024   PCP: Dr. Arlyss Solian REFERRING PROVIDER: Dr. Arlyss Solian  END OF SESSION:  PT End of Session - 01/26/24 0933     Visit Number 7    Number of Visits 24    Date for PT Re-Evaluation 03/15/24    Progress Note Due on Visit 10    PT Start Time 0933    Equipment Utilized During Treatment Gait belt    Activity Tolerance Patient tolerated treatment well    Behavior During Therapy [facility] for tasks assessed/performed             Past Medical History:  Diagnosis Date   ADD (attention deficit disorder)    Allergy    seasonal flares of perinneal allergies   Cancer (HCC)    melanoma   Cardiac arrest (HCC) 07/1997   aborted   Dementia (HCC)    post resusitative   Depression    Diverticulosis    Family hx of prostate cancer    GERD (gastroesophageal reflux disease)    Headache(784.0)    Hyperlipidemia    Hypothyroidism    ICD (implantable cardiac defibrillator) in place    Idiopathic urticaria    Internal hemorrhoids    Low back pain syndrome    Melanoma in situ of skin of trunk (HCC)    chest   PVC (premature ventricular contraction)    associated with cardiac arrest   Sleep apnea    no c-pap   Tubular adenoma of colon 10/2010   Past Surgical History:  Procedure Laterality Date    implantation x2     CARDIAC DEFIBRILLATOR PLACEMENT  07/01/2010   Explanation of a previously implanted device, pocket revision, and insertion of a new device, and intraoperative defibrillation threshold testing Robyne)   CARDIAC DEFIBRILLATOR REMOVAL  07/05/2002   Defibrillator change Robyne) 3 times changed   CATARACT EXTRACTION W/PHACO Left 02/04/2021   Procedure: CATARACT EXTRACTION PHACO AND INTRAOCULAR LENS PLACEMENT (IOC) LEFT VIVITY toric LENS 9.21 01:11.6;  Surgeon: Mittie Gaskin, MD;  Location: [facility];  Service: Ophthalmology;  Laterality: Left;  keep patient at 11:30 arrival   CATARACT EXTRACTION W/PHACO Right 03/04/2021   Procedure: CATARACT EXTRACTION PHACO AND INTRAOCULAR LENS PLACEMENT (IOC) RIGHT VIVITY LENS 8.59 01:15.3;  Surgeon: Mittie Gaskin, MD;  Location: [facility];  Service: Ophthalmology;  Laterality: Right;   COLONOSCOPY     HEMORRHOID SURGERY     ICD     TONSILLECTOMY     Patient Active Problem List   Diagnosis Date Noted   Back pain 11/13/2023   Cervical dystonia 06/15/2022   Neck pain 07/02/2021   Gait abnormality 12/16/2020   Nonintractable headache 09/30/2020   Tinnitus 09/09/2019   History of sudden cardiac arrest 01/09/2019   Viral syndrome 09/06/2018   Seizures (HCC) 03/02/2018   Syncope 12/26/2017   Other social stressor 12/26/2017   Health care maintenance 08/14/2017   Family hx of prostate cancer    Allergy    Insomnia 10/20/2015   Advance care planning 03/08/2014   Memory loss 09/14/2013   Hypoxic brain injury (HCC) 09/14/2013   Medicare annual wellness visit, subsequent 02/16/2013   Shortness of breath 08/10/2011   Implantable cardioverter-defibrillator (ICD) in situ 02/03/2009   Hypothyroidism 11/18/2008   CONTACT DERMATITIS&OTHER ECZEMA DUE TO PLANTS 02/03/2008   IDIOPATHIC URTICARIA 02/03/2008   Hypertension 02/03/2008  HLD (hyperlipidemia) 11/24/2007   Attention deficit disorder 2007/11/24   PVC/VT non sustained Nov 24, 2007   Sleep apnea Nov 24, 2007   HEADACHE 11/24/07   SUDDEN DEATH-aborted 2007-11-24   LOW BACK PAIN SYNDROME 10/11/2007    ONSET DATE: worse since April 2025  REFERRING DIAG:  Z91.81 (ICD-10-CM) - History of fall  R26.9 (ICD-10-CM) - Gait abnormality  M54.9 (ICD-10-CM) - Back pain, unspecified back location, unspecified back pain laterality, unspecified chronicity    THERAPY DIAG:  Muscle weakness (generalized)  Abnormality of gait and mobility  Difficulty in walking, not elsewhere  classified  Other lack of coordination  Unsteadiness on feet  Repeated falls  Chronic midline low back pain with left-sided sciatica  Rationale for Evaluation and Treatment: Rehabilitation  SUBJECTIVE:                                                                                                                                                                                             SUBJECTIVE STATEMENT: Patient reports doing fairly.  Pt accompanied by: significant other  PERTINENT HISTORY: Patient reports fall in April with subsequent report of low back. PMH includes: Anoxic brain injury in 1999 due to prolonged cardiac arrest, with residual mild cognitive impairment- Slow worsening over the years. He was most recently seen here in outpatient PT clinic for chronic neck pain with good results - now here with order for abnormal gait and low back pain.   PAIN:  Are you having pain? Yes: NPRS scale: current=2 /10; worst 8/10; best= 0/10 Pain location: Low back; Neck pain Pain description: Sharp with movement Aggravating factors: Transfers, bending, prolonged sitting/standing, any twisting; lifting Relieving factors: pain meds, rest, heat  PRECAUTIONS: Fall  RED FLAGS: None   WEIGHT BEARING RESTRICTIONS: No  FALLS: Has patient fallen in last 6 months? Yes. Number of falls 3  LIVING ENVIRONMENT: Lives with: lives with their spouse Lives in: House/apartment Stairs: No Has following equipment at home: None  PLOF: Independent  PATIENT GOALS: Patient reports wanting to feel better and have less pain and not fall  OBJECTIVE:  Note: Objective measures were completed at Evaluation unless otherwise noted.  DIAGNOSTIC FINDINGS: None recent  COGNITION: Overall cognitive status: History of cognitive impairments - at baseline   SENSATION: Light touch: WFL  COORDINATION: Delay reaction time with heel to shin and several other tasks during balance testing today.   EDEMA:   None observed  POSTURE: rounded shoulders, forward head, and stiffness c-spine  LOWER EXTREMITY ROM:     Active  Right Eval Left Eval  Hip flexion    Hip extension    Hip abduction  Hip adduction    Hip internal rotation    Hip external rotation    Knee flexion    Knee extension    Ankle dorsiflexion    Ankle plantarflexion    Ankle inversion    Ankle eversion     (Blank rows = not tested)  LOWER EXTREMITY MMT:    MMT Right Eval Left Eval  Hip flexion 4 4-  Hip extension    Hip abduction 4 4-  Hip adduction    Hip internal rotation 4 4-  Hip external rotation 4 4-  Knee flexion 4 4-  Knee extension 4 4-  Ankle dorsiflexion 4 4  Ankle plantarflexion 4 4  Ankle inversion 4 4  Ankle eversion 4 4  (Blank rows = not tested)  BED MOBILITY:  Findings: Sit to supine Complete Independence Supine to sit Complete Independence Rolling to Right Complete Independence Rolling to Left Complete Independence  TRANSFERS: Sit to stand: SBA  Assistive device utilized: None     Stand to sit: SBA  Assistive device utilized: None      RAMP:  Not tested  CURB:  Not tested  STAIRS: Findings: Number of Stairs: 4, Height of Stairs: 6   , and Comments: use of railings GAIT: Findings: Gait Characteristics: decreased arm swing- Right, decreased arm swing- Left, decreased step length- Right, decreased step length- Left, decreased stance time- Right, decreased stance time- Left, decreased stride length, and shuffling, Distance walked: <100 feet, Assistive device utilized:None, Level of assistance: SBA, and Comments: shuffling- unsteady- stiff with minimal UE sway and stiff neck/posture  FUNCTIONAL TESTS:   5 times sit to stand: 23.62 sec without UE Support *pain LBP Timed up and go (TUG): 23.12 and 23.56 sec without AD= 23.34 sec avg 6 minute walk test: to be assessed next 1-2 visits 10 meter walk test: 12.36 sec= 0.81 m/s Berg Balance Scale:  Item Test date: 12/22/2023 Test  date:  Test date:   Sitting to standing 4. able to stand without using hands and stabilize independently Insert OPRCBERGREEVAL SmartPhrase at re-test date Insert OPRCBERGREEVAL SmartPhrase at re-test date  2. Standing unsupported 4. able to stand safely for 2 minutes    3. Sitting with back unsupported, feet supported 4. able to sit safely and securely for 2 minutes    4. Standing to sitting 4. sits safely with minimal use of hands    5. Pivot transfer  4. able to transfer safely with minor use of hands    6. Standing unsupported with eyes closed 3. able to stand 10 seconds with supervision    7. Standing unsupported with feet together 3. able to place feet together independently and stand 1 minute with supervision    8. Reaching forward with outstretched arms while standing 3. can reach forward 12 cm (5 inches)    9. Pick up object from the floor from standing 3. able to pick up slipper but needs supervision    10. Turning to look behind over left and right shoulders while standing 2. turns sideways but only maintains balance    11. Turn 360 degrees 2. able to turn 360 degrees safely but slowly    12. Place alternate foot on step or stool while standing unsupported 4. ble to stand independently and safely and complete 8 steps in 20 seconds    13. Standing unsupported one foot in front 2. able to take small step independently and hold 30 seconds    14. Standing on one leg 1.  tries to lift leg unable to hold 3 seconds but remains standing independently.      Total Score 43/56 Total Score /56 Total Score /56    Functional gait assessment: To be assessed next 1-2 visits  PATIENT SURVEYS:  ABC scale 58.13%    SUBJECTIVE Chief complaint:  Low back  History: chronic but worse after fall earlier this year.  Red flags None of the following: (bowel/bladder changes, saddle paresthesia, personal history of cancer, chills/fever, night sweats, unrelenting pain, first onset of insidious LBP <20 y/o)  Negative  Pain location: low back - central L4-S1 Pain: Present 0/10, Best 0/10, Worst 8/10 Pain quality: aching, dull, and stabbing Radiating pain: No  Numbness/Tingling: No 24 hour pain behavior: Pain with transfers and reaching/bending over Aggravating factors: transfers/reaching/bending Easing factors: rest in recliner, Medication How long can you sit: as long as I want How long can you stand: 10 min before pain stops me How long can you walk: unlimited  History of prior back injury, pain, surgery, or therapy: No Follow-up appointment with MD: No Dominant hand: right Imaging: Yes -X ray Falls in the last 6 months: Yes  Occupational demands: Not currently working  Hobbies: gardening; playing cards Goals: To be as painfree   OBJECTIVE     Gait Wide based gait- no device- stiff nature with decreased UE swing  Posture Lumbar lordosis: WNL Iliac crest height: equal bilaterally Lumbar lateral shift: negative   AROM (degrees) R/L (all movements include overpressure unless otherwise stated) Lumbar forward flexion: 25* Lumbar extension: 10* Lumbar lateral flexion: R: fingers to 2 above knee jt line L:  fingers approx 3 above lateral knee jt. Line*  *Indicates pain     Repeated Movements No centralization or peripheralization of symptoms with repeated lumbar extension or flexion.      Sensation intact to light touch bilateral LEs as determined by testing dermatomes L2-S2.    R Palpation Graded on 0-4 scale (0 = no pain, 1 = pain, 2 = pain with wincing/grimacing/flinching, 3 = pain with withdrawal, 4 = unwilling to allow palpation) Location LEFT  RIGHT           Lumbar paraspinals 2 2  Quadratus Lumborum 0 0  Gluteus Maximus 0 0  Gluteus Medius 0 0  Deep hip external rotators 0 0  PSIS 0 0  Fortin's Area (SIJ) 0 0  Greater Trochanter 0 0     Muscle Length Hamstrings: R: 33 degrees L: 22 degrees      Passive Accessory Intervertebral Motion  (PAIVM) Pt reports some reproduction of back pain with CPA L1-L5 and UPA bilaterally L1-L5. Generally hypomobile throughout    SPECIAL TESTS Lumbar Radiculopathy and Discogenic:  Slump (SN 83, -LR 0.32): R: Negative L: Negative SLR (SN 92, -LR 0.29): R: Negative L:  Negative      SIJ:  Thigh Thrust (SN 88, -LR 0.18) : R: Negative L: Negative    Functional Tasks Lifting: limited due to pain Deep squat: not tested Sit to stand: slow and guarded  6 Min Walk Test:  Instructed patient to ambulate as quickly and as safely as possible for 6 minutes using LRAD. Patient was allowed to take standing rest breaks without stopping the test, but if the patient required a sitting rest break the clock would be stopped and the test would be over.  Results: 950 feet (290 meters, Avg speed .81 m/s) using no AD with CGA. Results indicate that the patient has reduced endurance with ambulation compared  to age matched norms.  Age Matched Norms: 71-69 yo M: 89 F: 59, 65-79 yo M: 36 F: 471, 41-89 yo M: 417 F: 392 MDC: 58.21 meters (190.98 feet) or 50 meters (ANPTA Core Set of Outcome Measures for Adults with Neurologic Conditions, 2018)                                                                                                                              TREATMENT DATE: 01/26/2024    THEREX:   -Seated Piriformis stretch- hold 30 sec x 3 each LE -Seated fig 4 stretch- hold 30 sec x 3 ea LE -Seated lumbar flex with ball roll x 20   NMR: at support bar; gait belt; CGA -Side step over 1/2 foam roll x 20 alt direction - without UE support  -Fwd/bwd step over 1/2 foam roll x 20 reps  -Dynamic high knee march on airex pad w/o UE support x 20 reps  -Varying foot position on airex pad while arranging alphabet (at dry erase board) - feet wide, feet narrowed, and feet staggered with 1 stand rest break.   TA:  Sit to stand without UE Support and VC for TrA bracing- x 12 reps.    SABRA    PATIENT  EDUCATION:    Education details: Exercise technique for HEP  Person educated: Patient and Spouse Education method: Explanation, Demonstration, Tactile cues, and Verbal cues Education comprehension: verbalized understanding, returned demonstration, verbal cues required, tactile cues required, and needs further education  HOME EXERCISE PROGRAM: Access Code: [email or web address] URL: [email or web address] Date: 01/19/2024 Prepared by: Reyes London  Exercises - Seated Hamstring Stretch  - 1 x daily - 3 sets - 30 sec hold - Seated Piriformis Stretch with Trunk Bend  - 1 x daily - 3 sets - 30 sec  hold - Seated Piriformis Stretch  - 1 x daily - 3 sets - 30 hold - Seated Lumbar Flexion Stretch  - 1 x daily - 3 sets - 30 hold - Sidelying Thoracic Lumbar Rotation  - 1 x daily - 3 sets - 30 hold - Supine Bridge  - 3 x weekly - 3 sets - 10 reps - Sit to Stand with Arms Crossed  - 3 x weekly - 3 sets - 10 reps       Access Code: [email or web address] URL: [email or web address] Date: 12/29/2023 Prepared by: Reyes London  Exercises - Supine Single Knee to Chest Stretch  - 1 x daily - 3 sets - 30 hold - Supine Lower Trunk Rotation  - 1 x daily - 3 sets - 30 sec  hold - Seated Hamstring Stretch  - 1 x daily - 3 sets - 30 hold - Supine Piriformis Stretch with Leg Straight  - 1 x daily - 3 sets - 30 hold - Seated 3 Way Exercise Ball Roll Out Stretch  - 1 x daily - 3 sets - 10 reps - Supine Bridge  - 3 x weekly -  3 sets - 10 reps    12/27/2023:Added seated hamstring, Lower trunk rotation, and knee to chest- will update and issue HEP next session. EVAL: Added Tandem standing and SLS- will need to issue handout next visit along with any Low back stretches  GOALS: Goals reviewed with patient? Yes  SHORT TERM GOALS: Target date: 02/02/2024  Pt will be independent with HEP in order to improve strength and balance in order to decrease fall risk and improve function at  home. Baseline: EVAL- No formal HEP for balance Goal status: INITIAL  LONG TERM GOALS: Target date: 03/15/2024  1.  Patient will complete five times sit to stand test in < 15 seconds indicating an increased LE strength and improved balance. Baseline: EVAL= 23.62 sec Goal status: INITIAL  2.  Patient will improve ABC score to >70%  to demonstrate statistically significant improvement in mobility and quality of life as it relates to their confidence in balance.  Baseline: EVAL= 58.13% Goal status: INITIAL   3.  Patient will increase Berg Balance score by > 6 points to demonstrate decreased fall risk during functional activities. Baseline: EVAL= 43/56 Goal status: INITIAL   4.   Patient will reduce timed up and go to <11 seconds to reduce fall risk and demonstrate improved transfer/gait ability. Baseline: EVAL= 23.34 sec without AD Goal status: INITIAL  5.   Patient will increase 10 meter walk test to >1.14m/s as to improve gait speed for better community ambulation and to reduce fall risk. Baseline: EVAL=0.81 m/s without AD Goal status: INITIAL  6.   Patient will increase six minute walk test distance to >1000 for progression to community ambulator and improve gait ability Baseline: EVAL= To be assessed next visit; 12/27/2023= 950 feet without AD Goal status: INITIAL  7. Pt will decrease mODI score by at least 13 points in order demonstrate clinically significant reduction in back pain/disability.   Baseline: will issue next visit; 12/29/2023= 42%  Goal status: NEW  8. Pt will decrease worst back pain as reported on NPRS by at least 2 points in order to demonstrate clinically significant reduction in back pain.   Baseline: 12/27/2023= 8/10 worst low back pain  Goal status: NEW 9. Patient will demonstrate 50% improved lumbar AROM for improved transfers/bed mobility, and standing posture.   Baseline: 12/27/2023- very restricted ROM (see table above)   Goal status: NEW  Pt will  improve FGA by at least 3 points in order to demonstrate clinically significant improvement in balance and decreased risk for falls.  Baseline: 01/12/2024= 15  Goal Status: NEW   ASSESSMENT:  CLINICAL IMPRESSION: Patient is a 73 y.o. male who was seen today for physical therapy treatment for chronic back pain. Treatment shifted focus to balance after a few lumbar stretches. He was very unsteady initially with some single Leg activities but able to demonstrate progress overall. He was able to use some ankle righting reaction but more hip and some LOB with more narrowed feet position and some difficulty with dual task activities involving balancing while placing letters in alphabetical order.  No complaint of any worsening back pain throughout session today.  Patient will benefit from skilled PT services to address his low back symptoms, improve balance, and decrease risk for future falls.   OBJECTIVE IMPAIRMENTS: Abnormal gait, decreased activity tolerance, decreased balance, decreased cognition, decreased coordination, decreased endurance, decreased mobility, difficulty walking, decreased strength, and pain.   ACTIVITY LIMITATIONS: carrying, lifting, bending, sitting, standing, squatting, stairs, and transfers  PARTICIPATION LIMITATIONS: meal  prep, cleaning, laundry, shopping, community activity, and yard work  PERSONAL FACTORS: 1-2 comorbidities: anoxic brain injury; depression are also affecting patient's functional outcome.   REHAB POTENTIAL: Good  CLINICAL DECISION MAKING: Evolving/moderate complexity  EVALUATION COMPLEXITY: Moderate  PLAN:  PT FREQUENCY: 1-2x/week  PT DURATION: 12 weeks  PLANNED INTERVENTIONS: 97164- PT Re-evaluation, 97750- Physical Performance Testing, 97110-Therapeutic exercises, 97530- Therapeutic activity, [id number]- Neuromuscular re-education, 97535- Self Care, [id number]- Manual therapy, [phone]- Gait training, [phone]- Orthotic Initial, [phone]- Orthotic/Prosthetic  subsequent, [phone]- Canalith repositioning, Patient/Family education, Balance training, Stair training, Taping, Joint mobilization, Joint manipulation, Spinal manipulation, Spinal mobilization, Compression bandaging, Vestibular training, DME instructions, Cryotherapy, and Moist heat  PLAN FOR NEXT SESSION:  Review and progress HEP Continue to progress balance  Continue to progress core/low back strengthening    Reyes LOISE London, PT 01/26/2024, 9:35 AM

## 2024-01-30 ENCOUNTER — Ambulatory Visit

## 2024-01-31 ENCOUNTER — Ambulatory Visit

## 2024-02-02 ENCOUNTER — Ambulatory Visit

## 2024-02-02 DIAGNOSIS — M5442 Lumbago with sciatica, left side: Secondary | ICD-10-CM | POA: Diagnosis not present

## 2024-02-02 DIAGNOSIS — G8929 Other chronic pain: Secondary | ICD-10-CM

## 2024-02-02 DIAGNOSIS — M6281 Muscle weakness (generalized): Secondary | ICD-10-CM | POA: Diagnosis not present

## 2024-02-02 DIAGNOSIS — R2689 Other abnormalities of gait and mobility: Secondary | ICD-10-CM | POA: Diagnosis not present

## 2024-02-02 DIAGNOSIS — R2681 Unsteadiness on feet: Secondary | ICD-10-CM | POA: Diagnosis not present

## 2024-02-02 DIAGNOSIS — R269 Unspecified abnormalities of gait and mobility: Secondary | ICD-10-CM | POA: Diagnosis not present

## 2024-02-02 DIAGNOSIS — R262 Difficulty in walking, not elsewhere classified: Secondary | ICD-10-CM

## 2024-02-02 DIAGNOSIS — R296 Repeated falls: Secondary | ICD-10-CM | POA: Diagnosis not present

## 2024-02-02 DIAGNOSIS — R278 Other lack of coordination: Secondary | ICD-10-CM

## 2024-02-02 NOTE — Therapy (Signed)
 OUTPATIENT PHYSICAL THERAPY NEURO AND LOW BACK TREATMENT   Patient Name: Larry [name] MRN: [id number] DOB:1951/01/25, 73 y.o., male Today's Date: 02/02/2024   PCP: Dr. Arlyss Solian REFERRING PROVIDER: Dr. Arlyss Solian  END OF SESSION:  PT End of Session - 02/02/24 0945     Visit Number 8    Number of Visits 24    Date for PT Re-Evaluation 03/15/24    Progress Note Due on Visit 10    PT Start Time 0932    PT Stop Time 1013    PT Time Calculation (min) 41 min    Equipment Utilized During Treatment Gait belt    Activity Tolerance Patient tolerated treatment well    Behavior During Therapy WFL for tasks assessed/performed              Past Medical History:  Diagnosis Date   ADD (attention deficit disorder)    Allergy    seasonal flares of perinneal allergies   Cancer (HCC)    melanoma   Cardiac arrest (HCC) 07/1997   aborted   Dementia (HCC)    post resusitative   Depression    Diverticulosis    Family hx of prostate cancer    GERD (gastroesophageal reflux disease)    Headache(784.0)    Hyperlipidemia    Hypothyroidism    ICD (implantable cardiac defibrillator) in place    Idiopathic urticaria    Internal hemorrhoids    Low back pain syndrome    Melanoma in situ of skin of trunk (HCC)    chest   PVC (premature ventricular contraction)    associated with cardiac arrest   Sleep apnea    no c-pap   Tubular adenoma of colon 10/2010   Past Surgical History:  Procedure Laterality Date    implantation x2     CARDIAC DEFIBRILLATOR PLACEMENT  07/01/2010   Explanation of a previously implanted device, pocket revision, and insertion of a new device, and intraoperative defibrillation threshold testing Robyne)   CARDIAC DEFIBRILLATOR REMOVAL  07/05/2002   Defibrillator change Robyne) 3 times changed   CATARACT EXTRACTION W/PHACO Left 02/04/2021   Procedure: CATARACT EXTRACTION PHACO AND INTRAOCULAR LENS PLACEMENT (IOC) LEFT VIVITY toric LENS 9.21 01:11.6;   Surgeon: Mittie Gaskin, MD;  Location: [facility];  Service: Ophthalmology;  Laterality: Left;  keep patient at 11:30 arrival   CATARACT EXTRACTION W/PHACO Right 03/04/2021   Procedure: CATARACT EXTRACTION PHACO AND INTRAOCULAR LENS PLACEMENT (IOC) RIGHT VIVITY LENS 8.59 01:15.3;  Surgeon: Mittie Gaskin, MD;  Location: [facility];  Service: Ophthalmology;  Laterality: Right;   COLONOSCOPY     HEMORRHOID SURGERY     ICD     TONSILLECTOMY     Patient Active Problem List   Diagnosis Date Noted   Back pain 11/13/2023   Cervical dystonia 06/15/2022   Neck pain 07/02/2021   Gait abnormality 12/16/2020   Nonintractable headache 09/30/2020   Tinnitus 09/09/2019   History of sudden cardiac arrest 01/09/2019   Viral syndrome 09/06/2018   Seizures (HCC) 03/02/2018   Syncope 12/26/2017   Other social stressor 12/26/2017   Health care maintenance 08/14/2017   Family hx of prostate cancer    Allergy    Insomnia 10/20/2015   Advance care planning 03/08/2014   Memory loss 09/14/2013   Hypoxic brain injury (HCC) 09/14/2013   Medicare annual wellness visit, subsequent 02/16/2013   Shortness of breath 08/10/2011   Implantable cardioverter-defibrillator (ICD) in situ 02/03/2009   Hypothyroidism 11/18/2008   CONTACT  DERMATITIS&OTHER ECZEMA DUE TO PLANTS 02/03/2008   IDIOPATHIC URTICARIA 02/03/2008   Hypertension 02/03/2008   HLD (hyperlipidemia) 04-Nov-2007   Attention deficit disorder 11-04-2007   PVC/VT non sustained 11-04-07   Sleep apnea 04-Nov-2007   HEADACHE 11/04/07   SUDDEN DEATH-aborted 11-04-07   LOW BACK PAIN SYNDROME 10/11/2007    ONSET DATE: worse since April 2025  REFERRING DIAG:  Z91.81 (ICD-10-CM) - History of fall  R26.9 (ICD-10-CM) - Gait abnormality  M54.9 (ICD-10-CM) - Back pain, unspecified back location, unspecified back pain laterality, unspecified chronicity    THERAPY DIAG:  Muscle weakness (generalized)  Abnormality of  gait and mobility  Difficulty in walking, not elsewhere classified  Other lack of coordination  Unsteadiness on feet  Repeated falls  Chronic midline low back pain with left-sided sciatica  Rationale for Evaluation and Treatment: Rehabilitation  SUBJECTIVE:                                                                                                                                                                                             SUBJECTIVE STATEMENT: Patient reports feeling pretty good today with no report of falls.   Pt accompanied by: significant other  PERTINENT HISTORY: Patient reports fall in April with subsequent report of low back. PMH includes: Anoxic brain injury in 1999 due to prolonged cardiac arrest, with residual mild cognitive impairment- Slow worsening over the years. He was most recently seen here in outpatient PT clinic for chronic neck pain with good results - now here with order for abnormal gait and low back pain.   PAIN:  Are you having pain? Yes: NPRS scale: current=2 /10; worst 8/10; best= 0/10 Pain location: Low back; Neck pain Pain description: Sharp with movement Aggravating factors: Transfers, bending, prolonged sitting/standing, any twisting; lifting Relieving factors: pain meds, rest, heat  PRECAUTIONS: Fall  RED FLAGS: None   WEIGHT BEARING RESTRICTIONS: No  FALLS: Has patient fallen in last 6 months? Yes. Number of falls 3  LIVING ENVIRONMENT: Lives with: lives with their spouse Lives in: House/apartment Stairs: No Has following equipment at home: None  PLOF: Independent  PATIENT GOALS: Patient reports wanting to feel better and have less pain and not fall  OBJECTIVE:  Note: Objective measures were completed at Evaluation unless otherwise noted.  DIAGNOSTIC FINDINGS: None recent  COGNITION: Overall cognitive status: History of cognitive impairments - at baseline   SENSATION: Light touch: WFL  COORDINATION: Delay  reaction time with heel to shin and several other tasks during balance testing today.   EDEMA:  None observed  POSTURE: rounded shoulders, forward head, and stiffness c-spine  LOWER EXTREMITY  ROM:     Active  Right Eval Left Eval  Hip flexion    Hip extension    Hip abduction    Hip adduction    Hip internal rotation    Hip external rotation    Knee flexion    Knee extension    Ankle dorsiflexion    Ankle plantarflexion    Ankle inversion    Ankle eversion     (Blank rows = not tested)  LOWER EXTREMITY MMT:    MMT Right Eval Left Eval  Hip flexion 4 4-  Hip extension    Hip abduction 4 4-  Hip adduction    Hip internal rotation 4 4-  Hip external rotation 4 4-  Knee flexion 4 4-  Knee extension 4 4-  Ankle dorsiflexion 4 4  Ankle plantarflexion 4 4  Ankle inversion 4 4  Ankle eversion 4 4  (Blank rows = not tested)  BED MOBILITY:  Findings: Sit to supine Complete Independence Supine to sit Complete Independence Rolling to Right Complete Independence Rolling to Left Complete Independence  TRANSFERS: Sit to stand: SBA  Assistive device utilized: None     Stand to sit: SBA  Assistive device utilized: None      RAMP:  Not tested  CURB:  Not tested  STAIRS: Findings: Number of Stairs: 4, Height of Stairs: 6   , and Comments: use of railings GAIT: Findings: Gait Characteristics: decreased arm swing- Right, decreased arm swing- Left, decreased step length- Right, decreased step length- Left, decreased stance time- Right, decreased stance time- Left, decreased stride length, and shuffling, Distance walked: <100 feet, Assistive device utilized:None, Level of assistance: SBA, and Comments: shuffling- unsteady- stiff with minimal UE sway and stiff neck/posture  FUNCTIONAL TESTS:   5 times sit to stand: 23.62 sec without UE Support *pain LBP Timed up and go (TUG): 23.12 and 23.56 sec without AD= 23.34 sec avg 6 minute walk test: to be assessed next 1-2  visits 10 meter walk test: 12.36 sec= 0.81 m/s Berg Balance Scale:  Item Test date: 12/22/2023 Test date:  Test date:   Sitting to standing 4. able to stand without using hands and stabilize independently Insert OPRCBERGREEVAL SmartPhrase at re-test date Insert OPRCBERGREEVAL SmartPhrase at re-test date  2. Standing unsupported 4. able to stand safely for 2 minutes    3. Sitting with back unsupported, feet supported 4. able to sit safely and securely for 2 minutes    4. Standing to sitting 4. sits safely with minimal use of hands    5. Pivot transfer  4. able to transfer safely with minor use of hands    6. Standing unsupported with eyes closed 3. able to stand 10 seconds with supervision    7. Standing unsupported with feet together 3. able to place feet together independently and stand 1 minute with supervision    8. Reaching forward with outstretched arms while standing 3. can reach forward 12 cm (5 inches)    9. Pick up object from the floor from standing 3. able to pick up slipper but needs supervision    10. Turning to look behind over left and right shoulders while standing 2. turns sideways but only maintains balance    11. Turn 360 degrees 2. able to turn 360 degrees safely but slowly    12. Place alternate foot on step or stool while standing unsupported 4. ble to stand independently and safely and complete 8 steps in 20 seconds  13. Standing unsupported one foot in front 2. able to take small step independently and hold 30 seconds    14. Standing on one leg 1. tries to lift leg unable to hold 3 seconds but remains standing independently.      Total Score 43/56 Total Score /56 Total Score /56    Functional gait assessment: To be assessed next 1-2 visits  PATIENT SURVEYS:  ABC scale 58.13%    SUBJECTIVE Chief complaint:  Low back  History: chronic but worse after fall earlier this year.  Red flags None of the following: (bowel/bladder changes, saddle paresthesia, personal  history of cancer, chills/fever, night sweats, unrelenting pain, first onset of insidious LBP <20 y/o) Negative  Pain location: low back - central L4-S1 Pain: Present 0/10, Best 0/10, Worst 8/10 Pain quality: aching, dull, and stabbing Radiating pain: No  Numbness/Tingling: No 24 hour pain behavior: Pain with transfers and reaching/bending over Aggravating factors: transfers/reaching/bending Easing factors: rest in recliner, Medication How long can you sit: as long as I want How long can you stand: 10 min before pain stops me How long can you walk: unlimited  History of prior back injury, pain, surgery, or therapy: No Follow-up appointment with MD: No Dominant hand: right Imaging: Yes -X ray Falls in the last 6 months: Yes  Occupational demands: Not currently working  Hobbies: gardening; playing cards Goals: To be as painfree   OBJECTIVE     Gait Wide based gait- no device- stiff nature with decreased UE swing  Posture Lumbar lordosis: WNL Iliac crest height: equal bilaterally Lumbar lateral shift: negative   AROM (degrees) R/L (all movements include overpressure unless otherwise stated) Lumbar forward flexion: 25* Lumbar extension: 10* Lumbar lateral flexion: R: fingers to 2 above knee jt line L:  fingers approx 3 above lateral knee jt. Line*  *Indicates pain     Repeated Movements No centralization or peripheralization of symptoms with repeated lumbar extension or flexion.      Sensation intact to light touch bilateral LEs as determined by testing dermatomes L2-S2.    R Palpation Graded on 0-4 scale (0 = no pain, 1 = pain, 2 = pain with wincing/grimacing/flinching, 3 = pain with withdrawal, 4 = unwilling to allow palpation) Location LEFT  RIGHT           Lumbar paraspinals 2 2  Quadratus Lumborum 0 0  Gluteus Maximus 0 0  Gluteus Medius 0 0  Deep hip external rotators 0 0  PSIS 0 0  Fortin's Area (SIJ) 0 0  Greater Trochanter 0 0     Muscle  Length Hamstrings: R: 33 degrees L: 22 degrees      Passive Accessory Intervertebral Motion (PAIVM) Pt reports some reproduction of back pain with CPA L1-L5 and UPA bilaterally L1-L5. Generally hypomobile throughout    SPECIAL TESTS Lumbar Radiculopathy and Discogenic:  Slump (SN 83, -LR 0.32): R: Negative L: Negative SLR (SN 92, -LR 0.29): R: Negative L:  Negative      SIJ:  Thigh Thrust (SN 88, -LR 0.18) : R: Negative L: Negative    Functional Tasks Lifting: limited due to pain Deep squat: not tested Sit to stand: slow and guarded  6 Min Walk Test:  Instructed patient to ambulate as quickly and as safely as possible for 6 minutes using LRAD. Patient was allowed to take standing rest breaks without stopping the test, but if the patient required a sitting rest break the clock would be stopped and the test would be  over.  Results: 950 feet (290 meters, Avg speed .81 m/s) using no AD with CGA. Results indicate that the patient has reduced endurance with ambulation compared to age matched norms.  Age Matched Norms: 30-69 yo M: 54 F: 73, 41-79 yo M: 81 F: 471, 7-89 yo M: 417 F: 392 MDC: 58.21 meters (190.98 feet) or 50 meters (ANPTA Core Set of Outcome Measures for Adults with Neurologic Conditions, 2018)                                                                                                                              TREATMENT DATE: 02/02/2024    THEREX:   -Standing hamstring stretch-  hold 30 sec x 3 each LE -Seated fig 4 stretch- hold 30 sec x 3 ea LE -Seated lumbar flex with ball roll x 20   NMR: at support bar; gait belt; CGA  - Step tap onto 1st step without AD x 20  -Step tap (dual task- calling out ACC/SEC teams) x 20   - Side step on/off 6 block- (dual task - calling out animals) x 20 each side.   - Fwd/bwd step up/over 1/2 foam with GTB at ankle x 20 ea  -Fwd/bwd step over 1/2 foam roll x 20 reps each LE no UE support.       SABRA     PATIENT EDUCATION:    Education details: Exercise technique for HEP  Person educated: Patient and Spouse Education method: Explanation, Demonstration, Tactile cues, and Verbal cues Education comprehension: verbalized understanding, returned demonstration, verbal cues required, tactile cues required, and needs further education  HOME EXERCISE PROGRAM: Access Code: [email or web address] URL: [email or web address] Date: 01/19/2024 Prepared by: Reyes London  Exercises - Seated Hamstring Stretch  - 1 x daily - 3 sets - 30 sec hold - Seated Piriformis Stretch with Trunk Bend  - 1 x daily - 3 sets - 30 sec  hold - Seated Piriformis Stretch  - 1 x daily - 3 sets - 30 hold - Seated Lumbar Flexion Stretch  - 1 x daily - 3 sets - 30 hold - Sidelying Thoracic Lumbar Rotation  - 1 x daily - 3 sets - 30 hold - Supine Bridge  - 3 x weekly - 3 sets - 10 reps - Sit to Stand with Arms Crossed  - 3 x weekly - 3 sets - 10 reps       Access Code: [email or web address] URL: [email or web address] Date: 12/29/2023 Prepared by: Reyes London  Exercises - Supine Single Knee to Chest Stretch  - 1 x daily - 3 sets - 30 hold - Supine Lower Trunk Rotation  - 1 x daily - 3 sets - 30 sec  hold - Seated Hamstring Stretch  - 1 x daily - 3 sets - 30 hold - Supine Piriformis Stretch with Leg Straight  - 1 x daily - 3 sets - 30 hold - Seated 3 Way Exercise [employer] Stretch  -  1 x daily - 3 sets - 10 reps - Supine Bridge  - 3 x weekly - 3 sets - 10 reps    12/27/2023:Added seated hamstring, Lower trunk rotation, and knee to chest- will update and issue HEP next session. EVAL: Added Tandem standing and SLS- will need to issue handout next visit along with any Low back stretches  GOALS: Goals reviewed with patient? Yes  SHORT TERM GOALS: Target date: 02/02/2024  Pt will be independent with HEP in order to improve strength and balance in order to decrease fall risk and improve  function at home. Baseline: EVAL- No formal HEP for balance Goal status: INITIAL  LONG TERM GOALS: Target date: 03/15/2024  1.  Patient will complete five times sit to stand test in < 15 seconds indicating an increased LE strength and improved balance. Baseline: EVAL= 23.62 sec Goal status: INITIAL  2.  Patient will improve ABC score to >70%  to demonstrate statistically significant improvement in mobility and quality of life as it relates to their confidence in balance.  Baseline: EVAL= 58.13% Goal status: INITIAL   3.  Patient will increase Berg Balance score by > 6 points to demonstrate decreased fall risk during functional activities. Baseline: EVAL= 43/56 Goal status: INITIAL   4.   Patient will reduce timed up and go to <11 seconds to reduce fall risk and demonstrate improved transfer/gait ability. Baseline: EVAL= 23.34 sec without AD Goal status: INITIAL  5.   Patient will increase 10 meter walk test to >1.58m/s as to improve gait speed for better community ambulation and to reduce fall risk. Baseline: EVAL=0.81 m/s without AD Goal status: INITIAL  6.   Patient will increase six minute walk test distance to >1000 for progression to community ambulator and improve gait ability Baseline: EVAL= To be assessed next visit; 12/27/2023= 950 feet without AD Goal status: INITIAL  7. Pt will decrease mODI score by at least 13 points in order demonstrate clinically significant reduction in back pain/disability.   Baseline: will issue next visit; 12/29/2023= 42%  Goal status: NEW  8. Pt will decrease worst back pain as reported on NPRS by at least 2 points in order to demonstrate clinically significant reduction in back pain.   Baseline: 12/27/2023= 8/10 worst low back pain  Goal status: NEW 9. Patient will demonstrate 50% improved lumbar AROM for improved transfers/bed mobility, and standing posture.   Baseline: 12/27/2023- very restricted ROM (see table above)   Goal status:  NEW  Pt will improve FGA by at least 3 points in order to demonstrate clinically significant improvement in balance and decreased risk for falls.  Baseline: 01/12/2024= 15  Goal Status: NEW   ASSESSMENT:  CLINICAL IMPRESSION: Patient is a 73 y.o. male who was seen today for physical therapy treatment for chronic back pain. Treatment consisted of focusing on dynamic balance and Low back ROM. He continues to benefit from gentle stretching with min VC for technique. Continued to emphasize importance of Stretching daily for max gains. He performed well with dual task activities today with only mild delay with coordination with adding additional cog dual task.  No complaint of any worsening back pain throughout session today.  Patient will benefit from skilled PT services to address his low back symptoms, improve balance, and decrease risk for future falls.   OBJECTIVE IMPAIRMENTS: Abnormal gait, decreased activity tolerance, decreased balance, decreased cognition, decreased coordination, decreased endurance, decreased mobility, difficulty walking, decreased strength, and pain.   ACTIVITY LIMITATIONS: carrying, lifting, bending,  sitting, standing, squatting, stairs, and transfers  PARTICIPATION LIMITATIONS: meal prep, cleaning, laundry, shopping, community activity, and yard work  PERSONAL FACTORS: 1-2 comorbidities: anoxic brain injury; depression are also affecting patient's functional outcome.   REHAB POTENTIAL: Good  CLINICAL DECISION MAKING: Evolving/moderate complexity  EVALUATION COMPLEXITY: Moderate  PLAN:  PT FREQUENCY: 1-2x/week  PT DURATION: 12 weeks  PLANNED INTERVENTIONS: [id number]- PT Re-evaluation, 97750- Physical Performance Testing, 97110-Therapeutic exercises, 97530- Therapeutic activity, [id number]- Neuromuscular re-education, 97535- Self Care, [id number]- Manual therapy, [phone]- Gait training, [phone]- Orthotic Initial, [phone]- Orthotic/Prosthetic subsequent, [phone]- Canalith repositioning,  Patient/Family education, Balance training, Stair training, Taping, Joint mobilization, Joint manipulation, Spinal manipulation, Spinal mobilization, Compression bandaging, Vestibular training, DME instructions, Cryotherapy, and Moist heat  PLAN FOR NEXT SESSION:  Review and progress HEP Continue to progress balance  Continue to progress core/low back strengthening Cog dual task with dynamic balance    Reyes LOISE London, PT 02/02/2024, 10:17 AM

## 2024-02-06 ENCOUNTER — Ambulatory Visit

## 2024-02-07 ENCOUNTER — Ambulatory Visit: Attending: Neurology

## 2024-02-07 DIAGNOSIS — R278 Other lack of coordination: Secondary | ICD-10-CM | POA: Insufficient documentation

## 2024-02-07 DIAGNOSIS — M5442 Lumbago with sciatica, left side: Secondary | ICD-10-CM | POA: Insufficient documentation

## 2024-02-07 DIAGNOSIS — R269 Unspecified abnormalities of gait and mobility: Secondary | ICD-10-CM | POA: Insufficient documentation

## 2024-02-07 DIAGNOSIS — R2681 Unsteadiness on feet: Secondary | ICD-10-CM | POA: Insufficient documentation

## 2024-02-07 DIAGNOSIS — G8929 Other chronic pain: Secondary | ICD-10-CM | POA: Diagnosis not present

## 2024-02-07 DIAGNOSIS — R296 Repeated falls: Secondary | ICD-10-CM | POA: Diagnosis not present

## 2024-02-07 DIAGNOSIS — M6281 Muscle weakness (generalized): Secondary | ICD-10-CM | POA: Diagnosis not present

## 2024-02-07 DIAGNOSIS — R2689 Other abnormalities of gait and mobility: Secondary | ICD-10-CM | POA: Insufficient documentation

## 2024-02-07 DIAGNOSIS — R262 Difficulty in walking, not elsewhere classified: Secondary | ICD-10-CM | POA: Insufficient documentation

## 2024-02-07 NOTE — Therapy (Unsigned)
 OUTPATIENT PHYSICAL THERAPY NEURO AND LOW BACK TREATMENT   Patient Name: Larry [name] MRN: [id number] DOB:1950-08-16, 73 y.o., male Today's Date: 02/08/2024   PCP: Dr. Arlyss Solian REFERRING PROVIDER: Dr. Arlyss Solian  END OF SESSION:  PT End of Session - 02/07/24 0938     Visit Number 9    Number of Visits 24    Date for PT Re-Evaluation 03/15/24    Progress Note Due on Visit 10    PT Start Time 0935    PT Stop Time 1014    PT Time Calculation (min) 39 min    Equipment Utilized During Treatment Gait belt    Activity Tolerance Patient tolerated treatment well    Behavior During Therapy WFL for tasks assessed/performed              Past Medical History:  Diagnosis Date   ADD (attention deficit disorder)    Allergy    seasonal flares of perinneal allergies   Cancer (HCC)    melanoma   Cardiac arrest (HCC) 07/1997   aborted   Dementia (HCC)    post resusitative   Depression    Diverticulosis    Family hx of prostate cancer    GERD (gastroesophageal reflux disease)    Headache(784.0)    Hyperlipidemia    Hypothyroidism    ICD (implantable cardiac defibrillator) in place    Idiopathic urticaria    Internal hemorrhoids    Low back pain syndrome    Melanoma in situ of skin of trunk (HCC)    chest   PVC (premature ventricular contraction)    associated with cardiac arrest   Sleep apnea    no c-pap   Tubular adenoma of colon 10/2010   Past Surgical History:  Procedure Laterality Date    implantation x2     CARDIAC DEFIBRILLATOR PLACEMENT  07/01/2010   Explanation of a previously implanted device, pocket revision, and insertion of a new device, and intraoperative defibrillation threshold testing Robyne)   CARDIAC DEFIBRILLATOR REMOVAL  07/05/2002   Defibrillator change Robyne) 3 times changed   CATARACT EXTRACTION W/PHACO Left 02/04/2021   Procedure: CATARACT EXTRACTION PHACO AND INTRAOCULAR LENS PLACEMENT (IOC) LEFT VIVITY toric LENS 9.21 01:11.6;   Surgeon: Mittie Gaskin, MD;  Location: [facility];  Service: Ophthalmology;  Laterality: Left;  keep patient at 11:30 arrival   CATARACT EXTRACTION W/PHACO Right 03/04/2021   Procedure: CATARACT EXTRACTION PHACO AND INTRAOCULAR LENS PLACEMENT (IOC) RIGHT VIVITY LENS 8.59 01:15.3;  Surgeon: Mittie Gaskin, MD;  Location: [facility];  Service: Ophthalmology;  Laterality: Right;   COLONOSCOPY     HEMORRHOID SURGERY     ICD     TONSILLECTOMY     Patient Active Problem List   Diagnosis Date Noted   Back pain 11/13/2023   Cervical dystonia 06/15/2022   Neck pain 07/02/2021   Gait abnormality 12/16/2020   Nonintractable headache 09/30/2020   Tinnitus 09/09/2019   History of sudden cardiac arrest 01/09/2019   Viral syndrome 09/06/2018   Seizures (HCC) 03/02/2018   Syncope 12/26/2017   Other social stressor 12/26/2017   Health care maintenance 08/14/2017   Family hx of prostate cancer    Allergy    Insomnia 10/20/2015   Advance care planning 03/08/2014   Memory loss 09/14/2013   Hypoxic brain injury (HCC) 09/14/2013   Medicare annual wellness visit, subsequent 02/16/2013   Shortness of breath 08/10/2011   Implantable cardioverter-defibrillator (ICD) in situ 02/03/2009   Hypothyroidism 11/18/2008   CONTACT  DERMATITIS&OTHER ECZEMA DUE TO PLANTS 02/03/2008   IDIOPATHIC URTICARIA 02/03/2008   Hypertension 02/03/2008   HLD (hyperlipidemia) 10-28-2007   Attention deficit disorder 2007/10/28   PVC/VT non sustained 10-28-2007   Sleep apnea 10-28-2007   HEADACHE 10/28/07   SUDDEN DEATH-aborted Oct 28, 2007   LOW BACK PAIN SYNDROME 10/11/2007    ONSET DATE: worse since April 2025  REFERRING DIAG:  Z91.81 (ICD-10-CM) - History of fall  R26.9 (ICD-10-CM) - Gait abnormality  M54.9 (ICD-10-CM) - Back pain, unspecified back location, unspecified back pain laterality, unspecified chronicity    THERAPY DIAG:  Muscle weakness (generalized)  Abnormality of  gait and mobility  Difficulty in walking, not elsewhere classified  Other lack of coordination  Unsteadiness on feet  Repeated falls  Rationale for Evaluation and Treatment: Rehabilitation  SUBJECTIVE:                                                                                                                                                                                             SUBJECTIVE STATEMENT: Patient reports 3/10 low back pain today.   Pt accompanied by: significant other  PERTINENT HISTORY: Patient reports fall in April with subsequent report of low back. PMH includes: Anoxic brain injury in 1999 due to prolonged cardiac arrest, with residual mild cognitive impairment- Slow worsening over the years. He was most recently seen here in outpatient PT clinic for chronic neck pain with good results - now here with order for abnormal gait and low back pain.   PAIN:  Are you having pain? Yes: NPRS scale: current=2 /10; worst 8/10; best= 0/10 Pain location: Low back; Neck pain Pain description: Sharp with movement Aggravating factors: Transfers, bending, prolonged sitting/standing, any twisting; lifting Relieving factors: pain meds, rest, heat  PRECAUTIONS: Fall  RED FLAGS: None   WEIGHT BEARING RESTRICTIONS: No  FALLS: Has patient fallen in last 6 months? Yes. Number of falls 3  LIVING ENVIRONMENT: Lives with: lives with their spouse Lives in: House/apartment Stairs: No Has following equipment at home: None  PLOF: Independent  PATIENT GOALS: Patient reports wanting to feel better and have less pain and not fall  OBJECTIVE:  Note: Objective measures were completed at Evaluation unless otherwise noted.  DIAGNOSTIC FINDINGS: None recent  COGNITION: Overall cognitive status: History of cognitive impairments - at baseline   SENSATION: Light touch: WFL  COORDINATION: Delay reaction time with heel to shin and several other tasks during balance testing  today.   EDEMA:  None observed  POSTURE: rounded shoulders, forward head, and stiffness c-spine  LOWER EXTREMITY ROM:     Active  Right Eval Left Eval  Hip  flexion    Hip extension    Hip abduction    Hip adduction    Hip internal rotation    Hip external rotation    Knee flexion    Knee extension    Ankle dorsiflexion    Ankle plantarflexion    Ankle inversion    Ankle eversion     (Blank rows = not tested)  LOWER EXTREMITY MMT:    MMT Right Eval Left Eval  Hip flexion 4 4-  Hip extension    Hip abduction 4 4-  Hip adduction    Hip internal rotation 4 4-  Hip external rotation 4 4-  Knee flexion 4 4-  Knee extension 4 4-  Ankle dorsiflexion 4 4  Ankle plantarflexion 4 4  Ankle inversion 4 4  Ankle eversion 4 4  (Blank rows = not tested)  BED MOBILITY:  Findings: Sit to supine Complete Independence Supine to sit Complete Independence Rolling to Right Complete Independence Rolling to Left Complete Independence  TRANSFERS: Sit to stand: SBA  Assistive device utilized: None     Stand to sit: SBA  Assistive device utilized: None      RAMP:  Not tested  CURB:  Not tested  STAIRS: Findings: Number of Stairs: 4, Height of Stairs: 6   , and Comments: use of railings GAIT: Findings: Gait Characteristics: decreased arm swing- Right, decreased arm swing- Left, decreased step length- Right, decreased step length- Left, decreased stance time- Right, decreased stance time- Left, decreased stride length, and shuffling, Distance walked: <100 feet, Assistive device utilized:None, Level of assistance: SBA, and Comments: shuffling- unsteady- stiff with minimal UE sway and stiff neck/posture  FUNCTIONAL TESTS:   5 times sit to stand: 23.62 sec without UE Support *pain LBP Timed up and go (TUG): 23.12 and 23.56 sec without AD= 23.34 sec avg 6 minute walk test: to be assessed next 1-2 visits 10 meter walk test: 12.36 sec= 0.81 m/s Berg Balance Scale:  Item Test  date: 12/22/2023 Test date:  Test date:   Sitting to standing 4. able to stand without using hands and stabilize independently Insert OPRCBERGREEVAL SmartPhrase at re-test date Insert OPRCBERGREEVAL SmartPhrase at re-test date  2. Standing unsupported 4. able to stand safely for 2 minutes    3. Sitting with back unsupported, feet supported 4. able to sit safely and securely for 2 minutes    4. Standing to sitting 4. sits safely with minimal use of hands    5. Pivot transfer  4. able to transfer safely with minor use of hands    6. Standing unsupported with eyes closed 3. able to stand 10 seconds with supervision    7. Standing unsupported with feet together 3. able to place feet together independently and stand 1 minute with supervision    8. Reaching forward with outstretched arms while standing 3. can reach forward 12 cm (5 inches)    9. Pick up object from the floor from standing 3. able to pick up slipper but needs supervision    10. Turning to look behind over left and right shoulders while standing 2. turns sideways but only maintains balance    11. Turn 360 degrees 2. able to turn 360 degrees safely but slowly    12. Place alternate foot on step or stool while standing unsupported 4. ble to stand independently and safely and complete 8 steps in 20 seconds    13. Standing unsupported one foot in front 2. able to take small step  independently and hold 30 seconds    14. Standing on one leg 1. tries to lift leg unable to hold 3 seconds but remains standing independently.      Total Score 43/56 Total Score /56 Total Score /56    Functional gait assessment: To be assessed next 1-2 visits  PATIENT SURVEYS:  ABC scale 58.13%    SUBJECTIVE Chief complaint:  Low back  History: chronic but worse after fall earlier this year.  Red flags None of the following: (bowel/bladder changes, saddle paresthesia, personal history of cancer, chills/fever, night sweats, unrelenting pain, first onset of  insidious LBP <20 y/o) Negative  Pain location: low back - central L4-S1 Pain: Present 0/10, Best 0/10, Worst 8/10 Pain quality: aching, dull, and stabbing Radiating pain: No  Numbness/Tingling: No 24 hour pain behavior: Pain with transfers and reaching/bending over Aggravating factors: transfers/reaching/bending Easing factors: rest in recliner, Medication How long can you sit: as long as I want How long can you stand: 10 min before pain stops me How long can you walk: unlimited  History of prior back injury, pain, surgery, or therapy: No Follow-up appointment with MD: No Dominant hand: right Imaging: Yes -X ray Falls in the last 6 months: Yes  Occupational demands: Not currently working  Hobbies: gardening; playing cards Goals: To be as painfree   OBJECTIVE     Gait Wide based gait- no device- stiff nature with decreased UE swing  Posture Lumbar lordosis: WNL Iliac crest height: equal bilaterally Lumbar lateral shift: negative   AROM (degrees) R/L (all movements include overpressure unless otherwise stated) Lumbar forward flexion: 25* Lumbar extension: 10* Lumbar lateral flexion: R: fingers to 2 above knee jt line L:  fingers approx 3 above lateral knee jt. Line*  *Indicates pain     Repeated Movements No centralization or peripheralization of symptoms with repeated lumbar extension or flexion.      Sensation intact to light touch bilateral LEs as determined by testing dermatomes L2-S2.    R Palpation Graded on 0-4 scale (0 = no pain, 1 = pain, 2 = pain with wincing/grimacing/flinching, 3 = pain with withdrawal, 4 = unwilling to allow palpation) Location LEFT  RIGHT           Lumbar paraspinals 2 2  Quadratus Lumborum 0 0  Gluteus Maximus 0 0  Gluteus Medius 0 0  Deep hip external rotators 0 0  PSIS 0 0  Fortin's Area (SIJ) 0 0  Greater Trochanter 0 0     Muscle Length Hamstrings: R: 33 degrees L: 22 degrees      Passive Accessory  Intervertebral Motion (PAIVM) Pt reports some reproduction of back pain with CPA L1-L5 and UPA bilaterally L1-L5. Generally hypomobile throughout    SPECIAL TESTS Lumbar Radiculopathy and Discogenic:  Slump (SN 83, -LR 0.32): R: Negative L: Negative SLR (SN 92, -LR 0.29): R: Negative L:  Negative      SIJ:  Thigh Thrust (SN 88, -LR 0.18) : R: Negative L: Negative    Functional Tasks Lifting: limited due to pain Deep squat: not tested Sit to stand: slow and guarded  6 Min Walk Test:  Instructed patient to ambulate as quickly and as safely as possible for 6 minutes using LRAD. Patient was allowed to take standing rest breaks without stopping the test, but if the patient required a sitting rest break the clock would be stopped and the test would be over.  Results: 950 feet (290 meters, Avg speed .81 m/s) using no  AD with CGA. Results indicate that the patient has reduced endurance with ambulation compared to age matched norms.  Age Matched Norms: 80-69 yo M: 94 F: 33, 44-79 yo M: 77 F: 471, 35-89 yo M: 417 F: 392 MDC: 58.21 meters (190.98 feet) or 50 meters (ANPTA Core Set of Outcome Measures for Adults with Neurologic Conditions, 2018)                                                                                                                              TREATMENT DATE: 02/07/2024    THEREX:   -Standing hamstring stretch-  hold 30 sec x 3 each LE  NMR: in // bar; gait belt; CGA  -High knee marching - down and back x 5 -Side step squat in // bars - down and back x 2 -Fwd Lunge walk- down and back x 2 -Toe walk - down and back x 5 - Tandem walk- in // bars x 3 (good heel to toe)  -Tandem walk on 2x2 board in // bars - down and back x 3 -Walking in hallway x 80 feet x 2 - horizontal head turns- calling out   TA:   Sit to stand without UE Support 2 x 10 reps with AB contract      .    PATIENT EDUCATION:    Education details: Exercise technique for HEP   Person educated: Patient and Spouse Education method: Explanation, Demonstration, Tactile cues, and Verbal cues Education comprehension: verbalized understanding, returned demonstration, verbal cues required, tactile cues required, and needs further education  HOME EXERCISE PROGRAM: Access Code: [email or web address] URL: [email or web address] Date: 01/19/2024 Prepared by: Reyes London  Exercises - Seated Hamstring Stretch  - 1 x daily - 3 sets - 30 sec hold - Seated Piriformis Stretch with Trunk Bend  - 1 x daily - 3 sets - 30 sec  hold - Seated Piriformis Stretch  - 1 x daily - 3 sets - 30 hold - Seated Lumbar Flexion Stretch  - 1 x daily - 3 sets - 30 hold - Sidelying Thoracic Lumbar Rotation  - 1 x daily - 3 sets - 30 hold - Supine Bridge  - 3 x weekly - 3 sets - 10 reps - Sit to Stand with Arms Crossed  - 3 x weekly - 3 sets - 10 reps       Access Code: [email or web address] URL: [email or web address] Date: 12/29/2023 Prepared by: Reyes London  Exercises - Supine Single Knee to Chest Stretch  - 1 x daily - 3 sets - 30 hold - Supine Lower Trunk Rotation  - 1 x daily - 3 sets - 30 sec  hold - Seated Hamstring Stretch  - 1 x daily - 3 sets - 30 hold - Supine Piriformis Stretch with Leg Straight  - 1 x daily - 3 sets - 30 hold - Seated 3 Way Exercise [employer] Stretch  - 1 x daily - 3 sets -  10 reps - Supine Bridge  - 3 x weekly - 3 sets - 10 reps    12/27/2023:Added seated hamstring, Lower trunk rotation, and knee to chest- will update and issue HEP next session. EVAL: Added Tandem standing and SLS- will need to issue handout next visit along with any Low back stretches  GOALS: Goals reviewed with patient? Yes  SHORT TERM GOALS: Target date: 02/02/2024  Pt will be independent with HEP in order to improve strength and balance in order to decrease fall risk and improve function at home. Baseline: EVAL- No formal HEP for balance Goal status:  INITIAL  LONG TERM GOALS: Target date: 03/15/2024  1.  Patient will complete five times sit to stand test in < 15 seconds indicating an increased LE strength and improved balance. Baseline: EVAL= 23.62 sec Goal status: INITIAL  2.  Patient will improve ABC score to >70%  to demonstrate statistically significant improvement in mobility and quality of life as it relates to their confidence in balance.  Baseline: EVAL= 58.13% Goal status: INITIAL   3.  Patient will increase Berg Balance score by > 6 points to demonstrate decreased fall risk during functional activities. Baseline: EVAL= 43/56 Goal status: INITIAL   4.   Patient will reduce timed up and go to <11 seconds to reduce fall risk and demonstrate improved transfer/gait ability. Baseline: EVAL= 23.34 sec without AD Goal status: INITIAL  5.   Patient will increase 10 meter walk test to >1.82m/s as to improve gait speed for better community ambulation and to reduce fall risk. Baseline: EVAL=0.81 m/s without AD Goal status: INITIAL  6.   Patient will increase six minute walk test distance to >1000 for progression to community ambulator and improve gait ability Baseline: EVAL= To be assessed next visit; 12/27/2023= 950 feet without AD Goal status: INITIAL  7. Pt will decrease mODI score by at least 13 points in order demonstrate clinically significant reduction in back pain/disability.   Baseline: will issue next visit; 12/29/2023= 42%  Goal status: NEW  8. Pt will decrease worst back pain as reported on NPRS by at least 2 points in order to demonstrate clinically significant reduction in back pain.   Baseline: 12/27/2023= 8/10 worst low back pain  Goal status: NEW 9. Patient will demonstrate 50% improved lumbar AROM for improved transfers/bed mobility, and standing posture.   Baseline: 12/27/2023- very restricted ROM (see table above)   Goal status: NEW  Pt will improve FGA by at least 3 points in order to demonstrate clinically  significant improvement in balance and decreased risk for falls.  Baseline: 01/12/2024= 15  Goal Status: NEW   ASSESSMENT:  CLINICAL IMPRESSION: Patient is a 73 y.o. male who was seen today for physical therapy treatment for chronic back pain. Treatment continued to focus on dynamic balance today. He was challenged with tandem walking and coordinating steps with marching and lunges. He did suprisingly well with horizontal head turns- able to move head well enough to call out items on sticky notes without LOB or report of any pain.  Patient will benefit from skilled PT services to address his low back symptoms, improve balance, and decrease risk for future falls.   OBJECTIVE IMPAIRMENTS: Abnormal gait, decreased activity tolerance, decreased balance, decreased cognition, decreased coordination, decreased endurance, decreased mobility, difficulty walking, decreased strength, and pain.   ACTIVITY LIMITATIONS: carrying, lifting, bending, sitting, standing, squatting, stairs, and transfers  PARTICIPATION LIMITATIONS: meal prep, cleaning, laundry, shopping, community activity, and yard work  PERSONAL FACTORS:  1-2 comorbidities: anoxic brain injury; depression are also affecting patient's functional outcome.   REHAB POTENTIAL: Good  CLINICAL DECISION MAKING: Evolving/moderate complexity  EVALUATION COMPLEXITY: Moderate  PLAN:  PT FREQUENCY: 1-2x/week  PT DURATION: 12 weeks  PLANNED INTERVENTIONS: 97164- PT Re-evaluation, 97750- Physical Performance Testing, 97110-Therapeutic exercises, 97530- Therapeutic activity, [id number]- Neuromuscular re-education, 97535- Self Care, [id number]- Manual therapy, [phone]- Gait training, [phone]- Orthotic Initial, [phone]- Orthotic/Prosthetic subsequent, [phone]- Canalith repositioning, Patient/Family education, Balance training, Stair training, Taping, Joint mobilization, Joint manipulation, Spinal manipulation, Spinal mobilization, Compression bandaging, Vestibular training,  DME instructions, Cryotherapy, and Moist heat  PLAN FOR NEXT SESSION:  Review and progress HEP Continue to progress balance  Continue to progress core/low back strengthening Cog dual task with dynamic balance    Reyes LOISE London, PT 02/08/2024, 7:52 AM

## 2024-02-09 ENCOUNTER — Ambulatory Visit

## 2024-02-09 DIAGNOSIS — R2681 Unsteadiness on feet: Secondary | ICD-10-CM | POA: Diagnosis not present

## 2024-02-09 DIAGNOSIS — R278 Other lack of coordination: Secondary | ICD-10-CM | POA: Diagnosis not present

## 2024-02-09 DIAGNOSIS — R2689 Other abnormalities of gait and mobility: Secondary | ICD-10-CM

## 2024-02-09 DIAGNOSIS — R296 Repeated falls: Secondary | ICD-10-CM | POA: Diagnosis not present

## 2024-02-09 DIAGNOSIS — G8929 Other chronic pain: Secondary | ICD-10-CM | POA: Diagnosis not present

## 2024-02-09 DIAGNOSIS — R269 Unspecified abnormalities of gait and mobility: Secondary | ICD-10-CM

## 2024-02-09 DIAGNOSIS — R262 Difficulty in walking, not elsewhere classified: Secondary | ICD-10-CM

## 2024-02-09 DIAGNOSIS — M6281 Muscle weakness (generalized): Secondary | ICD-10-CM | POA: Diagnosis not present

## 2024-02-09 DIAGNOSIS — M5442 Lumbago with sciatica, left side: Secondary | ICD-10-CM | POA: Diagnosis not present

## 2024-02-09 NOTE — Therapy (Signed)
 OUTPATIENT PHYSICAL THERAPY NEURO AND LOW BACK TREATMENT/Physical Therapy Progress Note   Dates of reporting period  12/22/2023   to   02/09/2024    Patient Name: Larry [name] MRN: [id number] DOB:May 23, 1951, 73 y.o., male Today's Date: 02/10/2024   PCP: Dr. Arlyss Solian REFERRING PROVIDER: Dr. Arlyss Solian  END OF SESSION:  PT End of Session - 02/10/24 0736     Visit Number 10    Number of Visits 24    Date for PT Re-Evaluation 03/15/24    Progress Note Due on Visit 20    PT Start Time 0932    PT Stop Time 1013    PT Time Calculation (min) 41 min    Equipment Utilized During Treatment Gait belt    Activity Tolerance Patient tolerated treatment well    Behavior During Therapy WFL for tasks assessed/performed               Past Medical History:  Diagnosis Date   ADD (attention deficit disorder)    Allergy    seasonal flares of perinneal allergies   Cancer (HCC)    melanoma   Cardiac arrest (HCC) 07/1997   aborted   Dementia (HCC)    post resusitative   Depression    Diverticulosis    Family hx of prostate cancer    GERD (gastroesophageal reflux disease)    Headache(784.0)    Hyperlipidemia    Hypothyroidism    ICD (implantable cardiac defibrillator) in place    Idiopathic urticaria    Internal hemorrhoids    Low back pain syndrome    Melanoma in situ of skin of trunk (HCC)    chest   PVC (premature ventricular contraction)    associated with cardiac arrest   Sleep apnea    no c-pap   Tubular adenoma of colon 10/2010   Past Surgical History:  Procedure Laterality Date    implantation x2     CARDIAC DEFIBRILLATOR PLACEMENT  07/01/2010   Explanation of a previously implanted device, pocket revision, and insertion of a new device, and intraoperative defibrillation threshold testing Robyne)   CARDIAC DEFIBRILLATOR REMOVAL  07/05/2002   Defibrillator change Robyne) 3 times changed   CATARACT EXTRACTION W/PHACO Left 02/04/2021   Procedure: CATARACT  EXTRACTION PHACO AND INTRAOCULAR LENS PLACEMENT (IOC) LEFT VIVITY toric LENS 9.21 01:11.6;  Surgeon: Mittie Gaskin, MD;  Location: [facility];  Service: Ophthalmology;  Laterality: Left;  keep patient at 11:30 arrival   CATARACT EXTRACTION W/PHACO Right 03/04/2021   Procedure: CATARACT EXTRACTION PHACO AND INTRAOCULAR LENS PLACEMENT (IOC) RIGHT VIVITY LENS 8.59 01:15.3;  Surgeon: Mittie Gaskin, MD;  Location: [facility];  Service: Ophthalmology;  Laterality: Right;   COLONOSCOPY     HEMORRHOID SURGERY     ICD     TONSILLECTOMY     Patient Active Problem List   Diagnosis Date Noted   Back pain 11/13/2023   Cervical dystonia 06/15/2022   Neck pain 07/02/2021   Gait abnormality 12/16/2020   Nonintractable headache 09/30/2020   Tinnitus 09/09/2019   History of sudden cardiac arrest 01/09/2019   Viral syndrome 09/06/2018   Seizures (HCC) 03/02/2018   Syncope 12/26/2017   Other social stressor 12/26/2017   Health care maintenance 08/14/2017   Family hx of prostate cancer    Allergy    Insomnia 10/20/2015   Advance care planning 03/08/2014   Memory loss 09/14/2013   Hypoxic brain injury (HCC) 09/14/2013   Medicare annual wellness visit, subsequent 02/16/2013  Shortness of breath 08/10/2011   Implantable cardioverter-defibrillator (ICD) in situ 02/03/2009   Hypothyroidism 11/18/2008   CONTACT DERMATITIS&OTHER ECZEMA DUE TO PLANTS 02/03/2008   IDIOPATHIC URTICARIA 02/03/2008   Hypertension 02/03/2008   HLD (hyperlipidemia) Nov 22, 2007   Attention deficit disorder 11/22/2007   PVC/VT non sustained 11-22-07   Sleep apnea Nov 22, 2007   HEADACHE 11-22-07   SUDDEN DEATH-aborted 11-22-07   LOW BACK PAIN SYNDROME 10/11/2007    ONSET DATE: worse since April 2025  REFERRING DIAG:  Z91.81 (ICD-10-CM) - History of fall  R26.9 (ICD-10-CM) - Gait abnormality  M54.9 (ICD-10-CM) - Back pain, unspecified back location, unspecified back pain laterality,  unspecified chronicity    THERAPY DIAG:  Muscle weakness (generalized)  Abnormality of gait and mobility  Difficulty in walking, not elsewhere classified  Other lack of coordination  Unsteadiness on feet  Chronic midline low back pain with left-sided sciatica  Other abnormalities of gait and mobility  Rationale for Evaluation and Treatment: Rehabilitation  SUBJECTIVE:                                                                                                                                                                                             SUBJECTIVE STATEMENT: Patient reports doing better after last session and reports still stiff but no falls and wife reports he has been compliant with stretching but still holding his breath during exercises.     Pt accompanied by: significant other  PERTINENT HISTORY: Patient reports fall in April with subsequent report of low back. PMH includes: Anoxic brain injury in 1999 due to prolonged cardiac arrest, with residual mild cognitive impairment- Slow worsening over the years. He was most recently seen here in outpatient PT clinic for chronic neck pain with good results - now here with order for abnormal gait and low back pain.   PAIN:  Are you having pain? Yes: NPRS scale: current=2 /10; worst 8/10; best= 0/10 Pain location: Low back; Neck pain Pain description: Sharp with movement Aggravating factors: Transfers, bending, prolonged sitting/standing, any twisting; lifting Relieving factors: pain meds, rest, heat  PRECAUTIONS: Fall  RED FLAGS: None   WEIGHT BEARING RESTRICTIONS: No  FALLS: Has patient fallen in last 6 months? Yes. Number of falls 3  LIVING ENVIRONMENT: Lives with: lives with their spouse Lives in: House/apartment Stairs: No Has following equipment at home: None  PLOF: Independent  PATIENT GOALS: Patient reports wanting to feel better and have less pain and not fall  OBJECTIVE:  Note: Objective  measures were completed at Evaluation unless otherwise noted.  DIAGNOSTIC FINDINGS: None recent  COGNITION: Overall cognitive status: History of cognitive impairments -  at baseline   SENSATION: Light touch: WFL  COORDINATION: Delay reaction time with heel to shin and several other tasks during balance testing today.   EDEMA:  None observed  POSTURE: rounded shoulders, forward head, and stiffness c-spine  LOWER EXTREMITY ROM:     Active  Right Eval Left Eval  Hip flexion    Hip extension    Hip abduction    Hip adduction    Hip internal rotation    Hip external rotation    Knee flexion    Knee extension    Ankle dorsiflexion    Ankle plantarflexion    Ankle inversion    Ankle eversion     (Blank rows = not tested)  LOWER EXTREMITY MMT:    MMT Right Eval Left Eval  Hip flexion 4 4-  Hip extension    Hip abduction 4 4-  Hip adduction    Hip internal rotation 4 4-  Hip external rotation 4 4-  Knee flexion 4 4-  Knee extension 4 4-  Ankle dorsiflexion 4 4  Ankle plantarflexion 4 4  Ankle inversion 4 4  Ankle eversion 4 4  (Blank rows = not tested)  BED MOBILITY:  Findings: Sit to supine Complete Independence Supine to sit Complete Independence Rolling to Right Complete Independence Rolling to Left Complete Independence  TRANSFERS: Sit to stand: SBA  Assistive device utilized: None     Stand to sit: SBA  Assistive device utilized: None      RAMP:  Not tested  CURB:  Not tested  STAIRS: Findings: Number of Stairs: 4, Height of Stairs: 6   , and Comments: use of railings GAIT: Findings: Gait Characteristics: decreased arm swing- Right, decreased arm swing- Left, decreased step length- Right, decreased step length- Left, decreased stance time- Right, decreased stance time- Left, decreased stride length, and shuffling, Distance walked: <100 feet, Assistive device utilized:None, Level of assistance: SBA, and Comments: shuffling- unsteady- stiff with  minimal UE sway and stiff neck/posture  FUNCTIONAL TESTS:   5 times sit to stand: 23.62 sec without UE Support *pain LBP Timed up and go (TUG): 23.12 and 23.56 sec without AD= 23.34 sec avg 6 minute walk test: to be assessed next 1-2 visits 10 meter walk test: 12.36 sec= 0.81 m/s Berg Balance Scale:  Item Test date: 12/22/2023 Test date:  Test date:   Sitting to standing 4. able to stand without using hands and stabilize independently Insert OPRCBERGREEVAL SmartPhrase at re-test date Insert OPRCBERGREEVAL SmartPhrase at re-test date  2. Standing unsupported 4. able to stand safely for 2 minutes    3. Sitting with back unsupported, feet supported 4. able to sit safely and securely for 2 minutes    4. Standing to sitting 4. sits safely with minimal use of hands    5. Pivot transfer  4. able to transfer safely with minor use of hands    6. Standing unsupported with eyes closed 3. able to stand 10 seconds with supervision    7. Standing unsupported with feet together 3. able to place feet together independently and stand 1 minute with supervision    8. Reaching forward with outstretched arms while standing 3. can reach forward 12 cm (5 inches)    9. Pick up object from the floor from standing 3. able to pick up slipper but needs supervision    10. Turning to look behind over left and right shoulders while standing 2. turns sideways but only maintains balance    11.  Turn 360 degrees 2. able to turn 360 degrees safely but slowly    12. Place alternate foot on step or stool while standing unsupported 4. ble to stand independently and safely and complete 8 steps in 20 seconds    13. Standing unsupported one foot in front 2. able to take small step independently and hold 30 seconds    14. Standing on one leg 1. tries to lift leg unable to hold 3 seconds but remains standing independently.      Total Score 43/56 Total Score /56 Total Score /56    Functional gait assessment: To be assessed next 1-2  visits  PATIENT SURVEYS:  ABC scale 58.13%    SUBJECTIVE Chief complaint:  Low back  History: chronic but worse after fall earlier this year.  Red flags None of the following: (bowel/bladder changes, saddle paresthesia, personal history of cancer, chills/fever, night sweats, unrelenting pain, first onset of insidious LBP <20 y/o) Negative  Pain location: low back - central L4-S1 Pain: Present 0/10, Best 0/10, Worst 8/10 Pain quality: aching, dull, and stabbing Radiating pain: No  Numbness/Tingling: No 24 hour pain behavior: Pain with transfers and reaching/bending over Aggravating factors: transfers/reaching/bending Easing factors: rest in recliner, Medication How long can you sit: as long as I want How long can you stand: 10 min before pain stops me How long can you walk: unlimited  History of prior back injury, pain, surgery, or therapy: No Follow-up appointment with MD: No Dominant hand: right Imaging: Yes -X ray Falls in the last 6 months: Yes  Occupational demands: Not currently working  Hobbies: gardening; playing cards Goals: To be as painfree   OBJECTIVE     Gait Wide based gait- no device- stiff nature with decreased UE swing  Posture Lumbar lordosis: WNL Iliac crest height: equal bilaterally Lumbar lateral shift: negative   AROM (degrees) R/L (all movements include overpressure unless otherwise stated) Lumbar forward flexion: 25* Lumbar extension: 10* Lumbar lateral flexion: R: fingers to 2 above knee jt line L:  fingers approx 3 above lateral knee jt. Line*  *Indicates pain     Repeated Movements No centralization or peripheralization of symptoms with repeated lumbar extension or flexion.      Sensation intact to light touch bilateral LEs as determined by testing dermatomes L2-S2.    R Palpation Graded on 0-4 scale (0 = no pain, 1 = pain, 2 = pain with wincing/grimacing/flinching, 3 = pain with withdrawal, 4 = unwilling to allow  palpation) Location LEFT  RIGHT           Lumbar paraspinals 2 2  Quadratus Lumborum 0 0  Gluteus Maximus 0 0  Gluteus Medius 0 0  Deep hip external rotators 0 0  PSIS 0 0  Fortin's Area (SIJ) 0 0  Greater Trochanter 0 0     Muscle Length Hamstrings: R: 33 degrees L: 22 degrees      Passive Accessory Intervertebral Motion (PAIVM) Pt reports some reproduction of back pain with CPA L1-L5 and UPA bilaterally L1-L5. Generally hypomobile throughout    SPECIAL TESTS Lumbar Radiculopathy and Discogenic:  Slump (SN 83, -LR 0.32): R: Negative L: Negative SLR (SN 92, -LR 0.29): R: Negative L:  Negative      SIJ:  Thigh Thrust (SN 88, -LR 0.18) : R: Negative L: Negative    Functional Tasks Lifting: limited due to pain Deep squat: not tested Sit to stand: slow and guarded  6 Min Walk Test:  Instructed patient to ambulate  as quickly and as safely as possible for 6 minutes using LRAD. Patient was allowed to take standing rest breaks without stopping the test, but if the patient required a sitting rest break the clock would be stopped and the test would be over.  Results: 950 feet (290 meters, Avg speed .81 m/s) using no AD with CGA. Results indicate that the patient has reduced endurance with ambulation compared to age matched norms.  Age Matched Norms: 33-69 yo M: 60 F: 37, 25-79 yo M: 74 F: 471, 13-89 yo M: 417 F: 392 MDC: 58.21 meters (190.98 feet) or 50 meters (ANPTA Core Set of Outcome Measures for Adults with Neurologic Conditions, 2018)                                                                                                                              TREATMENT DATE: 02/09/2024    Physical therapy treatment session today consisted of completing assessment of goals and administration of testing as demonstrated and documented in flow sheet, treatment, and goals section of this note. Addition treatments may be found below.   Pt performed 5 time sit<>stand (5xSTS):  18.54 sec without UE SUpport (>15 sec indicates increased fall risk)    PT instructed pt in TUG: 15.2 sec (average of 3 trials; >13.5 sec indicates increased fall risk)   10 Meter Walk Test: Patient instructed to walk 10 meters (32.8 ft) as quickly and as safely as possible at their normal speed x2 and at a fast speed x2. Time measured from 2 meter mark to 8 meter mark to accommodate ramp-up and ramp-down.  Normal speed 1: 0.76 m/s Normal speed 2: 0.81 m/s Average Normal speed: 0.79 m/s Cut off scores: <0.4 m/s = household Ambulator, 0.4-0.8 m/s = limited community Ambulator, >0.8 m/s = community Ambulator, >1.2 m/s = crossing a street, <1.0 = increased fall risk MCID 0.05 m/s (small), 0.13 m/s (moderate), 0.06 m/s (significant)  (ANPTA Core Set of Outcome Measures for Adults with Neurologic Conditions, 2018)  6 Min Walk Test:  Instructed patient to ambulate as quickly and as safely as possible for 6 minutes using LRAD. Patient was allowed to take standing rest breaks without stopping the test, but if the patient required a sitting rest break the clock would be stopped and the test would be over.  Results: 1145 feet (290 meters, Avg speed .81 m/s) using no AD with CGA. Results indicate that the patient has reduced endurance with ambulation compared to age matched norms.  Age Matched Norms: 24-69 yo M: 38 F: 77, 74-79 yo M: 31 F: 471, 2-89 yo M: 417 F: 392 MDC: 58.21 meters (190.98 feet) or 50 meters (ANPTA Core Set of Outcome Measures for Adults with Neurologic Conditions, 2018)   The Activities-Specific Balance Confidence (ABC) Scale 0% 10 20 30  40 50 60 70 80 90 100% No confidence<->completely confident  "How confident are you that you will not lose your balance or become  unsteady when you . . .   Date tested 02/09/2024  Walk around the house 80%  2. Walk up or down stairs 50%  3. Bend over and pick up a slipper from in front of a closet floor 60%  4. Reach for a small can off a shelf  at eye level 90%  5. Stand on tip toes and reach for something above your head 10%  6. Stand on a chair and reach for something 30%  7. Sweep the floor 80%  8. Walk outside the house to a car parked in the driveway 70%  9. Get into or out of a car 70%  10. Walk across a parking lot to the mall 60%  11. Walk up or down a ramp 50%  12. Walk in a crowded mall where people rapidly walk past you 90%  13. Are bumped into by people as you walk through the mall 10%  14. Step onto or off of an escalator while you are holding onto the railing 70%  15. Step onto or off an escalator while holding onto parcels such that you cannot hold onto the railing 0%  16. Walk outside on icy sidewalks 0%  Total: #/16 51.25%      THEREX:  -forward trunk stretch (Sitting in chair)  -Upper trunk rotation stretch (sitting in chair)  -Standing hamstring stretch-  hold 30 sec x 3 each LE    PATIENT EDUCATION:    Education details: Exercise technique for HEP  Person educated: Patient and Spouse Education method: Explanation, Demonstration, Tactile cues, and Verbal cues Education comprehension: verbalized understanding, returned demonstration, verbal cues required, tactile cues required, and needs further education  HOME EXERCISE PROGRAM: Access Code: [email or web address] URL: [email or web address] Date: 01/19/2024 Prepared by: Reyes London  Exercises - Seated Hamstring Stretch  - 1 x daily - 3 sets - 30 sec hold - Seated Piriformis Stretch with Trunk Bend  - 1 x daily - 3 sets - 30 sec  hold - Seated Piriformis Stretch  - 1 x daily - 3 sets - 30 hold - Seated Lumbar Flexion Stretch  - 1 x daily - 3 sets - 30 hold - Sidelying Thoracic Lumbar Rotation  - 1 x daily - 3 sets - 30 hold - Supine Bridge  - 3 x weekly - 3 sets - 10 reps - Sit to Stand with Arms Crossed  - 3 x weekly - 3 sets - 10 reps       Access Code: [email or web address] URL: [email or web address] Date:  12/29/2023 Prepared by: Reyes London  Exercises - Supine Single Knee to Chest Stretch  - 1 x daily - 3 sets - 30 hold - Supine Lower Trunk Rotation  - 1 x daily - 3 sets - 30 sec  hold - Seated Hamstring Stretch  - 1 x daily - 3 sets - 30 hold - Supine Piriformis Stretch with Leg Straight  - 1 x daily - 3 sets - 30 hold - Seated 3 Way Exercise Ball Roll Out Stretch  - 1 x daily - 3 sets - 10 reps - Supine Bridge  - 3 x weekly - 3 sets - 10 reps    12/27/2023:Added seated hamstring, Lower trunk rotation, and knee to chest- will update and issue HEP next session. EVAL: Added Tandem standing and SLS- will need to issue handout next visit along with any Low back stretches  GOALS: Goals reviewed with patient? Yes  SHORT TERM GOALS: Target date: 02/02/2024  Pt will be  independent with HEP in order to improve strength and balance in order to decrease fall risk and improve function at home. Baseline: EVAL- No formal HEP for balance; 02/09/2024= Patient and wife report independence but still working through breathing when working out.  Goal status: MET  LONG TERM GOALS: Target date: 03/15/2024  1.  Patient will complete five times sit to stand test in < 15 seconds indicating an increased LE strength and improved balance. Baseline: EVAL= 23.62 sec; 02/09/2024= 18.54 sec without UE SUpport Goal status: PROGRESSING  2.  Patient will improve ABC score to >70%  to demonstrate statistically significant improvement in mobility and quality of life as it relates to their confidence in balance.  Baseline: EVAL= 58.13%; 02/09/2024= 51.25% Goal status: ONGOING   3.  Patient will increase Berg Balance score by > 6 points to demonstrate decreased fall risk during functional activities. Baseline: EVAL= 43/56 Goal status: INITIAL   4.   Patient will reduce timed up and go to <11 seconds to reduce fall risk and demonstrate improved transfer/gait ability. Baseline: EVAL= 23.34 sec without AD; 02/09/2024=  15.2 sec without AD Goal status: PROGRESSING  5.   Patient will increase 10 meter walk test to >1.23m/s as to improve gait speed for better community ambulation and to reduce fall risk. Baseline: EVAL=0.81 m/s without AD; 02/09/2024= 0.79 m/s avg without AD Goal status: Ongoing  6.   Patient will increase six minute walk test distance to >1000 for progression to community ambulator and improve gait ability Baseline: EVAL= To be assessed next visit; 12/27/2023= 950 feet without AD; 02/09/2024= 1145 feet without an AD Goal status: INITIAL  7. Pt will decrease mODI score by at least 13 points in order demonstrate clinically significant reduction in back pain/disability.   Baseline: will issue next visit; 12/29/2023= 42%  Goal status: NEW  8. Pt will decrease worst back pain as reported on NPRS by at least 2 points in order to demonstrate clinically significant reduction in back pain.   Baseline: 12/27/2023= 8/10 worst low back pain; 02/09/2024 = 6/10 LBP  Goal status: NEW 9. Patient will demonstrate 50% improved lumbar AROM for improved transfers/bed mobility, and standing posture.   Baseline: 12/27/2023- very restricted ROM (see table above)   Goal status: NEW  Pt will improve FGA by at least 3 points in order to demonstrate clinically significant improvement in balance and decreased risk for falls.  Baseline: 01/12/2024= 15  Goal Status: NEW   ASSESSMENT:  CLINICAL IMPRESSION: Patient is a 73 y.o. male who was seen today for physical therapy treatment for chronic back pain. Many goals were reassessed due to progress note visit but will need to further test next session since treating both LBP and imbalance. He was able to demonstrate improvement with report of low back pain- now reporting a 6/10 and was able to demonstrate improved gait quality and distance with 6 min walk test.  He subjectively reported decrease score in ABC confidence but reports overall feeling a little better with his  balance. Will need to continue testing goals next visit- limited on time today. Patient's condition has the potential to improve in response to therapy. Maximum improvement is yet to be obtained. The anticipated improvement is attainable and reasonable in a generally predictable time.  Patient will benefit from skilled PT services to address his low back symptoms, improve balance, and decrease risk for future falls.   OBJECTIVE IMPAIRMENTS: Abnormal gait, decreased activity tolerance, decreased balance, decreased cognition, decreased coordination, decreased endurance,  decreased mobility, difficulty walking, decreased strength, and pain.   ACTIVITY LIMITATIONS: carrying, lifting, bending, sitting, standing, squatting, stairs, and transfers  PARTICIPATION LIMITATIONS: meal prep, cleaning, laundry, shopping, community activity, and yard work  PERSONAL FACTORS: 1-2 comorbidities: anoxic brain injury; depression are also affecting patient's functional outcome.   REHAB POTENTIAL: Good  CLINICAL DECISION MAKING: Evolving/moderate complexity  EVALUATION COMPLEXITY: Moderate  PLAN:  PT FREQUENCY: 1-2x/week  PT DURATION: 12 weeks  PLANNED INTERVENTIONS: [id number]- PT Re-evaluation, 97750- Physical Performance Testing, 97110-Therapeutic exercises, 97530- Therapeutic activity, [id number]- Neuromuscular re-education, 97535- Self Care, [id number]- Manual therapy, [phone]- Gait training, [phone]- Orthotic Initial, [phone]- Orthotic/Prosthetic subsequent, [phone]- Canalith repositioning, Patient/Family education, Balance training, Stair training, Taping, Joint mobilization, Joint manipulation, Spinal manipulation, Spinal mobilization, Compression bandaging, Vestibular training, DME instructions, Cryotherapy, and Moist heat  PLAN FOR NEXT SESSION:  Review and progress HEP Continue to progress balance  Continue to progress core/low back strengthening Cog dual task with dynamic balance    Reyes LOISE London, PT 02/10/2024,  7:51 AM

## 2024-02-13 ENCOUNTER — Ambulatory Visit

## 2024-02-14 ENCOUNTER — Ambulatory Visit

## 2024-02-14 DIAGNOSIS — M5442 Lumbago with sciatica, left side: Secondary | ICD-10-CM | POA: Diagnosis not present

## 2024-02-14 DIAGNOSIS — R262 Difficulty in walking, not elsewhere classified: Secondary | ICD-10-CM

## 2024-02-14 DIAGNOSIS — R278 Other lack of coordination: Secondary | ICD-10-CM | POA: Diagnosis not present

## 2024-02-14 DIAGNOSIS — R2689 Other abnormalities of gait and mobility: Secondary | ICD-10-CM | POA: Diagnosis not present

## 2024-02-14 DIAGNOSIS — M6281 Muscle weakness (generalized): Secondary | ICD-10-CM | POA: Diagnosis not present

## 2024-02-14 DIAGNOSIS — R2681 Unsteadiness on feet: Secondary | ICD-10-CM

## 2024-02-14 DIAGNOSIS — R269 Unspecified abnormalities of gait and mobility: Secondary | ICD-10-CM

## 2024-02-14 DIAGNOSIS — G8929 Other chronic pain: Secondary | ICD-10-CM

## 2024-02-14 DIAGNOSIS — R296 Repeated falls: Secondary | ICD-10-CM | POA: Diagnosis not present

## 2024-02-14 NOTE — Therapy (Signed)
 OUTPATIENT PHYSICAL THERAPY NEURO AND LOW BACK TREATMENT   Patient Name: Larry [name] MRN: [id number] DOB:1950/08/08, 73 y.o., male Today's Date: 02/14/2024   PCP: Dr. Arlyss Solian REFERRING PROVIDER: Dr. Arlyss Solian  END OF SESSION:  PT End of Session - 02/14/24 0936     Visit Number 11    Number of Visits 24    Date for PT Re-Evaluation 03/15/24    Progress Note Due on Visit 20    PT Start Time 0932    PT Stop Time 1014    PT Time Calculation (min) 42 min    Equipment Utilized During Treatment Gait belt    Activity Tolerance Patient tolerated treatment well    Behavior During Therapy WFL for tasks assessed/performed                Past Medical History:  Diagnosis Date   ADD (attention deficit disorder)    Allergy    seasonal flares of perinneal allergies   Cancer (HCC)    melanoma   Cardiac arrest (HCC) 07/1997   aborted   Dementia (HCC)    post resusitative   Depression    Diverticulosis    Family hx of prostate cancer    GERD (gastroesophageal reflux disease)    Headache(784.0)    Hyperlipidemia    Hypothyroidism    ICD (implantable cardiac defibrillator) in place    Idiopathic urticaria    Internal hemorrhoids    Low back pain syndrome    Melanoma in situ of skin of trunk (HCC)    chest   PVC (premature ventricular contraction)    associated with cardiac arrest   Sleep apnea    no c-pap   Tubular adenoma of colon 10/2010   Past Surgical History:  Procedure Laterality Date    implantation x2     CARDIAC DEFIBRILLATOR PLACEMENT  07/01/2010   Explanation of a previously implanted device, pocket revision, and insertion of a new device, and intraoperative defibrillation threshold testing Robyne)   CARDIAC DEFIBRILLATOR REMOVAL  07/05/2002   Defibrillator change Robyne) 3 times changed   CATARACT EXTRACTION W/PHACO Left 02/04/2021   Procedure: CATARACT EXTRACTION PHACO AND INTRAOCULAR LENS PLACEMENT (IOC) LEFT VIVITY toric LENS 9.21  01:11.6;  Surgeon: Mittie Gaskin, MD;  Location: [facility];  Service: Ophthalmology;  Laterality: Left;  keep patient at 11:30 arrival   CATARACT EXTRACTION W/PHACO Right 03/04/2021   Procedure: CATARACT EXTRACTION PHACO AND INTRAOCULAR LENS PLACEMENT (IOC) RIGHT VIVITY LENS 8.59 01:15.3;  Surgeon: Mittie Gaskin, MD;  Location: [facility];  Service: Ophthalmology;  Laterality: Right;   COLONOSCOPY     HEMORRHOID SURGERY     ICD     TONSILLECTOMY     Patient Active Problem List   Diagnosis Date Noted   Back pain 11/13/2023   Cervical dystonia 06/15/2022   Neck pain 07/02/2021   Gait abnormality 12/16/2020   Nonintractable headache 09/30/2020   Tinnitus 09/09/2019   History of sudden cardiac arrest 01/09/2019   Viral syndrome 09/06/2018   Seizures (HCC) 03/02/2018   Syncope 12/26/2017   Other social stressor 12/26/2017   Health care maintenance 08/14/2017   Family hx of prostate cancer    Allergy    Insomnia 10/20/2015   Advance care planning 03/08/2014   Memory loss 09/14/2013   Hypoxic brain injury (HCC) 09/14/2013   Medicare annual wellness visit, subsequent 02/16/2013   Shortness of breath 08/10/2011   Implantable cardioverter-defibrillator (ICD) in situ 02/03/2009   Hypothyroidism 11/18/2008  CONTACT DERMATITIS&OTHER ECZEMA DUE TO PLANTS 02/03/2008   IDIOPATHIC URTICARIA 02/03/2008   Hypertension 02/03/2008   HLD (hyperlipidemia) Nov 10, 2007   Attention deficit disorder 2007/11/10   PVC/VT non sustained 10-Nov-2007   Sleep apnea Nov 10, 2007   HEADACHE 2007-11-10   SUDDEN DEATH-aborted Nov 10, 2007   LOW BACK PAIN SYNDROME 10/11/2007    ONSET DATE: worse since April 2025  REFERRING DIAG:  Z91.81 (ICD-10-CM) - History of fall  R26.9 (ICD-10-CM) - Gait abnormality  M54.9 (ICD-10-CM) - Back pain, unspecified back location, unspecified back pain laterality, unspecified chronicity    THERAPY DIAG:  Muscle weakness  (generalized)  Difficulty in walking, not elsewhere classified  Abnormality of gait and mobility  Other lack of coordination  Unsteadiness on feet  Chronic midline low back pain with left-sided sciatica  Rationale for Evaluation and Treatment: Rehabilitation  SUBJECTIVE:                                                                                                                                                                                             SUBJECTIVE STATEMENT: Patient reports having a good weekend and no falls. States trying to be compliant with HEP.      Pt accompanied by: significant other  PERTINENT HISTORY: Patient reports fall in April with subsequent report of low back. PMH includes: Anoxic brain injury in 1999 due to prolonged cardiac arrest, with residual mild cognitive impairment- Slow worsening over the years. He was most recently seen here in outpatient PT clinic for chronic neck pain with good results - now here with order for abnormal gait and low back pain.   PAIN:  Are you having pain? Yes: NPRS scale: current=2 /10; worst 8/10; best= 0/10 Pain location: Low back; Neck pain Pain description: Sharp with movement Aggravating factors: Transfers, bending, prolonged sitting/standing, any twisting; lifting Relieving factors: pain meds, rest, heat  PRECAUTIONS: Fall  RED FLAGS: None   WEIGHT BEARING RESTRICTIONS: No  FALLS: Has patient fallen in last 6 months? Yes. Number of falls 3  LIVING ENVIRONMENT: Lives with: lives with their spouse Lives in: House/apartment Stairs: No Has following equipment at home: None  PLOF: Independent  PATIENT GOALS: Patient reports wanting to feel better and have less pain and not fall  OBJECTIVE:  Note: Objective measures were completed at Evaluation unless otherwise noted.  DIAGNOSTIC FINDINGS: None recent  COGNITION: Overall cognitive status: History of cognitive impairments - at  baseline   SENSATION: Light touch: WFL  COORDINATION: Delay reaction time with heel to shin and several other tasks during balance testing today.   EDEMA:  None observed  POSTURE: rounded shoulders, forward head,  and stiffness c-spine  LOWER EXTREMITY ROM:     Active  Right Eval Left Eval  Hip flexion    Hip extension    Hip abduction    Hip adduction    Hip internal rotation    Hip external rotation    Knee flexion    Knee extension    Ankle dorsiflexion    Ankle plantarflexion    Ankle inversion    Ankle eversion     (Blank rows = not tested)  LOWER EXTREMITY MMT:    MMT Right Eval Left Eval  Hip flexion 4 4-  Hip extension    Hip abduction 4 4-  Hip adduction    Hip internal rotation 4 4-  Hip external rotation 4 4-  Knee flexion 4 4-  Knee extension 4 4-  Ankle dorsiflexion 4 4  Ankle plantarflexion 4 4  Ankle inversion 4 4  Ankle eversion 4 4  (Blank rows = not tested)  BED MOBILITY:  Findings: Sit to supine Complete Independence Supine to sit Complete Independence Rolling to Right Complete Independence Rolling to Left Complete Independence  TRANSFERS: Sit to stand: SBA  Assistive device utilized: None     Stand to sit: SBA  Assistive device utilized: None      RAMP:  Not tested  CURB:  Not tested  STAIRS: Findings: Number of Stairs: 4, Height of Stairs: 6   , and Comments: use of railings GAIT: Findings: Gait Characteristics: decreased arm swing- Right, decreased arm swing- Left, decreased step length- Right, decreased step length- Left, decreased stance time- Right, decreased stance time- Left, decreased stride length, and shuffling, Distance walked: <100 feet, Assistive device utilized:None, Level of assistance: SBA, and Comments: shuffling- unsteady- stiff with minimal UE sway and stiff neck/posture  FUNCTIONAL TESTS:   5 times sit to stand: 23.62 sec without UE Support *pain LBP Timed up and go (TUG): 23.12 and 23.56 sec without  AD= 23.34 sec avg 6 minute walk test: to be assessed next 1-2 visits 10 meter walk test: 12.36 sec= 0.81 m/s Berg Balance Scale:  Item Test date: 12/22/2023 Test date:  Test date:   Sitting to standing 4. able to stand without using hands and stabilize independently Insert OPRCBERGREEVAL SmartPhrase at re-test date Insert OPRCBERGREEVAL SmartPhrase at re-test date  2. Standing unsupported 4. able to stand safely for 2 minutes    3. Sitting with back unsupported, feet supported 4. able to sit safely and securely for 2 minutes    4. Standing to sitting 4. sits safely with minimal use of hands    5. Pivot transfer  4. able to transfer safely with minor use of hands    6. Standing unsupported with eyes closed 3. able to stand 10 seconds with supervision    7. Standing unsupported with feet together 3. able to place feet together independently and stand 1 minute with supervision    8. Reaching forward with outstretched arms while standing 3. can reach forward 12 cm (5 inches)    9. Pick up object from the floor from standing 3. able to pick up slipper but needs supervision    10. Turning to look behind over left and right shoulders while standing 2. turns sideways but only maintains balance    11. Turn 360 degrees 2. able to turn 360 degrees safely but slowly    12. Place alternate foot on step or stool while standing unsupported 4. ble to stand independently and safely and complete 8 steps  in 20 seconds    13. Standing unsupported one foot in front 2. able to take small step independently and hold 30 seconds    14. Standing on one leg 1. tries to lift leg unable to hold 3 seconds but remains standing independently.      Total Score 43/56 Total Score /56 Total Score /56    Functional gait assessment: To be assessed next 1-2 visits  PATIENT SURVEYS:  ABC scale 58.13%    SUBJECTIVE Chief complaint:  Low back  History: chronic but worse after fall earlier this year.  Red flags None of the  following: (bowel/bladder changes, saddle paresthesia, personal history of cancer, chills/fever, night sweats, unrelenting pain, first onset of insidious LBP <20 y/o) Negative  Pain location: low back - central L4-S1 Pain: Present 0/10, Best 0/10, Worst 8/10 Pain quality: aching, dull, and stabbing Radiating pain: No  Numbness/Tingling: No 24 hour pain behavior: Pain with transfers and reaching/bending over Aggravating factors: transfers/reaching/bending Easing factors: rest in recliner, Medication How long can you sit: as long as I want How long can you stand: 10 min before pain stops me How long can you walk: unlimited  History of prior back injury, pain, surgery, or therapy: No Follow-up appointment with MD: No Dominant hand: right Imaging: Yes -X ray Falls in the last 6 months: Yes  Occupational demands: Not currently working  Hobbies: gardening; playing cards Goals: To be as painfree   OBJECTIVE     Gait Wide based gait- no device- stiff nature with decreased UE swing  Posture Lumbar lordosis: WNL Iliac crest height: equal bilaterally Lumbar lateral shift: negative   AROM (degrees) R/L (all movements include overpressure unless otherwise stated) Lumbar forward flexion: 25* Lumbar extension: 10* Lumbar lateral flexion: R: fingers to 2 above knee jt line L:  fingers approx 3 above lateral knee jt. Line*  *Indicates pain     Repeated Movements No centralization or peripheralization of symptoms with repeated lumbar extension or flexion.      Sensation intact to light touch bilateral LEs as determined by testing dermatomes L2-S2.    R Palpation Graded on 0-4 scale (0 = no pain, 1 = pain, 2 = pain with wincing/grimacing/flinching, 3 = pain with withdrawal, 4 = unwilling to allow palpation) Location LEFT  RIGHT           Lumbar paraspinals 2 2  Quadratus Lumborum 0 0  Gluteus Maximus 0 0  Gluteus Medius 0 0  Deep hip external rotators 0 0  PSIS 0 0   Fortin's Area (SIJ) 0 0  Greater Trochanter 0 0     Muscle Length Hamstrings: R: 33 degrees L: 22 degrees      Passive Accessory Intervertebral Motion (PAIVM) Pt reports some reproduction of back pain with CPA L1-L5 and UPA bilaterally L1-L5. Generally hypomobile throughout    SPECIAL TESTS Lumbar Radiculopathy and Discogenic:  Slump (SN 83, -LR 0.32): R: Negative L: Negative SLR (SN 92, -LR 0.29): R: Negative L:  Negative      SIJ:  Thigh Thrust (SN 88, -LR 0.18) : R: Negative L: Negative    Functional Tasks Lifting: limited due to pain Deep squat: not tested Sit to stand: slow and guarded  6 Min Walk Test:  Instructed patient to ambulate as quickly and as safely as possible for 6 minutes using LRAD. Patient was allowed to take standing rest breaks without stopping the test, but if the patient required a sitting rest break the clock would be  stopped and the test would be over.  Results: 950 feet (290 meters, Avg speed .81 m/s) using no AD with CGA. Results indicate that the patient has reduced endurance with ambulation compared to age matched norms.  Age Matched Norms: 87-69 yo M: 32 F: 36, 91-79 yo M: 1 F: 471, 15-89 yo M: 417 F: 392 MDC: 58.21 meters (190.98 feet) or 50 meters (ANPTA Core Set of Outcome Measures for Adults with Neurologic Conditions, 2018)                                                                                                                              TREATMENT DATE: 02/14/2024  TA:  Sit to stand with abdominal bracing and No UE Support x 15 reps Wall slides with abdominal bracing with ball on wall x 15  NMR:  Dynamic high knee marching x 20 reps without UE Support.  Static tandem standing - hold 45 sec on airex pad x 2 each LE Walking in hallway with horizontal head turning x 80 feet x 2 Static stand- back foot on green pad and front foot on airex pad with horizontal head turns x 15  (unsteady with feet staggered)   The  KoreBalance System uses interactive technology to assess and train balance and neurosensory function. It promotes stability, vestibular cue utilization, coordination, and posture, and can incorporate cognitive training. Patients stand on an adjustable unstable platform. Elements such as activity duration, use of upper extremity support, foot position, and vision (e.g., eyes open or closed) can be modified to adjust difficulty.    -The patient participated in the [facility] Balance game Memory at stability level 6.0. The activity consisted of 3 rounds 90 seconds, with the "ignore balance" feature on. Memory is a dynamic balance training game designed to improve postural control, weight shifting, and cognitive memory function. The game displays a series of images on a grid that the patient must remember and then correctly tap in sequence by shifting their center of mass while maintaining balance. For this session, the grid parameters were set to [x]  horizontal bars and [y] vertical bars to tailor the challenge level accordingly.       PATIENT EDUCATION:    Education details: Exercise technique for HEP  Person educated: Patient and Spouse Education method: Explanation, Demonstration, Tactile cues, and Verbal cues Education comprehension: verbalized understanding, returned demonstration, verbal cues required, tactile cues required, and needs further education  HOME EXERCISE PROGRAM: Access Code: [email or web address] URL: [email or web address] Date: 01/19/2024 Prepared by: Reyes London  Exercises - Seated Hamstring Stretch  - 1 x daily - 3 sets - 30 sec hold - Seated Piriformis Stretch with Trunk Bend  - 1 x daily - 3 sets - 30 sec  hold - Seated Piriformis Stretch  - 1 x daily - 3 sets - 30 hold - Seated Lumbar Flexion Stretch  - 1 x daily - 3 sets - 30 hold - Sidelying Thoracic Lumbar Rotation  -  1 x daily - 3 sets - 30 hold - Supine Bridge  - 3 x weekly - 3 sets - 10 reps - Sit to Stand  with Arms Crossed  - 3 x weekly - 3 sets - 10 reps       Access Code: [email or web address] URL: [email or web address] Date: 12/29/2023 Prepared by: Reyes London  Exercises - Supine Single Knee to Chest Stretch  - 1 x daily - 3 sets - 30 hold - Supine Lower Trunk Rotation  - 1 x daily - 3 sets - 30 sec  hold - Seated Hamstring Stretch  - 1 x daily - 3 sets - 30 hold - Supine Piriformis Stretch with Leg Straight  - 1 x daily - 3 sets - 30 hold - Seated 3 Way Exercise Ball Roll Out Stretch  - 1 x daily - 3 sets - 10 reps - Supine Bridge  - 3 x weekly - 3 sets - 10 reps    12/27/2023:Added seated hamstring, Lower trunk rotation, and knee to chest- will update and issue HEP next session. EVAL: Added Tandem standing and SLS- will need to issue handout next visit along with any Low back stretches  GOALS: Goals reviewed with patient? Yes  SHORT TERM GOALS: Target date: 02/02/2024  Pt will be independent with HEP in order to improve strength and balance in order to decrease fall risk and improve function at home. Baseline: EVAL- No formal HEP for balance; 02/09/2024= Patient and wife report independence but still working through breathing when working out.  Goal status: MET  LONG TERM GOALS: Target date: 03/15/2024  1.  Patient will complete five times sit to stand test in < 15 seconds indicating an increased LE strength and improved balance. Baseline: EVAL= 23.62 sec; 02/09/2024= 18.54 sec without UE SUpport Goal status: PROGRESSING  2.  Patient will improve ABC score to >70%  to demonstrate statistically significant improvement in mobility and quality of life as it relates to their confidence in balance.  Baseline: EVAL= 58.13%; 02/09/2024= 51.25% Goal status: ONGOING   3.  Patient will increase Berg Balance score by > 6 points to demonstrate decreased fall risk during functional activities. Baseline: EVAL= 43/56 Goal status: INITIAL   4.   Patient will reduce timed up and go  to <11 seconds to reduce fall risk and demonstrate improved transfer/gait ability. Baseline: EVAL= 23.34 sec without AD; 02/09/2024= 15.2 sec without AD Goal status: PROGRESSING  5.   Patient will increase 10 meter walk test to >1.93m/s as to improve gait speed for better community ambulation and to reduce fall risk. Baseline: EVAL=0.81 m/s without AD; 02/09/2024= 0.79 m/s avg without AD Goal status: Ongoing  6.   Patient will increase six minute walk test distance to >1000 for progression to community ambulator and improve gait ability Baseline: EVAL= To be assessed next visit; 12/27/2023= 950 feet without AD; 02/09/2024= 1145 feet without an AD Goal status: INITIAL  7. Pt will decrease mODI score by at least 13 points in order demonstrate clinically significant reduction in back pain/disability.   Baseline: will issue next visit; 12/29/2023= 42%  Goal status: NEW  8. Pt will decrease worst back pain as reported on NPRS by at least 2 points in order to demonstrate clinically significant reduction in back pain.   Baseline: 12/27/2023= 8/10 worst low back pain; 02/09/2024 = 6/10 LBP  Goal status: NEW 9. Patient will demonstrate 50% improved lumbar AROM for improved transfers/bed mobility, and standing posture.   Baseline: 12/27/2023- very  restricted ROM (see table above)   Goal status: NEW  Pt will improve FGA by at least 3 points in order to demonstrate clinically significant improvement in balance and decreased risk for falls.  Baseline: 01/12/2024= 15  Goal Status: NEW   ASSESSMENT:  CLINICAL IMPRESSION: Patient is a 73 y.o. male who was seen today for physical therapy treatment for chronic back pain. Treatment continued with some core stabilization and dynamic balance. He was challenged with all dynamic balance activities yet response to unlevel surfaces- able to adapt without back pain and performed well overall today- able to follow cues with all activities for safe session. He was really  challenged at end of session with Cognitive dual task game using Kore balance tool and will benefit from continued practice next session.  Patient will benefit from skilled PT services to address his low back symptoms, improve balance, and decrease risk for future falls.   OBJECTIVE IMPAIRMENTS: Abnormal gait, decreased activity tolerance, decreased balance, decreased cognition, decreased coordination, decreased endurance, decreased mobility, difficulty walking, decreased strength, and pain.   ACTIVITY LIMITATIONS: carrying, lifting, bending, sitting, standing, squatting, stairs, and transfers  PARTICIPATION LIMITATIONS: meal prep, cleaning, laundry, shopping, community activity, and yard work  PERSONAL FACTORS: 1-2 comorbidities: anoxic brain injury; depression are also affecting patient's functional outcome.   REHAB POTENTIAL: Good  CLINICAL DECISION MAKING: Evolving/moderate complexity  EVALUATION COMPLEXITY: Moderate  PLAN:  PT FREQUENCY: 1-2x/week  PT DURATION: 12 weeks  PLANNED INTERVENTIONS: [id number]- PT Re-evaluation, 97750- Physical Performance Testing, 97110-Therapeutic exercises, 97530- Therapeutic activity, [id number]- Neuromuscular re-education, 97535- Self Care, [id number]- Manual therapy, [id number]- Gait training, [phone]- Orthotic Initial, [phone]- Orthotic/Prosthetic subsequent, [phone]- Canalith repositioning, Patient/Family education, Balance training, Stair training, Taping, Joint mobilization, Joint manipulation, Spinal manipulation, Spinal mobilization, Compression bandaging, Vestibular training, DME instructions, Cryotherapy, and Moist heat  PLAN FOR NEXT SESSION:  KORE BALANCE For dual task Review and progress HEP Continue to progress balance  Continue to progress core/low back strengthening Cog dual task with dynamic balance    Reyes LOISE London, PT 02/14/2024, 11:30 AM

## 2024-02-16 ENCOUNTER — Ambulatory Visit

## 2024-02-16 DIAGNOSIS — G8929 Other chronic pain: Secondary | ICD-10-CM | POA: Diagnosis not present

## 2024-02-16 DIAGNOSIS — R269 Unspecified abnormalities of gait and mobility: Secondary | ICD-10-CM

## 2024-02-16 DIAGNOSIS — R2681 Unsteadiness on feet: Secondary | ICD-10-CM

## 2024-02-16 DIAGNOSIS — R2689 Other abnormalities of gait and mobility: Secondary | ICD-10-CM

## 2024-02-16 DIAGNOSIS — R262 Difficulty in walking, not elsewhere classified: Secondary | ICD-10-CM

## 2024-02-16 DIAGNOSIS — R296 Repeated falls: Secondary | ICD-10-CM | POA: Diagnosis not present

## 2024-02-16 DIAGNOSIS — R278 Other lack of coordination: Secondary | ICD-10-CM | POA: Diagnosis not present

## 2024-02-16 DIAGNOSIS — M5442 Lumbago with sciatica, left side: Secondary | ICD-10-CM | POA: Diagnosis not present

## 2024-02-16 DIAGNOSIS — M6281 Muscle weakness (generalized): Secondary | ICD-10-CM

## 2024-02-16 NOTE — Therapy (Signed)
 OUTPATIENT PHYSICAL THERAPY NEURO AND LOW BACK TREATMENT   Patient Name: Larry [name] MRN: [id number] DOB:[birth year], 73 y.o., male Today's Date: 02/16/2024   PCP: Dr. Arlyss Solian REFERRING PROVIDER: Dr. Arlyss Solian  END OF SESSION:  PT End of Session - 02/16/24 0936     Visit Number 12    Number of Visits 24    Date for PT Re-Evaluation 03/15/24    Progress Note Due on Visit 20    PT Start Time 0932    PT Stop Time 1014    PT Time Calculation (min) 42 min    Equipment Utilized During Treatment Gait belt    Activity Tolerance Patient tolerated treatment well    Behavior During Therapy WFL for tasks assessed/performed                Past Medical History:  Diagnosis Date   ADD (attention deficit disorder)    Allergy    seasonal flares of perinneal allergies   Cancer (HCC)    melanoma   Cardiac arrest (HCC) 07/1997   aborted   Dementia (HCC)    post resusitative   Depression    Diverticulosis    Family hx of prostate cancer    GERD (gastroesophageal reflux disease)    Headache(784.0)    Hyperlipidemia    Hypothyroidism    ICD (implantable cardiac defibrillator) in place    Idiopathic urticaria    Internal hemorrhoids    Low back pain syndrome    Melanoma in situ of skin of trunk (HCC)    chest   PVC (premature ventricular contraction)    associated with cardiac arrest   Sleep apnea    no c-pap   Tubular adenoma of colon 10/2010   Past Surgical History:  Procedure Laterality Date    implantation x2     CARDIAC DEFIBRILLATOR PLACEMENT  07/01/2010   Explanation of a previously implanted device, pocket revision, and insertion of a new device, and intraoperative defibrillation threshold testing Robyne)   CARDIAC DEFIBRILLATOR REMOVAL  07/05/2002   Defibrillator change Robyne) 3 times changed   CATARACT EXTRACTION W/PHACO Left 02/04/2021   Procedure: CATARACT EXTRACTION PHACO AND INTRAOCULAR LENS PLACEMENT (IOC) LEFT VIVITY toric LENS 9.21  01:11.6;  Surgeon: Mittie Gaskin, MD;  Location: [facility];  Service: Ophthalmology;  Laterality: Left;  keep patient at 11:30 arrival   CATARACT EXTRACTION W/PHACO Right 03/04/2021   Procedure: CATARACT EXTRACTION PHACO AND INTRAOCULAR LENS PLACEMENT (IOC) RIGHT VIVITY LENS 8.59 01:15.3;  Surgeon: Mittie Gaskin, MD;  Location: [facility];  Service: Ophthalmology;  Laterality: Right;   COLONOSCOPY     HEMORRHOID SURGERY     ICD     TONSILLECTOMY     Patient Active Problem List   Diagnosis Date Noted   Back pain 11/13/2023   Cervical dystonia 06/15/2022   Neck pain 07/02/2021   Gait abnormality 12/16/2020   Nonintractable headache 09/30/2020   Tinnitus 09/09/2019   History of sudden cardiac arrest 01/09/2019   Viral syndrome 09/06/2018   Seizures (HCC) 03/02/2018   Syncope 12/26/2017   Other social stressor 12/26/2017   Health care maintenance 08/14/2017   Family hx of prostate cancer    Allergy    Insomnia 10/20/2015   Advance care planning 03/08/2014   Memory loss 09/14/2013   Hypoxic brain injury (HCC) 09/14/2013   Medicare annual wellness visit, subsequent 02/16/2013   Shortness of breath 08/10/2011   Implantable cardioverter-defibrillator (ICD) in situ 02/03/2009   Hypothyroidism 11/18/2008  CONTACT DERMATITIS&OTHER ECZEMA DUE TO PLANTS 02/03/2008   IDIOPATHIC URTICARIA 02/03/2008   Hypertension 02/03/2008   HLD (hyperlipidemia) 2007-10-29   Attention deficit disorder October 29, 2007   PVC/VT non sustained Oct 29, 2007   Sleep apnea 2007-10-29   HEADACHE 10-29-2007   SUDDEN DEATH-aborted 10-29-07   LOW BACK PAIN SYNDROME 10/11/2007    ONSET DATE: worse since April 2025  REFERRING DIAG:  Z91.81 (ICD-10-CM) - History of fall  R26.9 (ICD-10-CM) - Gait abnormality  M54.9 (ICD-10-CM) - Back pain, unspecified back location, unspecified back pain laterality, unspecified chronicity    THERAPY DIAG:  Muscle weakness  (generalized)  Difficulty in walking, not elsewhere classified  Abnormality of gait and mobility  Other lack of coordination  Unsteadiness on feet  Chronic midline low back pain with left-sided sciatica  Other abnormalities of gait and mobility  Rationale for Evaluation and Treatment: Rehabilitation  SUBJECTIVE:                                                                                                                                                                                             SUBJECTIVE STATEMENT: Patient reports having a good weekend and no falls. States trying to be compliant with HEP.      Pt accompanied by: significant other  PERTINENT HISTORY: Patient reports fall in April with subsequent report of low back. PMH includes: Anoxic brain injury in 1999 due to prolonged cardiac arrest, with residual mild cognitive impairment- Slow worsening over the years. He was most recently seen here in outpatient PT clinic for chronic neck pain with good results - now here with order for abnormal gait and low back pain.   PAIN:  Are you having pain? Yes: NPRS scale: current=2 /10; worst 8/10; best= 0/10 Pain location: Low back; Neck pain Pain description: Sharp with movement Aggravating factors: Transfers, bending, prolonged sitting/standing, any twisting; lifting Relieving factors: pain meds, rest, heat  PRECAUTIONS: Fall  RED FLAGS: None   WEIGHT BEARING RESTRICTIONS: No  FALLS: Has patient fallen in last 6 months? Yes. Number of falls 3  LIVING ENVIRONMENT: Lives with: lives with their spouse Lives in: House/apartment Stairs: No Has following equipment at home: None  PLOF: Independent  PATIENT GOALS: Patient reports wanting to feel better and have less pain and not fall  OBJECTIVE:  Note: Objective measures were completed at Evaluation unless otherwise noted.  DIAGNOSTIC FINDINGS: None recent  COGNITION: Overall cognitive status: History of  cognitive impairments - at baseline   SENSATION: Light touch: WFL  COORDINATION: Delay reaction time with heel to shin and several other tasks during balance testing today.   EDEMA:  None  observed  POSTURE: rounded shoulders, forward head, and stiffness c-spine  LOWER EXTREMITY ROM:     Active  Right Eval Left Eval  Hip flexion    Hip extension    Hip abduction    Hip adduction    Hip internal rotation    Hip external rotation    Knee flexion    Knee extension    Ankle dorsiflexion    Ankle plantarflexion    Ankle inversion    Ankle eversion     (Blank rows = not tested)  LOWER EXTREMITY MMT:    MMT Right Eval Left Eval  Hip flexion 4 4-  Hip extension    Hip abduction 4 4-  Hip adduction    Hip internal rotation 4 4-  Hip external rotation 4 4-  Knee flexion 4 4-  Knee extension 4 4-  Ankle dorsiflexion 4 4  Ankle plantarflexion 4 4  Ankle inversion 4 4  Ankle eversion 4 4  (Blank rows = not tested)  BED MOBILITY:  Findings: Sit to supine Complete Independence Supine to sit Complete Independence Rolling to Right Complete Independence Rolling to Left Complete Independence  TRANSFERS: Sit to stand: SBA  Assistive device utilized: None     Stand to sit: SBA  Assistive device utilized: None      RAMP:  Not tested  CURB:  Not tested  STAIRS: Findings: Number of Stairs: 4, Height of Stairs: 6   , and Comments: use of railings GAIT: Findings: Gait Characteristics: decreased arm swing- Right, decreased arm swing- Left, decreased step length- Right, decreased step length- Left, decreased stance time- Right, decreased stance time- Left, decreased stride length, and shuffling, Distance walked: <100 feet, Assistive device utilized:None, Level of assistance: SBA, and Comments: shuffling- unsteady- stiff with minimal UE sway and stiff neck/posture  FUNCTIONAL TESTS:   5 times sit to stand: 23.62 sec without UE Support *pain LBP Timed up and go (TUG):  23.12 and 23.56 sec without AD= 23.34 sec avg 6 minute walk test: to be assessed next 1-2 visits 10 meter walk test: 12.36 sec= 0.81 m/s Berg Balance Scale:  Item Test date: 12/22/2023 Test date:  Test date:   Sitting to standing 4. able to stand without using hands and stabilize independently Insert OPRCBERGREEVAL SmartPhrase at re-test date Insert OPRCBERGREEVAL SmartPhrase at re-test date  2. Standing unsupported 4. able to stand safely for 2 minutes    3. Sitting with back unsupported, feet supported 4. able to sit safely and securely for 2 minutes    4. Standing to sitting 4. sits safely with minimal use of hands    5. Pivot transfer  4. able to transfer safely with minor use of hands    6. Standing unsupported with eyes closed 3. able to stand 10 seconds with supervision    7. Standing unsupported with feet together 3. able to place feet together independently and stand 1 minute with supervision    8. Reaching forward with outstretched arms while standing 3. can reach forward 12 cm (5 inches)    9. Pick up object from the floor from standing 3. able to pick up slipper but needs supervision    10. Turning to look behind over left and right shoulders while standing 2. turns sideways but only maintains balance    11. Turn 360 degrees 2. able to turn 360 degrees safely but slowly    12. Place alternate foot on step or stool while standing unsupported 4. ble to stand  independently and safely and complete 8 steps in 20 seconds    13. Standing unsupported one foot in front 2. able to take small step independently and hold 30 seconds    14. Standing on one leg 1. tries to lift leg unable to hold 3 seconds but remains standing independently.      Total Score 43/56 Total Score /56 Total Score /56    Functional gait assessment: To be assessed next 1-2 visits  PATIENT SURVEYS:  ABC scale 58.13%    SUBJECTIVE Chief complaint:  Low back  History: chronic but worse after fall earlier this year.   Red flags None of the following: (bowel/bladder changes, saddle paresthesia, personal history of cancer, chills/fever, night sweats, unrelenting pain, first onset of insidious LBP <20 y/o) Negative  Pain location: low back - central L4-S1 Pain: Present 0/10, Best 0/10, Worst 8/10 Pain quality: aching, dull, and stabbing Radiating pain: No  Numbness/Tingling: No 24 hour pain behavior: Pain with transfers and reaching/bending over Aggravating factors: transfers/reaching/bending Easing factors: rest in recliner, Medication How long can you sit: as long as I want How long can you stand: 10 min before pain stops me How long can you walk: unlimited  History of prior back injury, pain, surgery, or therapy: No Follow-up appointment with MD: No Dominant hand: right Imaging: Yes -X ray Falls in the last 6 months: Yes  Occupational demands: Not currently working  Hobbies: gardening; playing cards Goals: To be as painfree   OBJECTIVE     Gait Wide based gait- no device- stiff nature with decreased UE swing  Posture Lumbar lordosis: WNL Iliac crest height: equal bilaterally Lumbar lateral shift: negative   AROM (degrees) R/L (all movements include overpressure unless otherwise stated) Lumbar forward flexion: 25* Lumbar extension: 10* Lumbar lateral flexion: R: fingers to 2 above knee jt line L:  fingers approx 3 above lateral knee jt. Line*  *Indicates pain     Repeated Movements No centralization or peripheralization of symptoms with repeated lumbar extension or flexion.      Sensation intact to light touch bilateral LEs as determined by testing dermatomes L2-S2.    R Palpation Graded on 0-4 scale (0 = no pain, 1 = pain, 2 = pain with wincing/grimacing/flinching, 3 = pain with withdrawal, 4 = unwilling to allow palpation) Location LEFT  RIGHT           Lumbar paraspinals 2 2  Quadratus Lumborum 0 0  Gluteus Maximus 0 0  Gluteus Medius 0 0  Deep hip external  rotators 0 0  PSIS 0 0  Fortin's Area (SIJ) 0 0  Greater Trochanter 0 0     Muscle Length Hamstrings: R: 33 degrees L: 22 degrees      Passive Accessory Intervertebral Motion (PAIVM) Pt reports some reproduction of back pain with CPA L1-L5 and UPA bilaterally L1-L5. Generally hypomobile throughout    SPECIAL TESTS Lumbar Radiculopathy and Discogenic:  Slump (SN 83, -LR 0.32): R: Negative L: Negative SLR (SN 92, -LR 0.29): R: Negative L:  Negative      SIJ:  Thigh Thrust (SN 88, -LR 0.18) : R: Negative L: Negative    Functional Tasks Lifting: limited due to pain Deep squat: not tested Sit to stand: slow and guarded  6 Min Walk Test:  Instructed patient to ambulate as quickly and as safely as possible for 6 minutes using LRAD. Patient was allowed to take standing rest breaks without stopping the test, but if the patient required a  sitting rest break the clock would be stopped and the test would be over.  Results: 950 feet (290 meters, Avg speed .81 m/s) using no AD with CGA. Results indicate that the patient has reduced endurance with ambulation compared to age matched norms.  Age Matched Norms: 69-69 yo M: 3 F: 54, 67-79 yo M: 110 F: 471, 54-89 yo M: 417 F: 392 MDC: 58.21 meters (190.98 feet) or 50 meters (ANPTA Core Set of Outcome Measures for Adults with Neurologic Conditions, 2018)                                                                                                                              TREATMENT DATE: 02/16/2024    TA:  Sit to stand with abdominal bracing and No UE Support x 15 reps  Fwd step ups onto 6 block x 20 reps   NMR:  Dynamic high knee marching x 20 reps without UE Support.  Static tandem standing - hold 45 sec  Static stand- back foot on floor and front foot on 6 step with horizontal head turns x 15  (unsteady with feet staggered)   The KoreBalance System uses interactive technology to assess and train balance and neurosensory  function. It promotes stability, vestibular cue utilization, coordination, and posture, and can incorporate cognitive training. Patients stand on an adjustable unstable platform. Elements such as activity duration, use of upper extremity support, foot position, and vision (e.g., eyes open or closed) can be modified to adjust difficulty.    -The patient participated in the [facility] Balance game Memory at stability level 6.0. The activity consisted of 3 rounds 90 seconds, with the "ignore balance" feature on. He progressed well with no LOB so then lowered the stability level to 3.0 for 4 rds of 90 sec activity with scores ranging from 4-6 matches. Memory is a dynamic balance training game designed to improve postural control, weight shifting, and cognitive memory function. The game displays a series of images on a grid that the patient must remember and then correctly tap in sequence by shifting their center of mass while maintaining balance. For this session, the grid parameters were set to [x]  horizontal bars and [y] vertical bars to tailor the challenge level accordingly.       PATIENT EDUCATION:    Education details: Exercise technique for HEP  Person educated: Patient and Spouse Education method: Explanation, Demonstration, Tactile cues, and Verbal cues Education comprehension: verbalized understanding, returned demonstration, verbal cues required, tactile cues required, and needs further education  HOME EXERCISE PROGRAM: Access Code: [email or web address] URL: [email or web address] Date: 01/19/2024 Prepared by: Reyes London  Exercises - Seated Hamstring Stretch  - 1 x daily - 3 sets - 30 sec hold - Seated Piriformis Stretch with Trunk Bend  - 1 x daily - 3 sets - 30 sec  hold - Seated Piriformis Stretch  - 1 x daily - 3 sets - 30 hold - Seated Lumbar Flexion Stretch  -  1 x daily - 3 sets - 30 hold - Sidelying Thoracic Lumbar Rotation  - 1 x daily - 3 sets - 30 hold - Supine Bridge   - 3 x weekly - 3 sets - 10 reps - Sit to Stand with Arms Crossed  - 3 x weekly - 3 sets - 10 reps       Access Code: [email or web address] URL: [email or web address] Date: 12/29/2023 Prepared by: Reyes London  Exercises - Supine Single Knee to Chest Stretch  - 1 x daily - 3 sets - 30 hold - Supine Lower Trunk Rotation  - 1 x daily - 3 sets - 30 sec  hold - Seated Hamstring Stretch  - 1 x daily - 3 sets - 30 hold - Supine Piriformis Stretch with Leg Straight  - 1 x daily - 3 sets - 30 hold - Seated 3 Way Exercise Ball Roll Out Stretch  - 1 x daily - 3 sets - 10 reps - Supine Bridge  - 3 x weekly - 3 sets - 10 reps    12/27/2023:Added seated hamstring, Lower trunk rotation, and knee to chest- will update and issue HEP next session. EVAL: Added Tandem standing and SLS- will need to issue handout next visit along with any Low back stretches  GOALS: Goals reviewed with patient? Yes  SHORT TERM GOALS: Target date: 02/02/2024  Pt will be independent with HEP in order to improve strength and balance in order to decrease fall risk and improve function at home. Baseline: EVAL- No formal HEP for balance; 02/09/2024= Patient and wife report independence but still working through breathing when working out.  Goal status: MET  LONG TERM GOALS: Target date: 03/15/2024  1.  Patient will complete five times sit to stand test in < 15 seconds indicating an increased LE strength and improved balance. Baseline: EVAL= 23.62 sec; 02/09/2024= 18.54 sec without UE SUpport Goal status: PROGRESSING  2.  Patient will improve ABC score to >70%  to demonstrate statistically significant improvement in mobility and quality of life as it relates to their confidence in balance.  Baseline: EVAL= 58.13%; 02/09/2024= 51.25% Goal status: ONGOING   3.  Patient will increase Berg Balance score by > 6 points to demonstrate decreased fall risk during functional activities. Baseline: EVAL= 43/56 Goal status:  INITIAL   4.   Patient will reduce timed up and go to <11 seconds to reduce fall risk and demonstrate improved transfer/gait ability. Baseline: EVAL= 23.34 sec without AD; 02/09/2024= 15.2 sec without AD Goal status: PROGRESSING  5.   Patient will increase 10 meter walk test to >1.10m/s as to improve gait speed for better community ambulation and to reduce fall risk. Baseline: EVAL=0.81 m/s without AD; 02/09/2024= 0.79 m/s avg without AD Goal status: Ongoing  6.   Patient will increase six minute walk test distance to >1000 for progression to community ambulator and improve gait ability Baseline: EVAL= To be assessed next visit; 12/27/2023= 950 feet without AD; 02/09/2024= 1145 feet without an AD Goal status: INITIAL  7. Pt will decrease mODI score by at least 13 points in order demonstrate clinically significant reduction in back pain/disability.   Baseline: will issue next visit; 12/29/2023= 42%  Goal status: NEW  8. Pt will decrease worst back pain as reported on NPRS by at least 2 points in order to demonstrate clinically significant reduction in back pain.   Baseline: 12/27/2023= 8/10 worst low back pain; 02/09/2024 = 6/10 LBP  Goal status: NEW 9. Patient will demonstrate  50% improved lumbar AROM for improved transfers/bed mobility, and standing posture.   Baseline: 12/27/2023- very restricted ROM (see table above)   Goal status: NEW  Pt will improve FGA by at least 3 points in order to demonstrate clinically significant improvement in balance and decreased risk for falls.  Baseline: 01/12/2024= 15  Goal Status: NEW   ASSESSMENT:  CLINICAL IMPRESSION: Patient is a 73 y.o. male who was seen today for physical therapy treatment for chronic back pain. Treatment again focused on cognitive dual task activities including playing games - memory and aplabet- he was challenged more with cognitive task than balance so reduced level of stability to add to level of difficulty and patient responded  well- some unsteadiness but no LOB.  Patient will benefit from skilled PT services to address his low back symptoms, improve balance, and decrease risk for future falls.   OBJECTIVE IMPAIRMENTS: Abnormal gait, decreased activity tolerance, decreased balance, decreased cognition, decreased coordination, decreased endurance, decreased mobility, difficulty walking, decreased strength, and pain.   ACTIVITY LIMITATIONS: carrying, lifting, bending, sitting, standing, squatting, stairs, and transfers  PARTICIPATION LIMITATIONS: meal prep, cleaning, laundry, shopping, community activity, and yard work  PERSONAL FACTORS: 1-2 comorbidities: anoxic brain injury; depression are also affecting patient's functional outcome.   REHAB POTENTIAL: Good  CLINICAL DECISION MAKING: Evolving/moderate complexity  EVALUATION COMPLEXITY: Moderate  PLAN:  PT FREQUENCY: 1-2x/week  PT DURATION: 12 weeks  PLANNED INTERVENTIONS: 97164- PT Re-evaluation, 97750- Physical Performance Testing, 97110-Therapeutic exercises, 97530- Therapeutic activity, [id number]- Neuromuscular re-education, 97535- Self Care, 02859- Manual therapy, [id number]- Gait training, [phone]- Orthotic Initial, [phone]- Orthotic/Prosthetic subsequent, [phone]- Canalith repositioning, Patient/Family education, Balance training, Stair training, Taping, Joint mobilization, Joint manipulation, Spinal manipulation, Spinal mobilization, Compression bandaging, Vestibular training, DME instructions, Cryotherapy, and Moist heat  PLAN FOR NEXT SESSION:  KORE BALANCE For dual task Review and progress HEP Continue to progress balance  Continue to progress core/low back strengthening Cog dual task with dynamic balance    Reyes LOISE London, PT 02/16/2024, 10:37 AM

## 2024-02-20 ENCOUNTER — Ambulatory Visit

## 2024-02-21 ENCOUNTER — Ambulatory Visit

## 2024-02-22 ENCOUNTER — Other Ambulatory Visit: Payer: Self-pay | Admitting: Family Medicine

## 2024-02-22 NOTE — Telephone Encounter (Signed)
 Sent. Thanks.

## 2024-02-22 NOTE — Telephone Encounter (Signed)
 Plz associate dx code to rx.  Name of Medication:  Adderall Name of Pharmacy:  [facility] Last Fill or Written Date and Quantity:  11/13/23, #180 Last Office Visit and Type:  11/11/23, annual exam Next Office Visit and Type:  none Last Controlled Substance Agreement Date:  04/18/15 Last UDS:  10/17/15

## 2024-02-22 NOTE — Telephone Encounter (Signed)
Sent via other request.

## 2024-02-22 NOTE — Telephone Encounter (Signed)
 Copied from CRM #[id number]. Topic: Clinical - Medication Refill >> Feb 22, 2024  9:18 AM [place] E wrote: Medication: amphetamine -dextroamphetamine  (ADDERALL) 10 MG tablet  Has the patient contacted their pharmacy? Yes (Agent: If no, request that the patient contact the pharmacy for the refill. If patient does not wish to contact the pharmacy document the reason why and proceed with request.) (Agent: If yes, when and what did the pharmacy advise?)  This is the patient's preferred pharmacy:   [facility] [address] [facility] - [place], [place] - [address] [facility] [address] [facility] [address] [place] [address] Phone: [phone] Fax: [phone]  Is this the correct pharmacy for this prescription? Yes If no, delete pharmacy and type the correct one.   Has the prescription been filled recently? No  Is the patient out of the medication? No,1 week left.  Has the patient been seen for an appointment in the last year OR does the patient have an upcoming appointment? Yes  Can we respond through MyChart? Yes  Agent: Please be advised that Rx refills may take up to 3 business days. We ask that you follow-up with your pharmacy.

## 2024-02-23 ENCOUNTER — Ambulatory Visit

## 2024-02-23 DIAGNOSIS — R2689 Other abnormalities of gait and mobility: Secondary | ICD-10-CM | POA: Diagnosis not present

## 2024-02-23 DIAGNOSIS — R269 Unspecified abnormalities of gait and mobility: Secondary | ICD-10-CM | POA: Diagnosis not present

## 2024-02-23 DIAGNOSIS — R2681 Unsteadiness on feet: Secondary | ICD-10-CM | POA: Diagnosis not present

## 2024-02-23 DIAGNOSIS — M5442 Lumbago with sciatica, left side: Secondary | ICD-10-CM | POA: Diagnosis not present

## 2024-02-23 DIAGNOSIS — M6281 Muscle weakness (generalized): Secondary | ICD-10-CM | POA: Diagnosis not present

## 2024-02-23 DIAGNOSIS — R262 Difficulty in walking, not elsewhere classified: Secondary | ICD-10-CM

## 2024-02-23 DIAGNOSIS — R278 Other lack of coordination: Secondary | ICD-10-CM | POA: Diagnosis not present

## 2024-02-23 DIAGNOSIS — G8929 Other chronic pain: Secondary | ICD-10-CM | POA: Diagnosis not present

## 2024-02-23 DIAGNOSIS — R296 Repeated falls: Secondary | ICD-10-CM | POA: Diagnosis not present

## 2024-02-23 NOTE — Therapy (Signed)
 OUTPATIENT PHYSICAL THERAPY NEURO AND LOW BACK TREATMENT   Patient Name: Larry [name] MRN: [id number] DOB:[birth year], 73 y.o., male Today's Date: 02/23/2024   PCP: Dr. Arlyss Solian REFERRING PROVIDER: Dr. Arlyss Solian  END OF SESSION:  PT End of Session - 02/23/24 0936     Visit Number 13    Number of Visits 24    Date for PT Re-Evaluation 03/15/24    Progress Note Due on Visit 20    PT Start Time 0933    PT Stop Time 1014    PT Time Calculation (min) 41 min    Equipment Utilized During Treatment Gait belt    Activity Tolerance Patient tolerated treatment well    Behavior During Therapy WFL for tasks assessed/performed                 Past Medical History:  Diagnosis Date   ADD (attention deficit disorder)    Allergy    seasonal flares of perinneal allergies   Cancer (HCC)    melanoma   Cardiac arrest (HCC) 07/1997   aborted   Dementia (HCC)    post resusitative   Depression    Diverticulosis    Family hx of prostate cancer    GERD (gastroesophageal reflux disease)    Headache(784.0)    Hyperlipidemia    Hypothyroidism    ICD (implantable cardiac defibrillator) in place    Idiopathic urticaria    Internal hemorrhoids    Low back pain syndrome    Melanoma in situ of skin of trunk (HCC)    chest   PVC (premature ventricular contraction)    associated with cardiac arrest   Sleep apnea    no c-pap   Tubular adenoma of colon 10/2010   Past Surgical History:  Procedure Laterality Date    implantation x2     CARDIAC DEFIBRILLATOR PLACEMENT  07/01/2010   Explanation of a previously implanted device, pocket revision, and insertion of a new device, and intraoperative defibrillation threshold testing Robyne)   CARDIAC DEFIBRILLATOR REMOVAL  07/05/2002   Defibrillator change Robyne) 3 times changed   CATARACT EXTRACTION W/PHACO Left 02/04/2021   Procedure: CATARACT EXTRACTION PHACO AND INTRAOCULAR LENS PLACEMENT (IOC) LEFT VIVITY toric LENS 9.21  01:11.6;  Surgeon: Mittie Gaskin, MD;  Location: [facility];  Service: Ophthalmology;  Laterality: Left;  keep patient at 11:30 arrival   CATARACT EXTRACTION W/PHACO Right 03/04/2021   Procedure: CATARACT EXTRACTION PHACO AND INTRAOCULAR LENS PLACEMENT (IOC) RIGHT VIVITY LENS 8.59 01:15.3;  Surgeon: Mittie Gaskin, MD;  Location: [facility];  Service: Ophthalmology;  Laterality: Right;   COLONOSCOPY     HEMORRHOID SURGERY     ICD     TONSILLECTOMY     Patient Active Problem List   Diagnosis Date Noted   Back pain 11/13/2023   Cervical dystonia 06/15/2022   Neck pain 07/02/2021   Gait abnormality 12/16/2020   Nonintractable headache 09/30/2020   Tinnitus 09/09/2019   History of sudden cardiac arrest 01/09/2019   Viral syndrome 09/06/2018   Seizures (HCC) 03/02/2018   Syncope 12/26/2017   Other social stressor 12/26/2017   Health care maintenance 08/14/2017   Family hx of prostate cancer    Allergy    Insomnia 10/20/2015   Advance care planning 03/08/2014   Memory loss 09/14/2013   Hypoxic brain injury (HCC) 09/14/2013   Medicare annual wellness visit, subsequent 02/16/2013   Shortness of breath 08/10/2011   Implantable cardioverter-defibrillator (ICD) in situ 02/03/2009   Hypothyroidism 11/18/2008  CONTACT DERMATITIS&OTHER ECZEMA DUE TO PLANTS 02/03/2008   IDIOPATHIC URTICARIA 02/03/2008   Hypertension 02/03/2008   HLD (hyperlipidemia) October 29, 2007   Attention deficit disorder 2007-10-29   PVC/VT non sustained 2007-10-29   Sleep apnea 10-29-2007   HEADACHE 10/29/2007   SUDDEN DEATH-aborted October 29, 2007   LOW BACK PAIN SYNDROME 10/11/2007    ONSET DATE: worse since April 2025  REFERRING DIAG:  Z91.81 (ICD-10-CM) - History of fall  R26.9 (ICD-10-CM) - Gait abnormality  M54.9 (ICD-10-CM) - Back pain, unspecified back location, unspecified back pain laterality, unspecified chronicity    THERAPY DIAG:  Muscle weakness  (generalized)  Difficulty in walking, not elsewhere classified  Rationale for Evaluation and Treatment: Rehabilitation  SUBJECTIVE:                                                                                                                                                                                             SUBJECTIVE STATEMENT: Patient reports doing better overall- states already did his HEP today and warmed up.      Pt accompanied by: significant other  PERTINENT HISTORY: Patient reports fall in April with subsequent report of low back. PMH includes: Anoxic brain injury in 1999 due to prolonged cardiac arrest, with residual mild cognitive impairment- Slow worsening over the years. He was most recently seen here in outpatient PT clinic for chronic neck pain with good results - now here with order for abnormal gait and low back pain.   PAIN:  Are you having pain? Yes: NPRS scale: current=2 /10; worst 8/10; best= 0/10 Pain location: Low back; Neck pain Pain description: Sharp with movement Aggravating factors: Transfers, bending, prolonged sitting/standing, any twisting; lifting Relieving factors: pain meds, rest, heat  PRECAUTIONS: Fall  RED FLAGS: None   WEIGHT BEARING RESTRICTIONS: No  FALLS: Has patient fallen in last 6 months? Yes. Number of falls 3  LIVING ENVIRONMENT: Lives with: lives with their spouse Lives in: House/apartment Stairs: No Has following equipment at home: None  PLOF: Independent  PATIENT GOALS: Patient reports wanting to feel better and have less pain and not fall  OBJECTIVE:  Note: Objective measures were completed at Evaluation unless otherwise noted.  DIAGNOSTIC FINDINGS: None recent  COGNITION: Overall cognitive status: History of cognitive impairments - at baseline   SENSATION: Light touch: WFL  COORDINATION: Delay reaction time with heel to shin and several other tasks during balance testing today.   EDEMA:  None  observed  POSTURE: rounded shoulders, forward head, and stiffness c-spine  LOWER EXTREMITY ROM:     Active  Right Eval Left Eval  Hip flexion    Hip extension  Hip abduction    Hip adduction    Hip internal rotation    Hip external rotation    Knee flexion    Knee extension    Ankle dorsiflexion    Ankle plantarflexion    Ankle inversion    Ankle eversion     (Blank rows = not tested)  LOWER EXTREMITY MMT:    MMT Right Eval Left Eval  Hip flexion 4 4-  Hip extension    Hip abduction 4 4-  Hip adduction    Hip internal rotation 4 4-  Hip external rotation 4 4-  Knee flexion 4 4-  Knee extension 4 4-  Ankle dorsiflexion 4 4  Ankle plantarflexion 4 4  Ankle inversion 4 4  Ankle eversion 4 4  (Blank rows = not tested)  BED MOBILITY:  Findings: Sit to supine Complete Independence Supine to sit Complete Independence Rolling to Right Complete Independence Rolling to Left Complete Independence  TRANSFERS: Sit to stand: SBA  Assistive device utilized: None     Stand to sit: SBA  Assistive device utilized: None      RAMP:  Not tested  CURB:  Not tested  STAIRS: Findings: Number of Stairs: 4, Height of Stairs: 6   , and Comments: use of railings GAIT: Findings: Gait Characteristics: decreased arm swing- Right, decreased arm swing- Left, decreased step length- Right, decreased step length- Left, decreased stance time- Right, decreased stance time- Left, decreased stride length, and shuffling, Distance walked: <100 feet, Assistive device utilized:None, Level of assistance: SBA, and Comments: shuffling- unsteady- stiff with minimal UE sway and stiff neck/posture  FUNCTIONAL TESTS:   5 times sit to stand: 23.62 sec without UE Support *pain LBP Timed up and go (TUG): 23.12 and 23.56 sec without AD= 23.34 sec avg 6 minute walk test: to be assessed next 1-2 visits 10 meter walk test: 12.36 sec= 0.81 m/s Berg Balance Scale:  Item Test date: 12/22/2023 Test date:   Test date:   Sitting to standing 4. able to stand without using hands and stabilize independently Insert OPRCBERGREEVAL SmartPhrase at re-test date Insert OPRCBERGREEVAL SmartPhrase at re-test date  2. Standing unsupported 4. able to stand safely for 2 minutes    3. Sitting with back unsupported, feet supported 4. able to sit safely and securely for 2 minutes    4. Standing to sitting 4. sits safely with minimal use of hands    5. Pivot transfer  4. able to transfer safely with minor use of hands    6. Standing unsupported with eyes closed 3. able to stand 10 seconds with supervision    7. Standing unsupported with feet together 3. able to place feet together independently and stand 1 minute with supervision    8. Reaching forward with outstretched arms while standing 3. can reach forward 12 cm (5 inches)    9. Pick up object from the floor from standing 3. able to pick up slipper but needs supervision    10. Turning to look behind over left and right shoulders while standing 2. turns sideways but only maintains balance    11. Turn 360 degrees 2. able to turn 360 degrees safely but slowly    12. Place alternate foot on step or stool while standing unsupported 4. ble to stand independently and safely and complete 8 steps in 20 seconds    13. Standing unsupported one foot in front 2. able to take small step independently and hold 30 seconds    14.  Standing on one leg 1. tries to lift leg unable to hold 3 seconds but remains standing independently.      Total Score 43/56 Total Score /56 Total Score /56    Functional gait assessment: To be assessed next 1-2 visits  PATIENT SURVEYS:  ABC scale 58.13%    SUBJECTIVE Chief complaint:  Low back  History: chronic but worse after fall earlier this year.  Red flags None of the following: (bowel/bladder changes, saddle paresthesia, personal history of cancer, chills/fever, night sweats, unrelenting pain, first onset of insidious LBP <20 y/o)  Negative  Pain location: low back - central L4-S1 Pain: Present 0/10, Best 0/10, Worst 8/10 Pain quality: aching, dull, and stabbing Radiating pain: No  Numbness/Tingling: No 24 hour pain behavior: Pain with transfers and reaching/bending over Aggravating factors: transfers/reaching/bending Easing factors: rest in recliner, Medication How long can you sit: as long as I want How long can you stand: 10 min before pain stops me How long can you walk: unlimited  History of prior back injury, pain, surgery, or therapy: No Follow-up appointment with MD: No Dominant hand: right Imaging: Yes -X ray Falls in the last 6 months: Yes  Occupational demands: Not currently working  Hobbies: gardening; playing cards Goals: To be as painfree   OBJECTIVE     Gait Wide based gait- no device- stiff nature with decreased UE swing  Posture Lumbar lordosis: WNL Iliac crest height: equal bilaterally Lumbar lateral shift: negative   AROM (degrees) R/L (all movements include overpressure unless otherwise stated) Lumbar forward flexion: 25* Lumbar extension: 10* Lumbar lateral flexion: R: fingers to 2 above knee jt line L:  fingers approx 3 above lateral knee jt. Line*  *Indicates pain     Repeated Movements No centralization or peripheralization of symptoms with repeated lumbar extension or flexion.      Sensation intact to light touch bilateral LEs as determined by testing dermatomes L2-S2.    R Palpation Graded on 0-4 scale (0 = no pain, 1 = pain, 2 = pain with wincing/grimacing/flinching, 3 = pain with withdrawal, 4 = unwilling to allow palpation) Location LEFT  RIGHT           Lumbar paraspinals 2 2  Quadratus Lumborum 0 0  Gluteus Maximus 0 0  Gluteus Medius 0 0  Deep hip external rotators 0 0  PSIS 0 0  Fortin's Area (SIJ) 0 0  Greater Trochanter 0 0     Muscle Length Hamstrings: R: 33 degrees L: 22 degrees      Passive Accessory Intervertebral Motion  (PAIVM) Pt reports some reproduction of back pain with CPA L1-L5 and UPA bilaterally L1-L5. Generally hypomobile throughout    SPECIAL TESTS Lumbar Radiculopathy and Discogenic:  Slump (SN 83, -LR 0.32): R: Negative L: Negative SLR (SN 92, -LR 0.29): R: Negative L:  Negative      SIJ:  Thigh Thrust (SN 88, -LR 0.18) : R: Negative L: Negative    Functional Tasks Lifting: limited due to pain Deep squat: not tested Sit to stand: slow and guarded  6 Min Walk Test:  Instructed patient to ambulate as quickly and as safely as possible for 6 minutes using LRAD. Patient was allowed to take standing rest breaks without stopping the test, but if the patient required a sitting rest break the clock would be stopped and the test would be over.  Results: 950 feet (290 meters, Avg speed .81 m/s) using no AD with CGA. Results indicate that the patient has  reduced endurance with ambulation compared to age matched norms.  Age Matched Norms: 82-69 yo M: 104 F: 64, 72-79 yo M: 38 F: 471, 75-89 yo M: 417 F: 392 MDC: 58.21 meters (190.98 feet) or 50 meters (ANPTA Core Set of Outcome Measures for Adults with Neurologic Conditions, 2018)                                                                                                                              TREATMENT DATE: 02/23/2024    TA:  Sit to stand with abdominal bracing and No UE Support x 15 reps Side stepping (large 1step to right then back to left) x 12 with 3# AW Standing hip ext with abdominal bracing at support bar 2 x 10 reps  with 3#AW Standing calf raises with abdominal bracing at support bar x 15 reps with 3#AW Fwd step ups  with abdominal bracing onto 6 block x 20 reps with 3#AW Mini Lunge Squat 3#AW- alt LE x 15 reps each.    NMR:  Dynamic high knee marching x 20 reps without UE Support.  Static tandem standing - hold 45 sec x 2 each side Fwd/bwd walk with 3# AW x 10 feet and back x 5 Side stepping with 3# x 10 feet and  back x 3 Toe walk 3# x 10 feet  x 4      PATIENT EDUCATION:    Education details: Exercise technique for HEP  Person educated: Patient and Spouse Education method: Explanation, Demonstration, Tactile cues, and Verbal cues Education comprehension: verbalized understanding, returned demonstration, verbal cues required, tactile cues required, and needs further education  HOME EXERCISE PROGRAM: Access Code: [email or web address] URL: [email or web address] Date: 01/19/2024 Prepared by: Reyes London  Exercises - Seated Hamstring Stretch  - 1 x daily - 3 sets - 30 sec hold - Seated Piriformis Stretch with Trunk Bend  - 1 x daily - 3 sets - 30 sec  hold - Seated Piriformis Stretch  - 1 x daily - 3 sets - 30 hold - Seated Lumbar Flexion Stretch  - 1 x daily - 3 sets - 30 hold - Sidelying Thoracic Lumbar Rotation  - 1 x daily - 3 sets - 30 hold - Supine Bridge  - 3 x weekly - 3 sets - 10 reps - Sit to Stand with Arms Crossed  - 3 x weekly - 3 sets - 10 reps       Access Code: [email or web address] URL: [email or web address] Date: 12/29/2023 Prepared by: Reyes London  Exercises - Supine Single Knee to Chest Stretch  - 1 x daily - 3 sets - 30 hold - Supine Lower Trunk Rotation  - 1 x daily - 3 sets - 30 sec  hold - Seated Hamstring Stretch  - 1 x daily - 3 sets - 30 hold - Supine Piriformis Stretch with Leg Straight  - 1 x daily - 3 sets - 30 hold - Seated 3 Way Exercise Erwin  Roll Out Stretch  - 1 x daily - 3 sets - 10 reps - Supine Bridge  - 3 x weekly - 3 sets - 10 reps    12/27/2023:Added seated hamstring, Lower trunk rotation, and knee to chest- will update and issue HEP next session. EVAL: Added Tandem standing and SLS- will need to issue handout next visit along with any Low back stretches  GOALS: Goals reviewed with patient? Yes  SHORT TERM GOALS: Target date: 02/02/2024  Pt will be independent with HEP in order to improve strength and balance in order  to decrease fall risk and improve function at home. Baseline: EVAL- No formal HEP for balance; 02/09/2024= Patient and wife report independence but still working through breathing when working out.  Goal status: MET  LONG TERM GOALS: Target date: 03/15/2024  1.  Patient will complete five times sit to stand test in < 15 seconds indicating an increased LE strength and improved balance. Baseline: EVAL= 23.62 sec; 02/09/2024= 18.54 sec without UE SUpport Goal status: PROGRESSING  2.  Patient will improve ABC score to >70%  to demonstrate statistically significant improvement in mobility and quality of life as it relates to their confidence in balance.  Baseline: EVAL= 58.13%; 02/09/2024= 51.25% Goal status: ONGOING   3.  Patient will increase Berg Balance score by > 6 points to demonstrate decreased fall risk during functional activities. Baseline: EVAL= 43/56 Goal status: INITIAL   4.   Patient will reduce timed up and go to <11 seconds to reduce fall risk and demonstrate improved transfer/gait ability. Baseline: EVAL= 23.34 sec without AD; 02/09/2024= 15.2 sec without AD Goal status: PROGRESSING  5.   Patient will increase 10 meter walk test to >1.105m/s as to improve gait speed for better community ambulation and to reduce fall risk. Baseline: EVAL=0.81 m/s without AD; 02/09/2024= 0.79 m/s avg without AD Goal status: Ongoing  6.   Patient will increase six minute walk test distance to >1000 for progression to community ambulator and improve gait ability Baseline: EVAL= To be assessed next visit; 12/27/2023= 950 feet without AD; 02/09/2024= 1145 feet without an AD Goal status: INITIAL  7. Pt will decrease mODI score by at least 13 points in order demonstrate clinically significant reduction in back pain/disability.   Baseline: will issue next visit; 12/29/2023= 42%  Goal status: NEW  8. Pt will decrease worst back pain as reported on NPRS by at least 2 points in order to demonstrate clinically  significant reduction in back pain.   Baseline: 12/27/2023= 8/10 worst low back pain; 02/09/2024 = 6/10 LBP  Goal status: NEW 9. Patient will demonstrate 50% improved lumbar AROM for improved transfers/bed mobility, and standing posture.   Baseline: 12/27/2023- very restricted ROM (see table above)   Goal status: NEW  Pt will improve FGA by at least 3 points in order to demonstrate clinically significant improvement in balance and decreased risk for falls.  Baseline: 01/12/2024= 15  Goal Status: NEW   ASSESSMENT:  CLINICAL IMPRESSION: Patient is a 73 y.o. male who was seen today for physical therapy treatment for chronic back pain. Patient performed well and able to demonstrate progress with core activation plus a LE strengthening activity. He was able to count and control his breathing better. He later demonstrated some progress with retro walking with increased step length with practice but largely still unsteady with difficulty coordinating task at times. Patient will benefit from skilled PT services to address his low back symptoms, improve balance, and decrease risk for  future falls.   OBJECTIVE IMPAIRMENTS: Abnormal gait, decreased activity tolerance, decreased balance, decreased cognition, decreased coordination, decreased endurance, decreased mobility, difficulty walking, decreased strength, and pain.   ACTIVITY LIMITATIONS: carrying, lifting, bending, sitting, standing, squatting, stairs, and transfers  PARTICIPATION LIMITATIONS: meal prep, cleaning, laundry, shopping, community activity, and yard work  PERSONAL FACTORS: 1-2 comorbidities: anoxic brain injury; depression are also affecting patient's functional outcome.   REHAB POTENTIAL: Good  CLINICAL DECISION MAKING: Evolving/moderate complexity  EVALUATION COMPLEXITY: Moderate  PLAN:  PT FREQUENCY: 1-2x/week  PT DURATION: 12 weeks  PLANNED INTERVENTIONS: 97164- PT Re-evaluation, 97750- Physical Performance Testing,  97110-Therapeutic exercises, 97530- Therapeutic activity, [id number]- Neuromuscular re-education, 97535- Self Care, [id number]- Manual therapy, [phone]- Gait training, [phone]- Orthotic Initial, [phone]- Orthotic/Prosthetic subsequent, [phone]- Canalith repositioning, Patient/Family education, Balance training, Stair training, Taping, Joint mobilization, Joint manipulation, Spinal manipulation, Spinal mobilization, Compression bandaging, Vestibular training, DME instructions, Cryotherapy, and Moist heat  PLAN FOR NEXT SESSION:  KORE BALANCE For dual task Review and progress HEP Continue to progress balance  Continue to progress core/low back strengthening Cog dual task with dynamic balance    Reyes LOISE London, PT 02/23/2024, 9:33 PM

## 2024-02-27 NOTE — Therapy (Signed)
 OUTPATIENT PHYSICAL THERAPY NEURO AND LOW BACK TREATMENT   Patient Name: Larry [name] MRN: [id number] DOB:06-14-1951, 73 y.o., male Today's Date: 02/29/2024   PCP: Dr. Arlyss Solian REFERRING PROVIDER: Dr. Arlyss Solian  END OF SESSION:  PT End of Session - 02/28/24 1530     Visit Number 14    Number of Visits 24    Date for PT Re-Evaluation 03/15/24    Progress Note Due on Visit 20    PT Start Time 1445    PT Stop Time 1528    PT Time Calculation (min) 43 min    Equipment Utilized During Treatment Gait belt    Activity Tolerance Patient tolerated treatment well    Behavior During Therapy WFL for tasks assessed/performed                  Past Medical History:  Diagnosis Date   ADD (attention deficit disorder)    Allergy    seasonal flares of perinneal allergies   Cancer (HCC)    melanoma   Cardiac arrest (HCC) 07/1997   aborted   Dementia (HCC)    post resusitative   Depression    Diverticulosis    Family hx of prostate cancer    GERD (gastroesophageal reflux disease)    Headache(784.0)    Hyperlipidemia    Hypothyroidism    ICD (implantable cardiac defibrillator) in place    Idiopathic urticaria    Internal hemorrhoids    Low back pain syndrome    Melanoma in situ of skin of trunk (HCC)    chest   PVC (premature ventricular contraction)    associated with cardiac arrest   Sleep apnea    no c-pap   Tubular adenoma of colon 10/2010   Past Surgical History:  Procedure Laterality Date    implantation x2     CARDIAC DEFIBRILLATOR PLACEMENT  07/01/2010   Explanation of a previously implanted device, pocket revision, and insertion of a new device, and intraoperative defibrillation threshold testing Robyne)   CARDIAC DEFIBRILLATOR REMOVAL  07/05/2002   Defibrillator change Robyne) 3 times changed   CATARACT EXTRACTION W/PHACO Left 02/04/2021   Procedure: CATARACT EXTRACTION PHACO AND INTRAOCULAR LENS PLACEMENT (IOC) LEFT VIVITY toric LENS 9.21  01:11.6;  Surgeon: Mittie Gaskin, MD;  Location: [facility];  Service: Ophthalmology;  Laterality: Left;  keep patient at 11:30 arrival   CATARACT EXTRACTION W/PHACO Right 03/04/2021   Procedure: CATARACT EXTRACTION PHACO AND INTRAOCULAR LENS PLACEMENT (IOC) RIGHT VIVITY LENS 8.59 01:15.3;  Surgeon: Mittie Gaskin, MD;  Location: [facility];  Service: Ophthalmology;  Laterality: Right;   COLONOSCOPY     HEMORRHOID SURGERY     ICD     TONSILLECTOMY     Patient Active Problem List   Diagnosis Date Noted   Back pain 11/13/2023   Cervical dystonia 06/15/2022   Neck pain 07/02/2021   Gait abnormality 12/16/2020   Nonintractable headache 09/30/2020   Tinnitus 09/09/2019   History of sudden cardiac arrest 01/09/2019   Viral syndrome 09/06/2018   Seizures (HCC) 03/02/2018   Syncope 12/26/2017   Other social stressor 12/26/2017   Health care maintenance 08/14/2017   Family hx of prostate cancer    Allergy    Insomnia 10/20/2015   Advance care planning 03/08/2014   Memory loss 09/14/2013   Hypoxic brain injury (HCC) 09/14/2013   Medicare annual wellness visit, subsequent 02/16/2013   Shortness of breath 08/10/2011   Implantable cardioverter-defibrillator (ICD) in situ 02/03/2009   Hypothyroidism  11/18/2008   CONTACT DERMATITIS&OTHER ECZEMA DUE TO PLANTS 02/03/2008   IDIOPATHIC URTICARIA 02/03/2008   Hypertension 02/03/2008   HLD (hyperlipidemia) Nov 13, 2007   Attention deficit disorder 2007-11-13   PVC/VT non sustained 11-13-07   Sleep apnea 11/13/2007   HEADACHE 13-Nov-2007   SUDDEN DEATH-aborted 13-Nov-2007   LOW BACK PAIN SYNDROME 10/11/2007    ONSET DATE: worse since April 2025  REFERRING DIAG:  Z91.81 (ICD-10-CM) - History of fall  R26.9 (ICD-10-CM) - Gait abnormality  M54.9 (ICD-10-CM) - Back pain, unspecified back location, unspecified back pain laterality, unspecified chronicity    THERAPY DIAG:  Muscle weakness  (generalized)  Difficulty in walking, not elsewhere classified  Abnormality of gait and mobility  Other lack of coordination  Unsteadiness on feet  Chronic midline low back pain with left-sided sciatica  Other abnormalities of gait and mobility  Rationale for Evaluation and Treatment: Rehabilitation  SUBJECTIVE:                                                                                                                                                                                             SUBJECTIVE STATEMENT: Patient reports doing okay with no new issues. States back is tight but moving.      Pt accompanied by: significant other  PERTINENT HISTORY: Patient reports fall in April with subsequent report of low back. PMH includes: Anoxic brain injury in 1999 due to prolonged cardiac arrest, with residual mild cognitive impairment- Slow worsening over the years. He was most recently seen here in outpatient PT clinic for chronic neck pain with good results - now here with order for abnormal gait and low back pain.   PAIN:  Are you having pain? Yes: NPRS scale: current=2 /10; worst 8/10; best= 0/10 Pain location: Low back; Neck pain Pain description: Sharp with movement Aggravating factors: Transfers, bending, prolonged sitting/standing, any twisting; lifting Relieving factors: pain meds, rest, heat  PRECAUTIONS: Fall  RED FLAGS: None   WEIGHT BEARING RESTRICTIONS: No  FALLS: Has patient fallen in last 6 months? Yes. Number of falls 3  LIVING ENVIRONMENT: Lives with: lives with their spouse Lives in: House/apartment Stairs: No Has following equipment at home: None  PLOF: Independent  PATIENT GOALS: Patient reports wanting to feel better and have less pain and not fall  OBJECTIVE:  Note: Objective measures were completed at Evaluation unless otherwise noted.  DIAGNOSTIC FINDINGS: None recent  COGNITION: Overall cognitive status: History of cognitive  impairments - at baseline   SENSATION: Light touch: WFL  COORDINATION: Delay reaction time with heel to shin and several other tasks during balance testing today.   EDEMA:  None observed  POSTURE: rounded shoulders, forward head, and stiffness c-spine  LOWER EXTREMITY ROM:     Active  Right Eval Left Eval  Hip flexion    Hip extension    Hip abduction    Hip adduction    Hip internal rotation    Hip external rotation    Knee flexion    Knee extension    Ankle dorsiflexion    Ankle plantarflexion    Ankle inversion    Ankle eversion     (Blank rows = not tested)  LOWER EXTREMITY MMT:    MMT Right Eval Left Eval  Hip flexion 4 4-  Hip extension    Hip abduction 4 4-  Hip adduction    Hip internal rotation 4 4-  Hip external rotation 4 4-  Knee flexion 4 4-  Knee extension 4 4-  Ankle dorsiflexion 4 4  Ankle plantarflexion 4 4  Ankle inversion 4 4  Ankle eversion 4 4  (Blank rows = not tested)  BED MOBILITY:  Findings: Sit to supine Complete Independence Supine to sit Complete Independence Rolling to Right Complete Independence Rolling to Left Complete Independence  TRANSFERS: Sit to stand: SBA  Assistive device utilized: None     Stand to sit: SBA  Assistive device utilized: None      RAMP:  Not tested  CURB:  Not tested  STAIRS: Findings: Number of Stairs: 4, Height of Stairs: 6   , and Comments: use of railings GAIT: Findings: Gait Characteristics: decreased arm swing- Right, decreased arm swing- Left, decreased step length- Right, decreased step length- Left, decreased stance time- Right, decreased stance time- Left, decreased stride length, and shuffling, Distance walked: <100 feet, Assistive device utilized:None, Level of assistance: SBA, and Comments: shuffling- unsteady- stiff with minimal UE sway and stiff neck/posture  FUNCTIONAL TESTS:   5 times sit to stand: 23.62 sec without UE Support *pain LBP Timed up and go (TUG): 23.12 and  23.56 sec without AD= 23.34 sec avg 6 minute walk test: to be assessed next 1-2 visits 10 meter walk test: 12.36 sec= 0.81 m/s Berg Balance Scale:  Item Test date: 12/22/2023 Test date:  Test date:   Sitting to standing 4. able to stand without using hands and stabilize independently Insert OPRCBERGREEVAL SmartPhrase at re-test date Insert OPRCBERGREEVAL SmartPhrase at re-test date  2. Standing unsupported 4. able to stand safely for 2 minutes    3. Sitting with back unsupported, feet supported 4. able to sit safely and securely for 2 minutes    4. Standing to sitting 4. sits safely with minimal use of hands    5. Pivot transfer  4. able to transfer safely with minor use of hands    6. Standing unsupported with eyes closed 3. able to stand 10 seconds with supervision    7. Standing unsupported with feet together 3. able to place feet together independently and stand 1 minute with supervision    8. Reaching forward with outstretched arms while standing 3. can reach forward 12 cm (5 inches)    9. Pick up object from the floor from standing 3. able to pick up slipper but needs supervision    10. Turning to look behind over left and right shoulders while standing 2. turns sideways but only maintains balance    11. Turn 360 degrees 2. able to turn 360 degrees safely but slowly    12. Place alternate foot on step or stool while standing unsupported 4. ble to  stand independently and safely and complete 8 steps in 20 seconds    13. Standing unsupported one foot in front 2. able to take small step independently and hold 30 seconds    14. Standing on one leg 1. tries to lift leg unable to hold 3 seconds but remains standing independently.      Total Score 43/56 Total Score /56 Total Score /56    Functional gait assessment: To be assessed next 1-2 visits  PATIENT SURVEYS:  ABC scale 58.13%    SUBJECTIVE Chief complaint:  Low back  History: chronic but worse after fall earlier this year.  Red flags  None of the following: (bowel/bladder changes, saddle paresthesia, personal history of cancer, chills/fever, night sweats, unrelenting pain, first onset of insidious LBP <20 y/o) Negative  Pain location: low back - central L4-S1 Pain: Present 0/10, Best 0/10, Worst 8/10 Pain quality: aching, dull, and stabbing Radiating pain: No  Numbness/Tingling: No 24 hour pain behavior: Pain with transfers and reaching/bending over Aggravating factors: transfers/reaching/bending Easing factors: rest in recliner, Medication How long can you sit: as long as I want How long can you stand: 10 min before pain stops me How long can you walk: unlimited  History of prior back injury, pain, surgery, or therapy: No Follow-up appointment with MD: No Dominant hand: right Imaging: Yes -X ray Falls in the last 6 months: Yes  Occupational demands: Not currently working  Hobbies: gardening; playing cards Goals: To be as painfree   OBJECTIVE     Gait Wide based gait- no device- stiff nature with decreased UE swing  Posture Lumbar lordosis: WNL Iliac crest height: equal bilaterally Lumbar lateral shift: negative   AROM (degrees) R/L (all movements include overpressure unless otherwise stated) Lumbar forward flexion: 25* Lumbar extension: 10* Lumbar lateral flexion: R: fingers to 2 above knee jt line L:  fingers approx 3 above lateral knee jt. Line*  *Indicates pain     Repeated Movements No centralization or peripheralization of symptoms with repeated lumbar extension or flexion.      Sensation intact to light touch bilateral LEs as determined by testing dermatomes L2-S2.    R Palpation Graded on 0-4 scale (0 = no pain, 1 = pain, 2 = pain with wincing/grimacing/flinching, 3 = pain with withdrawal, 4 = unwilling to allow palpation) Location LEFT  RIGHT           Lumbar paraspinals 2 2  Quadratus Lumborum 0 0  Gluteus Maximus 0 0  Gluteus Medius 0 0  Deep hip external rotators 0 0   PSIS 0 0  Fortin's Area (SIJ) 0 0  Greater Trochanter 0 0     Muscle Length Hamstrings: R: 33 degrees L: 22 degrees      Passive Accessory Intervertebral Motion (PAIVM) Pt reports some reproduction of back pain with CPA L1-L5 and UPA bilaterally L1-L5. Generally hypomobile throughout    SPECIAL TESTS Lumbar Radiculopathy and Discogenic:  Slump (SN 83, -LR 0.32): R: Negative L: Negative SLR (SN 92, -LR 0.29): R: Negative L:  Negative      SIJ:  Thigh Thrust (SN 88, -LR 0.18) : R: Negative L: Negative    Functional Tasks Lifting: limited due to pain Deep squat: not tested Sit to stand: slow and guarded  6 Min Walk Test:  Instructed patient to ambulate as quickly and as safely as possible for 6 minutes using LRAD. Patient was allowed to take standing rest breaks without stopping the test, but if the patient required  a sitting rest break the clock would be stopped and the test would be over.  Results: 950 feet (290 meters, Avg speed .81 m/s) using no AD with CGA. Results indicate that the patient has reduced endurance with ambulation compared to age matched norms.  Age Matched Norms: 32-69 yo M: 28 F: 37, 73-79 yo M: 49 F: 471, 41-89 yo M: 417 F: 392 MDC: 58.21 meters (190.98 feet) or 50 meters (ANPTA Core Set of Outcome Measures for Adults with Neurologic Conditions, 2018)                                                                                                                              TREATMENT DATE: 02/28/2024    TA:  Sit to stand with abdominal bracing and No UE Support x 15 reps Standing hip march with abdominal bracing 2 x 10 reps Standing hip abd with abdominal bracing 3# 2 x 10 reps Standing hip ext with abdominal bracing at support bar 2 x 10 reps   Standing calf raises with abdominal bracing at support bar x 15 reps with 3#AW Fwd step ups  with abdominal bracing onto 6 block x 20 reps with 3#AW Mini Lunge Squat 3#AW- alt LE x 15 reps each.     Self care/Home management:  Reviewed all above exercises and issued another handout for current and previous activities for optimal HEP Compliance.      PATIENT EDUCATION:    Education details: Exercise technique for HEP  Person educated: Patient and Spouse Education method: Explanation, Demonstration, Tactile cues, and Verbal cues Education comprehension: verbalized understanding, returned demonstration, verbal cues required, tactile cues required, and needs further education  HOME EXERCISE PROGRAM: Access Code: [email or web address] URL: [email or web address] Date: 02/28/2024 Prepared by: Reyes London  Exercises - Standing March with Counter Support  - 3 x weekly - 3 sets - 10 reps - Standing Hip Abduction with Counter Support  - 3 x weekly - 3 sets - 10 reps - Standing Hip Extension with Counter Support  - 3 x weekly - 3 sets - 10 reps - Mini Squat with Counter Support  - 3 x weekly - 3 sets - 10 reps - Step Up  - 3 x weekly - 3 sets - 10 reps     Access Code: [email or web address] URL: [email or web address] Date: 01/19/2024 Prepared by: Reyes London  Exercises - Seated Hamstring Stretch  - 1 x daily - 3 sets - 30 sec hold - Seated Piriformis Stretch with Trunk Bend  - 1 x daily - 3 sets - 30 sec  hold - Seated Piriformis Stretch  - 1 x daily - 3 sets - 30 hold - Seated Lumbar Flexion Stretch  - 1 x daily - 3 sets - 30 hold - Sidelying Thoracic Lumbar Rotation  - 1 x daily - 3 sets - 30 hold - Supine Bridge  - 3 x weekly - 3 sets - 10 reps - Sit to Stand with  Arms Crossed  - 3 x weekly - 3 sets - 10 reps       Access Code: [email or web address] URL: [email or web address] Date: 12/29/2023 Prepared by: Reyes London  Exercises - Supine Single Knee to Chest Stretch  - 1 x daily - 3 sets - 30 hold - Supine Lower Trunk Rotation  - 1 x daily - 3 sets - 30 sec  hold - Seated Hamstring Stretch  - 1 x daily - 3 sets - 30 hold - Supine  Piriformis Stretch with Leg Straight  - 1 x daily - 3 sets - 30 hold - Seated 3 Way Exercise Ball Roll Out Stretch  - 1 x daily - 3 sets - 10 reps - Supine Bridge  - 3 x weekly - 3 sets - 10 reps    12/27/2023:Added seated hamstring, Lower trunk rotation, and knee to chest- will update and issue HEP next session. EVAL: Added Tandem standing and SLS- will need to issue handout next visit along with any Low back stretches  GOALS: Goals reviewed with patient? Yes  SHORT TERM GOALS: Target date: 02/02/2024  Pt will be independent with HEP in order to improve strength and balance in order to decrease fall risk and improve function at home. Baseline: EVAL- No formal HEP for balance; 02/09/2024= Patient and wife report independence but still working through breathing when working out.  Goal status: MET  LONG TERM GOALS: Target date: 03/15/2024  1.  Patient will complete five times sit to stand test in < 15 seconds indicating an increased LE strength and improved balance. Baseline: EVAL= 23.62 sec; 02/09/2024= 18.54 sec without UE SUpport Goal status: PROGRESSING  2.  Patient will improve ABC score to >70%  to demonstrate statistically significant improvement in mobility and quality of life as it relates to their confidence in balance.  Baseline: EVAL= 58.13%; 02/09/2024= 51.25% Goal status: ONGOING   3.  Patient will increase Berg Balance score by > 6 points to demonstrate decreased fall risk during functional activities. Baseline: EVAL= 43/56 Goal status: INITIAL   4.   Patient will reduce timed up and go to <11 seconds to reduce fall risk and demonstrate improved transfer/gait ability. Baseline: EVAL= 23.34 sec without AD; 02/09/2024= 15.2 sec without AD Goal status: PROGRESSING  5.   Patient will increase 10 meter walk test to >1.4m/s as to improve gait speed for better community ambulation and to reduce fall risk. Baseline: EVAL=0.81 m/s without AD; 02/09/2024= 0.79 m/s avg without AD Goal  status: Ongoing  6.   Patient will increase six minute walk test distance to >1000 for progression to community ambulator and improve gait ability Baseline: EVAL= To be assessed next visit; 12/27/2023= 950 feet without AD; 02/09/2024= 1145 feet without an AD Goal status: INITIAL  7. Pt will decrease mODI score by at least 13 points in order demonstrate clinically significant reduction in back pain/disability.   Baseline: will issue next visit; 12/29/2023= 42%  Goal status: NEW  8. Pt will decrease worst back pain as reported on NPRS by at least 2 points in order to demonstrate clinically significant reduction in back pain.   Baseline: 12/27/2023= 8/10 worst low back pain; 02/09/2024 = 6/10 LBP  Goal status: NEW 9. Patient will demonstrate 50% improved lumbar AROM for improved transfers/bed mobility, and standing posture.   Baseline: 12/27/2023- very restricted ROM (see table above)   Goal status: NEW  Pt will improve FGA by at least 3 points in order to demonstrate clinically significant improvement in  balance and decreased risk for falls.  Baseline: 01/12/2024= 15  Goal Status: NEW   ASSESSMENT:  CLINICAL IMPRESSION: Patient is a 73 y.o. male who was seen today for physical therapy treatment for chronic back pain. Patient requested another handout for LE activities and performed well overall but still some difficulty maintaining contraction with core while performing dynamic LE activity.  Able to progress some of these exercises to HEP today without incidence. Wife present and will supervise patient at home. Patient will benefit from skilled PT services to address his low back symptoms, improve balance, and decrease risk for future falls.   OBJECTIVE IMPAIRMENTS: Abnormal gait, decreased activity tolerance, decreased balance, decreased cognition, decreased coordination, decreased endurance, decreased mobility, difficulty walking, decreased strength, and pain.   ACTIVITY LIMITATIONS: carrying,  lifting, bending, sitting, standing, squatting, stairs, and transfers  PARTICIPATION LIMITATIONS: meal prep, cleaning, laundry, shopping, community activity, and yard work  PERSONAL FACTORS: 1-2 comorbidities: anoxic brain injury; depression are also affecting patient's functional outcome.   REHAB POTENTIAL: Good  CLINICAL DECISION MAKING: Evolving/moderate complexity  EVALUATION COMPLEXITY: Moderate  PLAN:  PT FREQUENCY: 1-2x/week  PT DURATION: 12 weeks  PLANNED INTERVENTIONS: [id number]- PT Re-evaluation, 97750- Physical Performance Testing, 97110-Therapeutic exercises, 97530- Therapeutic activity, [id number]- Neuromuscular re-education, 97535- Self Care, [id number]- Manual therapy, [id number]- Gait training, [phone]- Orthotic Initial, [phone]- Orthotic/Prosthetic subsequent, [phone]- Canalith repositioning, Patient/Family education, Balance training, Stair training, Taping, Joint mobilization, Joint manipulation, Spinal manipulation, Spinal mobilization, Compression bandaging, Vestibular training, DME instructions, Cryotherapy, and Moist heat  PLAN FOR NEXT SESSION:  KORE BALANCE For dual task Review and progress HEP Continue to progress balance  Continue to progress core/low back strengthening Cog dual task with dynamic balance    Reyes LOISE London, PT 02/29/2024, 5:26 PM

## 2024-02-28 ENCOUNTER — Ambulatory Visit

## 2024-02-28 DIAGNOSIS — R269 Unspecified abnormalities of gait and mobility: Secondary | ICD-10-CM

## 2024-02-28 DIAGNOSIS — R2681 Unsteadiness on feet: Secondary | ICD-10-CM | POA: Diagnosis not present

## 2024-02-28 DIAGNOSIS — M6281 Muscle weakness (generalized): Secondary | ICD-10-CM | POA: Diagnosis not present

## 2024-02-28 DIAGNOSIS — G8929 Other chronic pain: Secondary | ICD-10-CM

## 2024-02-28 DIAGNOSIS — R262 Difficulty in walking, not elsewhere classified: Secondary | ICD-10-CM | POA: Diagnosis not present

## 2024-02-28 DIAGNOSIS — R296 Repeated falls: Secondary | ICD-10-CM | POA: Diagnosis not present

## 2024-02-28 DIAGNOSIS — M5442 Lumbago with sciatica, left side: Secondary | ICD-10-CM | POA: Diagnosis not present

## 2024-02-28 DIAGNOSIS — R278 Other lack of coordination: Secondary | ICD-10-CM | POA: Diagnosis not present

## 2024-02-28 DIAGNOSIS — R2689 Other abnormalities of gait and mobility: Secondary | ICD-10-CM

## 2024-03-01 ENCOUNTER — Ambulatory Visit

## 2024-03-01 DIAGNOSIS — R278 Other lack of coordination: Secondary | ICD-10-CM | POA: Diagnosis not present

## 2024-03-01 DIAGNOSIS — M6281 Muscle weakness (generalized): Secondary | ICD-10-CM

## 2024-03-01 DIAGNOSIS — R2681 Unsteadiness on feet: Secondary | ICD-10-CM

## 2024-03-01 DIAGNOSIS — G8929 Other chronic pain: Secondary | ICD-10-CM

## 2024-03-01 DIAGNOSIS — R269 Unspecified abnormalities of gait and mobility: Secondary | ICD-10-CM | POA: Diagnosis not present

## 2024-03-01 DIAGNOSIS — R2689 Other abnormalities of gait and mobility: Secondary | ICD-10-CM

## 2024-03-01 DIAGNOSIS — R262 Difficulty in walking, not elsewhere classified: Secondary | ICD-10-CM | POA: Diagnosis not present

## 2024-03-01 DIAGNOSIS — R296 Repeated falls: Secondary | ICD-10-CM | POA: Diagnosis not present

## 2024-03-01 DIAGNOSIS — M5442 Lumbago with sciatica, left side: Secondary | ICD-10-CM | POA: Diagnosis not present

## 2024-03-01 NOTE — Therapy (Signed)
 OUTPATIENT PHYSICAL THERAPY NEURO AND LOW BACK TREATMENT   Patient Name: Larry [name] MRN: [id number] DOB:April 13, 1951, 73 y.o., male Today's Date: 03/01/2024   PCP: Dr. Arlyss Solian REFERRING PROVIDER: Dr. Arlyss Solian  END OF SESSION:  PT End of Session - 03/01/24 1626     Visit Number 15    Number of Visits 24    Date for PT Re-Evaluation 03/15/24    Progress Note Due on Visit 20    PT Start Time 1618    PT Stop Time 1659    PT Time Calculation (min) 41 min    Equipment Utilized During Treatment Gait belt    Activity Tolerance Patient tolerated treatment well    Behavior During Therapy WFL for tasks assessed/performed                  Past Medical History:  Diagnosis Date   ADD (attention deficit disorder)    Allergy    seasonal flares of perinneal allergies   Cancer (HCC)    melanoma   Cardiac arrest (HCC) 07/1997   aborted   Dementia (HCC)    post resusitative   Depression    Diverticulosis    Family hx of prostate cancer    GERD (gastroesophageal reflux disease)    Headache(784.0)    Hyperlipidemia    Hypothyroidism    ICD (implantable cardiac defibrillator) in place    Idiopathic urticaria    Internal hemorrhoids    Low back pain syndrome    Melanoma in situ of skin of trunk (HCC)    chest   PVC (premature ventricular contraction)    associated with cardiac arrest   Sleep apnea    no c-pap   Tubular adenoma of colon 10/2010   Past Surgical History:  Procedure Laterality Date    implantation x2     CARDIAC DEFIBRILLATOR PLACEMENT  07/01/2010   Explanation of a previously implanted device, pocket revision, and insertion of a new device, and intraoperative defibrillation threshold testing Robyne)   CARDIAC DEFIBRILLATOR REMOVAL  07/05/2002   Defibrillator change Robyne) 3 times changed   CATARACT EXTRACTION W/PHACO Left 02/04/2021   Procedure: CATARACT EXTRACTION PHACO AND INTRAOCULAR LENS PLACEMENT (IOC) LEFT VIVITY toric LENS 9.21  01:11.6;  Surgeon: Mittie Gaskin, MD;  Location: [facility];  Service: Ophthalmology;  Laterality: Left;  keep patient at 11:30 arrival   CATARACT EXTRACTION W/PHACO Right 03/04/2021   Procedure: CATARACT EXTRACTION PHACO AND INTRAOCULAR LENS PLACEMENT (IOC) RIGHT VIVITY LENS 8.59 01:15.3;  Surgeon: Mittie Gaskin, MD;  Location: [facility];  Service: Ophthalmology;  Laterality: Right;   COLONOSCOPY     HEMORRHOID SURGERY     ICD     TONSILLECTOMY     Patient Active Problem List   Diagnosis Date Noted   Back pain 11/13/2023   Cervical dystonia 06/15/2022   Neck pain 07/02/2021   Gait abnormality 12/16/2020   Nonintractable headache 09/30/2020   Tinnitus 09/09/2019   History of sudden cardiac arrest 01/09/2019   Viral syndrome 09/06/2018   Seizures (HCC) 03/02/2018   Syncope 12/26/2017   Other social stressor 12/26/2017   Health care maintenance 08/14/2017   Family hx of prostate cancer    Allergy    Insomnia 10/20/2015   Advance care planning 03/08/2014   Memory loss 09/14/2013   Hypoxic brain injury (HCC) 09/14/2013   Medicare annual wellness visit, subsequent 02/16/2013   Shortness of breath 08/10/2011   Implantable cardioverter-defibrillator (ICD) in situ 02/03/2009   Hypothyroidism  11/18/2008   CONTACT DERMATITIS&OTHER ECZEMA DUE TO PLANTS 02/03/2008   IDIOPATHIC URTICARIA 02/03/2008   Hypertension 02/03/2008   HLD (hyperlipidemia) Oct 29, 2007   Attention deficit disorder 10-29-2007   PVC/VT non sustained 10/29/07   Sleep apnea 10-29-2007   HEADACHE 10-29-2007   SUDDEN DEATH-aborted 29-Oct-2007   LOW BACK PAIN SYNDROME 10/11/2007    ONSET DATE: worse since April 2025  REFERRING DIAG:  Z91.81 (ICD-10-CM) - History of fall  R26.9 (ICD-10-CM) - Gait abnormality  M54.9 (ICD-10-CM) - Back pain, unspecified back location, unspecified back pain laterality, unspecified chronicity    THERAPY DIAG:  Muscle weakness  (generalized)  Difficulty in walking, not elsewhere classified  Abnormality of gait and mobility  Other lack of coordination  Unsteadiness on feet  Chronic midline low back pain with left-sided sciatica  Other abnormalities of gait and mobility  Rationale for Evaluation and Treatment: Rehabilitation  SUBJECTIVE:                                                                                                                                                                                             SUBJECTIVE STATEMENT: Patient reports doing well- only grunting when exercising.        Pt accompanied by: significant other  PERTINENT HISTORY: Patient reports fall in April with subsequent report of low back. PMH includes: Anoxic brain injury in 1999 due to prolonged cardiac arrest, with residual mild cognitive impairment- Slow worsening over the years. He was most recently seen here in outpatient PT clinic for chronic neck pain with good results - now here with order for abnormal gait and low back pain.   PAIN:  Are you having pain? Yes: NPRS scale: current=2 /10; worst 8/10; best= 0/10 Pain location: Low back; Neck pain Pain description: Sharp with movement Aggravating factors: Transfers, bending, prolonged sitting/standing, any twisting; lifting Relieving factors: pain meds, rest, heat  PRECAUTIONS: Fall  RED FLAGS: None   WEIGHT BEARING RESTRICTIONS: No  FALLS: Has patient fallen in last 6 months? Yes. Number of falls 3  LIVING ENVIRONMENT: Lives with: lives with their spouse Lives in: House/apartment Stairs: No Has following equipment at home: None  PLOF: Independent  PATIENT GOALS: Patient reports wanting to feel better and have less pain and not fall  OBJECTIVE:  Note: Objective measures were completed at Evaluation unless otherwise noted.  DIAGNOSTIC FINDINGS: None recent  COGNITION: Overall cognitive status: History of cognitive impairments - at  baseline   SENSATION: Light touch: WFL  COORDINATION: Delay reaction time with heel to shin and several other tasks during balance testing today.   EDEMA:  None observed  POSTURE:  rounded shoulders, forward head, and stiffness c-spine  LOWER EXTREMITY ROM:     Active  Right Eval Left Eval  Hip flexion    Hip extension    Hip abduction    Hip adduction    Hip internal rotation    Hip external rotation    Knee flexion    Knee extension    Ankle dorsiflexion    Ankle plantarflexion    Ankle inversion    Ankle eversion     (Blank rows = not tested)  LOWER EXTREMITY MMT:    MMT Right Eval Left Eval  Hip flexion 4 4-  Hip extension    Hip abduction 4 4-  Hip adduction    Hip internal rotation 4 4-  Hip external rotation 4 4-  Knee flexion 4 4-  Knee extension 4 4-  Ankle dorsiflexion 4 4  Ankle plantarflexion 4 4  Ankle inversion 4 4  Ankle eversion 4 4  (Blank rows = not tested)  BED MOBILITY:  Findings: Sit to supine Complete Independence Supine to sit Complete Independence Rolling to Right Complete Independence Rolling to Left Complete Independence  TRANSFERS: Sit to stand: SBA  Assistive device utilized: None     Stand to sit: SBA  Assistive device utilized: None      RAMP:  Not tested  CURB:  Not tested  STAIRS: Findings: Number of Stairs: 4, Height of Stairs: 6   , and Comments: use of railings GAIT: Findings: Gait Characteristics: decreased arm swing- Right, decreased arm swing- Left, decreased step length- Right, decreased step length- Left, decreased stance time- Right, decreased stance time- Left, decreased stride length, and shuffling, Distance walked: <100 feet, Assistive device utilized:None, Level of assistance: SBA, and Comments: shuffling- unsteady- stiff with minimal UE sway and stiff neck/posture  FUNCTIONAL TESTS:   5 times sit to stand: 23.62 sec without UE Support *pain LBP Timed up and go (TUG): 23.12 and 23.56 sec without  AD= 23.34 sec avg 6 minute walk test: to be assessed next 1-2 visits 10 meter walk test: 12.36 sec= 0.81 m/s Berg Balance Scale:  Item Test date: 12/22/2023 Test date:  Test date:   Sitting to standing 4. able to stand without using hands and stabilize independently Insert OPRCBERGREEVAL SmartPhrase at re-test date Insert OPRCBERGREEVAL SmartPhrase at re-test date  2. Standing unsupported 4. able to stand safely for 2 minutes    3. Sitting with back unsupported, feet supported 4. able to sit safely and securely for 2 minutes    4. Standing to sitting 4. sits safely with minimal use of hands    5. Pivot transfer  4. able to transfer safely with minor use of hands    6. Standing unsupported with eyes closed 3. able to stand 10 seconds with supervision    7. Standing unsupported with feet together 3. able to place feet together independently and stand 1 minute with supervision    8. Reaching forward with outstretched arms while standing 3. can reach forward 12 cm (5 inches)    9. Pick up object from the floor from standing 3. able to pick up slipper but needs supervision    10. Turning to look behind over left and right shoulders while standing 2. turns sideways but only maintains balance    11. Turn 360 degrees 2. able to turn 360 degrees safely but slowly    12. Place alternate foot on step or stool while standing unsupported 4. ble to stand independently and safely  and complete 8 steps in 20 seconds    13. Standing unsupported one foot in front 2. able to take small step independently and hold 30 seconds    14. Standing on one leg 1. tries to lift leg unable to hold 3 seconds but remains standing independently.      Total Score 43/56 Total Score /56 Total Score /56    Functional gait assessment: To be assessed next 1-2 visits  PATIENT SURVEYS:  ABC scale 58.13%    SUBJECTIVE Chief complaint:  Low back  History: chronic but worse after fall earlier this year.  Red flags None of the  following: (bowel/bladder changes, saddle paresthesia, personal history of cancer, chills/fever, night sweats, unrelenting pain, first onset of insidious LBP <20 y/o) Negative  Pain location: low back - central L4-S1 Pain: Present 0/10, Best 0/10, Worst 8/10 Pain quality: aching, dull, and stabbing Radiating pain: No  Numbness/Tingling: No 24 hour pain behavior: Pain with transfers and reaching/bending over Aggravating factors: transfers/reaching/bending Easing factors: rest in recliner, Medication How long can you sit: as long as I want How long can you stand: 10 min before pain stops me How long can you walk: unlimited  History of prior back injury, pain, surgery, or therapy: No Follow-up appointment with MD: No Dominant hand: right Imaging: Yes -X ray Falls in the last 6 months: Yes  Occupational demands: Not currently working  Hobbies: gardening; playing cards Goals: To be as painfree   OBJECTIVE     Gait Wide based gait- no device- stiff nature with decreased UE swing  Posture Lumbar lordosis: WNL Iliac crest height: equal bilaterally Lumbar lateral shift: negative   AROM (degrees) R/L (all movements include overpressure unless otherwise stated) Lumbar forward flexion: 25* Lumbar extension: 10* Lumbar lateral flexion: R: fingers to 2 above knee jt line L:  fingers approx 3 above lateral knee jt. Line*  *Indicates pain     Repeated Movements No centralization or peripheralization of symptoms with repeated lumbar extension or flexion.      Sensation intact to light touch bilateral LEs as determined by testing dermatomes L2-S2.    R Palpation Graded on 0-4 scale (0 = no pain, 1 = pain, 2 = pain with wincing/grimacing/flinching, 3 = pain with withdrawal, 4 = unwilling to allow palpation) Location LEFT  RIGHT           Lumbar paraspinals 2 2  Quadratus Lumborum 0 0  Gluteus Maximus 0 0  Gluteus Medius 0 0  Deep hip external rotators 0 0  PSIS 0 0   Fortin's Area (SIJ) 0 0  Greater Trochanter 0 0     Muscle Length Hamstrings: R: 33 degrees L: 22 degrees      Passive Accessory Intervertebral Motion (PAIVM) Pt reports some reproduction of back pain with CPA L1-L5 and UPA bilaterally L1-L5. Generally hypomobile throughout    SPECIAL TESTS Lumbar Radiculopathy and Discogenic:  Slump (SN 83, -LR 0.32): R: Negative L: Negative SLR (SN 92, -LR 0.29): R: Negative L:  Negative      SIJ:  Thigh Thrust (SN 88, -LR 0.18) : R: Negative L: Negative    Functional Tasks Lifting: limited due to pain Deep squat: not tested Sit to stand: slow and guarded  6 Min Walk Test:  Instructed patient to ambulate as quickly and as safely as possible for 6 minutes using LRAD. Patient was allowed to take standing rest breaks without stopping the test, but if the patient required a sitting rest break  the clock would be stopped and the test would be over.  Results: 950 feet (290 meters, Avg speed .81 m/s) using no AD with CGA. Results indicate that the patient has reduced endurance with ambulation compared to age matched norms.  Age Matched Norms: 59-69 yo M: 79 F: 5, 5-79 yo M: 84 F: 471, 49-89 yo M: 417 F: 392 MDC: 58.21 meters (190.98 feet) or 50 meters (ANPTA Core Set of Outcome Measures for Adults with Neurologic Conditions, 2018)                                                                                                                              TREATMENT DATE: 03/01/2024  THEREX:  Standing Thoracic Rotation (leaning against wall)  x 20 reps ea side.  Seated lumbar flex x 10 reps Seated Thoracic rotation x 10 reps  TA:  Sit to stand with abdominal bracing and No UE Support 2 x 10 reps Wall slides 2x 10 with abdominal bracing  LAQ with 3 sec hold with TrA contraction x 20 reps ea LE.  Seated hip march with TrA Contraction x 10 with 3 sec hold each LE   Self care/Home management:  Reviewed previously instructed HEP as  patient reported losing his papers and issued another handout for previous activities for optimal HEP Compliance.      PATIENT EDUCATION:    Education details: Exercise technique for HEP  Person educated: Patient and Spouse Education method: Explanation, Demonstration, Tactile cues, and Verbal cues Education comprehension: verbalized understanding, returned demonstration, verbal cues required, tactile cues required, and needs further education  HOME EXERCISE PROGRAM: Access Code: [email or web address] URL: [email or web address] Date: 02/28/2024 Prepared by: Reyes London  Exercises - Standing March with Counter Support  - 3 x weekly - 3 sets - 10 reps - Standing Hip Abduction with Counter Support  - 3 x weekly - 3 sets - 10 reps - Standing Hip Extension with Counter Support  - 3 x weekly - 3 sets - 10 reps - Mini Squat with Counter Support  - 3 x weekly - 3 sets - 10 reps - Step Up  - 3 x weekly - 3 sets - 10 reps     Access Code: [email or web address] URL: [email or web address] Date: 01/19/2024 Prepared by: Reyes London  Exercises - Seated Hamstring Stretch  - 1 x daily - 3 sets - 30 sec hold - Seated Piriformis Stretch with Trunk Bend  - 1 x daily - 3 sets - 30 sec  hold - Seated Piriformis Stretch  - 1 x daily - 3 sets - 30 hold - Seated Lumbar Flexion Stretch  - 1 x daily - 3 sets - 30 hold - Sidelying Thoracic Lumbar Rotation  - 1 x daily - 3 sets - 30 hold - Supine Bridge  - 3 x weekly - 3 sets - 10 reps - Sit to Stand with Arms Crossed  - 3 x weekly - 3 sets - 10 reps  Access Code: [email or web address] URL: [email or web address] Date: 12/29/2023 Prepared by: Reyes London  Exercises - Supine Single Knee to Chest Stretch  - 1 x daily - 3 sets - 30 hold - Supine Lower Trunk Rotation  - 1 x daily - 3 sets - 30 sec  hold - Seated Hamstring Stretch  - 1 x daily - 3 sets - 30 hold - Supine Piriformis Stretch with Leg Straight  - 1 x  daily - 3 sets - 30 hold - Seated 3 Way Exercise Ball Roll Out Stretch  - 1 x daily - 3 sets - 10 reps - Supine Bridge  - 3 x weekly - 3 sets - 10 reps    12/27/2023:Added seated hamstring, Lower trunk rotation, and knee to chest- will update and issue HEP next session. EVAL: Added Tandem standing and SLS- will need to issue handout next visit along with any Low back stretches  GOALS: Goals reviewed with patient? Yes  SHORT TERM GOALS: Target date: 02/02/2024  Pt will be independent with HEP in order to improve strength and balance in order to decrease fall risk and improve function at home. Baseline: EVAL- No formal HEP for balance; 02/09/2024= Patient and wife report independence but still working through breathing when working out.  Goal status: MET  LONG TERM GOALS: Target date: 03/15/2024  1.  Patient will complete five times sit to stand test in < 15 seconds indicating an increased LE strength and improved balance. Baseline: EVAL= 23.62 sec; 02/09/2024= 18.54 sec without UE SUpport Goal status: PROGRESSING  2.  Patient will improve ABC score to >70%  to demonstrate statistically significant improvement in mobility and quality of life as it relates to their confidence in balance.  Baseline: EVAL= 58.13%; 02/09/2024= 51.25% Goal status: ONGOING   3.  Patient will increase Berg Balance score by > 6 points to demonstrate decreased fall risk during functional activities. Baseline: EVAL= 43/56 Goal status: INITIAL   4.   Patient will reduce timed up and go to <11 seconds to reduce fall risk and demonstrate improved transfer/gait ability. Baseline: EVAL= 23.34 sec without AD; 02/09/2024= 15.2 sec without AD Goal status: PROGRESSING  5.   Patient will increase 10 meter walk test to >1.46m/s as to improve gait speed for better community ambulation and to reduce fall risk. Baseline: EVAL=0.81 m/s without AD; 02/09/2024= 0.79 m/s avg without AD Goal status: Ongoing  6.   Patient will increase  six minute walk test distance to >1000 for progression to community ambulator and improve gait ability Baseline: EVAL= To be assessed next visit; 12/27/2023= 950 feet without AD; 02/09/2024= 1145 feet without an AD Goal status: INITIAL  7. Pt will decrease mODI score by at least 13 points in order demonstrate clinically significant reduction in back pain/disability.   Baseline: will issue next visit; 12/29/2023= 42%  Goal status: NEW  8. Pt will decrease worst back pain as reported on NPRS by at least 2 points in order to demonstrate clinically significant reduction in back pain.   Baseline: 12/27/2023= 8/10 worst low back pain; 02/09/2024 = 6/10 LBP  Goal status: NEW 9. Patient will demonstrate 50% improved lumbar AROM for improved transfers/bed mobility, and standing posture.   Baseline: 12/27/2023- very restricted ROM (see table above)   Goal status: NEW  Pt will improve FGA by at least 3 points in order to demonstrate clinically significant improvement in balance and decreased risk for falls.  Baseline: 01/12/2024= 15  Goal Status: NEW   ASSESSMENT:  CLINICAL  IMPRESSION: Patient is a 73 y.o. male who was seen today for physical therapy treatment for chronic back pain. Patient with some report of soreness with LE lifting in seated position but able to perform wall slides and sit to stand well. Engaging his core with dynamic LE activities remains challenging at this time and he will continue to benefit from further training in subsequent visits. Patient will benefit from skilled PT services to address his low back symptoms, improve balance, and decrease risk for future falls.   OBJECTIVE IMPAIRMENTS: Abnormal gait, decreased activity tolerance, decreased balance, decreased cognition, decreased coordination, decreased endurance, decreased mobility, difficulty walking, decreased strength, and pain.   ACTIVITY LIMITATIONS: carrying, lifting, bending, sitting, standing, squatting, stairs, and  transfers  PARTICIPATION LIMITATIONS: meal prep, cleaning, laundry, shopping, community activity, and yard work  PERSONAL FACTORS: 1-2 comorbidities: anoxic brain injury; depression are also affecting patient's functional outcome.   REHAB POTENTIAL: Good  CLINICAL DECISION MAKING: Evolving/moderate complexity  EVALUATION COMPLEXITY: Moderate  PLAN:  PT FREQUENCY: 1-2x/week  PT DURATION: 12 weeks  PLANNED INTERVENTIONS: [id number]- PT Re-evaluation, 97750- Physical Performance Testing, 97110-Therapeutic exercises, 97530- Therapeutic activity, [id number]- Neuromuscular re-education, 97535- Self Care, [id number]- Manual therapy, [phone]- Gait training, [phone]- Orthotic Initial, [phone]- Orthotic/Prosthetic subsequent, [phone]- Canalith repositioning, Patient/Family education, Balance training, Stair training, Taping, Joint mobilization, Joint manipulation, Spinal manipulation, Spinal mobilization, Compression bandaging, Vestibular training, DME instructions, Cryotherapy, and Moist heat  PLAN FOR NEXT SESSION:  Continue to progress balance  Continue to progress core/low back strengthening Cog dual task with dynamic balance    Reyes LOISE London, PT 03/01/2024, 5:41 PM

## 2024-03-08 ENCOUNTER — Ambulatory Visit: Attending: Neurology

## 2024-03-08 DIAGNOSIS — R2689 Other abnormalities of gait and mobility: Secondary | ICD-10-CM | POA: Diagnosis not present

## 2024-03-08 DIAGNOSIS — R2681 Unsteadiness on feet: Secondary | ICD-10-CM | POA: Insufficient documentation

## 2024-03-08 DIAGNOSIS — G8929 Other chronic pain: Secondary | ICD-10-CM | POA: Insufficient documentation

## 2024-03-08 DIAGNOSIS — R269 Unspecified abnormalities of gait and mobility: Secondary | ICD-10-CM | POA: Diagnosis not present

## 2024-03-08 DIAGNOSIS — R262 Difficulty in walking, not elsewhere classified: Secondary | ICD-10-CM | POA: Insufficient documentation

## 2024-03-08 DIAGNOSIS — M5442 Lumbago with sciatica, left side: Secondary | ICD-10-CM | POA: Insufficient documentation

## 2024-03-08 DIAGNOSIS — M6281 Muscle weakness (generalized): Secondary | ICD-10-CM | POA: Insufficient documentation

## 2024-03-08 DIAGNOSIS — R278 Other lack of coordination: Secondary | ICD-10-CM | POA: Diagnosis not present

## 2024-03-08 NOTE — Therapy (Signed)
 OUTPATIENT PHYSICAL THERAPY NEURO AND LOW BACK TREATMENT   Patient Name: Larry [name] MRN: [id number] DOB:1951/02/12, 73 y.o., male Today's Date: 03/09/2024   PCP: Dr. Arlyss Solian REFERRING PROVIDER: Dr. Arlyss Solian  END OF SESSION:  PT End of Session - 03/08/24 1410     Visit Number 16    Number of Visits 24    Date for PT Re-Evaluation 03/15/24    Progress Note Due on Visit 20    PT Start Time 1403    PT Stop Time 1444    PT Time Calculation (min) 41 min    Equipment Utilized During Treatment Gait belt    Activity Tolerance Patient tolerated treatment well    Behavior During Therapy WFL for tasks assessed/performed                   Past Medical History:  Diagnosis Date   ADD (attention deficit disorder)    Allergy    seasonal flares of perinneal allergies   Cancer (HCC)    melanoma   Cardiac arrest (HCC) 07/1997   aborted   Dementia (HCC)    post resusitative   Depression    Diverticulosis    Family hx of prostate cancer    GERD (gastroesophageal reflux disease)    Headache(784.0)    Hyperlipidemia    Hypothyroidism    ICD (implantable cardiac defibrillator) in place    Idiopathic urticaria    Internal hemorrhoids    Low back pain syndrome    Melanoma in situ of skin of trunk (HCC)    chest   PVC (premature ventricular contraction)    associated with cardiac arrest   Sleep apnea    no c-pap   Tubular adenoma of colon 10/2010   Past Surgical History:  Procedure Laterality Date    implantation x2     CARDIAC DEFIBRILLATOR PLACEMENT  07/01/2010   Explanation of a previously implanted device, pocket revision, and insertion of a new device, and intraoperative defibrillation threshold testing Robyne)   CARDIAC DEFIBRILLATOR REMOVAL  07/05/2002   Defibrillator change Robyne) 3 times changed   CATARACT EXTRACTION W/PHACO Left 02/04/2021   Procedure: CATARACT EXTRACTION PHACO AND INTRAOCULAR LENS PLACEMENT (IOC) LEFT VIVITY toric LENS 9.21  01:11.6;  Surgeon: Mittie Gaskin, MD;  Location: [facility];  Service: Ophthalmology;  Laterality: Left;  keep patient at 11:30 arrival   CATARACT EXTRACTION W/PHACO Right 03/04/2021   Procedure: CATARACT EXTRACTION PHACO AND INTRAOCULAR LENS PLACEMENT (IOC) RIGHT VIVITY LENS 8.59 01:15.3;  Surgeon: Mittie Gaskin, MD;  Location: [facility];  Service: Ophthalmology;  Laterality: Right;   COLONOSCOPY     HEMORRHOID SURGERY     ICD     TONSILLECTOMY     Patient Active Problem List   Diagnosis Date Noted   Back pain 11/13/2023   Cervical dystonia 06/15/2022   Neck pain 07/02/2021   Gait abnormality 12/16/2020   Nonintractable headache 09/30/2020   Tinnitus 09/09/2019   History of sudden cardiac arrest 01/09/2019   Viral syndrome 09/06/2018   Seizures (HCC) 03/02/2018   Syncope 12/26/2017   Other social stressor 12/26/2017   Health care maintenance 08/14/2017   Family hx of prostate cancer    Allergy    Insomnia 10/20/2015   Advance care planning 03/08/2014   Memory loss 09/14/2013   Hypoxic brain injury (HCC) 09/14/2013   Medicare annual wellness visit, subsequent 02/16/2013   Shortness of breath 08/10/2011   Implantable cardioverter-defibrillator (ICD) in situ 02/03/2009  Hypothyroidism 11/18/2008   CONTACT DERMATITIS&OTHER ECZEMA DUE TO PLANTS 02/03/2008   IDIOPATHIC URTICARIA 02/03/2008   Hypertension 02/03/2008   HLD (hyperlipidemia) 11-08-07   Attention deficit disorder 11/08/07   PVC/VT non sustained 2007-11-08   Sleep apnea 2007/11/08   HEADACHE Nov 08, 2007   SUDDEN DEATH-aborted 2007-11-08   LOW BACK PAIN SYNDROME 10/11/2007    ONSET DATE: worse since April 2025  REFERRING DIAG:  Z91.81 (ICD-10-CM) - History of fall  R26.9 (ICD-10-CM) - Gait abnormality  M54.9 (ICD-10-CM) - Back pain, unspecified back location, unspecified back pain laterality, unspecified chronicity    THERAPY DIAG:  Muscle weakness  (generalized)  Difficulty in walking, not elsewhere classified  Abnormality of gait and mobility  Other lack of coordination  Unsteadiness on feet  Chronic midline low back pain with left-sided sciatica  Rationale for Evaluation and Treatment: Rehabilitation  SUBJECTIVE:                                                                                                                                                                                             SUBJECTIVE STATEMENT: Patient reports doing well with no new issues. States feeling well overall- still tight but doing his exercises.    Pt accompanied by: significant other  PERTINENT HISTORY: Patient reports fall in April with subsequent report of low back. PMH includes: Anoxic brain injury in 1999 due to prolonged cardiac arrest, with residual mild cognitive impairment- Slow worsening over the years. He was most recently seen here in outpatient PT clinic for chronic neck pain with good results - now here with order for abnormal gait and low back pain.   PAIN:  Are you having pain? Yes: NPRS scale: current=2 /10; worst 8/10; best= 0/10 Pain location: Low back; Neck pain Pain description: Sharp with movement Aggravating factors: Transfers, bending, prolonged sitting/standing, any twisting; lifting Relieving factors: pain meds, rest, heat  PRECAUTIONS: Fall  RED FLAGS: None   WEIGHT BEARING RESTRICTIONS: No  FALLS: Has patient fallen in last 6 months? Yes. Number of falls 3  LIVING ENVIRONMENT: Lives with: lives with their spouse Lives in: House/apartment Stairs: No Has following equipment at home: None  PLOF: Independent  PATIENT GOALS: Patient reports wanting to feel better and have less pain and not fall  OBJECTIVE:  Note: Objective measures were completed at Evaluation unless otherwise noted.  DIAGNOSTIC FINDINGS: None recent  COGNITION: Overall cognitive status: History of cognitive impairments - at  baseline   SENSATION: Light touch: WFL  COORDINATION: Delay reaction time with heel to shin and several other tasks during balance testing today.   EDEMA:  None observed  POSTURE:  rounded shoulders, forward head, and stiffness c-spine  LOWER EXTREMITY ROM:     Active  Right Eval Left Eval  Hip flexion    Hip extension    Hip abduction    Hip adduction    Hip internal rotation    Hip external rotation    Knee flexion    Knee extension    Ankle dorsiflexion    Ankle plantarflexion    Ankle inversion    Ankle eversion     (Blank rows = not tested)  LOWER EXTREMITY MMT:    MMT Right Eval Left Eval  Hip flexion 4 4-  Hip extension    Hip abduction 4 4-  Hip adduction    Hip internal rotation 4 4-  Hip external rotation 4 4-  Knee flexion 4 4-  Knee extension 4 4-  Ankle dorsiflexion 4 4  Ankle plantarflexion 4 4  Ankle inversion 4 4  Ankle eversion 4 4  (Blank rows = not tested)  BED MOBILITY:  Findings: Sit to supine Complete Independence Supine to sit Complete Independence Rolling to Right Complete Independence Rolling to Left Complete Independence  TRANSFERS: Sit to stand: SBA  Assistive device utilized: None     Stand to sit: SBA  Assistive device utilized: None      RAMP:  Not tested  CURB:  Not tested  STAIRS: Findings: Number of Stairs: 4, Height of Stairs: 6   , and Comments: use of railings GAIT: Findings: Gait Characteristics: decreased arm swing- Right, decreased arm swing- Left, decreased step length- Right, decreased step length- Left, decreased stance time- Right, decreased stance time- Left, decreased stride length, and shuffling, Distance walked: <100 feet, Assistive device utilized:None, Level of assistance: SBA, and Comments: shuffling- unsteady- stiff with minimal UE sway and stiff neck/posture  FUNCTIONAL TESTS:   5 times sit to stand: 23.62 sec without UE Support *pain LBP Timed up and go (TUG): 23.12 and 23.56 sec without  AD= 23.34 sec avg 6 minute walk test: to be assessed next 1-2 visits 10 meter walk test: 12.36 sec= 0.81 m/s Berg Balance Scale:  Item Test date: 12/22/2023 Test date:  Test date:   Sitting to standing 4. able to stand without using hands and stabilize independently Insert OPRCBERGREEVAL SmartPhrase at re-test date Insert OPRCBERGREEVAL SmartPhrase at re-test date  2. Standing unsupported 4. able to stand safely for 2 minutes    3. Sitting with back unsupported, feet supported 4. able to sit safely and securely for 2 minutes    4. Standing to sitting 4. sits safely with minimal use of hands    5. Pivot transfer  4. able to transfer safely with minor use of hands    6. Standing unsupported with eyes closed 3. able to stand 10 seconds with supervision    7. Standing unsupported with feet together 3. able to place feet together independently and stand 1 minute with supervision    8. Reaching forward with outstretched arms while standing 3. can reach forward 12 cm (5 inches)    9. Pick up object from the floor from standing 3. able to pick up slipper but needs supervision    10. Turning to look behind over left and right shoulders while standing 2. turns sideways but only maintains balance    11. Turn 360 degrees 2. able to turn 360 degrees safely but slowly    12. Place alternate foot on step or stool while standing unsupported 4. ble to stand independently and safely  and complete 8 steps in 20 seconds    13. Standing unsupported one foot in front 2. able to take small step independently and hold 30 seconds    14. Standing on one leg 1. tries to lift leg unable to hold 3 seconds but remains standing independently.      Total Score 43/56 Total Score /56 Total Score /56    Functional gait assessment: To be assessed next 1-2 visits  PATIENT SURVEYS:  ABC scale 58.13%    SUBJECTIVE Chief complaint:  Low back  History: chronic but worse after fall earlier this year.  Red flags None of the  following: (bowel/bladder changes, saddle paresthesia, personal history of cancer, chills/fever, night sweats, unrelenting pain, first onset of insidious LBP <20 y/o) Negative  Pain location: low back - central L4-S1 Pain: Present 0/10, Best 0/10, Worst 8/10 Pain quality: aching, dull, and stabbing Radiating pain: No  Numbness/Tingling: No 24 hour pain behavior: Pain with transfers and reaching/bending over Aggravating factors: transfers/reaching/bending Easing factors: rest in recliner, Medication How long can you sit: as long as I want How long can you stand: 10 min before pain stops me How long can you walk: unlimited  History of prior back injury, pain, surgery, or therapy: No Follow-up appointment with MD: No Dominant hand: right Imaging: Yes -X ray Falls in the last 6 months: Yes  Occupational demands: Not currently working  Hobbies: gardening; playing cards Goals: To be as painfree   OBJECTIVE     Gait Wide based gait- no device- stiff nature with decreased UE swing  Posture Lumbar lordosis: WNL Iliac crest height: equal bilaterally Lumbar lateral shift: negative   AROM (degrees) R/L (all movements include overpressure unless otherwise stated) Lumbar forward flexion: 25* Lumbar extension: 10* Lumbar lateral flexion: R: fingers to 2 above knee jt line L:  fingers approx 3 above lateral knee jt. Line*  *Indicates pain     Repeated Movements No centralization or peripheralization of symptoms with repeated lumbar extension or flexion.      Sensation intact to light touch bilateral LEs as determined by testing dermatomes L2-S2.    R Palpation Graded on 0-4 scale (0 = no pain, 1 = pain, 2 = pain with wincing/grimacing/flinching, 3 = pain with withdrawal, 4 = unwilling to allow palpation) Location LEFT  RIGHT           Lumbar paraspinals 2 2  Quadratus Lumborum 0 0  Gluteus Maximus 0 0  Gluteus Medius 0 0  Deep hip external rotators 0 0  PSIS 0 0   Fortin's Area (SIJ) 0 0  Greater Trochanter 0 0     Muscle Length Hamstrings: R: 33 degrees L: 22 degrees      Passive Accessory Intervertebral Motion (PAIVM) Pt reports some reproduction of back pain with CPA L1-L5 and UPA bilaterally L1-L5. Generally hypomobile throughout    SPECIAL TESTS Lumbar Radiculopathy and Discogenic:  Slump (SN 83, -LR 0.32): R: Negative L: Negative SLR (SN 92, -LR 0.29): R: Negative L:  Negative      SIJ:  Thigh Thrust (SN 88, -LR 0.18) : R: Negative L: Negative    Functional Tasks Lifting: limited due to pain Deep squat: not tested Sit to stand: slow and guarded  6 Min Walk Test:  Instructed patient to ambulate as quickly and as safely as possible for 6 minutes using LRAD. Patient was allowed to take standing rest breaks without stopping the test, but if the patient required a sitting rest break  the clock would be stopped and the test would be over.  Results: 950 feet (290 meters, Avg speed .81 m/s) using no AD with CGA. Results indicate that the patient has reduced endurance with ambulation compared to age matched norms.  Age Matched Norms: 35-69 yo M: 20 F: 37, 74-79 yo M: 53 F: 471, 2-89 yo M: 417 F: 392 MDC: 58.21 meters (190.98 feet) or 50 meters (ANPTA Core Set of Outcome Measures for Adults with Neurologic Conditions, 2018)                                                                                                                              TREATMENT DATE: 03/08/2024  NMR:   Tandem standing x 30 sec x 3 each side Tandem gait x 10 feet x 5  SLS x 5 trials - Each approx 20 sec  Lateral side step over foam roll x 15 reps each LE  TA:  Sit to stand with abdominal bracing and No UE Support 2 x 10 reps  Self care/Home management:    Review of lumbar ROM- Seated flex- hold 30 sec x 4; Hamstring x 30 sec x 2 ea LE, Seated thoracic stretching- hold 20 -30 sec x 3 each Side.   PATIENT EDUCATION:    Education details:  Exercise technique for HEP  Person educated: Patient and Spouse Education method: Explanation, Demonstration, Tactile cues, and Verbal cues Education comprehension: verbalized understanding, returned demonstration, verbal cues required, tactile cues required, and needs further education  HOME EXERCISE PROGRAM: Access Code: [email or web address] URL: [email or web address] Date: 02/28/2024 Prepared by: Reyes London  Exercises - Standing March with Counter Support  - 3 x weekly - 3 sets - 10 reps - Standing Hip Abduction with Counter Support  - 3 x weekly - 3 sets - 10 reps - Standing Hip Extension with Counter Support  - 3 x weekly - 3 sets - 10 reps - Mini Squat with Counter Support  - 3 x weekly - 3 sets - 10 reps - Step Up  - 3 x weekly - 3 sets - 10 reps     Access Code: [email or web address] URL: [email or web address] Date: 01/19/2024 Prepared by: Reyes London  Exercises - Seated Hamstring Stretch  - 1 x daily - 3 sets - 30 sec hold - Seated Piriformis Stretch with Trunk Bend  - 1 x daily - 3 sets - 30 sec  hold - Seated Piriformis Stretch  - 1 x daily - 3 sets - 30 hold - Seated Lumbar Flexion Stretch  - 1 x daily - 3 sets - 30 hold - Sidelying Thoracic Lumbar Rotation  - 1 x daily - 3 sets - 30 hold - Supine Bridge  - 3 x weekly - 3 sets - 10 reps - Sit to Stand with Arms Crossed  - 3 x weekly - 3 sets - 10 reps       Access Code: [email or web address] URL: [email or web address] Date: 12/29/2023 Prepared by:  Reyes London  Exercises - Supine Single Knee to Chest Stretch  - 1 x daily - 3 sets - 30 hold - Supine Lower Trunk Rotation  - 1 x daily - 3 sets - 30 sec  hold - Seated Hamstring Stretch  - 1 x daily - 3 sets - 30 hold - Supine Piriformis Stretch with Leg Straight  - 1 x daily - 3 sets - 30 hold - Seated 3 Way Exercise Ball Roll Out Stretch  - 1 x daily - 3 sets - 10 reps - Supine Bridge  - 3 x weekly - 3 sets - 10  reps    12/27/2023:Added seated hamstring, Lower trunk rotation, and knee to chest- will update and issue HEP next session. EVAL: Added Tandem standing and SLS- will need to issue handout next visit along with any Low back stretches  GOALS: Goals reviewed with patient? Yes  SHORT TERM GOALS: Target date: 02/02/2024  Pt will be independent with HEP in order to improve strength and balance in order to decrease fall risk and improve function at home. Baseline: EVAL- No formal HEP for balance; 02/09/2024= Patient and wife report independence but still working through breathing when working out.  Goal status: MET  LONG TERM GOALS: Target date: 03/15/2024  1.  Patient will complete five times sit to stand test in < 15 seconds indicating an increased LE strength and improved balance. Baseline: EVAL= 23.62 sec; 02/09/2024= 18.54 sec without UE SUpport Goal status: PROGRESSING  2.  Patient will improve ABC score to >70%  to demonstrate statistically significant improvement in mobility and quality of life as it relates to their confidence in balance.  Baseline: EVAL= 58.13%; 02/09/2024= 51.25% Goal status: ONGOING   3.  Patient will increase Berg Balance score by > 6 points to demonstrate decreased fall risk during functional activities. Baseline: EVAL= 43/56 Goal status: INITIAL   4.   Patient will reduce timed up and go to <11 seconds to reduce fall risk and demonstrate improved transfer/gait ability. Baseline: EVAL= 23.34 sec without AD; 02/09/2024= 15.2 sec without AD Goal status: PROGRESSING  5.   Patient will increase 10 meter walk test to >1.44m/s as to improve gait speed for better community ambulation and to reduce fall risk. Baseline: EVAL=0.81 m/s without AD; 02/09/2024= 0.79 m/s avg without AD; 03/08/2024=0.95 m/s Goal status: PROGRESSING  6.   Patient will increase six minute walk test distance to >1000 for progression to community ambulator and improve gait ability Baseline: EVAL= To be  assessed next visit; 12/27/2023= 950 feet without AD; 02/09/2024= 1145 feet without an AD Goal status: MET  7. Pt will decrease mODI score by at least 13 points in order demonstrate clinically significant reduction in back pain/disability.   Baseline: will issue next visit; 12/29/2023= 42%  Goal status: NEW  8. Pt will decrease worst back pain as reported on NPRS by at least 2 points in order to demonstrate clinically significant reduction in back pain.   Baseline: 12/27/2023= 8/10 worst low back pain; 02/09/2024 = 6/10 LBP  Goal status: NEW 9. Patient will demonstrate 50% improved lumbar AROM for improved transfers/bed mobility, and standing posture.   Baseline: 12/27/2023- very restricted ROM (see table above)   Goal status: NEW  Pt will improve FGA by at least 3 points in order to demonstrate clinically significant improvement in balance and decreased risk for falls.  Baseline: 01/12/2024= 15  Goal Status: NEW   ASSESSMENT:  CLINICAL IMPRESSION: Patient is a 73 y.o. male  who was seen today for physical therapy treatment for chronic back pain. Overall patient progressing and learning stretching/ROM to assist with his chronic tightness most likely associated with his anoxic brain injury. He was challenged with review of balance activities but able to perform well and add to HEP. He has 1 remaining appointment and will practice his HEP for 1 week then return for final session if appropriate.    OBJECTIVE IMPAIRMENTS: Abnormal gait, decreased activity tolerance, decreased balance, decreased cognition, decreased coordination, decreased endurance, decreased mobility, difficulty walking, decreased strength, and pain.   ACTIVITY LIMITATIONS: carrying, lifting, bending, sitting, standing, squatting, stairs, and transfers  PARTICIPATION LIMITATIONS: meal prep, cleaning, laundry, shopping, community activity, and yard work  PERSONAL FACTORS: 1-2 comorbidities: anoxic brain injury; depression are also  affecting patient's functional outcome.   REHAB POTENTIAL: Good  CLINICAL DECISION MAKING: Evolving/moderate complexity  EVALUATION COMPLEXITY: Moderate  PLAN:  PT FREQUENCY: 1-2x/week  PT DURATION: 12 weeks  PLANNED INTERVENTIONS: [id number]- PT Re-evaluation, 97750- Physical Performance Testing, 97110-Therapeutic exercises, 97530- Therapeutic activity, [id number]- Neuromuscular re-education, 97535- Self Care, [id number]- Manual therapy, [id number]- Gait training, [phone]- Orthotic Initial, [phone]- Orthotic/Prosthetic subsequent, [phone]- Canalith repositioning, Patient/Family education, Balance training, Stair training, Taping, Joint mobilization, Joint manipulation, Spinal manipulation, Spinal mobilization, Compression bandaging, Vestibular training, DME instructions, Cryotherapy, and Moist heat  PLAN FOR NEXT SESSION:   Plan for goal reassessment and plan to discharge next visit.    Reyes LOISE London, PT 03/09/2024, 9:25 AM

## 2024-03-15 ENCOUNTER — Ambulatory Visit

## 2024-03-15 DIAGNOSIS — R2689 Other abnormalities of gait and mobility: Secondary | ICD-10-CM

## 2024-03-15 DIAGNOSIS — R262 Difficulty in walking, not elsewhere classified: Secondary | ICD-10-CM | POA: Diagnosis not present

## 2024-03-15 DIAGNOSIS — G8929 Other chronic pain: Secondary | ICD-10-CM

## 2024-03-15 DIAGNOSIS — R2681 Unsteadiness on feet: Secondary | ICD-10-CM | POA: Diagnosis not present

## 2024-03-15 DIAGNOSIS — R278 Other lack of coordination: Secondary | ICD-10-CM | POA: Diagnosis not present

## 2024-03-15 DIAGNOSIS — R269 Unspecified abnormalities of gait and mobility: Secondary | ICD-10-CM

## 2024-03-15 DIAGNOSIS — M6281 Muscle weakness (generalized): Secondary | ICD-10-CM

## 2024-03-15 DIAGNOSIS — M5442 Lumbago with sciatica, left side: Secondary | ICD-10-CM | POA: Diagnosis not present

## 2024-03-15 NOTE — Therapy (Signed)
 OUTPATIENT PHYSICAL THERAPY NEURO AND LOW BACK TREATMENT/PT DISCHARGE SUMMARY   Patient Name: Larry [name] MRN: [id number] DOB:1951-01-16, 73 y.o., male Today's Date: 03/15/2024   PCP: Dr. Arlyss Solian REFERRING PROVIDER: Dr. Arlyss Solian  END OF SESSION:  PT End of Session - 03/15/24 0940     Visit Number 17    Number of Visits 24    Date for PT Re-Evaluation 03/15/24    Progress Note Due on Visit 20    PT Start Time 0934    PT Stop Time 1010    PT Time Calculation (min) 36 min    Equipment Utilized During Treatment Gait belt    Activity Tolerance Patient tolerated treatment well    Behavior During Therapy WFL for tasks assessed/performed                   Past Medical History:  Diagnosis Date   ADD (attention deficit disorder)    Allergy    seasonal flares of perinneal allergies   Cancer (HCC)    melanoma   Cardiac arrest (HCC) 07/1997   aborted   Dementia (HCC)    post resusitative   Depression    Diverticulosis    Family hx of prostate cancer    GERD (gastroesophageal reflux disease)    Headache(784.0)    Hyperlipidemia    Hypothyroidism    ICD (implantable cardiac defibrillator) in place    Idiopathic urticaria    Internal hemorrhoids    Low back pain syndrome    Melanoma in situ of skin of trunk (HCC)    chest   PVC (premature ventricular contraction)    associated with cardiac arrest   Sleep apnea    no c-pap   Tubular adenoma of colon 10/2010   Past Surgical History:  Procedure Laterality Date    implantation x2     CARDIAC DEFIBRILLATOR PLACEMENT  07/01/2010   Explanation of a previously implanted device, pocket revision, and insertion of a new device, and intraoperative defibrillation threshold testing Robyne)   CARDIAC DEFIBRILLATOR REMOVAL  07/05/2002   Defibrillator change Robyne) 3 times changed   CATARACT EXTRACTION W/PHACO Left 02/04/2021   Procedure: CATARACT EXTRACTION PHACO AND INTRAOCULAR LENS PLACEMENT (IOC) LEFT  VIVITY toric LENS 9.21 01:11.6;  Surgeon: Mittie Gaskin, MD;  Location: [facility];  Service: Ophthalmology;  Laterality: Left;  keep patient at 11:30 arrival   CATARACT EXTRACTION W/PHACO Right 03/04/2021   Procedure: CATARACT EXTRACTION PHACO AND INTRAOCULAR LENS PLACEMENT (IOC) RIGHT VIVITY LENS 8.59 01:15.3;  Surgeon: Mittie Gaskin, MD;  Location: [facility];  Service: Ophthalmology;  Laterality: Right;   COLONOSCOPY     HEMORRHOID SURGERY     ICD     TONSILLECTOMY     Patient Active Problem List   Diagnosis Date Noted   Back pain 11/13/2023   Cervical dystonia 06/15/2022   Neck pain 07/02/2021   Gait abnormality 12/16/2020   Nonintractable headache 09/30/2020   Tinnitus 09/09/2019   History of sudden cardiac arrest 01/09/2019   Viral syndrome 09/06/2018   Seizures (HCC) 03/02/2018   Syncope 12/26/2017   Other social stressor 12/26/2017   Health care maintenance 08/14/2017   Family hx of prostate cancer    Allergy    Insomnia 10/20/2015   Advance care planning 03/08/2014   Memory loss 09/14/2013   Hypoxic brain injury (HCC) 09/14/2013   Medicare annual wellness visit, subsequent 02/16/2013   Shortness of breath 08/10/2011   Implantable cardioverter-defibrillator (ICD) in situ 02/03/2009  Hypothyroidism 11/18/2008   CONTACT DERMATITIS&OTHER ECZEMA DUE TO PLANTS 02/03/2008   IDIOPATHIC URTICARIA 02/03/2008   Hypertension 02/03/2008   HLD (hyperlipidemia) 2007-10-31   Attention deficit disorder 31-Oct-2007   PVC/VT non sustained Oct 31, 2007   Sleep apnea 10/31/07   HEADACHE 10/31/07   SUDDEN DEATH-aborted Oct 31, 2007   LOW BACK PAIN SYNDROME 10/11/2007    ONSET DATE: worse since April 2025  REFERRING DIAG:  Z91.81 (ICD-10-CM) - History of fall  R26.9 (ICD-10-CM) - Gait abnormality  M54.9 (ICD-10-CM) - Back pain, unspecified back location, unspecified back pain laterality, unspecified chronicity    THERAPY DIAG:  Muscle weakness  (generalized)  Difficulty in walking, not elsewhere classified  Abnormality of gait and mobility  Other lack of coordination  Unsteadiness on feet  Chronic midline low back pain with left-sided sciatica  Other abnormalities of gait and mobility  Rationale for Evaluation and Treatment: Rehabilitation  SUBJECTIVE:                                                                                                                                                                                             SUBJECTIVE STATEMENT: Patient reports doing well with no new issues. States feeling well overall- still tight but doing his exercises.    Pt accompanied by: significant other  PERTINENT HISTORY: Patient reports fall in April with subsequent report of low back. PMH includes: Anoxic brain injury in 1999 due to prolonged cardiac arrest, with residual mild cognitive impairment- Slow worsening over the years. He was most recently seen here in outpatient PT clinic for chronic neck pain with good results - now here with order for abnormal gait and low back pain.   PAIN:  Are you having pain? Yes: NPRS scale: current=2 /10; worst 8/10; best= 0/10 Pain location: Low back; Neck pain Pain description: Sharp with movement Aggravating factors: Transfers, bending, prolonged sitting/standing, any twisting; lifting Relieving factors: pain meds, rest, heat  PRECAUTIONS: Fall  RED FLAGS: None   WEIGHT BEARING RESTRICTIONS: No  FALLS: Has patient fallen in last 6 months? Yes. Number of falls 3  LIVING ENVIRONMENT: Lives with: lives with their spouse Lives in: House/apartment Stairs: No Has following equipment at home: None  PLOF: Independent  PATIENT GOALS: Patient reports wanting to feel better and have less pain and not fall  OBJECTIVE:  Note: Objective measures were completed at Evaluation unless otherwise noted.  DIAGNOSTIC FINDINGS: None recent  COGNITION: Overall cognitive  status: History of cognitive impairments - at baseline   SENSATION: Light touch: WFL  COORDINATION: Delay reaction time with heel to shin and several other tasks during balance testing today.  EDEMA:  None observed  POSTURE: rounded shoulders, forward head, and stiffness c-spine  LOWER EXTREMITY ROM:     Active  Right Eval Left Eval  Hip flexion    Hip extension    Hip abduction    Hip adduction    Hip internal rotation    Hip external rotation    Knee flexion    Knee extension    Ankle dorsiflexion    Ankle plantarflexion    Ankle inversion    Ankle eversion     (Blank rows = not tested)  LOWER EXTREMITY MMT:    MMT Right Eval Left Eval  Hip flexion 4 4-  Hip extension    Hip abduction 4 4-  Hip adduction    Hip internal rotation 4 4-  Hip external rotation 4 4-  Knee flexion 4 4-  Knee extension 4 4-  Ankle dorsiflexion 4 4  Ankle plantarflexion 4 4  Ankle inversion 4 4  Ankle eversion 4 4  (Blank rows = not tested)  BED MOBILITY:  Findings: Sit to supine Complete Independence Supine to sit Complete Independence Rolling to Right Complete Independence Rolling to Left Complete Independence  TRANSFERS: Sit to stand: SBA  Assistive device utilized: None     Stand to sit: SBA  Assistive device utilized: None      RAMP:  Not tested  CURB:  Not tested  STAIRS: Findings: Number of Stairs: 4, Height of Stairs: 6   , and Comments: use of railings GAIT: Findings: Gait Characteristics: decreased arm swing- Right, decreased arm swing- Left, decreased step length- Right, decreased step length- Left, decreased stance time- Right, decreased stance time- Left, decreased stride length, and shuffling, Distance walked: <100 feet, Assistive device utilized:None, Level of assistance: SBA, and Comments: shuffling- unsteady- stiff with minimal UE sway and stiff neck/posture  FUNCTIONAL TESTS:   5 times sit to stand: 23.62 sec without UE Support *pain LBP Timed  up and go (TUG): 23.12 and 23.56 sec without AD= 23.34 sec avg 6 minute walk test: to be assessed next 1-2 visits 10 meter walk test: 12.36 sec= 0.81 m/s Berg Balance Scale:  Item Test date: 12/22/2023 Test date:  Test date:   Sitting to standing 4. able to stand without using hands and stabilize independently Insert OPRCBERGREEVAL SmartPhrase at re-test date Insert OPRCBERGREEVAL SmartPhrase at re-test date  2. Standing unsupported 4. able to stand safely for 2 minutes    3. Sitting with back unsupported, feet supported 4. able to sit safely and securely for 2 minutes    4. Standing to sitting 4. sits safely with minimal use of hands    5. Pivot transfer  4. able to transfer safely with minor use of hands    6. Standing unsupported with eyes closed 3. able to stand 10 seconds with supervision    7. Standing unsupported with feet together 3. able to place feet together independently and stand 1 minute with supervision    8. Reaching forward with outstretched arms while standing 3. can reach forward 12 cm (5 inches)    9. Pick up object from the floor from standing 3. able to pick up slipper but needs supervision    10. Turning to look behind over left and right shoulders while standing 2. turns sideways but only maintains balance    11. Turn 360 degrees 2. able to turn 360 degrees safely but slowly    12. Place alternate foot on step or stool while standing unsupported 4.  ble to stand independently and safely and complete 8 steps in 20 seconds    13. Standing unsupported one foot in front 2. able to take small step independently and hold 30 seconds    14. Standing on one leg 1. tries to lift leg unable to hold 3 seconds but remains standing independently.      Total Score 43/56 Total Score /56 Total Score /56    Functional gait assessment: To be assessed next 1-2 visits  PATIENT SURVEYS:  ABC scale 58.13%    SUBJECTIVE Chief complaint:  Low back  History: chronic but worse after fall  earlier this year.  Red flags None of the following: (bowel/bladder changes, saddle paresthesia, personal history of cancer, chills/fever, night sweats, unrelenting pain, first onset of insidious LBP <71 y/o) Negative  Pain location: low back - central L4-S1 Pain: Present 0/10, Best 0/10, Worst 8/10 Pain quality: aching, dull, and stabbing Radiating pain: No  Numbness/Tingling: No 24 hour pain behavior: Pain with transfers and reaching/bending over Aggravating factors: transfers/reaching/bending Easing factors: rest in recliner, Medication How long can you sit: as long as I want How long can you stand: 10 min before pain stops me How long can you walk: unlimited  History of prior back injury, pain, surgery, or therapy: No Follow-up appointment with MD: No Dominant hand: right Imaging: Yes -X ray Falls in the last 6 months: Yes  Occupational demands: Not currently working  Hobbies: gardening; playing cards Goals: To be as painfree   OBJECTIVE     Gait Wide based gait- no device- stiff nature with decreased UE swing  Posture Lumbar lordosis: WNL Iliac crest height: equal bilaterally Lumbar lateral shift: negative   AROM (degrees) R/L (all movements include overpressure unless otherwise stated) Lumbar forward flexion: 25* Lumbar extension: 10* Lumbar lateral flexion: R: fingers to 2 above knee jt line L:  fingers approx 3 above lateral knee jt. Line*  *Indicates pain     Repeated Movements No centralization or peripheralization of symptoms with repeated lumbar extension or flexion.      Sensation intact to light touch bilateral LEs as determined by testing dermatomes L2-S2.    R Palpation Graded on 0-4 scale (0 = no pain, 1 = pain, 2 = pain with wincing/grimacing/flinching, 3 = pain with withdrawal, 4 = unwilling to allow palpation) Location LEFT  RIGHT           Lumbar paraspinals 2 2  Quadratus Lumborum 0 0  Gluteus Maximus 0 0  Gluteus Medius 0 0   Deep hip external rotators 0 0  PSIS 0 0  Fortin's Area (SIJ) 0 0  Greater Trochanter 0 0     Muscle Length Hamstrings: R: 33 degrees L: 22 degrees      Passive Accessory Intervertebral Motion (PAIVM) Pt reports some reproduction of back pain with CPA L1-L5 and UPA bilaterally L1-L5. Generally hypomobile throughout    SPECIAL TESTS Lumbar Radiculopathy and Discogenic:  Slump (SN 83, -LR 0.32): R: Negative L: Negative SLR (SN 92, -LR 0.29): R: Negative L:  Negative      SIJ:  Thigh Thrust (SN 88, -LR 0.18) : R: Negative L: Negative    Functional Tasks Lifting: limited due to pain Deep squat: not tested Sit to stand: slow and guarded  6 Min Walk Test:  Instructed patient to ambulate as quickly and as safely as possible for 6 minutes using LRAD. Patient was allowed to take standing rest breaks without stopping the test, but if the  patient required a sitting rest break the clock would be stopped and the test would be over.  Results: 950 feet (290 meters, Avg speed .81 m/s) using no AD with CGA. Results indicate that the patient has reduced endurance with ambulation compared to age matched norms.  Age Matched Norms: 67-69 yo M: 47 F: 18, 69-79 yo M: 57 F: 471, 79-89 yo M: 417 F: 392 MDC: 58.21 meters (190.98 feet) or 50 meters (ANPTA Core Set of Outcome Measures for Adults with Neurologic Conditions, 2018)                                                                                                                              TREATMENT DATE: 03/15/2024  Physical therapy treatment session today consisted of completing assessment of goals and administration of testing as demonstrated and documented in flow sheet, treatment, and goals section of this note. Addition treatments may be found below.   AROM (degrees) R/L (all movements include overpressure unless otherwise stated) Lumbar forward flexion: 44 Lumbar extension: 20* Lumbar lateral flexion: R: fingers to 2  above knee jt line L:  fingers to lateral knee jt. Line*  *Indicates pain- Most painful with R Lumbar lateral flex   [facility] PT Assessment - 03/15/24 0952       Functional Gait  Assessment   Gait assessed  Yes    Gait Level Surface Walks 20 ft in less than 5.5 sec, no assistive devices, good speed, no evidence for imbalance, normal gait pattern, deviates no more than 6 in outside of the 12 in walkway width.    Change in Gait Speed Able to smoothly change walking speed without loss of balance or gait deviation. Deviate no more than 6 in outside of the 12 in walkway width.    Gait with Horizontal Head Turns Performs head turns smoothly with slight change in gait velocity (eg, minor disruption to smooth gait path), deviates 6-10 in outside 12 in walkway width, or uses an assistive device.    Gait with Vertical Head Turns Performs task with slight change in gait velocity (eg, minor disruption to smooth gait path), deviates 6 - 10 in outside 12 in walkway width or uses assistive device    Gait and Pivot Turn Pivot turns safely within 3 sec and stops quickly with no loss of balance.    Step Over Obstacle Is able to step over 2 stacked shoe boxes taped together (9 in total height) without changing gait speed. No evidence of imbalance.    Gait with Narrow Base of Support Ambulates 4-7 steps.    Gait with Eyes Closed Walks 20 ft, slow speed, abnormal gait pattern, evidence for imbalance, deviates 10-15 in outside 12 in walkway width. Requires more than 9 sec to ambulate 20 ft.    Ambulating Backwards Walks 20 ft, uses assistive device, slower speed, mild gait deviations, deviates 6-10 in outside 12 in walkway width.    Steps Alternating feet,  no rail.    Total Score 23            MOD Oswestry: 24%  The Activities-Specific Balance Confidence (ABC) Scale 0% 10 20 30  40 50 60 70 80 90 100% No confidence<->completely confident  "How confident are you that you will not lose your balance or become  unsteady when you . . .   Date tested 03/15/2024  Walk around the house 80%  2. Walk up or down stairs 80%  3. Bend over and pick up a slipper from in front of a closet floor 50%  4. Reach for a small can off a shelf at eye level 100%  5. Stand on tip toes and reach for something above your head 90%  6. Stand on a chair and reach for something 50%  7. Sweep the floor 100%  8. Walk outside the house to a car parked in the driveway 100%  9. Get into or out of a car 100%  10. Walk across a parking lot to the mall 90%  11. Walk up or down a ramp 90%  12. Walk in a crowded mall where people rapidly walk past you 90%  13. Are bumped into by people as you walk through the mall 50%  14. Step onto or off of an escalator while you are holding onto the railing 90%  15. Step onto or off an escalator while holding onto parcels such that you cannot hold onto the railing 70%  16. Walk outside on icy sidewalks 10%  Total: #/16 77.5%      PATIENT EDUCATION:    Education details: Exercise technique for HEP  Person educated: Patient and Spouse Education method: Explanation, Demonstration, Tactile cues, and Verbal cues Education comprehension: verbalized understanding, returned demonstration, verbal cues required, tactile cues required, and needs further education  HOME EXERCISE PROGRAM: Access Code: [email or web address] URL: [email or web address] Date: 02/28/2024 Prepared by: Reyes London  Exercises - Standing March with Counter Support  - 3 x weekly - 3 sets - 10 reps - Standing Hip Abduction with Counter Support  - 3 x weekly - 3 sets - 10 reps - Standing Hip Extension with Counter Support  - 3 x weekly - 3 sets - 10 reps - Mini Squat with Counter Support  - 3 x weekly - 3 sets - 10 reps - Step Up  - 3 x weekly - 3 sets - 10 reps     Access Code: [email or web address] URL: [email or web address] Date: 01/19/2024 Prepared by: Reyes London  Exercises - Seated Hamstring  Stretch  - 1 x daily - 3 sets - 30 sec hold - Seated Piriformis Stretch with Trunk Bend  - 1 x daily - 3 sets - 30 sec  hold - Seated Piriformis Stretch  - 1 x daily - 3 sets - 30 hold - Seated Lumbar Flexion Stretch  - 1 x daily - 3 sets - 30 hold - Sidelying Thoracic Lumbar Rotation  - 1 x daily - 3 sets - 30 hold - Supine Bridge  - 3 x weekly - 3 sets - 10 reps - Sit to Stand with Arms Crossed  - 3 x weekly - 3 sets - 10 reps       Access Code: [email or web address] URL: [email or web address] Date: 12/29/2023 Prepared by: Reyes London  Exercises - Supine Single Knee to Chest Stretch  - 1 x daily - 3 sets - 30 hold - Supine Lower Trunk Rotation  - 1 x daily - 3 sets - 30  sec  hold - Seated Hamstring Stretch  - 1 x daily - 3 sets - 30 hold - Supine Piriformis Stretch with Leg Straight  - 1 x daily - 3 sets - 30 hold - Seated 3 Way Exercise Ball Roll Out Stretch  - 1 x daily - 3 sets - 10 reps - Supine Bridge  - 3 x weekly - 3 sets - 10 reps    12/27/2023:Added seated hamstring, Lower trunk rotation, and knee to chest- will update and issue HEP next session. EVAL: Added Tandem standing and SLS- will need to issue handout next visit along with any Low back stretches  GOALS: Goals reviewed with patient? Yes  SHORT TERM GOALS: Target date: 02/02/2024  Pt will be independent with HEP in order to improve strength and balance in order to decrease fall risk and improve function at home. Baseline: EVAL- No formal HEP for balance; 02/09/2024= Patient and wife report independence but still working through breathing when working out.  Goal status: MET  LONG TERM GOALS: Target date: 03/15/2024  1.  Patient will complete five times sit to stand test in < 15 seconds indicating an increased LE strength and improved balance. Baseline: EVAL= 23.62 sec; 02/09/2024= 18.54 sec without UE SUpport Goal status: PROGRESSING  2.  Patient will improve ABC score to >70%  to demonstrate  statistically significant improvement in mobility and quality of life as it relates to their confidence in balance.  Baseline: EVAL= 58.13%; 02/09/2024= 51.25%; 77.5% Goal status: MET   3.  Patient will increase Berg Balance score by > 6 points to demonstrate decreased fall risk during functional activities. Baseline: EVAL= 43/56; 03/15/2024= Did not re-assess due to time.  Goal status: INITIAL   4.   Patient will reduce timed up and go to <11 seconds to reduce fall risk and demonstrate improved transfer/gait ability. Baseline: EVAL= 23.34 sec without AD; 02/09/2024= 15.2 sec without AD; 03/15/2024= 12.1 sec Goal status: PROGRESSING  5.   Patient will increase 10 meter walk test to >1.90m/s as to improve gait speed for better community ambulation and to reduce fall risk. Baseline: EVAL=0.81 m/s without AD; 02/09/2024= 0.79 m/s avg without AD; 03/08/2024=0.95 m/s; 03/15/2024= 1.0 m/s Goal status: MET  6.   Patient will increase six minute walk test distance to >1000 for progression to community ambulator and improve gait ability Baseline: EVAL= To be assessed next visit; 12/27/2023= 950 feet without AD; 02/09/2024= 1145 feet without an AD Goal status: MET  7. Pt will decrease mODI score by at least 13 points in order demonstrate clinically significant reduction in back pain/disability.   Baseline: will issue next visit; 12/29/2023= 42%; 03/15/2024= 24%  Goal status: NEW  8. Pt will decrease worst back pain as reported on NPRS by at least 2 points in order to demonstrate clinically significant reduction in back pain.   Baseline: 12/27/2023= 8/10 worst low back pain; 02/09/2024 = 6/10 LBP; 03/15/2024= 5/10 worst back pain  Goal status: MET 9. Patient will demonstrate 50% improved lumbar AROM for improved transfers/bed mobility, and standing posture.   Baseline: 12/27/2023- very restricted ROM (see table above); 03/15/2024= (see table above)   Goal status: MET  Pt will improve FGA by at least 3 points in  order to demonstrate clinically significant improvement in balance and decreased risk for falls.  Baseline: 01/12/2024= 15; 03/15/2024= 23/30  Goal Status: MET  ASSESSMENT:  CLINICAL IMPRESSION: Patient is a 73 y.o. male who was seen today for physical therapy treatment/discharge for chronic  back pain. Patient continues to have chronic back pain at times- up to 5/10. He has learned many strategies to assist with his management of chronic pain and doing well with current HEP for ROM/Flexibility. He has progressed with balance and report of back pain- from 8/10 down to 5/10 reported. He is very versed  in his HEP to manage his chronic tightness and for balance. He has met majority of all mobility goals and appropriate for discharge today with plan for him to continue at home with support of wife. Patient is in agreement with plan of care.    OBJECTIVE IMPAIRMENTS: Abnormal gait, decreased activity tolerance, decreased balance, decreased cognition, decreased coordination, decreased endurance, decreased mobility, difficulty walking, decreased strength, and pain.   ACTIVITY LIMITATIONS: carrying, lifting, bending, sitting, standing, squatting, stairs, and transfers  PARTICIPATION LIMITATIONS: meal prep, cleaning, laundry, shopping, community activity, and yard work  PERSONAL FACTORS: 1-2 comorbidities: anoxic brain injury; depression are also affecting patient's functional outcome.   REHAB POTENTIAL: Good  CLINICAL DECISION MAKING: Evolving/moderate complexity  EVALUATION COMPLEXITY: Moderate  PLAN:  PT FREQUENCY: 1-2x/week  PT DURATION: 12 weeks  PLANNED INTERVENTIONS: 97164- PT Re-evaluation, 97750- Physical Performance Testing, 97110-Therapeutic exercises, 97530- Therapeutic activity, [id number]- Neuromuscular re-education, 97535- Self Care, [id number]- Manual therapy, [id number]- Gait training, [phone]- Orthotic Initial, [phone]- Orthotic/Prosthetic subsequent, [phone]- Canalith repositioning, Patient/Family  education, Balance training, Stair training, Taping, Joint mobilization, Joint manipulation, Spinal manipulation, Spinal mobilization, Compression bandaging, Vestibular training, DME instructions, Cryotherapy, and Moist heat  PLAN FOR NEXT SESSION:   Discharge patient today.     Reyes LOISE London, PT 03/15/2024, 5:02 PM

## 2024-03-20 ENCOUNTER — Ambulatory Visit

## 2024-03-21 ENCOUNTER — Encounter: Payer: Self-pay | Admitting: Cardiology

## 2024-03-21 ENCOUNTER — Ambulatory Visit: Attending: Cardiology | Admitting: Cardiology

## 2024-03-21 VITALS — BP 140/68 | HR 75 | Ht 69.0 in | Wt 178.2 lb

## 2024-03-21 DIAGNOSIS — Z8674 Personal history of sudden cardiac arrest: Secondary | ICD-10-CM | POA: Diagnosis not present

## 2024-03-21 DIAGNOSIS — Z95810 Presence of automatic (implantable) cardiac defibrillator: Secondary | ICD-10-CM | POA: Diagnosis not present

## 2024-03-21 DIAGNOSIS — I493 Ventricular premature depolarization: Secondary | ICD-10-CM

## 2024-03-21 LAB — CUP PACEART INCLINIC DEVICE CHECK
Date Time Interrogation Session: 20250917143349
HighPow Impedance: 43 Ohm
HighPow Impedance: 66 Ohm
Implantable Lead Connection Status: 753985
Implantable Lead Implant Date: 19990204
Implantable Lead Location: 753860
Implantable Lead Model: 135
Implantable Lead Serial Number: 301213
Implantable Pulse Generator Implant Date: 20111228
Lead Channel Impedance Value: 998 Ohm
Lead Channel Pacing Threshold Amplitude: 2.2 V
Lead Channel Pacing Threshold Pulse Width: 1 ms
Lead Channel Sensing Intrinsic Amplitude: 10 mV
Lead Channel Setting Pacing Amplitude: 2.4 V
Lead Channel Setting Pacing Pulse Width: 1 ms
Lead Channel Setting Sensing Sensitivity: 0.4 mV
Pulse Gen Serial Number: 277857

## 2024-03-21 NOTE — Patient Instructions (Signed)
 Medication Instructions:  Your physician recommends that you continue on your current medications as directed. Please refer to the Current Medication list given to you today.   *If you need a refill on your cardiac medications before your next appointment, please call your pharmacy*  Lab Work: No labs ordered today  If you have labs (blood work) drawn today and your tests are completely normal, you will receive your results only by: MyChart Message (if you have MyChart) OR A paper copy in the mail If you have any lab test that is abnormal or we need to change your treatment, we will call you to review the results.  Testing/Procedures: No test ordered today   Follow-Up: At [facility], you and your health needs are our priority.  As part of our continuing mission to provide you with exceptional heart care, our providers are all part of one team.  This team includes your primary Cardiologist (physician) and Advanced Practice Providers or APPs (Physician Assistants and Nurse Practitioners) who all work together to provide you with the care you need, when you need it.  Your next appointment:   12 month(s)  Provider:   Ole Holts, MD   We recommend signing up for the patient portal called MyChart.  Sign up information is provided on this After Visit Summary.  MyChart is used to connect with patients for Virtual Visits (Telemedicine).  Patients are able to view lab/test results, encounter notes, upcoming appointments, etc.  Non-urgent messages can be sent to your provider as well.   To learn more about what you can do with MyChart, go to [email or web address].

## 2024-03-21 NOTE — Progress Notes (Signed)
 Electrophysiology Clinic Note    Date:  03/21/2024  Patient ID:  Larry [name], Larry [name] August 11, 1950, MRN [id number] PCP:  Cleatus Arlyss RAMAN, MD  Cardiologist:  Ozell Fell, MD   Electrophysiologist:  OLE ONEIDA HOLTS, MD  Electrophysiology APP:  Gehrig Patras, NP    Discussed the use of AI scribe software for clinical note transcription with the patient, who gave verbal consent to proceed.   Patient Profile    Chief Complaint: routine ICD follow-up  History of Present Illness: Larry [name] is a 73 y.o. male with PMH notable for aborted cardiac arrest s/p ICD, PVC, HTN, HLD, hypothyroid; seen today for OLE ONEIDA HOLTS, MD (previously Dr. Fernande) for routine electrophysiology followup.   He last saw Dr. Fernande 08/2022 where he was doing well.   On follow-up today, he feels very well without cardiac complaints. He denies chest pain, chest pressure, palpitations. No SOB, edema or presyncope.  He recently completed PT for balance, not great at continuing exercises on his own.   His wife joins for appt.    Arrhythmia/Device History Bos Sci single chamber ICD, imp 1999; Dx - cardiac arrest  - gen change 2011    ROS:  Please see the history of present illness. All other systems are reviewed and otherwise negative.    Physical Exam    VS:  BP (!) 140/68 (BP Location: Left Arm, Patient Position: Sitting, Cuff Size: Normal)   Pulse 75   Ht 5' 9 (1.753 m)   Wt 178 lb 3.2 oz (80.8 kg)   SpO2 97%   BMI 26.32 kg/m  BMI: Body mass index is 26.32 kg/m.      Wt Readings from Last 3 Encounters:  03/21/24 178 lb 3.2 oz (80.8 kg)  11/11/23 179 lb 6.4 oz (81.4 kg)  10/11/23 172 lb (78 kg)     GEN- The patient is well appearing, alert and oriented x 3 today.   Lungs- Clear to ausculation bilaterally, normal work of breathing.  Heart- Regular rate and rhythm with rare ectopy, no murmurs, rubs or gallops Extremities- No peripheral edema, warm, dry Skin-  device pocket  well-healed, no tethering   Device interrogation done today and reviewed by myself:  Battery < 3mon until ERI Lead thresholds, impedence, sensing stable  Very brief NSVT episodes Low VP No changes made today   Studies Reviewed   Previous EP, cardiology notes.    EKG is ordered. Personal review of EKG from today shows:    EKG Interpretation Date/Time:  Wednesday March 21 2024 13:40:32 EDT Ventricular Rate:  76 PR Interval:  144 QRS Duration:  98 QT Interval:  408 QTC Calculation: 459 R Axis:   16  Text Interpretation: Normal sinus rhythm Normal ECG Confirmed by Paxson Harrower [phone]) on 03/21/2024 1:44:29 PM    Rhythm strip - SR with rare PVC  TTE, 08/17/2011 - Left ventricle: The cavity size was normal. Wall thickness    was normal. The estimated ejection fraction was 60%. Wall    motion was normal; there were no regional wall motion    abnormalities.  - Right ventricle: The cavity size was normal. Pacer wire or    catheter noted in right ventricle. Systolic function was    normal.      Assessment and Plan     #) aborted cardiac arrest #) ICD in situ VT quiescent on ICD Less than 3 mon until ERI Lead measurements stable We discussed generator replacement procedure with Dr.  Cindie, patient prefers to have procedure at [facility] if possible He is ok to meet Dr. Cindie on the day of procedure Will need BMP, CBC within 1 month of procedure once he reaches ERI  #) PVCs Low burden via rhythm strip Continue 25mg  toprol  daily      Current medicines are reviewed at length with the patient today.   The patient does not have concerns regarding his medicines.  The following changes were made today:  none  Labs/ tests ordered today include:  Orders Placed This Encounter  Procedures   EKG 12-Lead     Disposition: Follow up with Dr. Cindie or EP APP in 12 months, sooner as needed based on ICD battery longevity   Signed, Demaree Liberto, NP  03/21/24  2:24 PM   Electrophysiology [facility]

## 2024-03-22 ENCOUNTER — Ambulatory Visit

## 2024-03-22 ENCOUNTER — Ambulatory Visit: Payer: Self-pay | Admitting: Cardiology

## 2024-03-27 ENCOUNTER — Ambulatory Visit: Admitting: Physical Therapy

## 2024-03-29 ENCOUNTER — Ambulatory Visit

## 2024-04-03 ENCOUNTER — Other Ambulatory Visit: Payer: Self-pay | Admitting: Neurology

## 2024-04-03 ENCOUNTER — Ambulatory Visit

## 2024-04-03 DIAGNOSIS — R413 Other amnesia: Secondary | ICD-10-CM

## 2024-04-05 ENCOUNTER — Ambulatory Visit

## 2024-04-10 ENCOUNTER — Ambulatory Visit

## 2024-04-10 ENCOUNTER — Telehealth: Payer: Self-pay | Admitting: Neurology

## 2024-04-10 NOTE — Telephone Encounter (Signed)
 Submitted auth request via CMM to switch pt to SP, status is pending. Key: [id number]

## 2024-04-10 NOTE — Progress Notes (Signed)
 Remote ICD Transmission

## 2024-04-12 ENCOUNTER — Ambulatory Visit: Admitting: Physical Therapy

## 2024-04-16 DIAGNOSIS — Z85820 Personal history of malignant melanoma of skin: Secondary | ICD-10-CM | POA: Diagnosis not present

## 2024-04-16 DIAGNOSIS — Z1283 Encounter for screening for malignant neoplasm of skin: Secondary | ICD-10-CM | POA: Diagnosis not present

## 2024-04-16 DIAGNOSIS — X32XXXD Exposure to sunlight, subsequent encounter: Secondary | ICD-10-CM | POA: Diagnosis not present

## 2024-04-16 DIAGNOSIS — Z08 Encounter for follow-up examination after completed treatment for malignant neoplasm: Secondary | ICD-10-CM | POA: Diagnosis not present

## 2024-04-16 DIAGNOSIS — L821 Other seborrheic keratosis: Secondary | ICD-10-CM | POA: Diagnosis not present

## 2024-04-16 DIAGNOSIS — L570 Actinic keratosis: Secondary | ICD-10-CM | POA: Diagnosis not present

## 2024-04-16 DIAGNOSIS — D225 Melanocytic nevi of trunk: Secondary | ICD-10-CM | POA: Diagnosis not present

## 2024-04-16 MED ORDER — BOTOX 100 UNITS IJ SOLR: 100.0000 [IU] | Freq: Once | INTRAMUSCULAR | 3 refills | Status: AC

## 2024-04-16 NOTE — Telephone Encounter (Signed)
 Received approval, please send Botox  rx for 100 units every 12 weeks to [facility].  Auth#: [id number] (04/10/24-07/11/24)

## 2024-04-16 NOTE — Addendum Note (Signed)
 Addended by: ONEITA NEVELYN BRAVO on: 04/16/2024 01:48 PM   Modules accepted: Orders

## 2024-04-16 NOTE — Telephone Encounter (Signed)
 refilled

## 2024-04-17 ENCOUNTER — Ambulatory Visit

## 2024-04-18 ENCOUNTER — Encounter

## 2024-04-19 ENCOUNTER — Ambulatory Visit

## 2024-04-23 DIAGNOSIS — I10 Essential (primary) hypertension: Secondary | ICD-10-CM | POA: Diagnosis not present

## 2024-04-23 DIAGNOSIS — R0602 Shortness of breath: Secondary | ICD-10-CM | POA: Diagnosis not present

## 2024-04-23 DIAGNOSIS — J209 Acute bronchitis, unspecified: Secondary | ICD-10-CM | POA: Diagnosis not present

## 2024-04-23 DIAGNOSIS — R062 Wheezing: Secondary | ICD-10-CM | POA: Diagnosis not present

## 2024-04-24 ENCOUNTER — Ambulatory Visit

## 2024-04-24 DIAGNOSIS — Z8674 Personal history of sudden cardiac arrest: Secondary | ICD-10-CM

## 2024-04-26 ENCOUNTER — Ambulatory Visit

## 2024-04-26 LAB — CUP PACEART REMOTE DEVICE CHECK
Battery Remaining Longevity: 3 mo — CL
Battery Remaining Percentage: 1 %
Brady Statistic RV Percent Paced: 0 %
Date Time Interrogation Session: 20251021061200
HighPow Impedance: 66 Ohm
Implantable Lead Connection Status: 753985
Implantable Lead Implant Date: 19990204
Implantable Lead Location: 753860
Implantable Lead Model: 135
Implantable Lead Serial Number: 301213
Implantable Pulse Generator Implant Date: 20111228
Lead Channel Impedance Value: 1002 Ohm
Lead Channel Pacing Threshold Amplitude: 2.2 V
Lead Channel Pacing Threshold Pulse Width: 1 ms
Lead Channel Setting Pacing Amplitude: 2.4 V
Lead Channel Setting Pacing Pulse Width: 1 ms
Lead Channel Setting Sensing Sensitivity: 0.4 mV
Pulse Gen Serial Number: 277857

## 2024-04-27 NOTE — Progress Notes (Signed)
 Remote PPM Transmission

## 2024-04-30 NOTE — Telephone Encounter (Signed)
 Called pt and spoke with him and his wife, they will call [facility] with consent.

## 2024-05-01 ENCOUNTER — Ambulatory Visit

## 2024-05-02 ENCOUNTER — Ambulatory Visit: Admitting: Neurology

## 2024-05-03 ENCOUNTER — Ambulatory Visit

## 2024-05-07 ENCOUNTER — Other Ambulatory Visit: Payer: Self-pay | Admitting: Cardiology

## 2024-05-07 ENCOUNTER — Ambulatory Visit: Admitting: Neurology

## 2024-05-08 ENCOUNTER — Ambulatory Visit

## 2024-05-09 ENCOUNTER — Other Ambulatory Visit: Payer: Self-pay | Admitting: Cardiology

## 2024-05-09 MED ORDER — DILTIAZEM HCL ER COATED BEADS 120 MG PO CP24: 120.0000 mg | ORAL_CAPSULE | Freq: Every day | ORAL | 2 refills | Status: AC

## 2024-05-10 ENCOUNTER — Ambulatory Visit

## 2024-05-15 ENCOUNTER — Ambulatory Visit

## 2024-05-16 ENCOUNTER — Telehealth: Payer: Self-pay | Admitting: Neurology

## 2024-05-16 NOTE — Telephone Encounter (Signed)
 Wife reports she has been informed of a $575.00 copay they normally pay 100-175.00, she would like a call to discuss.

## 2024-05-17 ENCOUNTER — Ambulatory Visit

## 2024-05-21 ENCOUNTER — Encounter

## 2024-05-25 ENCOUNTER — Ambulatory Visit: Attending: Cardiology

## 2024-05-25 ENCOUNTER — Telehealth: Payer: Self-pay | Admitting: Cardiology

## 2024-05-25 NOTE — Telephone Encounter (Signed)
 Patient is concerned that he received white light transmission error and he had to follow-up with [employer].  Patient noted the transmission was sent.

## 2024-05-28 ENCOUNTER — Ambulatory Visit: Payer: Self-pay | Admitting: Cardiology

## 2024-05-28 LAB — CUP PACEART REMOTE DEVICE CHECK
Battery Remaining Longevity: 3 mo — CL
Battery Remaining Percentage: 1 %
Brady Statistic RV Percent Paced: 0 %
Date Time Interrogation Session: 20251121090600
HighPow Impedance: 65 Ohm
Implantable Lead Connection Status: 753985
Implantable Lead Implant Date: 19990204
Implantable Lead Location: 753860
Implantable Lead Model: 135
Implantable Lead Serial Number: 301213
Implantable Pulse Generator Implant Date: 20111228
Lead Channel Impedance Value: 992 Ohm
Lead Channel Pacing Threshold Amplitude: 2.2 V
Lead Channel Pacing Threshold Pulse Width: 1 ms
Lead Channel Setting Pacing Amplitude: 2.4 V
Lead Channel Setting Pacing Pulse Width: 1 ms
Lead Channel Setting Sensing Sensitivity: 0.4 mV
Pulse Gen Serial Number: 277857

## 2024-06-06 NOTE — Telephone Encounter (Signed)
 Pt called to request to speak to nurse about different option and have how to move forward. Pt stated he is paying out of pocket. However , he is requesting to speak to Nurse about other options

## 2024-06-08 ENCOUNTER — Other Ambulatory Visit: Payer: Self-pay | Admitting: Family Medicine

## 2024-06-08 NOTE — Telephone Encounter (Signed)
 Pls associate dx code to rx.   Name of Medication:  Adderall Name of Pharmacy:  [facility] [place] Last [place] or Written Date and Quantity:  02/24/24, #180 Last Office Visit and Type:  11/11/23, annual exam Next Office Visit and Type:  none Last Controlled Substance Agreement Date:  04/18/15 Last UDS:  04/18/15

## 2024-06-11 ENCOUNTER — Telehealth: Payer: Self-pay | Admitting: Neurology

## 2024-06-11 NOTE — Telephone Encounter (Signed)
 Wife reports pt unable to make appointment

## 2024-06-12 NOTE — Telephone Encounter (Signed)
 Please let patient know, it is ok to keep buy and billl

## 2024-06-13 ENCOUNTER — Ambulatory Visit: Admitting: Neurology

## 2024-06-13 NOTE — Telephone Encounter (Signed)
 Auth#: [id number] (06/13/24-06/13/25)

## 2024-06-13 NOTE — Telephone Encounter (Signed)
 Called and spoke with pt's wife, r/s Botox  for 07/11/24 @ 3:00 pm.

## 2024-06-21 ENCOUNTER — Telehealth: Payer: Self-pay

## 2024-06-21 ENCOUNTER — Other Ambulatory Visit: Payer: Self-pay

## 2024-06-21 ENCOUNTER — Encounter

## 2024-06-21 DIAGNOSIS — Z95810 Presence of automatic (implantable) cardiac defibrillator: Secondary | ICD-10-CM

## 2024-06-21 DIAGNOSIS — Z8674 Personal history of sudden cardiac arrest: Secondary | ICD-10-CM

## 2024-06-21 NOTE — Telephone Encounter (Signed)
 Spoke with the patient and his wife. He has been scheduled for a generator change with Dr. Kennyth on 1/27 at [facility]. He will go by the [place] office for lab work and to pick up his scrub/instruction letter the second week of January. Advised to call back with any further questions or concerns.

## 2024-06-21 NOTE — Telephone Encounter (Signed)
 Alert remote transmission:  Explant indicator reached on Jun 20, 2024 09:44.  Route to triage Follow up as scheduled.

## 2024-06-25 ENCOUNTER — Ambulatory Visit

## 2024-06-25 DIAGNOSIS — Z8674 Personal history of sudden cardiac arrest: Secondary | ICD-10-CM | POA: Diagnosis not present

## 2024-06-26 LAB — CUP PACEART REMOTE DEVICE CHECK
Brady Statistic RV Percent Paced: 0 %
Date Time Interrogation Session: 20251222033500
HighPow Impedance: 64 Ohm
Implantable Lead Connection Status: 753985
Implantable Lead Implant Date: 19990204
Implantable Lead Location: 753860
Implantable Lead Model: 135
Implantable Lead Serial Number: 301213
Implantable Pulse Generator Implant Date: 20111228
Lead Channel Impedance Value: 1020 Ohm
Lead Channel Pacing Threshold Amplitude: 2.2 V
Lead Channel Pacing Threshold Pulse Width: 1 ms
Lead Channel Setting Pacing Amplitude: 2.4 V
Lead Channel Setting Pacing Pulse Width: 1 ms
Lead Channel Setting Sensing Sensitivity: 0.4 mV
Pulse Gen Serial Number: 277857

## 2024-06-27 NOTE — Progress Notes (Signed)
 Remote PPM Transmission

## 2024-06-29 ENCOUNTER — Telehealth: Payer: Self-pay

## 2024-06-29 NOTE — Telephone Encounter (Signed)
-----   Message from Nurse Doreatha BROCKS, RN sent at 06/21/2024  1:38 PM EST ----- Regarding: 07/31/24 ICD gen change Precert:  MD: Kennyth Type of implant: ICD gen change Device manufacturer: [employer] Diagnosis: ERI CPT code: ICD change out, dual - 66736 C-code(s), including quantity (if indicated):  Procedure scheduled (date/time): 1/27 at 1:30pm  Procedure:  Scrub given? No - pt to pick up in [facility] Medication instructions: take all AM meds Message sent to CVRR? No Added to calendar? Yes Orders entered? Yes Letter complete? No, >30 days before procedure Scheduled with cath lab? Yes Labs ordered (CBC, BMET, PT/INR if on warfarin)? Yes Dye allergy? No Pre-meds ordered and instructions given? No, not needed Letter method: Patient pick-up in [place] Special instructions: n/a H&P: 03/21/24  Follow-up:  Cassie/Angel, please schedule Routine.  Covering RN:  Please send this message to [employer], EP scheduler, EP Scheduling pool, and EP [employer].

## 2024-07-01 ENCOUNTER — Ambulatory Visit: Payer: Self-pay | Admitting: Cardiology

## 2024-07-03 ENCOUNTER — Telehealth: Payer: Self-pay

## 2024-07-03 NOTE — Telephone Encounter (Signed)
 Remote transmission received. ERI was reached 06/20/24. No other acute alerts triggered on device other than battery. Pt/wife was appreciative of call.

## 2024-07-03 NOTE — Telephone Encounter (Signed)
 Spoke with pt and wife and let them know the device will beep 16 times every 6 hours until the gen change. Pt was not able to come in today to get it turned off, decided to wait until gen change now that they know its not life threatening.

## 2024-07-03 NOTE — Telephone Encounter (Signed)
 Pt wife called in stating that pt device is beeping longer than normal and is concerned. Pt is scheduled for a gen change 07/31/2024. Pt is sending in a transmission to see if anything abnormal is seen

## 2024-07-09 ENCOUNTER — Ambulatory Visit

## 2024-07-09 LAB — CBC WITH DIFFERENTIAL/PLATELET
Basophils Absolute: 0.1 x10E3/uL (ref 0.0–0.2)
Basos: 1 %
EOS (ABSOLUTE): 0.1 x10E3/uL (ref 0.0–0.4)
Eos: 2 %
Hematocrit: 43.6 % (ref 37.5–51.0)
Hemoglobin: 14.7 g/dL (ref 13.0–17.7)
Immature Grans (Abs): 0 x10E3/uL (ref 0.0–0.1)
Immature Granulocytes: 0 %
Lymphocytes Absolute: 3.1 x10E3/uL (ref 0.7–3.1)
Lymphs: 40 %
MCH: 31.7 pg (ref 26.6–33.0)
MCHC: 33.7 g/dL (ref 31.5–35.7)
MCV: 94 fL (ref 79–97)
Monocytes Absolute: 0.9 x10E3/uL (ref 0.1–0.9)
Monocytes: 11 %
Neutrophils Absolute: 3.5 x10E3/uL (ref 1.4–7.0)
Neutrophils: 46 %
Platelets: 335 x10E3/uL (ref 150–450)
RBC: 4.63 x10E6/uL (ref 4.14–5.80)
RDW: 12.2 % (ref 11.6–15.4)
WBC: 7.7 x10E3/uL (ref 3.4–10.8)

## 2024-07-09 LAB — BASIC METABOLIC PANEL WITH GFR
BUN/Creatinine Ratio: 12 (ref 10–24)
BUN: 11 mg/dL (ref 8–27)
CO2: 26 mmol/L (ref 20–29)
Calcium: 9.8 mg/dL (ref 8.6–10.2)
Chloride: 101 mmol/L (ref 96–106)
Creatinine, Ser: 0.95 mg/dL (ref 0.76–1.27)
Glucose: 117 mg/dL — ABNORMAL HIGH (ref 70–99)
Potassium: 4.6 mmol/L (ref 3.5–5.2)
Sodium: 141 mmol/L (ref 134–144)
eGFR: 86 mL/min/1.73

## 2024-07-09 NOTE — Telephone Encounter (Signed)
 Patient was scheduled to be seen in device clinic today to turn off ERI alarm, but called and wanted to reschedule  Patient was rescheduled in device clinic tomorrow at 10 am   Patient verbalized understanding of time and location of appointment tomorrow

## 2024-07-09 NOTE — Telephone Encounter (Signed)
 Pt called in wanting to know if he can come in today to get the alarm turned off since they are going to be in the building to get blood drawn. Pt was told that is okay and he can come in today

## 2024-07-10 ENCOUNTER — Ambulatory Visit: Admitting: Cardiology

## 2024-07-10 ENCOUNTER — Ambulatory Visit: Attending: Cardiology

## 2024-07-10 DIAGNOSIS — I472 Ventricular tachycardia, unspecified: Secondary | ICD-10-CM

## 2024-07-10 NOTE — Patient Instructions (Signed)
 Follow up as scheduled.

## 2024-07-10 NOTE — Progress Notes (Signed)
 Pt seen in device clinic to disable ERI alert.  Pt scheduled for gen change.

## 2024-07-11 ENCOUNTER — Ambulatory Visit: Admitting: Neurology

## 2024-07-11 VITALS — BP 130/82

## 2024-07-11 DIAGNOSIS — G243 Spasmodic torticollis: Secondary | ICD-10-CM

## 2024-07-11 MED ORDER — ONABOTULINUMTOXINA 100 UNITS IJ SOLR
100.0000 [IU] | Freq: Once | INTRAMUSCULAR | Status: AC
  Administered 2024-07-13: 100 [IU] via INTRAMUSCULAR

## 2024-07-11 NOTE — Progress Notes (Signed)
 "  ASSESSMENT AND PLAN 74 y.o. year old male   Anoxic brain injury in 1999 due to prolonged cardiac arrest, Slow worsening cognitive impairment  Slow worsening over the years, MMSE 23/30 in July 2025.  Continue Aricept  10 mg daily, Namenda  10 mg twice a day  Lab evaluation in 2025 showed slight elevation of TSH, on thyroid  supplement, otherwise no treatable etiology    Seizure  Most recent in June 2019, EEG was normal  Continue lamotrigine  100 mg twice a day  Gait abnormality, worsening neck pain, limited range of motion, abnormal neck posturing, mild anterocollis, right tilt, significant tenderness along cervical paraspinal muscles, worse on the right side   CT cervical spine in June 2023, multilevel degenerative changes, severe right-sided C1-C2 osteoarthritis, variable degree of foraminal narrowing, no significant canal stenosis  He failed epidural injection, complains of increased confusion dizziness, unsteadiness with tizanidine, tramadol treatment,  Responded well to low-dose Botox  injection, and physical therapy     EMG guided Botox  injection, used 100 units today (100 units dissolving to 2 cc of normal saline)  Right levator scapular 25 units  Right semispinalis 12.5 units Right splenius capitis 12.5 units Right splenius cervix 12.5 units    Left levator scapular 25 units  Left semispinalis 12.5 units Left splenius capitis 12.5 units Left splenius cervix 12.5 units   He tolerated injection well, will return to clinic in 3 months for repeat injection  DIAGNOSTIC DATA (LABS, IMAGING, TESTING) - I reviewed patient records, labs, notes, testing and imaging myself where available.   HISTORY OF PRESENT ILLNESS:  Larry [name] is a very friendly 74 year old right-handed gentleman presents for followup consultation of his memory loss in the context of anoxic brain injury.    He is accompanied by his wife today. He was a patient of Dr. Maurice, The patient has an underlying  medical history of cardiac death from ventricular fibrillation with hypoxic brain injury in Jan 1999 due to arrythmia, 15 minutes without heart beat,  status post defibrillator placement, He stayed in hospital for 35 days, required prolonged rehabilitation, has to relearn walking again, significant amnesia, as if that he did not have a past.   EEG showed diffuse slowing in January 1999, CT head and brain MRI were normal. CT head repeat in November 2000 showed slight prominence of the lateral and third ventricles. CT head in July 2003 showed prominence of the ventricular system. Psychological testing and August 1999 showed borderline performance IQ. He became depressed. In January 2014 his MMSE was 29, clock drawing was 4, animal fluency was 10.  He went to [facility] for 2 years for patient with degenerative disorder.   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 He lives at home with his wife, still driving.   He helps the household, he still has difficulty focusing.    He has had some fluctuation in his memory per wife. He has ADD and continues to take Adderall, 10 mg twice a day, without Adderall, he could not remember  the pages that he has read,he is also taking Aricept  10 mg every day, which has been very helpful too.   Up date April 14 2016YY   His overall doing very well, he likes play computer games, which has helped his eye and hands coordination, he had recent trip to [place], felt overwhelming, he continued to drive short distance,   He is taking Ritalin 10 mg twice  a day, Aricept  10 mg once a day, complains of insomnia, he took his last dose of Ritalin at evening time.   UPDATE Apr 21 2015:YY He is now taking Namenda  10mg  bid and aricept  10mg  qday, he still has trouble sleeping,  He watch movie, stay up late, he continues to have focus difficulty, tends to become agitated.   UPDATE April 27th 2017: YY He still  has chronic insomnia, tends to have irregular sleep pattern, stay up late watching TV, he still very active during the day, take Adderall 10 mg twice a day, still drives short distance, mild unsteady gait, tends to hold his left arm to his chest   UPDATE Oct 17th 2017:YY He is with his wife,  Her daughter was hospital, presented with sudden loss of consciousness yesterday April 19 2016, which has brought back a lot of his memory, he has not tried clonazepam  for chronic insomnia, worry about side effect, tried melatonin, still has significant insomnia, he is taking Tylenol  pm,    UPDATE March 02 2018: He was follow-up by Aleck in 2018, on December 19, 2017, visiting his daughter at [place] , in the morning time, he had sudden onset dizziness, feels like going to pass out, then had transient loss of consciousness, eyes rolled back, staring, lasting for 1 minutes, followed by postevent mild confusion and slurred speech last about 40 minutes, was treated at local hospital.   I personally reviewed CT head without contrast: There was no acute abnormality, generalized atrophy, ventriculomegaly,  laboratory evaluation, EKG showed no significant abnormality.   Similar spells in March 2019, transient dizziness followed by sudden loss of muscle tone, consciousness, extreme fatigue, slept for 1 hour afterwards,   Patient reported that he actually has frequent spells about once a month, he has transient confusion, feels like he is going to pass out, short lasting,   UPDATE Jun 05 2018: He tolerated lamotrigine  very well, taking 100 mg twice a day without significant side effect,   EEG was normal.  Since lamotrigine , he no longer has recurrent spells of rising sensation transient confusion even though there was no clinical seizure activity observed during those spells, he had those spells about once a week prior to lamotrigine ,  UPDATE Jun 19 2020: Is accompanied by his wife at today's clinical visit, slow  worsening memory loss, when he goes to a different room, he often forgets why is he there, slight worsening gait abnormality, he also complains of worsening tinnitus, difficulty sleeping sometimes,  I personally reviewed CT temporal bone w/wo in April 2021:  1. Negative CT appearance of the bilateral temporal bones. No explanation for hearing loss. 2. Note a mildly high riding right jugular bulb, with thin but intact overlying bony covering. 3. Hyperplastic but clear visible paranasal sinuses.  UPDATE March 29th 2022: He used to have headache even before his cardiac arrest, only occasionally, mild, his 2 daughter does has typical migraine headache,  Over the years, he has occasionally headache usually short lasting, on March 26, he complains of transient sharp pain shooting from the left parietal region downward, lasting for few seconds, then had a trickle of pressure headache, take ibuprofen, which did improve  Next morning March 27 Sunday, while at church, at the end of the sermon, he was noted to holding his left side, he also noticed transient blurry vision, was brought to the emergency room,  I personally reviewed CT head with contrast, no acute intracranial abnormality, no venous sinus thrombosis, arterial branch that was  visualized showed no significant abnormality  Laboratory showed normal ESR, negative troponin, normal CBC, BMP with exception of elevated glucose 140, was seen by ophthalmology today, no significant abnormality found, was told he has cataract, left side was slightly worse than the right  Update June 14th 2022: I reviewed his headache diary, few episode in March, transient sharp, only 1 episode on October 19, 2020, no longer has frequent headaches, reported worsening vision bilaterally, left worse than right, on today's near chart examination, visual acuity was 20/50 OS/OD,  Also complains of worsening gait abnormality, wife noticed worsening memory loss,  We personally  reviewed CT of temporal bone with without contrast in April 2021 for evaluation of his reported hearing loss, no acute abnormality, CT venogram of brain on September 28, 2020 that was normal  Update July 02, 2021: Is accompanied by his wife, continued slow worsening, increased memory loss, slow reaction time, continue to drive, wife has to be vigilant sitting at the passenger side, MoCA examination 22/30 today,  Over the past few months, he had few episode of transient ice pricking pain, not long-lasting, He also complains of back pain, rigidity, decreased range of motion  UPDATE December 21 2021: He continues to have slowing memory loss, MoCA examination 25/30, actually improved from previous memory of 22/30, he has slower reaction time, take time to figure out things,  He did have a history of obstructive sleep apnea, tried different facial mask, including nasal cannula in the past, could not tolerate it,  He also complains of worsening neck pain, stiffness, limited range of motion of his neck, somewhat improvement by physical therapy and dry needling, he denies bowel and bladder incontinence  Personally reviewed x-ray of cervical spine, mild to moderate multilevel cervical degenerative disc disease worse at C6-7  UPDATE Jun 15 2022: He is with his wife, complains of worsening short term memory loss, difficulty multitasking, feel frustrated easily,  He complains of worsening neck pain, difficulty turning his neck, 8 out of 10 constant achy pain, worsening shooting pain to right shoulder, occipital region moving his neck, especially looking to the left, underwent epidural injection without help his symptoms much.  UPDATE Aug 11 2022: Here for his first EMG guided Botox  a injection for severe neck pain, especially at the right side CT cervical spine June 2023, multilevel degenerative changes, severe right-sided C1-C2 osteoarthritis, tight right cervical paraspinal muscles, tenderness upon deep  palpitation,  UPDATE Nov 08 2022: Moderate improvement of his neck pain following his initial Botox  injection in February 2024, used 200 units, no ill effect noticed  Wife complains of worsening cognitive impairment, climbing up a ladder, almost fell  UPDATE Jan 28th 2025: Every 3 months low-dose Botox  injection did help his neck pain, no significant side effect, he tends to have right cervical paraspinal muscle tension, achy pain  Wife reported to him having worsening memory loss, gait abnormality, was not sure about the benefit of Aricept , Namenda , but tolerating it well  UPDATE April 28th 2025: He did well with previous injection, fell couple times, now with low back pain, doing physical therapy   UPDATE January 23 2024: Low-dose Botox  injection every 3 months has helped his neck pain, he continue has slowly worsening memory loss, Mini-Mental status examination 23/30  UPDATE Jan 7th 2026: Every 3 months injection has helped his neck pain  REVIEW OF SYSTEMS: Out of a complete 14 system review of symptoms, the patient complains only of the following symptoms, and all other reviewed systems are  negative.  Memory loss, seizures  PHYSICAL EXAM  Vitals:   06/15/22 1304  BP: (!) 142/89  Pulse: 78  Weight: 179 lb 6.4 oz (81.4 kg)  Height: 5' 9 (1.753 m)   Body mass index is 26.49 kg/m.  Generalized: Well developed, in no acute distress      01/23/2024   10:00 AM 10/03/2020    2:54 PM 12/11/2019    9:58 AM  MMSE - Mini Mental State Exam  Not completed:  Refused   Orientation to time 5  5  Orientation to Place 5  5  Registration 3  3  Attention/ Calculation 2  5  Recall 1  1  Language- name 2 objects 2  2  Language- repeat 1  1  Language- follow 3 step command 3  3  Language- read & follow direction 1  1  Write a sentence 0  1  Copy design 0  1  Copy design-comments   12 animals  Total score 23  28    NEUROLOGICAL EXAM:  Neck: Limited range of motion, of neck, looking  to either left or right, tenderness of cervical paraspinal muscles upon deep palpation, especially right levator scapula, upper and lower cervical regions, neck tilt to right shoulder, anterocollis,  MENTAL STATUS: Speech/Cognition: Slow reaction time time, cooperative on examination  CRANIAL NERVES: CN II: Visual fields are full to confrontation.  Pupils are small, reactive CN III, IV, VI: extraocular movement are normal. No ptosis. CN V: Facial sensation is intact to light touch. CN VII: Face is symmetric with normal eye closure and smile. CN VIII: Hearing is normal to casual conversation CN IX, X: Palate elevates symmetrically. Phonation is normal. CN XI: Head turning and shoulder shrug are intact  MOTOR:  Fixation left upper extremity on rapid rotating movement  REFLEXES: Reflexes are 2  and symmetric at the biceps, triceps, knees and ankles. Plantar responses are flexor.  SENSORY: Intact to light touch, pinprick, positional and vibratory sensation at fingers and toes.  COORDINATION: There is no trunk or limb ataxia.    GAIT/STANCE: He needs push-up to get up from seated position, tends to hold his left arm in elbow flexion, mildly wide-based, unsteady   ALLERGIES: Allergies  Allergen Reactions   Penicillins     Difficult Breathing, rash    HOME MEDICATIONS: Outpatient Medications Prior to Visit  Medication Sig Dispense Refill   amphetamine -dextroamphetamine  (ADDERALL) 10 MG tablet TAKE ONE TABLET BY MOUTH TWICE A DAY 180 tablet 0   Ascorbic Acid (VITAMIN C) 500 MG tablet Take 500 mg by mouth daily.     aspirin 81 MG tablet Take 81 mg by mouth daily.     botulinum toxin Type A  (BOTOX ) 200 units injection Inject 200 units into the muscles every 3 months 1 each 3   Cholecalciferol (VITAMIN D3) 10 MCG (400 UNIT) tablet Take 400 Units by mouth daily.     diltiazem  (CARDIZEM  CD) 120 MG 24 hr capsule Take 1 capsule (120 mg total) by mouth daily. 100 capsule 2   donepezil   (ARICEPT ) 10 MG tablet TAKE 1 TABLET BY MOUTH DAILY 100 tablet 2   fexofenadine (ALLEGRA) 180 MG tablet Take 180 mg by mouth daily.     Ginkgo Biloba 40 MG TABS Take by mouth as directed.     lamoTRIgine  (LAMICTAL ) 100 MG tablet TAKE 1 TABLET BY MOUTH TWICE  DAILY 200 tablet 2   levothyroxine  (SYNTHROID ) 150 MCG tablet Take 1 tablet (150 mcg total) by  mouth daily before breakfast. 90 tablet 3   Melatonin 5 MG CAPS Take by mouth as directed.     memantine  (NAMENDA ) 10 MG tablet TAKE 1 TABLET BY MOUTH TWICE  DAILY 200 tablet 2   metoprolol  succinate (TOPROL -XL) 25 MG 24 hr tablet Take 1 tablet (25 mg total) by mouth daily. Will need visit for further refills. 100 tablet 3   Multiple Vitamin (MULTIVITAMIN) tablet Take 700 tablets by mouth daily.     NON FORMULARY Focus factor 1 tab po bid     rosuvastatin  (CRESTOR ) 20 MG tablet Take 1 tablet (20 mg total) by mouth at bedtime. 100 tablet 3   tiZANidine (ZANAFLEX) 4 MG tablet Take 2-4 mg by mouth 2 (two) times daily as needed.     traMADol (ULTRAM) 50 MG tablet Take 50 mg by mouth 2 (two) times daily as needed.     vitamin B-12 (CYANOCOBALAMIN) 1000 MCG tablet Take 1,000 mcg by mouth daily.     vitamin E 180 MG (400 UNITS) capsule Take 400 Units by mouth daily.     montelukast  (SINGULAIR ) 10 MG tablet Take 1 tablet (10 mg total) by mouth daily. (Patient not taking: Reported on 07/11/2024) 100 tablet 3   No facility-administered medications prior to visit.    PAST MEDICAL HISTORY: Past Medical History:  Diagnosis Date   ADD (attention deficit disorder)    Allergy    seasonal flares of perinneal allergies   Cancer (HCC)    melanoma   Cardiac arrest (HCC) 07/1997   aborted   Dementia (HCC)    post resusitative   Depression    Diverticulosis    Family hx of prostate cancer    GERD (gastroesophageal reflux disease)    Headache(784.0)    Hyperlipidemia    Hypothyroidism    ICD (implantable cardiac defibrillator) in place    Idiopathic  urticaria    Internal hemorrhoids    Low back pain syndrome    Melanoma in situ of skin of trunk (HCC)    chest   PVC (premature ventricular contraction)    associated with cardiac arrest   Sleep apnea    no c-pap   Tubular adenoma of colon 10/2010    PAST SURGICAL HISTORY: Past Surgical History:  Procedure Laterality Date    implantation x2     CARDIAC DEFIBRILLATOR PLACEMENT  07/01/2010   Explanation of a previously implanted device, pocket revision, and insertion of a new device, and intraoperative defibrillation threshold testing Robyne)   CARDIAC DEFIBRILLATOR REMOVAL  07/05/2002   Defibrillator change Robyne) 3 times changed   CATARACT EXTRACTION W/PHACO Left 02/04/2021   Procedure: CATARACT EXTRACTION PHACO AND INTRAOCULAR LENS PLACEMENT (IOC) LEFT VIVITY toric LENS 9.21 01:11.6;  Surgeon: Mittie Gaskin, MD;  Location: [facility];  Service: Ophthalmology;  Laterality: Left;  keep patient at 11:30 arrival   CATARACT EXTRACTION W/PHACO Right 03/04/2021   Procedure: CATARACT EXTRACTION PHACO AND INTRAOCULAR LENS PLACEMENT (IOC) RIGHT VIVITY LENS 8.59 01:15.3;  Surgeon: Mittie Gaskin, MD;  Location: [facility];  Service: Ophthalmology;  Laterality: Right;   COLONOSCOPY     HEMORRHOID SURGERY     ICD     TONSILLECTOMY      FAMILY HISTORY: Family History  Problem Relation Age of Onset   Heart disease Mother    Hypertension Mother    Heart disease Father    Diverticulosis Father    Coronary artery disease Father    Alopecia Father    Prostate cancer  Brother    Diabetes Brother    Heart disease Brother        heart transplant   Lung cancer Paternal Grandfather        Smoker   Cancer Paternal Grandfather        lung cancer - smoker   Colon cancer Neg Hx    Esophageal cancer Neg Hx    Pancreatic cancer Neg Hx    Stomach cancer Neg Hx     SOCIAL HISTORY: Social History   Socioeconomic History   Marital status: Married    Spouse  name: Larry [name]   Number of children: 2   Years of education: College   Highest education level: Not on file  Occupational History    Employer: DISABLED  Tobacco Use   Smoking status: Former    Current packs/day: 0.00    Average packs/day: 1 pack/day for 10.0 years (10.0 ttl pk-yrs)    Types: Cigarettes    Start date: 07/05/1985    Quit date: 07/06/1995    Years since quitting: 29.0   Smokeless tobacco: Former    Types: Chew    Quit date: 11/04/1994  Vaping Use   Vaping status: Never Used  Substance and Sexual Activity   Alcohol use: No    Alcohol/week: 0.0 standard drinks of alcohol    Comment: socially   Drug use: No   Sexual activity: Not on file  Other Topics Concern   Not on file  Social History Narrative   Patient is married Larry [name]) 1974 with 2 daughters   5 grandchildren   1 great granddaughter.     Daily caffeine use - one cup of coffee and one-two cokes per day   Patient is right-handed.   Patient has a college education   Social Drivers of Health   Tobacco Use: Medium Risk (04/23/2024)   Received from [facility]   Patient History    Smoking Tobacco Use: Former    Smokeless Tobacco Use: Never    Passive Exposure: Never  Physicist, Medical Strain: Low Risk (10/11/2023)   Overall Financial Resource Strain (CARDIA)    Difficulty of Paying Living Expenses: Not hard at all  Food Insecurity: No Food Insecurity (10/11/2023)   Hunger Vital Sign    Worried About Running Out of Food in the Last Year: Never true    Ran Out of Food in the Last Year: Never true  Transportation Needs: No Transportation Needs (10/11/2023)   PRAPARE - Administrator, Civil Service (Medical): No    Lack of Transportation (Non-Medical): No  Physical Activity: Inactive (10/11/2023)   Exercise Vital Sign    Days of Exercise per Week: 0 days    Minutes of Exercise per Session: 0 min  Stress: No Stress Concern Present (10/11/2023)   [employer] of Occupational Health -  Occupational Stress Questionnaire    Feeling of Stress : Not at all  Social Connections: Socially Integrated (10/11/2023)   Social Connection and Isolation Panel    Frequency of Communication with Friends and Family: More than three times a week    Frequency of Social Gatherings with Friends and Family: More than three times a week    Attends Religious Services: More than 4 times per year    Active Member of [employer] or Organizations: Yes    Attends Banker Meetings: More than 4 times per year    Marital Status: Married  Catering Manager Violence: Not At Risk (10/11/2023)   Humiliation,  Afraid, Rape, and Kick questionnaire    Fear of Current or Ex-Partner: No    Emotionally Abused: No    Physically Abused: No    Sexually Abused: No  Depression (PHQ2-9): Medium Risk (11/11/2023)   Depression (PHQ2-9)    PHQ-2 Score: 7  Alcohol Screen: Low Risk (10/11/2023)   Alcohol Screen    Last Alcohol Screening Score (AUDIT): 0  Housing: Unknown (10/11/2023)   Housing Stability Vital Sign    Unable to Pay for Housing in the Last Year: No    Number of Times Moved in the Last Year: Not on file    Homeless in the Last Year: No  Utilities: Not At Risk (10/11/2023)   AHC Utilities    Threatened with loss of utilities: No  Health Literacy: Adequate Health Literacy (10/11/2023)   B1300 Health Literacy    Frequency of need for help with medical instructions: Never     Modena Callander, M.D. Ph.D.  [facility] [address] [place], [place] [address] Phone: [phone] Fax:      [phone] "

## 2024-07-11 NOTE — Progress Notes (Signed)
 Botox - 100 units x 1 vial  Lot: [id number] Expiration: 11/2025 NDC: 9976-8854-98  Bacteriostatic 0.9% Sodium Chloride - 2 mL  Lot: [id number] Expiration: MAR-31-2027 NDC: 9590-8033-97  Dx:G24.3 B/B Witnessed by: Jay Meissner, CMA

## 2024-07-12 ENCOUNTER — Telehealth (HOSPITAL_COMMUNITY): Payer: Self-pay

## 2024-07-12 NOTE — Telephone Encounter (Signed)
 Attempted to reach patient to discuss upcoming procedure, no answer. Left VM for patient to return call.

## 2024-07-13 DIAGNOSIS — G243 Spasmodic torticollis: Secondary | ICD-10-CM | POA: Diagnosis not present

## 2024-07-14 ENCOUNTER — Ambulatory Visit: Payer: Self-pay | Admitting: Cardiology

## 2024-07-18 ENCOUNTER — Encounter

## 2024-07-23 ENCOUNTER — Encounter

## 2024-07-26 ENCOUNTER — Ambulatory Visit

## 2024-07-30 NOTE — Pre-Procedure Instructions (Addendum)
 Spoke with patient's wife Nathanel, they are planning to come tomorrow.  I told her if that changes to please let us  know as soon as possible.    Instructed patient on the following items: Arrival time 1130 Nothing to eat or drink after midnight No meds AM of procedure Responsible person to drive you home and stay with you for 24 hrs Wash with special soap night before and morning of procedure

## 2024-07-31 ENCOUNTER — Ambulatory Visit (HOSPITAL_COMMUNITY)
Admission: RE | Admit: 2024-07-31 | Discharge: 2024-07-31 | Disposition: A | Discharge status: Home / Self Care | Attending: Cardiology | Admitting: Cardiology

## 2024-07-31 ENCOUNTER — Encounter (HOSPITAL_COMMUNITY)
Admission: RE | Disposition: A | Discharge status: Home / Self Care | Payer: Self-pay | Source: Home / Self Care | Attending: Cardiology

## 2024-07-31 ENCOUNTER — Other Ambulatory Visit: Payer: Self-pay

## 2024-07-31 DIAGNOSIS — Z4502 Encounter for adjustment and management of automatic implantable cardiac defibrillator: Secondary | ICD-10-CM | POA: Diagnosis present

## 2024-07-31 DIAGNOSIS — Z8674 Personal history of sudden cardiac arrest: Secondary | ICD-10-CM | POA: Diagnosis not present

## 2024-07-31 MED ORDER — ACETAMINOPHEN 325 MG PO TABS: 325.0000 mg | ORAL_TABLET | ORAL | Status: DC | PRN

## 2024-07-31 MED ORDER — CEFAZOLIN SODIUM-DEXTROSE 2-4 GM/100ML-% IV SOLN: 2.0000 g | INTRAVENOUS | Status: DC

## 2024-07-31 MED ORDER — VANCOMYCIN HCL IN DEXTROSE 1-5 GM/200ML-% IV SOLN: 1000.0000 mg | Freq: Once | INTRAVENOUS | Status: DC

## 2024-07-31 MED ORDER — MIDAZOLAM HCL 2 MG/2ML IJ SOLN
INTRAMUSCULAR | Status: AC
  Filled 2024-07-31: qty 2

## 2024-07-31 MED ORDER — LIDOCAINE HCL (PF) 1 % IJ SOLN
INTRAMUSCULAR | Status: AC
  Filled 2024-07-31: qty 60

## 2024-07-31 MED ORDER — MIDAZOLAM HCL 5 MG/5ML IJ SOLN
INTRAMUSCULAR | Status: DC | PRN
  Administered 2024-07-31: .5 mg via INTRAVENOUS
  Administered 2024-07-31: 1 mg via INTRAVENOUS

## 2024-07-31 MED ORDER — SODIUM CHLORIDE 0.9 % IV SOLN
80.0000 mg | INTRAVENOUS | Status: AC
  Administered 2024-07-31: 80 mg

## 2024-07-31 MED ORDER — LIDOCAINE HCL (PF) 1 % IJ SOLN
INTRAMUSCULAR | Status: DC | PRN
  Administered 2024-07-31: 45 mL

## 2024-07-31 MED ORDER — FENTANYL CITRATE (PF) 100 MCG/2ML IJ SOLN
INTRAMUSCULAR | Status: DC | PRN
  Administered 2024-07-31: 25 ug via INTRAVENOUS
  Administered 2024-07-31: 50 ug via INTRAVENOUS

## 2024-07-31 MED ORDER — POVIDONE-IODINE 10 % EX SWAB: 2.0000 | Freq: Once | CUTANEOUS | Status: DC

## 2024-07-31 MED ORDER — VANCOMYCIN HCL IN DEXTROSE 1-5 GM/200ML-% IV SOLN
INTRAVENOUS | Status: AC
  Administered 2024-07-31: 1000 mg
  Filled 2024-07-31: qty 200

## 2024-07-31 MED ORDER — SODIUM CHLORIDE 0.9 % IV SOLN
INTRAVENOUS | Status: AC
  Filled 2024-07-31: qty 2

## 2024-07-31 MED ORDER — SODIUM CHLORIDE 0.9 % IV SOLN: INTRAVENOUS | Status: DC

## 2024-07-31 MED ORDER — FENTANYL CITRATE (PF) 100 MCG/2ML IJ SOLN
INTRAMUSCULAR | Status: AC
  Filled 2024-07-31: qty 2

## 2024-07-31 MED ORDER — CHLORHEXIDINE GLUCONATE 4 % EX SOLN
4.0000 | Freq: Once | CUTANEOUS | Status: DC
  Filled 2024-07-31: qty 60

## 2024-07-31 MED ORDER — CEFAZOLIN SODIUM-DEXTROSE 2-4 GM/100ML-% IV SOLN
INTRAVENOUS | Status: AC
  Filled 2024-07-31: qty 100

## 2024-07-31 MED ORDER — ONDANSETRON HCL 4 MG/2ML IJ SOLN: 4.0000 mg | Freq: Four times a day (QID) | INTRAMUSCULAR | Status: DC | PRN

## 2024-07-31 NOTE — Discharge Instructions (Signed)

## 2024-07-31 NOTE — Progress Notes (Signed)
 Pt and wife received discharge instructions, teach back performed. Iv removed, no complications. Left chest site is clean dry intact, no signs of bleeding. Pt escorted out via wheelchair to wife's vehicle.

## 2024-07-31 NOTE — H&P (Signed)
 "       Electrophysiology Note    Date:  07/31/24  Patient ID:  Larry [name], Larry [name] [birth year], MRN [id number] PCP:  Cleatus Arlyss RAMAN, MD           Cardiologist:  Ozell Fell, MD     Electrophysiologist:  Fonda Kitty Electrophysiology APP:  Riddle, Suzann, NP      Discussed the use of AI scribe software for clinical note transcription with the patient, who gave verbal consent to proceed.   Patient Profile    Chief Complaint: routine ICD follow-up   History of Present Illness: Larry [name] is a 74 y.o. male with PMH notable for aborted cardiac arrest s/p ICD, PVC, HTN, HLD, hypothyroid; seen today for Larry ONEIDA HOLTS, MD (previously Dr. Fernande) for routine electrophysiology followup.    He last saw Dr. Fernande 08/2022 where he was doing well.    On follow-up today, he feels very well without cardiac complaints. He denies chest pain, chest pressure, palpitations. No SOB, edema or presyncope.  He recently completed PT for balance, not great at continuing exercises on his own.    His wife joins for appt.   Interval: Patient presents today for planned generator change. Reports feeling well. No new or acute complaints.     Arrhythmia/Device History Bos Sci single chamber ICD, imp 1999; Dx - cardiac arrest   - gen change 2011       ROS:  Please see the history of present illness. All other systems are reviewed and otherwise negative.      Physical Exam    Today's Vitals   07/31/24 1136 07/31/24 1155 07/31/24 1208  BP:   (!) 166/90  Pulse:   67  Resp:   18  Temp:  (P) 97.6 F (36.4 C) 97.6 F (36.4 C)  TempSrc:   Oral  SpO2:   100%  Weight:  (P) 77.1 kg 77.1 kg  Height:  (P) 5' 10 (1.778 m) 5' 10 (1.778 m)  PainSc: 0-No pain     Body mass index is 24.39 kg/m.  General: Well developed, in no acute distress.  Neck: No JVD.  Cardiac: Normal rate, regular rhythm.  Resp: Normal work of breathing.  Ext: No edema.  Neuro: No gross focal deficits.  Psych:  Normal affect.      Device interrogation done today and reviewed by myself:  Battery < 3mon until ERI Lead thresholds, impedence, sensing stable  Very brief NSVT episodes Low VP No changes made today     Studies Reviewed    Previous EP, cardiology notes.    EKG is ordered. Personal review of EKG from today shows:    EKG Interpretation Date/Time:                  Wednesday March 21 2024 13:40:32 EDT Ventricular Rate:         76 PR Interval:                 144 QRS Duration:             98 QT Interval:                 408 QTC Calculation:459 R Axis:                         16   Text Interpretation:Normal sinus rhythm Normal ECG Confirmed by Riddle, Suzann [phone]) on 03/21/2024 1:44:29 PM  Rhythm strip - SR with rare PVC   TTE, 08/17/2011 - Left ventricle: The cavity size was normal. Wall thickness    was normal. The estimated ejection fraction was 60%. Wall    motion was normal; there were no regional wall motion    abnormalities.  - Right ventricle: The cavity size was normal. Pacer wire or    catheter noted in right ventricle. Systolic function was    normal.          Assessment and Plan      #) Aborted cardiac arrest #) ICD in situ #) ICD at [facility] Risks, benefits, and alternatives to ICD pulse generator replacement were discussed in detail today.  The patient understands that risks include but are not limited to bleeding, infection, pneumothorax, perforation, tamponade, vascular damage, renal failure, MI, stroke, death, inappropriate shocks, damage to his existing leads, and lead dislodgement and wishes to proceed today.   Fonda Kitty, MD, [facility], [facility] Cardiac Electrophysiology      "

## 2024-08-01 ENCOUNTER — Encounter (HOSPITAL_COMMUNITY): Payer: Self-pay | Admitting: Cardiology

## 2024-08-01 MED FILL — Midazolam HCl Inj 2 MG/2ML (Base Equivalent): INTRAMUSCULAR | Qty: 1.5 | Status: AC

## 2024-08-02 ENCOUNTER — Encounter: Payer: Self-pay | Admitting: Emergency Medicine

## 2024-08-08 ENCOUNTER — Telehealth: Payer: Self-pay | Admitting: Neurology

## 2024-08-08 NOTE — Telephone Encounter (Signed)
 Pt had question abou t medication Neurodyne . Pt wanted to know if medication is good for him or not

## 2024-08-09 NOTE — Telephone Encounter (Signed)
 This is Dr. Onita patient.

## 2024-08-14 ENCOUNTER — Ambulatory Visit

## 2024-08-23 ENCOUNTER — Encounter

## 2024-08-26 ENCOUNTER — Ambulatory Visit

## 2024-09-11 ENCOUNTER — Ambulatory Visit

## 2024-09-24 ENCOUNTER — Ambulatory Visit

## 2024-10-03 ENCOUNTER — Ambulatory Visit: Admitting: Neurology

## 2024-10-11 ENCOUNTER — Ambulatory Visit

## 2024-10-12 ENCOUNTER — Ambulatory Visit

## 2024-10-17 ENCOUNTER — Encounter

## 2024-10-31 ENCOUNTER — Ambulatory Visit: Admitting: Cardiology

## 2024-12-11 ENCOUNTER — Ambulatory Visit

## 2024-12-24 ENCOUNTER — Ambulatory Visit

## 2025-03-12 ENCOUNTER — Ambulatory Visit

## 2025-03-25 ENCOUNTER — Ambulatory Visit

## 2025-06-11 ENCOUNTER — Ambulatory Visit

## 2025-06-24 ENCOUNTER — Ambulatory Visit

## 2025-09-10 ENCOUNTER — Ambulatory Visit

## 2025-09-23 ENCOUNTER — Ambulatory Visit
